# Patient Record
Sex: Male | Born: 1955
Health system: Southern US, Community
[De-identification: ages and names within clinical notes are randomized; demographics above are authoritative.]

## PROBLEM LIST (undated history)

## (undated) ENCOUNTER — Ambulatory Visit: Discharge status: Absent without leave | Payer: Managed Care, Other (non HMO)

## (undated) DIAGNOSIS — Z8489 Family history of other specified conditions: Secondary | ICD-10-CM

## (undated) DIAGNOSIS — Z951 Presence of aortocoronary bypass graft: Secondary | ICD-10-CM

## (undated) DIAGNOSIS — I739 Peripheral vascular disease, unspecified: Secondary | ICD-10-CM

## (undated) DIAGNOSIS — E119 Type 2 diabetes mellitus without complications: Secondary | ICD-10-CM

## (undated) DIAGNOSIS — C189 Malignant neoplasm of colon, unspecified: Secondary | ICD-10-CM

## (undated) DIAGNOSIS — I480 Paroxysmal atrial fibrillation: Secondary | ICD-10-CM

## (undated) DIAGNOSIS — K219 Gastro-esophageal reflux disease without esophagitis: Secondary | ICD-10-CM

## (undated) DIAGNOSIS — I10 Essential (primary) hypertension: Secondary | ICD-10-CM

## (undated) DIAGNOSIS — I350 Nonrheumatic aortic (valve) stenosis: Secondary | ICD-10-CM

## (undated) DIAGNOSIS — I25110 Atherosclerotic heart disease of native coronary artery with unstable angina pectoris: Secondary | ICD-10-CM

## (undated) DIAGNOSIS — I255 Ischemic cardiomyopathy: Secondary | ICD-10-CM

## (undated) DIAGNOSIS — T8859XA Other complications of anesthesia, initial encounter: Secondary | ICD-10-CM

## (undated) DIAGNOSIS — I340 Nonrheumatic mitral (valve) insufficiency: Secondary | ICD-10-CM

## (undated) DIAGNOSIS — I219 Acute myocardial infarction, unspecified: Secondary | ICD-10-CM

## (undated) DIAGNOSIS — E785 Hyperlipidemia, unspecified: Secondary | ICD-10-CM

## (undated) DIAGNOSIS — F1520 Other stimulant dependence, uncomplicated: Secondary | ICD-10-CM

## (undated) DIAGNOSIS — T4145XA Adverse effect of unspecified anesthetic, initial encounter: Secondary | ICD-10-CM

## (undated) HISTORY — DX: Malignant neoplasm of colon, unspecified: C18.9

## (undated) HISTORY — PX: CORONARY ARTERY BYPASS GRAFT: SHX141

## (undated) HISTORY — DX: Peripheral vascular disease, unspecified: I73.9

## (undated) HISTORY — DX: Hyperlipidemia, unspecified: E78.5

## (undated) HISTORY — DX: Ischemic cardiomyopathy: I25.5

## (undated) HISTORY — PX: OTHER SURGICAL HISTORY: SHX169

## (undated) HISTORY — DX: Paroxysmal atrial fibrillation: I48.0

---
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
## 2002-10-05 ENCOUNTER — Encounter: Payer: Self-pay | Admitting: Family Medicine

## 2002-10-05 ENCOUNTER — Ambulatory Visit (HOSPITAL_COMMUNITY): Admission: RE | Admit: 2002-10-05 | Discharge: 2002-10-05 | Payer: Self-pay | Admitting: Family Medicine

## 2002-11-25 ENCOUNTER — Ambulatory Visit (HOSPITAL_COMMUNITY): Admission: RE | Admit: 2002-11-25 | Discharge: 2002-11-25 | Payer: Self-pay | Admitting: Internal Medicine

## 2003-08-12 ENCOUNTER — Ambulatory Visit (HOSPITAL_COMMUNITY): Admission: RE | Admit: 2003-08-12 | Discharge: 2003-08-12 | Payer: Self-pay | Admitting: Family Medicine

## 2003-08-12 ENCOUNTER — Encounter: Payer: Self-pay | Admitting: Family Medicine

## 2007-02-25 ENCOUNTER — Ambulatory Visit (HOSPITAL_COMMUNITY): Admission: RE | Admit: 2007-02-25 | Discharge: 2007-02-25 | Payer: Self-pay | Admitting: *Deleted

## 2007-03-03 ENCOUNTER — Ambulatory Visit: Payer: Self-pay | Admitting: Surgery

## 2007-03-04 ENCOUNTER — Ambulatory Visit: Payer: Self-pay | Admitting: Surgery

## 2007-03-13 ENCOUNTER — Inpatient Hospital Stay (HOSPITAL_COMMUNITY): Admission: RE | Admit: 2007-03-13 | Discharge: 2007-03-22 | Payer: Self-pay | Admitting: Surgery

## 2007-03-13 ENCOUNTER — Ambulatory Visit: Payer: Self-pay | Admitting: Surgery

## 2007-04-08 ENCOUNTER — Encounter: Admission: RE | Admit: 2007-04-08 | Discharge: 2007-04-08 | Payer: Self-pay | Admitting: Surgery

## 2007-04-08 ENCOUNTER — Ambulatory Visit: Payer: Self-pay | Admitting: Surgery

## 2007-08-28 ENCOUNTER — Ambulatory Visit: Payer: Self-pay | Admitting: Surgery

## 2010-12-10 ENCOUNTER — Encounter: Payer: Self-pay | Admitting: Surgery

## 2011-04-03 NOTE — Assessment & Plan Note (Signed)
OFFICE VISIT   JUNEAU, Ian [name]  DOB:  February 18, 1956                                        August 28, 2007  CHART #:  [id number]   The patient had undergone coronary artery bypass grafting by Dr. Cyndia Bent  on 03/13/2007.  He had harvesting of the left radial artery at that  time.  Since surgery he has done very well and notes that he is now  walking up to 3 miles a day, and notes a definite improvement in his  overall physical status.  He noted yesterday a sharp stick in the mid  portion of  his left radial artery harvest site.  He came in to the  office today to have it checked.  On exam, a small metal clip has  protruded through the mid portion of his radial artery harvest site,  otherwise the site is well healed, there is no evidence of infection.   PHYSICAL EXAMINATION:  His blood pressure is 164/94, pulse is 112,  respiratory rate is 18, O2 sats 95%.  On exam his sternum is stable and  well healed.  As noted, he has a foreign body in the left forearm, a  small metal clip.  The arm was prepped with Betadine and draped  sterilely.  A 1% lidocaine was infiltrated locally and the clip was  removed without difficulty.  He was instructed in local wound care.   His blood pressure in the office today was slightly elevated.  I have  told him to make sure he is following up with his regular medical doctor  for close blood pressure monitoring.   Lanelle Bal, MD  Electronically Signed   EG/MEDQ  D:  08/28/2007  T:  08/28/2007  Job:  [id number]   cc:   Halford Chessman, M.D.  Richard A. Rollene Fare, M.D.

## 2011-04-06 NOTE — Consult Note (Signed)
NAME:  Ian [name], Ian [name]                        ACCOUNT NO.:  [id number]   MEDICAL RECORD NO.:  [id number]                   PATIENT TYPE:   LOCATION:                                       FACILITY:  [facility]   PHYSICIAN:  R. Garfield Cornea, M.D.              DATE OF BIRTH:  05-16-1956   DATE OF CONSULTATION:  10/20/2002  DATE OF DISCHARGE:                                   CONSULTATION   REASON FOR CONSULTATION:  Dysphagia, abnormal barium pill esophagram.   HISTORY OF PRESENT ILLNESS:  The patient is a 55 year old Caucasian  gentleman who presents today at the request of Dr. Orson Ape for further  evaluation of approximately one year's duration of dysphagia which has been  gradually worsening.  He has difficulty swallowing solids.  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  Estimated luminal diameter was 8-10 mm.  The patient also had typical heartburn symptoms and acid regurgitation for  which he takes over-the-counter antacids.  He has had these types of  regularly for at least two years.  Denies any nausea, vomiting, odynophagia,  abdominal pain, constipation, diarrhea, melena, or rectal bleeding.   CURRENT MEDICATIONS:  1. Glucotrol one b.i.d.  2. Lipitor 20 mg q.d.   ALLERGIES:  No known drug allergies.   PAST MEDICAL HISTORY:  1. Non-insulin-dependent diabetes mellitus.  2. Hypercholesterolemia.   PAST SURGICAL HISTORY:  None.   FAMILY HISTORY:  Brother died of lymphoma age 54.  No family history of  chronic GI illnesses or colorectal cancer.   SOCIAL HISTORY:  He has been married for 25 years and has three children.  He is employed with __________.  He quit smoking over 10 years ago.  Denies  any alcohol use.   REVIEW OF SYSTEMS:  Please see HPI for GI.  GENERAL:  Denies any weight  loss.  CARDIOPULMONARY:   Denies any chest pain, shortness of breath,  palpitations.   PHYSICAL EXAMINATION:  VITAL SIGNS:  Weight 209, height 5 feet 5 inches,  temperature 97.8, blood pressure 142/90, pulse 88.  GENERAL:  Pleasant, well-nourished, well-developed, moderately obese  Caucasian male in no acute distress.  SKIN:  Warm and dry.  No jaundice.  HEENT:  Pupils are equal, round, and reactive to light.  Conjunctivae are  pink.  Sclerae nonicteric.  Oropharyngeal mucosa moist and pink.  No  lesions, erythema, or exudate.  NECK:  No lymphadenopathy, thyromegaly, carotid bruits.  CHEST:  Lungs are clear to auscultation.  CARDIAC:  Regular rate and rhythm.  A 2/6 systolic ejection murmur heard  best in the upper precordium.  ABDOMEN:  Positive bowel sounds, obese, but symmetrical, soft, nontender.  No organomegaly.  There is a small, soft, round, mobile mass measuring about  the size of a dime in the right upper quadrant region consistent with a  lipoma.  He also has a small umbilical herniation which is easily reducible  and nontender.  EXTREMITIES:  No edema.   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  He has chronic gastroesophageal reflux disease symptoms and takes  over-the-counter antacids.  Suspect that he probably has a peptic stricture.  He needs an upper endoscopy with dilatation.  He also has a soft heart  murmur on examination today with no prior history.  Will provide SBE  prophylaxis.  I have asked him to follow up with Dr. Orson Ape regarding any  need for possible echocardiogram.   PLAN:  1. EGD with dilatation in near future.  2. Will provide SBE prophylaxis.  3. Begin Aciphex 20 mg p.o. q.d.  Four weeks of samples given.  4. The patient will follow up with Dr. Orson Ape with regards to heart murmur     and any need for possible echocardiogram.   I would like to thank Dr.  Orson Ape for allowing [place] to take part in the care  of this patient.     Neil Crouch, Richelle Ito, M.D.    LL/MEDQ  D:  10/20/2002  T:  10/20/2002  Job:  [id number]   cc:   Leonides Grills, M.D.  [address] [id number]  Fax: [phone]

## 2011-04-06 NOTE — Op Note (Signed)
NAMEMURVIN, Ian [name]              ACCOUNT NO.:  [id number]   MEDICAL RECORD NO.:  [id number]          PATIENT TYPE:  INP   LOCATION:  2314                         FACILITY:  [facility]   PHYSICIAN:  Gilford Raid, M.D.     DATE OF BIRTH:  1956-10-26   DATE OF PROCEDURE:  03/13/2007  DATE OF DISCHARGE:                               OPERATIVE REPORT   PREOPERATIVE DIAGNOSIS:  Severe three vessel coronary disease.   POSTOPERATIVE DIAGNOSIS:  Severe three vessel coronary disease.   OPERATIVE PROCEDURE:  Median sternotomy, extracorporeal circulation,  coronary artery bypass graft surgery x4 using a left internal mammary  artery graft to the left anterior descending coronary artery, with a  left radial artery graft to the posterior descending branch of the right  coronary, and saphenous vein graft to the second and third marginal  branches of the left circumflex coronary artery.  Endoscopic vein  harvesting from the left leg.   ATTENDING SURGEON:  Gilford Raid, M.D.   ASSISTANT:  John Giovanni, P.A.-C.   ANESTHESIA:  General endotracheal.   CLINICAL HISTORY:  This patient is a 55 year old gentleman with a  history of diabetes, hypertension, and hyperlipidemia as well as a  strong family history of coronary artery disease who has been very  active over the past year going to the gym 4-5 days a week and walking  on a treadmill.  He had no problems until about 6-8 weeks ago when he  reported exercising and developing aching in his arms bilaterally.  He  continued to have intermittent episodes over the next few weeks.  Given  his history, he underwent cardiac catheterization on February 26, 2007,  which showed significant three vessel disease.  The LAD had 90% proximal  stenosis as well as a 60% mid vessel stenosis.  There was a small  intermediate that had no significant disease.  The left circumflex is co-  dominant with a 60% ostial stenosis.  There was a second marginal branch  that had a  long 80% proximal stenosis.  There was a 40% stenosis in the  AV groove portion of the left circumflex before the third marginal or  posterolateral branch.  The right coronary had a nd 60% ostial stenosis  and a 70-80% mid vessel stenosis.  Left ventricular ejection fraction  was greater than 60% with no mitral regurgitation.  After review of the  angiogram and examination of the patient, it was felt that coronary  artery bypass graft surgery is the best treatment.  We obtained  preoperative arterial Dopplers of the upper extremities which showed  normal flow in the superficial palmar arch with radial and ulnar  compression.  I felt the best operation would be to use his left  internal mammary graft and a left radial artery graft as well as some  saphenous vein.  I did not use bilateral mammaries because of his  diabetes.   I discussed the operative procedure with the patient and his wife  including alternatives, benefits, and risks including but not limited to  bleeding, blood transfusion, infection, stroke, myocardial infarction,  graft  failure, paresthesias in the left arm from radial artery harvest,  organ failure, and death.  He understood and agreed to proceed.   OPERATIVE PROCEDURE:  The patient was taken to the operating room and  placed on the table in a supine position.  After induction of general  endotracheal anesthesia, a Foley catheter was placed in the bladder  using sterile technique.  The patient was positioned supine with the  left arm out on an arm board.  Then, the chest, abdomen and both lower  extremities as well as the left arm were prepped and draped in the  sterile field in the usual manner.  Then, the left radial artery was  harvested through a longitudinal incision over the artery.  Prior to  dividing the sub-branches, an atraumatic vascular clamp was placed  across the radial artery at the wrist and there was good Doppler flow  beyond the clamp indicating  good flow through the ulnar artery and the  superficial palmar arch.  I felt comfortable that the radial artery  could be harvested.  The side branches were then divided using the  harmonic scalpel.  The artery was ligated proximally and distally with a  2-0 silk suture ligature and divided.  It was flushed with papaverine  saline solution.  The side branches were all clipped.  This was a medium  caliber good quality vessel.  Then, there was complete hemostasis in the  wound.  The fascia was closed with continuous 2-0 Vicryl suture.  The  subcutaneous tissue was closed with continuous 2-0 Vicryl and the skin  with 3-0 Vicryl subcuticular skin closure.  The sponge, needle, and  instrument counts were correct at this point.  A dry sterile dressing  was applied over the incision.  The left arm was then repositioned at  the patient's left side.   Then, the chest was opened through a median sternotomy incision.  The  pericardium was opened in the midline.  Examination of the heart showed  good ventricular contractility.  The ascending aorta had no palpable  plaques in it.  Then, the left internal mammary artery was harvested  from the chest wall as a pedicle graft.  This was a medium caliber  vessel with excellent blood flow through it.  At the same time, a  segment of greater saphenous vein was harvested from the left leg using  endoscopic vein harvest technique.  This vein was of small to medium  caliber and good quality.  We initially examined the saphenous vein  adjacent to the right knee but this appeared to be very tiny and not  suitable.  I could not find any other vein present there.   Then, the patient was heparinized and when an adequate activated  clotting time was achieved, the distal ascending aorta was cannulated  using a 20-French aortic cannula for arterial inflow.  Venous outflow  was achieved using a two stage venous cannula through the right atrial appendage.  An antegrade  cardioplegia and vent cannula was inserted in  the aortic root.  The patient was placed on cardiopulmonary bypass and  distal coronaries identified.  The LAD was a large graftable vessel with  some proximal disease but no mid or distal disease.  The first marginal  branch or intermediate branch was relatively small.  The second marginal  branch was a large graftable vessel.  A third marginal branch was also a  graftable vessel.  The right coronary gave off a large posterior  descending branch and a very small posterolateral branch.   Then, the aorta was crossclamped and 500 mL of cold blood antegrade  cardioplegia was administered in the aortic root with quick arrest the  heart.  Systemic hypothermia to 28 degrees centigrade and topical  hypothermia with iced saline was used.  A temperature probe was placed  in the septum and insulating pad in the pericardium.  Additional doses  of cardioplegia were given at 20 minute intervals to maintain myocardial  temperature at around 10 degrees centigrade.   The first distal anastomosis was performed to the second obtuse marginal  branch.  The internal diameter was about 1.6 mm.  The conduit used was a  segment of greater saphenous vein and anastomosis performed in an end-to-  side manner using continuous 7-0 Prolene suture.  Flow was noted through  the graft and was excellent.   The second distal anastomosis was performed to the third marginal  branch.  The internal diameter of this vessel was also about 1.6 mm.  The conduit used was a second segment of greater saphenous vein and the  anastomosis was performed in an end-to-side manner continuous 7-0  Prolene suture.  Flow was noted through the graft and was excellent.   The third distal anastomosis was performed to the posterior descending  coronary.  The internal diameter of this vessel was about 1.75 mm.  The  conduit used was the left radial artery graft.  This was anastomosed in  an  end-to-side manner using continuous 8-0 Prolene suture.  Flow was  noted through the graft and was excellent.   The fourth distal anastomosis was performed to the distal portion of the  left anterior descending coronary.  The internal diameter of this vessel  was about 2 mm.  The conduit used was the left internal mammary graft.  This was brought through an opening in the left pericardium anterior to  the phrenic nerve.  It was anastomosed to the LAD in an end-to-side  manner using continuous 8-0 Prolene suture.  The pedicle was sutured to  the epicardium with 6-0 Prolene sutures.  The patient was rewarmed to 37  degrees centigrade.  With the crossclamp in place, the three proximal  anastomoses were performed directly to the aortic root in an end-to-side  manner using continuous 6-0 Prolene suture for the vein grafts and 7-0  Prolene for the radial artery graft.   Then, the clamp was removed from mammary pedicle.  There was rapid warming of the ventricular septum and return of spontaneous ventricular  fibrillation.  The crossclamp was removed with a time of 94 minutes.  The patient spontaneously converted to sinus rhythm.  The proximal and  distal anastomoses appeared hemostatic and the lie of the grafts  satisfactory.  Graft markers were placed around the proximal  anastomoses.  Two temporary right ventricular and right atrial pacing  wires were placed and brought through the skin.   When the patient had rewarmed to 37 degrees centigrade, he was weaned  from cardiopulmonary bypass on no inotropic agents.  Total bypass time  was 112 minutes.  Cardiac function appeared excellent with a cardiac  output of 5 liters per minute.  Protamine was given and the venous and  aortic cannulae were removed without difficulty.  Hemostasis was  achieved.  Three chest tubes were placed, two in the posterior  pericardium, one in the left pleural space, and one in the anterior  mediastinum.  The  pericardium was loosely  reapproximated over the heart.  The sternum was closed with #6 stainless wires.  The fascia was closed  with continuous #1 Vicryl suture.  The subcutaneous tissue was closed  with continuous 2-0 Vicryl and the skin with 3-0 Vicryl subcuticular  closure.  Lower extremity vein harvest sites were closed in layers in a  similar manner.  Sponge, needle and instrument counts were correct  according to the scrub nurse.  A dry sterile dressing was applied over  the incisions and around the chest tubes which were hooked to Pleur-Evac  suction.  The patient remained hemodynamically stable and was  transferred to the SICU in guarded but stable condition.      Gilford Raid, M.D.  Electronically Signed     BB/MEDQ  D:  03/13/2007  T:  03/13/2007  Job:  [id number]   cc:   Delfino Lovett A. Rollene Fare, M.D.

## 2011-04-06 NOTE — Discharge Summary (Signed)
Ian [name], Ian [name]              ACCOUNT NO.:  [id number]   MEDICAL RECORD NO.:  [id number]          PATIENT TYPE:  INP   LOCATION:  2016                         FACILITY:  [facility]   PHYSICIAN:  Gilford Raid, M.D.     DATE OF BIRTH:  1956/03/26   DATE OF ADMISSION:  03/13/2007  DATE OF DISCHARGE:  03/17/2007                               DISCHARGE SUMMARY   PRIMARY ADMITTING DIAGNOSIS:  Severe three-vessel coronary artery  disease.   ADDITIONAL/DISCHARGE DIAGNOSES:  1. Severe three-vessel coronary artery disease.  2. Type 2 diabetes mellitus.  3. Hypertension.  4. Hyperlipidemia.  5. Postoperative tachycardia.   PROCEDURES PERFORMED:  1. Coronary artery bypass grafting x4 (left internal mammary artery to      the LAD, left radial artery to the posterior descending, saphenous      vein graft sequentially to the second and third circumflex marginal      branches).  2. Endoscopic vein harvest, left leg.   HISTORY:  The patient is a 55 year old white male who 6-8 weeks ago  developed exercise intolerance and aching in his arms bilaterally.  This  continued intermittently, and he was seen by Dr. Gerarda Fraction who subsequently  referred him to Dr. Rollene Fare for further evaluation.  Because of his  symptoms as well as his family history of coronary artery disease and  his own past medical history of diabetes, hypertension, and  hyperlipidemia, he underwent cardiac catheterization on February 26, 2007  which showed severe three-vessel coronary artery disease with a left  ventricular ejection fraction of greater than 60%.  He was referred to  Dr. Gilford Raid for consideration of surgical revascularization.  Dr.  Cyndia Bent saw him as an outpatient and reviewed his coronary angiograms and  felt that his best course of treatment would be to proceed with surgical  revascularization at this time.  He explained the risks, benefits, and  alternatives of the procedure to the patient, and he agreed to  proceed  with surgery.   HOSPITAL COURSE:  He was admitted to [facility] on March 13, 2007.  He he was taken to the operating room and underwent CABG x4, as  described in detail above, performed by Dr. Cyndia Bent.  He tolerated the  procedure well and was transferred to the SICU in stable condition.  He  was able to be extubated shortly after surgery and was hemodynamically  stable and doing well on postop day #1.  He was able to be transferred  to the floor late in the day on postop day #1.  He was restarted on an  ACE inhibitor and beta-blocker for his history of hypertension and was  started on Imdur for his radial artery graft.  Once his p.o. intake had  improved, he was restarted on his home diabetes medications and in the  interim was maintained on Lantus.  He has had some problems with  tachycardia postoperatively, and his beta-blocker dose has been titrated  upward accordingly.  He is otherwise doing very well.  He is tolerating  a regular diet and is having normal bowel and bladder  function.  His  blood pressure has been well-controlled, and he has remained afebrile.  He has been diuresed back down to within 1 kg of his preoperative  weight.  He is ambulating the halls without difficulty.  His incisions  are all healing well.  It is felt that if he continues to remain stable  over the next 24 hours, he will hopefully be ready for discharge home on  March 17, 2007.   LABORATORY DATA:  His most recent labs showed a hemoglobin of 9.6,  hematocrit 27.3, white count 14.4, platelets 163.  Sodium 138, potassium  3.7 which has been replaced, BUN 13, creatinine 0.84.   DISCHARGE MEDICATIONS:  1. Aspirin 325 mg daily.  2. Lopressor 50 mg q.8h.  3. Altace 10 mg daily  4. Vytorin 10/80 mg daily.  5. Imdur 30 mg daily.  6. Protonix 40 mg daily.  7. ACTOplus met 15/500 b.i.d.  8. Tylox one to two q.4h. p.r.n. for pain.   DISCHARGE INSTRUCTIONS:  He is asked to refrain from  driving, heavy  lifting, or strenuous activity.  He may continue ambulating daily and  using his incentive spirometer.  He may shower daily and clean his  incisions with soap and water.  He will continue a low-fat, low-sodium  diabetic restricted diet.   DISCHARGE FOLLOWUP:  He will need to make an appointment to see Dr.  Rollene Fare in 2 weeks.  He will then follow up with Dr. Cyndia Bent in 3 weeks  and will have a chest x-ray prior to this appointment at [facility].  In the interim if he experiences problems or has  questions, he is asked to contact our office immediately.      Suzzanne Cloud, P.A.      Gilford Raid, M.D.  Electronically Signed    GC/MEDQ  D:  03/16/2007  T:  03/16/2007  Job:  [id number]   cc:   Sherrilee Gilles. Gerarda Fraction, MD  Richard A. Rollene Fare, M.D.

## 2011-04-06 NOTE — Discharge Summary (Signed)
Ian [name], Ian [name]              ACCOUNT NO.:  [id number]   MEDICAL RECORD NO.:  [id number]          PATIENT TYPE:  INP   LOCATION:  2016                         FACILITY:  [facility]   PHYSICIAN:  Gilford Raid, M.D.     DATE OF BIRTH:  1956-09-15   DATE OF ADMISSION:  03/13/2007  DATE OF DISCHARGE:  03/22/2007                               DISCHARGE SUMMARY   ADDENDUM   The patient was previously felt to be possibly ready for discharge on or  about March 17, 2007.  He, however, continued to have some elevation in  his heart rate and blood pressure.  This necessitated changing of his  medical regimen.  His beta-blocker was increased and additionally he was  started on Cardizem.  He remained at a baseline tachycardia even at  rest.  TSH was checked and this was within normal range.  The patient  was felt possibly to require consideration for evaluation for possible  pheochromocytoma due to the persistent tachycardia and elevation in his  blood pressure.  He has had a 24-hour catecholamine and metanephrine  tests and results are currently pending at this time.  Additionally, he  has had a CT scan and this reveals no adrenal masses.  His heart rate  has improved somewhat.  His blood pressure is also more stable.  His  overall status is felt tentatively to be stable for discharge in the  morning of Mar 22, 2007.   DISCHARGE MEDICATIONS:  1. Aspirin 325 mg daily.  2. Lopressor 100 mg b.i.d.  3. Altace 10 mg daily.  4. Vytorin 10/80 one daily.  5. Protonix 40 mg daily.  6. Actos twice daily.  7. Cardizem CD 240 mg daily.   SPECIAL INSTRUCTIONS:  The patient received written instructions  regarding medications, activity, diet, wound care and follow-up.   FOLLOW UP:  Followup will be with Dr. Cyndia Bent in 3 weeks and an  appointment will be called to the patient.  Additionally, he is  instructed to obtain a chest x-ray and see Dr. Rollene Fare in 2 weeks.   CONDITION ON DISCHARGE:  Stable,  improving.   DISCHARGE DIAGNOSES:  1. Severe coronary artery disease, three-vessel, now status post      revascularization.  2. Mild postoperative anemia most recent hematocrit 32% on March 19, 2007.  3. Adult-onset diabetes.  4. Hypertension.  5. Hyperlipidemia.  6. Persistent tachycardia of uncertain etiology.  7. Exogenous obesity.   For further details of this hospitalization, please see the previously  dictated summary.      John Giovanni, P.A.-C.      Gilford Raid, M.D.  Electronically Signed    WEG/MEDQ  D:  03/21/2007  T:  03/21/2007  Job:  [id number]   cc:   Delfino Lovett A. Rollene Fare, M.D.

## 2011-04-06 NOTE — Op Note (Signed)
NAME:  [name], Ian L                        ACCOUNT NO.:  [id number]   MEDICAL RECORD NO.:  [id number]                   PATIENT TYPE:  AMB   LOCATION:  DAY                                  FACILITY:  [facility]   PHYSICIAN:  R. Garfield Cornea, M.D.              DATE OF BIRTH:  23-May-1956   DATE OF PROCEDURE:  11/25/2002  DATE OF DISCHARGE:                                 OPERATIVE REPORT   PROCEDURE PERFORMED:  Esophagogastroduodenoscopy.   ENDOSCOPIST:  Bridgette Habermann, M.D.   INDICATIONS FOR PROCEDURE:  The patient is a 55 year old gentleman with a  one-year history of esophageal dysphagia.  Barium-filled esophagogram  demonstrated a stricture in the distal thoracic esophagus and he has  frequent reflux symptoms.  He was started on Aciphex 20 mg orally daily  recently. He was also found to have a systolic ejection murmur.  He is  seeing Dr. Everette Rank for consideration of echocardiogram.  EGD with possible  esophageal dilation as a prep was discussed with the patient at length.  Potential risks, benefits and alternatives have been reviewed and questions  answered.  Please see documentation in medical record [facility]AT.  Because he does  have a systolic murmur, he did receive SBE prophylactic antibiotics.   DESCRIPTION OF PROCEDURE:  Oxygen saturations, blood pressure, pulse and  respirations were monitored entirety of the procedure and conscious sedation  with Versed 3 mg IV and Demerol 75 mg IV, Cetacaine spray for topical  oropharyngeal anesthesia.  Ampicillin 2 gm IV, gentamicin 120 mg IV prior to  the procedure.   INSTRUMENT USED:  Olympus video gastroscope.   FINDINGS:  Examination of tubular esophagus revealed Schatzki's' ring.  There was a single distal esophageal erosion.  There was no evidence of  Barrett's esophagus or other mucosal abnormality.  EG junction was easily  traversed _______. Gastric cavity was emptied and insufflated well with air.  Thorough examination of  gastric mucosa including a retroflexed view of the  proximal stomach, esophagogastric junction demonstrated two 5 mm prepyloric  ulcers with surrounding superficial erosions.  There did not appear to be an  infiltrating process.  The pylorus was patent and easily traversed.   Duodenum:  Examination of the bulb and second portion revealed similar  findings as in the antrum with erosions only in the bulb.  There was no  ulcer.  D2 appeared normal.   THERAPY/DIAGNOSTIC MANEUVERS PERFORMED:  1. A 46 French ______ dilator was passed for insertion without resistance,     without blood in the dilator.  Looking back revealed the ring had been     ruptured with a slight amount of blood at the EG junction.  There were no     apparent complications related to passage of the dilator. Subsequently     one of the ulcers in the antrum was biopsied for histologic study.   The patient tolerated the procedure well and  was reacted in endoscopy.    IMPRESSION:  1. Prominent Schatzki's' ring, status post dilation as described above.     Single distal esophageal erosion consistent with erosive reflux     esophagitis.  2. Two small prepyloric ulcers with surrounding erosions of uncertain     significance biopsied.  They appeared to be benign lesions. The remainder     of the gastric mucosa appeared normal.  3. Bulbar erosions otherwise bulb and second portion appeared normal.   RECOMMENDATIONS:  1. Continue Aciphex 20 mg orally every day.  2. Follow-up on path.  3. Helicobacter pylori serologies.  4. Further recommendations to follow.                                                Bridgette Habermann, M.D.    RMR/MEDQ  D:  11/25/2002  T:  11/25/2002  Job:  [id number]   cc:   Leonides Grills, M.D.  [address] [id number]  Fax: [phone]

## 2011-09-03 ENCOUNTER — Telehealth: Payer: Self-pay

## 2011-09-03 NOTE — Telephone Encounter (Signed)
Called, many rings and no answer.

## 2011-09-04 NOTE — Telephone Encounter (Signed)
Called, line busy.  

## 2011-09-05 NOTE — Telephone Encounter (Signed)
Letter mailed to pt to call.  

## 2011-12-14 ENCOUNTER — Telehealth: Payer: Self-pay

## 2011-12-14 NOTE — Telephone Encounter (Signed)
Called, many rings and no answer.

## 2011-12-17 NOTE — Telephone Encounter (Signed)
Called, many rings and no answer. Mailing a letter to call.  

## 2013-01-09 ENCOUNTER — Encounter (HOSPITAL_COMMUNITY): Payer: Self-pay | Admitting: *Deleted

## 2013-01-09 ENCOUNTER — Observation Stay (HOSPITAL_COMMUNITY)
Admission: EM | Admit: 2013-01-09 | Discharge: 2013-01-10 | Disposition: A | Discharge status: Home / Self Care | Payer: Managed Care, Other (non HMO) | Attending: Internal Medicine | Admitting: Internal Medicine

## 2013-01-09 DIAGNOSIS — G4733 Obstructive sleep apnea (adult) (pediatric): Secondary | ICD-10-CM | POA: Diagnosis present

## 2013-01-09 DIAGNOSIS — R110 Nausea: Secondary | ICD-10-CM

## 2013-01-09 DIAGNOSIS — Z951 Presence of aortocoronary bypass graft: Secondary | ICD-10-CM

## 2013-01-09 DIAGNOSIS — I2510 Atherosclerotic heart disease of native coronary artery without angina pectoris: Secondary | ICD-10-CM

## 2013-01-09 DIAGNOSIS — D72829 Elevated white blood cell count, unspecified: Secondary | ICD-10-CM | POA: Insufficient documentation

## 2013-01-09 DIAGNOSIS — R002 Palpitations: Principal | ICD-10-CM

## 2013-01-09 DIAGNOSIS — E119 Type 2 diabetes mellitus without complications: Secondary | ICD-10-CM

## 2013-01-09 DIAGNOSIS — M79609 Pain in unspecified limb: Secondary | ICD-10-CM | POA: Insufficient documentation

## 2013-01-09 DIAGNOSIS — F1520 Other stimulant dependence, uncomplicated: Secondary | ICD-10-CM

## 2013-01-09 DIAGNOSIS — R0789 Other chest pain: Secondary | ICD-10-CM

## 2013-01-09 DIAGNOSIS — E785 Hyperlipidemia, unspecified: Secondary | ICD-10-CM

## 2013-01-09 DIAGNOSIS — R61 Generalized hyperhidrosis: Secondary | ICD-10-CM

## 2013-01-09 DIAGNOSIS — E669 Obesity, unspecified: Secondary | ICD-10-CM

## 2013-01-09 DIAGNOSIS — I10 Essential (primary) hypertension: Secondary | ICD-10-CM

## 2013-01-09 HISTORY — DX: Other stimulant dependence, uncomplicated: F15.20

## 2013-01-09 HISTORY — DX: Presence of aortocoronary bypass graft: Z95.1

## 2013-01-09 HISTORY — DX: Type 2 diabetes mellitus without complications: E11.9

## 2013-01-09 HISTORY — DX: Essential (primary) hypertension: I10

## 2013-01-09 NOTE — ED Notes (Signed)
Pt c/o feeling diaphoretic and feels as if his heart is fluttering x 1 hr.

## 2013-01-10 ENCOUNTER — Encounter (HOSPITAL_COMMUNITY): Payer: Self-pay | Admitting: Internal Medicine

## 2013-01-10 ENCOUNTER — Emergency Department (HOSPITAL_COMMUNITY): Payer: Managed Care, Other (non HMO)

## 2013-01-10 DIAGNOSIS — R002 Palpitations: Secondary | ICD-10-CM

## 2013-01-10 DIAGNOSIS — E1169 Type 2 diabetes mellitus with other specified complication: Secondary | ICD-10-CM | POA: Diagnosis present

## 2013-01-10 DIAGNOSIS — I25110 Atherosclerotic heart disease of native coronary artery with unstable angina pectoris: Secondary | ICD-10-CM

## 2013-01-10 DIAGNOSIS — I10 Essential (primary) hypertension: Secondary | ICD-10-CM

## 2013-01-10 DIAGNOSIS — R0789 Other chest pain: Secondary | ICD-10-CM

## 2013-01-10 DIAGNOSIS — F1520 Other stimulant dependence, uncomplicated: Secondary | ICD-10-CM

## 2013-01-10 DIAGNOSIS — E669 Obesity, unspecified: Secondary | ICD-10-CM

## 2013-01-10 DIAGNOSIS — E1165 Type 2 diabetes mellitus with hyperglycemia: Secondary | ICD-10-CM | POA: Diagnosis present

## 2013-01-10 DIAGNOSIS — E119 Type 2 diabetes mellitus without complications: Secondary | ICD-10-CM

## 2013-01-10 DIAGNOSIS — G4733 Obstructive sleep apnea (adult) (pediatric): Secondary | ICD-10-CM | POA: Diagnosis present

## 2013-01-10 HISTORY — DX: Atherosclerotic heart disease of native coronary artery with unstable angina pectoris: I25.110

## 2013-01-10 HISTORY — DX: Other stimulant dependence, uncomplicated: F15.20

## 2013-01-10 LAB — TROPONIN I
Troponin I: <0.3 ng/mL (ref <0.30)
Troponin I: <0.3 ng/mL (ref <0.30)

## 2013-01-10 LAB — CBC WITH DIFFERENTIAL/PLATELET
Basophils Absolute: 0 10*3/uL (ref 0.0–0.1)
Eosinophils Relative: 1 % (ref 0–5)
Lymphocytes Relative: 26 % (ref 12–46)
Lymphs Abs: 3.1 10*3/uL (ref 0.7–4.0)
Neutro Abs: 7.7 10*3/uL (ref 1.7–7.7)
Neutrophils Relative %: 64 % (ref 43–77)
Platelets: 282 10*3/uL (ref 150–400)
RBC: 4.62 MIL/uL (ref 4.22–5.81)
RDW: 13.1 % (ref 11.5–15.5)
WBC: 12.2 10*3/uL — ABNORMAL HIGH (ref 4.0–10.5)

## 2013-01-10 LAB — BASIC METABOLIC PANEL
CO2: 28 mEq/L (ref 19–32)
Calcium: 10 mg/dL (ref 8.4–10.5)
Chloride: 100 mEq/L (ref 96–112)
Potassium: 3.7 mEq/L (ref 3.5–5.1)
Sodium: 141 mEq/L (ref 135–145)

## 2013-01-10 LAB — GLUCOSE, CAPILLARY: Glucose-Capillary: 138 mg/dL — ABNORMAL HIGH (ref 70–99)

## 2013-01-10 LAB — HEPATIC FUNCTION PANEL
ALT: 22 U/L (ref 0–53)
AST: 17 U/L (ref 0–37)
Alkaline Phosphatase: 88 U/L (ref 39–117)
Bilirubin, Direct: 0.1 mg/dL (ref 0.0–0.3)

## 2013-01-10 LAB — HEMOGLOBIN A1C: Mean Plasma Glucose: 186 mg/dL — ABNORMAL HIGH (ref <117)

## 2013-01-10 MED ORDER — INSULIN ASPART 100 UNIT/ML ~~LOC~~ SOLN: 0.0000 [IU] | Freq: Every day | SUBCUTANEOUS | Status: DC

## 2013-01-10 MED ORDER — ONDANSETRON HCL 4 MG/2ML IJ SOLN: 4.0000 mg | INTRAMUSCULAR | Status: DC | PRN

## 2013-01-10 MED ORDER — NIACIN ER (ANTIHYPERLIPIDEMIC) 500 MG PO TBCR
500.0000 mg | EXTENDED_RELEASE_TABLET | Freq: Every day | ORAL | Status: DC
  Filled 2013-01-10 (×2): qty 1

## 2013-01-10 MED ORDER — NITROGLYCERIN 0.4 MG SL SUBL: 0.4000 mg | SUBLINGUAL_TABLET | SUBLINGUAL | Status: DC | PRN

## 2013-01-10 MED ORDER — ASPIRIN EC 81 MG PO TBEC
81.0000 mg | DELAYED_RELEASE_TABLET | Freq: Every day | ORAL | Status: DC
  Administered 2013-01-10: 81 mg via ORAL
  Filled 2013-01-10: qty 1

## 2013-01-10 MED ORDER — RAMIPRIL 10 MG PO CAPS
10.0000 mg | ORAL_CAPSULE | Freq: Every day | ORAL | Status: DC
  Administered 2013-01-10: 10 mg via ORAL
  Filled 2013-01-10: qty 1

## 2013-01-10 MED ORDER — METOPROLOL TARTRATE 1 MG/ML IV SOLN: 5.0000 mg | Freq: Four times a day (QID) | INTRAVENOUS | Status: DC | PRN

## 2013-01-10 MED ORDER — EZETIMIBE 10 MG PO TABS: 10.0000 mg | ORAL_TABLET | Freq: Every day | ORAL | Status: DC

## 2013-01-10 MED ORDER — METOPROLOL TARTRATE 50 MG PO TABS
50.0000 mg | ORAL_TABLET | Freq: Two times a day (BID) | ORAL | Status: DC
  Administered 2013-01-10: 50 mg via ORAL
  Filled 2013-01-10: qty 1

## 2013-01-10 MED ORDER — POLYETHYLENE GLYCOL 3350 17 G PO PACK: 17.0000 g | PACK | Freq: Every day | ORAL | Status: DC | PRN

## 2013-01-10 MED ORDER — SORBITOL 70 % SOLN: 30.0000 mL | Freq: Every day | Status: DC | PRN

## 2013-01-10 MED ORDER — SIMVASTATIN 20 MG PO TABS: 80.0000 mg | ORAL_TABLET | Freq: Every day | ORAL | Status: DC

## 2013-01-10 MED ORDER — PANTOPRAZOLE SODIUM 40 MG PO TBEC
40.0000 mg | DELAYED_RELEASE_TABLET | Freq: Every day | ORAL | Status: DC
  Administered 2013-01-10: 40 mg via ORAL
  Filled 2013-01-10: qty 1

## 2013-01-10 MED ORDER — INSULIN ASPART 100 UNIT/ML ~~LOC~~ SOLN
0.0000 [IU] | Freq: Three times a day (TID) | SUBCUTANEOUS | Status: DC
  Administered 2013-01-10 (×3): 1 [IU] via SUBCUTANEOUS

## 2013-01-10 MED ORDER — EZETIMIBE-SIMVASTATIN 10-80 MG PO TABS: 1.0000 | ORAL_TABLET | Freq: Every day | ORAL | Status: DC

## 2013-01-10 MED ORDER — ENOXAPARIN SODIUM 40 MG/0.4ML ~~LOC~~ SOLN
40.0000 mg | SUBCUTANEOUS | Status: DC
  Administered 2013-01-10: 40 mg via SUBCUTANEOUS
  Filled 2013-01-10: qty 0.4

## 2013-01-10 MED ORDER — SODIUM CHLORIDE 0.9 % IJ SOLN
3.0000 mL | Freq: Two times a day (BID) | INTRAMUSCULAR | Status: DC
  Administered 2013-01-10: 3 mL via INTRAVENOUS

## 2013-01-10 MED ORDER — ASPIRIN EC 81 MG PO TBEC: 81.0000 mg | DELAYED_RELEASE_TABLET | Freq: Every day | ORAL | Status: DC

## 2013-01-10 MED ORDER — ACETAMINOPHEN 325 MG PO TABS: 650.0000 mg | ORAL_TABLET | ORAL | Status: DC | PRN

## 2013-01-10 MED ORDER — INSULIN GLARGINE 100 UNIT/ML ~~LOC~~ SOLN: 50.0000 [IU] | Freq: Every day | SUBCUTANEOUS | Status: DC

## 2013-01-10 NOTE — Discharge Summary (Signed)
Physician Discharge Summary  Ian [name] [id number] DOB: 01-03-1956 DOA: 01/09/2013  PCP: Leonides Grills, MD  Admit date: 01/09/2013 Discharge date: 01/10/2013  Time spent: Greater than 30 minutes  Recommendations for Outpatient Follow-up:  1. The patient was instructed on decreasing his caffeine intake. 2. The patient was instructed to avoid skipping doses of diltiazem and metoprolol XL. He was advised to hold ramipril and hydrochlorothiazide if he felt that his blood pressure was too low or if he felt dizzy when he stood up. 3. The patient would benefit from an outpatient sleep study and Holter monitoring This will be deferred to his primary care providers. 4. TSH result pending at the time of discharge.  Discharge Diagnoses:  1. Recurrent palpitations. Differential diagnoses included excessive caffeine consumption and rebound tachyarrhythmia from missing a dose of metoprolol XL. Other etiologies entertained included hypoglycemia though not confirmed and obstructive sleep apnea though not confirmed. 2. Chest tightness with associated left arm numbness. Myocardial infarction ruled out. 3. Coronary artery disease, status post CABG in 2008. 4. Hypertension. Stable. 5. Possible obstructive sleep apnea, but not definitive given no diagnostic sleep study. 6. Type 2 diabetes mellitus. 7. Hyperlipidemia. 8. Mild leukocytosis with no evidence of infection. 9. Excessive caffeine intake, equivalent to 2 pots of coffee daily.   Discharge Condition: Improved.  Diet recommendation: Heart healthy and carbohydrate modified.  Filed Weights   01/09/13 2338  Weight: 92.987 kg (205 lb)    History of present illness:  The patient is a 57 year old man with a history significant for coronary artery disease, status post CABG in 2008, hypertension, hyperlipidemia, and type 2 diabetes mellitus, who presented to the emergency department on 01/09/2013 with a chief complaint of chest palpitations,  transient chest tightness, left arm numbness, shortness of breath, and sweatiness. In the emergency department, he was afebrile and hemodynamically stable. He was oxygenating 97% on room air. His heart rate was in the 80s. His blood pressure was 167/103. His EKG revealed normal sinus rhythm with a heart rate of 91 beats per minute, but no ST or T wave abnormalities and no PVCs or PACs. His chest x-ray revealed no acute cardiopulmonary disease. His lab data were significant for a normal troponin I., glucose of 189, and WBC of 12.2. He was admitted for further evaluation and management.  Hospital Course:  During the initial assessment, his home medications were not confirmed. It was believed that he had been taking metoprolol once a day at home. Therefore, the dose of metoprolol was increased to 50 mg twice a day to ameliorate his palpitations.. However, once the pharmacy tech confirmed his home medications with the pharmacy and with his wife, it was noted that he takes metoprolol XL 75 mg twice a day and diltiazem 240 mg daily at home. In addition, he takes hydrochlorothiazide and ramipril for treatment of hypertension. He has a history of persistent tachycardia dating back to 2008 following his CABG and was therefore started on treatment with diltiazem and metoprolol at that time. The patient was subtotally restarted on metoprolol. For further evaluation of his symptoms, a number of studies were ordered. His serum potassium was within normal limits at 3.7. His blood magnesium level was within normal limits at 2.1. His troponin I was negative x3. The result of his TSH was pending at the time of discharge. His followup EKG was normal, revealing normal sinus rhythm without any ST or T wave abnormalities and with a heart rate of 87 beats per minute.  During the  hospitalization, per telemetry, there were no tachyarrhythmias. The patient's chest pain and palpitations resolved.  The etiology of his perceived  palpitations was not clearly elucidated. However the differential diagnoses included transient hypoxia from obstructive sleep apnea (the patient has a history of snoring), hypoglycemia (his capillary blood glucose was nearly 200 on admission ) or rebound tachyarrhythmia because he had missed taking one dose of metoprolol XL. The most plausible etiology was the patient's recent increase in caffeine consumption. Per the patient and his wife, he drinks more than 2 pots of coffee in excess of 80 ounces or more daily.  The patient was instructed to decrease the intake of his coffee consumption by 1 cup every other day until he was down to approximately one half of what he had been drinking. Further weaning of caffeine can be undertaken later. He was also instructed to avoid skipping or missing doses of metoprolol and diltiazem particularly. His blood pressures were normal during the hospitalization, although his blood pressure was elevated in the ED. He reports feeling dizzy at home sometimes when he stands up which could be secondary to relative hypotension from taking 4 antihypertensive medications. He was instructed to hold or not take ramipril and hydrochlorothiazide if he felt that his blood pressure was too low. Both he and his wife voiced understanding. He was also advised to check his blood sugars at night if he felt palpitations again to assess for hypoglycemia.   Procedures:  None  Consultations:  None  Discharge Exam: Filed Vitals:   01/10/13 0300 01/10/13 0400 01/10/13 0532 01/10/13 1405  BP: 134/75 150/84 148/80 114/74  Pulse: 88 82 80 89  Temp:   97.5 F (36.4 C) 97.9 F (36.6 C)  TempSrc:   Oral   Resp: 20 19 20 20   Height:      Weight:      SpO2: 95% 93% 99% 96%    General: Pleasant 57 year old Caucasian man sitting up in bed, in no acute distress. Cardiovascular: S1, S2, with a soft systolic murmur. Respiratory: Clear to auscultation bilaterally.  Discharge  Instructions      Discharge Orders   Future Orders Complete By Expires     Diet - low sodium heart healthy  As directed     Diet - low sodium heart healthy  As directed     Diet Carb Modified  As directed     Diet Carb Modified  As directed     Discharge instructions  As directed     Comments:      Avoid missing doses of diltiazem and metoprolol XL. If you feel dizzy when you stand or if your systolic blood pressure (top number) is less than 120, do not take ramipril or hydrochlorothiazide but continue your other blood pressure medications until you see your primary care doctor or your cardiologist. If you do not feel dizzy when you stand and if your systolic blood pressure is greater than 120, continue all 4 of your blood pressure medications. Decrease the intake of your coffee by decreasing 8 ounces every other day until you're drinking approximately half of what you are currently drinking. Followup with your cardiologist in one to 2 weeks.    Discharge instructions  As directed     Comments:      Check your blood sugar when palpitations occur again to make sure that it is not too low.    Increase activity slowly  As directed     Increase activity slowly  As directed  Medication List    TAKE these medications       aspirin EC 81 MG tablet  Take 1 tablet (81 mg total) by mouth daily.     CoQ-10 100 MG Caps  Take 1 capsule by mouth daily.     diltiazem 240 MG 24 hr capsule  Commonly known as:  CARDIZEM CD  Take 240 mg by mouth daily.     ezetimibe-simvastatin 10-80 MG per tablet  Commonly known as:  VYTORIN  Take 1 tablet by mouth at bedtime.     fish oil-omega-3 fatty acids 1000 MG capsule  Take 1 g by mouth daily.     hydrochlorothiazide 12.5 MG capsule  Commonly known as:  MICROZIDE  Take 12.5 mg by mouth daily.     insulin lispro 100 UNIT/ML injection  Commonly known as:  HUMALOG  Inject 0-15 Units into the skin 3 (three) times daily before meals. Sliding  Scale     LANTUS SOLOSTAR 100 UNIT/ML injection  Generic drug:  insulin glargine  Inject 50 Units into the skin at bedtime.     metoprolol succinate 50 MG 24 hr tablet  Commonly known as:  TOPROL-XL  Take 75 mg by mouth 2 (two) times daily.     niacin 500 MG CR tablet  Commonly known as:  NIASPAN  Take 500 mg by mouth at bedtime.     pantoprazole 40 MG tablet  Commonly known as:  PROTONIX  Take 40 mg by mouth daily.     ramipril 10 MG capsule  Commonly known as:  ALTACE  Take 10 mg by mouth daily.       Follow-up Information   Follow up with Rebecca Eaton, MD. Schedule an appointment as soon as possible for a visit in 1 week.   Contact information:   [address] [place] [address] [phone]       Follow up with Leonides Grills, MD. Schedule an appointment as soon as possible for a visit in 2 weeks.   Contact information:   [id number] [place] [address] [phone]        The results of significant diagnostics from this hospitalization (including imaging, microbiology, ancillary and laboratory) are listed below for reference.    Significant Diagnostic Studies: Dg Chest 2 View  01/10/2013  *RADIOLOGY REPORT*  Clinical Data: Palpitations.  Tachycardia.  CHEST - 2 VIEW  Comparison: 04/08/2007  Findings: Stable appearance of postoperative changes in the mediastinum. The heart size and pulmonary vascularity are normal. The lungs appear clear and expanded without focal air space disease or consolidation. No blunting of the costophrenic angles.  No pneumothorax.  Mediastinal contours appear intact.  No significant change since previous study.  IMPRESSION: No evidence of active pulmonary disease.   Original Report Authenticated By: Lucienne Capers, M.D.     Microbiology: No results found for this or any previous visit (from the past 240 hour(s)).   Labs: Basic Metabolic Panel:  Recent Labs Lab  01/10/13 0045 01/10/13 0544  NA 141  --   K 3.7  --   CL 100  --   CO2 28  --   GLUCOSE 189*  --   BUN 17  --   CREATININE 0.95  --   CALCIUM 10.0  --   MG  --  2.1   Liver Function Tests:  Recent Labs Lab 01/10/13 0544  AST 17  ALT 22  ALKPHOS 88  BILITOT 0.4  PROT 7.9  ALBUMIN 4.6   No results found for this basename: LIPASE, AMYLASE,  in the last 168 hours No results found for this basename: AMMONIA,  in the last 168 hours CBC:  Recent Labs Lab 01/10/13 0045  WBC 12.2*  NEUTROABS 7.7  HGB 13.8  HCT 39.6  MCV 85.7  PLT 282   Cardiac Enzymes:  Recent Labs Lab 01/10/13 0045 01/10/13 0811 01/10/13 1358  TROPONINI <0.30 <0.30 <0.30   BNP: BNP (last 3 results) No results found for this basename: PROBNP,  in the last 8760 hours CBG:  Recent Labs Lab 01/10/13 0749  GLUCAP 138*       Signed:  Marsella Suman  [facility] 01/10/2013, 4:28 PM

## 2013-01-10 NOTE — H&P (Signed)
[facility] History and Physical  KEL AMADON  [id number]  DOB: 1956-09-18   DOA: 01/10/2013   PCP:   Leonides Grills, MD  Endocrinologist: Dr. Dorris Fetch Cardiologist: Dr. Heide Guile. [place]  Chief Complaint:  Chest tightness and palpitations this night  HPI: Ian [name] is an 57 y.o. male.  Obese Caucasian gentleman with a history ofCAd s/p CABG 2008,  hypertension hyperlipidemia, was started on insulin 4 months ago for diabetes type 2, comes in with his third episode of frightening nocturnal palpitations, lasting about half an hour, followed by chest tightness, left arm numbness. Associated diaphoresis and shortness of breath, and nausea. Each time it has gotten progressively worse and tonight it was so scary that he came to the emergency room for assistance.  He does not exercise; has not seen his cardiologist for 2 years. He has a known cardiac murmur, does not know the cause.  He reports is fasting blood sugars tend to be in the 150s, he reports frequent nocturnal, snoring, and daytime somnolence. His height is 5 foot 4 inches, his weight is 205 pounds, he does not know his neck size.    Rewiew of Systems:   All systems negative except as marked bold or noted in the HPI;  Constitutional:    malaise, fever and chills. ;  Eyes:   eye pain, redness and discharge. ;  ENMT:   ear pain, hoarseness, nasal congestion, sinus pressure and sore throat. ;  Cardiovascular:    chest pain, palpitations, diaphoresis, dyspnea and peripheral edema.  Respiratory:   cough, hemoptysis, wheezing and stridor. ;  Gastrointestinal:  nausea, vomiting, diarrhea, constipation, abdominal pain, melena, blood in stool, hematemesis, jaundice and rectal bleeding. unusual weight loss..   Genitourinary:    frequency, dysuria, incontinence,flank pain and hematuria; Musculoskeletal:   back pain and neck pain.  swelling and trauma.;  Skin: .  pruritus, rash, abrasions, bruising and skin lesion.;  ulcerations Neuro:    headache, lightheadedness and neck stiffness.  weakness, altered level of consciousness, altered mental status, extremity weakness, burning feet, involuntary movement, seizure and syncope.  Psych:    anxiety, depression, insomnia, tearfulness, panic attacks, hallucinations, paranoia, suicidal or homicidal ideation    Past Medical History  Diagnosis Date  . History of heart bypass surgery   . Diabetes mellitus without complication   . Hypertension     Past Surgical History  Procedure Laterality Date  . Heart bypass      Medications:  HOME MEDS: Prior to Admission medications   Medication Sig Start Date End Date Taking? Authorizing Provider  ezetimibe-simvastatin (VYTORIN) 10-80 MG per tablet Take 1 tablet by mouth at bedtime.   Yes Historical Provider, MD  METOPROLOL SUCCINATE ER PO Take 50 mg by mouth.   Yes Historical Provider, MD  Niacin, Antihyperlipidemic, (NIASPAN PO) Take by mouth.   Yes Historical Provider, MD  pantoprazole (PROTONIX) 40 MG tablet Take 40 mg by mouth daily.   Yes Historical Provider, MD  ramipril (ALTACE) 10 MG capsule Take 10 mg by mouth daily.   Yes Historical Provider, MD     Allergies:  No Known Allergies  Social History:   reports that he has quit smoking. He does not have any smokeless tobacco history on file. He reports that he does not drink alcohol or use illicit drugs.  Family History: Family History  Problem Relation Age of Onset  . Hypertension Father   . Hypertension Sister   . Hypertension Brother      Physical  Exam: Filed Vitals:   01/10/13 0200 01/10/13 0300 01/10/13 0400 01/10/13 0532  BP: 150/70 134/75 150/84 148/80  Pulse: 89 88 82 80  Temp:    97.5 F (36.4 C)  TempSrc:    Oral  Resp: 17 20 19 20   Height:      Weight:      SpO2: 96% 95% 93% 99%   Blood pressure 148/80, pulse 80, temperature 97.5 F (36.4 C), temperature source Oral, resp. rate 20, height 5\' 4"  (1.626 m), weight 92.987 kg (205  lb), SpO2 99.00%.  GEN:  Pleasant obese Caucasian gentleman lying in the stretcher in no acute distress; cooperative with exam PSYCH:  alert and oriented x4; does not appear anxious or depressed; affect is appropriate. HEENT: Mucous membranes pink and anicteric; PERRLA; EOM intact; no cervical lymphadenopathy nor thyromegaly or carotid bruit; no JVD; thick neck Breasts:: Not examined CHEST WALL: No tenderness CHEST: Normal respiration, clear to auscultation bilaterally HEART: Regular rate and rhythm; 2/6 systolic murmur BACK: No kyphosis or scoliosis; no CVA tenderness ABDOMEN: Obese, soft non-tender; no masses, no organomegaly, normal abdominal bowel sounds;no intertriginous candida. Rectal Exam: Not done EXTREMITIES: No bone or joint deformity; no edema; no ulcerations. Genitalia: not examined PULSES: 2+ and symmetric SKIN: Normal hydration no rash or ulceration CNS: Cranial nerves 2-12 grossly intact no focal lateralizing neurologic deficit   Labs on Admission:  Basic Metabolic Panel:  Recent Labs Lab 01/10/13 0045  NA 141  K 3.7  CL 100  CO2 28  GLUCOSE 189*  BUN 17  CREATININE 0.95  CALCIUM 10.0   Liver Function Tests: No results found for this basename: AST, ALT, ALKPHOS, BILITOT, PROT, ALBUMIN,  in the last 168 hours No results found for this basename: LIPASE, AMYLASE,  in the last 168 hours No results found for this basename: AMMONIA,  in the last 168 hours CBC:  Recent Labs Lab 01/10/13 0045  WBC 12.2*  NEUTROABS 7.7  HGB 13.8  HCT 39.6  MCV 85.7  PLT 282   Cardiac Enzymes:  Recent Labs Lab 01/10/13 0045  TROPONINI <0.30   BNP: No components found with this basename: POCBNP,  D-dimer: No components found with this basename: D-DIMER,  CBG: No results found for this basename: GLUCAP,  in the last 168 hours  Radiological Exams on Admission: Dg Chest 2 View  01/10/2013  *RADIOLOGY REPORT*  Clinical Data: Palpitations.  Tachycardia.  CHEST - 2 VIEW   Comparison: 04/08/2007  Findings: Stable appearance of postoperative changes in the mediastinum. The heart size and pulmonary vascularity are normal. The lungs appear clear and expanded without focal air space disease or consolidation. No blunting of the costophrenic angles.  No pneumothorax.  Mediastinal contours appear intact.  No significant change since previous study.  IMPRESSION: No evidence of active pulmonary disease.   Original Report Authenticated By: Lucienne Capers, M.D.     EKG: Independently reviewed. Normal sinus rhythm; poor R wave progression anterior leads- possibly due to lead placement; no ST segment abnormalities   Assessment/Plan Present on Admission:  . Palpitations, recurrent  Probably due to recurrent arrhythmic event, from hypoxia, cardiac abnormality, or hypoglycemia  . OSA (obstructive sleep apnea), probable;   possible cause of nocturnal hypoxia, and recurrent palpitations  . Obesity . HTN (hypertension) . CAD in native artery, status post CABG  . Other and unspecified hyperlipidemia  . Diabetes   PLAN: We'll bring this gentleman on observation to rule out unstable angina/ acute coronary syndrome; and monitor while in  hospital. [facility] place him on continuous pulse ox, which may be useful if he stays overnight. We'll increase the dose of his beta blocker, to Lopressor 50 mg twice daily, to see if he can tolerate this while in hospital He may need outpatient cardiac monitor. He'll benefit from outpatient sleep study.  Advised on the importance of weight loss; for his overall health and his obstructive sleep apnea  Other plans as per orders.  Code Status: FC  Family Communication: Wife and son at bedside for interview examination and discussion of plans Disposition Plan: Probably discharge to home later today or tomorrow remain stable, with further out patient cardiac    Claron Rosencrans Nocturnist [facility] Pager [phone]   01/10/2013, 5:59  AM

## 2013-01-10 NOTE — Progress Notes (Signed)
Discharge instructions given. Saline lock removed. Pt verbalized understanding of instructions. Pt left floor ambulatory with nursing staff and family member.

## 2013-01-10 NOTE — ED Notes (Signed)
Pt states that earlier tonight he had palpitations and felt that his heart was beating very fast. States that he felt lightheaded and weak as well. Pt has a cardiac history. On arrival and currently pt is in NSR in 80s-90s. Denies chest pain at current and states he had no chest pain when he had the palpitations. No other complaints at this time. Attempted to establish IV but unsucessful. Matilde Haymaker RN in room to attempt.

## 2013-01-10 NOTE — ED Notes (Signed)
Dr Megan Salon assessing pt at this time in ED, will send patient up when he is through

## 2013-01-10 NOTE — ED Notes (Signed)
Pt informed of delay, original ED admission orders did not go through correctly, Dr bonk notified and has re done them. Bed request has now been put in.

## 2013-01-10 NOTE — ED Provider Notes (Signed)
History     CSN: [id number]  Arrival date & time 01/09/13  2330   First MD Initiated Contact with Patient 01/10/13 0000      Chief Complaint  Patient presents with  . Tachycardia    (Consider location/radiation/quality/duration/timing/severity/associated sxs/prior treatment) HPI Ian [name] is a 57 y.o. male with a history of hypertension, diabetes, hyperlipidemia and remote smoking 10 pack year history and CABG x5 (6 years ago) who presents with a 30-60 minute history of rapid heartbeat this started while in bed.  Patient was awoken from sleep with rapid heartbeat, he said he felt like his heart was beating out of his chest it is associated with diaphoresis, nausea, left arm pain which felt like a tingly/fuzzy feeling, he said this was a 5/10. Denies any shortness of breath or chest pain. He says this happened about one year ago and he saw his cardiologist however he is not sure if he mentioned this rapid heartbeat at that point. Patient has a pertinent family history for father, brother, sister all dying of myocardial infarction at 34, 8 and 56 years of age respectively. Patient has not had a catheterization or stenting of his bypass grafts. Patient says he is currently symptom-free at this time.   Past Medical History  Diagnosis Date  . History of heart bypass surgery   . Diabetes mellitus without complication   . Hypertension     Past Surgical History  Procedure Laterality Date  . Heart bypass      Family History  Problem Relation Age of Onset  . Hypertension Father   . Hypertension Sister   . Hypertension Brother     History  Substance Use Topics  . Smoking status: Former Research scientist (life sciences)  . Smokeless tobacco: Not on file  . Alcohol Use: No      Review of Systems At least 10pt or greater review of systems completed and are negative except where specified in the HPI.  Allergies  Review of patient's allergies indicates no known allergies.  Home Medications    Current Outpatient Rx  Name  Route  Sig  Dispense  Refill  . ezetimibe-simvastatin (VYTORIN) 10-80 MG per tablet   Oral   Take 1 tablet by mouth at bedtime.         Marland Kitchen METOPROLOL SUCCINATE ER PO   Oral   Take 50 mg by mouth.         . Niacin, Antihyperlipidemic, (NIASPAN PO)   Oral   Take by mouth.         . pantoprazole (PROTONIX) 40 MG tablet   Oral   Take 40 mg by mouth daily.         . ramipril (ALTACE) 10 MG capsule   Oral   Take 10 mg by mouth daily.           BP 167/103  Pulse 99  Temp(Src) 97.9 F (36.6 C) (Oral)  Resp 20  Ht 5\' 4"  (1.626 m)  Wt 205 lb (92.987 kg)  BMI 35.17 kg/m2  SpO2 97%  Physical Exam  Nursing notes reviewed.  Electronic medical record reviewed. VITAL SIGNS:   Filed Vitals:   01/10/13 0200 01/10/13 0300 01/10/13 0400 01/10/13 0532  BP: 150/70 134/75 150/84 148/80  Pulse: 89 88 82 80  Temp:    97.5 F (36.4 C)  TempSrc:    Oral  Resp: 17 20 19 20   Height:      Weight:      SpO2: 96% 95% 93%  99%   CONSTITUTIONAL: Awake, oriented, appears non-toxic HENT: Atraumatic, normocephalic, oral mucosa pink and moist, airway patent. Nares patent without drainage. External ears normal. EYES: Conjunctiva clear, EOMI, PERRLA NECK: Trachea midline, non-tender, supple CARDIOVASCULAR: Normal heart rate, Normal rhythm, well healed median sternotomy scar PULMONARY/CHEST: Clear to auscultation, no rhonchi, wheezes, or rales. Symmetrical breath sounds. Non-tender. ABDOMINAL: Non-distended, soft, non-tender - no rebound or guarding.  BS normal. NEUROLOGIC: Non-focal, moving all four extremities, no gross sensory or motor deficits. EXTREMITIES: No clubbing, cyanosis, or edema SKIN: Warm, Dry, No erythema, No rash  ED Course  Procedures (including critical care time)  Date: 01/10/2013  Rate: 91  Rhythm: normal sinus rhythm  QRS Axis: normal  Intervals: normal  ST/T Wave abnormalities: normal  Conduction Disutrbances: none   Narrative Interpretation: unremarkable no prior EKG to compare     Labs Reviewed  CBC WITH DIFFERENTIAL - Abnormal; Notable for the following:    WBC 12.2 (*)    Monocytes Absolute 1.2 (*)    All other components within normal limits  BASIC METABOLIC PANEL - Abnormal; Notable for the following:    Glucose, Bld 189 (*)    All other components within normal limits  GLUCOSE, CAPILLARY - Abnormal; Notable for the following:    Glucose-Capillary 138 (*)    All other components within normal limits  TROPONIN I  HEPATIC FUNCTION PANEL  MAGNESIUM  TSH  HEMOGLOBIN A1C  TROPONIN I  TROPONIN I  TROPONIN I   Dg Chest 2 View  01/10/2013  *RADIOLOGY REPORT*  Clinical Data: Palpitations.  Tachycardia.  CHEST - 2 VIEW  Comparison: 04/08/2007  Findings: Stable appearance of postoperative changes in the mediastinum. The heart size and pulmonary vascularity are normal. The lungs appear clear and expanded without focal air space disease or consolidation. No blunting of the costophrenic angles.  No pneumothorax.  Mediastinal contours appear intact.  No significant change since previous study.  IMPRESSION: No evidence of active pulmonary disease.   Original Report Authenticated By: Lucienne Capers, M.D.      1. Rapid palpitations   2. Diaphoresis   3. Nausea   4. CAD (coronary artery disease)   5. S/P coronary artery bypass graft x 3   6. Hypertension   7. Hyperlipemia   8. Diabetes mellitus, type 2       MDM  Ian [name] is a 57 y.o. male very pertinent family history of coronary artery disease and death by MI who started had bypass x5 at age 53 presenting with rapid heartbeat x30 minutes to an hour which is associated with diaphoresis, nausea and some left arm numbness.  Patient is symptom-free at this time, however I do not think this patient is best served by going home, patient will require observation on telemetry and we'll cycle his enzymes. Patient's initial troponin is  unremarkable, EKG is a normal sinus rhythm. At this point it seems he's had no cardiac damage. Patient has had aspirin 324.  Patient's currently having no other symptoms, have discussed his case with Dr. Megan Salon and will be admitted by triad hospitalist to telemetry.  She is stable for admission         Rhunette Croft, MD 01/10/13 [phone]

## 2013-01-12 LAB — GLUCOSE, CAPILLARY

## 2013-01-14 NOTE — Progress Notes (Signed)
UR Chart Review Completed  

## 2013-09-15 ENCOUNTER — Ambulatory Visit: Payer: Medicare HMO | Admitting: Gastroenterology

## 2015-01-26 NOTE — H&P (Signed)
  NTS SOAP Note  Vital Signs:  Vitals as of: 0000000: Systolic [id number]: Diastolic 87: Heart Rate 93: Temp 96.72F: Height 7ft 4in: Weight 206Lbs 0 Ounces: BMI 35.36  BMI : 35.36 kg/m2  Subjective: This 59 year old male presents for of an umbilical hernia.  Has been present for some time,  but is increasing in size and causing him discomfort.  Review of Symptoms:  Constitutional:unremarkable   Head:unremarkable Eyes:unremarkable   Nose/Mouth/Throat:unremarkable Cardiovascular:  unremarkable Respiratory:unremarkable Gastrointestinal:  unremarkable   Genitourinary:unremarkable   Musculoskeletal:unremarkable Skin:unremarkable Hematolgic/Lymphatic:unremarkable   Allergic/Immunologic:unremarkable   Past Medical History:  Reviewed  Past Medical History  Surgical History: CABG Medical Problems: CAD,  HTn NIDDM,  reflux  Allergies: nkda Medications: metoprolol,  tadjenta,  HCTZ,  niacin,  ramipril,  diltiazem,  metformink,  vytorin,  invokana,  pantoprozole,  asa   Social History:Reviewed  Social History  Preferred Language: English Race:  White Ethnicity: Not Hispanic / Latino Age: 36 year Marital Status:  M Alcohol: no   Smoking Status: Never smoker reviewed on 01/25/2015 Functional Status reviewed on 01/25/2015 ------------------------------------------------ Bathing: Normal Cooking: Normal Dressing: Normal Driving: Normal Eating: Normal Managing Meds: Normal Oral Care: Normal Shopping: Normal Toileting: Normal Transferring: Normal Walking: Normal Cognitive Status reviewed on 01/25/2015 ------------------------------------------------ Attention: Normal Decision Making: Normal Language: Normal Memory: Normal Motor: Normal Perception: Normal Problem Solving: Normal Visual and Spatial: Normal   Family History:Reviewed  Family Health History Family History is Unknown    Objective Information: General:Well appearing, well  nourished in no distress. Heart:RRR, no murmur Lungs:  CTA bilaterally, no wheezes, rhonchi, rales.  Breathing unlabored. Abdomen:Soft, NT/ND, no HSM, no masses.  Reducible umbilical hernia with thin skin present over it.  Assessment:Umbilical hernia  Diagnoses: [id number]  [id number] Umbilical hernia (Umbilical hernia without obstruction or gangrene)  Procedures: [id number] - OFFICE OUTPATIENT NEW 30 MINUTES    Plan:  Scheduled for umbilical herniorrhaphy with mesh on 02/07/15.   Patient Education:Alternative treatments to surgery were discussed with patient (and family).  Risks and benefits  of procedure including bleeding, infection,  mesh use,  and recurrence of the hernia were fully explained to the patient (and family) who gave informed consent. Patient/family questions were addressed.  Follow-up:Pending Surgery

## 2015-02-01 NOTE — Patient Instructions (Addendum)
Ian [name]  02/01/2015   Your procedure is scheduled on:  02/07/2015  Report to [facility] at 6:15 AM.  Call this number if you have problems the morning of surgery: [phone]   Remember:   Do not eat food or drink liquids after midnight.   Take these medicines the morning of surgery with A SIP OF WATER: Cardizem, Metoprolol, Altace, Protonix   Do not take diabetic medication morning of procedure and only 1/2 dose of evening insulin the pm, prior to surgery.   Do not wear jewelry, make-up or nail polish.  Do not wear lotions, powders, or perfumes. You may wear deodorant.  Do not shave 48 hours prior to surgery. Men may shave face and neck.  Do not bring valuables to the hospital.  [facility] is not responsible for any belongings or valuables.               Contacts, dentures or bridgework may not be worn into surgery.  Leave suitcase in the car. After surgery it may be brought to your room.  For patients admitted to the hospital, discharge time is determined by your treatment team.               Patients discharged the day of surgery will not be allowed to drive home.  Name and phone number of your driver:  Special Instructions: Shower using CHG 2 nights before surgery and the night before surgery.  If you shower the day of surgery use CHG.  Use special wash - you have one bottle of CHG for all showers.  You should use approximately 1/3 of the bottle for each shower. N/A   Please read over the following fact sheets that you were given: Surgical Site Infection Prevention and Anesthesia Post-op Instructions   ATIENT INSTRUCTIONS POST-ANESTHESIA  IMMEDIATELY FOLLOWING SURGERY:  Do not drive or operate machinery for the first twenty four hours after surgery.  Do not make any important decisions for twenty four hours after surgery or while taking narcotic pain medications or sedatives.  If you develop intractable nausea and vomiting or a severe headache please notify your doctor  immediately.  FOLLOW-UP:  Please make an appointment with your surgeon as instructed. You do not need to follow up with anesthesia unless specifically instructed to do so.  WOUND CARE INSTRUCTIONS (if applicable):  Keep a dry clean dressing on the anesthesia/puncture wound site if there is drainage.  Once the wound has quit draining you may leave it open to air.  Generally you should leave the bandage intact for twenty four hours unless there is drainage.  If the epidural site drains for more than 36-48 hours please call the anesthesia department.  QUESTIONS?:  Please feel free to call your physician or the hospital operator if you have any questions, and they will be happy to assist you.      Umbilical Herniorrhaphy Herniorrhaphy is surgery to repair a hernia. A hernia is the protrusion of a part of an organ through an abdominal opening. An umbilical hernia means that your hernia is in the area around your navel. If the hernia is not repaired, the gap could get bigger. Your intestines or other tissues, such as fat, could get trapped in the gap. This can lead to other health problems, such as blocked intestines. If the hernia is fixed before problems set in, you may be allowed to go home the same day as the surgery (outpatient). LET [facility] CARE PROVIDER KNOW ABOUT:  Allergies to food or medicine.  Medicines taken, including vitamins, herbs, eye drops, over-the-counter medicines, and creams.  Use of steroids (by mouth or creams).  Previous problems with anesthetics or numbing medicines.  History of bleeding problems or blood clots.  Previous surgery.  Other health problems, including diabetes and kidney problems.  Possibility of pregnancy, if this applies. RISKS AND COMPLICATIONS  Pain.  Excessive bleeding.  Hematoma. This is a pocket of blood that collects under the surgery site.  Infection at the surgery site.  Numbness at the surgery site.  Swelling and  bruising.  Blood clots.  Intestinal damage (rare).  Scarring.  Skin damage.  Development of another hernia. This may require another surgery. BEFORE THE PROCEDURE  Ask your health care provider about changing or stopping your regular medicines. You may need to stop taking aspirin, nonsteroidal anti-inflammatory drugs (NSAIDs), vitamin E, and blood thinners as early as 2 weeks before the procedure.  Do not eat or drink for 8 hours before the procedure, or as directed by your health care provider.  You might be asked to shower or wash with an antibacterial soap before the procedure.  Wear comfortable clothes that will be easy to put on after the procedure. PROCEDURE You will be given an intravenous (IV) tube. A needle will be inserted in your arm. Medicine will flow directly into your body through this needle. You might be given medicine to help you relax (sedative). You will be given medicine that numbs the area (local anesthetic) or medicine that makes you sleep (general anesthetic). If you have open surgery:  The surgeon will make a cut (incision) in your abdomen.  The gap in the muscle wall will be repaired. The surgeon may sew the edges together over the gap or use a mesh material to strengthen the area. When mesh is used, the body grows new, strong tissue into and around it. This new tissue closes the gap.  A drain might be put in to remove excess fluid from the body after surgery.  The surgeon will close the incision with stitches, glue, or staples. If you have laparoscopic surgery:  The surgeon will make several small incisions in your abdomen.  A thin, lighted tube (laparoscope) will be inserted into the abdomen through an incision. A camera is attached to the laparoscope that allows the surgeon to see inside the abdomen.  Tools will be inserted through the other incisions to repair the hernia. Usually, mesh is used to cover the gap.  The surgeon will close the incisions  with stitches. AFTER THE PROCEDURE  You will be taken to a recovery area. A nurse will watch and check your progress.  When you are awake, feeling well, and taking fluids well, you may be allowed to go home. In some cases, you may need to stay overnight in the hospital.  Arrange for someone to drive you home. Document Released: 02/01/2009 Document Revised: 03/22/2014 Document Reviewed: 02/06/2012 [facility] Patient Information 2015 [place], [place]. This information is not intended to replace advice given to you by your health care provider. Make sure you discuss any questions you have with your health care provider.

## 2015-02-02 ENCOUNTER — Encounter (HOSPITAL_COMMUNITY): Payer: Self-pay

## 2015-02-02 ENCOUNTER — Other Ambulatory Visit: Payer: Self-pay

## 2015-02-02 ENCOUNTER — Encounter (HOSPITAL_COMMUNITY)
Admission: RE | Admit: 2015-02-02 | Discharge: 2015-02-02 | Disposition: A | Discharge status: Home / Self Care | Payer: Managed Care, Other (non HMO) | Source: Ambulatory Visit | Attending: General Surgery | Admitting: General Surgery

## 2015-02-02 DIAGNOSIS — Z01812 Encounter for preprocedural laboratory examination: Secondary | ICD-10-CM | POA: Diagnosis present

## 2015-02-02 DIAGNOSIS — K429 Umbilical hernia without obstruction or gangrene: Secondary | ICD-10-CM | POA: Insufficient documentation

## 2015-02-02 DIAGNOSIS — Z01810 Encounter for preprocedural cardiovascular examination: Secondary | ICD-10-CM | POA: Insufficient documentation

## 2015-02-02 HISTORY — DX: Gastro-esophageal reflux disease without esophagitis: K21.9

## 2015-02-02 HISTORY — DX: Adverse effect of unspecified anesthetic, initial encounter: T41.45XA

## 2015-02-02 HISTORY — DX: Family history of other specified conditions: Z84.89

## 2015-02-02 HISTORY — DX: Other complications of anesthesia, initial encounter: T88.59XA

## 2015-02-02 LAB — CBC WITH DIFFERENTIAL/PLATELET
BASOS ABS: 0 10*3/uL (ref 0.0–0.1)
BASOS PCT: 0 % (ref 0–1)
Eosinophils Absolute: 0.1 10*3/uL (ref 0.0–0.7)
Eosinophils Relative: 1 % (ref 0–5)
HCT: 43.8 % (ref 39.0–52.0)
Hemoglobin: 15 g/dL (ref 13.0–17.0)
Lymphocytes Relative: 16 % (ref 12–46)
Lymphs Abs: 2.3 10*3/uL (ref 0.7–4.0)
MCH: 30.3 pg (ref 26.0–34.0)
MCHC: 34.2 g/dL (ref 30.0–36.0)
MCV: 88.5 fL (ref 78.0–100.0)
MONOS PCT: 5 % (ref 3–12)
Monocytes Absolute: 0.8 10*3/uL (ref 0.1–1.0)
NEUTROS PCT: 78 % — AB (ref 43–77)
Neutro Abs: 11.3 10*3/uL — ABNORMAL HIGH (ref 1.7–7.7)
PLATELETS: 284 10*3/uL (ref 150–400)
RBC: 4.95 MIL/uL (ref 4.22–5.81)
RDW: 13 % (ref 11.5–15.5)
WBC: 14.6 10*3/uL — ABNORMAL HIGH (ref 4.0–10.5)

## 2015-02-02 LAB — BASIC METABOLIC PANEL
Anion gap: 10 (ref 5–15)
BUN: 25 mg/dL — ABNORMAL HIGH (ref 6–23)
CALCIUM: 10 mg/dL (ref 8.4–10.5)
CO2: 25 mmol/L (ref 19–32)
Chloride: 105 mmol/L (ref 96–112)
Creatinine, Ser: 0.98 mg/dL (ref 0.50–1.35)
GFR calc Af Amer: >90 mL/min (ref >90)
GFR calc non Af Amer: 90 mL/min — ABNORMAL LOW (ref >90)
Glucose, Bld: 219 mg/dL — ABNORMAL HIGH (ref 70–99)
Potassium: 4.5 mmol/L (ref 3.5–5.1)
SODIUM: 140 mmol/L (ref 135–145)

## 2015-02-02 NOTE — Pre-Procedure Instructions (Signed)
Patient given information to sign up for my chart at home. 

## 2015-02-07 ENCOUNTER — Ambulatory Visit (HOSPITAL_COMMUNITY): Payer: Managed Care, Other (non HMO) | Admitting: Anesthesiology

## 2015-02-07 ENCOUNTER — Encounter (HOSPITAL_COMMUNITY): Payer: Self-pay

## 2015-02-07 ENCOUNTER — Inpatient Hospital Stay (HOSPITAL_COMMUNITY)
Admission: RE | Admit: 2015-02-07 | Discharge: 2015-02-07 | DRG: 355 | Disposition: A | Discharge status: Home / Self Care | Payer: Managed Care, Other (non HMO) | Source: Ambulatory Visit | Attending: General Surgery | Admitting: General Surgery

## 2015-02-07 ENCOUNTER — Encounter (HOSPITAL_COMMUNITY)
Admission: RE | Disposition: A | Discharge status: Home / Self Care | Payer: Self-pay | Source: Ambulatory Visit | Attending: General Surgery

## 2015-02-07 ENCOUNTER — Encounter (HOSPITAL_COMMUNITY): Payer: Self-pay | Admitting: *Deleted

## 2015-02-07 DIAGNOSIS — E8809 Other disorders of plasma-protein metabolism, not elsewhere classified: Secondary | ICD-10-CM | POA: Diagnosis not present

## 2015-02-07 DIAGNOSIS — G8389 Other specified paralytic syndromes: Secondary | ICD-10-CM | POA: Diagnosis not present

## 2015-02-07 DIAGNOSIS — T481X5A Adverse effect of skeletal muscle relaxants [neuromuscular blocking agents], initial encounter: Secondary | ICD-10-CM | POA: Diagnosis not present

## 2015-02-07 DIAGNOSIS — K219 Gastro-esophageal reflux disease without esophagitis: Secondary | ICD-10-CM | POA: Diagnosis present

## 2015-02-07 DIAGNOSIS — K429 Umbilical hernia without obstruction or gangrene: Secondary | ICD-10-CM | POA: Diagnosis present

## 2015-02-07 DIAGNOSIS — E119 Type 2 diabetes mellitus without complications: Secondary | ICD-10-CM | POA: Diagnosis present

## 2015-02-07 DIAGNOSIS — Z951 Presence of aortocoronary bypass graft: Secondary | ICD-10-CM

## 2015-02-07 DIAGNOSIS — I10 Essential (primary) hypertension: Secondary | ICD-10-CM | POA: Diagnosis present

## 2015-02-07 DIAGNOSIS — Z9911 Dependence on respirator [ventilator] status: Secondary | ICD-10-CM

## 2015-02-07 HISTORY — PX: INSERTION OF MESH: SHX5868

## 2015-02-07 HISTORY — PX: UMBILICAL HERNIA REPAIR: SHX196

## 2015-02-07 LAB — BLOOD GAS, ARTERIAL
Acid-base deficit: 0.9 mmol/L (ref 0.0–2.0)
Bicarbonate: 23.1 mEq/L (ref 20.0–24.0)
DRAWN BY: 234301
FIO2: 40 %
LHR: 15 {breaths}/min
MECHVT: 480 mL
O2 Saturation: 93.2 %
PCO2 ART: 37.1 mmHg (ref 35.0–45.0)
PEEP: 5 cmH2O
PH ART: 7.411 (ref 7.350–7.450)
Patient temperature: 37
TCO2: 20.4 mmol/L (ref 0–100)
pO2, Arterial: 70.3 mmHg — ABNORMAL LOW (ref 80.0–100.0)

## 2015-02-07 LAB — MRSA PCR SCREENING: MRSA by PCR: NEGATIVE

## 2015-02-07 LAB — GLUCOSE, CAPILLARY: Glucose-Capillary: 239 mg/dL — ABNORMAL HIGH (ref 70–99)

## 2015-02-07 SURGERY — REPAIR, HERNIA, UMBILICAL, ADULT
Anesthesia: General

## 2015-02-07 MED ORDER — FENTANYL CITRATE 0.05 MG/ML IJ SOLN
50.0000 ug | Freq: Once | INTRAMUSCULAR | Status: AC
  Administered 2015-02-07: 50 ug via INTRAVENOUS
  Filled 2015-02-07: qty 2

## 2015-02-07 MED ORDER — MIDAZOLAM HCL 2 MG/2ML IJ SOLN
INTRAMUSCULAR | Status: AC
  Filled 2015-02-07: qty 2

## 2015-02-07 MED ORDER — KETOROLAC TROMETHAMINE 30 MG/ML IJ SOLN
INTRAMUSCULAR | Status: AC
  Filled 2015-02-07: qty 1

## 2015-02-07 MED ORDER — LACTATED RINGERS IV SOLN
INTRAVENOUS | Status: DC
  Administered 2015-02-07: 1000 mL via INTRAVENOUS

## 2015-02-07 MED ORDER — ROCURONIUM BROMIDE 50 MG/5ML IV SOLN
INTRAVENOUS | Status: AC
  Filled 2015-02-07: qty 1

## 2015-02-07 MED ORDER — POVIDONE-IODINE 10 % EX OINT
TOPICAL_OINTMENT | CUTANEOUS | Status: AC
  Filled 2015-02-07: qty 1

## 2015-02-07 MED ORDER — FENTANYL CITRATE 0.05 MG/ML IJ SOLN
INTRAMUSCULAR | Status: DC | PRN
  Administered 2015-02-07 (×3): 50 ug via INTRAVENOUS
  Administered 2015-02-07: 100 ug via INTRAVENOUS

## 2015-02-07 MED ORDER — LABETALOL HCL 5 MG/ML IV SOLN
INTRAVENOUS | Status: DC | PRN
  Administered 2015-02-07: 5 mg via INTRAVENOUS
  Administered 2015-02-07: 10 mg via INTRAVENOUS
  Administered 2015-02-07: 5 mg via INTRAVENOUS

## 2015-02-07 MED ORDER — NALOXONE HCL 0.4 MG/ML IJ SOLN
INTRAMUSCULAR | Status: AC
  Filled 2015-02-07: qty 1

## 2015-02-07 MED ORDER — CHLORHEXIDINE GLUCONATE 4 % EX LIQD: 1.0000 "application " | Freq: Once | CUTANEOUS | Status: DC

## 2015-02-07 MED ORDER — ONDANSETRON HCL 4 MG/2ML IJ SOLN: 4.0000 mg | Freq: Once | INTRAMUSCULAR | Status: DC | PRN

## 2015-02-07 MED ORDER — SUCCINYLCHOLINE CHLORIDE 20 MG/ML IJ SOLN
INTRAMUSCULAR | Status: AC
  Filled 2015-02-07: qty 1

## 2015-02-07 MED ORDER — KETOROLAC TROMETHAMINE 30 MG/ML IJ SOLN: 30.0000 mg | Freq: Once | INTRAMUSCULAR | Status: DC

## 2015-02-07 MED ORDER — ACETAMINOPHEN 650 MG RE SUPP: 650.0000 mg | Freq: Four times a day (QID) | RECTAL | Status: DC | PRN

## 2015-02-07 MED ORDER — MIDAZOLAM HCL 2 MG/2ML IJ SOLN
1.0000 mg | INTRAMUSCULAR | Status: DC | PRN
  Administered 2015-02-07: 2 mg via INTRAVENOUS
  Filled 2015-02-07: qty 2

## 2015-02-07 MED ORDER — SUCCINYLCHOLINE CHLORIDE 20 MG/ML IJ SOLN
INTRAMUSCULAR | Status: DC | PRN
  Administered 2015-02-07: 150 mg via INTRAVENOUS

## 2015-02-07 MED ORDER — ACETAMINOPHEN 325 MG PO TABS: 650.0000 mg | ORAL_TABLET | Freq: Four times a day (QID) | ORAL | Status: DC | PRN

## 2015-02-07 MED ORDER — HYDROCODONE-ACETAMINOPHEN 5-325 MG PO TABS: 1.0000 | ORAL_TABLET | ORAL | Status: AC | PRN

## 2015-02-07 MED ORDER — NALOXONE HCL 0.4 MG/ML IJ SOLN
INTRAMUSCULAR | Status: DC | PRN
  Administered 2015-02-07: .1 ug via INTRAVENOUS

## 2015-02-07 MED ORDER — POVIDONE-IODINE 10 % OINT PACKET
TOPICAL_OINTMENT | CUTANEOUS | Status: DC | PRN
  Administered 2015-02-07: 1 via TOPICAL

## 2015-02-07 MED ORDER — FENTANYL CITRATE 0.05 MG/ML IJ SOLN: 25.0000 ug | INTRAMUSCULAR | Status: DC | PRN

## 2015-02-07 MED ORDER — ONDANSETRON HCL 4 MG/2ML IJ SOLN: 4.0000 mg | Freq: Four times a day (QID) | INTRAMUSCULAR | Status: DC | PRN

## 2015-02-07 MED ORDER — FENTANYL CITRATE 0.05 MG/ML IJ SOLN
INTRAMUSCULAR | Status: AC
  Filled 2015-02-07: qty 5

## 2015-02-07 MED ORDER — LACTATED RINGERS IV SOLN
INTRAVENOUS | Status: DC
  Administered 2015-02-07: 10:00:00 via INTRAVENOUS

## 2015-02-07 MED ORDER — SODIUM CHLORIDE 0.9 % IJ SOLN
INTRAMUSCULAR | Status: AC
  Filled 2015-02-07: qty 10

## 2015-02-07 MED ORDER — MIDAZOLAM HCL 5 MG/5ML IJ SOLN
INTRAMUSCULAR | Status: DC | PRN
  Administered 2015-02-07 (×3): 2 mg via INTRAVENOUS

## 2015-02-07 MED ORDER — BUPIVACAINE HCL (PF) 0.5 % IJ SOLN
INTRAMUSCULAR | Status: DC | PRN
  Administered 2015-02-07: 6 mL

## 2015-02-07 MED ORDER — ENOXAPARIN SODIUM 40 MG/0.4ML ~~LOC~~ SOLN: 40.0000 mg | SUBCUTANEOUS | Status: DC

## 2015-02-07 MED ORDER — ONDANSETRON HCL 4 MG/2ML IJ SOLN
4.0000 mg | Freq: Once | INTRAMUSCULAR | Status: AC
  Administered 2015-02-07: 4 mg via INTRAVENOUS
  Filled 2015-02-07: qty 2

## 2015-02-07 MED ORDER — PROPOFOL 10 MG/ML IV BOLUS
INTRAVENOUS | Status: DC | PRN
  Administered 2015-02-07: 160 mg via INTRAVENOUS

## 2015-02-07 MED ORDER — PROPOFOL 10 MG/ML IV EMUL
5.0000 ug/kg/min | INTRAVENOUS | Status: DC
  Administered 2015-02-07: 10 ug/kg/min via INTRAVENOUS

## 2015-02-07 MED ORDER — PROPOFOL 10 MG/ML IV EMUL
INTRAVENOUS | Status: AC
  Filled 2015-02-07: qty 100

## 2015-02-07 MED ORDER — BUPIVACAINE HCL (PF) 0.5 % IJ SOLN
INTRAMUSCULAR | Status: AC
  Filled 2015-02-07: qty 30

## 2015-02-07 MED ORDER — LIDOCAINE HCL 1 % IJ SOLN
INTRAMUSCULAR | Status: DC | PRN
Start: 2015-02-07 — End: 2015-02-07
  Administered 2015-02-07: 30 mg via INTRADERMAL

## 2015-02-07 MED ORDER — CEFAZOLIN SODIUM-DEXTROSE 2-3 GM-% IV SOLR
2.0000 g | INTRAVENOUS | Status: AC
  Administered 2015-02-07: 2 g via INTRAVENOUS
  Filled 2015-02-07: qty 50

## 2015-02-07 MED ORDER — LIDOCAINE HCL (PF) 1 % IJ SOLN
INTRAMUSCULAR | Status: AC
  Filled 2015-02-07: qty 5

## 2015-02-07 MED ORDER — LABETALOL HCL 5 MG/ML IV SOLN
INTRAVENOUS | Status: AC
  Filled 2015-02-07: qty 4

## 2015-02-07 MED ORDER — 0.9 % SODIUM CHLORIDE (POUR BTL) OPTIME
TOPICAL | Status: DC | PRN
  Administered 2015-02-07: 1000 mL

## 2015-02-07 SURGICAL SUPPLY — 35 items
BAG HAMPER (MISCELLANEOUS) ×4 IMPLANT
CHLORAPREP W/TINT 26ML (MISCELLANEOUS) ×4 IMPLANT
CLOTH BEACON ORANGE TIMEOUT ST (SAFETY) ×4 IMPLANT
COVER LIGHT HANDLE STERIS (MISCELLANEOUS) ×8 IMPLANT
DECANTER SPIKE VIAL GLASS SM (MISCELLANEOUS) ×4 IMPLANT
ELECT REM PT RETURN 9FT ADLT (ELECTROSURGICAL) ×4
ELECTRODE REM PT RTRN 9FT ADLT (ELECTROSURGICAL) ×2 IMPLANT
GLOVE BIOGEL PI IND STRL 7.0 (GLOVE) IMPLANT
GLOVE BIOGEL PI INDICATOR 7.0 (GLOVE) ×4
GLOVE EXAM NITRILE LRG STRL (GLOVE) ×2 IMPLANT
GLOVE SS BIOGEL STRL SZ 6.5 (GLOVE) IMPLANT
GLOVE SUPERSENSE BIOGEL SZ 6.5 (GLOVE) ×2
GLOVE SURG SS PI 7.5 STRL IVOR (GLOVE) ×8 IMPLANT
GOWN STRL REUS W/ TWL XL LVL3 (GOWN DISPOSABLE) ×2 IMPLANT
GOWN STRL REUS W/TWL LRG LVL3 (GOWN DISPOSABLE) ×8 IMPLANT
GOWN STRL REUS W/TWL XL LVL3 (GOWN DISPOSABLE) ×4
INST SET MINOR GENERAL (KITS) ×4 IMPLANT
KIT ROOM TURNOVER APOR (KITS) ×4 IMPLANT
MANIFOLD NEPTUNE II (INSTRUMENTS) ×4 IMPLANT
MESH VENTRALEX ST 2.5 CRC MED (Mesh General) ×4 IMPLANT
NDL HYPO 25X1 1.5 SAFETY (NEEDLE) ×2 IMPLANT
NEEDLE HYPO 25X1 1.5 SAFETY (NEEDLE) ×4 IMPLANT
NS IRRIG 1000ML POUR BTL (IV SOLUTION) ×4 IMPLANT
PACK MINOR (CUSTOM PROCEDURE TRAY) ×4 IMPLANT
PAD ARMBOARD 7.5X6 YLW CONV (MISCELLANEOUS) ×4 IMPLANT
SET BASIN LINEN APH (SET/KITS/TRAYS/PACK) ×4 IMPLANT
SPONGE GAUZE 2X2 8PLY STER LF (GAUZE/BANDAGES/DRESSINGS) ×2
SPONGE GAUZE 2X2 8PLY STRL LF (GAUZE/BANDAGES/DRESSINGS) ×6 IMPLANT
STAPLER VISISTAT (STAPLE) ×4 IMPLANT
SUT ETHIBOND NAB MO 7 #0 18IN (SUTURE) ×4 IMPLANT
SUT VIC AB 2-0 CT2 27 (SUTURE) ×4 IMPLANT
SUT VIC AB 3-0 SH 27 (SUTURE) ×4
SUT VIC AB 3-0 SH 27X BRD (SUTURE) ×2 IMPLANT
SYR CONTROL 10ML LL (SYRINGE) ×4 IMPLANT
TAPE CLOTH SURG 4X10 WHT LF (GAUZE/BANDAGES/DRESSINGS) ×2 IMPLANT

## 2015-02-07 NOTE — Addendum Note (Signed)
Addendum  created 02/07/15 1251 by Charmaine Downs, CRNA   Modules edited: Anesthesia Events, Anesthesia LDA, Lines/Drains/Airways Properties Editor, Narrator   Lines/Drains/Airways Properties Editor:  Properties of line/drain/airway/wound [REMOVED] Airway have been modified.; Properties of line/drain/airway/wound Airway have been modified.; Properties of line/drain/airway/wound Airway have been modified.; Properties of line/drain/airway/wound Airway have been modified.; Properties of line/drain/airway/wound [REMOVED] Airway have been modified.   Narrator:  Narrator: Event Log Edited

## 2015-02-07 NOTE — Anesthesia Preprocedure Evaluation (Addendum)
Anesthesia Evaluation  Patient identified by MRN, date of birth, ID band Patient awake    Reviewed: Allergy & Precautions, NPO status , Patient's Chart, lab work & pertinent test results  History of Anesthesia Complications (+) PSEUDOCHOLINESTERASE DEFICIENCY and history of anesthetic complications  Airway Mallampati: I  TM Distance: >3 FB Neck ROM: Full    Dental  (+) Teeth Intact   Pulmonary sleep apnea ,  breath sounds clear to auscultation        Cardiovascular hypertension, Pt. on medications - angina+ CAD and + CABG Rhythm:Regular Rate:Normal     Neuro/Psych    GI/Hepatic GERD-  Medicated and Controlled,  Endo/Other  diabetes, Poorly Controlled, Type 2, Insulin Dependent  Renal/GU      Musculoskeletal   Abdominal   Peds  Hematology   Anesthesia Other Findings   Reproductive/Obstetrics                            Anesthesia Physical Anesthesia Plan  ASA: III  Anesthesia Plan: General   Post-op Pain Management:    Induction: Intravenous, Rapid sequence and Cricoid pressure planned  Airway Management Planned: Oral ETT  Additional Equipment:   Intra-op Plan:   Post-operative Plan: Extubation in OR  Informed Consent: I have reviewed the patients History and Physical, chart, labs and discussed the procedure including the risks, benefits and alternatives for the proposed anesthesia with the patient or authorized representative who has indicated his/her understanding and acceptance.   Dental advisory given  Plan Discussed with:   Anesthesia Plan Comments: ( Note: After induction for this case, pt was found to have pseudocholinesterase deficiency requiring 5 hrs of vent support post oip. This pre-op plan is being flagged with pseudocholinesterase deficiency in the anesthesia complications hx for future reference.)       Anesthesia Quick Evaluation

## 2015-02-07 NOTE — Addendum Note (Signed)
Addendum  created 02/07/15 1217 by Lerry Liner, MD   Modules edited: Clinical Notes   Clinical Notes:  File: [id number]

## 2015-02-07 NOTE — Interval H&P Note (Signed)
History and Physical Interval Note:  02/07/2015 7:20 AM  Ian [name]  has presented today for surgery, with the diagnosis of umbilical hernia  The various methods of treatment have been discussed with the patient and family. After consideration of risks, benefits and other options for treatment, the patient has consented to  Procedure(s): HERNIA REPAIR UMBILICAL ADULT WITH MESH (N/A) as a surgical intervention .  The patient's history has been reviewed, patient examined, no change in status, stable for surgery.  I have reviewed the patient's chart and labs.  Questions were answered to the patient's satisfaction.     Aviva Signs A

## 2015-02-07 NOTE — Transfer of Care (Signed)
Immediate Anesthesia Transfer of Care Note  Patient: Ian [name]  Procedure(s) Performed: Procedure(s): UMBILICAL HERNIORRHAPHY WITH MESH (N/A)  Patient Location: ICU  Anesthesia Type:General  Level of Consciousness: unresponsive  Airway & Oxygen Therapy: Patient remains intubated per anesthesia plan  Post-op Assessment: Report given to RN and Post -op Vital signs reviewed and stable  Post vital signs: Reviewed and stable  Last Vitals:  Filed Vitals:   02/07/15 0730  BP: 148/85  Temp:   Resp: 19    Complications: adverse drug reaction

## 2015-02-07 NOTE — Op Note (Signed)
Patient:  Ian [name]  DOB:  10/11/1956  MRN:  [id number]   Preop Diagnosis:  Umbilical hernia  Postop Diagnosis:  Same  Procedure:  Umbilical herniorrhaphy with mesh  Surgeon:  Aviva Signs, M.D.  Anes:  Gen. endotracheal  Indications:  Patient is a 59 year old white male who presents with an umbilical hernia. The risks and benefits of the procedure including bleeding, infection, mesh use, and the possibility of recurrence of the hernia were fully explained to the patient, who gave informed consent.  Procedure note:  The patient is placed the supine position. After induction of general endotracheal anesthesia, the abdomen was prepped and draped using the usual sterile technique with ChloraPrep. Surgical site confirmation was performed.  An infraumbilical incision was made down to the fascia. The umbilicus was freed away from the underlying hernia sac. Omentum was noted within the hernia sac. The omentum was freed away from its adhesions and reduced. A 2 cm defect was noted in the fascia. A 6.4 cm Bard ventralex ST hernia patch was then inserted and secured to the fascia using 0 Ethibond interrupted sutures. The overlying fascia was reapproximated using 0 Ethibond interrupted sutures. The base the umbilicus was secured back to the fascia using a 2-0 Vicryl interrupted suture. The subcutaneous layer was reapproximated using 3-0 Vicryl interrupted sutures. 0.5% Sensorcaine was instilled the surrounding wound. The skin was closed using staples. Betadine ointment and dry sterile dressings were applied.  All tape and needle counts were correct at the end of the procedure. The patient was extubated in the operating room and transferred to PACU in stable condition.  Complications:  None  EBL:  Minimal  Specimen:  None

## 2015-02-07 NOTE — Anesthesia Procedure Notes (Signed)
Procedure Name: Intubation Date/Time: 02/07/2015 7:42 AM Performed by: Charmaine Downs Pre-anesthesia Checklist: Patient being monitored, Suction available, Emergency Drugs available and Patient identified Patient Re-evaluated:Patient Re-evaluated prior to inductionOxygen Delivery Method: Circle system utilized Preoxygenation: Pre-oxygenation with 100% oxygen Intubation Type: IV induction, Rapid sequence and Cricoid Pressure applied Ventilation: Mask ventilation without difficulty and Oral airway inserted - appropriate to patient size Laryngoscope Size: Mac and 3 Grade View: Grade I Tube type: Oral Tube size: 8.0 mm Number of attempts: 1 Airway Equipment and Method: Stylet and Oral airway Placement Confirmation: positive ETCO2,  ETT inserted through vocal cords under direct vision and breath sounds checked- equal and bilateral Secured at: 23 cm Tube secured with: Tape Dental Injury: Teeth and Oropharynx as per pre-operative assessment

## 2015-02-07 NOTE — Anesthesia Postprocedure Evaluation (Signed)
  Anesthesia Post-op Note  Patient: Ian [name]  Procedure(s) Performed: Procedure(s): UMBILICAL HERNIORRHAPHY WITH MESH (N/A)  In speaking with pt's wife, she relates that his sister may have had a similar episode after surgery. She said he was aware of this, but failed to mention it to [place]. Pt. Stable on vent support with propofol sedation protocol. Will plan to extubate when neuromuscular function returns.

## 2015-02-07 NOTE — Anesthesia Postprocedure Evaluation (Signed)
  Anesthesia Post-op Note  Patient: Ian [name]  Procedure(s) Performed: Procedure(s): UMBILICAL HERNIORRHAPHY WITH MESH (N/A)  Patient Location: ICU  Anesthesia Type:General  Level of Consciousness: sedated and Patient remains intubated per anesthesia plan  Airway and Oxygen Therapy: Patient placed on Ventilator (see vital sign flow sheet for setting)  Post-op Pain: none  Post-op Assessment: Post-op Vital signs reviewed, Patient's Cardiovascular Status Stable and Patent Airway  Post-op Vital Signs: Reviewed and stable  Last Vitals:  Filed Vitals:   02/07/15 0730  BP: 148/85  Temp:   Resp: 19    Complications: adverse drug reaction

## 2015-02-07 NOTE — Discharge Instructions (Signed)
Open Hernia Repair, Care After Refer to this sheet in the next few weeks. These instructions provide you with information on caring for yourself after your procedure. Your health care provider may also give you more specific instructions. Your treatment has been planned according to current medical practices, but problems sometimes occur. Call your health care provider if you have any problems or questions after your procedure. WHAT TO EXPECT AFTER THE PROCEDURE After your procedure, it is typical to have the following:  Pain in your abdomen, especially along your incision. You will be given pain medicines to control the pain.  Constipation. You may be given a stool softener to help prevent this. HOME CARE INSTRUCTIONS   Only take over-the-counter or prescription medicines as directed by your health care provider.  Keep the wound dry and clean. You may wash the wound gently with soap and water 48 hours after surgery. Gently blot or dab the wound dry. Do not take baths, use swimming pools, or use hot tubs for 10 days or until your health care provider approves.  Change bandages (dressings) as directed by your health care provider.  Continue your normal diet as directed by your health care provider. Eat plenty of fruits and vegetables to help prevent constipation.  Drink enough fluids to keep your urine clear or pale yellow. This also helps prevent constipation.  Do not drive until your health care provider says it is okay.  Do not lift anything heavier than 10 pounds (4.5 kg) or play contact sports for 4 weeks or until your health care provider approves.  Follow up with your health care provider as directed. Ask your health care provider when to make an appointment to have your stitches (sutures) or staples removed. SEEK MEDICAL CARE IF:   You have increased bleeding coming from the incision site.  You have blood in your stool.  You have increasing pain in the wound.  You see redness  or swelling in the wound.  You have fluid (pus) coming from the wound.  You have a fever.  You notice a bad smell coming from the wound or dressing. SEEK IMMEDIATE MEDICAL CARE IF:   You develop a rash.  You have chest pain or shortness of breath.  You feel lightheaded or feel faint. Document Released: 05/25/2005 Document Revised: 08/26/2013 Document Reviewed: 06/17/2013 [facility] Patient Information 2015 [place], [place]. This information is not intended to replace advice given to you by your health care provider. Make sure you discuss any questions you have with your health care provider.   Succinylcholine injection What is this medicine? Succinylcholine (SUK seh nil KOH leen) is a skeletal muscle relaxant. It is used to relax muscles during surgery or while on a breathing machine. This medicine may be used for other purposes; ask your health care provider or pharmacist if you have questions. COMMON BRAND NAME(S): Anectine, Quelicin What should I tell my health care provider before I take this medicine? They need to know if you have any of these conditions: -glaucoma -high levels of calcium, magnesium, potassium in the blood -history of nerve or muscle disease -large areas of burned or damaged skin -low levels of calcium in the blood -low levels of potassium in the blood -lung or breathing disease, like asthma -pseudocholinesterase deficiency -severely injured -an unusual or allergic reaction to succinylcholine, other medicines, foods, dyes, or preservatives -pregnant or trying to get pregnant -breast-feeding How should I use this medicine? This medicine is for injection into a vein or muscle or  for infusion into a vein. It is given by a health care professional in a hospital or clinic setting. Talk to your pediatrician regarding the use of this medicine in children. While this drug may be prescribed for selected conditions, precautions do apply. Overdosage: If you think you  have taken too much of this medicine contact a poison control center or emergency room at once. NOTE: This medicine is only for you. Do not share this medicine with others. What if I miss a dose? This does not apply. What may interact with this medicine? This medicine may interact with the following medications: -birth control pills -certain medicines for blood pressure, heart disease, irregular heart beat -certain medicines for infection like amikacin, capreomycin, clindamycin, gentamicin, lincomycin, polymyxin B, tobramycin, vancomycin -certain medicines for stomach problems like metoclopramide -general anesthetics -magnesium -narcotic medicines for pain -phenelzine -some medicines for cancer This list may not describe all possible interactions. Give your health care provider a list of all the medicines, herbs, non-prescription drugs, or dietary supplements you use. Also tell them if you smoke, drink alcohol, or use illegal drugs. Some items may interact with your medicine. What should I watch for while using this medicine? Your condition will be monitored carefully while you are receiving this medicine. What side effects may I notice from receiving this medicine? Side effects that you should report to your doctor or health care professional as soon as possible: -allergic reactions like skin rash, itching or hives, swelling of the face, lips, or tongue -signs and symptoms of a dangerous change in heartbeat or heart rhythm like chest pain; dizziness; fast or irregular heartbeat; palpitations; feeling faint or lightheaded, falls; breathing problems -signs and symptoms of increased potassium like muscle weakness; chest pain; or fast, irregular heartbeat -signs and symptoms of muscle injury like dark urine; trouble passing urine or change in the amount of urine; unusually weak or tired; muscle pain or side or back pain Side effects that usually do not require medical attention (report these to  your doctor or health care professional if they continue or are bothersome): -excessive saliva -muscle cramps -muscle pain This list may not describe all possible side effects. Call your doctor for medical advice about side effects. You may report side effects to FDA at 1-800-FDA-1088. Where should I keep my medicine? This drug is given in a hospital or clinic and will not be stored at home. NOTE: This sheet is a summary. It may not cover all possible information. If you have questions about this medicine, talk to your doctor, pharmacist, or health care provider.  2015, [employer]vier/Gold Standard. (2014-01-05 09:33:53)

## 2015-02-07 NOTE — Progress Notes (Signed)
Patient is still paralyzed after receiving succinylcholine at the time of induction. It is suspected that he has a pseudocholinesterase deficiency. He'll be admitted to the intensive care unit to allow the succinylcholine to wear off. Wife has been made aware. The patient's vital signs are stable.

## 2015-02-07 NOTE — Progress Notes (Signed)
Orders received for discharge; VSS; provided discharge teaching including medication review to pt and wife; understanding verbalized; IV removed; pt taken out via [place] by NT with all belongings.

## 2015-02-07 NOTE — Anesthesia Postprocedure Evaluation (Signed)
  Anesthesia Post-op Note  Patient: Ian [name]  Procedure(s) Performed: Procedure(s): UMBILICAL HERNIORRHAPHY WITH MESH (N/A)  Patient Location: ICU  Anesthesia Type:General  Level of Consciousness: awake, alert , oriented and patient cooperative  Airway and Oxygen Therapy: Patient Spontanous Breathing and Patient connected to face mask oxygen  Post-op Pain: 3 /10, mild  Post-op Assessment: Post-op Vital signs reviewed, Patient's Cardiovascular Status Stable, Respiratory Function Stable, Patent Airway and No signs of Nausea or vomiting  Post-op Vital Signs: Reviewed and stable  Last Vitals:  Filed Vitals:   02/07/15 1130  BP: 168/89  Pulse: 92  Temp:   Resp: 16    Complications: No apparent anesthesia complications

## 2015-02-08 ENCOUNTER — Encounter (HOSPITAL_COMMUNITY): Payer: Self-pay | Admitting: General Surgery

## 2015-02-08 NOTE — Care Management Utilization Note (Signed)
UR completed 

## 2015-02-14 NOTE — Discharge Summary (Signed)
Physician Discharge Summary  Patient ID: Ian [name] MRN: [id number] DOB/AGE: Jul 18, 1956 59 y.o.  Admit date: 02/07/2015 Discharge date: 02/07/2015 Admission Diagnoses: Umbilical hernia, postoperative delay in discharge due to prolonged chemical paralysis from succinylcholine  Discharge Diagnoses: Same, pseudocholinesterase deficiency Active Problems:   Dysfunctional ventilatory weaning response   Discharged Condition: good  Hospital Course: Patient is a 59 year old white male who underwent an umbilical herniorrhaphy with mesh on 02/07/2015. At the end of the procedure, the patient could not be extubated due to ongoing paralysis secondary to succinylcholine. He was transferred intubated to the ICU until his chemical paralysis had resolved. In discussion with the family after the fact, he had a sister who had a similar episode after undergoing general anesthesia. Is suspected he has a pseudocholinesterase deficiency. After approximately 3-4 hours, his paralysis resolved and he was able to be extubated. He is being discharged home in good condition. He and the family have been given information about this in order to warm them about any future use of succinylcholine during general anesthesia.   Treatments: surgery: Umbilical herniorrhaphy with mesh on 02/07/2015  Discharge Exam: Blood pressure 132/70, pulse 84, temperature 98.1 F (36.7 C), temperature source Axillary, resp. rate 18, height 5\' 4"  (1.626 m), weight 94.6 kg (208 lb 8.9 oz), SpO2 96 %. General appearance: alert, cooperative and no distress Resp: clear to auscultation bilaterally Cardio: regular rate and rhythm, S1, S2 normal, no murmur, click, rub or gallop GI: Soft, dressing dry and intact.  Disposition: 01-Home or Self Care     Medication List    TAKE these medications        aspirin EC 81 MG tablet  Take 1 tablet (81 mg total) by mouth daily.     CoQ-10 100 MG Caps  Take 1 capsule by mouth daily.     diltiazem 240 MG 24 hr capsule  Commonly known as:  CARDIZEM CD  Take 240 mg by mouth daily.     ezetimibe-simvastatin 10-80 MG per tablet  Commonly known as:  VYTORIN  Take 1 tablet by mouth at bedtime.     fish oil-omega-3 fatty acids 1000 MG capsule  Take 1 g by mouth daily.     hydrochlorothiazide 12.5 MG capsule  Commonly known as:  MICROZIDE  Take 12.5 mg by mouth daily.     HYDROcodone-acetaminophen 5-325 MG per tablet  Commonly known as:  NORCO/VICODIN  Take 1-2 tablets by mouth every 4 (four) hours as needed for moderate pain.     insulin lispro 100 UNIT/ML injection  Commonly known as:  HUMALOG  Inject 0-15 Units into the skin 3 (three) times daily before meals. Sliding Scale     INVOKANA 300 MG Tabs tablet  Generic drug:  canagliflozin  Take 300 mg by mouth daily.     LANTUS SOLOSTAR 100 UNIT/ML injection  Generic drug:  insulin glargine  Inject 50 Units into the skin at bedtime.     metFORMIN 1000 MG tablet  Commonly known as:  GLUCOPHAGE  Take 1,000 mg by mouth 2 (two) times daily with a meal.     metoprolol succinate 50 MG 24 hr tablet  Commonly known as:  TOPROL-XL  Take 75 mg by mouth 2 (two) times daily.     niacin 500 MG CR tablet  Commonly known as:  NIASPAN  Take 500 mg by mouth at bedtime.     pantoprazole 40 MG tablet  Commonly known as:  PROTONIX  Take 40 mg by mouth daily.  ramipril 10 MG capsule  Commonly known as:  ALTACE  Take 10 mg by mouth daily.     TRADJENTA 5 MG Tabs tablet  Generic drug:  linagliptin  Take 1 tablet by mouth daily.           Follow-up Information    Follow up with Jamesetta So, MD. Schedule an appointment as soon as possible for a visit on 02/17/2015.   Specialty:  General Surgery   Contact information:   [address] [id number] [phone]       Signed: Aviva Signs A 02/14/2015, 8:12 AM

## 2015-12-01 ENCOUNTER — Encounter (INDEPENDENT_AMBULATORY_CARE_PROVIDER_SITE_OTHER): Payer: Self-pay | Admitting: *Deleted

## 2016-07-24 ENCOUNTER — Ambulatory Visit
Admission: EM | Admit: 2016-07-24 | Discharge: 2016-07-24 | Disposition: A | Discharge status: Home / Self Care | Payer: Worker's Compensation | Attending: Family Medicine | Admitting: Family Medicine

## 2016-07-24 ENCOUNTER — Encounter: Payer: Self-pay | Admitting: *Deleted

## 2016-07-24 DIAGNOSIS — S39011A Strain of muscle, fascia and tendon of abdomen, initial encounter: Secondary | ICD-10-CM

## 2016-07-24 DIAGNOSIS — S29009A Unspecified injury of muscle and tendon of unspecified wall of thorax, initial encounter: Secondary | ICD-10-CM | POA: Diagnosis not present

## 2016-07-24 DIAGNOSIS — S29019A Strain of muscle and tendon of unspecified wall of thorax, initial encounter: Secondary | ICD-10-CM

## 2016-07-24 MED ORDER — CYCLOBENZAPRINE HCL 10 MG PO TABS: 10.0000 mg | ORAL_TABLET | Freq: Every day | ORAL | 0 refills | Status: DC

## 2016-07-24 NOTE — Discharge Instructions (Signed)
Take over the counter tylenol or advil as needed Apply ice

## 2016-07-24 NOTE — ED Triage Notes (Signed)
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 Patient is also experiencing back pain from shoulders up into his neck. Patient has a history of hernia surgery.

## 2016-07-24 NOTE — ED Provider Notes (Signed)
[facility]    CSN: [id number] Arrival date & time: 07/24/16  1039  First Provider Contact:  First MD Initiated Contact with Patient 07/24/16 1211        History   Chief Complaint Chief Complaint  Patient presents with  . Back Pain  . Hernia    HPI Ian [name] is a 60 y.o. male.   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 States felt sudden onset of pain. Concerned about abdominal pain being at the site of a prior abdominal hernia repair (done in March 2017). States since the incident this morning the abdominal pain has subsided and now almost completely resolved. Denies any fevers, chills, vomiting, numbness/tingling.    The history is provided by the patient.    Past Medical History:  Diagnosis Date  . 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 Complication of anesthesia    atypical pseudo-cholinesterase deficiency  . Diabetes mellitus without complication ([place])   . Family history of adverse reaction to anesthesia    sister also has atypical pseudo-cholinesterase deficiency  . GERD (gastroesophageal reflux disease)   . History of heart bypass surgery   . Hypertension     Patient Active Problem List   Diagnosis Date Noted  . Dysfunctional ventilatory weaning response ([place]) 02/07/2015  . Palpitations, recurrent 01/10/2013  . Obesity 01/10/2013  . HTN (hypertension) 01/10/2013  . CAD in native artery 01/10/2013  . Other and unspecified hyperlipidemia 01/10/2013  . OSA (obstructive sleep apnea), probable 01/10/2013  . Diabetes ([place]) 01/10/2013  . 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 Chest tightness 01/10/2013    Past Surgical History:  Procedure Laterality Date  . CORONARY ARTERY BYPASS GRAFT     4 vessels  . heart bypass    . INSERTION OF MESH  02/07/2015   Procedure: INSERTION OF MESH;  Surgeon: Aviva Signs Md, MD;   Location: [facility];  Service: General;;  . UMBILICAL HERNIA REPAIR N/A 02/07/2015   Procedure: UMBILICAL HERNIORRHAPHY WITH MESH;  Surgeon: Aviva Signs Md, MD;  Location: [facility];  Service: General;  Laterality: N/A;       Home Medications    Prior to Admission medications   Medication Sig Start Date End Date Taking? Authorizing Provider  aspirin EC 81 MG tablet Take 1 tablet (81 mg total) by mouth daily. 01/10/13  Yes Rexene Alberts, MD  Coenzyme Q10 (COQ-10) 100 MG CAPS Take 1 capsule by mouth daily.   Yes Historical Provider, MD  diltiazem (CARDIZEM CD) 240 MG 24 hr capsule Take 240 mg by mouth daily.   Yes Historical Provider, MD  ezetimibe-simvastatin (VYTORIN) 10-80 MG per tablet Take 1 tablet by mouth at bedtime.   Yes Historical Provider, MD  fish oil-omega-3 fatty acids 1000 MG capsule Take 1 g by mouth daily.   Yes Historical Provider, MD  hydrochlorothiazide (MICROZIDE) 12.5 MG capsule Take 12.5 mg by mouth daily.   Yes Historical Provider, MD  insulin glargine (LANTUS SOLOSTAR) 100 UNIT/ML injection Inject 50 Units into the skin at bedtime.   Yes Historical Provider, MD  insulin lispro (HUMALOG) 100 UNIT/ML injection Inject 0-15 Units into the skin 3 (three) times daily before meals. Sliding Scale   Yes Historical Provider, MD  INVOKANA 300 MG TABS tablet Take 300 mg by mouth daily. 02/01/15  Yes Historical Provider, MD  metFORMIN (GLUCOPHAGE) 1000 MG tablet  Take 1,000 mg by mouth 2 (two) times daily with a meal. 02/01/15  Yes Historical Provider, MD  metoprolol succinate (TOPROL-XL) 50 MG 24 hr tablet Take 75 mg by mouth 2 (two) times daily.   Yes Historical Provider, MD  niacin (NIASPAN) 500 MG CR tablet Take 500 mg by mouth at bedtime.   Yes Historical Provider, MD  pantoprazole (PROTONIX) 40 MG tablet Take 40 mg by mouth daily.   Yes Historical Provider, MD  ramipril (ALTACE) 10 MG capsule Take 10 mg by mouth daily.   Yes Historical Provider, MD  TRADJENTA 5 MG TABS tablet Take 1  tablet by mouth daily.  02/01/15  Yes Historical Provider, MD  cyclobenzaprine (FLEXERIL) 10 MG tablet Take 1 tablet (10 mg total) by mouth at bedtime. 07/24/16   Norval Gable, MD    Family History Family History  Problem Relation Age of Onset  . Hypertension Father   . Diabetes Father   . Hypertension Sister   . Diabetes Sister   . Hypertension Brother   . Diabetes Brother     Social History Social History  Substance Use Topics  . Smoking status: Never Smoker  . Smokeless tobacco: Never Used  . Alcohol use No     Allergies   Anectine [succinylcholine]   Review of Systems Review of Systems   Physical Exam Triage Vital Signs ED Triage Vitals  Enc Vitals Group     BP 07/24/16 1124 (!) 163/73     Pulse Rate 07/24/16 1124 77     Resp 07/24/16 1124 18     Temp 07/24/16 1124 98.1 F (36.7 C)     Temp Source 07/24/16 1124 Oral     SpO2 07/24/16 1124 99 %     Weight 07/24/16 1125 212 lb (96.2 kg)     Height 07/24/16 1125 5\' 4"  (1.626 m)     Head Circumference --      Peak Flow --      Pain Score 07/24/16 1130 4     Pain Loc --      Pain Edu? --      Excl. in [place]? --    No data found.   Updated Vital Signs BP (!) 163/73 (BP Location: Left Arm)   Pulse 77   Temp 98.1 F (36.7 C) (Oral)   Resp 18   Ht 5\' 4"  (1.626 m)   Wt 212 lb (96.2 kg)   SpO2 99%   BMI 36.39 kg/m   Visual Acuity Right Eye Distance:   Left Eye Distance:   Bilateral Distance:    Right Eye Near:   Left Eye Near:    Bilateral Near:     Physical Exam  Constitutional: He appears well-developed and well-nourished. No distress.  Neck: Normal range of motion. Neck supple. No tracheal deviation present.  Pulmonary/Chest: Effort normal. No stridor. No respiratory distress.  Abdominal: Soft. Bowel sounds are normal. He exhibits no distension and no mass. There is tenderness (mild, right periumbilical; no hernia). There is no rebound and no guarding. No hernia.  Musculoskeletal:        Cervical back: He exhibits normal range of motion, no tenderness, no bony tenderness, no swelling, no edema, no deformity, no laceration, no pain, no spasm and normal pulse.       Thoracic back: He exhibits tenderness (over the paraspinous muscles) and spasm. He exhibits no bony tenderness, no swelling, no edema, no deformity, no laceration and no pain.  Lumbar back: He exhibits normal range of motion, no tenderness, no bony tenderness, no swelling, no edema, no deformity, no laceration, no pain, no spasm and normal pulse.  Neurological: He is alert. He has normal reflexes. He displays normal reflexes. He exhibits normal muscle tone. Coordination normal.  Skin: No rash noted. He is not diaphoretic.  Nursing note and vitals reviewed.    UC Treatments / Results  Labs (all labs ordered are listed, but only abnormal results are displayed) Labs Reviewed - No data to display  EKG  EKG Interpretation None       Radiology No results found.  Procedures Procedures (including critical care time)  Medications Ordered in UC Medications - No data to display   Initial Impression / Assessment and Plan / UC Course  I have reviewed the triage vital signs and the nursing notes.  Pertinent labs & imaging results that were available during my care of the patient were reviewed by me and considered in my medical decision making (see chart for details).  Clinical Course      Final Clinical Impressions(s) / UC Diagnoses   Final diagnoses:  Thoracic myofascial strain, initial encounter  Abdominal muscle strain, initial encounter    New Prescriptions Discharge Medication List as of 07/24/2016 12:27 PM    START taking these medications   Details  cyclobenzaprine (FLEXERIL) 10 MG tablet Take 1 tablet (10 mg total) by mouth at bedtime., Starting Tue 07/24/2016, Normal         Norval Gable, MD 07/24/16 [phone]

## 2018-06-16 ENCOUNTER — Ambulatory Visit (INDEPENDENT_AMBULATORY_CARE_PROVIDER_SITE_OTHER): Payer: Self-pay

## 2018-06-16 DIAGNOSIS — Z1211 Encounter for screening for malignant neoplasm of colon: Secondary | ICD-10-CM

## 2018-06-16 MED ORDER — PEG 3350-KCL-NA BICARB-NACL 420 G PO SOLR: 4000.0000 mL | ORAL | 0 refills | Status: DC

## 2018-06-16 NOTE — Progress Notes (Signed)
Gastroenterology Pre-Procedure Review  Request Date:06/16/18 Requesting Physician: Rowan Blase PA-C [place] ( no previous tcs)  PATIENT REVIEW QUESTIONS: The patient responded to the following health history questions as indicated:    1. Diabetes Melitis: yes (lantus 50u qhs, humalog- SS, metformin 1000mg  bid, invokana 300qd, tradjenta 5mg  qd) 2. Joint replacements in the past 12 months: no 3. Major health problems in the past 3 months: no 4. Has an artificial valve or MVP: no 5. Has a defibrillator: no 6. Has been advised in past to take antibiotics in advance of a procedure like teeth cleaning: no 7. Family history of colon cancer: no  8. Alcohol Use: no 9. History of sleep apnea: no  10. History of coronary artery or other vascular stents placed within the last 12 months: no 11. History of any prior anesthesia complications: yes (pt cannot have anectine- had a hard time waking up from it.)    MEDICATIONS & ALLERGIES:    Patient reports the following regarding taking any blood thinners:   Plavix? no Aspirin? yes (81mg ) Coumadin? no Brilinta? no Xarelto? no Eliquis? no Pradaxa? no Savaysa? no Effient? no  Patient confirms/reports the following medications:  Current Outpatient Medications  Medication Sig Dispense Refill  . aspirin EC 81 MG tablet Take 1 tablet (81 mg total) by mouth daily.    Marland Kitchen diltiazem (CARDIZEM CD) 240 MG 24 hr capsule Take 240 mg by mouth daily.    Marland Kitchen ezetimibe-simvastatin (VYTORIN) 10-80 MG per tablet Take 1 tablet by mouth at bedtime.    . hydrochlorothiazide (MICROZIDE) 12.5 MG capsule Take 12.5 mg by mouth daily.    . insulin glargine (LANTUS SOLOSTAR) 100 UNIT/ML injection Inject 50 Units into the skin at bedtime.    . insulin lispro (HUMALOG) 100 UNIT/ML injection Inject 0-15 Units into the skin 3 (three) times daily before meals. Sliding Scale    . INVOKANA 300 MG TABS tablet Take 300 mg by mouth daily.    . metFORMIN (GLUCOPHAGE) 1000 MG tablet  Take 1,000 mg by mouth 2 (two) times daily with a meal.    . metoprolol succinate (TOPROL-XL) 50 MG 24 hr tablet Take 75 mg by mouth 2 (two) times daily.    . niacin (NIASPAN) 500 MG CR tablet Take 500 mg by mouth at bedtime.    . pantoprazole (PROTONIX) 40 MG tablet Take 40 mg by mouth daily.    . ramipril (ALTACE) 10 MG capsule Take 10 mg by mouth daily.    . TRADJENTA 5 MG TABS tablet Take 1 tablet by mouth daily.      No current facility-administered medications for this visit.     Patient confirms/reports the following allergies:  Allergies  Allergen Reactions  . Anectine [Succinylcholine] Other (See Comments)    Atypical pseudocholinesterase deficiency.     No orders of the defined types were placed in this encounter.   AUTHORIZATION INFORMATION Primary Insurance: Loralee Pacas #: [id number] Pre-Cert / Josem Kaufmann required: no   SCHEDULE INFORMATION: Procedure has been scheduled as follows:  Date: 08/29/18, Time: 9:30 Location: [facility]  This Gastroenterology Pre-Precedure Review Form is being routed to the following provider(s): Walden Field NP

## 2018-06-16 NOTE — Patient Instructions (Signed)
Ian [name]   [birth year] MRN: [id number]    Procedure Date: 08/29/18  Time to register: 8:30am Place to register: [facility] Procedure Time: 9:30am Scheduled provider: R. Garfield Cornea, MD  PREPARATION FOR COLONOSCOPY WITH TRI-LYTE SPLIT PREP  Please notify us immediately if you are diabetic, take iron supplements, or if you are on Coumadin or any other blood thinners.   Please hold the following medications: I will mail you a letter with this information   You will need to purchase 1 fleet enema and 1 box of Bisacodyl '5mg'$  tablets.   2 DAYS BEFORE PROCEDURE:  DATE: 08/27/18   DAY: Wednesday Begin clear liquid diet AFTER your lunch meal. NO SOLID FOODS after this point.  1 DAY BEFORE PROCEDURE:  DATE: 08/28/18   DAY: Thursday Continue clear liquids the entire day - NO SOLID FOOD.   Diabetic medications adjustments for today: see letter  At 2:00 pm:  Take 2 Bisacodyl tablets.   At 4:00pm:  Start drinking your solution. Make sure you mix well per instructions on the bottle. Try to drink 1 (one) 8 ounce glass every 10-15 minutes until you have consumed HALF the jug. You should complete by 6:00pm.You must keep the left over solution refrigerated until completed next day.  Continue clear liquids. You must drink plenty of clear liquids to prevent dehyration and kidney failure.     DAY OF PROCEDURE:   DATE: 08/29/18  DAY: Friday If you take medications for your heart, blood pressure or breathing, you may take these medications.  Diabetic medications adjustments for today: see letter  Five hours before your procedure time @ 4:30am:  Finish remaining amout of bowel prep, drinking 1 (one) 8 ounce glass every 10-15 minutes until complete. You have two hours to consume remaining prep.   Three hours before your procedure time '@6'$ :30am:  Nothing by mouth.   At least one hour before going to the hospital:  Give yourself one Fleet enema. You may take your morning medications  with sip of water unless we have instructed otherwise.      Please see below for Dietary Information.  CLEAR LIQUIDS INCLUDE:  Water Jello (NOT red in color)   Ice Popsicles (NOT red in color)   Tea (sugar ok, no milk/cream) Powdered fruit flavored drinks  Coffee (sugar ok, no milk/cream) Gatorade/ Lemonade/ Kool-Aid  (NOT red in color)   Juice: apple, white grape, white cranberry Soft drinks  Clear bullion, consomme, broth (fat free beef/chicken/vegetable)  Carbonated beverages (any kind)  Strained chicken noodle soup Hard Candy   Remember: Clear liquids are liquids that will allow you to see your fingers on the other side of a clear glass. Be sure liquids are NOT red in color, and not cloudy, but CLEAR.  DO NOT EAT OR DRINK ANY OF THE FOLLOWING:  Dairy products of any kind   Cranberry juice Tomato juice / V8 juice   Grapefruit juice Orange juice     Red grape juice  Do not eat any solid foods, including such foods as: cereal, oatmeal, yogurt, fruits, vegetables, creamed soups, eggs, bread, crackers, pureed foods in a blender, etc.   HELPFUL HINTS FOR DRINKING PREP SOLUTION:   Make sure prep is extremely cold. Mix and refrigerate the the morning of the prep. You may also put in the freezer.   You may try mixing some Crystal Light or Country Time Lemonade if you prefer. Mix in small amounts; add more if necessary.  Try drinking  through a straw  Rinse mouth with water or a mouthwash between glasses, to remove after-taste.  Try sipping on a cold beverage /ice/ popsicles between glasses of prep.  Place a piece of sugar-free hard candy in mouth between glasses.  If you become nauseated, try consuming smaller amounts, or stretch out the time between glasses. Stop for 30-60 minutes, then slowly start back drinking.        OTHER INSTRUCTIONS  You will need a responsible adult at least 61 years of age to accompany you and drive you home. This person must remain in the  waiting room during your procedure. The hospital will cancel your procedure if you do not have a responsible adult with you.   1. Wear loose fitting clothing that is easily removed. 2. Leave jewelry and other valuables at home.  3. Remove all body piercing jewelry and leave at home. 4. Total time from sign-in until discharge is approximately 2-3 hours. 5. You should go home directly after your procedure and rest. You can resume normal activities the day after your procedure. 6. The day of your procedure you should not:  Drive  Make legal decisions  Operate machinery  Drink alcohol  Return to work   You may call the office (Dept: [phone]) before 5:00pm, or page the doctor on call [phone]) after 5:00pm, for further instructions, if necessary.   Insurance Information YOU WILL NEED TO CHECK WITH YOUR INSURANCE COMPANY FOR THE BENEFITS OF COVERAGE YOU HAVE FOR THIS PROCEDURE.  UNFORTUNATELY, NOT ALL INSURANCE COMPANIES HAVE BENEFITS TO COVER ALL OR PART OF THESE TYPES OF PROCEDURES.  IT IS YOUR RESPONSIBILITY TO CHECK YOUR BENEFITS, HOWEVER, WE WILL BE GLAD TO ASSIST YOU WITH ANY CODES YOUR INSURANCE COMPANY MAY NEED.    PLEASE NOTE THAT MOST INSURANCE COMPANIES WILL NOT COVER A SCREENING COLONOSCOPY FOR PEOPLE UNDER THE AGE OF 50  IF YOU HAVE BCBS INSURANCE, YOU MAY HAVE BENEFITS FOR A SCREENING COLONOSCOPY BUT IF POLYPS ARE FOUND THE DIAGNOSIS WILL CHANGE AND THEN YOU MAY HAVE A DEDUCTIBLE THAT WILL NEED TO BE MET. SO PLEASE MAKE SURE YOU CHECK YOUR BENEFITS FOR A SCREENING COLONOSCOPY AS WELL AS A DIAGNOSTIC COLONOSCOPY.

## 2018-06-20 NOTE — Progress Notes (Signed)
Ok to schedule.  DM meds: half night before and none morning of.  On prep day: Check CBG ac and hs as well as if the patient feels like their blood sugar is off. Can use soda, juice (that's in [place]) as needed for any low blood sugar.  Check CBG on arrival to endo unit.

## 2018-06-23 NOTE — Progress Notes (Signed)
Letter mailed to the pt with DM medication adjustments.

## 2018-08-28 ENCOUNTER — Telehealth: Payer: Self-pay | Admitting: Internal Medicine

## 2018-08-28 NOTE — Telephone Encounter (Signed)
Pt said he is scheduled colonoscopy for tomorrow and [facility] doesn't have his prep prescription. It was sent back in July so it's probably put away. Please call in prep prescription again.

## 2018-08-28 NOTE — Telephone Encounter (Signed)
I called [facility], they had his rx on file and the pharmacist said he would get it ready for him.

## 2018-08-29 ENCOUNTER — Encounter (HOSPITAL_COMMUNITY): Payer: Self-pay | Admitting: *Deleted

## 2018-08-29 ENCOUNTER — Encounter (HOSPITAL_COMMUNITY)
Admission: RE | Disposition: A | Discharge status: Home / Self Care | Payer: Self-pay | Source: Ambulatory Visit | Attending: Internal Medicine

## 2018-08-29 ENCOUNTER — Ambulatory Visit (HOSPITAL_COMMUNITY)
Admission: RE | Admit: 2018-08-29 | Discharge: 2018-08-29 | Disposition: A | Discharge status: Home / Self Care | Payer: 59 | Source: Ambulatory Visit | Attending: Internal Medicine | Admitting: Internal Medicine

## 2018-08-29 ENCOUNTER — Other Ambulatory Visit: Payer: Self-pay

## 2018-08-29 DIAGNOSIS — E119 Type 2 diabetes mellitus without complications: Secondary | ICD-10-CM | POA: Insufficient documentation

## 2018-08-29 DIAGNOSIS — I10 Essential (primary) hypertension: Secondary | ICD-10-CM | POA: Insufficient documentation

## 2018-08-29 DIAGNOSIS — Z79899 Other long term (current) drug therapy: Secondary | ICD-10-CM | POA: Insufficient documentation

## 2018-08-29 DIAGNOSIS — D125 Benign neoplasm of sigmoid colon: Secondary | ICD-10-CM | POA: Diagnosis not present

## 2018-08-29 DIAGNOSIS — D122 Benign neoplasm of ascending colon: Secondary | ICD-10-CM

## 2018-08-29 DIAGNOSIS — C187 Malignant neoplasm of sigmoid colon: Secondary | ICD-10-CM | POA: Diagnosis not present

## 2018-08-29 DIAGNOSIS — Z794 Long term (current) use of insulin: Secondary | ICD-10-CM | POA: Diagnosis not present

## 2018-08-29 DIAGNOSIS — D124 Benign neoplasm of descending colon: Secondary | ICD-10-CM | POA: Diagnosis not present

## 2018-08-29 DIAGNOSIS — K219 Gastro-esophageal reflux disease without esophagitis: Secondary | ICD-10-CM | POA: Insufficient documentation

## 2018-08-29 DIAGNOSIS — Z7982 Long term (current) use of aspirin: Secondary | ICD-10-CM | POA: Insufficient documentation

## 2018-08-29 DIAGNOSIS — D123 Benign neoplasm of transverse colon: Secondary | ICD-10-CM | POA: Diagnosis not present

## 2018-08-29 DIAGNOSIS — Z951 Presence of aortocoronary bypass graft: Secondary | ICD-10-CM | POA: Diagnosis not present

## 2018-08-29 DIAGNOSIS — Z1211 Encounter for screening for malignant neoplasm of colon: Secondary | ICD-10-CM | POA: Diagnosis present

## 2018-08-29 DIAGNOSIS — Z888 Allergy status to other drugs, medicaments and biological substances status: Secondary | ICD-10-CM | POA: Insufficient documentation

## 2018-08-29 HISTORY — PX: POLYPECTOMY: SHX5525

## 2018-08-29 HISTORY — PX: COLONOSCOPY: SHX5424

## 2018-08-29 LAB — GLUCOSE, CAPILLARY: Glucose-Capillary: 175 mg/dL — ABNORMAL HIGH (ref 70–99)

## 2018-08-29 SURGERY — COLONOSCOPY
Anesthesia: Moderate Sedation

## 2018-08-29 MED ORDER — MIDAZOLAM HCL 5 MG/5ML IJ SOLN
INTRAMUSCULAR | Status: AC
  Filled 2018-08-29: qty 10

## 2018-08-29 MED ORDER — MIDAZOLAM HCL 5 MG/5ML IJ SOLN
INTRAMUSCULAR | Status: DC | PRN
  Administered 2018-08-29 (×3): 1 mg via INTRAVENOUS
  Administered 2018-08-29: 2 mg via INTRAVENOUS
  Administered 2018-08-29: 1 mg via INTRAVENOUS

## 2018-08-29 MED ORDER — ONDANSETRON HCL 4 MG/2ML IJ SOLN
INTRAMUSCULAR | Status: DC | PRN
  Administered 2018-08-29: 4 mg via INTRAVENOUS

## 2018-08-29 MED ORDER — ONDANSETRON HCL 4 MG/2ML IJ SOLN
INTRAMUSCULAR | Status: AC
  Filled 2018-08-29: qty 2

## 2018-08-29 MED ORDER — STERILE WATER FOR IRRIGATION IR SOLN
Status: DC | PRN
  Administered 2018-08-29: 09:00:00

## 2018-08-29 MED ORDER — MEPERIDINE HCL 100 MG/ML IJ SOLN
INTRAMUSCULAR | Status: DC | PRN
  Administered 2018-08-29: 10 mg
  Administered 2018-08-29 (×2): 15 mg
  Administered 2018-08-29: 25 mg

## 2018-08-29 MED ORDER — SODIUM CHLORIDE 0.9 % IV SOLN
INTRAVENOUS | Status: DC
  Administered 2018-08-29: 09:00:00 via INTRAVENOUS

## 2018-08-29 MED ORDER — MEPERIDINE HCL 50 MG/ML IJ SOLN
INTRAMUSCULAR | Status: AC
  Filled 2018-08-29: qty 1

## 2018-08-29 NOTE — Op Note (Signed)
[facility] Patient Name: Ian [name] Procedure Date: 08/29/2018 8:32 AM MRN: [id number] Date of Birth: 1955-12-14 Attending MD: Norvel Richards , MD CSN: [id number] Age: 62 Admit Type: Outpatient Procedure:                Colonoscopy Indications:              Screening for colorectal malignant neoplasm Providers:                Norvel Richards, MD, Janeece Riggers, RN, Aram Candela Referring MD:              Medicines:                Midazolam 6 mg IV, Meperidine 65 mg IV, Ondansetron                            4 mg IV Complications:            No immediate complications. Estimated Blood Loss:     Estimated blood loss was minimal. Procedure:                Pre-Anesthesia Assessment:                           - Prior to the procedure, a History and Physical                            was performed, and patient medications and                            allergies were reviewed. The patient's tolerance of                            previous anesthesia was also reviewed. The risks                            and benefits of the procedure and the sedation                            options and risks were discussed with the patient.                            All questions were answered, and informed consent                            was obtained. Prior Anticoagulants: The patient has                            taken no previous anticoagulant or antiplatelet                            agents. ASA Grade Assessment: II - A patient with  mild systemic disease. After reviewing the risks                            and benefits, the patient was deemed in                            satisfactory condition to undergo the procedure.                           After obtaining informed consent, the colonoscope                            was passed under direct vision. Throughout the                            procedure, the patient's  blood pressure, pulse, and                            oxygen saturations were monitored continuously. The                            CF-HQ190L ([id number]) scope was introduced through                            the anus and advanced to the the cecum, identified                            by appendiceal orifice and ileocecal valve. The                            colonoscopy was performed without difficulty. The                            patient tolerated the procedure well. The quality                            of the bowel preparation was adequate. The                            ileocecal valve, appendiceal orifice, and rectum                            were photographed. The entire colon was well                            visualized. Scope In: 9:05:19 AM Scope Out: 9:34:45 AM Scope Withdrawal Time: 0 hours 23 minutes 25 seconds  Total Procedure Duration: 0 hours 29 minutes 26 seconds  Findings:      The perianal and digital rectal examinations were normal.      Two pedunculated polyps were found in the sigmoid colon. The polyps were       12 to 15 mm in size. These polyps were removed with a hot snare. The       stalk on the largest polyp was clipped  x2 to minimize post polypectomy       complications. Resection and retrieval were complete. Estimated blood       loss: none.      Two sessile polyps were found in the ascending colon. The polyps were 4       to 6 mm in size. These polyps were removed with a cold snare. Resection       and retrieval were complete. Estimated blood loss was minimal.      A 6 mm polyp was found in the descending colon. The polyp was sessile.       The polyp was removed with a cold snare. Resection and retrieval were       complete. Estimated blood loss was minimal.      The exam was otherwise without abnormality on direct and retroflexion       views. Impression:               - Two 12 to 15 mm polyps in the sigmoid colon,                            removed  with a hot snare. Resected and retrieved.                            Clips were placed as described.                           - Two 4 to 6 mm polyps in the ascending colon,                            removed with a cold snare. Resected and retrieved.                           - One 6 mm polyp in the descending colon, removed                            with a cold snare. Resected and retrieved.                           - The examination was otherwise normal on direct                            and retroflexion views. Moderate Sedation:      Moderate (conscious) sedation was administered by the endoscopy nurse       and supervised by the endoscopist. The following parameters were       monitored: oxygen saturation, heart rate, blood pressure, respiratory       rate, EKG, adequacy of pulmonary ventilation, and response to care.       Total physician intraservice time was 34 minutes. Recommendation:           - Patient has a contact number available for                            emergencies. The signs and symptoms of potential  delayed complications were discussed with the                            patient. Return to normal activities tomorrow.                            Written discharge instructions were provided to the                            patient.                           - Resume previous diet.                           - Continue present medications. Avoid aspirin and                            NSAIDs x10 days. No future MRI and 2 clips gone.                           - Repeat colonoscopy date to be determined after                            pending pathology results are reviewed for                            surveillance based on pathology results.                           - Return to GI office (date not yet determined). Procedure Code(s):        --- Professional ---                           [phone], Colonoscopy, flexible; with removal of                             tumor(s), polyp(s), or other lesion(s) by snare                            technique                           99153, Moderate sedation; each additional 15                            minutes intraservice time                           G0500, Moderate sedation services provided by the                            same physician or other qualified health care                            professional performing  a gastrointestinal                            endoscopic service that sedation supports,                            requiring the presence of an independent trained                            observer to assist in the monitoring of the                            patient's level of consciousness and physiological                            status; initial 15 minutes of intra-service time;                            patient age 77 years or older (additional time may                            be reported with [phone], as appropriate) Diagnosis Code(s):        --- Professional ---                           Z12.11, Encounter for screening for malignant                            neoplasm of colon                           D12.5, Benign neoplasm of sigmoid colon                           D12.2, Benign neoplasm of ascending colon                           D12.4, Benign neoplasm of descending colon CPT copyright 2018 American Medical Association. All rights reserved. The codes documented in this report are preliminary and upon coder review may  be revised to meet current compliance requirements. Cristopher Estimable. Rourk, MD Norvel Richards, MD 08/29/2018 9:42:59 AM This report has been signed electronically. Number of Addenda: 0

## 2018-08-29 NOTE — Discharge Instructions (Signed)
Colonoscopy Discharge Instructions  Read the instructions outlined below and refer to this sheet in the next few weeks. These discharge instructions provide you with general information on caring for yourself after you leave the hospital. Your doctor may also give you specific instructions. While your treatment has been planned according to the most current medical practices available, unavoidable complications occasionally occur. If you have any problems or questions after discharge, call Dr. Gala Romney at [phone]. ACTIVITY  You may resume your regular activity, but move at a slower pace for the next 24 hours.   Take frequent rest periods for the next 24 hours.   Walking will help get rid of the air and reduce the bloated feeling in your belly (abdomen).   No driving for 24 hours (because of the medicine (anesthesia) used during the test).    Do not sign any important legal documents or operate any machinery for 24 hours (because of the anesthesia used during the test).  NUTRITION  Drink plenty of fluids.   You may resume your normal diet as instructed by your doctor.   Begin with a light meal and progress to your normal diet. Heavy or fried foods are harder to digest and may make you feel sick to your stomach (nauseated).   Avoid alcoholic beverages for 24 hours or as instructed.  MEDICATIONS  You may resume your normal medications unless your doctor tells you otherwise.  WHAT YOU CAN EXPECT TODAY  Some feelings of bloating in the abdomen.   Passage of more gas than usual.   Spotting of blood in your stool or on the toilet paper.  IF YOU HAD POLYPS REMOVED DURING THE COLONOSCOPY:  No aspirin products for 7 days or as instructed.   No alcohol for 7 days or as instructed.   Eat a soft diet for the next 24 hours.  FINDING OUT THE RESULTS OF YOUR TEST Not all test results are available during your visit. If your test results are not back during the visit, make an appointment  with your caregiver to find out the results. Do not assume everything is normal if you have not heard from your caregiver or the medical facility. It is important for you to follow up on all of your test results.  SEEK IMMEDIATE MEDICAL ATTENTION IF:  You have more than a spotting of blood in your stool.   Your belly is swollen (abdominal distention).   You are nauseated or vomiting.   You have a temperature over 101.   You have abdominal pain or discomfort that is severe or gets worse throughout the day.    Colon polyp information provided  No future MRI until clips go  Stop aspirin and all nonsteroidal agents x10 days  Further recommendations to follow pending review of pathology report   Colon Polyps Polyps are tissue growths inside the body. Polyps can grow in many places, including the large intestine (colon). A polyp may be a round bump or a mushroom-shaped growth. You could have one polyp or several. Most colon polyps are noncancerous (benign). However, some colon polyps can become cancerous over time. What are the causes? The exact cause of colon polyps is not known. What increases the risk? This condition is more likely to develop in people who:  Have a family history of colon cancer or colon polyps.  Are older than 34 or older than 45 if they are African American.  Have inflammatory bowel disease, such as ulcerative colitis or Crohn disease.  Are overweight.  Smoke cigarettes.  Do not get enough exercise.  Drink too much alcohol.  Eat a diet that is: ? High in fat and red meat. ? Low in fiber.  Had childhood cancer that was treated with abdominal radiation.  What are the signs or symptoms? Most polyps do not cause symptoms. If you have symptoms, they may include:  Blood coming from your rectum when having a bowel movement.  Blood in your stool.The stool may look dark red or black.  A change in bowel habits, such as constipation or diarrhea.  How  is this diagnosed? This condition is diagnosed with a colonoscopy. This is a procedure that uses a lighted, flexible scope to look at the inside of your colon. How is this treated? Treatment for this condition involves removing any polyps that are found. Those polyps will then be tested for cancer. If cancer is found, your health care provider will talk to you about options for colon cancer treatment. Follow these instructions at home: Diet  Eat plenty of fiber, such as fruits, vegetables, and whole grains.  Eat foods that are high in calcium and vitamin D, such as milk, cheese, yogurt, eggs, liver, fish, and broccoli.  Limit foods high in fat, red meats, and processed meats, such as hot dogs, sausage, bacon, and lunch meats.  Maintain a healthy weight, or lose weight if recommended by your health care provider. General instructions  Do not smoke cigarettes.  Do not drink alcohol excessively.  Keep all follow-up visits as told by your health care provider. This is important. This includes keeping regularly scheduled colonoscopies. Talk to your health care provider about when you need a colonoscopy.  Exercise every day or as told by your health care provider. Contact a health care provider if:  You have new or worsening bleeding during a bowel movement.  You have new or increased blood in your stool.  You have a change in bowel habits.  You unexpectedly lose weight. This information is not intended to replace advice given to you by your health care provider. Make sure you discuss any questions you have with your health care provider. Document Released: 08/01/2004 Document Revised: 04/12/2016 Document Reviewed: 09/26/2015 Elsevier Interactive Patient Education  [employer].

## 2018-08-29 NOTE — H&P (Signed)
@LOGO @   Primary Care Physician:  Elsie Lincoln, MD Primary Gastroenterologist:  Dr. Gala Romney  Pre-Procedure History & Physical: HPI:  Ian [name] is a 62 y.o. male is here for a screening colonoscopy.  No bowel symptoms.  No prior colonoscopy.  No family history of colon cancer.  Past Medical History:  Diagnosis Date  . 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 Complication of anesthesia    atypical pseudo-cholinesterase deficiency  . Diabetes mellitus without complication ([place])   . Family history of adverse reaction to anesthesia    sister also has atypical pseudo-cholinesterase deficiency  . GERD (gastroesophageal reflux disease)   . History of heart bypass surgery   . Hypertension     Past Surgical History:  Procedure Laterality Date  . CORONARY ARTERY BYPASS GRAFT     4 vessels  . heart bypass    . INSERTION OF MESH  02/07/2015   Procedure: INSERTION OF MESH;  Surgeon: Aviva Signs Md, MD;  Location: [facility];  Service: General;;  . UMBILICAL HERNIA REPAIR N/A 02/07/2015   Procedure: UMBILICAL HERNIORRHAPHY WITH MESH;  Surgeon: Aviva Signs Md, MD;  Location: [facility];  Service: General;  Laterality: N/A;    Prior to Admission medications   Medication Sig Start Date End Date Taking? Authorizing Provider  aspirin EC 81 MG tablet Take 1 tablet (81 mg total) by mouth daily. 01/10/13  Yes Rexene Alberts, MD  Coenzyme Q10 (COQ10) 100 MG CAPS Take 100 mg by mouth daily.   Yes [provider]  diltiazem (CARDIZEM CD) 240 MG 24 hr capsule Take 240 mg by mouth daily.   Yes [provider]  diphenhydramine-acetaminophen (TYLENOL PM) 25-500 MG TABS tablet Take 2 tablets by mouth at bedtime.   Yes [provider]  ezetimibe-simvastatin (VYTORIN) 10-80 MG per tablet Take 1 tablet by mouth at bedtime.   Yes [provider]  hydrochlorothiazide (MICROZIDE) 12.5 MG capsule Take 12.5 mg by mouth daily.   Yes [provider]  insulin glargine  (LANTUS SOLOSTAR) 100 UNIT/ML injection Inject 20 Units into the skin at bedtime.    Yes [provider]  insulin lispro protamine-lispro (HUMALOG 75/25 MIX) (75-25) 100 UNIT/ML SUSP injection Inject 16 Units into the skin 3 (three) times daily.   Yes [provider]  INVOKANA 300 MG TABS tablet Take 300 mg by mouth daily. 02/01/15  Yes [provider]  metFORMIN (GLUCOPHAGE) 1000 MG tablet Take 1,000 mg by mouth 2 (two) times daily.  02/01/15  Yes [provider]  metoprolol succinate (TOPROL-XL) 50 MG 24 hr tablet Take 75 mg by mouth 2 (two) times daily.   Yes [provider]  niacin (NIASPAN) 500 MG CR tablet Take 500 mg by mouth at bedtime.   Yes [provider]  Omega-3 Fatty Acids (FISH OIL PO) Take 1 capsule by mouth daily.   Yes [provider]  pantoprazole (PROTONIX) 40 MG tablet Take 40 mg by mouth daily.   Yes [provider]  polyethylene glycol-electrolytes (TRILYTE) 420 g solution Take 4,000 mLs by mouth as directed. 06/16/18  Yes Carlis Stable, NP  ramipril (ALTACE) 10 MG capsule Take 10 mg by mouth daily.   Yes [provider]  TRADJENTA 5 MG TABS tablet Take 5 mg by mouth daily.  02/01/15  Yes [provider]    Allergies as of 06/16/2018 - Review Complete 06/16/2018  Allergen Reaction Noted  . Anectine [succinylcholine] Other (See Comments) 02/07/2015    Family  History  Problem Relation Age of Onset  . Hypertension Father   . Diabetes Father   . Hypertension Sister   . Diabetes Sister   . Hypertension Brother   . Diabetes Brother     Social History   Socioeconomic History  . Marital status: Married    Spouse name: Not on file  . Number of children: Not on file  . Years of education: Not on file  . Highest education level: Not on file  Occupational History  . 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 Financial resource strain: Not on file   . Food insecurity:    Worry: Not on file    Inability: Not on file  . Transportation needs:    Medical: Not on file    Non-medical: Not on file  Tobacco Use  . Smoking status: Never Smoker  . Smokeless tobacco: Never Used  Substance and Sexual Activity  . Alcohol use: No  . Drug use: No  . Sexual activity: Yes    Birth control/protection: None  Lifestyle  . Physical activity:    Days per week: Not on file    Minutes per session: Not on file  . Stress: Not on file  Relationships  . Social connections:    Talks on phone: Not on file    Gets together: Not on file    Attends religious service: Not on file    Active member of club or organization: Not on file    Attends meetings of clubs or organizations: Not on file    Relationship status: Not on file  . Intimate partner violence:    Fear of current or ex partner: Not on file    Emotionally abused: Not on file    Physically abused: Not on file    Forced sexual activity: Not on file  Other Topics Concern  . Not on file  Social History Narrative  . Not on file    Review of Systems: See HPI, otherwise negative ROS  Physical Exam: BP (!) 199/85   Pulse (!) 109   Temp 98.3 F (36.8 C) (Oral)   Resp 20   Ht 5\' 2"  (1.575 m)   Wt 95.3 kg   SpO2 100%   BMI 38.41 kg/m  General:   Alert,  Well-developed, well-nourished, pleasant and cooperative in NAD Neck:  Supple; no masses or thyromegaly. Lungs:  Clear throughout to auscultation.   No wheezes, crackles, or rhonchi. No acute distress. Heart:  Regular rate and rhythm; no murmurs, clicks, rubs,  or gallops. Abdomen:  Soft, nontender and nondistended. No masses, hepatosplenomegaly or hernias noted. Normal bowel sounds, without guarding, and without rebound.    Impression/Plan: Ian [name] is now here to undergo a screening colonoscopy.  First ever average risk screening colonoscopy.  Risks, benefits, limitations, imponderables and alternatives regarding colonoscopy  have been reviewed with the patient. Questions have been answered. All parties agreeable.     Notice:  This dictation was prepared with Dragon dictation along with smaller phrase technology. Any transcriptional errors that result from this process are unintentional and may not be corrected upon review.

## 2018-09-02 ENCOUNTER — Telehealth: Payer: Self-pay

## 2018-09-02 DIAGNOSIS — C189 Malignant neoplasm of colon, unspecified: Secondary | ICD-10-CM

## 2018-09-02 NOTE — Telephone Encounter (Signed)
Dr.Rourk would like for this pt to see Dr.Jenkins or Dr.Bridges asap for colon cancer diagnosis.  Dr.Rourk has informed the pt and the patient said we could call him in the morning.

## 2018-09-02 NOTE — Addendum Note (Signed)
Addended by: Hassan Rowan on: 09/02/2018 04:37 PM   Modules accepted: Orders

## 2018-09-02 NOTE — Telephone Encounter (Signed)
Urgent referral sent to Dr. Runell Gess. Constance Haw via [facility]. Their office will contact pt for scheduling.

## 2018-09-03 NOTE — Progress Notes (Signed)
CC'D TO PCP °

## 2018-09-04 ENCOUNTER — Encounter (HOSPITAL_COMMUNITY): Payer: Self-pay | Admitting: Internal Medicine

## 2018-09-09 ENCOUNTER — Ambulatory Visit: Payer: 59 | Admitting: General Surgery

## 2018-09-09 ENCOUNTER — Encounter: Payer: Self-pay | Admitting: General Surgery

## 2018-09-09 VITALS — BP 169/74 | HR 84 | Temp 98.6°F | Resp 20 | Wt 207.8 lb

## 2018-09-09 DIAGNOSIS — C187 Malignant neoplasm of sigmoid colon: Secondary | ICD-10-CM | POA: Diagnosis not present

## 2018-09-09 MED ORDER — METRONIDAZOLE 500 MG PO TABS: 500.0000 mg | ORAL_TABLET | Freq: Three times a day (TID) | ORAL | 0 refills | Status: DC

## 2018-09-09 MED ORDER — PEG 3350-KCL-NABCB-NACL-NASULF 236 G PO SOLR: 4000.0000 mL | Freq: Once | ORAL | 0 refills | Status: AC

## 2018-09-09 MED ORDER — NEOMYCIN SULFATE 500 MG PO TABS: 1000.0000 mg | ORAL_TABLET | Freq: Three times a day (TID) | ORAL | 0 refills | Status: DC

## 2018-09-09 NOTE — Patient Instructions (Signed)
Open Colectomy An open colectomy is surgery to remove part or all of the large intestine (colon). This procedure may be used to treat several conditions, including:  Inflammation and infection of the colon (diverticulitis).  Tumors or masses in the colon.  Inflammatory bowel disease, such as Crohn disease or ulcerative colitis.  Bleeding from the colon.  Blockage or obstruction of the colon.  Tell a health care provider about:  Any allergies you have.  All medicines you are taking, including vitamins, herbs, eye drops, creams, and over-the-counter medicines.  Any problems you or family members have had with anesthetic medicines.  Any blood disorders you have.  Any surgeries you have had.  Any medical conditions you have.  Whether you are pregnant or may be pregnant.  Whether you smoke or use tobacco products. These can affect your body's reaction to anesthesia. What are the risks? Generally, this is a safe procedure. However, problems may occur, including:  Infection.  Bleeding.  Allergic reactions to medicines.  Damage to other structures or organs.  Pneumonia.  The incision opening up.  Tissues from inside the abdomen bulging through the incision (hernia).  Reopening of the colon where it was stitched or stapled together.  A blood clot forming in a vein and traveling to the lungs.  Future blockage of the small intestine from scar tissue.  What happens before the procedure? Staying hydrated Follow instructions from your health care provider about hydration, which may include:  Up to 2 hours before the procedure - you may continue to drink clear liquids, such as water, clear fruit juice, black coffee, and plain tea.  Eating and drinking restrictions Follow instructions from your health care provider about eating and drinking, which may include:  8 hours before the procedure - stop eating heavy meals or foods such as meat, fried foods, or fatty foods.  6  hours before the procedure - stop eating light meals or foods, such as toast or cereal.  6 hours before the procedure - stop drinking milk or drinks that contain milk.  2 hours before the procedure - stop drinking clear liquids.  Bowel prep In some cases, you may be prescribed an oral bowel prep to clean out your colon. If so:  Take it as told by your health care provider. Starting the day before your procedure, you may need to drink a large amount of medicated liquid. The liquid will cause you to have multiple loose stools until your stool is almost clear or light green.  Follow instructions from your health care provider about eating and drinking restrictions during bowel prep.  Medicines  Ask your health care provider about: ? Changing or stopping your regular medicines or vitamins. This is especially important if you are taking diabetes medicines, blood thinners, or vitamin E. ? Taking medicines such as aspirin and ibuprofen. These medicines can thin your blood. Do not take these medicines before your procedure if your health care provider instructs you not to.  If you were prescribed an antibiotic medicine, take it as told by your health care provider. General instructions  Bring loose-fitting, comfortable clothing and slip-on shoes that you can put on without bending over.  Make sure to see your health care provider for any tests that you need before the procedure, such as: ? Blood tests. ? A test to check the heart's rhythm (electrocardiogram, ECG). ? A CT scan of your abdomen. ? Urine tests. ? Colonoscopy.  Plan to have someone take you home from the   hospital or clinic.  Arrange for someone to help you with your activities during your recovery. What happens during the procedure?  To reduce your risk of infection: ? Your health care team will wash or sanitize their hands. ? Your skin will be washed with soap. ? Hair may be removed from the surgical area.  An IV tube  will be inserted into one of your veins. The tube will be used to give you medicines and fluids.  You will be given a medicine to make you fall asleep (general anesthetic). You may also be given a medicine to help you relax (sedative).  Small monitors will be connected to your body. They will be used to check your heart, blood pressure, and oxygen level.  A breathing tube may be placed into your lungs during the procedure.  A thin, flexible tube (catheter) will be placed into your bladder to drain urine.  A tube may be inserted through your nose and into your stomach (nasogastric tube, or NG tube). The tube is used to remove stomach fluids after surgery until the intestines start working again.  An incision will be made in your abdomen.  Clamps or staples will be put on your colon.  The part of the colon between the clamps or staples will be removed.  The ends of the colon that remain will be stitched or stapled together.  The incision in your abdomen will be closed with stitches (sutures) or staples.  The incision will be covered with a bandage (dressing).  A small opening (stoma) may be created in your lower abdomen. A removable, external pouch (ostomy pouch) will be attached to the stoma. This pouch will collect stool outside of your body. Stool passes through the stoma and into the pouch instead of through your anus. The procedure may vary among health care providers and hospitals. What happens after the procedure?  Your blood pressure, heart rate, breathing rate, and blood oxygen level will be monitored until the medicines you were given have worn off.  You may continue to receive fluids and medicines through an IV tube.  You will start on a clear liquid diet and gradually go back to a normal diet.  Do not drive until your health care provider approves.  You may have some pain in your abdomen. You will be given pain medicine to control the pain.  You will be encouraged to  do the following: ? Do breathing exercises to prevent pneumonia. ? Get up and start walking within a day after surgery. You should try to get up 5-6 times a day. This information is not intended to replace advice given to you by your health care provider. Make sure you discuss any questions you have with your health care provider. Document Released: 09/02/2009 Document Revised: 08/06/2016 Document Reviewed: 08/06/2016 Elsevier Interactive Patient Education  2018 [employer]  

## 2018-09-09 NOTE — Progress Notes (Signed)
Ian [name]; [id number]; 12-Jun-1956   HPI Patient is a 62 year old white male who was referred to my care by Dr. Gala Romney for evaluation treatment of colon cancer.  Patient recently underwent a colonoscopy and a polyp in the sigmoid colon region showed invasive adenocarcinoma in the stalk.  It was at the margin of the polypectomy.  Other polyps in the colon were negative for malignancy.  Patient denies any abdominal pain.  This was found on routine screening for colonoscopy.  Patient denies any family history of colon cancer.  He denies any blood per rectum. Past Medical History:  Diagnosis Date  . 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 Complication of anesthesia    atypical pseudo-cholinesterase deficiency  . Diabetes mellitus without complication ([place])   . Family history of adverse reaction to anesthesia    sister also has atypical pseudo-cholinesterase deficiency  . GERD (gastroesophageal reflux disease)   . History of heart bypass surgery   . Hypertension     Past Surgical History:  Procedure Laterality Date  . COLONOSCOPY N/A 08/29/2018   Procedure: COLONOSCOPY;  Surgeon: Daneil Dolin, MD;  [facility]: [facility];  Service: Endoscopy;  Laterality: N/A;  9:30  . CORONARY ARTERY BYPASS GRAFT     4 vessels  . heart bypass    . INSERTION OF MESH  02/07/2015   Procedure: INSERTION OF MESH;  Surgeon: Aviva Signs Md, MD;  Location: [facility];  Service: General;;  . POLYPECTOMY  08/29/2018   Procedure: POLYPECTOMY;  Surgeon: Daneil Dolin, MD;  [facility];  Service: Endoscopy;;  . UMBILICAL HERNIA REPAIR N/A 02/07/2015   Procedure: UMBILICAL HERNIORRHAPHY WITH MESH;  Surgeon: Aviva Signs Md, MD;  Location: [facility];  Service: General;  Laterality: N/A;    Family History  Problem Relation Age of Onset  . Hypertension Father   . Diabetes Father   . Hypertension Sister   . Diabetes Sister   . Hypertension Brother   . Diabetes Brother     Current Outpatient  Medications on File Prior to Visit  Medication Sig Dispense Refill  . Coenzyme Q10 (COQ10) 100 MG CAPS Take 100 mg by mouth daily.    Marland Kitchen diltiazem (CARDIZEM CD) 240 MG 24 hr capsule Take 240 mg by mouth daily.    . diphenhydramine-acetaminophen (TYLENOL PM) 25-500 MG TABS tablet Take 2 tablets by mouth at bedtime.    Marland Kitchen ezetimibe-simvastatin (VYTORIN) 10-80 MG per tablet Take 1 tablet by mouth at bedtime.    . hydrochlorothiazide (MICROZIDE) 12.5 MG capsule Take 12.5 mg by mouth daily.    . insulin glargine (LANTUS SOLOSTAR) 100 UNIT/ML injection Inject 20 Units into the skin at bedtime.     . insulin lispro protamine-lispro (HUMALOG 75/25 MIX) (75-25) 100 UNIT/ML SUSP injection Inject 16 Units into the skin 3 (three) times daily.    . INVOKANA 300 MG TABS tablet Take 300 mg by mouth daily.    . metFORMIN (GLUCOPHAGE) 1000 MG tablet Take 1,000 mg by mouth 2 (two) times daily.     . metoprolol succinate (TOPROL-XL) 50 MG 24 hr tablet Take 75 mg by mouth 2 (two) times daily.    . niacin (NIASPAN) 500 MG CR tablet Take 500 mg by mouth at bedtime.    . Omega-3 Fatty Acids (FISH OIL PO) Take 1 capsule by mouth daily.    . pantoprazole (PROTONIX) 40 MG tablet Take 40 mg by mouth daily.    . ramipril (ALTACE) 10 MG capsule Take  10 mg by mouth daily.    . TRADJENTA 5 MG TABS tablet Take 5 mg by mouth daily.      No current facility-administered medications on file prior to visit.     Allergies  Allergen Reactions  . Anectine [Succinylcholine] Other (See Comments)    Atypical pseudocholinesterase deficiency.     Social History   Substance and Sexual Activity  Alcohol Use No    Social History   Tobacco Use  Smoking Status Never Smoker  Smokeless Tobacco Never Used    Review of Systems  Constitutional: Negative.   HENT: Negative.   Eyes: Negative.   Respiratory: Negative.   Cardiovascular: Negative.   Gastrointestinal: Negative.   Genitourinary: Negative.   Musculoskeletal:  Negative.   Skin: Negative.   Neurological: Negative.   Endo/Heme/Allergies: Negative.   Psychiatric/Behavioral: Negative.     Objective   Vitals:   09/09/18 1406  BP: (!) 169/74  Pulse: 84  Resp: 20  Temp: 98.6 F (37 C)    Physical Exam  Constitutional: He is oriented to person, place, and time. He appears well-developed and well-nourished. No distress.  HENT:  Head: Normocephalic and atraumatic.  Cardiovascular: Normal rate, regular rhythm and normal heart sounds. Exam reveals no gallop and no friction rub.  No murmur heard. Pulmonary/Chest: Effort normal and breath sounds normal. No stridor. No respiratory distress. He has no wheezes. He has no rales.  Abdominal: Soft. Bowel sounds are normal. He exhibits no distension and no mass. There is no tenderness. There is no guarding. No hernia.  Neurological: He is alert and oriented to person, place, and time.  Skin: Skin is warm and dry.  Vitals reviewed.  Dr. Roseanne Kaufman notes and pathology reports reviewed.  Assessment  Sigmoid colon carcinoma Plan   Patient is scheduled for a partial colectomy on 09/17/2018.  The risks and benefits of the procedure including bleeding, infection, anastomotic leak, and the possibility of a blood transfusion were fully explained to the patient, who gave informed consent.  GoLYTELY, neomycin, and Flagyl have all been written for bowel preparation.

## 2018-09-09 NOTE — H&P (Signed)
Ian [name]; [id number]; 1956/07/25   HPI Patient is a 62 year old white male who was referred to my care by Dr. Gala Romney for evaluation treatment of colon cancer.  Patient recently underwent a colonoscopy and a polyp in the sigmoid colon region showed invasive adenocarcinoma in the stalk.  It was at the margin of the polypectomy.  Other polyps in the colon were negative for malignancy.  Patient denies any abdominal pain.  This was found on routine screening for colonoscopy.  Patient denies any family history of colon cancer.  He denies any blood per rectum. Past Medical History:  Diagnosis Date  . 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 Complication of anesthesia    atypical pseudo-cholinesterase deficiency  . Diabetes mellitus without complication ([place])   . Family history of adverse reaction to anesthesia    sister also has atypical pseudo-cholinesterase deficiency  . GERD (gastroesophageal reflux disease)   . History of heart bypass surgery   . Hypertension     Past Surgical History:  Procedure Laterality Date  . COLONOSCOPY N/A 08/29/2018   Procedure: COLONOSCOPY;  Surgeon: Daneil Dolin, MD;  [facility];  Service: Endoscopy;  Laterality: N/A;  9:30  . CORONARY ARTERY BYPASS GRAFT     4 vessels  . heart bypass    . INSERTION OF MESH  02/07/2015   Procedure: INSERTION OF MESH;  Surgeon: Aviva Signs Md, MD;  Location: [facility];  Service: General;;  . POLYPECTOMY  08/29/2018   Procedure: POLYPECTOMY;  Surgeon: Daneil Dolin, MD;  [facility];  Service: Endoscopy;;  . UMBILICAL HERNIA REPAIR N/A 02/07/2015   Procedure: UMBILICAL HERNIORRHAPHY WITH MESH;  Surgeon: Aviva Signs Md, MD;  Location: [facility];  Service: General;  Laterality: N/A;    Family History  Problem Relation Age of Onset  . Hypertension Father   . Diabetes Father   . Hypertension Sister   . Diabetes Sister   . Hypertension Brother   . Diabetes Brother     Current Outpatient  Medications on File Prior to Visit  Medication Sig Dispense Refill  . Coenzyme Q10 (COQ10) 100 MG CAPS Take 100 mg by mouth daily.    Marland Kitchen diltiazem (CARDIZEM CD) 240 MG 24 hr capsule Take 240 mg by mouth daily.    . diphenhydramine-acetaminophen (TYLENOL PM) 25-500 MG TABS tablet Take 2 tablets by mouth at bedtime.    Marland Kitchen ezetimibe-simvastatin (VYTORIN) 10-80 MG per tablet Take 1 tablet by mouth at bedtime.    . hydrochlorothiazide (MICROZIDE) 12.5 MG capsule Take 12.5 mg by mouth daily.    . insulin glargine (LANTUS SOLOSTAR) 100 UNIT/ML injection Inject 20 Units into the skin at bedtime.     . insulin lispro protamine-lispro (HUMALOG 75/25 MIX) (75-25) 100 UNIT/ML SUSP injection Inject 16 Units into the skin 3 (three) times daily.    . INVOKANA 300 MG TABS tablet Take 300 mg by mouth daily.    . metFORMIN (GLUCOPHAGE) 1000 MG tablet Take 1,000 mg by mouth 2 (two) times daily.     . metoprolol succinate (TOPROL-XL) 50 MG 24 hr tablet Take 75 mg by mouth 2 (two) times daily.    . niacin (NIASPAN) 500 MG CR tablet Take 500 mg by mouth at bedtime.    . Omega-3 Fatty Acids (FISH OIL PO) Take 1 capsule by mouth daily.    . pantoprazole (PROTONIX) 40 MG tablet Take 40 mg by mouth daily.    . ramipril (ALTACE) 10 MG capsule Take  10 mg by mouth daily.    . TRADJENTA 5 MG TABS tablet Take 5 mg by mouth daily.      No current facility-administered medications on file prior to visit.     Allergies  Allergen Reactions  . Anectine [Succinylcholine] Other (See Comments)    Atypical pseudocholinesterase deficiency.     Social History   Substance and Sexual Activity  Alcohol Use No    Social History   Tobacco Use  Smoking Status Never Smoker  Smokeless Tobacco Never Used    Review of Systems  Constitutional: Negative.   HENT: Negative.   Eyes: Negative.   Respiratory: Negative.   Cardiovascular: Negative.   Gastrointestinal: Negative.   Genitourinary: Negative.   Musculoskeletal:  Negative.   Skin: Negative.   Neurological: Negative.   Endo/Heme/Allergies: Negative.   Psychiatric/Behavioral: Negative.     Objective   Vitals:   09/09/18 1406  BP: (!) 169/74  Pulse: 84  Resp: 20  Temp: 98.6 F (37 C)    Physical Exam  Constitutional: He is oriented to person, place, and time. He appears well-developed and well-nourished. No distress.  HENT:  Head: Normocephalic and atraumatic.  Cardiovascular: Normal rate, regular rhythm and normal heart sounds. Exam reveals no gallop and no friction rub.  No murmur heard. Pulmonary/Chest: Effort normal and breath sounds normal. No stridor. No respiratory distress. He has no wheezes. He has no rales.  Abdominal: Soft. Bowel sounds are normal. He exhibits no distension and no mass. There is no tenderness. There is no guarding. No hernia.  Neurological: He is alert and oriented to person, place, and time.  Skin: Skin is warm and dry.  Vitals reviewed.  Dr. Roseanne Kaufman notes and pathology reports reviewed.  Assessment  Sigmoid colon carcinoma Plan   Patient is scheduled for a partial colectomy on 09/17/2018.  The risks and benefits of the procedure including bleeding, infection, anastomotic leak, and the possibility of a blood transfusion were fully explained to the patient, who gave informed conse

## 2018-09-10 NOTE — Patient Instructions (Signed)
Ian [name]  09/10/2018     @PREFPERIOPPHARMACY @   Your procedure is scheduled on  09/17/2018 .  Report to [facility] at  900   A.M.  Call this number if you have problems the morning of surgery:  [phone]   Remember:  Follow the diet and prep instructions given to you by Dr Arnoldo Morale office.                      Take these medicines the morning of surgery with A SIP OF WATER  Cardiazem, metoprolol, protonix, ramipril. Take 1/2 of your night time Lantus dosage ( 10 units).    Do not wear jewelry, make-up or nail polish.  Do not wear lotions, powders, or perfumes, or deodorant.  Do not shave 48 hours prior to surgery.  Men may shave face and neck.  Do not bring valuables to the hospital.  [facility] is not responsible for any belongings or valuables.  Contacts, dentures or bridgework may not be worn into surgery.  Leave your suitcase in the car.  After surgery it may be brought to your room.  For patients admitted to the hospital, discharge time will be determined by your treatment team.  Patients discharged the day of surgery will not be allowed to drive home.   Name and phone number of your driver:   family Special instructions:  None  Please read over the following fact sheets that you were given. Pain Booklet, Coughing and Deep Breathing, Blood Transfusion Information, Lab Information, MRSA Information, Surgical Site Infection Prevention, Anesthesia Post-op Instructions and Care and Recovery After Surgery       Open Colectomy An open colectomy is surgery to remove part or all of the large intestine (colon). This procedure may be used to treat several conditions, including:  Inflammation and infection of the colon (diverticulitis).  Tumors or masses in the colon.  Inflammatory bowel disease, such as Crohn disease or ulcerative colitis.  Bleeding from the colon.  Blockage or obstruction of the colon.  Tell a health care provider  about:  Any allergies you have.  All medicines you are taking, including vitamins, herbs, eye drops, creams, and over-the-counter medicines.  Any problems you or family members have had with anesthetic medicines.  Any blood disorders you have.  Any surgeries you have had.  Any medical conditions you have.  Whether you are pregnant or may be pregnant.  Whether you smoke or use tobacco products. These can affect your body's reaction to anesthesia. What are the risks? Generally, this is a safe procedure. However, problems may occur, including:  Infection.  Bleeding.  Allergic reactions to medicines.  Damage to other structures or organs.  Pneumonia.  The incision opening up.  Tissues from inside the abdomen bulging through the incision (hernia).  Reopening of the colon where it was stitched or stapled together.  A blood clot forming in a vein and traveling to the lungs.  Future blockage of the small intestine from scar tissue.  What happens before the procedure? Staying hydrated Follow instructions from your health care provider about hydration, which may include:  Up to 2 hours before the procedure - you may continue to drink clear liquids, such as water, clear fruit juice, black coffee, and plain tea.  Eating and drinking restrictions Follow instructions from your health care provider about eating and drinking, which may include:  8 hours before the procedure -  stop eating heavy meals or foods such as meat, fried foods, or fatty foods.  6 hours before the procedure - stop eating light meals or foods, such as toast or cereal.  6 hours before the procedure - stop drinking milk or drinks that contain milk.  2 hours before the procedure - stop drinking clear liquids.  Bowel prep In some cases, you may be prescribed an oral bowel prep to clean out your colon. If so:  Take it as told by your health care provider. Starting the day before your procedure, you may  need to drink a large amount of medicated liquid. The liquid will cause you to have multiple loose stools until your stool is almost clear or light green.  Follow instructions from your health care provider about eating and drinking restrictions during bowel prep.  Medicines  Ask your health care provider about: ? Changing or stopping your regular medicines or vitamins. This is especially important if you are taking diabetes medicines, blood thinners, or vitamin E. ? Taking medicines such as aspirin and ibuprofen. These medicines can thin your blood. Do not take these medicines before your procedure if your health care provider instructs you not to.  If you were prescribed an antibiotic medicine, take it as told by your health care provider. General instructions  Bring loose-fitting, comfortable clothing and slip-on shoes that you can put on without bending over.  Make sure to see your health care provider for any tests that you need before the procedure, such as: ? Blood tests. ? A test to check the heart's rhythm (electrocardiogram, ECG). ? A CT scan of your abdomen. ? Urine tests. ? Colonoscopy.  Plan to have someone take you home from the hospital or clinic.  Arrange for someone to help you with your activities during your recovery. What happens during the procedure?  To reduce your risk of infection: ? Your health care team will wash or sanitize their hands. ? Your skin will be washed with soap. ? Hair may be removed from the surgical area.  An IV tube will be inserted into one of your veins. The tube will be used to give you medicines and fluids.  You will be given a medicine to make you fall asleep (general anesthetic). You may also be given a medicine to help you relax (sedative).  Small monitors will be connected to your body. They will be used to check your heart, blood pressure, and oxygen level.  A breathing tube may be placed into your lungs during the  procedure.  A thin, flexible tube (catheter) will be placed into your bladder to drain urine.  A tube may be inserted through your nose and into your stomach (nasogastric tube, or NG tube). The tube is used to remove stomach fluids after surgery until the intestines start working again.  An incision will be made in your abdomen.  Clamps or staples will be put on your colon.  The part of the colon between the clamps or staples will be removed.  The ends of the colon that remain will be stitched or stapled together.  The incision in your abdomen will be closed with stitches (sutures) or staples.  The incision will be covered with a bandage (dressing).  A small opening (stoma) may be created in your lower abdomen. A removable, external pouch (ostomy pouch) will be attached to the stoma. This pouch will collect stool outside of your body. Stool passes through the stoma and into the pouch instead  of through your anus. The procedure may vary among health care providers and hospitals. What happens after the procedure?  Your blood pressure, heart rate, breathing rate, and blood oxygen level will be monitored until the medicines you were given have worn off.  You may continue to receive fluids and medicines through an IV tube.  You will start on a clear liquid diet and gradually go back to a normal diet.  Do not drive until your health care provider approves.  You may have some pain in your abdomen. You will be given pain medicine to control the pain.  You will be encouraged to do the following: ? Do breathing exercises to prevent pneumonia. ? Get up and start walking within a day after surgery. You should try to get up 5-6 times a day. This information is not intended to replace advice given to you by your health care provider. Make sure you discuss any questions you have with your health care provider. Document Released: 09/02/2009 Document Revised: 08/06/2016 Document Reviewed:  08/06/2016 Elsevier Interactive Patient Education  2018 [employer].  Open Colectomy, Care After This sheet gives you information about how to care for yourself after your procedure. Your health care provider may also give you more specific instructions. If you have problems or questions, contact your health care provider. What can I expect after the procedure? After the procedure, it is common to have:  Pain in your abdomen, especially along your incision.  Tiredness. Your energy level will return to normal over the next several weeks.  Constipation.  Nausea.  Difficulty urinating.  Follow these instructions at home: Activity  You may be able to return to most of your normal activities within 1-2 weeks, such as working, walking up stairs, and sexual activity.  Avoid activities that require a lot of energy for 4-6 weeks after surgery, such as running, climbing, and lifting heavy objects. Ask your health care provider what activities are safe for you.  Take rest breaks during the day as needed.  Do not drive for 1-2 weeks or until your health care provider says that it is safe.  Do not drive or use heavy machinery while taking prescription pain medicines.  Do not lift anything that is heavier than 10 lb (4.3 kg) until your health care provider says that it is safe. Incision care  Follow instructions from your health care provider about how to take care of your incision. Make sure you: ? Wash your hands with soap and water before you change your bandage (dressing). If soap and water are not available, use hand sanitizer. ? Change your dressing as told by your health care provider. ? Leave stitches (sutures) or staples in place. These skin closures may need to stay in place for 2 weeks or longer.  Avoid wearing tight clothing around your incision.  Protect your incision area from the sun.  Check your incision area every day for signs of infection. Check for: ? More redness,  swelling, or pain. ? More fluid or blood. ? Warmth. ? Pus or a bad smell. General instructions  Do not take baths, swim, or use a hot tub until your health care provider approves. Ask your health care provider when you may shower.  Take over-the-counter and prescription medicines, including stool softeners, only as told by your health care provider.  Eat a low-fat and low-fiber diet for the first 4 weeks after surgery.  Keep all follow-up visits as told by your health care provider. This is  important. Contact a health care provider if:  You have more redness, swelling, or pain around your incision.  You have more fluid or blood coming from your incision.  Your incision feels warm to the touch.  You have pus or a bad smell coming from your incision.  You have a fever or chills.  You do not have a bowel movement 2-3 days after surgery.  You cannot eat or drink for 24 hours or more.  You have persistent nausea and vomiting.  You have abdominal pain that gets worse and does not get better with medicine. Get help right away if:  You have chest pain.  You have shortness of breath.  You have pain or swelling in your legs.  Your incision breaks open after your sutures or staples have been removed.  You have bleeding from the rectum. This information is not intended to replace advice given to you by your health care provider. Make sure you discuss any questions you have with your health care provider. Document Released: 05/29/2011 Document Revised: 08/06/2016 Document Reviewed: 08/06/2016 Elsevier Interactive Patient Education  2018 [place] Anesthesia, Adult General anesthesia is the use of medicines to make a person "go to sleep" (be unconscious) for a medical procedure. General anesthesia is often recommended when a procedure:  Is long.  Requires you to be still or in an unusual position.  Is major and can cause you to lose blood.  Is impossible to do  without general anesthesia.  The medicines used for general anesthesia are called general anesthetics. In addition to making you sleep, the medicines:  Prevent pain.  Control your blood pressure.  Relax your muscles.  Tell a health care provider about:  Any allergies you have.  All medicines you are taking, including vitamins, herbs, eye drops, creams, and over-the-counter medicines.  Any problems you or family members have had with anesthetic medicines.  Types of anesthetics you have had in the past.  Any bleeding disorders you have.  Any surgeries you have had.  Any medical conditions you have.  Any history of heart or lung conditions, such as heart failure, sleep apnea, or chronic obstructive pulmonary disease (COPD).  Whether you are pregnant or may be pregnant.  Whether you use tobacco, alcohol, marijuana, or street drugs.  Any history of Armed forces logistics/support/administrative officer.  Any history of depression or anxiety. What are the risks? Generally, this is a safe procedure. However, problems may occur, including:  Allergic reaction to anesthetics.  Lung and heart problems.  Inhaling food or liquids from your stomach into your lungs (aspiration).  Injury to nerves.  Waking up during your procedure and being unable to move (rare).  Extreme agitation or a state of mental confusion (delirium) when you wake up from the anesthetic.  Air in the bloodstream, which can lead to stroke.  These problems are more likely to develop if you are having a major surgery or if you have an advanced medical condition. You can prevent some of these complications by answering all of your health care provider's questions thoroughly and by following all pre-procedure instructions. General anesthesia can cause side effects, including:  Nausea or vomiting  A sore throat from the breathing tube.  Feeling cold or shivery.  Feeling tired, washed out, or achy.  Sleepiness or drowsiness.  Confusion or  agitation.  What happens before the procedure? Staying hydrated Follow instructions from your health care provider about hydration, which may include:  Up to 2 hours before the  procedure - you may continue to drink clear liquids, such as water, clear fruit juice, black coffee, and plain tea.  Eating and drinking restrictions Follow instructions from your health care provider about eating and drinking, which may include:  8 hours before the procedure - stop eating heavy meals or foods such as meat, fried foods, or fatty foods.  6 hours before the procedure - stop eating light meals or foods, such as toast or cereal.  6 hours before the procedure - stop drinking milk or drinks that contain milk.  2 hours before the procedure - stop drinking clear liquids.  Medicines  Ask your health care provider about: ? Changing or stopping your regular medicines. This is especially important if you are taking diabetes medicines or blood thinners. ? Taking medicines such as aspirin and ibuprofen. These medicines can thin your blood. Do not take these medicines before your procedure if your health care provider instructs you not to. ? Taking new dietary supplements or medicines. Do not take these during the week before your procedure unless your health care provider approves them.  If you are told to take a medicine or to continue taking a medicine on the day of the procedure, take the medicine with sips of water. General instructions   Ask if you will be going home the same day, the following day, or after a longer hospital stay. ? Plan to have someone take you home. ? Plan to have someone stay with you for the first 24 hours after you leave the hospital or clinic.  For 3-6 weeks before the procedure, try not to use any tobacco products, such as cigarettes, chewing tobacco, and e-cigarettes.  You may brush your teeth on the morning of the procedure, but make sure to spit out the toothpaste. What  happens during the procedure?  You will be given anesthetics through a mask and through an IV tube in one of your veins.  You may receive medicine to help you relax (sedative).  As soon as you are asleep, a breathing tube may be used to help you breathe.  An anesthesia specialist will stay with you throughout the procedure. He or she will help keep you comfortable and safe by continuing to give you medicines and adjusting the amount of medicine that you get. He or she will also watch your blood pressure, pulse, and oxygen levels to make sure that the anesthetics do not cause any problems.  If a breathing tube was used to help you breathe, it will be removed before you wake up. The procedure may vary among health care providers and hospitals. What happens after the procedure?  You will wake up, often slowly, after the procedure is complete, usually in a recovery area.  Your blood pressure, heart rate, breathing rate, and blood oxygen level will be monitored until the medicines you were given have worn off.  You may be given medicine to help you calm down if you feel anxious or agitated.  If you will be going home the same day, your health care provider may check to make sure you can stand, drink, and urinate.  Your health care providers will treat your pain and side effects before you go home.  Do not drive for 24 hours if you received a sedative.  You may: ? Feel nauseous and vomit. ? Have a sore throat. ? Have mental slowness. ? Feel cold or shivery. ? Feel sleepy. ? Feel tired. ? Feel sore or achy, even in  parts of your body where you did not have surgery. This information is not intended to replace advice given to you by your health care provider. Make sure you discuss any questions you have with your health care provider. Document Released: 02/12/2008 Document Revised: 04/17/2016 Document Reviewed: 10/20/2015 Elsevier Interactive Patient Education  2018 [place]  Anesthesia, Adult, Care After These instructions provide you with information about caring for yourself after your procedure. Your health care provider may also give you more specific instructions. Your treatment has been planned according to current medical practices, but problems sometimes occur. Call your health care provider if you have any problems or questions after your procedure. What can I expect after the procedure? After the procedure, it is common to have:  Vomiting.  A sore throat.  Mental slowness.  It is common to feel:  Nauseous.  Cold or shivery.  Sleepy.  Tired.  Sore or achy, even in parts of your body where you did not have surgery.  Follow these instructions at home: For at least 24 hours after the procedure:  Do not: ? Participate in activities where you could fall or become injured. ? Drive. ? Use heavy machinery. ? Drink alcohol. ? Take sleeping pills or medicines that cause drowsiness. ? Make important decisions or sign legal documents. ? Take care of children on your own.  Rest. Eating and drinking  If you vomit, drink water, juice, or soup when you can drink without vomiting.  Drink enough fluid to keep your urine clear or pale yellow.  Make sure you have little or no nausea before eating solid foods.  Follow the diet recommended by your health care provider. General instructions  Have a responsible adult stay with you until you are awake and alert.  Return to your normal activities as told by your health care provider. Ask your health care provider what activities are safe for you.  Take over-the-counter and prescription medicines only as told by your health care provider.  If you smoke, do not smoke without supervision.  Keep all follow-up visits as told by your health care provider. This is important. Contact a health care provider if:  You continue to have nausea or vomiting at home, and medicines are not helpful.  You cannot  drink fluids or start eating again.  You cannot urinate after 8-12 hours.  You develop a skin rash.  You have fever.  You have increasing redness at the site of your procedure. Get help right away if:  You have difficulty breathing.  You have chest pain.  You have unexpected bleeding.  You feel that you are having a life-threatening or urgent problem. This information is not intended to replace advice given to you by your health care provider. Make sure you discuss any questions you have with your health care provider. Document Released: 02/11/2001 Document Revised: 04/09/2016 Document Reviewed: 10/20/2015 Elsevier Interactive Patient Education  [employer].

## 2018-09-12 ENCOUNTER — Encounter (HOSPITAL_COMMUNITY)
Admission: RE | Admit: 2018-09-12 | Discharge: 2018-09-12 | Disposition: A | Discharge status: Home / Self Care | Payer: 59 | Source: Ambulatory Visit | Attending: General Surgery | Admitting: General Surgery

## 2018-09-12 ENCOUNTER — Encounter (HOSPITAL_COMMUNITY): Payer: Self-pay

## 2018-09-12 ENCOUNTER — Other Ambulatory Visit: Payer: Self-pay

## 2018-09-12 ENCOUNTER — Ambulatory Visit (HOSPITAL_COMMUNITY): Payer: Self-pay

## 2018-09-12 ENCOUNTER — Ambulatory Visit (HOSPITAL_COMMUNITY)
Admission: RE | Admit: 2018-09-12 | Discharge: 2018-09-12 | Disposition: A | Discharge status: Home / Self Care | Payer: 59 | Source: Ambulatory Visit | Attending: General Surgery | Admitting: General Surgery

## 2018-09-12 DIAGNOSIS — Z01818 Encounter for other preprocedural examination: Secondary | ICD-10-CM | POA: Insufficient documentation

## 2018-09-12 DIAGNOSIS — I700 Atherosclerosis of aorta: Secondary | ICD-10-CM | POA: Insufficient documentation

## 2018-09-12 DIAGNOSIS — C189 Malignant neoplasm of colon, unspecified: Secondary | ICD-10-CM | POA: Diagnosis not present

## 2018-09-12 LAB — CBC WITH DIFFERENTIAL/PLATELET
ABS IMMATURE GRANULOCYTES: 0.05 10*3/uL (ref 0.00–0.07)
BASOS ABS: 0.1 10*3/uL (ref 0.0–0.1)
BASOS PCT: 1 %
Eosinophils Absolute: 0.1 10*3/uL (ref 0.0–0.5)
Eosinophils Relative: 1 %
HEMATOCRIT: 43.6 % (ref 39.0–52.0)
Hemoglobin: 14 g/dL (ref 13.0–17.0)
IMMATURE GRANULOCYTES: 0 %
Lymphocytes Relative: 30 %
Lymphs Abs: 3.5 10*3/uL (ref 0.7–4.0)
MCH: 28.4 pg (ref 26.0–34.0)
MCHC: 32.1 g/dL (ref 30.0–36.0)
MCV: 88.4 fL (ref 80.0–100.0)
MONOS PCT: 10 %
Monocytes Absolute: 1.1 10*3/uL — ABNORMAL HIGH (ref 0.1–1.0)
NEUTROS ABS: 6.6 10*3/uL (ref 1.7–7.7)
NEUTROS PCT: 58 %
NRBC: 0 % (ref 0.0–0.2)
Platelets: 303 10*3/uL (ref 150–400)
RBC: 4.93 MIL/uL (ref 4.22–5.81)
RDW: 13.1 % (ref 11.5–15.5)
WBC: 11.4 10*3/uL — ABNORMAL HIGH (ref 4.0–10.5)

## 2018-09-12 LAB — COMPREHENSIVE METABOLIC PANEL
ALT: 22 U/L (ref 0–44)
AST: 21 U/L (ref 15–41)
Albumin: 4.6 g/dL (ref 3.5–5.0)
Alkaline Phosphatase: 73 U/L (ref 38–126)
Anion gap: 12 (ref 5–15)
BILIRUBIN TOTAL: 0.7 mg/dL (ref 0.3–1.2)
BUN: 16 mg/dL (ref 8–23)
CO2: 25 mmol/L (ref 22–32)
CREATININE: 1.08 mg/dL (ref 0.61–1.24)
Calcium: 9.5 mg/dL (ref 8.9–10.3)
Chloride: 102 mmol/L (ref 98–111)
GFR calc Af Amer: >60 mL/min (ref >60)
GFR calc non Af Amer: >60 mL/min (ref >60)
GLUCOSE: 187 mg/dL — AB (ref 70–99)
Potassium: 3.6 mmol/L (ref 3.5–5.1)
Sodium: 139 mmol/L (ref 135–145)
Total Protein: 8.3 g/dL — ABNORMAL HIGH (ref 6.5–8.1)

## 2018-09-12 LAB — TYPE AND SCREEN
ABO/RH(D): O NEG
Antibody Screen: NEGATIVE

## 2018-09-12 LAB — HEMOGLOBIN A1C
HEMOGLOBIN A1C: 9.5 % — AB (ref 4.8–5.6)
MEAN PLASMA GLUCOSE: 225.95 mg/dL

## 2018-09-12 LAB — GLUCOSE, CAPILLARY: GLUCOSE-CAPILLARY: 170 mg/dL — AB (ref 70–99)

## 2018-09-12 NOTE — Progress Notes (Signed)
   09/12/18 1402  OBSTRUCTIVE SLEEP APNEA  Have you ever been diagnosed with sleep apnea through a sleep study? No  Do you snore loudly (loud enough to be heard through closed doors)?  0  Do you often feel tired, fatigued, or sleepy during the daytime (such as falling asleep during driving or talking to someone)? 0  Has anyone observed you stop breathing during your sleep? 0  Do you have, or are you being treated for high blood pressure? 1  BMI more than 35 kg/m2? 1  Age > 50 (1-yes) 1  Neck circumference greater than:Male 16 inches or larger, Male 17inches or larger? 0  Male Gender (Yes=1) 1  Obstructive Sleep Apnea Score 4  Score 5 or greater  Results sent to PCP

## 2018-09-13 LAB — CEA: CEA: 46.5 ng/mL — ABNORMAL HIGH (ref 0.0–4.7)

## 2018-09-17 ENCOUNTER — Other Ambulatory Visit: Payer: Self-pay

## 2018-09-17 ENCOUNTER — Inpatient Hospital Stay (HOSPITAL_COMMUNITY): Payer: 59 | Admitting: Registered Nurse

## 2018-09-17 ENCOUNTER — Encounter (HOSPITAL_COMMUNITY): Payer: Self-pay | Admitting: *Deleted

## 2018-09-17 ENCOUNTER — Encounter (HOSPITAL_COMMUNITY)
Admission: RE | Disposition: A | Discharge status: Home / Self Care | Payer: Self-pay | Source: Home / Self Care | Attending: General Surgery

## 2018-09-17 ENCOUNTER — Inpatient Hospital Stay (HOSPITAL_COMMUNITY)
Admission: RE | Admit: 2018-09-17 | Discharge: 2018-09-20 | DRG: 330 | Disposition: A | Discharge status: Home / Self Care | Payer: 59 | Attending: General Surgery | Admitting: General Surgery

## 2018-09-17 DIAGNOSIS — C787 Secondary malignant neoplasm of liver and intrahepatic bile duct: Secondary | ICD-10-CM | POA: Diagnosis present

## 2018-09-17 DIAGNOSIS — Z833 Family history of diabetes mellitus: Secondary | ICD-10-CM | POA: Diagnosis not present

## 2018-09-17 DIAGNOSIS — E109 Type 1 diabetes mellitus without complications: Secondary | ICD-10-CM | POA: Diagnosis present

## 2018-09-17 DIAGNOSIS — I10 Essential (primary) hypertension: Secondary | ICD-10-CM | POA: Diagnosis present

## 2018-09-17 DIAGNOSIS — Z794 Long term (current) use of insulin: Secondary | ICD-10-CM | POA: Diagnosis not present

## 2018-09-17 DIAGNOSIS — C187 Malignant neoplasm of sigmoid colon: Principal | ICD-10-CM

## 2018-09-17 DIAGNOSIS — R160 Hepatomegaly, not elsewhere classified: Secondary | ICD-10-CM | POA: Diagnosis not present

## 2018-09-17 DIAGNOSIS — K219 Gastro-esophageal reflux disease without esophagitis: Secondary | ICD-10-CM | POA: Diagnosis present

## 2018-09-17 DIAGNOSIS — Z951 Presence of aortocoronary bypass graft: Secondary | ICD-10-CM | POA: Diagnosis not present

## 2018-09-17 DIAGNOSIS — C772 Secondary and unspecified malignant neoplasm of intra-abdominal lymph nodes: Secondary | ICD-10-CM | POA: Diagnosis present

## 2018-09-17 DIAGNOSIS — IMO0001 Reserved for inherently not codable concepts without codable children: Secondary | ICD-10-CM

## 2018-09-17 DIAGNOSIS — I2510 Atherosclerotic heart disease of native coronary artery without angina pectoris: Secondary | ICD-10-CM | POA: Diagnosis present

## 2018-09-17 DIAGNOSIS — Z9049 Acquired absence of other specified parts of digestive tract: Secondary | ICD-10-CM

## 2018-09-17 DIAGNOSIS — E119 Type 2 diabetes mellitus without complications: Secondary | ICD-10-CM

## 2018-09-17 HISTORY — PX: PARTIAL COLECTOMY: SHX5273

## 2018-09-17 LAB — GLUCOSE, CAPILLARY
GLUCOSE-CAPILLARY: 240 mg/dL — AB (ref 70–99)
GLUCOSE-CAPILLARY: 286 mg/dL — AB (ref 70–99)
Glucose-Capillary: 270 mg/dL — ABNORMAL HIGH (ref 70–99)
Glucose-Capillary: 233 mg/dL — ABNORMAL HIGH (ref 70–99)
Glucose-Capillary: 256 mg/dL — ABNORMAL HIGH (ref 70–99)

## 2018-09-17 SURGERY — COLECTOMY, PARTIAL
Anesthesia: General | Site: Abdomen

## 2018-09-17 MED ORDER — SIMVASTATIN 20 MG PO TABS: 80.0000 mg | ORAL_TABLET | Freq: Every day | ORAL | Status: DC

## 2018-09-17 MED ORDER — PANTOPRAZOLE SODIUM 40 MG PO TBEC
40.0000 mg | DELAYED_RELEASE_TABLET | Freq: Every day | ORAL | Status: DC
  Administered 2018-09-18 – 2018-09-20 (×3): 40 mg via ORAL
  Filled 2018-09-17 (×3): qty 1

## 2018-09-17 MED ORDER — LABETALOL HCL 5 MG/ML IV SOLN
INTRAVENOUS | Status: AC
  Filled 2018-09-17: qty 4

## 2018-09-17 MED ORDER — ONDANSETRON HCL 4 MG/2ML IJ SOLN
INTRAMUSCULAR | Status: AC
  Filled 2018-09-17: qty 2

## 2018-09-17 MED ORDER — LACTATED RINGERS IV SOLN
INTRAVENOUS | Status: DC
  Administered 2018-09-17: 11:00:00 via INTRAVENOUS
  Administered 2018-09-17: 1000 mL via INTRAVENOUS
  Administered 2018-09-17: 11:00:00 via INTRAVENOUS

## 2018-09-17 MED ORDER — LIDOCAINE 2% (20 MG/ML) 5 ML SYRINGE
INTRAMUSCULAR | Status: DC | PRN
  Administered 2018-09-17: 50 mg via INTRAVENOUS

## 2018-09-17 MED ORDER — SODIUM CHLORIDE 0.9 % IV SOLN
INTRAVENOUS | Status: AC
  Filled 2018-09-17: qty 2

## 2018-09-17 MED ORDER — POVIDONE-IODINE 10 % EX OINT
TOPICAL_OINTMENT | CUTANEOUS | Status: AC
  Filled 2018-09-17: qty 1

## 2018-09-17 MED ORDER — KETOROLAC TROMETHAMINE 30 MG/ML IJ SOLN
30.0000 mg | Freq: Four times a day (QID) | INTRAMUSCULAR | Status: AC
  Administered 2018-09-17: 30 mg via INTRAVENOUS
  Filled 2018-09-17: qty 1

## 2018-09-17 MED ORDER — ALVIMOPAN 12 MG PO CAPS
ORAL_CAPSULE | ORAL | Status: AC
  Filled 2018-09-17: qty 1

## 2018-09-17 MED ORDER — PROPOFOL 10 MG/ML IV BOLUS
INTRAVENOUS | Status: AC
  Filled 2018-09-17: qty 20

## 2018-09-17 MED ORDER — MIDAZOLAM HCL 2 MG/2ML IJ SOLN
INTRAMUSCULAR | Status: AC
  Filled 2018-09-17: qty 2

## 2018-09-17 MED ORDER — ACETAMINOPHEN 650 MG RE SUPP: 650.0000 mg | Freq: Four times a day (QID) | RECTAL | Status: DC | PRN

## 2018-09-17 MED ORDER — ONDANSETRON 4 MG PO TBDP: 4.0000 mg | ORAL_TABLET | Freq: Four times a day (QID) | ORAL | Status: DC | PRN

## 2018-09-17 MED ORDER — HYDROMORPHONE HCL 1 MG/ML IJ SOLN: 1.0000 mg | INTRAMUSCULAR | Status: DC | PRN

## 2018-09-17 MED ORDER — PROPOFOL 10 MG/ML IV BOLUS
INTRAVENOUS | Status: DC | PRN
  Administered 2018-09-17: 200 mg via INTRAVENOUS

## 2018-09-17 MED ORDER — HEMOSTATIC AGENTS (NO CHARGE) OPTIME
TOPICAL | Status: DC | PRN
  Administered 2018-09-17: 1 via TOPICAL

## 2018-09-17 MED ORDER — DIPHENHYDRAMINE HCL 12.5 MG/5ML PO ELIX: 12.5000 mg | ORAL_SOLUTION | Freq: Four times a day (QID) | ORAL | Status: DC | PRN

## 2018-09-17 MED ORDER — DIPHENHYDRAMINE HCL 50 MG/ML IJ SOLN: 12.5000 mg | Freq: Four times a day (QID) | INTRAMUSCULAR | Status: DC | PRN

## 2018-09-17 MED ORDER — MIDAZOLAM HCL 5 MG/5ML IJ SOLN
INTRAMUSCULAR | Status: DC | PRN
  Administered 2018-09-17: 2 mg via INTRAVENOUS

## 2018-09-17 MED ORDER — INSULIN ASPART 100 UNIT/ML ~~LOC~~ SOLN
8.0000 [IU] | Freq: Once | SUBCUTANEOUS | Status: AC
  Administered 2018-09-17: 8 [IU] via SUBCUTANEOUS

## 2018-09-17 MED ORDER — HYDROCODONE-ACETAMINOPHEN 7.5-325 MG PO TABS: 1.0000 | ORAL_TABLET | Freq: Once | ORAL | Status: DC | PRN

## 2018-09-17 MED ORDER — POVIDONE-IODINE 10 % EX OINT
TOPICAL_OINTMENT | CUTANEOUS | Status: AC
  Filled 2018-09-17: qty 2

## 2018-09-17 MED ORDER — BUPIVACAINE LIPOSOME 1.3 % IJ SUSP
INTRAMUSCULAR | Status: AC
  Filled 2018-09-17: qty 20

## 2018-09-17 MED ORDER — SODIUM CHLORIDE 0.9 % IV SOLN
2.0000 g | INTRAVENOUS | Status: AC
  Administered 2018-09-17: 2 g via INTRAVENOUS
  Filled 2018-09-17: qty 2

## 2018-09-17 MED ORDER — LORAZEPAM 2 MG/ML IJ SOLN: 1.0000 mg | INTRAMUSCULAR | Status: DC | PRN

## 2018-09-17 MED ORDER — EZETIMIBE-SIMVASTATIN 10-80 MG PO TABS: 1.0000 | ORAL_TABLET | Freq: Every day | ORAL | Status: DC

## 2018-09-17 MED ORDER — MIDAZOLAM HCL 2 MG/2ML IJ SOLN: 0.5000 mg | Freq: Once | INTRAMUSCULAR | Status: DC | PRN

## 2018-09-17 MED ORDER — DEXAMETHASONE SODIUM PHOSPHATE 10 MG/ML IJ SOLN
INTRAMUSCULAR | Status: DC | PRN
  Administered 2018-09-17: 4 mg via INTRAVENOUS

## 2018-09-17 MED ORDER — SUGAMMADEX SODIUM 200 MG/2ML IV SOLN
INTRAVENOUS | Status: DC | PRN
  Administered 2018-09-17: 200 mg via INTRAVENOUS

## 2018-09-17 MED ORDER — ONDANSETRON HCL 4 MG/2ML IJ SOLN
INTRAMUSCULAR | Status: DC | PRN
  Administered 2018-09-17: 4 mg via INTRAVENOUS

## 2018-09-17 MED ORDER — OXYCODONE-ACETAMINOPHEN 5-325 MG PO TABS
1.0000 | ORAL_TABLET | ORAL | Status: DC | PRN
  Administered 2018-09-17 – 2018-09-18 (×3): 1 via ORAL
  Filled 2018-09-17 (×3): qty 1

## 2018-09-17 MED ORDER — SUGAMMADEX SODIUM 200 MG/2ML IV SOLN
INTRAVENOUS | Status: AC
  Filled 2018-09-17: qty 2

## 2018-09-17 MED ORDER — HEMOSTATIC AGENTS (NO CHARGE) OPTIME
TOPICAL | Status: DC | PRN
  Administered 2018-09-17: 2 via TOPICAL

## 2018-09-17 MED ORDER — DEXAMETHASONE SODIUM PHOSPHATE 4 MG/ML IJ SOLN
INTRAMUSCULAR | Status: AC
  Filled 2018-09-17: qty 1

## 2018-09-17 MED ORDER — FENTANYL CITRATE (PF) 250 MCG/5ML IJ SOLN
INTRAMUSCULAR | Status: AC
  Filled 2018-09-17: qty 5

## 2018-09-17 MED ORDER — ACETAMINOPHEN 325 MG PO TABS: 650.0000 mg | ORAL_TABLET | Freq: Four times a day (QID) | ORAL | Status: DC | PRN

## 2018-09-17 MED ORDER — RAMIPRIL 5 MG PO CAPS
10.0000 mg | ORAL_CAPSULE | Freq: Every day | ORAL | Status: DC
  Administered 2018-09-18 – 2018-09-20 (×3): 10 mg via ORAL
  Filled 2018-09-17 (×3): qty 2

## 2018-09-17 MED ORDER — POVIDONE-IODINE 10 % OINT PACKET
TOPICAL_OINTMENT | CUTANEOUS | Status: DC | PRN
  Administered 2018-09-17: 1 via TOPICAL

## 2018-09-17 MED ORDER — ALVIMOPAN 12 MG PO CAPS
12.0000 mg | ORAL_CAPSULE | Freq: Two times a day (BID) | ORAL | Status: DC
  Administered 2018-09-18 (×2): 12 mg via ORAL
  Filled 2018-09-17 (×2): qty 1

## 2018-09-17 MED ORDER — HYDROMORPHONE HCL 1 MG/ML IJ SOLN: 0.2500 mg | INTRAMUSCULAR | Status: DC | PRN

## 2018-09-17 MED ORDER — BUPIVACAINE LIPOSOME 1.3 % IJ SUSP
INTRAMUSCULAR | Status: DC | PRN
  Administered 2018-09-17: 20 mL

## 2018-09-17 MED ORDER — DILTIAZEM HCL ER COATED BEADS 240 MG PO CP24
240.0000 mg | ORAL_CAPSULE | Freq: Every day | ORAL | Status: DC
  Administered 2018-09-18 – 2018-09-20 (×3): 240 mg via ORAL
  Filled 2018-09-17 (×3): qty 1

## 2018-09-17 MED ORDER — CHLORHEXIDINE GLUCONATE CLOTH 2 % EX PADS: 6.0000 | MEDICATED_PAD | Freq: Once | CUTANEOUS | Status: DC

## 2018-09-17 MED ORDER — INSULIN ASPART 100 UNIT/ML ~~LOC~~ SOLN
5.0000 [IU] | Freq: Once | SUBCUTANEOUS | Status: AC
  Administered 2018-09-17: 5 [IU] via SUBCUTANEOUS
  Filled 2018-09-17: qty 0.05

## 2018-09-17 MED ORDER — ONDANSETRON HCL 4 MG/2ML IJ SOLN
4.0000 mg | Freq: Four times a day (QID) | INTRAMUSCULAR | Status: DC | PRN
  Administered 2018-09-18: 4 mg via INTRAVENOUS
  Filled 2018-09-17: qty 2

## 2018-09-17 MED ORDER — INSULIN ASPART 100 UNIT/ML ~~LOC~~ SOLN
0.0000 [IU] | Freq: Three times a day (TID) | SUBCUTANEOUS | Status: DC
  Administered 2018-09-17: 8 [IU] via SUBCUTANEOUS
  Administered 2018-09-18: 5 [IU] via SUBCUTANEOUS

## 2018-09-17 MED ORDER — HYDROCHLOROTHIAZIDE 12.5 MG PO CAPS
12.5000 mg | ORAL_CAPSULE | Freq: Every day | ORAL | Status: DC
  Administered 2018-09-17 – 2018-09-20 (×4): 12.5 mg via ORAL
  Filled 2018-09-17 (×4): qty 1

## 2018-09-17 MED ORDER — PROMETHAZINE HCL 25 MG/ML IJ SOLN: 6.2500 mg | INTRAMUSCULAR | Status: DC | PRN

## 2018-09-17 MED ORDER — ROCURONIUM BROMIDE 50 MG/5ML IV SOLN
INTRAVENOUS | Status: AC
  Filled 2018-09-17: qty 1

## 2018-09-17 MED ORDER — KETOROLAC TROMETHAMINE 30 MG/ML IJ SOLN
30.0000 mg | Freq: Four times a day (QID) | INTRAMUSCULAR | Status: DC | PRN
  Administered 2018-09-17 – 2018-09-18 (×3): 30 mg via INTRAVENOUS
  Filled 2018-09-17 (×3): qty 1

## 2018-09-17 MED ORDER — ROCURONIUM BROMIDE 10 MG/ML (PF) SYRINGE
PREFILLED_SYRINGE | INTRAVENOUS | Status: DC | PRN
  Administered 2018-09-17: 10 mg via INTRAVENOUS
  Administered 2018-09-17: 50 mg via INTRAVENOUS

## 2018-09-17 MED ORDER — 0.9 % SODIUM CHLORIDE (POUR BTL) OPTIME
TOPICAL | Status: DC | PRN
  Administered 2018-09-17: 1000 mL

## 2018-09-17 MED ORDER — ALVIMOPAN 12 MG PO CAPS
12.0000 mg | ORAL_CAPSULE | ORAL | Status: AC
  Administered 2018-09-17: 12 mg via ORAL

## 2018-09-17 MED ORDER — METOPROLOL SUCCINATE ER 50 MG PO TB24
75.0000 mg | ORAL_TABLET | Freq: Two times a day (BID) | ORAL | Status: DC
  Administered 2018-09-17 – 2018-09-20 (×6): 75 mg via ORAL
  Filled 2018-09-17 (×6): qty 1

## 2018-09-17 MED ORDER — ENOXAPARIN SODIUM 40 MG/0.4ML ~~LOC~~ SOLN
40.0000 mg | Freq: Once | SUBCUTANEOUS | Status: AC
  Administered 2018-09-17: 40 mg via SUBCUTANEOUS

## 2018-09-17 MED ORDER — FENTANYL CITRATE (PF) 250 MCG/5ML IJ SOLN
INTRAMUSCULAR | Status: DC | PRN
  Administered 2018-09-17 (×6): 50 ug via INTRAVENOUS
  Administered 2018-09-17: 150 ug via INTRAVENOUS
  Administered 2018-09-17: 50 ug via INTRAVENOUS

## 2018-09-17 MED ORDER — NIACIN ER (ANTIHYPERLIPIDEMIC) 500 MG PO TBCR
500.0000 mg | EXTENDED_RELEASE_TABLET | Freq: Every day | ORAL | Status: DC
  Administered 2018-09-18 – 2018-09-19 (×2): 500 mg via ORAL
  Filled 2018-09-17 (×5): qty 1

## 2018-09-17 MED ORDER — ATORVASTATIN CALCIUM 40 MG PO TABS
40.0000 mg | ORAL_TABLET | Freq: Every day | ORAL | Status: DC
  Administered 2018-09-17 – 2018-09-19 (×3): 40 mg via ORAL
  Filled 2018-09-17 (×3): qty 1

## 2018-09-17 MED ORDER — LABETALOL HCL 5 MG/ML IV SOLN
INTRAVENOUS | Status: DC | PRN
  Administered 2018-09-17 (×4): 10 mg via INTRAVENOUS

## 2018-09-17 MED ORDER — EZETIMIBE 10 MG PO TABS
10.0000 mg | ORAL_TABLET | Freq: Every day | ORAL | Status: DC
  Administered 2018-09-17 – 2018-09-19 (×3): 10 mg via ORAL
  Filled 2018-09-17 (×3): qty 1

## 2018-09-17 MED ORDER — SIMETHICONE 80 MG PO CHEW
40.0000 mg | CHEWABLE_TABLET | Freq: Four times a day (QID) | ORAL | Status: DC | PRN
  Administered 2018-09-18 (×3): 40 mg via ORAL
  Filled 2018-09-17 (×3): qty 1

## 2018-09-17 MED ORDER — SODIUM CHLORIDE 0.9 % IV SOLN
INTRAVENOUS | Status: DC
  Administered 2018-09-17: 17:00:00 via INTRAVENOUS

## 2018-09-17 MED ORDER — ENOXAPARIN SODIUM 40 MG/0.4ML ~~LOC~~ SOLN
SUBCUTANEOUS | Status: AC
  Filled 2018-09-17: qty 0.4

## 2018-09-17 SURGICAL SUPPLY — 65 items
BARRIER SKIN 2 3/4 (OSTOMY) IMPLANT
BARRIER SKIN 2 3/4 INCH (OSTOMY)
BARRIER SKIN OD2.25 2 3/4 FLNG (OSTOMY) IMPLANT
BRR SKN FLT 2.75X2.25 2 PC (OSTOMY)
CELLS DAT CNTRL 66122 CELL SVR (MISCELLANEOUS) IMPLANT
CLAMP POUCH DRAINAGE QUIET (OSTOMY) IMPLANT
COVER LIGHT HANDLE STERIS (MISCELLANEOUS) ×6 IMPLANT
DRSG OPSITE POSTOP 4X10 (GAUZE/BANDAGES/DRESSINGS) ×2 IMPLANT
DRSG OPSITE POSTOP 4X8 (GAUZE/BANDAGES/DRESSINGS) IMPLANT
ELECT REM PT RETURN 9FT ADLT (ELECTROSURGICAL) ×3
ELECTRODE REM PT RTRN 9FT ADLT (ELECTROSURGICAL) ×1 IMPLANT
GLOVE BIO SURGEON STRL SZ 6.5 (GLOVE) ×4 IMPLANT
GLOVE BIO SURGEONS STRL SZ 6.5 (GLOVE) ×2
GLOVE BIOGEL PI IND STRL 6.5 (GLOVE) ×2 IMPLANT
GLOVE BIOGEL PI IND STRL 7.0 (GLOVE) ×5 IMPLANT
GLOVE BIOGEL PI INDICATOR 6.5 (GLOVE) ×4
GLOVE BIOGEL PI INDICATOR 7.0 (GLOVE) ×10
GLOVE SURG SS PI 7.5 STRL IVOR (GLOVE) ×6 IMPLANT
GOWN STRL REUS W/ TWL XL LVL3 (GOWN DISPOSABLE) ×2 IMPLANT
GOWN STRL REUS W/TWL LRG LVL3 (GOWN DISPOSABLE) ×12 IMPLANT
GOWN STRL REUS W/TWL XL LVL3 (GOWN DISPOSABLE) ×6
HEMOSTAT ARISTA ABSORB 3G PWDR (MISCELLANEOUS) ×2 IMPLANT
HEMOSTAT SNOW SURGICEL 2X4 (HEMOSTASIS) ×4 IMPLANT
INST SET MAJOR GENERAL (KITS) ×3 IMPLANT
KIT TURNOVER KIT A (KITS) ×3 IMPLANT
LIGASURE IMPACT 36 18CM CVD LR (INSTRUMENTS) ×3 IMPLANT
MANIFOLD NEPTUNE II (INSTRUMENTS) ×3 IMPLANT
NDL HYPO 18GX1.5 BLUNT FILL (NEEDLE) ×1 IMPLANT
NEEDLE HYPO 18GX1.5 BLUNT FILL (NEEDLE) ×3 IMPLANT
NS IRRIG 1000ML POUR BTL (IV SOLUTION) ×6 IMPLANT
PACK COLON (CUSTOM PROCEDURE TRAY) ×3 IMPLANT
PAD ARMBOARD 7.5X6 YLW CONV (MISCELLANEOUS) ×3 IMPLANT
PENCIL HANDSWITCHING (ELECTRODE) ×3 IMPLANT
POUCH OSTOMY 2 3/4  H 3804 (WOUND CARE)
POUCH OSTOMY 2 3/4 H 3804 (WOUND CARE)
POUCH OSTOMY 2 PC DRNBL 2.25 (WOUND CARE) IMPLANT
POUCH OSTOMY 2 PC DRNBL 2.75 (WOUND CARE) IMPLANT
POUCH OSTOMY DRNBL 2 1/4 (WOUND CARE)
RELOAD LINEAR CUT PROX 55 BLUE (ENDOMECHANICALS) IMPLANT
RELOAD PROXIMATE 75MM BLUE (ENDOMECHANICALS) ×6 IMPLANT
RELOAD STAPLE 55 3.8 BLU REG (ENDOMECHANICALS) IMPLANT
RELOAD STAPLE 75 3.8 BLU REG (ENDOMECHANICALS) IMPLANT
RETRACTOR WND ALEXIS 18 MED (MISCELLANEOUS) IMPLANT
RETRACTOR WND ALEXIS 25 LRG (MISCELLANEOUS) IMPLANT
RETRACTOR WOUND ALXS 34CM XLRG (MISCELLANEOUS) IMPLANT
RTRCTR WOUND ALEXIS 18CM MED (MISCELLANEOUS)
RTRCTR WOUND ALEXIS 25CM LRG (MISCELLANEOUS) ×3
RTRCTR WOUND ALEXIS 34CM XLRG (MISCELLANEOUS) ×3
SHEET LAVH (DRAPES) ×2 IMPLANT
SPONGE LAP 18X18 X RAY DECT (DISPOSABLE) ×8 IMPLANT
SPONGE SURGIFOAM ABS GEL 100 (HEMOSTASIS) ×2 IMPLANT
STAPLER GUN LINEAR PROX 60 (STAPLE) ×3 IMPLANT
STAPLER PROXIMATE 55 BLUE (STAPLE) IMPLANT
STAPLER PROXIMATE 75MM BLUE (STAPLE) ×2 IMPLANT
STAPLER VISISTAT (STAPLE) ×5 IMPLANT
SUT CHROMIC 0 SH (SUTURE) IMPLANT
SUT CHROMIC 2 0 SH (SUTURE) IMPLANT
SUT CHROMIC 3 0 SH 27 (SUTURE) IMPLANT
SUT NOVA NAB GS-26 0 60 (SUTURE) ×4 IMPLANT
SUT PDS AB 0 CTX 60 (SUTURE) IMPLANT
SUT SILK 2 0 (SUTURE)
SUT SILK 2-0 18XBRD TIE 12 (SUTURE) IMPLANT
SUT SILK 3 0 SH CR/8 (SUTURE) ×5 IMPLANT
TRAY FOLEY MTR SLVR 16FR STAT (SET/KITS/TRAYS/PACK) ×3 IMPLANT
YANKAUER SUCT BULB TIP 10FT TU (MISCELLANEOUS) ×3 IMPLANT

## 2018-09-17 NOTE — Transfer of Care (Signed)
Immediate Anesthesia Transfer of Care Note  Patient: Ian [name]  Procedure(s) Performed: PARTIAL COLECTOMY (N/A )  Patient Location: PACU  Anesthesia Type:General  Level of Consciousness: sedated  Airway & Oxygen Therapy: Patient Spontanous Breathing and Patient connected to face mask oxygen  Post-op Assessment: Report given to RN and Post -op Vital signs reviewed and stable  Post vital signs: Reviewed and stable  Last Vitals:  Vitals Value Taken Time  BP 158/78 09/17/2018  1:06 PM  Temp    Pulse 86 09/17/2018  1:07 PM  Resp 8 09/17/2018  1:07 PM  SpO2 99 % 09/17/2018  1:07 PM  Vitals shown include unvalidated device data.  Last Pain:  Vitals:   09/17/18 0930  PainSc: 0-No pain      Patients Stated Pain Goal: 4 (24/29/98 0699)  Complications: No apparent anesthesia complications

## 2018-09-17 NOTE — Anesthesia Procedure Notes (Signed)
Procedure Name: Intubation Date/Time: 09/17/2018 10:57 AM Performed by: Talbot Grumbling, CRNA Pre-anesthesia Checklist: Patient identified, Emergency Drugs available, Suction available and Patient being monitored Patient Re-evaluated:Patient Re-evaluated prior to induction Oxygen Delivery Method: Circle system utilized Preoxygenation: Pre-oxygenation with 100% oxygen Induction Type: IV induction Ventilation: Mask ventilation without difficulty Laryngoscope Size: Mac and 3 Grade View: Grade I Tube type: Oral Tube size: 8.0 mm Number of attempts: 1 Airway Equipment and Method: Stylet Placement Confirmation: ETT inserted through vocal cords under direct vision,  breath sounds checked- equal and bilateral and positive ETCO2 Secured at: 22 cm Tube secured with: Tape Dental Injury: Teeth and Oropharynx as per pre-operative assessment

## 2018-09-17 NOTE — Anesthesia Postprocedure Evaluation (Signed)
Anesthesia Post Note  Patient: Ian [name]  Procedure(s) Performed: PARTIAL COLECTOMY WITH PARTIAL WEDGE RESECTION LIVER METASTASIS (N/A Abdomen)  Patient location during evaluation: PACU Anesthesia Type: General Level of consciousness: awake and alert and oriented Pain management: pain level controlled Vital Signs Assessment: post-procedure vital signs reviewed and stable Respiratory status: spontaneous breathing Cardiovascular status: stable Postop Assessment: no apparent nausea or vomiting Anesthetic complications: no     Last Vitals:  Vitals:   09/17/18 1400 09/17/18 1415  BP: (!) 116/54 117/66  Pulse: 87 88  Resp: 17 16  Temp:    SpO2: 97% 98%    Last Pain:  Vitals:   09/17/18 1400  PainSc: 3                  Brinson Tozzi A

## 2018-09-17 NOTE — Op Note (Signed)
Patient:  Ian [name]  DOB:  09-04-1956  MRN:  [id number]   Preop Diagnosis: Colon carcinoma  Postop Diagnosis: Same, liver mass  Procedure: Partial colectomy, wedge resection of liver mass  Surgeon: Aviva Signs, MD  Assistant: Blake Divine, MD  Anes: General tracheal  Indications: Patient is a 62 year old white male who was found on colonoscopy to have adenocarcinoma invading the submucosa at the stalk of a colon polyp that was removed in the sigmoid colon region.  Patient now comes for a partial colectomy.  The risks and benefits of the procedure including bleeding, infection, anastomotic leak, cardiopulmonary difficulties, and the possibility of a blood transfusion were fully explained to the patient, who gave informed consent.  Procedure note: The patient was placed in low lithotomy position after induction of general endotracheal anesthesia.  The abdomen and perineum were prepped and draped using usual sterile technique with ChloraPrep and Betadine.  Surgical site confirmation was performed.  A midline incision was made from just above the umbilicus to the suprapubic region.  The peritoneal cavity was entered into without difficulty.  Some omentum was stuck to the underside of a previously placed umbilical hernia patch.  This was divided using the LigaSure.  On inspection of the liver, a 2 to 3 cm hard nodule was noted on the surface of the right lobe of the liver.  This grossly appeared to be metastatic disease.  A wedge resection was then performed using Bovie electrocautery.  The specimen was sent to pathology for examination.  The right upper quadrant over the wound was then packed with Surgicel and a packing.  We then proceeded to the partial colectomy.  The sigmoid colon was mobilized along its peritoneal reflection.  Care was taken to avoid the left ureter.  In the midportion of the colon, a scarred area was noted.  A GIA-75 stapler was placed down towards the colorectal  juncture and in the proximal sigmoid colon.  The mesentery was then divided using the LigaSure.  A suture was placed proximally on the specimen for orientation purposes.  Specimen was opened in the operating room and the area of the polyps of concern were within the specimen removed.  The specimen was then sent to pathology for examination.  A side-to-side colocolostomy was performed using a GIA-75 stapler.  The colotomy was closed using a TA 60 stapler.  Staple line was bolstered using 3-0 silk sutures.  Surrounding adipose tissue was placed over the staple line.  An adequate anastomosis was palpated.  We next then look back at the liver wedge resection site.  No significant bleeding was noted.  Arista, Surgicel, and Gelfoam were placed over this area.  The pelvis was then copiously irrigated with normal saline.  All tapes were then removed.  All operating personnel then changed her gown and gloves.  A new set-up was used for closure.  The fascia was reapproximated using a looped 0 Novafil running suture.  Subcutaneous layer was irrigated with normal saline.  The wound was injected with Exparel.  The skin was closed using staples.  Betadine ointment and dry sterile dressing were applied.  All tape and needle counts were correct at the end of the procedure.  Patient was extubated in the operating room and transferred to PACU in stable condition.  Complications: None  EBL: 250 cc  Specimen: Sigmoid colon, liver mass

## 2018-09-17 NOTE — Interval H&P Note (Signed)
History and Physical Interval Note:  09/17/2018 10:00 AM  Ian [name]  has presented today for surgery, with the diagnosis of sigmoid colon cancer  The various methods of treatment have been discussed with the patient and family. After consideration of risks, benefits and other options for treatment, the patient has consented to  Procedure(s): PARTIAL COLECTOMY (N/A) as a surgical intervention .  The patient's history has been reviewed, patient examined, no change in status, stable for surgery.  I have reviewed the patient's chart and labs.  Questions were answered to the patient's satisfaction.     Aviva Signs

## 2018-09-17 NOTE — Anesthesia Preprocedure Evaluation (Signed)
Anesthesia Evaluation  General Assessment Comment:Pt and sister have known Pseudocholinesterase deficiency  History of Anesthesia Complications (+) Family history of anesthesia reaction  Airway Mallampati: II  TM Distance: >3 FB Neck ROM: Full    Dental no notable dental hx. (+) Teeth Intact   Pulmonary sleep apnea and Continuous Positive Airway Pressure Ventilation ,    Pulmonary exam normal breath sounds clear to auscultation       Cardiovascular Exercise Tolerance: Good hypertension, + CAD and + CABG  Normal cardiovascular exam Rhythm:Regular Rate:Normal  States has good ET Denies MI, or recent CP Denies using NTG CABG ~10 years ago    Neuro/Psych    GI/Hepatic GERD  Medicated and Controlled,  Endo/Other  diabetes, Type 1, Insulin DependentGlc 240 this AM  Given 5 u of reg insulin  Renal/GU      Musculoskeletal   Abdominal   Peds  Hematology   Anesthesia Other Findings   Reproductive/Obstetrics                             Anesthesia Physical Anesthesia Plan  ASA: II  Anesthesia Plan: General   Post-op Pain Management:    Induction: Intravenous  PONV Risk Score and Plan:   Airway Management Planned: Oral ETT  Additional Equipment:   Intra-op Plan:   Post-operative Plan: Extubation in OR  Informed Consent: I have reviewed the patients History and Physical, chart, labs and discussed the procedure including the risks, benefits and alternatives for the proposed anesthesia with the patient or authorized representative who has indicated his/her understanding and acceptance.   Dental advisory given  Plan Discussed with: CRNA  Anesthesia Plan Comments:         Anesthesia Quick Evaluation

## 2018-09-18 ENCOUNTER — Encounter (HOSPITAL_COMMUNITY): Payer: Self-pay | Admitting: General Surgery

## 2018-09-18 LAB — CBC
HCT: 34 % — ABNORMAL LOW (ref 39.0–52.0)
HEMOGLOBIN: 11.1 g/dL — AB (ref 13.0–17.0)
MCH: 29.1 pg (ref 26.0–34.0)
MCHC: 32.6 g/dL (ref 30.0–36.0)
MCV: 89.2 fL (ref 80.0–100.0)
NRBC: 0 % (ref 0.0–0.2)
Platelets: 264 10*3/uL (ref 150–400)
RBC: 3.81 MIL/uL — AB (ref 4.22–5.81)
RDW: 13.6 % (ref 11.5–15.5)
WBC: 17 10*3/uL — ABNORMAL HIGH (ref 4.0–10.5)

## 2018-09-18 LAB — BASIC METABOLIC PANEL
Anion gap: 10 (ref 5–15)
BUN: 18 mg/dL (ref 8–23)
CHLORIDE: 101 mmol/L (ref 98–111)
CO2: 23 mmol/L (ref 22–32)
CREATININE: 1.07 mg/dL (ref 0.61–1.24)
Calcium: 8.3 mg/dL — ABNORMAL LOW (ref 8.9–10.3)
GFR calc Af Amer: >60 mL/min (ref >60)
GFR calc non Af Amer: >60 mL/min (ref >60)
GLUCOSE: 254 mg/dL — AB (ref 70–99)
POTASSIUM: 3.7 mmol/L (ref 3.5–5.1)
Sodium: 134 mmol/L — ABNORMAL LOW (ref 135–145)

## 2018-09-18 LAB — MAGNESIUM: Magnesium: 1.7 mg/dL (ref 1.7–2.4)

## 2018-09-18 LAB — GLUCOSE, CAPILLARY
GLUCOSE-CAPILLARY: 257 mg/dL — AB (ref 70–99)
GLUCOSE-CAPILLARY: 271 mg/dL — AB (ref 70–99)
GLUCOSE-CAPILLARY: 267 mg/dL — AB (ref 70–99)
Glucose-Capillary: 230 mg/dL — ABNORMAL HIGH (ref 70–99)

## 2018-09-18 LAB — HEMOGLOBIN A1C
Hgb A1c MFr Bld: 9.3 % — ABNORMAL HIGH (ref 4.8–5.6)
Mean Plasma Glucose: 220.21 mg/dL

## 2018-09-18 LAB — PHOSPHORUS: Phosphorus: 3.8 mg/dL (ref 2.5–4.6)

## 2018-09-18 MED ORDER — INSULIN GLARGINE 100 UNIT/ML ~~LOC~~ SOLN
15.0000 [IU] | Freq: Every day | SUBCUTANEOUS | Status: DC
  Administered 2018-09-18: 15 [IU] via SUBCUTANEOUS
  Filled 2018-09-18 (×2): qty 0.15

## 2018-09-18 MED ORDER — KCL IN DEXTROSE-NACL 20-5-0.45 MEQ/L-%-% IV SOLN
INTRAVENOUS | Status: DC
  Administered 2018-09-18 (×2): via INTRAVENOUS

## 2018-09-18 MED ORDER — INSULIN ASPART 100 UNIT/ML ~~LOC~~ SOLN
0.0000 [IU] | Freq: Every day | SUBCUTANEOUS | Status: DC
  Administered 2018-09-18: 3 [IU] via SUBCUTANEOUS

## 2018-09-18 MED ORDER — INSULIN ASPART 100 UNIT/ML ~~LOC~~ SOLN
0.0000 [IU] | Freq: Three times a day (TID) | SUBCUTANEOUS | Status: DC
  Administered 2018-09-18 (×2): 8 [IU] via SUBCUTANEOUS

## 2018-09-18 NOTE — Progress Notes (Addendum)
Inpatient Diabetes Program Recommendations  AACE/ADA: New Consensus Statement on Inpatient Glycemic Control (2019)  Target Ranges:  Prepandial:   less than 140 mg/dL      Peak postprandial:   less than 180 mg/dL (1-2 hours)      Critically ill patients:  140 - 180 mg/dL  Results for Ian [name], Ian [name] (MRN [id number]) as of 09/18/2018 09:18  Ref. Range 09/17/2018 09:37 09/17/2018 13:10 09/17/2018 14:31 09/17/2018 16:34 09/17/2018 21:05 09/18/2018 07:42  Glucose-Capillary Latest Ref Range: 70 - 99 mg/dL 240 (H) 270 (H) 233 (H) 256 (H) 286 (H) 230 (H)   Results for Ian [name], Ian [name] (MRN [id number]) as of 09/18/2018 09:18  Ref. Range 09/12/2018 14:19  Hemoglobin A1C Latest Ref Range: 4.8 - 5.6 % 9.5 (H)   Review of Glycemic Control  Diabetes history: DM2 Outpatient Diabetes medications: Lantus 20 units QHS, Metformin 1000 mg BID, Invokana 300 mg daily, 75/25 16 units TID with meals, Tradjenta 5 mg daily Current orders for Inpatient glycemic control: Novolog 0-15 units TID with meals  Inpatient Diabetes Program Recommendations: Insulin - Basal: Please consider ordering Lantus 15 units Q24H starting now. Correction (SSI): Please consider ordering Novolog 0-5 units QHS for bedtime correction. HgbA1C: A1C 9.5% on 09/12/18 indicating an average glucose of 226 mg/dl over the past 2-3 months.  NOTE: Spoke with patient over the phone about diabetes and home regimen for diabetes control. Patient reports that he is followed by PCP for diabetes management and currently he takes Lantus 20 units QHS, Metformin 1000 mg BID, Invokana 300 mg daily, 75/25 16 units TID with meals, Tradjenta 5 mg daily as an outpatient for diabetes control. Patient reports that he is taking DM medications as prescribed.   Patient states that he checks his glucose 5 times per day and that it is usually in the 120-190's mg/dl. Patient reports that his glucose is highest in the mornings (usually 160-190's mg/dl).  Inquired about  prior A1C and patient reports that his last A1C value was higher than usual (it was 9% but prior to that it was 8%). Discussed A1C results (9.5% on 09/12/18) and explained that his current A1C indicates an average glucose of 226 mg/dl over the past 2-3 months. Discussed glucose and A1C goals. Discussed importance of checking CBGs and maintaining good CBG control to prevent long-term and short-term complications. Stressed to the patient the importance of improving glycemic control to prevent further complications from uncontrolled diabetes especially following surgery. Discussed impact of nutrition, exercise, stress, sickness, and medications on diabetes control. Informed patient he received one time dose of Decadron on 09/17/18 which is contributing to hyperglycemia. Explained that patient may need adjustments with DM medications as an outpatient depending on how glucose trends at home. Encouraged patient to stay in close contact with his PCP regarding glycemic control and to ask for recommendations for adjustments if he is having any hypoglycemia or consistent glucose greater than 180 mg/dl. Informed patient of Lantus being added today which will help with noted hyperglycemia. Patient verbalized understanding of information discussed and he states that he has no further questions at this time related to diabetes.   Thanks, Barnie Alderman, RN, MSN, CDE Diabetes Coordinator Inpatient Diabetes Program [phone] (Team Pager from 8am to 5pm)

## 2018-09-18 NOTE — Progress Notes (Signed)
1 Day Post-Op  Subjective: Patient denies any incisional pain.  States he feels he is passing gas.  No nausea or vomiting noted.  Objective: Vital signs in last 24 hours: Temp:  [97.6 F (36.4 C)-99.1 F (37.3 C)] 98.3 F (36.8 C) (10/31 0501) Pulse Rate:  [86-98] 92 (10/31 0501) Resp:  [13-23] 18 (10/31 0501) BP: (115-158)/(54-85) 154/85 (10/31 0501) SpO2:  [96 %-100 %] 97 % (10/31 0501) FiO2 (%):  [2 %] 2 % (10/30 1907) Last BM Date: 09/16/18  Intake/Output from previous day: 10/30 0701 - 10/31 0700 In: 4072.4 [P.O.:1020; I.V.:3052.4] Out: 2100 [Urine:1900; Blood:200] Intake/Output this shift: No intake/output data recorded.  General appearance: alert, cooperative and no distress Resp: clear to auscultation bilaterally Cardio: regular rate and rhythm, S1, S2 normal, no murmur, click, rub or gallop GI: Soft, occasional bowel sounds appreciated.  Incision healing well.  Lab Results:  Recent Labs    09/18/18 0508  WBC 17.0*  HGB 11.1*  HCT 34.0*  PLT 264   BMET Recent Labs    09/18/18 0508  NA 134*  K 3.7  CL 101  CO2 23  GLUCOSE 254*  BUN 18  CREATININE 1.07  CALCIUM 8.3*   PT/INR No results for input(s): LABPROT, INR in the last 72 hours.  Studies/Results: No results found.  Anti-infectives: Anti-infectives (From admission, onward)   Start     Dose/Rate Route Frequency Ordered Stop   09/17/18 1000  cefoTEtan (CEFOTAN) 2 g in sodium chloride 0.9 % 100 mL IVPB     2 g 200 mL/hr over 30 Minutes Intravenous On call to O.R. 09/17/18 0938 09/17/18 1118      Assessment/Plan: s/p Procedure(s): PARTIAL COLECTOMY WITH PARTIAL WEDGE RESECTION LIVER METASTASIS Impression: Stable on postoperative day 1.  Will adjust IV fluids.  Hemoglobin within normal limits.  Leukocytosis probably secondary to leukemoid reaction.  Will remove Foley.  Will advance to full liquid diet.  LOS: 1 day    Aviva Signs 09/18/2018

## 2018-09-19 LAB — CBC
HCT: 34.4 % — ABNORMAL LOW (ref 39.0–52.0)
HEMOGLOBIN: 10.8 g/dL — AB (ref 13.0–17.0)
MCH: 28.6 pg (ref 26.0–34.0)
MCHC: 31.4 g/dL (ref 30.0–36.0)
MCV: 91 fL (ref 80.0–100.0)
NRBC: 0 % (ref 0.0–0.2)
PLATELETS: 237 10*3/uL (ref 150–400)
RBC: 3.78 MIL/uL — AB (ref 4.22–5.81)
RDW: 13.7 % (ref 11.5–15.5)
WBC: 11.3 10*3/uL — AB (ref 4.0–10.5)

## 2018-09-19 LAB — GLUCOSE, CAPILLARY
GLUCOSE-CAPILLARY: 182 mg/dL — AB (ref 70–99)
GLUCOSE-CAPILLARY: 173 mg/dL — AB (ref 70–99)
Glucose-Capillary: 240 mg/dL — ABNORMAL HIGH (ref 70–99)
Glucose-Capillary: 247 mg/dL — ABNORMAL HIGH (ref 70–99)

## 2018-09-19 LAB — BASIC METABOLIC PANEL
ANION GAP: 7 (ref 5–15)
BUN: 11 mg/dL (ref 8–23)
CHLORIDE: 108 mmol/L (ref 98–111)
CO2: 24 mmol/L (ref 22–32)
Calcium: 8.5 mg/dL — ABNORMAL LOW (ref 8.9–10.3)
Creatinine, Ser: 0.93 mg/dL (ref 0.61–1.24)
Glucose, Bld: 234 mg/dL — ABNORMAL HIGH (ref 70–99)
POTASSIUM: 3.7 mmol/L (ref 3.5–5.1)
SODIUM: 139 mmol/L (ref 135–145)

## 2018-09-19 LAB — PHOSPHORUS: PHOSPHORUS: 1.6 mg/dL — AB (ref 2.5–4.6)

## 2018-09-19 LAB — MAGNESIUM: MAGNESIUM: 2.2 mg/dL (ref 1.7–2.4)

## 2018-09-19 MED ORDER — INSULIN ASPART 100 UNIT/ML ~~LOC~~ SOLN
0.0000 [IU] | Freq: Three times a day (TID) | SUBCUTANEOUS | Status: DC
  Administered 2018-09-19: 3 [IU] via SUBCUTANEOUS
  Administered 2018-09-19 – 2018-09-20 (×3): 5 [IU] via SUBCUTANEOUS

## 2018-09-19 MED ORDER — INSULIN GLARGINE 100 UNIT/ML ~~LOC~~ SOLN
20.0000 [IU] | Freq: Every day | SUBCUTANEOUS | Status: DC
  Administered 2018-09-19 – 2018-09-20 (×2): 20 [IU] via SUBCUTANEOUS
  Filled 2018-09-19 (×4): qty 0.2

## 2018-09-19 MED ORDER — INSULIN ASPART 100 UNIT/ML ~~LOC~~ SOLN
5.0000 [IU] | Freq: Three times a day (TID) | SUBCUTANEOUS | Status: DC
  Administered 2018-09-19 – 2018-09-20 (×4): 5 [IU] via SUBCUTANEOUS

## 2018-09-19 MED ORDER — LINAGLIPTIN 5 MG PO TABS
5.0000 mg | ORAL_TABLET | Freq: Every day | ORAL | Status: DC
  Administered 2018-09-19 – 2018-09-20 (×2): 5 mg via ORAL
  Filled 2018-09-19 (×2): qty 1

## 2018-09-19 MED ORDER — METFORMIN HCL 500 MG PO TABS
1000.0000 mg | ORAL_TABLET | Freq: Two times a day (BID) | ORAL | Status: DC
  Administered 2018-09-19 – 2018-09-20 (×2): 1000 mg via ORAL
  Filled 2018-09-19 (×2): qty 2

## 2018-09-19 MED ORDER — CANAGLIFLOZIN 300 MG PO TABS
300.0000 mg | ORAL_TABLET | Freq: Every day | ORAL | Status: DC
  Administered 2018-09-20: 300 mg via ORAL
  Filled 2018-09-19 (×4): qty 1

## 2018-09-19 MED ORDER — INSULIN ASPART 100 UNIT/ML ~~LOC~~ SOLN: 0.0000 [IU] | Freq: Every day | SUBCUTANEOUS | Status: DC

## 2018-09-19 NOTE — Progress Notes (Signed)
2 Days Post-Op  Subjective: Patient had a bowel movement this morning.  He is also passing gas.  He is tolerating full liquid diet well.  Objective: Vital signs in last 24 hours: Temp:  [97.6 F (36.4 C)-98.9 F (37.2 C)] 98.6 F (37 C) (11/01 0548) Pulse Rate:  [87-96] 93 (11/01 0548) Resp:  [18-20] 18 (11/01 0548) BP: (140-166)/(59-73) 166/71 (11/01 0548) SpO2:  [92 %-98 %] 96 % (11/01 0548) Last BM Date: 09/16/18  Intake/Output from previous day: 10/31 0701 - 11/01 0700 In: 3297.4 [P.O.:1150; I.V.:2147.4] Out: 2850 [Urine:2850] Intake/Output this shift: No intake/output data recorded.  General appearance: alert, cooperative and no distress Resp: clear to auscultation bilaterally Cardio: regular rate and rhythm, S1, S2 normal, no murmur, click, rub or gallop GI: Soft, bowel sounds active.  Incision healing well.  Lab Results:  Recent Labs    09/18/18 0508 09/19/18 0418  WBC 17.0* 11.3*  HGB 11.1* 10.8*  HCT 34.0* 34.4*  PLT 264 237   BMET Recent Labs    09/18/18 0508 09/19/18 0418  NA 134* 139  K 3.7 3.7  CL 101 108  CO2 23 24  GLUCOSE 254* 234*  BUN 18 11  CREATININE 1.07 0.93  CALCIUM 8.3* 8.5*   PT/INR No results for input(s): LABPROT, INR in the last 72 hours.  Studies/Results: No results found.  Anti-infectives: Anti-infectives (From admission, onward)   Start     Dose/Rate Route Frequency Ordered Stop   09/17/18 1000  cefoTEtan (CEFOTAN) 2 g in sodium chloride 0.9 % 100 mL IVPB     2 g 200 mL/hr over 30 Minutes Intravenous On call to O.R. 09/17/18 0938 09/17/18 1118      Assessment/Plan: s/p Procedure(s): PARTIAL COLECTOMY WITH PARTIAL WEDGE RESECTION LIVER METASTASIS Impression: Stable on postoperative day 2.  Patient still running high blood sugars.  As his diet is being advanced, will restart his oral hypoglycemics.  Appreciate diabetic nurse care.  Final pathology pending.  Anticipate discharge in next 24 to 48 hours.  LOS: 2 days     Aviva Signs 09/19/2018

## 2018-09-19 NOTE — Progress Notes (Signed)
Inpatient Diabetes Program Recommendations  AACE/ADA: New Consensus Statement on Inpatient Glycemic Control (2019)  Target Ranges:  Prepandial:   less than 140 mg/dL      Peak postprandial:   less than 180 mg/dL (1-2 hours)      Critically ill patients:  140 - 180 mg/dL  Results for Ian [name], Ian [name] (MRN [id number]) as of 09/19/2018 07:15  Ref. Range 09/18/2018 07:42 09/18/2018 11:04 09/18/2018 16:24 09/18/2018 21:31  Glucose-Capillary Latest Ref Range: 70 - 99 mg/dL 230 (H) 257 (H) 271 (H) 267 (H)   Results for Ian [name], Ian [name] (MRN [id number]) as of 09/19/2018 07:15  Ref. Range 09/12/2018 14:19 09/18/2018 05:08  Hemoglobin A1C Latest Ref Range: 4.8 - 5.6 % 9.5 (H) 9.3 (H)   Review of Glycemic Control  Diabetes history: DM2 Outpatient Diabetes medications: Lantus 20 units QHS, Metformin 1000 mg BID, Invokana 300 mg daily, 75/25 16 units TID with meals, Tradjenta 5 mg daily Current orders for Inpatient glycemic control: Lantus 15 units QHS, Novolog 0-15 units TID with meals, Novolog 0-5 units QHS  Inpatient Diabetes Program Recommendations:  Insulin - Basal: Please consider increasing Lantus 20 units QHS. Insulin-Meal Coverage: Please consider ordering Novolog 5 units TID with meals for meal coverage if patient eats at least 50% of meals.  Thanks, Barnie Alderman, RN, MSN, CDE Diabetes Coordinator Inpatient Diabetes Program [phone] (Team Pager from 8am to 5pm)

## 2018-09-19 NOTE — Progress Notes (Signed)
Patient had small BM with a small amount of blood in it

## 2018-09-20 LAB — GLUCOSE, CAPILLARY: Glucose-Capillary: 203 mg/dL — ABNORMAL HIGH (ref 70–99)

## 2018-09-20 MED ORDER — OXYCODONE-ACETAMINOPHEN 7.5-325 MG PO TABS: 1.0000 | ORAL_TABLET | Freq: Four times a day (QID) | ORAL | 0 refills | Status: DC | PRN

## 2018-09-20 NOTE — Discharge Instructions (Signed)
Open Colectomy, Care After °This sheet gives you information about how to care for yourself after your procedure. Your health care provider may also give you more specific instructions. If you have problems or questions, contact your health care provider. °What can I expect after the procedure? °After the procedure, it is common to have: °· Pain in your abdomen, especially along your incision. °· Tiredness. Your energy level will return to normal over the next several weeks. °· Constipation. °· Nausea. °· Difficulty urinating. ° °Follow these instructions at home: °Activity °· You may be able to return to most of your normal activities within 1-2 weeks, such as working, walking up stairs, and sexual activity. °· Avoid activities that require a lot of energy for 4-6 weeks after surgery, such as running, climbing, and lifting heavy objects. Ask your health care provider what activities are safe for you. °· Take rest breaks during the day as needed. °· Do not drive for 1-2 weeks or until your health care provider says that it is safe. °· Do not drive or use heavy machinery while taking prescription pain medicines. °· Do not lift anything that is heavier than 10 lb (4.3 kg) until your health care provider says that it is safe. °Incision care °· Follow instructions from your health care provider about how to take care of your incision. Make sure you: °? Wash your hands with soap and water before you change your bandage (dressing). If soap and water are not available, use hand sanitizer. °? Change your dressing as told by your health care provider. °? Leave stitches (sutures) or staples in place. These skin closures may need to stay in place for 2 weeks or longer. °· Avoid wearing tight clothing around your incision. °· Protect your incision area from the sun. °· Check your incision area every day for signs of infection. Check for: °? More redness, swelling, or pain. °? More fluid or blood. °? Warmth. °? Pus or a bad  smell. °General instructions °· Do not take baths, swim, or use a hot tub until your health care provider approves. Ask your health care provider when you may shower. °· Take over-the-counter and prescription medicines, including stool softeners, only as told by your health care provider. °· Eat a low-fat and low-fiber diet for the first 4 weeks after surgery. °· Keep all follow-up visits as told by your health care provider. This is important. °Contact a health care provider if: °· You have more redness, swelling, or pain around your incision. °· You have more fluid or blood coming from your incision. °· Your incision feels warm to the touch. °· You have pus or a bad smell coming from your incision. °· You have a fever or chills. °· You do not have a bowel movement 2-3 days after surgery. °· You cannot eat or drink for 24 hours or more. °· You have persistent nausea and vomiting. °· You have abdominal pain that gets worse and does not get better with medicine. °Get help right away if: °· You have chest pain. °· You have shortness of breath. °· You have pain or swelling in your legs. °· Your incision breaks open after your sutures or staples have been removed. °· You have bleeding from the rectum. °This information is not intended to replace advice given to you by your health care provider. Make sure you discuss any questions you have with your health care provider. °Document Released: 05/29/2011 Document Revised: 08/06/2016 Document Reviewed: 08/06/2016 °Elsevier Interactive Patient   Education © 2018 [employer] ° °

## 2018-09-20 NOTE — Progress Notes (Signed)
Patient discharge to home take to car via wheelchair.  Discharge instruction given to wife.

## 2018-09-20 NOTE — Discharge Summary (Signed)
Physician Discharge Summary  Patient ID: Ian [name] MRN: [id number] DOB/AGE: 61-Oct-1957 61 y.o.  Admit date: 09/17/2018 Discharge date: 09/20/2018  Admission Diagnoses: Sigmoid colon carcinoma  Discharge Diagnoses: Same, liver mass Active Problems:   Malignant neoplasm of sigmoid colon (HCC)   Liver mass, right lobe   S/P partial colectomy Insulin-dependent diabetes mellitus, hypertension  Discharged Condition: good  Hospital Course: Patient is a 62 year old white male who was found on colonoscopy to have a sigmoid colon carcinoma.  He underwent a partial colectomy with wedge resection of liver mass on 09/17/2018.  Tolerated the procedure well.  His postoperative course was remarkable only for variable blood sugars which were managed with insulin.  Once his bowel function returned, his diet was advanced and his oral hypoglycemics were resumed.  Final pathology is pending.  Patient is being discharged home on 09/20/2018 in good and improving condition.  Treatments: surgery: Partial colectomy, wedge resection of liver mass on 09/17/2018  Discharge Exam: Blood pressure (!) 172/85, pulse 85, temperature 98.3 F (36.8 C), temperature source Oral, resp. rate 18, SpO2 96 %. General appearance: alert, cooperative and no distress Resp: clear to auscultation bilaterally Cardio: regular rate and rhythm, S1, S2 normal, no murmur, click, rub or gallop GI: Soft, incision healing well.  Bowel sounds active.  Disposition: Discharge disposition: 01-Home or Self Care       Discharge Instructions    Diet Carb Modified   Complete by:  As directed    Increase activity slowly   Complete by:  As directed      Allergies as of 09/20/2018      Reactions   Anectine [succinylcholine] Other (See Comments)   Atypical pseudocholinesterase deficiency.       Medication List    TAKE these medications   CoQ10 100 MG Caps Take 100 mg by mouth daily.   diltiazem 240 MG 24 hr capsule Commonly  known as:  CARDIZEM CD Take 240 mg by mouth daily.   diphenhydramine-acetaminophen 25-500 MG Tabs tablet Commonly known as:  TYLENOL PM Take 2 tablets by mouth at bedtime.   ezetimibe-simvastatin 10-80 MG tablet Commonly known as:  VYTORIN Take 1 tablet by mouth at bedtime.   FISH OIL PO Take 1 capsule by mouth daily.   hydrochlorothiazide 12.5 MG capsule Commonly known as:  MICROZIDE Take 12.5 mg by mouth daily.   insulin lispro protamine-lispro (75-25) 100 UNIT/ML Susp injection Commonly known as:  HUMALOG 75/25 MIX Inject 16 Units into the skin 3 (three) times daily.   INVOKANA 300 MG Tabs tablet Generic drug:  canagliflozin Take 300 mg by mouth daily.   LANTUS SOLOSTAR 100 UNIT/ML injection Generic drug:  insulin glargine Inject 20 Units into the skin at bedtime.   metFORMIN 1000 MG tablet Commonly known as:  GLUCOPHAGE Take 1,000 mg by mouth 2 (two) times daily.   metoprolol succinate 50 MG 24 hr tablet Commonly known as:  TOPROL-XL Take 75 mg by mouth 2 (two) times daily.   niacin 500 MG CR tablet Commonly known as:  NIASPAN Take 500 mg by mouth at bedtime.   oxyCODONE-acetaminophen 7.5-325 MG tablet Commonly known as:  PERCOCET Take 1-2 tablets by mouth every 6 (six) hours as needed.   pantoprazole 40 MG tablet Commonly known as:  PROTONIX Take 40 mg by mouth daily.   ramipril 10 MG capsule Commonly known as:  ALTACE Take 10 mg by mouth daily.   TRADJENTA 5 MG Tabs tablet Generic drug:  linagliptin Take 5 mg  by mouth daily.      Follow-up Information    Aviva Signs, MD. Schedule an appointment as soon as possible for a visit on 09/25/2018.   Specialty:  General Surgery Contact information: [id number] [address] [phone]           Signed: Aviva Signs 09/20/2018, 9:30 AM

## 2018-09-25 ENCOUNTER — Encounter: Payer: Self-pay | Admitting: General Surgery

## 2018-09-25 ENCOUNTER — Ambulatory Visit (INDEPENDENT_AMBULATORY_CARE_PROVIDER_SITE_OTHER): Payer: Self-pay | Admitting: General Surgery

## 2018-09-25 VITALS — BP 164/79 | HR 94 | Temp 97.7°F | Resp 18 | Wt 199.4 lb

## 2018-09-25 DIAGNOSIS — Z09 Encounter for follow-up examination after completed treatment for conditions other than malignant neoplasm: Secondary | ICD-10-CM

## 2018-09-25 NOTE — Progress Notes (Signed)
Subjective:     Ian [name]  Patient is here for surgery follow-up.  Patient denies any nausea, vomiting, significant incisional pain, or constipation.  His blood sugars are well controlled now. Objective:    BP (!) 164/79 (BP Location: Left Arm, Patient Position: Sitting, Cuff Size: Normal)   Pulse 94   Temp 97.7 F (36.5 C) (Temporal)   Resp 18   Wt 199 lb 6.4 oz (90.4 kg)   BMI 36.47 kg/m   General:  alert, cooperative and no distress  Abdomen soft.  Incision healing well.  Staples removed, Steri-Strips applied.  5 mm superficial wound separation at the most inferior aspect of the incision.  No evidence of infection noted. Final pathology reviewed with patient.  He does have metastatic disease to the liver, which was resected as a wedge.  1 out of 12 lymph nodes positive.  3 metastatic deposits in the mesentery.     Assessment:    Doing well postoperatively. Metastatic colon cancer to liver    Plan:   Patient instructed to keep his wound clean and dry.  Referral has been made to oncology for further evaluation and treatment.  Follow-up here as needed.  I did discuss possible Port-A-Cath placement.

## 2018-09-25 NOTE — Patient Instructions (Signed)
Implanted Port Insertion  Implanted port insertion is a procedure to put in a port and catheter. The port is a device with an injectable disk that can be accessed by your health care provider. The port is connected to a vein in the chest or neck by a small flexible tube (catheter). There are different types of ports. The implanted port may be used as a long-term IV access for:  · Medicines, such as chemotherapy.  · Fluids.  · Liquid nutrition, such as total parenteral nutrition (TPN).  · Blood samples.    Having a port means that your health care provider will not need to use the veins in your arms for these procedures.  Tell a health care provider about:  · Any allergies you have.  · All medicines you are taking, especially blood thinners, as well as any vitamins, herbs, eye drops, creams, over-the-counter medicines, and steroids.  · Any problems you or family members have had with anesthetic medicines.  · Any blood disorders you have.  · Any surgeries you have had.  · Any medical conditions you have, including diabetes or kidney problems.  · Whether you are pregnant or may be pregnant.  What are the risks?  Generally, this is a safe procedure. However, problems may occur, including:  · Allergic reactions to medicines or dyes.  · Damage to other structures or organs.  · Infection.  · Damage to the blood vessel, bruising, or bleeding at the puncture site.  · Blood clot.  · Breakdown of the skin over the port.  · A collection of air in the chest that can cause one of the lungs to collapse (pneumothorax). This is rare.    What happens before the procedure?  Staying hydrated  Follow instructions from your health care provider about hydration, which may include:  · Up to 2 hours before the procedure - you may continue to drink clear liquids, such as water, clear fruit juice, black coffee, and plain tea.    Eating and drinking restrictions  · Follow instructions from your health care provider about eating and drinking,  which may include:  ? 8 hours before the procedure - stop eating heavy meals or foods such as meat, fried foods, or fatty foods.  ? 6 hours before the procedure - stop eating light meals or foods, such as toast or cereal.  ? 6 hours before the procedure - stop drinking milk or drinks that contain milk.  ? 2 hours before the procedure - stop drinking clear liquids.  Medicines  · Ask your health care provider about:  ? Changing or stopping your regular medicines. This is especially important if you are taking diabetes medicines or blood thinners.  ? Taking medicines such as aspirin and ibuprofen. These medicines can thin your blood. Do not take these medicines before your procedure if your health care provider instructs you not to.  · You may be given antibiotic medicine to help prevent infection.  General instructions  · Plan to have someone take you home from the hospital or clinic.  · If you will be going home right after the procedure, plan to have someone with you for 24 hours.  · You may have blood tests.  · You may be asked to shower with a germ-killing soap.  What happens during the procedure?  · To lower your risk of infection:  ? Your health care team will wash or sanitize their hands.  ? Your skin will be washed with   soap.  ? Hair may be removed from the surgical area.  · An IV tube will be inserted into one of your veins.  · You will be given one or more of the following:  ? A medicine to help you relax (sedative).  ? A medicine to numb the area (local anesthetic).  · Two small cuts (incisions) will be made to insert the port.  ? One incision will be made in your neck to get access to the vein where the catheter will lie.  ? The other incision will be made in the upper chest. This is where the port will lie.  · The procedure may be done using continuous X-ray (fluoroscopy) or other imaging tools for guidance.  · The port and catheter will be placed. There may be a small, raised area where the port  is.  · The port will be flushed with a salt solution (saline), and blood will be drawn to make sure that it is working correctly.  · The incisions will be closed.  · Bandages (dressings) may be placed over the incisions.  The procedure may vary among health care providers and hospitals.  What happens after the procedure?  · Your blood pressure, heart rate, breathing rate, and blood oxygen level will be monitored until the medicines you were given have worn off.  · Do not drive for 24 hours if you were given a sedative.  · You will be given a manufacturer's information card for the type of port that you have. Keep this with you.  · Your port will need to be flushed and checked as told by your health care provider, usually every few weeks.  · A chest X-ray will be done to:  ? Check the placement of the port.  ? Make sure there is no injury to your lung.  Summary  · Implanted port insertion is a procedure to put in a port and catheter.  · The implanted port is used as a long-term IV access.  · The port will need to be flushed and checked as told by your health care provider, usually every few weeks.  · Keep your manufacturer's information card with you at all times.  This information is not intended to replace advice given to you by your health care provider. Make sure you discuss any questions you have with your health care provider.  Document Released: 08/26/2013 Document Revised: 09/26/2016 Document Reviewed: 09/26/2016  Elsevier Interactive Patient Education © 2017 [employer]

## 2018-10-01 ENCOUNTER — Encounter (HOSPITAL_COMMUNITY): Payer: Self-pay | Admitting: *Deleted

## 2018-10-01 ENCOUNTER — Encounter (HOSPITAL_COMMUNITY): Payer: Self-pay | Admitting: Hematology

## 2018-10-01 ENCOUNTER — Other Ambulatory Visit: Payer: Self-pay

## 2018-10-01 ENCOUNTER — Inpatient Hospital Stay (HOSPITAL_COMMUNITY): Payer: 59 | Attending: Hematology | Admitting: Hematology

## 2018-10-01 VITALS — BP 139/63 | HR 96 | Temp 97.9°F | Resp 16 | Ht 63.0 in | Wt 200.3 lb

## 2018-10-01 DIAGNOSIS — C787 Secondary malignant neoplasm of liver and intrahepatic bile duct: Secondary | ICD-10-CM | POA: Insufficient documentation

## 2018-10-01 DIAGNOSIS — C187 Malignant neoplasm of sigmoid colon: Secondary | ICD-10-CM | POA: Diagnosis not present

## 2018-10-01 NOTE — Assessment & Plan Note (Signed)
1.  Metastatic sigmoid colon cancer to the liver: - Screening colonoscopy on 08/29/2018 showed two-parent related polyps in the sigmoid colon region measuring 12 to 15 mm in size, removed with a hot snare.  Pathology was consistent with invasive moderately differentiated adenocarcinoma arising in a tubular adenoma with tumor invading the submucosa with no lymphovascular invasion.  Cauterized resection margin is positive for tumor. -Preoperative CEA on 09/12/2018 was 46.5. - Sigmoid colon segmental resection on 09/17/2018 showing no residual carcinoma in the sigmoid colon, margins uninvolved, 3 tumor deposits present and 1/12 lymph nodes positive.  Liver biopsy was consistent with metastatic carcinoma. - He never had any history of bleeding per rectum or melena.  No family history of colorectal cancer.  Brother died of lymphoma. - We will order a PET CT scan to evaluate the extent of disease.  We will also order MRI of the liver to see if they are potentially resectable lesions. - We will send his tumor for foundation 1 testing. - I have talked to him about the need for a port placement for chemotherapy administration.  We will send him back to Dr. Arnoldo Morale.

## 2018-10-01 NOTE — Progress Notes (Signed)
[facility] NOTE  Patient Care Team: Sharilyn Sites, MD as PCP - General (Family Medicine) Danie Binder, MD as Consulting Physician (Gastroenterology)  CHIEF COMPLAINTS/PURPOSE OF CONSULTATION:  Newly diagnosed colon cancer.  HISTORY OF PRESENTING ILLNESS:  Ian [name] 62 y.o. male is seen in consultation today for further work-up and management of sigmoid colon cancer.  He went for screening colonoscopy on 08/29/2018 which showed 2 pegylated polyps in the sigmoid colon measuring 12 to 15 mm in size.  2 sessile polyps were also found in the ascending colon.  He underwent polypectomy.  Pathology of the sigmoid colon polyp showed invasive moderately differentiated adenocarcinoma arising in the tubular adenoma, tumor invading submucosa.  Patient denied any history of bleeding per rectum or melena.  He denies any new onset pains.  He did not lose any weight.  He was never smoker nondrinker.  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  Family history was significant for brother who died of lymphoma. He underwent segmental resection of the sigmoid colon tumor which did not show any residual carcinoma.  3 tumor deposits were present and 1/12 lymph nodes was positive for metastatic carcinoma.  Liver mass was also found during the surgery which was biopsied and was consistent with adenocarcinoma.  Patient has recovered well from surgery.  He is seen with his wife today.  MEDICAL HISTORY:  Past Medical History:  Diagnosis Date  . 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 Colon cancer ([place])   . Complication of anesthesia    atypical pseudo-cholinesterase deficiency  . Diabetes mellitus without complication ([place])   . Family history of adverse reaction to anesthesia    sister also has atypical pseudo-cholinesterase deficiency  . GERD (gastroesophageal reflux disease)   . History of heart bypass surgery   . Hypertension     SURGICAL HISTORY: Past  Surgical History:  Procedure Laterality Date  . COLONOSCOPY N/A 08/29/2018   Procedure: COLONOSCOPY;  Surgeon: Daneil Dolin, MD;  Location: [facility];  Service: Endoscopy;  Laterality: N/A;  9:30  . CORONARY ARTERY BYPASS GRAFT     4 vessels  . heart bypass    . INSERTION OF MESH  02/07/2015   Procedure: INSERTION OF MESH;  Surgeon: Aviva Signs Md, MD;  Location: [facility]S;  Service: General;;  . PARTIAL COLECTOMY N/A 09/17/2018   Procedure: PARTIAL COLECTOMY WITH PARTIAL WEDGE RESECTION LIVER METASTASIS;  Surgeon: Aviva Signs, MD;  Location: [facility];  Service: General;  Laterality: N/A;  . POLYPECTOMY  08/29/2018   Procedure: POLYPECTOMY;  Surgeon: Daneil Dolin, MD;  Location: [facility];  Service: Endoscopy;;  . UMBILICAL HERNIA REPAIR N/A 02/07/2015   Procedure: UMBILICAL HERNIORRHAPHY WITH MESH;  Surgeon: Aviva Signs Md, MD;  Location: [facility];  Service: General;  Laterality: N/A;    SOCIAL HISTORY: Social History   Socioeconomic History  . Marital status: Married    Spouse name: Not on file  . Number of children: 3  . Years of education: Not on file  . Highest education level: Not on file  Occupational History  . 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 Financial resource strain: Not hard at all  . Food insecurity:    Worry: Never true    Inability: Never true  . Transportation needs:    Medical: No    Non-medical: No  Tobacco Use  . Smoking status: Never Smoker  .  Smokeless tobacco: Never Used  Substance and Sexual Activity  . Alcohol use: No  . Drug use: No  . Sexual activity: Yes    Birth control/protection: None  Lifestyle  . Physical activity:    Days per week: 0 days    Minutes per session: 0 min  . Stress: Not at all  Relationships  . Social connections:    Talks on phone: More than three times a week    Gets together: More than three times a week    Attends religious service: Never    Active member of  club or organization: No    Attends meetings of clubs or organizations: Never    Relationship status: Married  . Intimate partner violence:    Fear of current or ex partner: No    Emotionally abused: No    Physically abused: No    Forced sexual activity: No  Other Topics Concern  . Not on file  Social History Narrative  . Not on file    FAMILY HISTORY: Family History  Problem Relation Age of Onset  . Hypertension Father   . Diabetes Father   . Heart disease Father   . Diabetes Sister   . Hypertension Sister   . Hypertension Brother   . Diabetes Brother   . Diabetes Sister   . Diabetes Brother   . Heart disease Brother   . Lymphoma Brother   . Hypertension Son     ALLERGIES:  is allergic to anectine [succinylcholine].  MEDICATIONS:  Current Outpatient Medications  Medication Sig Dispense Refill  . Coenzyme Q10 (COQ10) 100 MG CAPS Take 100 mg by mouth daily.    Marland Kitchen diltiazem (CARDIZEM CD) 240 MG 24 hr capsule Take 240 mg by mouth daily.    . diphenhydramine-acetaminophen (TYLENOL PM) 25-500 MG TABS tablet Take 2 tablets by mouth at bedtime.    Marland Kitchen ezetimibe-simvastatin (VYTORIN) 10-80 MG per tablet Take 1 tablet by mouth at bedtime.    . hydrochlorothiazide (MICROZIDE) 12.5 MG capsule Take 12.5 mg by mouth daily.    . insulin glargine (LANTUS SOLOSTAR) 100 UNIT/ML injection Inject 20 Units into the skin at bedtime.     . insulin lispro protamine-lispro (HUMALOG 75/25 MIX) (75-25) 100 UNIT/ML SUSP injection Inject 16 Units into the skin 3 (three) times daily.    . INVOKANA 300 MG TABS tablet Take 300 mg by mouth daily.    . metFORMIN (GLUCOPHAGE) 1000 MG tablet Take 1,000 mg by mouth 2 (two) times daily.     . metoprolol succinate (TOPROL-XL) 50 MG 24 hr tablet Take 75 mg by mouth 2 (two) times daily.    . niacin (NIASPAN) 500 MG CR tablet Take 500 mg by mouth at bedtime.    . Omega-3 Fatty Acids (FISH OIL PO) Take 1 capsule by mouth daily.    . pantoprazole (PROTONIX) 40 MG  tablet Take 40 mg by mouth daily.    . ramipril (ALTACE) 10 MG capsule Take 10 mg by mouth daily.    . TRADJENTA 5 MG TABS tablet Take 5 mg by mouth daily.      No current facility-administered medications for this visit.     REVIEW OF SYSTEMS:   Constitutional: Denies fevers, chills or abnormal night sweats Eyes: Denies blurriness of vision, double vision or watery eyes Ears, nose, mouth, throat, and face: Denies mucositis or sore throat Respiratory: Denies cough, dyspnea or wheezes Cardiovascular: Denies palpitation, chest discomfort or lower extremity swelling Gastrointestinal:  Denies nausea, heartburn  or change in bowel habits Skin: Denies abnormal skin rashes Lymphatics: Denies new lymphadenopathy or easy bruising Neurological:Denies numbness, tingling or new weaknesses Behavioral/Psych: Mood is stable, no new changes  All other systems were reviewed with the patient and are negative.  PHYSICAL EXAMINATION: ECOG PERFORMANCE STATUS: 1 - Symptomatic but completely ambulatory  Vitals:   10/01/18 1327 10/01/18 1338  BP: 139/63 139/63  Pulse: 96 96  Resp: 16 16  Temp: 97.9 F (36.6 C) 97.9 F (36.6 C)  SpO2: 99% 99%   Filed Weights   10/01/18 1327 10/01/18 1338  Weight: 200 lb 4.8 oz (90.9 kg) 200 lb 4.8 oz (90.9 kg)    GENERAL:alert, no distress and comfortable SKIN: skin color, texture, turgor are normal, no rashes or significant lesions EYES: normal, conjunctiva are pink and non-injected, sclera clear OROPHARYNX:no exudate, no erythema and lips, buccal mucosa, and tongue normal  NECK: supple, thyroid normal size, non-tender, without nodularity LYMPH:  no palpable lymphadenopathy in the cervical, axillary or inguinal LUNGS: clear to auscultation and percussion with normal breathing effort HEART: regular rate & rhythm and no murmurs and no lower extremity edema ABDOMEN: Midline abdominal scar is well-healed. Musculoskeletal:no cyanosis of digits and no clubbing   PSYCH: alert & oriented x 3 with fluent speech NEURO: no focal motor/sensory deficits  LABORATORY DATA:  I have reviewed the data as listed Lab Results  Component Value Date   WBC 11.3 (H) 09/19/2018   HGB 10.8 (L) 09/19/2018   HCT 34.4 (L) 09/19/2018   MCV 91.0 09/19/2018   PLT 237 09/19/2018     Chemistry      Component Value Date/Time   NA 139 09/19/2018 0418   K 3.7 09/19/2018 0418   CL 108 09/19/2018 0418   CO2 24 09/19/2018 0418   BUN 11 09/19/2018 0418   CREATININE 0.93 09/19/2018 0418      Component Value Date/Time   CALCIUM 8.5 (L) 09/19/2018 0418   ALKPHOS 73 09/12/2018 1419   AST 21 09/12/2018 1419   ALT 22 09/12/2018 1419   BILITOT 0.7 09/12/2018 1419       RADIOGRAPHIC STUDIES: I have personally reviewed the radiological images as listed and agreed with the findings in the report. Chest 2 View  Result Date: 09/12/2018 CLINICAL DATA:  Preoperative evaluation, colon cancer. EXAM: CHEST - 2 VIEW COMPARISON:  Chest radiograph Apr 09, 2013 FINDINGS: Cardiomediastinal silhouette is normal. Calcified aortic arch. Status post median sternotomy for CABG. No pleural effusions or focal consolidations. Trachea projects midline and there is no pneumothorax. Soft tissue planes and included osseous structures are non-suspicious. IMPRESSION: 1. No acute cardiopulmonary process. 2.  Aortic Atherosclerosis (ICD10-I70.0). Electronically Signed   By: Elon Alas M.D.   On: 09/12/2018 23:43    ASSESSMENT & PLAN:  Malignant neoplasm of sigmoid colon ([place]) 1.  Metastatic sigmoid colon cancer to the liver: - Screening colonoscopy on 08/29/2018 showed two-parent related polyps in the sigmoid colon region measuring 12 to 15 mm in size, removed with a hot snare.  Pathology was consistent with invasive moderately differentiated adenocarcinoma arising in a tubular adenoma with tumor invading the submucosa with no lymphovascular invasion.  Cauterized resection margin is positive  for tumor. -Preoperative CEA on 09/12/2018 was 46.5. - Sigmoid colon segmental resection on 09/17/2018 showing no residual carcinoma in the sigmoid colon, margins uninvolved, 3 tumor deposits present and 1/12 lymph nodes positive.  Liver biopsy was consistent with metastatic carcinoma. - He never had any history of bleeding per rectum  or melena.  No family history of colorectal cancer.  Brother died of lymphoma. - We will order a PET CT scan to evaluate the extent of disease.  We will also order MRI of the liver to see if they are potentially resectable lesions. - We will send his tumor for foundation 1 testing. - I have talked to him about the need for a port placement for chemotherapy administration.  We will send him back to Dr. Arnoldo Morale.  Orders Placed This Encounter  Procedures  . NM PET Image Initial (PI) Skull Base To Thigh    Standing Status:   Future    Standing Expiration Date:   10/01/2019    Order Specific Question:   ** REASON FOR EXAM (FREE TEXT)    Answer:   colon cancer staging    Order Specific Question:   If indicated for the ordered procedure, I authorize the administration of a radiopharmaceutical per Radiology protocol    Answer:   Yes    Order Specific Question:   Preferred imaging location?    Answer:   [facility]    Order Specific Question:   Radiology Contrast Protocol - do NOT remove file path    Answer:   [email or web address]  . MR Abdomen W Wo Contrast    Standing Status:   Future    Standing Expiration Date:   10/01/2019    Order Specific Question:   ** REASON FOR EXAM (FREE TEXT)    Answer:   colon cancer    Order Specific Question:   If indicated for the ordered procedure, I authorize the administration of contrast media per Radiology protocol    Answer:   Yes    Order Specific Question:   What is the patient's sedation requirement?    Answer:   No Sedation    Order Specific Question:   Does the patient have a pacemaker or  implanted devices?    Answer:   No    Order Specific Question:   Radiology Contrast Protocol - do NOT remove file path    Answer:   [email or web address]    Order Specific Question:   Preferred imaging location?    Answer:   [facility] (table limit-350lbs)    All questions were answered. The patient knows to call the clinic with any problems, questions or concerns.      Derek Jack, MD 10/01/2018 4:58 PM

## 2018-10-01 NOTE — Progress Notes (Signed)
I spoke with Suanne Marker in pathology and ordered foundation one CDX. Dx code C18.7 and Stage IV.

## 2018-10-02 NOTE — H&P (Signed)
Ian [name]; [id number]; Apr 11, 1956   HPI Patient is a 62 year old white male who was referred to my care by Dr. Gala Romney for evaluation treatment of colon cancer.  Patient recently underwent a colonoscopy and a polyp in the sigmoid colon region showed invasive adenocarcinoma in the stalk.  It was at the margin of the polypectomy.  Other polyps in the colon were negative for malignancy.  Patient denies any abdominal pain.  This was found on routine screening for colonoscopy.  Patient denies any family history of colon cancer.  He denies any blood per rectum.  Patient recently underwent a partial colectomy with wedge resection of the liver lesion was found to have metastatic colon cancer.  He is about to undergo chemotherapy and needs central venous access. Past Medical History:  Diagnosis Date  . 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 Complication of anesthesia    atypical pseudo-cholinesterase deficiency  . Diabetes mellitus without complication ([place])   . Family history of adverse reaction to anesthesia    sister also has atypical pseudo-cholinesterase deficiency  . GERD (gastroesophageal reflux disease)   . History of heart bypass surgery   . Hypertension     Past Surgical History:  Procedure Laterality Date  . COLONOSCOPY N/A 08/29/2018   Procedure: COLONOSCOPY;  Surgeon: Daneil Dolin, MD;  Location: [facility];  Service: Endoscopy;  Laterality: N/A;  9:30  . CORONARY ARTERY BYPASS GRAFT     4 vessels  . heart bypass    . INSERTION OF MESH  02/07/2015   Procedure: INSERTION OF MESH;  Surgeon: Aviva Signs Md, MD;  Location: [facility];  Service: General;;  . POLYPECTOMY  08/29/2018   Procedure: POLYPECTOMY;  Surgeon: Daneil Dolin, MD;  Location: [facility];  Service: Endoscopy;;  . UMBILICAL HERNIA REPAIR N/A 02/07/2015   Procedure: UMBILICAL HERNIORRHAPHY WITH MESH;  Surgeon: Aviva Signs Md, MD;  Location: [facility];  Service: General;  Laterality: N/A;    Family History   Problem Relation Age of Onset  . Hypertension Father   . Diabetes Father   . Hypertension Sister   . Diabetes Sister   . Hypertension Brother   . Diabetes Brother     Current Outpatient Medications on File Prior to Visit  Medication Sig Dispense Refill  . Coenzyme Q10 (COQ10) 100 MG CAPS Take 100 mg by mouth daily.    Marland Kitchen diltiazem (CARDIZEM CD) 240 MG 24 hr capsule Take 240 mg by mouth daily.    . diphenhydramine-acetaminophen (TYLENOL PM) 25-500 MG TABS tablet Take 2 tablets by mouth at bedtime.    Marland Kitchen ezetimibe-simvastatin (VYTORIN) 10-80 MG per tablet Take 1 tablet by mouth at bedtime.    . hydrochlorothiazide (MICROZIDE) 12.5 MG capsule Take 12.5 mg by mouth daily.    . insulin glargine (LANTUS SOLOSTAR) 100 UNIT/ML injection Inject 20 Units into the skin at bedtime.     . insulin lispro protamine-lispro (HUMALOG 75/25 MIX) (75-25) 100 UNIT/ML SUSP injection Inject 16 Units into the skin 3 (three) times daily.    . INVOKANA 300 MG TABS tablet Take 300 mg by mouth daily.    . metFORMIN (GLUCOPHAGE) 1000 MG tablet Take 1,000 mg by mouth 2 (two) times daily.     . metoprolol succinate (TOPROL-XL) 50 MG 24 hr tablet Take 75 mg by mouth 2 (two) times daily.    . niacin (NIASPAN) 500 MG CR tablet Take 500 mg by mouth at bedtime.    . Omega-3 Fatty Acids (FISH  OIL PO) Take 1 capsule by mouth daily.    . pantoprazole (PROTONIX) 40 MG tablet Take 40 mg by mouth daily.    . ramipril (ALTACE) 10 MG capsule Take 10 mg by mouth daily.    . TRADJENTA 5 MG TABS tablet Take 5 mg by mouth daily.      No current facility-administered medications on file prior to visit.     Allergies  Allergen Reactions  . Anectine [Succinylcholine] Other (See Comments)    Atypical pseudocholinesterase deficiency.     Social History   Substance and Sexual Activity  Alcohol Use No    Social History   Tobacco Use  Smoking Status Never Smoker  Smokeless Tobacco Never Used    Review of Systems   Constitutional: Negative.   HENT: Negative.   Eyes: Negative.   Respiratory: Negative.   Cardiovascular: Negative.   Gastrointestinal: Negative.   Genitourinary: Negative.   Musculoskeletal: Negative.   Skin: Negative.   Neurological: Negative.   Endo/Heme/Allergies: Negative.   Psychiatric/Behavioral: Negative.     Objective   Vitals:   09/09/18 1406  BP: (!) 169/74  Pulse: 84  Resp: 20  Temp: 98.6 F (37 C)    Physical Exam  Constitutional: He is oriented to person, place, and time. He appears well-developed and well-nourished. No distress.  HENT:  Head: Normocephalic and atraumatic.  Cardiovascular: Normal rate, regular rhythm and normal heart sounds. Exam reveals no gallop and no friction rub.  No murmur heard. Pulmonary/Chest: Effort normal and breath sounds normal. No stridor. No respiratory distress. He has no wheezes. He has no rales.  Abdominal: Soft. Bowel sounds are normal. He exhibits no distension and no mass. There is no tenderness. There is no guarding. No hernia.  Neurological: He is alert and oriented to person, place, and time.  Skin: Skin is warm and dry.  Vitals reviewed.  Dr. Roseanne Kaufman notes and pathology reports reviewed.  Assessment  Sigmoid colon carcinoma Plan   Patient is scheduled for Port-A-Cath insertion on 10/10/2018.  The risks and benefits of the procedure including bleeding, infection, and pneumothorax were fully explained to the patient, who gave informed consent.

## 2018-10-06 ENCOUNTER — Encounter (HOSPITAL_COMMUNITY)
Admission: RE | Admit: 2018-10-06 | Discharge: 2018-10-06 | Disposition: A | Discharge status: Home / Self Care | Payer: 59 | Source: Ambulatory Visit | Attending: General Surgery | Admitting: General Surgery

## 2018-10-07 ENCOUNTER — Ambulatory Visit (HOSPITAL_COMMUNITY): Payer: 59

## 2018-10-07 ENCOUNTER — Other Ambulatory Visit (HOSPITAL_COMMUNITY): Payer: Self-pay | Admitting: Nurse Practitioner

## 2018-10-07 ENCOUNTER — Encounter (HOSPITAL_COMMUNITY): Payer: Self-pay

## 2018-10-07 DIAGNOSIS — C187 Malignant neoplasm of sigmoid colon: Secondary | ICD-10-CM

## 2018-10-09 ENCOUNTER — Ambulatory Visit (HOSPITAL_COMMUNITY): Payer: 59

## 2018-10-10 ENCOUNTER — Ambulatory Visit (HOSPITAL_COMMUNITY): Payer: 59

## 2018-10-10 ENCOUNTER — Encounter (HOSPITAL_COMMUNITY): Payer: Self-pay | Admitting: *Deleted

## 2018-10-10 ENCOUNTER — Ambulatory Visit (HOSPITAL_COMMUNITY): Payer: 59 | Admitting: Certified Registered Nurse Anesthetist

## 2018-10-10 ENCOUNTER — Encounter (HOSPITAL_COMMUNITY)
Admission: RE | Disposition: A | Discharge status: Home / Self Care | Payer: Self-pay | Source: Ambulatory Visit | Attending: General Surgery

## 2018-10-10 ENCOUNTER — Ambulatory Visit (HOSPITAL_COMMUNITY)
Admission: RE | Admit: 2018-10-10 | Discharge: 2018-10-10 | Disposition: A | Discharge status: Home / Self Care | Payer: 59 | Source: Ambulatory Visit | Attending: General Surgery | Admitting: General Surgery

## 2018-10-10 DIAGNOSIS — E119 Type 2 diabetes mellitus without complications: Secondary | ICD-10-CM | POA: Insufficient documentation

## 2018-10-10 DIAGNOSIS — K219 Gastro-esophageal reflux disease without esophagitis: Secondary | ICD-10-CM | POA: Insufficient documentation

## 2018-10-10 DIAGNOSIS — I2510 Atherosclerotic heart disease of native coronary artery without angina pectoris: Secondary | ICD-10-CM | POA: Insufficient documentation

## 2018-10-10 DIAGNOSIS — I10 Essential (primary) hypertension: Secondary | ICD-10-CM | POA: Diagnosis not present

## 2018-10-10 DIAGNOSIS — C187 Malignant neoplasm of sigmoid colon: Secondary | ICD-10-CM | POA: Insufficient documentation

## 2018-10-10 DIAGNOSIS — Z794 Long term (current) use of insulin: Secondary | ICD-10-CM | POA: Diagnosis not present

## 2018-10-10 DIAGNOSIS — Z95828 Presence of other vascular implants and grafts: Secondary | ICD-10-CM

## 2018-10-10 DIAGNOSIS — Z9049 Acquired absence of other specified parts of digestive tract: Secondary | ICD-10-CM | POA: Diagnosis not present

## 2018-10-10 DIAGNOSIS — Z951 Presence of aortocoronary bypass graft: Secondary | ICD-10-CM | POA: Insufficient documentation

## 2018-10-10 DIAGNOSIS — Z79899 Other long term (current) drug therapy: Secondary | ICD-10-CM | POA: Diagnosis not present

## 2018-10-10 DIAGNOSIS — C787 Secondary malignant neoplasm of liver and intrahepatic bile duct: Secondary | ICD-10-CM | POA: Diagnosis not present

## 2018-10-10 HISTORY — PX: PORTACATH PLACEMENT: SHX2246

## 2018-10-10 LAB — GLUCOSE, CAPILLARY: GLUCOSE-CAPILLARY: 157 mg/dL — AB (ref 70–99)

## 2018-10-10 SURGERY — INSERTION, TUNNELED CENTRAL VENOUS DEVICE, WITH PORT
Anesthesia: Monitor Anesthesia Care | Site: Chest | Laterality: Left

## 2018-10-10 MED ORDER — LACTATED RINGERS IV SOLN
INTRAVENOUS | Status: DC
  Administered 2018-10-10: 1000 mL via INTRAVENOUS

## 2018-10-10 MED ORDER — PROMETHAZINE HCL 25 MG/ML IJ SOLN: 6.2500 mg | INTRAMUSCULAR | Status: DC | PRN

## 2018-10-10 MED ORDER — PROPOFOL 500 MG/50ML IV EMUL
INTRAVENOUS | Status: DC | PRN
  Administered 2018-10-10: 100 ug/kg/min via INTRAVENOUS

## 2018-10-10 MED ORDER — HYDROMORPHONE HCL 1 MG/ML IJ SOLN: 0.2500 mg | INTRAMUSCULAR | Status: DC | PRN

## 2018-10-10 MED ORDER — FENTANYL CITRATE (PF) 100 MCG/2ML IJ SOLN
INTRAMUSCULAR | Status: DC | PRN
  Administered 2018-10-10: 50 ug via INTRAVENOUS

## 2018-10-10 MED ORDER — LIDOCAINE HCL (PF) 1 % IJ SOLN
INTRAMUSCULAR | Status: DC | PRN
  Administered 2018-10-10: 9 mL

## 2018-10-10 MED ORDER — MIDAZOLAM HCL 2 MG/2ML IJ SOLN: 0.5000 mg | Freq: Once | INTRAMUSCULAR | Status: DC | PRN

## 2018-10-10 MED ORDER — HEPARIN SOD (PORK) LOCK FLUSH 100 UNIT/ML IV SOLN
INTRAVENOUS | Status: DC | PRN
  Administered 2018-10-10: 500 [IU] via INTRAVENOUS

## 2018-10-10 MED ORDER — SODIUM CHLORIDE (PF) 0.9 % IJ SOLN
INTRAMUSCULAR | Status: DC | PRN
  Administered 2018-10-10: 500 mL via INTRAVENOUS

## 2018-10-10 MED ORDER — LIDOCAINE HCL (PF) 1 % IJ SOLN
INTRAMUSCULAR | Status: AC
  Filled 2018-10-10: qty 30

## 2018-10-10 MED ORDER — FENTANYL CITRATE (PF) 100 MCG/2ML IJ SOLN
INTRAMUSCULAR | Status: AC
  Filled 2018-10-10: qty 2

## 2018-10-10 MED ORDER — HEPARIN SOD (PORK) LOCK FLUSH 100 UNIT/ML IV SOLN
INTRAVENOUS | Status: AC
  Filled 2018-10-10: qty 5

## 2018-10-10 MED ORDER — PROPOFOL 10 MG/ML IV BOLUS
INTRAVENOUS | Status: DC | PRN
  Administered 2018-10-10: 30 mg via INTRAVENOUS

## 2018-10-10 MED ORDER — CEFAZOLIN SODIUM-DEXTROSE 2-4 GM/100ML-% IV SOLN
2.0000 g | INTRAVENOUS | Status: AC
  Administered 2018-10-10: 2 g via INTRAVENOUS

## 2018-10-10 MED ORDER — CHLORHEXIDINE GLUCONATE CLOTH 2 % EX PADS: 6.0000 | MEDICATED_PAD | Freq: Once | CUTANEOUS | Status: DC

## 2018-10-10 MED ORDER — MIDAZOLAM HCL 2 MG/2ML IJ SOLN
INTRAMUSCULAR | Status: AC
  Filled 2018-10-10: qty 2

## 2018-10-10 MED ORDER — MIDAZOLAM HCL 5 MG/5ML IJ SOLN
INTRAMUSCULAR | Status: DC | PRN
  Administered 2018-10-10: 2 mg via INTRAVENOUS

## 2018-10-10 MED ORDER — CEFAZOLIN SODIUM-DEXTROSE 2-4 GM/100ML-% IV SOLN
INTRAVENOUS | Status: AC
  Filled 2018-10-10: qty 100

## 2018-10-10 MED ORDER — HYDROCODONE-ACETAMINOPHEN 7.5-325 MG PO TABS: 1.0000 | ORAL_TABLET | Freq: Once | ORAL | Status: DC | PRN

## 2018-10-10 MED ORDER — PROPOFOL 10 MG/ML IV BOLUS
INTRAVENOUS | Status: AC
  Filled 2018-10-10: qty 20

## 2018-10-10 MED ORDER — KETOROLAC TROMETHAMINE 30 MG/ML IJ SOLN
30.0000 mg | Freq: Once | INTRAMUSCULAR | Status: AC
  Administered 2018-10-10: 30 mg via INTRAVENOUS
  Filled 2018-10-10: qty 1

## 2018-10-10 SURGICAL SUPPLY — 31 items
ADH SKN CLS APL DERMABOND .7 (GAUZE/BANDAGES/DRESSINGS) ×1
BAG DECANTER FOR FLEXI CONT (MISCELLANEOUS) ×2 IMPLANT
CHLORAPREP W/TINT 10.5 ML (MISCELLANEOUS) ×2 IMPLANT
CLOTH BEACON ORANGE TIMEOUT ST (SAFETY) ×2 IMPLANT
COVER LIGHT HANDLE STERIS (MISCELLANEOUS) ×4 IMPLANT
DECANTER SPIKE VIAL GLASS SM (MISCELLANEOUS) ×2 IMPLANT
DERMABOND ADVANCED (GAUZE/BANDAGES/DRESSINGS) ×1
DERMABOND ADVANCED .7 DNX12 (GAUZE/BANDAGES/DRESSINGS) ×1 IMPLANT
DRAPE C-ARM FOLDED MOBILE STRL (DRAPES) ×2 IMPLANT
ELECT REM PT RETURN 9FT ADLT (ELECTROSURGICAL) ×2
ELECTRODE REM PT RTRN 9FT ADLT (ELECTROSURGICAL) ×1 IMPLANT
GLOVE BIOGEL PI IND STRL 7.0 (GLOVE) ×2 IMPLANT
GLOVE BIOGEL PI INDICATOR 7.0 (GLOVE) ×2
GLOVE ECLIPSE 6.5 STRL STRAW (GLOVE) ×1 IMPLANT
GLOVE SURG SS PI 7.5 STRL IVOR (GLOVE) ×2 IMPLANT
GOWN STRL REUS W/TWL LRG LVL3 (GOWN DISPOSABLE) ×4 IMPLANT
IV NS 500ML (IV SOLUTION) ×2
IV NS 500ML BAXH (IV SOLUTION) ×1 IMPLANT
KIT PORT POWER 8FR ISP MRI (Port) ×2 IMPLANT
KIT TURNOVER KIT A (KITS) ×2 IMPLANT
NDL HYPO 25X1 1.5 SAFETY (NEEDLE) ×1 IMPLANT
NEEDLE HYPO 25X1 1.5 SAFETY (NEEDLE) ×2 IMPLANT
PACK MINOR (CUSTOM PROCEDURE TRAY) ×2 IMPLANT
PAD ARMBOARD 7.5X6 YLW CONV (MISCELLANEOUS) ×2 IMPLANT
SET BASIN LINEN APH (SET/KITS/TRAYS/PACK) ×2 IMPLANT
SUT MNCRL AB 4-0 PS2 18 (SUTURE) ×2 IMPLANT
SUT VIC AB 3-0 SH 27 (SUTURE) ×2
SUT VIC AB 3-0 SH 27X BRD (SUTURE) ×1 IMPLANT
SYR 20CC LL (SYRINGE) ×2 IMPLANT
SYR 5ML LL (SYRINGE) ×2 IMPLANT
SYR CONTROL 10ML LL (SYRINGE) ×2 IMPLANT

## 2018-10-10 NOTE — Op Note (Signed)
Patient:  Ian [name]  DOB:  1956/11/06  MRN:  [id number]   Preop Diagnosis: Colon carcinoma, need for central venous access  Postop Diagnosis: Same  Procedure: Port-A-Cath insertion  Surgeon: Aviva Signs, MD  Anes: MAC  Indications: Patient is a 62 year old white male who is about to undergo chemotherapy for colon cancer.  The risks and benefits of the procedure including bleeding, infection, and pneumothorax were fully explained to the patient, who gave informed consent.  Procedure note: The patient was placed in the Trendelenburg position after the left upper chest was prepped and draped using the usual sterile technique with DuraPrep.  Surgical site confirmation was performed.  1% Xylocaine was used for local anesthesia.  An incision was made below the left clavicle.  A subcutaneous pocket was formed.  A needle was advanced into the left subclavian vein using the Seldinger technique without difficulty.  A guidewire was then advanced into the right atrium under fluoroscopic guidance.  An introducer and peel-away sheath were placed over the guidewire.  Catheter was then inserted through the peel-away sheath and the peel-away sheath was removed.  The catheter was then attached to the port and the port placed in subcutaneous pocket.  Adequate positioning was confirmed by fluoroscopy.  Backflow blood was noted on aspiration of the port.  Port was flushed with heparin flush.  Subcutaneous layer was reapproximated using a 3-0 Vicryl interrupted suture.  The skin was closed using a 4-0 Monocryl subcuticular suture.  Dermabond was applied.  All tape and needle counts were correct at the end of the procedure.  The patient was awakened and transferred to PACU in stable condition.  A chest x-ray will be performed at that time.   Complications: None  EBL: Minimal  Specimen: None

## 2018-10-10 NOTE — Discharge Instructions (Signed)
Implanted Port Home Guide °An implanted port is a type of central line that is placed under the skin. Central lines are used to provide IV access when treatment or nutrition needs to be given through a person’s veins. Implanted ports are used for long-term IV access. An implanted port may be placed because: °· You need IV medicine that would be irritating to the small veins in your hands or arms. °· You need long-term IV medicines, such as antibiotics. °· You need IV nutrition for a long period. °· You need frequent blood draws for lab tests. °· You need dialysis. ° °Implanted ports are usually placed in the chest area, but they can also be placed in the upper arm, the abdomen, or the leg. An implanted port has two main parts: °· Reservoir. The reservoir is round and will appear as a small, raised area under your skin. The reservoir is the part where a needle is inserted to give medicines or draw blood. °· Catheter. The catheter is a thin, flexible tube that extends from the reservoir. The catheter is placed into a large vein. Medicine that is inserted into the reservoir goes into the catheter and then into the vein. ° °How will I care for my incision site? °Do not get the incision site wet. Bathe or shower as directed by your health care provider. °How is my port accessed? °Special steps must be taken to access the port: °· Before the port is accessed, a numbing cream can be placed on the skin. This helps numb the skin over the port site. °· Your health care provider uses a sterile technique to access the port. °? Your health care provider must put on a mask and sterile gloves. °? The skin over your port is cleaned carefully with an antiseptic and allowed to dry. °? The port is gently pinched between sterile gloves, and a needle is inserted into the port. °· Only "non-coring" port needles should be used to access the port. Once the port is accessed, a blood return should be checked. This helps ensure that the port  is in the vein and is not clogged. °· If your port needs to remain accessed for a constant infusion, a clear (transparent) bandage will be placed over the needle site. The bandage and needle will need to be changed every week, or as directed by your health care provider. °· Keep the bandage covering the needle clean and dry. Do not get it wet. Follow your health care provider’s instructions on how to take a shower or bath while the port is accessed. °· If your port does not need to stay accessed, no bandage is needed over the port. ° °What is flushing? °Flushing helps keep the port from getting clogged. Follow your health care provider’s instructions on how and when to flush the port. Ports are usually flushed with saline solution or a medicine called heparin. The need for flushing will depend on how the port is used. °· If the port is used for intermittent medicines or blood draws, the port will need to be flushed: °? After medicines have been given. °? After blood has been drawn. °? As part of routine maintenance. °· If a constant infusion is running, the port may not need to be flushed. ° °How long will my port stay implanted? °The port can stay in for as long as your health care provider thinks it is needed. When it is time for the port to come out, surgery will be   done to remove it. The procedure is similar to the one performed when the port was put in. °When should I seek immediate medical care? °When you have an implanted port, you should seek immediate medical care if: °· You notice a bad smell coming from the incision site. °· You have swelling, redness, or drainage at the incision site. °· You have more swelling or pain at the port site or the surrounding area. °· You have a fever that is not controlled with medicine. ° °This information is not intended to replace advice given to you by your health care provider. Make sure you discuss any questions you have with your health care provider. °Document  Released: 11/05/2005 Document Revised: 04/12/2016 Document Reviewed: 07/13/2013 °Elsevier Interactive Patient Education © 2017 [employer] °Implanted Port Insertion, Care After °This sheet gives you information about how to care for yourself after your procedure. Your health care provider may also give you more specific instructions. If you have problems or questions, contact your health care provider. °What can I expect after the procedure? °After your procedure, it is common to have: °· Discomfort at the port insertion site. °· Bruising on the skin over the port. This should improve over 3-4 days. ° °Follow these instructions at home: °Port care °· After your port is placed, you will get a manufacturer's information card. The card has information about your port. Keep this card with you at all times. °· Take care of the port as told by your health care provider. Ask your health care provider if you or a family member can get training for taking care of the port at home. A home health care nurse may also take care of the port. °· Make sure to remember what type of port you have. °Incision care °· Follow instructions from your health care provider about how to take care of your port insertion site. Make sure you: °? Wash your hands with soap and water before you change your bandage (dressing). If soap and water are not available, use hand sanitizer. °? Change your dressing as told by your health care provider. °? Leave stitches (sutures), skin glue, or adhesive strips in place. These skin closures may need to stay in place for 2 weeks or longer. If adhesive strip edges start to loosen and curl up, you may trim the loose edges. Do not remove adhesive strips completely unless your health care provider tells you to do that. °· Check your port insertion site every day for signs of infection. Check for: °? More redness, swelling, or pain. °? More fluid or blood. °? Warmth. °? Pus or a bad smell. °General  instructions °· Do not take baths, swim, or use a hot tub until your health care provider approves. °· Do not lift anything that is heavier than 10 lb (4.5 kg) for a week, or as told by your health care provider. °· Ask your health care provider when it is okay to: °? Return to work or school. °? Resume usual physical activities or sports. °· Do not drive for 24 hours if you were given a medicine to help you relax (sedative). °· Take over-the-counter and prescription medicines only as told by your health care provider. °· Wear a medical alert bracelet in case of an emergency. This will tell any health care providers that you have a port. °· Keep all follow-up visits as told by your health care provider. This is important. °Contact a health care provider if: °· You cannot   flush your port with saline as directed, or you cannot draw blood from the port. °· You have a fever or chills. °· You have more redness, swelling, or pain around your port insertion site. °· You have more fluid or blood coming from your port insertion site. °· Your port insertion site feels warm to the touch. °· You have pus or a bad smell coming from the port insertion site. °Get help right away if: °· You have chest pain or shortness of breath. °· You have bleeding from your port that you cannot control. °Summary °· Take care of the port as told by your health care provider. °· Change your dressing as told by your health care provider. °· Keep all follow-up visits as told by your health care provider. °This information is not intended to replace advice given to you by your health care provider. Make sure you discuss any questions you have with your health care provider. °Document Released: 08/26/2013 Document Revised: 09/26/2016 Document Reviewed: 09/26/2016 °Elsevier Interactive Patient Education © 2017 [employer] Inc[employer] ° °

## 2018-10-10 NOTE — Transfer of Care (Signed)
Immediate Anesthesia Transfer of Care Note  Patient: Ian [name]  Procedure(s) Performed: INSERTION PORT-A-CATH (Left )  Patient Location: PACU  Anesthesia Type:MAC  Level of Consciousness: awake, alert  and oriented  Airway & Oxygen Therapy: Patient Spontanous Breathing  Post-op Assessment: Report given to RN and Post -op Vital signs reviewed and stable  Post vital signs: Reviewed and stable  Last Vitals:  Vitals Value Taken Time  BP    Temp    Pulse 87 10/10/2018 12:54 PM  Resp 17 10/10/2018 12:54 PM  SpO2 96 % 10/10/2018 12:54 PM  Vitals shown include unvalidated device data.  Last Pain:  Vitals:   10/10/18 1057  TempSrc: Oral  PainSc: 0-No pain      Patients Stated Pain Goal: 6 (02/77/41 2878)  Complications: No apparent anesthesia complications

## 2018-10-10 NOTE — Anesthesia Postprocedure Evaluation (Signed)
Anesthesia Post Note  Patient: GENEVA PALLAS  Procedure(s) Performed: INSERTION PORT-A-CATH (catheter in left subclavian) (Left Chest)  Patient location during evaluation: PACU Anesthesia Type: MAC Level of consciousness: awake, awake and alert and oriented Pain management: satisfactory to patient Vital Signs Assessment: post-procedure vital signs reviewed and stable Respiratory status: spontaneous breathing and respiratory function stable Cardiovascular status: stable Postop Assessment: no headache, no apparent nausea or vomiting, able to ambulate, no backache and patient able to bend at knees Anesthetic complications: no     Last Vitals:  Vitals:   10/10/18 1300 10/10/18 1315  BP: (!) 148/69 (!) 158/75  Pulse: 87 83  Resp: (!) 24 14  Temp:    SpO2: 100% 99%    Last Pain:  Vitals:   10/10/18 1300  TempSrc:   PainSc: 0-No pain                 Emagene Merfeld C [place]

## 2018-10-10 NOTE — Anesthesia Preprocedure Evaluation (Signed)
Anesthesia Evaluation  General Assessment Comment:Pt/ Sister with known Pseudocholinesterase Deficiency  Pt had one prolonged vent support after SUX No Sux today  S/p recent GETA here without incident  MAC only planned for today  Reviewed: reviewed documented beta blocker date and time   History of Anesthesia Complications (+) Family history of anesthesia reaction and history of anesthetic complications  Airway Mallampati: II  TM Distance: >3 FB Neck ROM: Full    Dental no notable dental hx. (+) Teeth Intact   Pulmonary neg PE Pt denies OSA or pulm issues or meds    Pulmonary exam normal breath sounds clear to auscultation       Cardiovascular Exercise Tolerance: Good hypertension, Pt. on medications + CAD  Normal cardiovascular examI Rhythm:Regular Rate:Normal  S/p AVR remote -denies any current cardiac Sx  Denies CP    Neuro/Psych    GI/Hepatic GERD  Medicated and Controlled,S/p recent resection of colon Ca with + liver mets  Here for Port placement    Endo/Other  diabetes, Type 2, Oral Hypoglycemic Agents  Renal/GU      Musculoskeletal   Abdominal   Peds  Hematology   Anesthesia Other Findings   Reproductive/Obstetrics                             Anesthesia Physical Anesthesia Plan  ASA: III  Anesthesia Plan: MAC   Post-op Pain Management:    Induction: Intravenous  PONV Risk Score and Plan:   Airway Management Planned: Nasal Cannula and Simple Face Mask  Additional Equipment:   Intra-op Plan:   Post-operative Plan:   Informed Consent: I have reviewed the patients History and Physical, chart, labs and discussed the procedure including the risks, benefits and alternatives for the proposed anesthesia with the patient or authorized representative who has indicated his/her understanding and acceptance.   Dental advisory given  Plan Discussed with: CRNA  Anesthesia  Plan Comments: (Plan MAC , No SUX)        Anesthesia Quick Evaluation

## 2018-10-10 NOTE — Interval H&P Note (Signed)
History and Physical Interval Note:  10/10/2018 12:12 PM  Ian [name]  has presented today for surgery, with the diagnosis of colon cancer  The various methods of treatment have been discussed with the patient and family. After consideration of risks, benefits and other options for treatment, the patient has consented to  Procedure(s): INSERTION PORT-A-CATH (N/A) as a surgical intervention .  The patient's history has been reviewed, patient examined, no change in status, stable for surgery.  I have reviewed the patient's chart and labs.  Questions were answered to the patient's satisfaction.     Aviva Signs

## 2018-10-13 ENCOUNTER — Encounter (HOSPITAL_COMMUNITY): Payer: Self-pay | Admitting: General Surgery

## 2018-10-13 ENCOUNTER — Ambulatory Visit (HOSPITAL_COMMUNITY): Payer: Self-pay | Admitting: Hematology

## 2018-10-15 ENCOUNTER — Ambulatory Visit (HOSPITAL_COMMUNITY): Payer: Self-pay | Admitting: Hematology

## 2018-10-20 ENCOUNTER — Ambulatory Visit (HOSPITAL_COMMUNITY)
Admission: RE | Admit: 2018-10-20 | Discharge: 2018-10-20 | Disposition: A | Discharge status: Home / Self Care | Payer: 59 | Source: Ambulatory Visit | Attending: Nurse Practitioner | Admitting: Nurse Practitioner

## 2018-10-20 ENCOUNTER — Encounter (HOSPITAL_COMMUNITY): Payer: Self-pay | Admitting: Hematology

## 2018-10-20 DIAGNOSIS — C187 Malignant neoplasm of sigmoid colon: Secondary | ICD-10-CM | POA: Diagnosis present

## 2018-10-20 MED ORDER — IOPAMIDOL (ISOVUE-300) INJECTION 61%
100.0000 mL | Freq: Once | INTRAVENOUS | Status: AC | PRN
  Administered 2018-10-20: 100 mL via INTRAVENOUS

## 2018-10-21 ENCOUNTER — Ambulatory Visit (HOSPITAL_COMMUNITY)
Admission: RE | Admit: 2018-10-21 | Discharge: 2018-10-21 | Disposition: A | Discharge status: Home / Self Care | Payer: 59 | Source: Ambulatory Visit | Attending: Hematology | Admitting: Hematology

## 2018-10-21 DIAGNOSIS — I700 Atherosclerosis of aorta: Secondary | ICD-10-CM | POA: Insufficient documentation

## 2018-10-21 DIAGNOSIS — C187 Malignant neoplasm of sigmoid colon: Secondary | ICD-10-CM

## 2018-10-21 MED ORDER — GADOBUTROL 1 MMOL/ML IV SOLN
10.0000 mL | Freq: Once | INTRAVENOUS | Status: AC | PRN
  Administered 2018-10-21: 10 mL via INTRAVENOUS

## 2018-10-23 ENCOUNTER — Inpatient Hospital Stay (HOSPITAL_COMMUNITY): Payer: 59 | Attending: Hematology | Admitting: Hematology

## 2018-10-23 ENCOUNTER — Other Ambulatory Visit: Payer: Self-pay

## 2018-10-23 ENCOUNTER — Encounter (HOSPITAL_COMMUNITY): Payer: Self-pay | Admitting: Hematology

## 2018-10-23 VITALS — BP 174/82 | HR 87 | Temp 98.2°F | Resp 18 | Wt 202.0 lb

## 2018-10-23 DIAGNOSIS — R911 Solitary pulmonary nodule: Secondary | ICD-10-CM | POA: Diagnosis not present

## 2018-10-23 DIAGNOSIS — C787 Secondary malignant neoplasm of liver and intrahepatic bile duct: Secondary | ICD-10-CM | POA: Diagnosis not present

## 2018-10-23 DIAGNOSIS — C187 Malignant neoplasm of sigmoid colon: Secondary | ICD-10-CM | POA: Diagnosis present

## 2018-10-23 DIAGNOSIS — Z5111 Encounter for antineoplastic chemotherapy: Secondary | ICD-10-CM | POA: Diagnosis not present

## 2018-10-23 DIAGNOSIS — I10 Essential (primary) hypertension: Secondary | ICD-10-CM | POA: Insufficient documentation

## 2018-10-23 DIAGNOSIS — E1142 Type 2 diabetes mellitus with diabetic polyneuropathy: Secondary | ICD-10-CM | POA: Insufficient documentation

## 2018-10-23 DIAGNOSIS — Z452 Encounter for adjustment and management of vascular access device: Secondary | ICD-10-CM | POA: Diagnosis not present

## 2018-10-23 NOTE — Progress Notes (Signed)
Ian [name], Ian [name]   CLINIC:  Medical Oncology/Hematology  PCP:  Sharilyn Sites, [place] [address] [phone]   REASON FOR VISIT: Follow-up for Metastatic sigmoid colon cancer to the liver  CURRENT THERAPY: work-up   INTERVAL HISTORY:  Ian [name] 62 y.o. male returns for routine follow-up for metastatic colon cancer to the liver. He is here today with his wife. He reports he has mild numbness and tingling in his toes due to his diabetes. He states this just started this year. It doesn't bother him very often and it comes goes. He has had a cold for a couple of weeks now and still has a cough. He is going to try over the counter cough medications for this. If it doesn't help he will let us know and call us. He denies any nausea, vomiting, or diarrhea. Denies any fevers. Denies any bleeding or easy bruising. Denies any pain. He reports his appetite at 100% and has no problem maintaining his weight. His energy level is 75%.   REVIEW OF SYSTEMS:  Review of Systems  Neurological: Positive for numbness (mild).  All other systems reviewed and are negative.    PAST MEDICAL/SURGICAL HISTORY:  Past Medical History:  Diagnosis Date  . 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 Colon cancer ([place])   . Complication of anesthesia    atypical pseudo-cholinesterase deficiency  . Diabetes mellitus without complication ([place])   . Family history of adverse reaction to anesthesia    sister also has atypical pseudo-cholinesterase deficiency  . GERD (gastroesophageal reflux disease)   . History of heart bypass surgery   . Hypertension    Past Surgical History:  Procedure Laterality Date  . COLONOSCOPY N/A 08/29/2018   Procedure: COLONOSCOPY;  Surgeon: Daneil Dolin, MD;  Location: [facility];  Service: Endoscopy;  Laterality: N/A;  9:30  . CORONARY ARTERY BYPASS GRAFT     4 vessels  . heart bypass    . INSERTION  OF MESH  02/07/2015   Procedure: INSERTION OF MESH;  Surgeon: Aviva Signs Md, MD;  Location: [facility];  Service: General;;  . PARTIAL COLECTOMY N/A 09/17/2018   Procedure: PARTIAL COLECTOMY WITH PARTIAL WEDGE RESECTION LIVER METASTASIS;  Surgeon: Aviva Signs, MD;  Location: [facility];  Service: General;  Laterality: N/A;  . POLYPECTOMY  08/29/2018   Procedure: POLYPECTOMY;  Surgeon: Daneil Dolin, MD;  Location: [facility];  Service: Endoscopy;;  . PORTACATH PLACEMENT Left 10/10/2018   Procedure: INSERTION PORT-A-CATH (catheter in left subclavian);  Surgeon: Aviva Signs, MD;  Location: [facility];  Service: General;  Laterality: Left;  . UMBILICAL HERNIA REPAIR N/A 02/07/2015   Procedure: UMBILICAL HERNIORRHAPHY WITH MESH;  Surgeon: Aviva Signs Md, MD;  Location: [facility];  Service: General;  Laterality: N/A;     SOCIAL HISTORY:  Social History   Socioeconomic History  . Marital status: Married    Spouse name: Not on file  . Number of children: 3  . Years of education: Not on file  . Highest education level: Not on file  Occupational History  . 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 Financial resource strain: Not hard at all  . Food insecurity:    Worry: Never true    Inability: Never true  . Transportation needs:    Medical: No    Non-medical: No  Tobacco Use  . Smoking status: Never  Smoker  . Smokeless tobacco: Never Used  Substance and Sexual Activity  . Alcohol use: No  . Drug use: No  . Sexual activity: Yes    Birth control/protection: None  Lifestyle  . Physical activity:    Days per week: 0 days    Minutes per session: 0 min  . Stress: Not at all  Relationships  . Social connections:    Talks on phone: More than three times a week    Gets together: More than three times a week    Attends religious service: Never    Active member of club or organization: No    Attends meetings of clubs or organizations: Never     Relationship status: Married  . Intimate partner violence:    Fear of current or ex partner: No    Emotionally abused: No    Physically abused: No    Forced sexual activity: No  Other Topics Concern  . Not on file  Social History Narrative  . Not on file    FAMILY HISTORY:  Family History  Problem Relation Age of Onset  . Hypertension Father   . Diabetes Father   . Heart disease Father   . Diabetes Sister   . Hypertension Sister   . Hypertension Brother   . Diabetes Brother   . Diabetes Sister   . Diabetes Brother   . Heart disease Brother   . Lymphoma Brother   . Hypertension Son     CURRENT MEDICATIONS:  Outpatient Encounter Medications as of 10/23/2018  Medication Sig Note  . Coenzyme Q10 (COQ10) 100 MG CAPS Take 100 mg by mouth daily.   Marland Kitchen diltiazem (CARDIZEM CD) 240 MG 24 hr capsule Take 240 mg by mouth daily.   . diphenhydramine-acetaminophen (TYLENOL PM) 25-500 MG TABS tablet Take 2 tablets by mouth at bedtime.   Marland Kitchen ezetimibe-simvastatin (VYTORIN) 10-80 MG per tablet Take 1 tablet by mouth at bedtime.   . hydrochlorothiazide (MICROZIDE) 12.5 MG capsule Take 12.5 mg by mouth daily.   . insulin glargine (LANTUS SOLOSTAR) 100 UNIT/ML injection Inject 20 Units into the skin at bedtime.  10/10/2018: Took 10 units  . insulin lispro protamine-lispro (HUMALOG 75/25 MIX) (75-25) 100 UNIT/ML SUSP injection Inject 16 Units into the skin 3 (three) times daily. 10/10/2018: 1/2 dose  . INVOKANA 300 MG TABS tablet Take 300 mg by mouth daily.   . metFORMIN (GLUCOPHAGE) 1000 MG tablet Take 1,000 mg by mouth 2 (two) times daily.    . metoprolol succinate (TOPROL-XL) 50 MG 24 hr tablet Take 75 mg by mouth 2 (two) times daily.   . niacin (NIASPAN) 500 MG CR tablet Take 500 mg by mouth at bedtime.   . Omega-3 Fatty Acids (FISH OIL PO) Take 1 capsule by mouth daily.   . pantoprazole (PROTONIX) 40 MG tablet Take 40 mg by mouth daily.   . ramipril (ALTACE) 10 MG capsule Take 10 mg by mouth  daily.   . TRADJENTA 5 MG TABS tablet Take 5 mg by mouth daily.     No facility-administered encounter medications on file as of 10/23/2018.     ALLERGIES:  Allergies  Allergen Reactions  . Anectine [Succinylcholine] Other (See Comments)    Atypical pseudocholinesterase deficiency.      PHYSICAL EXAM:  ECOG Performance status: 1  Vitals:   10/23/18 0850  BP: (!) 174/82  Pulse: 87  Resp: 18  Temp: 98.2 F (36.8 C)  SpO2: 99%   Filed Weights   10/23/18  0850  Weight: 202 lb (91.6 kg)    Physical Exam  Constitutional: He is oriented to person, place, and time. He appears well-developed and well-nourished.  Cardiovascular: Normal rate, regular rhythm and normal heart sounds.  Pulmonary/Chest: Effort normal and breath sounds normal.  Musculoskeletal: Normal range of motion.  Neurological: He is alert and oriented to person, place, and time.  Skin: Skin is warm and dry.  Psychiatric: He has a normal mood and affect. His behavior is normal. Judgment and thought content normal.     LABORATORY DATA:  I have reviewed the labs as listed.  CBC    Component Value Date/Time   WBC 11.3 (H) 09/19/2018 0418   RBC 3.78 (L) 09/19/2018 0418   HGB 10.8 (L) 09/19/2018 0418   HCT 34.4 (L) 09/19/2018 0418   PLT 237 09/19/2018 0418   MCV 91.0 09/19/2018 0418   MCH 28.6 09/19/2018 0418   MCHC 31.4 09/19/2018 0418   RDW 13.7 09/19/2018 0418   LYMPHSABS 3.5 09/12/2018 1419   MONOABS 1.1 (H) 09/12/2018 1419   EOSABS 0.1 09/12/2018 1419   BASOSABS 0.1 09/12/2018 1419   CMP Latest Ref Rng & Units 09/19/2018 09/18/2018 09/12/2018  Glucose 70 - 99 mg/dL 234(H) 254(H) 187(H)  BUN 8 - 23 mg/dL '11 18 16  ' Creatinine 0.61 - 1.24 mg/dL 0.93 1.07 1.08  Sodium 135 - 145 mmol/L 139 134(L) 139  Potassium 3.5 - 5.1 mmol/L 3.7 3.7 3.6  Chloride 98 - 111 mmol/L 108 101 102  CO2 22 - 32 mmol/L '24 23 25  ' Calcium 8.9 - 10.3 mg/dL 8.5(L) 8.3(L) 9.5  Total Protein 6.5 - 8.1 g/dL - - 8.3(H)  Total  Bilirubin 0.3 - 1.2 mg/dL - - 0.7  Alkaline Phos 38 - 126 U/L - - 73  AST 15 - 41 U/L - - 21  ALT 0 - 44 U/L - - 22       DIAGNOSTIC IMAGING:  I have independently reviewed the scans and discussed with the patient. .   I have reviewed Francene Finders, NP's note and agree with the documentation.  I personally performed a face-to-face visit, made revisions and my assessment and plan is as follows.    ASSESSMENT & PLAN:   Malignant neoplasm of sigmoid colon ([place]) 1.  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  Pathology was consistent with invasive moderately differentiated adenocarcinoma arising in a tubular adenoma with tumor invading the submucosa with no lymphovascular invasion.  Cauterized resection margin is positive for tumor. -Preoperative CEA on 09/12/2018 was 46.5. - Sigmoid colon segmental resection on 09/17/2018 showing no residual carcinoma in the sigmoid colon, margins uninvolved, 3 tumor deposits present and 1/12 lymph nodes positive.  Liver biopsy was consistent with metastatic carcinoma. - He never had any history of bleeding per rectum or melena.  No family history of colorectal cancer.  Brother died of lymphoma. - MRI of the liver on 10/21/2018 shows complex but nonenhancing lesion at the prior wedge biopsy site measuring 3.1 x 2.3 cm, with a complex cystic cavity with fluid fluid level. - CT CAP shows small nodule in the right upper lobe of the lung measuring 5 mm.  Small amount of soft tissue at the anastomotic site likely postsurgical in etiology. -In view of the CT findings and  MRI of the liver, I have recommended doing a PET CT scan to rule out any metastatic disease.  If he has only one metastatic disease in the liver, we could potentially downsize it with  some neoadjuvant chemotherapy and possibly offer him a resection.  About one third of these patients can be theoretically cured. 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  This is grade 1. -I have recommended non-oxaliplatin-containing regimen with FOLFIRI and bevacizumab.  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 -We talked about side effects of chemotherapy regimen in detail.  Our chemotherapy nurse gave him educational material. -Plan is to start him on chemotherapy and rescan him after 2 to 3 months.  We will follow-up CEA levels.   Total time spent is 40 minutes with more than 50% of the time spent face-to-face discussing scan results, foundation 1 test results, chemotherapy regimen, side effects and coordination of care.  Orders placed this encounter:  Orders Placed This Encounter  Procedures  . NM PET Image Initial (PI) Skull Base To Thigh  . Magnesium  . CBC with Differential/Platelet  . Comprehensive metabolic panel  . Lactate dehydrogenase  . CEA  . CBC with Differential/Platelet  . Comprehensive metabolic panel      Derek Jack, MD [place] [phone]

## 2018-10-23 NOTE — Patient Instructions (Signed)
[facility] [place] saw Dr. Delton Coombes today. _______________________________________________________________  Thank you for choosing [facility] to provide your oncology and hematology care.  To afford each patient quality time with our providers, please arrive at least 15 minutes before your scheduled appointment.  You need to re-schedule your appointment if you arrive 10 or more minutes late.  We strive to give you quality time with our providers, and arriving late affects you and other patients whose appointments are after yours.  Also, if you no show three or more times for appointments you may be dismissed from the clinic.  Again, thank you for choosing [facility] [place] hope is that these requests will allow you access to exceptional care and in a timely manner. _______________________________________________________________  If you have questions after your visit, please contact our office at [phone] between the hours of 8:30 a.m. and 5:00 p.m. Voicemails left after 4:30 p.m. will not be returned until the following business day. _______________________________________________________________  For prescription refill requests, have your pharmacy contact our office. _______________________________________________________________  Recommendations made by the consultant and any test results will be sent to your referring physician. _______________________________________________________________

## 2018-10-23 NOTE — Assessment & Plan Note (Signed)
1.  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  Pathology was consistent with invasive moderately differentiated adenocarcinoma arising in a tubular adenoma with tumor invading the submucosa with no lymphovascular invasion.  Cauterized resection margin is positive for tumor. -Preoperative CEA on 09/12/2018 was 46.5. - Sigmoid colon segmental resection on 09/17/2018 showing no residual carcinoma in the sigmoid colon, margins uninvolved, 3 tumor deposits present and 1/12 lymph nodes positive.  Liver biopsy was consistent with metastatic carcinoma. - He never had any history of bleeding per rectum or melena.  No family history of colorectal cancer.  Brother died of lymphoma. - MRI of the liver on 10/21/2018 shows complex but nonenhancing lesion at the prior wedge biopsy site measuring 3.1 x 2.3 cm, with a complex cystic cavity with fluid fluid level. - CT CAP shows small nodule in the right upper lobe of the lung measuring 5 mm.  Small amount of soft tissue at the anastomotic site likely postsurgical in etiology. -In view of the CT findings and MRI of the liver, I have recommended doing a PET CT scan to rule out any metastatic disease.  If he has only one metastatic disease in the liver, we could potentially downsize it with some neoadjuvant chemotherapy and possibly offer him a resection.  About one third of these patients can be theoretically cured. - He has pre-existing neuropathy in the feet from long-standing diabetes.  This is grade 1. -I have recommended non-oxaliplatin-containing regimen with FOLFIRI and bevacizumab.  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 -We talked about side effects of chemotherapy regimen in detail.  Our chemotherapy nurse gave him  educational material. -Plan is to start him on chemotherapy and rescan him after 2 to 3 months.  We will follow-up CEA levels.

## 2018-10-23 NOTE — Progress Notes (Signed)
START ON PATHWAY REGIMEN - Colorectal     A cycle is every 14 days:     Irinotecan      Leucovorin      5-Fluorouracil      5-Fluorouracil      Bevacizumab-xxxx   **Always confirm dose/schedule in your pharmacy ordering system**  Patient Characteristics: Distant Metastases, First Line, Potentially Resectable, KRAS Mutation Positive/Unknown, BRAF Wild-Type/Unknown, Bevacizumab Eligible Therapeutic Status: Distant Metastases BRAF Mutation Status: Wild-Type (no mutation) KRAS/NRAS Mutation Status: Mutation Positive Line of Therapy: First Line Bevacizumab Eligibility: Eligible Intent of Therapy: Curative Intent, Discussed with Patient

## 2018-10-27 MED ORDER — LIDOCAINE-PRILOCAINE 2.5-2.5 % EX CREA: TOPICAL_CREAM | CUTANEOUS | 2 refills | Status: DC

## 2018-10-27 MED ORDER — LOPERAMIDE HCL 2 MG PO TABS: ORAL_TABLET | ORAL | 1 refills | Status: DC

## 2018-10-27 MED ORDER — LOPERAMIDE HCL 2 MG PO TABS: 2.0000 mg | ORAL_TABLET | Freq: Four times a day (QID) | ORAL | 1 refills | Status: DC | PRN

## 2018-10-27 MED ORDER — PROCHLORPERAZINE MALEATE 10 MG PO TABS: 10.0000 mg | ORAL_TABLET | Freq: Four times a day (QID) | ORAL | 1 refills | Status: DC | PRN

## 2018-10-27 NOTE — Progress Notes (Signed)
Chemotherapy teaching pulled together. 

## 2018-10-27 NOTE — Patient Instructions (Signed)
[facility] Chemotherapy Teaching   You have been diagnosed with stage IV colon cancer. You are going to be treated with curative intent. We will be treating you every 14 days with chemotherapy through your port a cath.  You will be receiving Irinotecan (Camptosar) Leucovorin calcium, fluorouracil (Adrucil, 5-FU) and Bevacizumab (Avastin).  You will get the Adrucil through a home infusion pump.  You will have to return in 2 days to have pump discontinued.  You will see the doctor regularly throughout treatment.  We monitor your lab work prior to every treatment. The doctor monitors your response to treatment by the way you are feeling, your blood work, and scans periodically.  There will be wait times while you are here for treatment.  It will take about 30 minutes to 1 hour for your lab work to result.  Then there will be wait times while pharmacy mixes your medications.   You will have the following premedications prior to treatment:  Aloxi - nausea medication given to prevent chemotherapy induced nausea/vomiting Dexamethasone - high powered steroid given to help with allergic reaction to the medications as well as help with nausea Atropine- medication to prevent or decrease diarrhea induced by chemotherapy   5-Fluorouracil (Adrucil)  About This Drug Fluorouracil is used to treat cancer. It is given in the vein (IV).  Possible Side Effects . Bone marrow suppression. This is a decrease in the number of white blood cells, red blood cells, and platelets. This may raise your risk of infection, make you tired and weak (fatigue), and raise your risk of bleeding . Changes in the tissue of the heart and/or heart attack. Some changes may happen that can cause your heart to have less ability to pump blood. . Blurred vision or other changes in eyesight . Nausea and throwing up (vomiting) . Diarrhea (loose bowel movements) . Ulcers - sores that may cause pain or bleeding in your  digestive tract, which includes your mouth, esophagus, stomach, small/large intestines and rectum . Soreness of the mouth and throat. You may have red areas, white patches, or sores that hurt. . Allergic reactions, including anaphylaxis are rare but may happen in some patients. Signs of allergic reaction to this drug may be swelling of the face, feeling like your tongue or throat are swelling, trouble breathing, rash, itching, fever, chills, feeling dizzy, and/or feeling that your heart is beating in a fast or not normal way. If this happens, do not take another dose of this drug. You should get urgent medical treatment. . Sensitivity to light (photosensitivity). Photosensitivity means that you may become more sensitive to the sun and/or light. You may get a skin rash/reaction if you are in the sun or are exposed to sun lamps and tanning beds. Your eyes may water more, mostly in bright light. . Changes in your nail color, nail loss and/or brittle nail . Darkening of the skin, or changes to the color of your skin and/or veins used for infusion . Rash, dry skin, or itching Note: Not all possible side effects are included above.  Warnings and Precautions . Hand-and-foot syndrome. The palms of your hands or soles of your feet may tingle, become numb, painful, swollen, or red. . Changes in your central nervous system can happen. The central nervous system is made up of your brain and spinal cord. You could feel extreme tiredness, agitation, confusion, hallucinations (see or hear things that are not there), trouble understanding or speaking, loss of control of your  bowels or bladder, eyesight changes, numbness or lack of strength to your arms, legs, face, or body, or coma. If you start to have any of these symptoms let your doctor know right away. . Side effects of this drug may be unexpectedly severe in some patients Note: Some of the side effects above are very rare. If you have concerns and/or  questions, please discuss them with your medical team.  Important Information . This drug may be present in the saliva, tears, sweat, urine, stool, vomit, semen, and vaginal secretions. Talk to your doctor and/or your nurse about the necessary precautions to take during this time.  Treating Side Effects . Manage tiredness by pacing your activities for the day. . Be sure to include periods of rest between energy-draining activities. . To help decrease the risk of infections, wash your hands regularly. . Avoid close contact with people who have a cold, the flu, or other infections. . Take your temperature as your doctor or nurse tells you, and whenever you feel like you may have a fever. . Use a soft toothbrush. Check with your nurse before using dental floss. . Be very careful when using knives or tools. . Use an electric shaver instead of a razor. . If you have a nose bleed, sit with your head tipped slightly forward. Apply pressure by lightly pinching the bridge of your nose between your thumb and forefinger. Call your doctor if you feel dizzy or faint or if the bleeding doesn't stop after 10 to 15 minutes. . Drink plenty of fluids (a minimum of eight glasses per day is recommended). . If you throw up or have loose bowel movements, you should drink more fluids so that you do not become dehydrated (lack of water in the body from losing too much fluid). . To help with nausea and vomiting, eat small, frequent meals instead of three large meals a day. Choose foods and drinks that are at room temperature. Ask your nurse or doctor about other helpful tips and medicine that is available to help, stop, or lessen these symptoms. . If you have diarrhea, eat low-fiber foods that are high in protein and calories and avoid foods that can irritate your digestive tracts or lead to cramping. . Ask your nurse or doctor about medicine that can lessen or stop your diarrhea. . Mouth care is very  important. Your mouth care should consist of routine, gentle cleaning of your teeth or dentures and rinsing your mouth with a mixture of 1/2 teaspoon of salt in 8 ounces of water or 1/2 teaspoon of baking soda in 8 ounces of water. This should be done at least after each meal and at bedtime. . If you have mouth sores, avoid mouthwash that has alcohol. Also avoid alcohol and smoking because they can bother your mouth and throat. Marland Kitchen Keeping your nails moisturized may help with brittleness. . To help with itching, moisturize your skin several times day. . Use sunscreen with SPF 30 or higher when you are outdoors even for a short time. Cover up when you are out in the sun. Wear wide-brimmed hats, long-sleeved shirts, and pants. Keep your neck, chest, and back covered. Wear dark sun glasses when in the sun or bright lights. . If you get a rash do not put anything on it unless your doctor or nurse says you may. Keep the area around the rash clean and dry. Ask your doctor for medicine if your rash bothers you. Marland Kitchen Keeping your pain under control  is important to your well-being. Please tell your doctor or nurse if you are experiencing pain.  Food and Drug Interactions . There are no known interactions of fluorouracil with food. . Check with your doctor or pharmacist about all other prescription medicines and over-the-counter medicines and dietary supplements (vitamins, minerals, herbs and others) you are taking before starting this medicine as there are known drug interactions with 5-fluoroucacil. Also, check with your doctor or pharmacist before starting any new prescription or over-the-counter medicines, or dietary supplements to make sure that there are no interactions.  When to Call the Doctor Call your doctor or nurse if you have any of these symptoms and/or any new or unusual symptoms: . Fever of 100.4 F (38 C) or higher . Chills . Easy bleeding or bruising . Nose bleed that doesn't stop  bleeding after 10-15 minutes . Trouble breathing . Feeling dizzy or lightheaded . Feeling that your heart is beating in a fast or not normal way (palpitations) . Chest pain or symptoms of a heart attack. Most heart attacks involve pain in the center of the chest that lasts more than a few minutes. The pain may go away and come back or it can be constant. It can feel like pressure, squeezing, fullness, or pain. Sometimes pain is felt in one or both arms, the back, neck, jaw, or stomach. If any of these symptoms last 2 minutes, call 911. Marland Kitchen Confusion and/or agitation . Hallucinations . Trouble understanding or speaking . Loss of control of bowels or bladder . Blurry vision or changes in your eyesight . Headache that does not go away . Numbness or lack of strength to your arms, legs, face, or body . Nausea that stops you from eating or drinking and/or is not relieved by prescribed medicines . Throwing up more than 3 times a day . Diarrhea, 4 times in one day or diarrhea with lack of strength or a feeling of being dizzy . Pain in your mouth or throat that makes it hard to eat or drink . Pain along the digestive tract - especially if worse after eating . Blood in your vomit (bright red or coffee-ground) and/or stools (bright red, or black/tarry) . Coughing up blood . Tiredness that interferes with your daily activities . Painful, red, or swollen areas on your hands or feet or around your nails . A new rash or a rash that is not relieved by prescribed medicines . Develop sensitivity to sunlight/light . Numbness and/or tingling of your hands and/or feet . Signs of allergic reaction: swelling of the face, feeling like your tongue or throat are swelling, trouble breathing, rash, itching, fever, chills, feeling dizzy, and/or feeling that your heart is beating in a fast or not normal way. If this happens, call 911 for emergency care. . If you think you are pregnant or may have impregnated your  partner  Reproduction Warnings . Pregnancy warning: This drug may have harmful effects on the unborn baby. Women of child bearing potential should use effective methods of birth control during your cancer treatment and 3 months after treatment. Men with male partners of childbearing potential should use effective methods of birth control during your cancer treatment and for 3 months after your cancer treatment. Let your doctor know right away if you think you may be pregnant or may have impregnated your partner. . Breastfeeding warning: It is not known if this drug passes into breast milk. For this reason, Women should not breastfeed during treatment because this drug  could enter the breast milk and cause harm to a breastfeeding baby. . Fertility warning: In men and women both, this drug may affect your ability to have children in the future. Talk with your doctor or nurse if you plan to have children. Ask for information on sperm or egg banking.  Bevacizumab (Avastin)  About This Drug Bevacizumab is used to treat cancer. It is given in the vein (IV).  Possible Side Effects . Teary eyes . Bleeding in your rectum . Runny/stuffy nose . Nosebleed . Changes in the way food and drinks taste . Electrolyte changes . Headache . Back pain . Changes to your kidneys, which could cause protein in your urine. . Dry skin . A red skin rash which can be peeling or scaling . High blood pressure Note: Each of the side effects above was reported in 10% or greater of patients treated with bevacizumab. Not all possible side effects are included above.  Warnings and Precautions . Perforation or fistula- an abnormal hole in your stomach, intestine, esophagus, or other organ, which can be life-threatening . Slow wound healing, which can be life-threatening . Abnormal bleeding which can be life-threatening - symptoms may be coughing up blood, throwing up blood (may look like coffee grounds), red  or black tarry bowel movements, abnormally heavy menstrual flow, nosebleeds or any other unusual bleeding. . Blood clots and events such as stroke and heart attack. A blood clot in your leg may cause your leg to swell, appear red and warm, and/or cause pain. A blood clot in your lungs may cause trouble breathing, pain when breathing, and/or chest pain. . Severe high blood pressure . Changes in your central nervous system can happen. The central nervous system is made up of your brain and spinal cord. You could feel extreme tiredness, agitation, confusion, hallucinations (see or hear things that are not there), trouble understanding or speaking, loss of control of your bowels or bladder, eyesight changes, numbness or lack of strength to your arms, legs, face, or body, and coma. If you start to have any of these symptoms let your doctor know right away. . Changes to your kidneys, which could cause protein in your urine and kidney failure, which can be life-threatening . While you are getting this drug in your vein (IV), you may have a reaction to the drug. Sometimes you may be given medication to stop or lessen these side effects. Your nurse will check you closely for these signs: fever or shaking chills, flushing, facial swelling, feeling dizzy, headache, trouble breathing, rash, itching, chest tightness, or chest pain. These reactions may happen after your infusion. If this happens, call 911 for emergency care. . In women, changes in your ovaries may happen that may cause menstrual bleeding to become irregular or stop, and may impair fertility . Congestive heart failure - your heart has less ability to pump blood properly. Note: Some of the side effects above are very rare. If you have concerns and/or questions, please discuss them with your medical team.  Important Information . Bevacizumab may cause slow wound healing. It should not be given for at least 28 days before surgery, and for at  least 28 days after surgery and until wound is fully healed. If you must have emergency surgery or have an accident that results in a wound, tell the doctor that you are on bevacizumab. . This drug may be present in the saliva, tears, sweat, urine, stool, vomit, semen, and vaginal secretions. Talk to your doctor  and/or your nurse about the necessary precautions to take during this time.  Treating Side Effects . Keeping your pain under control is important to your well-being. Please tell your doctor or nurse if you are experiencing pain. . Taking good care of your mouth may help food taste better and improve your appetite. . If you have a nose bleed, sit with your head tipped slightly forward. Apply pressure by lightly pinching the bridge of your nose between your thumb and forefinger. Call your doctor if you feel dizzy or faint or if the bleeding doesn't stop after 10 to 15 minutes. . To help with dry skin, moisturize your skin several times day. . Avoid sun exposure and apply sunscreen routinely when outdoors. . If you get a rash do not put anything on it unless your doctor or nurse says you may. Keep the area around the rash clean and dry. Ask your doctor for medicine if your rash bothers you. . Infusion reactions may happen after your infusion. If this happens, call 911 for emergency care.  Food and Drug Interactions . There are no known interactions of bevacizumab with food. . This drug may interact with other medicines. Tell your doctor and pharmacist about all the medicines and dietary supplements (vitamins, minerals, herbs and others) that you are taking at this time. Also, check with your doctor or pharmacist before starting any new prescription or over-thecounter medicines, or dietary supplements to make sure that there are no interactions.  When to Call the Doctor Call your doctor or nurse if you have any of these symptoms and/or any new or unusual symptoms: . Fever of 100.4 F  (38 C) or higher . Chills . Confusion and/or agitation . Hallucinations . Trouble understanding or speaking . Headache that does not go away . Nose bleed that doesn't stop bleeding after 10 -15 minutes . Feeling dizzy or lightheaded . Blurry vision or changes in your eyesight . Difficulty swallowing . Easy bleeding or bruising . Blood in your urine, vomit (bright red or coffee-ground) and/or stools ( bright red, or black/tarry) . Coughing up blood . Wheezing and/or trouble breathing . Chest pain or symptoms of a heart attack. Most heart attacks involve pain in the center of the chest that lasts more than a few minutes. The pain may go away and come back. It can feel like pressure, squeezing, fullness, or pain. Sometimes pain is felt in one or both arms, the back, neck, jaw, or stomach. If any of these symptoms last 2 minutes, call 911. Marland Kitchen Symptoms of a stroke such as sudden numbness or weakness of your face, arm, or leg, mostly on one side of your body; sudden confusion, trouble speaking or understanding; sudden trouble seeing in one or both eyes; sudden trouble walking, feeling dizzy, loss of balance or coordination; or sudden, bad headache with no known cause. If you have any of these symptoms for 2 minutes, call 911. . Numbness or lack of strength to your arms, legs, face, or body . Nausea that stops you from eating or drinking or relieved by prescribed medicine . Throwing up more than 3 times a day . Pain in your abdomen that does not go away . Foamy or bubbly-looking urine . Signs of infusion reaction: fever or shaking chills, flushing, facial swelling, feeling dizzy, headache, trouble breathing, rash, itching, chest tightness, or chest pain. . Pain that does not go away or is not relieved by prescribed medicine . Your leg or arm is swollen, red, warm  and/or painful . Swelling of arms, hands, legs and/or feet . Weight gain of 5 pounds in one week (fluid retention) . If you  think you may be pregnant  Reproduction Warnings . Pregnancy warning: This drug can have harmful effects on the unborn baby. Women of child bearing potential should use effective methods of birth control during your cancer treatment and for 6 months after treatment. In women, changes in your ovaries may happen that may cause menstrual bleeding to become irregular or stop, do not assume you cannot get pregnant. Let your doctor know right away if you think you may be pregnant . Breastfeeding warning: Women should not breastfeed during treatment and for 6 months after treatment because this drug could enter the breast milk and cause harm to a breastfeeding baby. . Fertility warning: In women, this drug may affect your ability to have children in the future. Talk with your doctor or nurse if you plan to have children. Ask for information on egg banking.  Irinotecan (Camptosar)  About This Drug Irinotecan is used to treat cancer. It is given in the vein (IV).  Possible Side Effects . Bone marrow depression. This is a decrease in the number of white blood cells, red blood cells, and platelets. This may raise your risk of infection, make you tired and weak (fatigue), and raise your risk of bleeding. . Weakness . Fever . Infection . Hair loss. Hair loss is often temporary, although with certain medicine, hair loss can sometimes be permanent. Hair loss may happen suddenly or gradually. If you lose hair, you may lose it from your head, face, armpits, pubic area, chest, and/or legs. You may also notice your hair getting thin. . Soreness of the mouth and throat. You may have red areas, white patches, or sores that hurt . Nausea and throwing up (vomiting) . Pain in your abdomen . Loose bowel movements (diarrhea) . Constipation (unable to move bowels) . Decreased appetite (decreased hunger) . Weight loss . Changes in your liver function . Pain Note: Each of the side effects above was reported  in 30% or greater of patients treated with irinotecan. Not all possible side effects are included above.  Warnings and Precautions . Severe diarrhea and colitis which is swelling (inflammation) in the colon - symptoms are loose bowel movements (diarrhea) stomach cramping, and sometimes blood in the bowel movements. Very rarely, a hole in your stomach, small and/or large intestine can happen. . Severe bone marrow depression which can be life-threatening . Allergic reactions, including anaphylaxis are rare but may happen in some patients. Signs of allergic reaction to this drug may be swelling of the face, feeling like your tongue or throat are swelling, trouble breathing, rash, itching, fever, chills, feeling dizzy, and/or feeling that your heart is beating in a fast or not normal way. If this happens, do not take another dose of this drug. You should get urgent medical treatment. . Changes in your kidney function which can cause kidney failure and be life-threatening . Inflammation (swelling) of the lungs, which can be life-threatening. You may have a dry cough or trouble breathing. Marland Kitchen Blurred vision or other changes in eyesight as well as feeling dizzy can happen within 24 hours of receiving this medicine. Note: Some of the side effects above are very rare. If you have concerns and/or questions, please discuss them with your medical team.  Important Information . This drug may be present in the saliva, tears, sweat, urine, stool, vomit, semen, and vaginal secretions.  Talk to your doctor and/or your nurse about the necessary precautions to take during this time. . It is important that you notify your doctor and/or nurse at the first sign of loose bowel movements (diarrhea) so they can provide you with anti-diarrhea medication and give you further instructions. Notify your doctor and/or nurse if you are taking anti-diarrhea medication and your symptoms have not improved, or are  worsening.  Treating Side Effects . Manage tiredness by pacing your activities for the day. . Be sure to include periods of rest between energy-draining activities. . To decrease infection, wash your hands regularly . Avoid close contact with people who have a cold, the flu, or other infections. . Take your temperature as your doctor or nurse tells you, and whenever you feel like you may have a fever . To help decrease bleeding, use a soft toothbrush. Check with your nurse before using dental floss. . Be very careful when using knives or tools . Use an electric shaver instead of a razor . To help with hair loss, wash with a mild shampoo and avoid washing your hair everyday . Avoid rubbing your scalp, pat your hair or scalp dry . Avoid coloring your hair . Limit your use of hair spray, electric curlers, blow dryers, and curling irons. . If you are interested in getting a wig, talk to your nurse. You can also call the [facility] at 800-ACS-2345 to find out information about the "Look Good, Feel Better" program close to where you live. It is a free program where women getting chemotherapy can learn about wigs, turbans and scarves as well as makeup techniques and skin and nail care. . Mouth care is very important. Your mouth care should consist of routine, gentle cleaning of your teeth or dentures and rinsing your mouth with a mixture of 1/2 teaspoon of salt in 8 ounces of water or  teaspoon of baking soda in 8 ounces of water. This should be done at least after each meal and at bedtime. . If you have mouth sores, avoid mouthwash that has alcohol. Also avoid alcohol and smoking because they can bother your mouth and throat. . Ask your doctor or nurse about medicines that are available to help stop or lessen constipation. . If you are not able to move your bowels, check with your doctor or nurse before you use enemas, laxatives, or suppositories . Drink plenty of fluids (a  minimum of eight glasses per day is recommended). . If you throw up or have loose bowel movements, you should drink more fluids so that you do not become dehydrated (lack water in the body from losing too much fluid). . If you get diarrhea, eat low-fiber foods that are high in protein and calories and avoid foods that can irritate your digestive tracts or lead to cramping. . Ask your nurse or doctor about medicine that can lessen or stop your diarrhea. . To help with nausea and vomiting, eat small, frequent meals instead of three large meals a day. Choose foods and drinks that are at room temperature. Ask your nurse or doctor about other helpful tips and medicine that is available to help or stop lessen these symptoms. . To help with decreased appetite, eat small, frequent meals . Eat high caloric food such as pudding, ice cream, yogurt and milkshakes. Marland Kitchen Keeping your pain under control is important to your well-being. Please tell your doctor or nurse if you are experiencing pain.  Food and Drug Interactions . This  drug may interact with grapefruit and grapefruit juice. Talk to your doctor as this could make side effects worse. . This drug may interact with other medicines. Tell your doctor and pharmacist about all the prescription and over-the-counter medicines and dietary supplements (vitamins, minerals, herbs and others) that you are taking at this time. The safety and use of dietary supplements and alternative diets are often not known. Using these might affect your cancer or interfere with your treatment. Until more is known, you should not use dietary supplements or alternative diets without your cancer doctor's help. Marland Kitchen Avoid the use of St. John's Wort while taking irinotecan as this may lower the levels of the drug in your body, which can make it less effective.  When to Call the Doctor Call your doctor or nurse if you have any of these symptoms and/or any new or unusual symptoms: .  Fever of 100.4 F (38 C) or higher . Chills . Pain in your chest . Dry cough . Trouble breathing . Feeling dizzy or lightheaded . Easy bleeding or bruising . Pain in your mouth or throat that makes it hard to eat or drink . No bowel movement in 3 days or when you feel uncomfortable . Loose bowel movements (diarrhea) 4 times a day or loose bowel movements with lack of strength or a feeling of being dizzy . Blood in your stool . Severe Abdominal pain that does not go away . Nausea that stops you from eating or drinking and/or is not relieved by prescribed medicines . Throwing up more than 3 times a day . Lasting loss of appetite or rapid weight loss of five pounds in a week . Fatigue that interferes with your daily activities . Decreased urine . Signs of allergic reaction: swelling of the face, feeling like your tongue or throat are swelling, trouble breathing, rash, itching, fever, chills, feeling dizzy, and/or feeling that your heart is beating in a fast or not normal way . If you think you may be pregnant  Reproduction Warnings . Pregnancy warning: This drug can have harmful effects on the unborn baby. Women of child bearing potential should use effective methods of birth control during your cancer treatment. Let your doctor know right away if you think you may be pregnant. . Breastfeeding warning: It is not known if this drug passes into breast milk. For this reason, women should talk to their doctor about the risks and benefits of breast feeding during treatment with this drug because this drug may enter the breast milk and cause harm to a breast feeding baby. . Fertility warning: Human fertility studies have not been done with this drug. Talk with your doctor or nurse if you plan to have children. Ask for information on sperm or egg banking.  Leucovorin Calcium  About This Drug Leucovorin is a vitamin. It is used in combination with other cancer fighting drugs such as  5-fluorouracil and methotrexate. Leucovorin is given in the vein (IV) or by mouth (orally).  Possible Side Effects . Rash and itching Note: Leucovorin by itself has very few side effects. Other side effects you may have can be caused by the other drugs you are taking, such as 5-fluorouracil or methotrexate.  Warnings and Precautions . Allergic reactions, including anaphylaxis are rare but may happen in some patients. Signs of allergic reaction to this drug may be swelling of the face, feeling like your tongue or throat are swelling, trouble breathing, rash, itching, fever, chills, feeling dizzy, and/or feeling that  your heart is beating in a fast or not normal way. If this happens, do not take another dose of this drug. You should get urgent medical treatment.  How to Take Your Medication . Swallow the medicine with or without food. . Missed dose: If you vomit or miss a dose, contact your physician immediately for further instructions. . Storage: Store this medicine in the original container at room temperature. Protect from light. Discuss with your nurse or your doctor how to dispose of unused medicine.  Treating Side Effects . If you get a rash do not put anything on it unless your doctor or nurse says you may. Keep the area around the rash clean and dry. Ask your doctor for medicine if your rash bothers you.  Food and Drug Interactions . There are no known interactions of leucovorin with food. . This drug may interact with other medicines. Tell your doctor and pharmacist about all the prescription and over-the-counter medicines and dietary supplements (vitamins, minerals, herbs and others) that you are taking at this time. The safety and use of dietary supplements and alternative diets are often not known. Using these might affect your cancer or interfere with your treatment. Until more is known, you should not use dietary supplements or alternative diets without your cancer  doctor's help.  When to Call the Doctor Call your doctor or nurse if you have any of these symptoms and/or any new or unusual symptoms: . A new rash or a rash that is not relieved by prescribed medicines . Signs of allergic reaction: swelling of the face, feeling like your tongue or throat are swelling, trouble breathing, rash, itching, fever, chills, feeling dizzy, and/or feeling that your heart is beating in a fast or not normal way . If you think you may be pregnant  Reproduction Warnings . Pregnancy warning: It is not known if this drug may harm an unborn child. For this reason, be sure to talk with your doctor if you are pregnant or planning to become pregnant while receiving this drug. Let your doctor know right away if you think you may be pregnant . Breastfeeding warning: It is not known if this drug passes into breast milk. For this reason, women should talk to their doctor about the risks and benefits of breastfeeding during treatment with this drug because this drug may enter the breast milk and cause harm to a breastfeeding baby. . Fertility warning: Human fertility studies have not been done with this drug. Talk with your doctor or nurse if you plan to have children. Ask for information on sperm or egg banking.  SELF CARE ACTIVITIES WHILE ON CHEMOTHERAPY:  Hydration Increase your fluid intake 48 hours prior to treatment and drink at least 8 to 12 cups (64 ounces) of water/decaffeinated beverages per day after treatment. You can still have your cup of coffee or soda but these beverages do not count as part of your 8 to 12 cups that you need to drink daily. No alcohol intake.  Medications Continue taking your normal prescription medication as prescribed.  If you start any new herbal or new supplements please let us know first to make sure it is safe.  Mouth Care Have teeth cleaned professionally before starting treatment. Keep dentures and partial plates clean. Use soft  toothbrush and do not use mouthwashes that contain alcohol. Biotene is a good mouthwash that is available at most pharmacies or may be ordered by calling [phone]. Use warm salt water gargles (1 teaspoon salt  per 1 quart warm water) before and after meals and at bedtime. Or you may rinse with 2 tablespoons of three-percent hydrogen peroxide mixed in eight ounces of water. If you are still having problems with your mouth or sores in your mouth please call the clinic. If you need dental work, please let the doctor know before you go for your appointment so that we can coordinate the best possible time for you in regards to your chemo regimen. You need to also let your dentist know that you are actively taking chemo. We may need to do labs prior to your dental appointment.  Skin Care Always use sunscreen that has not expired and with SPF (Sun Protection Factor) of 50 or higher. Wear hats to protect your head from the sun. Remember to use sunscreen on your hands, ears, face, & feet.  Use good moisturizing lotions such as udder cream, eucerin, or even Vaseline. Some chemotherapies can cause dry skin, color changes in your skin and nails.    . Avoid long, hot showers or baths. . Use gentle, fragrance-free soaps and laundry detergent. . Use moisturizers, preferably creams or ointments rather than lotions because the thicker consistency is better at preventing skin dehydration. Apply the cream or ointment within 15 minutes of showering. Reapply moisturizer at night, and moisturize your hands every time after you wash them.  Hair Loss (if your doctor says your hair will fall out)  . If your doctor says that your hair is likely to fall out, decide before you begin chemo whether you want to wear a wig. You may want to shop before treatment to match your hair color. . Hats, turbans, and scarves can also camouflage hair loss, although some people prefer to leave their heads uncovered. If you go bare-headed  outdoors, be sure to use sunscreen on your scalp. . Cut your hair short. It eases the inconvenience of shedding lots of hair, but it also can reduce the emotional impact of watching your hair fall out. . Don't perm or color your hair during chemotherapy. Those chemical treatments are already damaging to hair and can enhance hair loss. Once your chemo treatments are done and your hair has grown back, it's OK to resume dyeing or perming hair. With chemotherapy, hair loss is almost always temporary. But when it grows back, it may be a different color or texture. In older adults who still had hair color before chemotherapy, the new growth may be completely gray.  Often, new hair is very fine and soft.  Infection Prevention Please wash your hands for at least 30 seconds using warm soapy water. Handwashing is the #1 way to prevent the spread of germs. Stay away from sick people or people who are getting over a cold. If you develop respiratory systems such as green/yellow mucus production or productive cough or persistent cough let us know and we will see if you need an antibiotic. It is a good idea to keep a pair of gloves on when going into grocery stores/[employer] to decrease your risk of coming into contact with germs on the carts, etc. Carry alcohol hand gel with you at all times and use it frequently if out in public. If your temperature reaches 100.5 or higher please call the clinic and let us know.  If it is after hours or on the weekend please go to the ER if your temperature is over 100.5.  Please have your own personal thermometer at home to use.    Sex  and bodily fluids If you are going to have sex, a condom must be used to protect the person that isn't taking chemotherapy. Chemo can decrease your libido (sex drive). For a few days after chemotherapy, chemotherapy can be excreted through your bodily fluids.  When using the toilet please close the lid and flush the toilet twice.  Do this for a few day  after you have had chemotherapy.   Effects of chemotherapy on your sex life Some changes are simple and won't last long. They won't affect your sex life permanently. Sometimes you may feel: . too tired . not strong enough to be very active . sick or sore  . not in the mood . anxious or low Your anxiety might not seem related to sex. For example, you may be worried about the cancer and how your treatment is going. Or you may be worried about money, or about how you family are coping with your illness. These things can cause stress, which can affect your interest in sex. It's important to talk to your partner about how you feel. Remember - the changes to your sex life don't usually last long. There's usually no medical reason to stop having sex during chemo. The drugs won't have any long term physical effects on your performance or enjoyment of sex. Cancer can't be passed on to your partner during sex  Contraception It's important to use reliable contraception during treatment. Avoid getting pregnant while you or your partner are having chemotherapy. This is because the drugs may harm the baby. Sometimes chemotherapy drugs can leave a man or woman infertile.  This means you would not be able to have children in the future. You might want to talk to someone about permanent infertility. It can be very difficult to learn that you may no longer be able to have children. Some people find counselling helpful. There might be ways to preserve your fertility, although this is easier for men than for women. You may want to speak to a fertility expert. You can talk about sperm banking or harvesting your eggs. You can also ask about other fertility options, such as donor eggs. If you have or have had breast cancer, your doctor might advise you not to take the contraceptive pill. This is because the hormones in it might affect the cancer.  It is not known for sure whether or not chemotherapy drugs can be passed  on through semen or secretions from the vagina. Because of this some doctors advise people to use a barrier method if you have sex during treatment. This applies to vaginal, anal or oral sex. Generally, doctors advise a barrier method only for the time you are actually having the treatment and for about a week after your treatment. Advice like this can be worrying, but this does not mean that you have to avoid being intimate with your partner. You can still have close contact with your partner and continue to enjoy sex.  Animals If you have cats or birds we just ask that you not change the litter or change the cage.  Please have someone else do this for you while you are on chemotherapy.   Food Safety During and After Cancer Treatment Food safety is important for people both during and after cancer treatment. Cancer and cancer treatments, such as chemotherapy, radiation therapy, and stem cell/bone marrow transplantation, often weaken the immune system. This makes it harder for your body to protect itself from foodborne illness, also called food  poisoning. Foodborne illness is caused by eating food that contains harmful bacteria, parasites, or viruses.  Foods to avoid Some foods have a higher risk of becoming tainted with bacteria. These include: Marland Kitchen Unwashed fresh fruit and vegetables, especially leafy vegetables that can hide dirt and other contaminants . Raw sprouts, such as alfalfa sprouts . Raw or undercooked beef, especially ground beef, or other raw or undercooked meat and poultry . Fatty, fried, or spicy foods immediately before or after treatment.  These can sit heavy on your stomach and make you feel nauseous. . Raw or undercooked shellfish, such as oysters. . Sushi and sashimi, which often contain raw fish.  . Unpasteurized beverages, such as unpasteurized fruit juices, raw milk, raw yogurt, or cider . Undercooked eggs, such as soft boiled, over easy, and poached; raw, unpasteurized eggs;  or foods made with raw egg, such as homemade raw cookie dough and homemade mayonnaise Simple steps for food safety Shop smart. . Do not buy food stored or displayed in an unclean area. . Do not buy bruised or damaged fruits or vegetables. . Do not buy cans that have cracks, dents, or bulges. . Pick up foods that can spoil at the end of your shopping trip and store them in a cooler on the way home. Prepare and clean up foods carefully. . Rinse all fresh fruits and vegetables under running water, and dry them with a clean towel or paper towel. . Clean the top of cans before opening them. . After preparing food, wash your hands for 20 seconds with hot water and soap. Pay special attention to areas between fingers and under nails. . Clean your utensils and dishes with hot water and soap. Marland Kitchen Disinfect your kitchen and cutting boards using 1 teaspoon of liquid, unscented bleach mixed into 1 quart of water.   Dispose of old food. . Eat canned and packaged food before its expiration date (the "use by" or "best before" date). . Consume refrigerated leftovers within 3 to 4 days. After that time, throw out the food. Even if the food does not smell or look spoiled, it still may be unsafe. Some bacteria, such as Listeria, can grow even on foods stored in the refrigerator if they are kept for too long. Take precautions when eating out. . At restaurants, avoid buffets and salad bars where food sits out for a long time and comes in contact with many people. Food can become contaminated when someone with a virus, often a norovirus, or another "bug" handles it. . Put any leftover food in a "to-go" container yourself, rather than having the server do it. And, refrigerate leftovers as soon as you get home. . Choose restaurants that are clean and that are willing to prepare your food as you order it cooked.   MEDICATIONS:  Compazine/Prochlorperazine 10mg  tablet. Take 1 tablet every 6 hours as needed for nausea/vomiting. (This can make you sleepy)   EMLA cream. Apply a quarter size amount to port site 1 hour prior to chemo. Do not rub in. Cover with plastic wrap.   Over-the-Counter Meds:  Colace - 100 mg capsules - take 2 capsules daily.  If this doesn't help then you can increase to 2 capsules twice daily.  Call us if this does not help your bowels move.   Imodium 2mg  capsule. Take 2 capsules after the 1st loose stool and then 1 capsule every 2 hours until you go a total of 12 hours without having a loose stool. Call the [facility] if loose stools continue. If diarrhea occurs at bedtime, take 2 capsules at bedtime. Then take 2 capsules every 4 hours until morning. Call [facility].    Diarrhea Sheet   If you are having loose stools/diarrhea, please purchase Imodium and begin taking as outlined:  At the first sign of poorly formed or loose stools you should begin taking Imodium (loperamide) 2 mg capsules.  Take two caplets (4mg ) followed by one caplet (2mg ) every 2 hours until you have had no diarrhea for 12 hours.  During the night take two caplets (4mg ) at bedtime and continue every 4 hours during the night until the morning.  Stop taking Imodium only after there is no sign of diarrhea for 12 hours.    Always call the [facility] if you are having loose stools/diarrhea that you can't get under control.  Loose stools/diarrhea leads to dehydration (loss of water) in your body.  We have other options of trying to get the loose stools/diarrhea to stop but you must let us know!   Constipation Sheet  Colace - 100 mg capsules - take 2 capsules daily.  If this doesn't help then you can increase to 2 capsules twice daily.  Please call if the above does not work for you.   Do not go more than 2 days without a bowel movement.  It is very  important that you do not become constipated.  It will make you feel sick to your stomach (nausea) and can cause abdominal pain and vomiting.   Nausea Sheet   Compazine/Prochlorperazine 10mg  tablet. Take 1 tablet every 6 hours as needed for nausea/vomiting. (This can make you sleepy)  If you are having persistent nausea (nausea that does not stop) please call the [facility] and let us know the amount of nausea that you are experiencing.  If you begin to vomit, you need to call the [facility] and if it is the weekend and you have vomited more than one time and can't get it to stop-go to the Emergency Room.  Persistent nausea/vomiting can lead to dehydration (loss of fluid in your body) and will make you feel terrible.   Ice chips, sips of clear liquids, foods that are @ room temperature, crackers, and toast tend to be better tolerated.   SYMPTOMS TO REPORT AS SOON AS POSSIBLE AFTER TREATMENT:   FEVER GREATER THAN 100.5 F  CHILLS WITH OR WITHOUT FEVER  NAUSEA AND VOMITING THAT IS NOT CONTROLLED WITH YOUR NAUSEA MEDICATION  UNUSUAL SHORTNESS OF BREATH  UNUSUAL BRUISING OR BLEEDING  TENDERNESS IN MOUTH AND THROAT WITH OR WITHOUT PRESENCE OF ULCERS  URINARY PROBLEMS  BOWEL PROBLEMS  UNUSUAL RASH      Wear comfortable clothing and clothing appropriate for easy access to any Portacath or PICC line. Let us  know if there is anything that we can do to make your therapy better!    What to do if you need assistance after hours or on the weekends: CALL [phone].  HOLD on the line, do not hang up.  You will hear multiple messages but at the end you will be connected with a nurse triage line.  They will contact the doctor if necessary.  Most of the time they will be able to assist you.  Do not call the hospital operator.      I have been informed and understand all of the instructions given to me and have received a copy. I have been instructed to call the clinic [phone] or my family physician as soon as possible for continued medical care, if indicated. I do not have any more questions at this time but understand that I may call the [facility] or the Patient Navigator at [phone] during office hours should I have questions or need assistance in obtaining follow-up care.

## 2018-10-28 ENCOUNTER — Other Ambulatory Visit (HOSPITAL_COMMUNITY): Payer: Self-pay | Admitting: Nurse Practitioner

## 2018-10-30 ENCOUNTER — Ambulatory Visit (HOSPITAL_COMMUNITY)
Admission: RE | Admit: 2018-10-30 | Discharge: 2018-10-30 | Disposition: A | Discharge status: Home / Self Care | Payer: 59 | Source: Ambulatory Visit | Attending: Nurse Practitioner | Admitting: Nurse Practitioner

## 2018-10-30 ENCOUNTER — Ambulatory Visit (HOSPITAL_COMMUNITY): Payer: 59

## 2018-10-30 DIAGNOSIS — C187 Malignant neoplasm of sigmoid colon: Secondary | ICD-10-CM

## 2018-10-30 DIAGNOSIS — C189 Malignant neoplasm of colon, unspecified: Secondary | ICD-10-CM | POA: Diagnosis not present

## 2018-10-30 LAB — GLUCOSE, CAPILLARY: Glucose-Capillary: 93 mg/dL (ref 70–99)

## 2018-10-30 MED ORDER — FLUDEOXYGLUCOSE F - 18 (FDG) INJECTION
11.2000 | Freq: Once | INTRAVENOUS | Status: AC
  Administered 2018-10-30: 11.2 via INTRAVENOUS

## 2018-11-03 ENCOUNTER — Inpatient Hospital Stay (HOSPITAL_COMMUNITY): Payer: 59 | Admitting: Hematology

## 2018-11-03 ENCOUNTER — Encounter (HOSPITAL_COMMUNITY): Payer: Self-pay | Admitting: Hematology

## 2018-11-03 ENCOUNTER — Inpatient Hospital Stay (HOSPITAL_COMMUNITY): Payer: 59

## 2018-11-03 ENCOUNTER — Other Ambulatory Visit: Payer: Self-pay

## 2018-11-03 VITALS — BP 165/68 | HR 85 | Temp 97.4°F | Resp 18

## 2018-11-03 VITALS — BP 157/73 | HR 91 | Temp 98.3°F | Resp 16 | Wt 203.4 lb

## 2018-11-03 DIAGNOSIS — C187 Malignant neoplasm of sigmoid colon: Secondary | ICD-10-CM

## 2018-11-03 DIAGNOSIS — R160 Hepatomegaly, not elsewhere classified: Secondary | ICD-10-CM

## 2018-11-03 DIAGNOSIS — E1142 Type 2 diabetes mellitus with diabetic polyneuropathy: Secondary | ICD-10-CM | POA: Diagnosis not present

## 2018-11-03 DIAGNOSIS — Z5111 Encounter for antineoplastic chemotherapy: Secondary | ICD-10-CM | POA: Diagnosis not present

## 2018-11-03 DIAGNOSIS — C787 Secondary malignant neoplasm of liver and intrahepatic bile duct: Secondary | ICD-10-CM | POA: Diagnosis not present

## 2018-11-03 DIAGNOSIS — R911 Solitary pulmonary nodule: Secondary | ICD-10-CM | POA: Diagnosis not present

## 2018-11-03 DIAGNOSIS — I10 Essential (primary) hypertension: Secondary | ICD-10-CM

## 2018-11-03 LAB — CBC WITH DIFFERENTIAL/PLATELET
Abs Immature Granulocytes: 0.04 10*3/uL (ref 0.00–0.07)
Basophils Absolute: 0.1 10*3/uL (ref 0.0–0.1)
Basophils Relative: 1 %
Eosinophils Absolute: 0.2 10*3/uL (ref 0.0–0.5)
Eosinophils Relative: 2 %
HCT: 40.9 % (ref 39.0–52.0)
Hemoglobin: 13.2 g/dL (ref 13.0–17.0)
Immature Granulocytes: 0 %
Lymphocytes Relative: 29 %
Lymphs Abs: 2.9 10*3/uL (ref 0.7–4.0)
MCH: 28.8 pg (ref 26.0–34.0)
MCHC: 32.3 g/dL (ref 30.0–36.0)
MCV: 89.3 fL (ref 80.0–100.0)
MONO ABS: 1 10*3/uL (ref 0.1–1.0)
Monocytes Relative: 11 %
NEUTROS ABS: 5.6 10*3/uL (ref 1.7–7.7)
Neutrophils Relative %: 57 %
Platelets: 357 10*3/uL (ref 150–400)
RBC: 4.58 MIL/uL (ref 4.22–5.81)
RDW: 13 % (ref 11.5–15.5)
WBC: 9.8 10*3/uL (ref 4.0–10.5)
nRBC: 0 % (ref 0.0–0.2)

## 2018-11-03 LAB — COMPREHENSIVE METABOLIC PANEL
ALT: 17 U/L (ref 0–44)
ANION GAP: 11 (ref 5–15)
AST: 20 U/L (ref 15–41)
Albumin: 4.4 g/dL (ref 3.5–5.0)
Alkaline Phosphatase: 73 U/L (ref 38–126)
BUN: 27 mg/dL — ABNORMAL HIGH (ref 8–23)
CO2: 23 mmol/L (ref 22–32)
Calcium: 9.5 mg/dL (ref 8.9–10.3)
Chloride: 104 mmol/L (ref 98–111)
Creatinine, Ser: 1.37 mg/dL — ABNORMAL HIGH (ref 0.61–1.24)
GFR calc non Af Amer: 55 mL/min — ABNORMAL LOW (ref >60)
Glucose, Bld: 216 mg/dL — ABNORMAL HIGH (ref 70–99)
Potassium: 3.7 mmol/L (ref 3.5–5.1)
Sodium: 138 mmol/L (ref 135–145)
Total Bilirubin: 0.7 mg/dL (ref 0.3–1.2)
Total Protein: 7.8 g/dL (ref 6.5–8.1)

## 2018-11-03 LAB — LACTATE DEHYDROGENASE: LDH: 125 U/L (ref 98–192)

## 2018-11-03 LAB — MAGNESIUM: Magnesium: 2.3 mg/dL (ref 1.7–2.4)

## 2018-11-03 MED ORDER — ATROPINE SULFATE 1 MG/ML IJ SOLN
0.5000 mg | Freq: Once | INTRAMUSCULAR | Status: AC | PRN
  Administered 2018-11-03: 0.5 mg via INTRAVENOUS
  Filled 2018-11-03: qty 1

## 2018-11-03 MED ORDER — SODIUM CHLORIDE 0.9 % IV SOLN
10.0000 mg | Freq: Once | INTRAVENOUS | Status: AC
  Administered 2018-11-03: 10 mg via INTRAVENOUS
  Filled 2018-11-03: qty 1

## 2018-11-03 MED ORDER — SODIUM CHLORIDE 0.9 % IV SOLN
Freq: Once | INTRAVENOUS | Status: AC
  Administered 2018-11-03: 10:00:00 via INTRAVENOUS

## 2018-11-03 MED ORDER — SODIUM CHLORIDE 0.9 % IV SOLN
2400.0000 mg/m2 | INTRAVENOUS | Status: DC
  Administered 2018-11-03: 4850 mg via INTRAVENOUS
  Filled 2018-11-03: qty 97

## 2018-11-03 MED ORDER — SODIUM CHLORIDE 0.9 % IV SOLN
800.0000 mg | Freq: Once | INTRAVENOUS | Status: AC
  Administered 2018-11-03: 800 mg via INTRAVENOUS
  Filled 2018-11-03: qty 5

## 2018-11-03 MED ORDER — AMLODIPINE BESYLATE 5 MG PO TABS: 5.0000 mg | ORAL_TABLET | Freq: Every day | ORAL | 0 refills | Status: DC

## 2018-11-03 MED ORDER — FLUOROURACIL CHEMO INJECTION 2.5 GM/50ML
400.0000 mg/m2 | Freq: Once | INTRAVENOUS | Status: AC
  Administered 2018-11-03: 800 mg via INTRAVENOUS
  Filled 2018-11-03: qty 16

## 2018-11-03 MED ORDER — SODIUM CHLORIDE 0.9 % IV SOLN
INTRAVENOUS | Status: DC
  Administered 2018-11-03: 10:00:00 via INTRAVENOUS

## 2018-11-03 MED ORDER — SODIUM CHLORIDE 0.9 % IV SOLN
180.0000 mg/m2 | Freq: Once | INTRAVENOUS | Status: AC
  Administered 2018-11-03: 360 mg via INTRAVENOUS
  Filled 2018-11-03: qty 5

## 2018-11-03 MED ORDER — SODIUM CHLORIDE 0.9 % IV SOLN
5.0000 mg/kg | Freq: Once | INTRAVENOUS | Status: AC
  Administered 2018-11-03: 450 mg via INTRAVENOUS
  Filled 2018-11-03: qty 4

## 2018-11-03 MED ORDER — PALONOSETRON HCL INJECTION 0.25 MG/5ML
0.2500 mg | Freq: Once | INTRAVENOUS | Status: AC
  Administered 2018-11-03: 0.25 mg via INTRAVENOUS
  Filled 2018-11-03: qty 5

## 2018-11-03 NOTE — Assessment & Plan Note (Addendum)
1.  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  Pathology was consistent with invasive moderately differentiated adenocarcinoma arising in a tubular adenoma with tumor invading the submucosa with no lymphovascular invasion.  Cauterized resection margin is positive for tumor. -Preoperative CEA on 09/12/2018 was 46.5. - Sigmoid colon segmental resection on 09/17/2018 showing no residual carcinoma in the sigmoid colon, margins uninvolved, 3 tumor deposits present and 1/12 lymph nodes positive.  Liver biopsy was consistent with metastatic carcinoma. - He never had any history of bleeding per rectum or melena.  No family history of colorectal cancer.  Brother died of lymphoma. - MRI of the liver on 10/21/2018 shows complex but nonenhancing lesion at the prior wedge biopsy site measuring 3.1 x 2.3 cm, with a complex cystic cavity with fluid fluid level. - CT CAP shows small nodule in the right upper lobe of the lung measuring 5 mm.  Small amount of soft tissue at the anastomotic site likely postsurgical in etiology. - We discussed the results of the PET CT scan dated 10/30/2018 which did not show any distant metastatic disease.  While the wedge resection site along the liver margin is photopenic, there is a 10 7 capsular fluid collection tracking along the liver below the level of the bedside which has  faintly accentuated metabolic activity.  This is an SUV of 4.7 compared to background liver activity of 3.5.  No significant activity in the chest. - I have recommended FOLFIRI based chemotherapy because of his pre-existing neuropathy.  We will add bevacizumab to it.  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 - We discussed the side effects of chemotherapy regimen in  detail.  2.  Peripheral neuropathy: -he has grade 1 peripheral neuropathy in the feet from his longstanding diabetes.  Hence we are not giving oxaliplatin based regimen at this time.  3.  Hypertension: - He is currently on Altace 10 mg daily and HCTZ 12.5 mg daily. -His creatinine is elevated at 1.37.  I have told him to discontinue hydrochlorothiazide.  We will start him on Norvasc 5 mg daily.

## 2018-11-03 NOTE — Progress Notes (Signed)
[address], [place] [address]   CLINIC:  Medical Oncology/Hematology  PCP:  Sharilyn Sites, [address] [place] [address] [phone]   REASON FOR VISIT: Follow-up for Metastatic sigmoid colon cancer to the liver  CURRENT THERAPY: Folfiri and bevacizumab (AVASTIN)  BRIEF ONCOLOGIC HISTORY:    Malignant neoplasm of sigmoid colon ([place])   09/17/2018 Initial Diagnosis    Malignant neoplasm of sigmoid colon ([place])    11/03/2018 -  Chemotherapy    The patient had palonosetron (ALOXI) injection 0.25 mg, 0.25 mg, Intravenous,  Once, 0 of 4 cycles bevacizumab (AVASTIN) 450 mg in sodium chloride 0.9 % 100 mL chemo infusion, 5 mg/kg = 450 mg, Intravenous,  Once, 0 of 4 cycles irinotecan (CAMPTOSAR) 360 mg in dextrose 5 % 500 mL chemo infusion, 180 mg/m2 = 360 mg, Intravenous,  Once, 0 of 4 cycles leucovorin 808 mg in dextrose 5 % 250 mL infusion, 400 mg/m2 = 808 mg, Intravenous,  Once, 0 of 4 cycles fluorouracil (ADRUCIL) chemo injection 800 mg, 400 mg/m2 = 800 mg, Intravenous,  Once, 0 of 4 cycles fluorouracil (ADRUCIL) 4,850 mg in sodium chloride 0.9 % 53 mL chemo infusion, 2,400 mg/m2 = 4,850 mg, Intravenous, 1 Day/Dose, 0 of 4 cycles  for chemotherapy treatment.       INTERVAL HISTORY:  Mr. Heal 62 y.o. male returns for routine follow-up metastatic sigmoid colon cancer to the liver. He is here today with his family. He is feeling well and ready for his first treatment today. He denies any bleeding or dark stools. Denies any GI issues. Denies any recent fevers or infections. He reports his appetite and energy level at 100%. He remains active and works all full time job. He has no problem maintaining his weight at this time.     REVIEW OF SYSTEMS:  Review of Systems  All other systems reviewed and are negative.    PAST MEDICAL/SURGICAL HISTORY:  Past Medical History:  Diagnosis Date  . 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 Colon cancer ([place])   . Complication of anesthesia    atypical pseudo-cholinesterase deficiency  . Diabetes mellitus without complication ([place])   . Family history of adverse reaction to anesthesia    sister also has atypical pseudo-cholinesterase deficiency  . GERD (gastroesophageal reflux disease)   . History of heart bypass surgery   . Hypertension    Past Surgical History:  Procedure Laterality Date  . COLONOSCOPY N/A 08/29/2018   Procedure: COLONOSCOPY;  Surgeon: Daneil Dolin, MD;  Location: [facility];  Service: Endoscopy;  Laterality: N/A;  9:30  . CORONARY ARTERY BYPASS GRAFT     4 vessels  . heart bypass    . INSERTION OF MESH  02/07/2015   Procedure: INSERTION OF MESH;  Surgeon: Aviva Signs Md, MD;  Location: [facility] ORS;  Service: General;;  . PARTIAL COLECTOMY N/A 09/17/2018   Procedure: PARTIAL COLECTOMY WITH PARTIAL WEDGE RESECTION LIVER METASTASIS;  Surgeon: Aviva Signs, MD;  Location: [facility] ORS;  Service: General;  Laterality: N/A;  . POLYPECTOMY  08/29/2018   Procedure: POLYPECTOMY;  Surgeon: Daneil Dolin, MD;  Location: [facility];  Service: Endoscopy;;  . PORTACATH PLACEMENT Left 10/10/2018   Procedure: INSERTION PORT-A-CATH (catheter in left subclavian);  Surgeon: Aviva Signs, MD;  Location: [facility];  Service: General;  Laterality: Left;  . UMBILICAL HERNIA REPAIR N/A 02/07/2015   Procedure: UMBILICAL HERNIORRHAPHY WITH MESH;  Surgeon: Aviva Signs Md, MD;  Location: [facility];  Service: General;  Laterality: N/A;     SOCIAL HISTORY:  Social History   Socioeconomic History  . Marital status: Married    Spouse name: Not on file  . Number of children: 3  . Years of education: Not on file  . Highest education level: Not on file  Occupational History  . 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 Financial resource strain: Not hard at all  . Food insecurity:    Worry: Never true    Inability: Never true  .  Transportation needs:    Medical: No    Non-medical: No  Tobacco Use  . Smoking status: Never Smoker  . Smokeless tobacco: Never Used  Substance and Sexual Activity  . Alcohol use: No  . Drug use: No  . Sexual activity: Yes    Birth control/protection: None  Lifestyle  . Physical activity:    Days per week: 0 days    Minutes per session: 0 min  . Stress: Not at all  Relationships  . Social connections:    Talks on phone: More than three times a week    Gets together: More than three times a week    Attends religious service: Never    Active member of club or organization: No    Attends meetings of clubs or organizations: Never    Relationship status: Married  . Intimate partner violence:    Fear of current or ex partner: No    Emotionally abused: No    Physically abused: No    Forced sexual activity: No  Other Topics Concern  . Not on file  Social History Narrative  . Not on file    FAMILY HISTORY:  Family History  Problem Relation Age of Onset  . Hypertension Father   . Diabetes Father   . Heart disease Father   . Diabetes Sister   . Hypertension Sister   . Hypertension Brother   . Diabetes Brother   . Diabetes Sister   . Diabetes Brother   . Heart disease Brother   . Lymphoma Brother   . Hypertension Son     CURRENT MEDICATIONS:  Outpatient Encounter Medications as of 11/03/2018  Medication Sig Note  . Bevacizumab (AVASTIN IV) Inject 450 mg into the vein every 14 (fourteen) days.   . Coenzyme Q10 (COQ10) 100 MG CAPS Take 100 mg by mouth daily.   Marland Kitchen diltiazem (CARDIZEM CD) 240 MG 24 hr capsule Take 240 mg by mouth daily.   . diphenhydramine-acetaminophen (TYLENOL PM) 25-500 MG TABS tablet Take 2 tablets by mouth at bedtime.   Marland Kitchen ezetimibe-simvastatin (VYTORIN) 10-80 MG per tablet Take 1 tablet by mouth at bedtime.   . hydrochlorothiazide (MICROZIDE) 12.5 MG capsule Take 12.5 mg by mouth daily.   . insulin glargine (LANTUS SOLOSTAR) 100 UNIT/ML injection  Inject 20 Units into the skin at bedtime.  10/10/2018: Took 10 units  . insulin lispro protamine-lispro (HUMALOG 75/25 MIX) (75-25) 100 UNIT/ML SUSP injection Inject 16 Units into the skin 3 (three) times daily. 10/10/2018: 1/2 dose  . INVOKANA 300 MG TABS tablet Take 300 mg by mouth daily.   . IRINOTECAN HCL IV Inject 360 mg into the vein every 14 (fourteen) days.   Marland Kitchen LEUCOVORIN CALCIUM IV Inject 808 mg into the vein every 14 (fourteen) days.   Marland Kitchen lidocaine-prilocaine (EMLA) cream Apply small amount over port site one hour prior to appointment. Cover with plastic wrap.   Marland Kitchen  loperamide (IMODIUM A-D) 2 MG tablet Take 2 capsules after first loose stool and 1 capsule after each loose stool thereafter.   . metFORMIN (GLUCOPHAGE) 1000 MG tablet Take 1,000 mg by mouth 2 (two) times daily.    . metoprolol succinate (TOPROL-XL) 50 MG 24 hr tablet Take 75 mg by mouth 2 (two) times daily.   . niacin (NIASPAN) 500 MG CR tablet Take 500 mg by mouth at bedtime.   . Omega-3 Fatty Acids (FISH OIL PO) Take 1 capsule by mouth daily.   . pantoprazole (PROTONIX) 40 MG tablet Take 40 mg by mouth daily.   . prochlorperazine (COMPAZINE) 10 MG tablet Take 1 tablet (10 mg total) by mouth every 6 (six) hours as needed (NAUSEA).   . ramipril (ALTACE) 10 MG capsule Take 10 mg by mouth daily.   . sodium chloride 0.9 % SOLN with fluorouracil 5 GM/100ML SOLN 200 mg/m2/day Inject 5,650 mg into the vein. Over 46 hours   . TRADJENTA 5 MG TABS tablet Take 5 mg by mouth daily.    Marland Kitchen amLODipine (NORVASC) 5 MG tablet Take 1 tablet (5 mg total) by mouth daily.    No facility-administered encounter medications on file as of 11/03/2018.     ALLERGIES:  Allergies  Allergen Reactions  . Anectine [Succinylcholine] Other (See Comments)    Atypical pseudocholinesterase deficiency.      PHYSICAL EXAM:  ECOG Performance status: 1  Vitals:   11/03/18 0849  BP: (!) 157/73  Pulse: 91  Resp: 16  Temp: 98.3 F (36.8 C)  SpO2: 99%     Filed Weights   11/03/18 0849  Weight: 203 lb 6.4 oz (92.3 kg)    Physical Exam Constitutional:      Appearance: Normal appearance. He is normal weight.  Cardiovascular:     Rate and Rhythm: Normal rate and regular rhythm.     Heart sounds: Normal heart sounds.  Pulmonary:     Effort: Pulmonary effort is normal.     Breath sounds: Normal breath sounds.  Musculoskeletal: Normal range of motion.  Skin:    General: Skin is warm and dry.  Neurological:     Mental Status: He is alert and oriented to person, place, and time. Mental status is at baseline.  Psychiatric:        Mood and Affect: Mood normal.        Thought Content: Thought content normal.        Judgment: Judgment normal.      LABORATORY DATA:  I have reviewed the labs as listed.  CBC    Component Value Date/Time   WBC 9.8 11/03/2018 0832   RBC 4.58 11/03/2018 0832   HGB 13.2 11/03/2018 0832   HCT 40.9 11/03/2018 0832   PLT 357 11/03/2018 0832   MCV 89.3 11/03/2018 0832   MCH 28.8 11/03/2018 0832   MCHC 32.3 11/03/2018 0832   RDW 13.0 11/03/2018 0832   LYMPHSABS 2.9 11/03/2018 0832   MONOABS 1.0 11/03/2018 0832   EOSABS 0.2 11/03/2018 0832   BASOSABS 0.1 11/03/2018 0832   CMP Latest Ref Rng & Units 11/03/2018 09/19/2018 09/18/2018  Glucose 70 - 99 mg/dL 216(H) 234(H) 254(H)  BUN 8 - 23 mg/dL 27(H) 11 18  Creatinine 0.61 - 1.24 mg/dL 1.37(H) 0.93 1.07  Sodium 135 - 145 mmol/L 138 139 134(L)  Potassium 3.5 - 5.1 mmol/L 3.7 3.7 3.7  Chloride 98 - 111 mmol/L 104 108 101  CO2 22 - 32 mmol/L 23 24 23  Calcium 8.9 - 10.3 mg/dL 9.5 8.5(L) 8.3(L)  Total Protein 6.5 - 8.1 g/dL 7.8 - -  Total Bilirubin 0.3 - 1.2 mg/dL 0.7 - -  Alkaline Phos 38 - 126 U/L 73 - -  AST 15 - 41 U/L 20 - -  ALT 0 - 44 U/L 17 - -       DIAGNOSTIC IMAGING:  I have independently reviewed the scans and discussed with the patient.   I have reviewed Francene Finders, NP's note and agree with the documentation.  I personally  performed a face-to-face visit, made revisions and my assessment and plan is as follows.    ASSESSMENT & PLAN:   Malignant neoplasm of sigmoid colon ([place]) 1.  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  Pathology was consistent with invasive moderately differentiated adenocarcinoma arising in a tubular adenoma with tumor invading the submucosa with no lymphovascular invasion.  Cauterized resection margin is positive for tumor. -Preoperative CEA on 09/12/2018 was 46.5. - Sigmoid colon segmental resection on 09/17/2018 showing no residual carcinoma in the sigmoid colon, margins uninvolved, 3 tumor deposits present and 1/12 lymph nodes positive.  Liver biopsy was consistent with metastatic carcinoma. - He never had any history of bleeding per rectum or melena.  No family history of colorectal cancer.  Brother died of lymphoma. - MRI of the liver on 10/21/2018 shows complex but nonenhancing lesion at the prior wedge biopsy site measuring 3.1 x 2.3 cm, with a complex cystic cavity with fluid fluid level. - CT CAP shows small nodule in the right upper lobe of the lung measuring 5 mm.  Small amount of soft tissue at the anastomotic site likely postsurgical in etiology. - We discussed the results of the PET CT scan dated 10/30/2018 which did not show any distant metastatic disease.  While the wedge resection site along the liver margin is photopenic, there is a 10 7 capsular fluid collection tracking along the liver below the level of the bedside which has  faintly accentuated metabolic activity.  This is an SUV of 4.7 compared to background liver activity of 3.5.  No significant activity in the chest. - I have recommended FOLFIRI based chemotherapy because of his pre-existing neuropathy.  We will  add bevacizumab to it.  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 - We discussed the side effects of chemotherapy regimen in detail.  2.  Peripheral neuropathy: -he has grade 1 peripheral neuropathy in the feet from his longstanding diabetes.  Hence we are not giving oxaliplatin based regimen at this time.  3.  Hypertension: - He is currently on Altace 10 mg daily and HCTZ 12.5 mg daily. -His creatinine is elevated at 1.37.  I have told him to discontinue hydrochlorothiazide.  We will start him on Norvasc 5 mg daily.      Orders placed this encounter:  Orders Placed This Encounter  Procedures  . CEA  . CBC with Differential/Platelet  . Comprehensive metabolic panel  . Lactate dehydrogenase      Derek Jack, MD [place] [phone]

## 2018-11-03 NOTE — Progress Notes (Signed)
Okay for tx today per Dr. Delton Coombes.  Will give 500 ml bolus per MD orders for elevated creatinine.  Pt instructed per MD to d/c HCTZ. Okay to give Avastin today per MD w/o urine dipstick since this is his first time receiving Avastin.   Tolerated infusions w/o adverse reaction.  Alert, in no distress.  VSS.  Discharged ambulatory in c/o spouse with ambulatory pump infusing.

## 2018-11-03 NOTE — Patient Instructions (Signed)
[facility] at [facility] Discharge Instructions     Thank you for choosing [facility] at [facility] to provide your oncology and hematology care.  To afford each patient quality time with our provider, please arrive at least 15 minutes before your scheduled appointment time.   If you have a lab appointment with the Cancer Center please come in thru the  Main Entrance and check in at the main information desk  You need to re-schedule your appointment should you arrive 10 or more minutes late.  We strive to give you quality time with our providers, and arriving late affects you and other patients whose appointments are after yours.  Also, if you no show three or more times for appointments you may be dismissed from the clinic at the providers discretion.     Again, thank you for choosing [facility].  Our hope is that these requests will decrease the amount of time that you wait before being seen by our physicians.       _____________________________________________________________  Should you have questions after your visit to  Cancer Center, please contact our office at [phone] between the hours of 8:00 a.m. and 4:30 p.m.  Voicemails left after 4:00 p.m. will not be returned until the following business day.  For prescription refill requests, have your pharmacy contact our office and allow 72 hours.    Cancer Center Support Programs:   > Cancer Support Group  2nd Tuesday of the month 1pm-2pm, Journey Room    

## 2018-11-04 LAB — CEA: CEA: 2.3 ng/mL (ref 0.0–4.7)

## 2018-11-04 MED ORDER — CYANOCOBALAMIN 1000 MCG/ML IJ SOLN
INTRAMUSCULAR | Status: AC
  Filled 2018-11-04: qty 1

## 2018-11-05 ENCOUNTER — Encounter (HOSPITAL_COMMUNITY): Payer: Self-pay

## 2018-11-05 ENCOUNTER — Inpatient Hospital Stay (HOSPITAL_COMMUNITY): Payer: 59

## 2018-11-05 VITALS — BP 143/61 | HR 81 | Temp 98.0°F | Resp 18

## 2018-11-05 DIAGNOSIS — C187 Malignant neoplasm of sigmoid colon: Secondary | ICD-10-CM

## 2018-11-05 DIAGNOSIS — R160 Hepatomegaly, not elsewhere classified: Secondary | ICD-10-CM

## 2018-11-05 DIAGNOSIS — Z5111 Encounter for antineoplastic chemotherapy: Secondary | ICD-10-CM | POA: Diagnosis not present

## 2018-11-05 MED ORDER — HEPARIN SOD (PORK) LOCK FLUSH 100 UNIT/ML IV SOLN
500.0000 [IU] | Freq: Once | INTRAVENOUS | Status: AC | PRN
  Administered 2018-11-05: 500 [IU]

## 2018-11-05 MED ORDER — SODIUM CHLORIDE 0.9% FLUSH
10.0000 mL | INTRAVENOUS | Status: DC | PRN
  Administered 2018-11-05: 10 mL
  Filled 2018-11-05: qty 10

## 2018-11-05 NOTE — Patient Instructions (Signed)
[facility] at [facility]  Discharge Instructions:   _______________________________________________________________  Thank you for choosing [facility] at [facility] to provide your oncology and hematology care.  To afford each patient quality time with our providers, please arrive at least 15 minutes before your scheduled appointment.  You need to re-schedule your appointment if you arrive 10 or more minutes late.  We strive to give you quality time with our providers, and arriving late affects you and other patients whose appointments are after yours.  Also, if you no show three or more times for appointments you may be dismissed from the clinic.  Again, thank you for choosing  Cancer Center at [facility]. Our hope is that these requests will allow you access to exceptional care and in a timely manner. _______________________________________________________________  If you have questions after your visit, please contact our office at [phone] between the hours of 8:30 a.m. and 5:00 p.m. Voicemails left after 4:30 p.m. will not be returned until the following business day. _______________________________________________________________  For prescription refill requests, have your pharmacy contact our office. _______________________________________________________________  Recommendations made by the consultant and any test results will be sent to your referring physician. _______________________________________________________________ 

## 2018-11-05 NOTE — Progress Notes (Signed)
Patients chemotherapy pump disconnected.  Flushed easily with no complaints of pain.  Port site clean and dry with no bruising or swelling noted at site.  Band aid applied.  VSs with discharge and left ambulatory with no s/s of distress noted.

## 2018-11-07 ENCOUNTER — Telehealth (HOSPITAL_COMMUNITY): Payer: Self-pay

## 2018-11-07 NOTE — Telephone Encounter (Signed)
24 hour follow up- spoke with wife, she stated that he was having a better day today, he had some nausea yesterday but better today. Encouraged her to call for any questions or concerns.

## 2018-11-17 ENCOUNTER — Inpatient Hospital Stay (HOSPITAL_COMMUNITY): Payer: 59

## 2018-11-17 DIAGNOSIS — R160 Hepatomegaly, not elsewhere classified: Secondary | ICD-10-CM

## 2018-11-17 DIAGNOSIS — Z5111 Encounter for antineoplastic chemotherapy: Secondary | ICD-10-CM | POA: Diagnosis not present

## 2018-11-17 DIAGNOSIS — C187 Malignant neoplasm of sigmoid colon: Secondary | ICD-10-CM

## 2018-11-17 LAB — URINALYSIS, DIPSTICK ONLY
Bilirubin Urine: NEGATIVE
Glucose, UA: >=500 mg/dL — AB
KETONES UR: NEGATIVE mg/dL
Leukocytes, UA: NEGATIVE
Nitrite: NEGATIVE
PH: 5 (ref 5.0–8.0)
Protein, ur: 30 mg/dL — AB
Specific Gravity, Urine: 1.024 (ref 1.005–1.030)

## 2018-11-17 LAB — CBC WITH DIFFERENTIAL/PLATELET
Abs Immature Granulocytes: 0.01 10*3/uL (ref 0.00–0.07)
Basophils Absolute: 0.1 10*3/uL (ref 0.0–0.1)
Basophils Relative: 1 %
Eosinophils Absolute: 0.1 10*3/uL (ref 0.0–0.5)
Eosinophils Relative: 2 %
HCT: 38.3 % — ABNORMAL LOW (ref 39.0–52.0)
Hemoglobin: 12.3 g/dL — ABNORMAL LOW (ref 13.0–17.0)
Immature Granulocytes: 0 %
Lymphocytes Relative: 30 %
Lymphs Abs: 2.2 10*3/uL (ref 0.7–4.0)
MCH: 27.9 pg (ref 26.0–34.0)
MCHC: 32.1 g/dL (ref 30.0–36.0)
MCV: 86.8 fL (ref 80.0–100.0)
Monocytes Absolute: 0.7 10*3/uL (ref 0.1–1.0)
Monocytes Relative: 9 %
Neutro Abs: 4.2 10*3/uL (ref 1.7–7.7)
Neutrophils Relative %: 58 %
Platelets: 234 10*3/uL (ref 150–400)
RBC: 4.41 MIL/uL (ref 4.22–5.81)
RDW: 13.5 % (ref 11.5–15.5)
WBC: 7.3 10*3/uL (ref 4.0–10.5)
nRBC: 0 % (ref 0.0–0.2)

## 2018-11-17 LAB — COMPREHENSIVE METABOLIC PANEL
ALT: 19 U/L (ref 0–44)
AST: 19 U/L (ref 15–41)
Albumin: 3.9 g/dL (ref 3.5–5.0)
Alkaline Phosphatase: 68 U/L (ref 38–126)
Anion gap: 10 (ref 5–15)
BILIRUBIN TOTAL: 0.4 mg/dL (ref 0.3–1.2)
BUN: 22 mg/dL (ref 8–23)
CALCIUM: 9.3 mg/dL (ref 8.9–10.3)
CO2: 22 mmol/L (ref 22–32)
Chloride: 108 mmol/L (ref 98–111)
Creatinine, Ser: 1.02 mg/dL (ref 0.61–1.24)
GFR calc Af Amer: >60 mL/min (ref >60)
GFR calc non Af Amer: >60 mL/min (ref >60)
Glucose, Bld: 178 mg/dL — ABNORMAL HIGH (ref 70–99)
Potassium: 3.7 mmol/L (ref 3.5–5.1)
Sodium: 140 mmol/L (ref 135–145)
Total Protein: 7.1 g/dL (ref 6.5–8.1)

## 2018-11-17 LAB — LACTATE DEHYDROGENASE: LDH: 131 U/L (ref 98–192)

## 2018-11-18 ENCOUNTER — Inpatient Hospital Stay (HOSPITAL_COMMUNITY): Payer: 59

## 2018-11-18 VITALS — BP 162/83 | HR 81 | Temp 97.6°F | Resp 18 | Wt 208.8 lb

## 2018-11-18 DIAGNOSIS — C187 Malignant neoplasm of sigmoid colon: Secondary | ICD-10-CM

## 2018-11-18 DIAGNOSIS — R160 Hepatomegaly, not elsewhere classified: Secondary | ICD-10-CM

## 2018-11-18 DIAGNOSIS — Z5111 Encounter for antineoplastic chemotherapy: Secondary | ICD-10-CM | POA: Diagnosis not present

## 2018-11-18 LAB — CEA: CEA: 2.5 ng/mL (ref 0.0–4.7)

## 2018-11-18 MED ORDER — FLUOROURACIL CHEMO INJECTION 2.5 GM/50ML
400.0000 mg/m2 | Freq: Once | INTRAVENOUS | Status: AC
  Administered 2018-11-18: 800 mg via INTRAVENOUS
  Filled 2018-11-18: qty 16

## 2018-11-18 MED ORDER — PALONOSETRON HCL INJECTION 0.25 MG/5ML
0.2500 mg | Freq: Once | INTRAVENOUS | Status: AC
  Administered 2018-11-18: 0.25 mg via INTRAVENOUS

## 2018-11-18 MED ORDER — IRINOTECAN HCL CHEMO INJECTION 100 MG/5ML
180.0000 mg/m2 | Freq: Once | INTRAVENOUS | Status: AC
  Administered 2018-11-18: 360 mg via INTRAVENOUS
  Filled 2018-11-18: qty 15

## 2018-11-18 MED ORDER — ATROPINE SULFATE 1 MG/ML IJ SOLN
0.5000 mg | Freq: Once | INTRAMUSCULAR | Status: AC | PRN
  Administered 2018-11-18: 0.5 mg via INTRAVENOUS

## 2018-11-18 MED ORDER — SODIUM CHLORIDE 0.9 % IV SOLN
Freq: Once | INTRAVENOUS | Status: AC
  Administered 2018-11-18: 09:00:00 via INTRAVENOUS

## 2018-11-18 MED ORDER — SODIUM CHLORIDE 0.9 % IV SOLN
10.0000 mg | Freq: Once | INTRAVENOUS | Status: AC
  Administered 2018-11-18: 10 mg via INTRAVENOUS
  Filled 2018-11-18: qty 1

## 2018-11-18 MED ORDER — LEUCOVORIN CALCIUM INJECTION 350 MG
800.0000 mg | Freq: Once | INTRAVENOUS | Status: AC
  Administered 2018-11-18: 800 mg via INTRAVENOUS
  Filled 2018-11-18: qty 5

## 2018-11-18 MED ORDER — SODIUM CHLORIDE 0.9% FLUSH
10.0000 mL | INTRAVENOUS | Status: DC | PRN
  Administered 2018-11-18: 10 mL
  Filled 2018-11-18: qty 10

## 2018-11-18 MED ORDER — ATROPINE SULFATE 1 MG/ML IJ SOLN
INTRAMUSCULAR | Status: AC
  Filled 2018-11-18: qty 1

## 2018-11-18 MED ORDER — SODIUM CHLORIDE 0.9 % IV SOLN
5.0000 mg/kg | Freq: Once | INTRAVENOUS | Status: AC
  Administered 2018-11-18: 450 mg via INTRAVENOUS
  Filled 2018-11-18: qty 16

## 2018-11-18 MED ORDER — PALONOSETRON HCL INJECTION 0.25 MG/5ML
INTRAVENOUS | Status: AC
  Filled 2018-11-18: qty 5

## 2018-11-18 MED ORDER — SODIUM CHLORIDE 0.9 % IV SOLN
2400.0000 mg/m2 | INTRAVENOUS | Status: DC
  Administered 2018-11-18: 4850 mg via INTRAVENOUS
  Filled 2018-11-18: qty 97

## 2018-11-18 NOTE — Patient Instructions (Signed)
[facility] Discharge Instructions for Patients Receiving Chemotherapy   Beginning January 23rd 2017 lab work for the Cancer Center will be done in the  Main lab at [facility] on 1st floor. If you have a lab appointment with the Cancer Center please come in thru the  Main Entrance and check in at the main information desk   Today you received the following chemotherapy agents Avastin, Irinotecan, Leucovorin, and 5FU  To help prevent nausea and vomiting after your treatment, we encourage you to take your nausea medication   If you develop nausea and vomiting, or diarrhea that is not controlled by your medication, call the clinic.  The clinic phone number is [phone]. Office hours are Monday-Friday 8:30am-5:00pm.  BELOW ARE SYMPTOMS THAT SHOULD BE REPORTED IMMEDIATELY:  *FEVER GREATER THAN 101.0 F  *CHILLS WITH OR WITHOUT FEVER  NAUSEA AND VOMITING THAT IS NOT CONTROLLED WITH YOUR NAUSEA MEDICATION  *UNUSUAL SHORTNESS OF BREATH  *UNUSUAL BRUISING OR BLEEDING  TENDERNESS IN MOUTH AND THROAT WITH OR WITHOUT PRESENCE OF ULCERS  *URINARY PROBLEMS  *BOWEL PROBLEMS  UNUSUAL RASH Items with * indicate a potential emergency and should be followed up as soon as possible. If you have an emergency after office hours please contact your primary care physician or go to the nearest emergency department.  Please call the clinic during office hours if you have any questions or concerns.   You may also contact the Patient Navigator at [phone] should you have any questions or need assistance in obtaining follow up care.      Resources For Cancer Patients and their Caregivers ? American Cancer Society: Can assist with transportation, wigs, general needs, runs Look Good Feel Better.        1-888-227-6333 ? Cancer Care: Provides financial assistance, online support groups, medication/co-pay assistance.  1-800-813-HOPE (4673) ? [facility] Assists [place] cancer patients and their families through emotional , educational and financial support.  336-427-4357 ? [place] DSS Where to apply for food stamps, Medicaid and utility assistance. 336-342-1394 ? [facility]: Transportation to medical appointments. 336-347-2287 ? Social Security Administration: May apply for disability if have a Stage IV cancer. 336-342-7796 1-800-772-1213 ? [place] Aging, Disability and Transit Services: Assists with nutrition, care and transit needs. 336-349-2343          

## 2018-11-18 NOTE — Progress Notes (Signed)
0830 lab work and UA, including urine protein of 30, reviewed with Dr. Delton Coombes and patient approved for Avastin and FOLFIRI treatment today.   Ian [name] tolerated chemo treatment today without incident or complaint. VSS upon completion of treatment. 5FU infusing through home infusion pump without difficulties. Discharged self ambulatory in satisfactory condition in presence of wife.

## 2018-11-20 ENCOUNTER — Inpatient Hospital Stay (HOSPITAL_COMMUNITY): Payer: 59 | Attending: Hematology

## 2018-11-20 ENCOUNTER — Encounter (HOSPITAL_COMMUNITY): Payer: Self-pay

## 2018-11-20 ENCOUNTER — Other Ambulatory Visit: Payer: Self-pay

## 2018-11-20 VITALS — BP 171/77 | HR 93 | Temp 97.8°F | Resp 18

## 2018-11-20 DIAGNOSIS — Z452 Encounter for adjustment and management of vascular access device: Secondary | ICD-10-CM | POA: Diagnosis not present

## 2018-11-20 DIAGNOSIS — C787 Secondary malignant neoplasm of liver and intrahepatic bile duct: Secondary | ICD-10-CM | POA: Insufficient documentation

## 2018-11-20 DIAGNOSIS — Z5111 Encounter for antineoplastic chemotherapy: Secondary | ICD-10-CM | POA: Insufficient documentation

## 2018-11-20 DIAGNOSIS — Z5112 Encounter for antineoplastic immunotherapy: Secondary | ICD-10-CM | POA: Insufficient documentation

## 2018-11-20 DIAGNOSIS — C187 Malignant neoplasm of sigmoid colon: Secondary | ICD-10-CM | POA: Insufficient documentation

## 2018-11-20 DIAGNOSIS — I10 Essential (primary) hypertension: Secondary | ICD-10-CM | POA: Diagnosis not present

## 2018-11-20 DIAGNOSIS — E1142 Type 2 diabetes mellitus with diabetic polyneuropathy: Secondary | ICD-10-CM | POA: Insufficient documentation

## 2018-11-20 DIAGNOSIS — R160 Hepatomegaly, not elsewhere classified: Secondary | ICD-10-CM

## 2018-11-20 MED ORDER — HEPARIN SOD (PORK) LOCK FLUSH 100 UNIT/ML IV SOLN
500.0000 [IU] | Freq: Once | INTRAVENOUS | Status: AC | PRN
  Administered 2018-11-20: 500 [IU]

## 2018-11-20 MED ORDER — SODIUM CHLORIDE 0.9% FLUSH
10.0000 mL | INTRAVENOUS | Status: DC | PRN
  Administered 2018-11-20: 10 mL
  Filled 2018-11-20: qty 10

## 2018-11-20 MED ORDER — OCTREOTIDE ACETATE 30 MG IM KIT
PACK | INTRAMUSCULAR | Status: AC
  Filled 2018-11-20: qty 1

## 2018-11-20 NOTE — Progress Notes (Signed)
Rowe Robert presents to have home infusion pump d/c'd and for port-a-cath deaccess with flush.  Proper placement of portacath confirmed by CXR.  Portacath located left chest wall accessed with  H 20 needle.  Good blood return present. Portacath flushed with NS and 500U/66ml Heparin, and needle removed intact.  Procedure tolerated well and without incident.  Discharged ambulatory.

## 2018-12-01 ENCOUNTER — Inpatient Hospital Stay (HOSPITAL_COMMUNITY): Payer: 59

## 2018-12-01 ENCOUNTER — Other Ambulatory Visit: Payer: Self-pay

## 2018-12-01 ENCOUNTER — Encounter (HOSPITAL_COMMUNITY): Payer: Self-pay | Admitting: Hematology

## 2018-12-01 ENCOUNTER — Inpatient Hospital Stay (HOSPITAL_BASED_OUTPATIENT_CLINIC_OR_DEPARTMENT_OTHER): Payer: 59 | Admitting: Hematology

## 2018-12-01 VITALS — BP 138/63 | HR 68 | Temp 97.9°F | Resp 18

## 2018-12-01 VITALS — BP 176/73 | HR 85 | Temp 98.2°F | Resp 16 | Wt 208.3 lb

## 2018-12-01 DIAGNOSIS — R160 Hepatomegaly, not elsewhere classified: Secondary | ICD-10-CM

## 2018-12-01 DIAGNOSIS — I10 Essential (primary) hypertension: Secondary | ICD-10-CM

## 2018-12-01 DIAGNOSIS — E1142 Type 2 diabetes mellitus with diabetic polyneuropathy: Secondary | ICD-10-CM

## 2018-12-01 DIAGNOSIS — C187 Malignant neoplasm of sigmoid colon: Secondary | ICD-10-CM

## 2018-12-01 DIAGNOSIS — Z5112 Encounter for antineoplastic immunotherapy: Secondary | ICD-10-CM | POA: Diagnosis not present

## 2018-12-01 DIAGNOSIS — C787 Secondary malignant neoplasm of liver and intrahepatic bile duct: Secondary | ICD-10-CM

## 2018-12-01 LAB — CBC WITH DIFFERENTIAL/PLATELET
ABS IMMATURE GRANULOCYTES: 0.02 10*3/uL (ref 0.00–0.07)
Basophils Absolute: 0 10*3/uL (ref 0.0–0.1)
Basophils Relative: 1 %
Eosinophils Absolute: 0.2 10*3/uL (ref 0.0–0.5)
Eosinophils Relative: 2 %
HCT: 37.3 % — ABNORMAL LOW (ref 39.0–52.0)
HEMOGLOBIN: 11.9 g/dL — AB (ref 13.0–17.0)
Immature Granulocytes: 0 %
Lymphocytes Relative: 34 %
Lymphs Abs: 2.7 10*3/uL (ref 0.7–4.0)
MCH: 28.1 pg (ref 26.0–34.0)
MCHC: 31.9 g/dL (ref 30.0–36.0)
MCV: 88.2 fL (ref 80.0–100.0)
MONOS PCT: 8 %
Monocytes Absolute: 0.6 10*3/uL (ref 0.1–1.0)
Neutro Abs: 4.4 10*3/uL (ref 1.7–7.7)
Neutrophils Relative %: 55 %
Platelets: 283 10*3/uL (ref 150–400)
RBC: 4.23 MIL/uL (ref 4.22–5.81)
RDW: 14.2 % (ref 11.5–15.5)
WBC: 7.8 10*3/uL (ref 4.0–10.5)
nRBC: 0 % (ref 0.0–0.2)

## 2018-12-01 LAB — COMPREHENSIVE METABOLIC PANEL
ALBUMIN: 3.9 g/dL (ref 3.5–5.0)
ALK PHOS: 74 U/L (ref 38–126)
ALT: 21 U/L (ref 0–44)
AST: 21 U/L (ref 15–41)
Anion gap: 10 (ref 5–15)
BUN: 19 mg/dL (ref 8–23)
CO2: 23 mmol/L (ref 22–32)
Calcium: 9.5 mg/dL (ref 8.9–10.3)
Chloride: 106 mmol/L (ref 98–111)
Creatinine, Ser: 1.02 mg/dL (ref 0.61–1.24)
GFR calc Af Amer: >60 mL/min (ref >60)
GFR calc non Af Amer: >60 mL/min (ref >60)
GLUCOSE: 211 mg/dL — AB (ref 70–99)
Potassium: 3.9 mmol/L (ref 3.5–5.1)
Sodium: 139 mmol/L (ref 135–145)
Total Bilirubin: 0.4 mg/dL (ref 0.3–1.2)
Total Protein: 7.1 g/dL (ref 6.5–8.1)

## 2018-12-01 LAB — URINALYSIS, DIPSTICK ONLY
Bilirubin Urine: NEGATIVE
Hgb urine dipstick: NEGATIVE
Ketones, ur: NEGATIVE mg/dL
Leukocytes, UA: NEGATIVE
Nitrite: NEGATIVE
Protein, ur: 30 mg/dL — AB
Specific Gravity, Urine: 1.021 (ref 1.005–1.030)
pH: 6 (ref 5.0–8.0)

## 2018-12-01 MED ORDER — SODIUM CHLORIDE 0.9 % IV SOLN
10.0000 mg | Freq: Once | INTRAVENOUS | Status: AC
  Administered 2018-12-01: 10 mg via INTRAVENOUS
  Filled 2018-12-01: qty 1

## 2018-12-01 MED ORDER — SODIUM CHLORIDE 0.9% FLUSH
10.0000 mL | INTRAVENOUS | Status: DC | PRN
  Administered 2018-12-01: 10 mL
  Filled 2018-12-01: qty 10

## 2018-12-01 MED ORDER — ATROPINE SULFATE 1 MG/ML IJ SOLN
1.0000 mg | Freq: Once | INTRAMUSCULAR | Status: AC
  Administered 2018-12-01: 1 mg via INTRAVENOUS
  Filled 2018-12-01: qty 1

## 2018-12-01 MED ORDER — IRINOTECAN HCL CHEMO INJECTION 100 MG/5ML
180.0000 mg/m2 | Freq: Once | INTRAVENOUS | Status: AC
  Administered 2018-12-01: 360 mg via INTRAVENOUS
  Filled 2018-12-01: qty 15

## 2018-12-01 MED ORDER — PALONOSETRON HCL INJECTION 0.25 MG/5ML
0.2500 mg | Freq: Once | INTRAVENOUS | Status: AC
  Administered 2018-12-01: 0.25 mg via INTRAVENOUS
  Filled 2018-12-01: qty 5

## 2018-12-01 MED ORDER — SODIUM CHLORIDE 0.9 % IV SOLN
2400.0000 mg/m2 | INTRAVENOUS | Status: DC
  Administered 2018-12-01: 4850 mg via INTRAVENOUS
  Filled 2018-12-01: qty 97

## 2018-12-01 MED ORDER — DARBEPOETIN ALFA 150 MCG/0.3ML IJ SOSY
PREFILLED_SYRINGE | INTRAMUSCULAR | Status: AC
  Filled 2018-12-01: qty 0.3

## 2018-12-01 MED ORDER — EPOETIN ALFA 40000 UNIT/ML IJ SOLN
INTRAMUSCULAR | Status: AC
  Filled 2018-12-01: qty 1

## 2018-12-01 MED ORDER — CYANOCOBALAMIN 1000 MCG/ML IJ SOLN
INTRAMUSCULAR | Status: AC
  Filled 2018-12-01: qty 1

## 2018-12-01 MED ORDER — SODIUM CHLORIDE 0.9 % IV SOLN
150.0000 mg | Freq: Once | INTRAVENOUS | Status: AC
  Administered 2018-12-01: 150 mg via INTRAVENOUS
  Filled 2018-12-01: qty 5

## 2018-12-01 MED ORDER — AMLODIPINE BESYLATE 10 MG PO TABS: 10.0000 mg | ORAL_TABLET | Freq: Every day | ORAL | 1 refills | Status: DC

## 2018-12-01 MED ORDER — SODIUM CHLORIDE 0.9 % IV SOLN
Freq: Once | INTRAVENOUS | Status: AC
  Administered 2018-12-01: 10:00:00 via INTRAVENOUS

## 2018-12-01 MED ORDER — LORAZEPAM 2 MG/ML IJ SOLN
1.0000 mg | Freq: Once | INTRAMUSCULAR | Status: AC
  Administered 2018-12-01: 1 mg via INTRAVENOUS
  Filled 2018-12-01: qty 1

## 2018-12-01 MED ORDER — FLUOROURACIL CHEMO INJECTION 2.5 GM/50ML
400.0000 mg/m2 | Freq: Once | INTRAVENOUS | Status: AC
  Administered 2018-12-01: 800 mg via INTRAVENOUS
  Filled 2018-12-01: qty 16

## 2018-12-01 MED ORDER — LEUCOVORIN CALCIUM INJECTION 350 MG
800.0000 mg | Freq: Once | INTRAVENOUS | Status: AC
  Administered 2018-12-01: 800 mg via INTRAVENOUS
  Filled 2018-12-01: qty 35

## 2018-12-01 MED ORDER — SODIUM CHLORIDE 0.9 % IV SOLN
5.0000 mg/kg | Freq: Once | INTRAVENOUS | Status: AC
  Administered 2018-12-01: 450 mg via INTRAVENOUS
  Filled 2018-12-01: qty 16

## 2018-12-01 MED ORDER — AMLODIPINE BESYLATE 5 MG PO TABS
5.0000 mg | ORAL_TABLET | Freq: Every day | ORAL | Status: DC
  Administered 2018-12-01: 5 mg via ORAL
  Filled 2018-12-01 (×2): qty 1

## 2018-12-01 NOTE — Assessment & Plan Note (Signed)
1.  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  Pathology was consistent with invasive moderately differentiated adenocarcinoma arising in a tubular adenoma with tumor invading the submucosa with no lymphovascular invasion.  Cauterized resection margin is positive for tumor. -Preoperative CEA on 09/12/2018 was 46.5. - Sigmoid colon segmental resection on 09/17/2018 showing no residual carcinoma in the sigmoid colon, margins uninvolved, 3 tumor deposits present and 1/12 lymph nodes positive.  Liver biopsy was consistent with metastatic carcinoma. - He never had any history of bleeding per rectum or melena.  No family history of colorectal cancer.  Brother died of lymphoma. - MRI of the liver on 10/21/2018 shows complex but nonenhancing lesion at the prior wedge biopsy site measuring 3.1 x 2.3 cm, with a complex cystic cavity with fluid fluid level. - CT CAP shows small nodule in the right upper lobe of the lung measuring 5 mm.  Small amount of soft tissue at the anastomotic site likely postsurgical in etiology. - PET CT scan on 10/30/2018 did not show any distant metastatic disease other than in the liver.   - He received FOLFIRI and bevacizumab cycle 1 on 11/03/2018. -Cycle 2 was on 11/18/2018. - He had nausea, vomiting and diarrhea soon after his second cycle.  Only lasted 1 day. - We have added atropine to premedication today.  I will also add Emend to the regimen along with Ativan. - He may proceed with cycle 3 today.  We have reviewed his blood work. - I plan to repeat his scans to evaluate response in 2 to 3 months.  2.  Peripheral neuropathy: -he has grade 1 peripheral neuropathy in the feet from his longstanding diabetes.  Hence we are not giving oxaliplatin based regimen at this  time.  3.  Hypertension: - He is currently on Altace 10 mg daily and HCTZ 12.5 mg daily. -We have started him on Norvasc 5 mg daily.  His systolic blood pressure today is 176.  We will treated today.  I have increased Norvasc to 10 mg daily.

## 2018-12-01 NOTE — Progress Notes (Signed)
Patient seen by Dr. Delton Coombes with lab review.  Ok to treat today and to give the patient Norvasc 5mg  by mouth today and Ativan 1mg  IV today for history of nausea and vomiting verbal order Dr. Delton Coombes.   Patient tolerated treatment with no complaints of nausea or pain.  No other complaints voiced.  Port site clean and dry with no bruising or swelling noted at site.  Dressing intact.  Chemotherapy pump connected with no alarms noted.  VSS with discharge and left ambulatory with family with no s/s of distress noted.

## 2018-12-01 NOTE — Patient Instructions (Signed)
[facility] Discharge Instructions for Patients Receiving Chemotherapy  Today you received the following chemotherapy agents irinotecan and leucovorin.    If you develop nausea and vomiting that is not controlled by your nausea medication, call the clinic.   BELOW ARE SYMPTOMS THAT SHOULD BE REPORTED IMMEDIATELY:  *FEVER GREATER THAN 100.5 F  *CHILLS WITH OR WITHOUT FEVER  NAUSEA AND VOMITING THAT IS NOT CONTROLLED WITH YOUR NAUSEA MEDICATION  *UNUSUAL SHORTNESS OF BREATH  *UNUSUAL BRUISING OR BLEEDING  TENDERNESS IN MOUTH AND THROAT WITH OR WITHOUT PRESENCE OF ULCERS  *URINARY PROBLEMS  *BOWEL PROBLEMS  UNUSUAL RASH Items with * indicate a potential emergency and should be followed up as soon as possible.  Feel free to call the clinic should you have any questions or concerns. The clinic phone number is [phone].  Please show the [facility] at check-in to the Emergency Department and triage nurse.

## 2018-12-01 NOTE — Progress Notes (Signed)
[place], [place] [address]   CLINIC:  Medical Oncology/Hematology  PCP:  Sharilyn Sites, MD [address] [place] [address] [phone]   REASON FOR VISIT: Follow-up for Metastatic sigmoid colon cancer to the liver  CURRENT THERAPY: Folfiri and bevacizumab (AVASTIN)  BRIEF ONCOLOGIC HISTORY:    Malignant neoplasm of sigmoid colon ([place])   09/17/2018 Initial Diagnosis    Malignant neoplasm of sigmoid colon ([place])    11/03/2018 -  Chemotherapy    The patient had palonosetron (ALOXI) injection 0.25 mg, 0.25 mg, Intravenous,  Once, 3 of 6 cycles Administration: 0.25 mg (11/03/2018), 0.25 mg (11/18/2018), 0.25 mg (12/01/2018) bevacizumab (AVASTIN) 450 mg in sodium chloride 0.9 % 100 mL chemo infusion, 5 mg/kg = 450 mg, Intravenous,  Once, 3 of 6 cycles Administration: 450 mg (11/03/2018), 450 mg (11/18/2018) irinotecan (CAMPTOSAR) 360 mg in sodium chloride 0.9 % 500 mL chemo infusion, 180 mg/m2 = 360 mg, Intravenous,  Once, 3 of 6 cycles Administration: 360 mg (11/03/2018), 360 mg (11/18/2018) leucovorin 800 mg in sodium chloride 0.9 % 250 mL infusion, 808 mg, Intravenous,  Once, 3 of 6 cycles Administration: 800 mg (11/03/2018), 800 mg (11/18/2018) fosaprepitant (EMEND) 150 mg in sodium chloride 0.9 % 145 mL IVPB, 150 mg, Intravenous,  Once, 1 of 1 cycle fluorouracil (ADRUCIL) chemo injection 800 mg, 400 mg/m2 = 800 mg, Intravenous,  Once, 3 of 6 cycles Administration: 800 mg (11/03/2018), 800 mg (11/18/2018) fosaprepitant (EMEND) 150 mg, dexamethasone (DECADRON) 10 mg in sodium chloride 0.9 % 145 mL IVPB, , Intravenous,  Once, 0 of 3 cycles fluorouracil (ADRUCIL) 4,850 mg in sodium chloride 0.9 % 53 mL chemo infusion, 2,400 mg/m2 = 4,850 mg, Intravenous, 1 Day/Dose, 3 of 6 cycles Administration: 4,850 mg (11/03/2018), 4,850 mg (11/18/2018)  for chemotherapy treatment.      INTERVAL HISTORY:  Mr. Lagace 63 y.o. male returns for  routine follow-up for metastatic sigmoid colon cancer to the liver. His first treatment he tolerated well with mild nausea. This second treatment he did not tolerate it as well. He had problems with nausea, vomiting, and diarrhea after his last treatment. He can control the diarrhea with the OTC medications but the nausea and vomiting persisted for two days after. He was very weak and his whole body aches. Denies any new pains. Had not noticed any recent bleeding such as epistaxis, hematuria or hematochezia. Denies recent chest pain on exertion, shortness of breath on minimal exertion, pre-syncopal episodes, or palpitations. Denies any numbness or tingling in hands or feet. Denies any recent fevers, infections, or recent hospitalizations. He reports his appetite at 100% and energy level at 75%.    REVIEW OF SYSTEMS:  Review of Systems  Constitutional: Positive for fatigue.  Gastrointestinal: Positive for diarrhea, nausea and vomiting.  All other systems reviewed and are negative.    PAST MEDICAL/SURGICAL HISTORY:  Past Medical History:  Diagnosis Date  . 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 Colon cancer ([place])   . Complication of anesthesia    atypical pseudo-cholinesterase deficiency  . Diabetes mellitus without complication ([place])   . Family history of adverse reaction to anesthesia    sister also has atypical pseudo-cholinesterase deficiency  . GERD (gastroesophageal reflux disease)   . History of heart bypass surgery   . Hypertension    Past Surgical History:  Procedure Laterality Date  . COLONOSCOPY N/A 08/29/2018   Procedure: COLONOSCOPY;  Surgeon: Daneil Dolin, MD;  Location: [facility];  Service: Endoscopy;  Laterality: N/A;  9:30  . CORONARY ARTERY BYPASS GRAFT     4 vessels  . heart bypass    . INSERTION OF MESH  02/07/2015   Procedure: INSERTION OF MESH;  Surgeon: Aviva Signs Md, MD;  Location: [facility] ORS;  Service: General;;  . PARTIAL COLECTOMY N/A 09/17/2018    Procedure: PARTIAL COLECTOMY WITH PARTIAL WEDGE RESECTION LIVER METASTASIS;  Surgeon: Aviva Signs, MD;  Location: [facility] OR[facility];  Service: General;  Laterality: N/A;  . POLYPECTOMY  08/29/2018   Procedure: POLYPECTOMY;  Surgeon: Daneil Dolin, MD;  Location: [facility];  Service: Endoscopy;;  . PORTACATH PLACEMENT Left 10/10/2018   Procedure: INSERTION PORT-A-CATH (catheter in left subclavian);  Surgeon: Aviva Signs, MD;  Location: [facility];  Service: General;  Laterality: Left;  . UMBILICAL HERNIA REPAIR N/A 02/07/2015   Procedure: UMBILICAL HERNIORRHAPHY WITH MESH;  Surgeon: Aviva Signs Md, MD;  Location: [facility];  Service: General;  Laterality: N/A;     SOCIAL HISTORY:  Social History   Socioeconomic History  . Marital status: Married    Spouse name: Not on file  . Number of children: 3  . Years of education: Not on file  . Highest education level: Not on file  Occupational History  . 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 Financial resource strain: Not hard at all  . Food insecurity:    Worry: Never true    Inability: Never true  . Transportation needs:    Medical: No    Non-medical: No  Tobacco Use  . Smoking status: Never Smoker  . Smokeless tobacco: Never Used  Substance and Sexual Activity  . Alcohol use: No  . Drug use: No  . Sexual activity: Yes    Birth control/protection: None  Lifestyle  . Physical activity:    Days per week: 0 days    Minutes per session: 0 min  . Stress: Not at all  Relationships  . Social connections:    Talks on phone: More than three times a week    Gets together: More than three times a week    Attends religious service: Never    Active member of club or organization: No    Attends meetings of clubs or organizations: Never    Relationship status: Married  . Intimate partner violence:    Fear of current or ex partner: No    Emotionally abused: No    Physically abused: No    Forced sexual  activity: No  Other Topics Concern  . Not on file  Social History Narrative  . Not on file    FAMILY HISTORY:  Family History  Problem Relation Age of Onset  . Hypertension Father   . Diabetes Father   . Heart disease Father   . Diabetes Sister   . Hypertension Sister   . Hypertension Brother   . Diabetes Brother   . Diabetes Sister   . Diabetes Brother   . Heart disease Brother   . Lymphoma Brother   . Hypertension Son     CURRENT MEDICATIONS:  Outpatient Encounter Medications as of 12/01/2018  Medication Sig Note  . amLODipine (NORVASC) 10 MG tablet Take 1 tablet (10 mg total) by mouth daily.   Marland Kitchen aspirin EC 81 MG tablet Take 81 mg by mouth daily.   . Bevacizumab (AVASTIN IV) Inject 450 mg into the vein every 14 (fourteen) days.   . Coenzyme Q10 (COQ10)  100 MG CAPS Take 100 mg by mouth daily.   Marland Kitchen diltiazem (CARDIZEM CD) 240 MG 24 hr capsule Take 240 mg by mouth daily.   . diphenhydramine-acetaminophen (TYLENOL PM) 25-500 MG TABS tablet Take 2 tablets by mouth at bedtime.   Marland Kitchen ezetimibe-simvastatin (VYTORIN) 10-80 MG per tablet Take 1 tablet by mouth at bedtime.   . hydrochlorothiazide (MICROZIDE) 12.5 MG capsule Take 12.5 mg by mouth daily.   . insulin glargine (LANTUS SOLOSTAR) 100 UNIT/ML injection Inject 20 Units into the skin at bedtime.  10/10/2018: Took 10 units  . insulin lispro protamine-lispro (HUMALOG 75/25 MIX) (75-25) 100 UNIT/ML SUSP injection Inject 16 Units into the skin 3 (three) times daily. 10/10/2018: 1/2 dose  . IRINOTECAN HCL IV Inject 360 mg into the vein every 14 (fourteen) days.   Marland Kitchen LEUCOVORIN CALCIUM IV Inject 808 mg into the vein every 14 (fourteen) days.   Marland Kitchen lidocaine-prilocaine (EMLA) cream Apply small amount over port site one hour prior to appointment. Cover with plastic wrap.   . loperamide (IMODIUM A-D) 2 MG tablet Take 2 capsules after first loose stool and 1 capsule after each loose stool thereafter.   . metFORMIN (GLUCOPHAGE) 1000 MG tablet  Take 1,000 mg by mouth 2 (two) times daily.    . metoprolol succinate (TOPROL-XL) 50 MG 24 hr tablet Take 75 mg by mouth 2 (two) times daily.   . niacin (NIASPAN) 500 MG CR tablet Take 500 mg by mouth at bedtime.   . Omega-3 Fatty Acids (FISH OIL PO) Take 1 capsule by mouth daily.   . pantoprazole (PROTONIX) 40 MG tablet Take 40 mg by mouth daily.   . prochlorperazine (COMPAZINE) 10 MG tablet Take 1 tablet (10 mg total) by mouth every 6 (six) hours as needed (NAUSEA).   . ramipril (ALTACE) 10 MG capsule Take 10 mg by mouth daily.   . sodium chloride 0.9 % SOLN with fluorouracil 5 GM/100ML SOLN 200 mg/m2/day Inject 5,650 mg into the vein. Over 46 hours   . [DISCONTINUED] amLODipine (NORVASC) 5 MG tablet Take 1 tablet (5 mg total) by mouth daily.   . [DISCONTINUED] INVOKANA 300 MG TABS tablet Take 300 mg by mouth daily.   . [DISCONTINUED] TRADJENTA 5 MG TABS tablet Take 5 mg by mouth daily.     No facility-administered encounter medications on file as of 12/01/2018.     ALLERGIES:  Allergies  Allergen Reactions  . Anectine [Succinylcholine] Other (See Comments)    Atypical pseudocholinesterase deficiency.      PHYSICAL EXAM:  ECOG Performance status: 1  Vitals:   12/01/18 0900  BP: (!) 176/73  Pulse: 85  Resp: 16  Temp: 98.2 F (36.8 C)  SpO2: 97%   Filed Weights   12/01/18 0900  Weight: 208 lb 5 oz (94.5 kg)    Physical Exam Constitutional:      Appearance: Normal appearance. He is normal weight.  Cardiovascular:     Rate and Rhythm: Normal rate and regular rhythm.     Heart sounds: Normal heart sounds.  Pulmonary:     Effort: Pulmonary effort is normal.     Breath sounds: Normal breath sounds.  Musculoskeletal: Normal range of motion.  Skin:    General: Skin is warm and dry.  Neurological:     Mental Status: He is alert and oriented to person, place, and time. Mental status is at baseline.  Psychiatric:        Mood and Affect: Mood normal.  Behavior:  Behavior normal.        Thought Content: Thought content normal.        Judgment: Judgment normal.      LABORATORY DATA:  I have reviewed the labs as listed.  CBC    Component Value Date/Time   WBC 7.8 12/01/2018 0853   RBC 4.23 12/01/2018 0853   HGB 11.9 (L) 12/01/2018 0853   HCT 37.3 (L) 12/01/2018 0853   PLT 283 12/01/2018 0853   MCV 88.2 12/01/2018 0853   MCH 28.1 12/01/2018 0853   MCHC 31.9 12/01/2018 0853   RDW 14.2 12/01/2018 0853   LYMPHSABS 2.7 12/01/2018 0853   MONOABS 0.6 12/01/2018 0853   EOSABS 0.2 12/01/2018 0853   BASOSABS 0.0 12/01/2018 0853   CMP Latest Ref Rng & Units 12/01/2018 11/17/2018 11/03/2018  Glucose 70 - 99 mg/dL 211(H) 178(H) 216(H)  BUN 8 - 23 mg/dL 19 22 27(H)  Creatinine 0.61 - 1.24 mg/dL 1.02 1.02 1.37(H)  Sodium 135 - 145 mmol/L 139 140 138  Potassium 3.5 - 5.1 mmol/L 3.9 3.7 3.7  Chloride 98 - 111 mmol/L 106 108 104  CO2 22 - 32 mmol/L _0 Calcium 8.9 - 10.3 mg/dL 9.5 9.3 9.5  Total Protein 6.5 - 8.1 g/dL 7.1 7.1 7.8  Total Bilirubin 0.3 - 1.2 mg/dL 0.4 0.4 0.7  Alkaline Phos 38 - 126 U/L 74 68 73  AST 15 - 41 U/L _1 ALT 0 - 44 U/L _2 DIAGNOSTIC IMAGING:  I have independently reviewed the scans and discussed with the patient.   I have reviewed Francene Finders, NP's note and agree with the documentation.  I personally performed a face-to-face visit, made revisions and my assessment and plan is as follows.    ASSESSMENT & PLAN:   Malignant neoplasm of sigmoid colon ([place]) 1.  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  Pathology was consistent with invasive moderately differentiated adenocarcinoma arising in a tubular adenoma with tumor invading the submucosa with no lymphovascular invasion.   Cauterized resection margin is positive for tumor. -Preoperative CEA on 09/12/2018 was 46.5. - Sigmoid colon segmental resection on 09/17/2018 showing no residual carcinoma in the sigmoid colon, margins uninvolved, 3 tumor deposits present and 1/12 lymph nodes positive.  Liver biopsy was consistent with metastatic carcinoma. - He never had any history of bleeding per rectum or melena.  No family history of colorectal cancer.  Brother died of lymphoma. - MRI of the liver on 10/21/2018 shows complex but nonenhancing lesion at the prior wedge biopsy site measuring 3.1 x 2.3 cm, with a complex cystic cavity with fluid fluid level. - CT CAP shows small nodule in the right upper lobe of the lung measuring 5 mm.  Small amount of soft tissue at the anastomotic site likely postsurgical in etiology. - PET CT scan on 10/30/2018 did not show any distant metastatic disease other than in the liver.   - He received FOLFIRI and bevacizumab cycle 1 on 11/03/2018. -Cycle 2 was on 11/18/2018. - He had nausea, vomiting and diarrhea soon after his second cycle.  Only lasted 1 day. - We have added atropine to premedication today.  I will also add Emend to the regimen along with Ativan. -  He may proceed with cycle 3 today.  We have reviewed his blood work. - I plan to repeat his scans to evaluate response in 2 to 3 months.  2.  Peripheral neuropathy: -he has grade 1 peripheral neuropathy in the feet from his longstanding diabetes.  Hence we are not giving oxaliplatin based regimen at this time.  3.  Hypertension: - He is currently on Altace 10 mg daily and HCTZ 12.5 mg daily. -We have started him on Norvasc 5 mg daily.  His systolic blood pressure today is 176.  We will treated today.  I have increased Norvasc to 10 mg daily.      Orders placed this encounter:  Orders Placed This Encounter  Procedures  . CEA  . CBC with Differential/Platelet  . Comprehensive metabolic panel  . Urinalysis, Routine w reflex  microscopic      Derek Jack, MD [place] [phone]

## 2018-12-01 NOTE — Patient Instructions (Signed)
[facility] at [facility] Discharge Instructions   Follow up in 2 weeks with labs and treatment  Thank you for choosing [facility] at [facility] to provide your oncology and hematology care.  To afford each patient quality time with our provider, please arrive at least 15 minutes before your scheduled appointment time.   If you have a lab appointment with the Cancer Center please come in thru the  Main Entrance and check in at the main information desk  You need to re-schedule your appointment should you arrive 10 or more minutes late.  We strive to give you quality time with our providers, and arriving late affects you and other patients whose appointments are after yours.  Also, if you no show three or more times for appointments you may be dismissed from the clinic at the providers discretion.     Again, thank you for choosing [facility].  Our hope is that these requests will decrease the amount of time that you wait before being seen by our physicians.       _____________________________________________________________  Should you have questions after your visit to [facility], please contact our office at [phone] between the hours of 8:00 a.m. and 4:30 p.m.  Voicemails left after 4:00 p.m. will not be returned until the following business day.  For prescription refill requests, have your pharmacy contact our office and allow 72 hours.    Cancer Center Support Programs:   > Cancer Support Group  2nd Tuesday of the month 1pm-2pm, Journey Room    

## 2018-12-02 MED ORDER — HEPARIN SOD (PORK) LOCK FLUSH 100 UNIT/ML IV SOLN
INTRAVENOUS | Status: AC
  Filled 2018-12-02: qty 5

## 2018-12-02 MED ORDER — LIDOCAINE HCL (PF) 1 % IJ SOLN
INTRAMUSCULAR | Status: AC
  Filled 2018-12-02: qty 5

## 2018-12-03 ENCOUNTER — Inpatient Hospital Stay (HOSPITAL_COMMUNITY): Payer: 59

## 2018-12-03 VITALS — BP 138/61 | HR 78 | Temp 97.6°F | Resp 18

## 2018-12-03 DIAGNOSIS — C187 Malignant neoplasm of sigmoid colon: Secondary | ICD-10-CM

## 2018-12-03 DIAGNOSIS — Z5112 Encounter for antineoplastic immunotherapy: Secondary | ICD-10-CM | POA: Diagnosis not present

## 2018-12-03 DIAGNOSIS — R160 Hepatomegaly, not elsewhere classified: Secondary | ICD-10-CM

## 2018-12-03 MED ORDER — SODIUM CHLORIDE 0.9% FLUSH
10.0000 mL | INTRAVENOUS | Status: DC | PRN
  Administered 2018-12-03: 10 mL
  Filled 2018-12-03: qty 10

## 2018-12-03 MED ORDER — HEPARIN SOD (PORK) LOCK FLUSH 100 UNIT/ML IV SOLN
500.0000 [IU] | Freq: Once | INTRAVENOUS | Status: AC | PRN
  Administered 2018-12-03: 500 [IU]

## 2018-12-03 NOTE — Progress Notes (Signed)
Ian [name] returns today for port de access and flush after 46 hr continous infusion of 8fu. Tolerated infusion without problems. Portacath located left chest wall was  deaccessed and flushed with 87ml NS and 500U/77ml Heparin and needle removed intact.  Procedure without incident. Patient tolerated procedure well.   discharged home from clinic ambulatory. Follow up as scheduled.

## 2018-12-15 ENCOUNTER — Inpatient Hospital Stay (HOSPITAL_COMMUNITY): Payer: 59

## 2018-12-15 ENCOUNTER — Encounter (HOSPITAL_COMMUNITY): Payer: Self-pay | Admitting: Hematology

## 2018-12-15 ENCOUNTER — Inpatient Hospital Stay (HOSPITAL_BASED_OUTPATIENT_CLINIC_OR_DEPARTMENT_OTHER): Payer: 59 | Admitting: Hematology

## 2018-12-15 VITALS — BP 122/54 | HR 67 | Temp 97.7°F | Resp 18

## 2018-12-15 DIAGNOSIS — C187 Malignant neoplasm of sigmoid colon: Secondary | ICD-10-CM

## 2018-12-15 DIAGNOSIS — C787 Secondary malignant neoplasm of liver and intrahepatic bile duct: Secondary | ICD-10-CM | POA: Diagnosis not present

## 2018-12-15 DIAGNOSIS — I10 Essential (primary) hypertension: Secondary | ICD-10-CM

## 2018-12-15 DIAGNOSIS — Z5112 Encounter for antineoplastic immunotherapy: Secondary | ICD-10-CM | POA: Diagnosis not present

## 2018-12-15 DIAGNOSIS — R160 Hepatomegaly, not elsewhere classified: Secondary | ICD-10-CM

## 2018-12-15 DIAGNOSIS — E1142 Type 2 diabetes mellitus with diabetic polyneuropathy: Secondary | ICD-10-CM

## 2018-12-15 LAB — CBC WITH DIFFERENTIAL/PLATELET
Abs Immature Granulocytes: 0.05 10*3/uL (ref 0.00–0.07)
Basophils Absolute: 0.1 10*3/uL (ref 0.0–0.1)
Basophils Relative: 1 %
Eosinophils Absolute: 0.1 10*3/uL (ref 0.0–0.5)
Eosinophils Relative: 1 %
HCT: 41.2 % (ref 39.0–52.0)
Hemoglobin: 13.3 g/dL (ref 13.0–17.0)
Immature Granulocytes: 1 %
Lymphocytes Relative: 29 %
Lymphs Abs: 2.4 10*3/uL (ref 0.7–4.0)
MCH: 28.3 pg (ref 26.0–34.0)
MCHC: 32.3 g/dL (ref 30.0–36.0)
MCV: 87.7 fL (ref 80.0–100.0)
Monocytes Absolute: 0.9 10*3/uL (ref 0.1–1.0)
Monocytes Relative: 11 %
NRBC: 0 % (ref 0.0–0.2)
Neutro Abs: 4.9 10*3/uL (ref 1.7–7.7)
Neutrophils Relative %: 57 %
Platelets: 213 10*3/uL (ref 150–400)
RBC: 4.7 MIL/uL (ref 4.22–5.81)
RDW: 15.6 % — AB (ref 11.5–15.5)
WBC: 8.4 10*3/uL (ref 4.0–10.5)

## 2018-12-15 LAB — COMPREHENSIVE METABOLIC PANEL
ALT: 29 U/L (ref 0–44)
AST: 25 U/L (ref 15–41)
Albumin: 4 g/dL (ref 3.5–5.0)
Alkaline Phosphatase: 83 U/L (ref 38–126)
Anion gap: 11 (ref 5–15)
BUN: 15 mg/dL (ref 8–23)
CO2: 23 mmol/L (ref 22–32)
Calcium: 9.5 mg/dL (ref 8.9–10.3)
Chloride: 106 mmol/L (ref 98–111)
Creatinine, Ser: 0.94 mg/dL (ref 0.61–1.24)
GFR calc Af Amer: >60 mL/min (ref >60)
Glucose, Bld: 185 mg/dL — ABNORMAL HIGH (ref 70–99)
Potassium: 4.2 mmol/L (ref 3.5–5.1)
Sodium: 140 mmol/L (ref 135–145)
Total Bilirubin: 0.5 mg/dL (ref 0.3–1.2)
Total Protein: 7.3 g/dL (ref 6.5–8.1)

## 2018-12-15 MED ORDER — HEPARIN SOD (PORK) LOCK FLUSH 100 UNIT/ML IV SOLN
INTRAVENOUS | Status: AC
  Filled 2018-12-15: qty 5

## 2018-12-15 MED ORDER — CYANOCOBALAMIN 1000 MCG/ML IJ SOLN
INTRAMUSCULAR | Status: AC
  Filled 2018-12-15: qty 1

## 2018-12-15 MED ORDER — PALONOSETRON HCL INJECTION 0.25 MG/5ML
0.2500 mg | Freq: Once | INTRAVENOUS | Status: AC
  Administered 2018-12-15: 0.25 mg via INTRAVENOUS
  Filled 2018-12-15: qty 5

## 2018-12-15 MED ORDER — SODIUM CHLORIDE 0.9 % IV SOLN
Freq: Once | INTRAVENOUS | Status: AC
  Administered 2018-12-15: 09:00:00 via INTRAVENOUS

## 2018-12-15 MED ORDER — SODIUM CHLORIDE 0.9 % IV SOLN
5.0000 mg/kg | Freq: Once | INTRAVENOUS | Status: AC
  Administered 2018-12-15: 450 mg via INTRAVENOUS
  Filled 2018-12-15: qty 16

## 2018-12-15 MED ORDER — FLUOROURACIL CHEMO INJECTION 2.5 GM/50ML
400.0000 mg/m2 | Freq: Once | INTRAVENOUS | Status: AC
  Administered 2018-12-15: 800 mg via INTRAVENOUS
  Filled 2018-12-15: qty 16

## 2018-12-15 MED ORDER — HEPARIN SOD (PORK) LOCK FLUSH 100 UNIT/ML IV SOLN
500.0000 [IU] | Freq: Once | INTRAVENOUS | Status: DC | PRN
  Filled 2018-12-15: qty 5

## 2018-12-15 MED ORDER — OCTREOTIDE ACETATE 30 MG IM KIT
PACK | INTRAMUSCULAR | Status: AC
  Filled 2018-12-15: qty 1

## 2018-12-15 MED ORDER — LORAZEPAM 2 MG/ML IJ SOLN
1.0000 mg | Freq: Once | INTRAMUSCULAR | Status: AC
  Administered 2018-12-15: 1 mg via INTRAVENOUS
  Filled 2018-12-15: qty 1

## 2018-12-15 MED ORDER — SODIUM CHLORIDE 0.9% FLUSH
10.0000 mL | INTRAVENOUS | Status: DC | PRN
  Administered 2018-12-15: 10 mL
  Filled 2018-12-15: qty 10

## 2018-12-15 MED ORDER — SODIUM CHLORIDE 0.9 % IV SOLN
2400.0000 mg/m2 | INTRAVENOUS | Status: DC
  Administered 2018-12-15: 4850 mg via INTRAVENOUS
  Filled 2018-12-15: qty 97

## 2018-12-15 MED ORDER — EPOETIN ALFA 40000 UNIT/ML IJ SOLN
INTRAMUSCULAR | Status: AC
  Filled 2018-12-15: qty 1

## 2018-12-15 MED ORDER — ATROPINE SULFATE 1 MG/ML IJ SOLN
1.0000 mg | Freq: Once | INTRAMUSCULAR | Status: AC
  Administered 2018-12-15: 1 mg via INTRAVENOUS
  Filled 2018-12-15: qty 1

## 2018-12-15 MED ORDER — SODIUM CHLORIDE 0.9 % IV SOLN
Freq: Once | INTRAVENOUS | Status: AC
  Administered 2018-12-15: 09:00:00 via INTRAVENOUS
  Filled 2018-12-15: qty 5

## 2018-12-15 MED ORDER — LEUCOVORIN CALCIUM INJECTION 350 MG
800.0000 mg | Freq: Once | INTRAVENOUS | Status: AC
  Administered 2018-12-15: 800 mg via INTRAVENOUS
  Filled 2018-12-15: qty 10

## 2018-12-15 MED ORDER — IRINOTECAN HCL CHEMO INJECTION 100 MG/5ML
180.0000 mg/m2 | Freq: Once | INTRAVENOUS | Status: AC
  Administered 2018-12-15: 360 mg via INTRAVENOUS
  Filled 2018-12-15: qty 15

## 2018-12-15 NOTE — Progress Notes (Signed)
Patient seen by the oncologist with lab review.  Ok to treat today verbal order Francene Finders, NP.    Patient tolerated treatment with no complaints voiced.  Port site clean and dry with dressing intact.  No bruising or swelling noted at site.  Chemotherapy pump connected with no alarms noted.  VSs with discharge and left ambulatory with wife with no s/s of distress noted.

## 2018-12-15 NOTE — Patient Instructions (Signed)
[facility] Discharge Instructions for Patients Receiving Chemotherapy  Today you received the following chemotherapy agents  If you develop nausea and vomiting that is not controlled by your nausea medication, call the clinic.   BELOW ARE SYMPTOMS THAT SHOULD BE REPORTED IMMEDIATELY:  *FEVER GREATER THAN 100.5 F  *CHILLS WITH OR WITHOUT FEVER  NAUSEA AND VOMITING THAT IS NOT CONTROLLED WITH YOUR NAUSEA MEDICATION  *UNUSUAL SHORTNESS OF BREATH  *UNUSUAL BRUISING OR BLEEDING  TENDERNESS IN MOUTH AND THROAT WITH OR WITHOUT PRESENCE OF ULCERS  *URINARY PROBLEMS  *BOWEL PROBLEMS  UNUSUAL RASH Items with * indicate a potential emergency and should be followed up as soon as possible.  Feel free to call the clinic should you have any questions or concerns. The clinic phone number is [phone].  Please show the CHEMO ALERT CARD at check-in to the Emergency Department and triage nurse.   

## 2018-12-15 NOTE — Progress Notes (Signed)
Patient in rrom 413 is ready for treatment. Labs reviewed.

## 2018-12-15 NOTE — Progress Notes (Signed)
[place], [place] [address]   CLINIC:  Medical Oncology/Hematology  PCP:  Sharilyn Sites, MD [address] [place] [address] [phone]   REASON FOR VISIT: Follow-up for Metastatic sigmoid colon cancer to the liver  CURRENT THERAPY:Folfiri and bevacizumab (AVASTIN)  BRIEF ONCOLOGIC HISTORY:    Malignant neoplasm of sigmoid colon ([place])   09/17/2018 Initial Diagnosis    Malignant neoplasm of sigmoid colon ([place])    11/03/2018 -  Chemotherapy    The patient had palonosetron (ALOXI) injection 0.25 mg, 0.25 mg, Intravenous,  Once, 4 of 6 cycles Administration: 0.25 mg (11/03/2018), 0.25 mg (11/18/2018), 0.25 mg (12/01/2018) bevacizumab (AVASTIN) 450 mg in sodium chloride 0.9 % 100 mL chemo infusion, 5 mg/kg = 450 mg, Intravenous,  Once, 4 of 6 cycles Administration: 450 mg (11/03/2018), 450 mg (11/18/2018), 450 mg (12/01/2018) irinotecan (CAMPTOSAR) 360 mg in sodium chloride 0.9 % 500 mL chemo infusion, 180 mg/m2 = 360 mg, Intravenous,  Once, 4 of 6 cycles Administration: 360 mg (11/03/2018), 360 mg (11/18/2018), 360 mg (12/01/2018) leucovorin 800 mg in sodium chloride 0.9 % 250 mL infusion, 808 mg, Intravenous,  Once, 4 of 6 cycles Administration: 800 mg (11/03/2018), 800 mg (11/18/2018), 800 mg (12/01/2018) fosaprepitant (EMEND) 150 mg in sodium chloride 0.9 % 145 mL IVPB, 150 mg, Intravenous,  Once, 1 of 1 cycle Administration: 150 mg (12/01/2018) fluorouracil (ADRUCIL) chemo injection 800 mg, 400 mg/m2 = 800 mg, Intravenous,  Once, 4 of 6 cycles Administration: 800 mg (11/03/2018), 800 mg (11/18/2018), 800 mg (12/01/2018) fosaprepitant (EMEND) 150 mg, dexamethasone (DECADRON) 10 mg in sodium chloride 0.9 % 145 mL IVPB, , Intravenous,  Once, 1 of 3 cycles fluorouracil (ADRUCIL) 4,850 mg in sodium chloride 0.9 % 53 mL chemo infusion, 2,400 mg/m2 = 4,850 mg, Intravenous, 1 Day/Dose, 4 of 6 cycles Administration: 4,850 mg (11/03/2018),  4,850 mg (11/18/2018), 4,850 mg (12/01/2018)  for chemotherapy treatment.       INTERVAL HISTORY:  Mr. Dai 63 y.o. male returns for routine follow-up for metastatic sigmoid colon cancer to the liver. He is here today with his wife. He is doing well with his treatments. He reports he had one small episode of blood in his stool. He reports this is not new it has happened to him prior to treatment. He reports he felt better with this last treatment. Denies any nausea, vomiting, or diarrhea. Denies any new pains. Had not noticed any recent bleeding such as epistaxis or hematuria. Denies recent chest pain on exertion, shortness of breath on minimal exertion, pre-syncopal episodes, or palpitations. Denies any numbness or tingling in hands or feet. Denies any recent fevers, infections, or recent hospitalizations. Patient reports appetite at 100% and energy level at 75%. He has no problems maintaining his weight.    REVIEW OF SYSTEMS:  Review of Systems  Psychiatric/Behavioral: The patient is nervous/anxious.   All other systems reviewed and are negative.    PAST MEDICAL/SURGICAL HISTORY:  Past Medical History:  Diagnosis Date  . 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 Colon cancer ([place])   . Complication of anesthesia    atypical pseudo-cholinesterase deficiency  . Diabetes mellitus without complication ([place])   . Family history of adverse reaction to anesthesia    sister also has atypical pseudo-cholinesterase deficiency  . GERD (gastroesophageal reflux disease)   . History of heart bypass surgery   . Hypertension    Past Surgical History:  Procedure Laterality Date  . COLONOSCOPY N/A 08/29/2018  Procedure: COLONOSCOPY;  Surgeon: Daneil Dolin, MD;  Location: [facility];  Service: Endoscopy;  Laterality: N/A;  9:30  . CORONARY ARTERY BYPASS GRAFT     4 vessels  . heart bypass    . INSERTION OF MESH  02/07/2015   Procedure: INSERTION OF MESH;  Surgeon: Aviva Signs Md, MD;   Location: [facility] ORS;  Service: General;;  . PARTIAL COLECTOMY N/A 09/17/2018   Procedure: PARTIAL COLECTOMY WITH PARTIAL WEDGE RESECTION LIVER METASTASIS;  Surgeon: Aviva Signs, MD;  Location: [facility] ORS;  Service: General;  Laterality: N/A;  . POLYPECTOMY  08/29/2018   Procedure: POLYPECTOMY;  Surgeon: Daneil Dolin, MD;  Location: [facility];  Service: Endoscopy;;  . PORTACATH PLACEMENT Left 10/10/2018   Procedure: INSERTION PORT-A-CATH (catheter in left subclavian);  Surgeon: Aviva Signs, MD;  Location: [facility];  Service: General;  Laterality: Left;  . UMBILICAL HERNIA REPAIR N/A 02/07/2015   Procedure: UMBILICAL HERNIORRHAPHY WITH MESH;  Surgeon: Aviva Signs Md, MD;  Location: [facility];  Service: General;  Laterality: N/A;     SOCIAL HISTORY:  Social History   Socioeconomic History  . Marital status: Married    Spouse name: Not on file  . Number of children: 3  . Years of education: Not on file  . Highest education level: Not on file  Occupational History  . 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 Financial resource strain: Not hard at all  . Food insecurity:    Worry: Never true    Inability: Never true  . Transportation needs:    Medical: No    Non-medical: No  Tobacco Use  . Smoking status: Never Smoker  . Smokeless tobacco: Never Used  Substance and Sexual Activity  . Alcohol use: No  . Drug use: No  . Sexual activity: Yes    Birth control/protection: None  Lifestyle  . Physical activity:    Days per week: 0 days    Minutes per session: 0 min  . Stress: Not at all  Relationships  . Social connections:    Talks on phone: More than three times a week    Gets together: More than three times a week    Attends religious service: Never    Active member of club or organization: No    Attends meetings of clubs or organizations: Never    Relationship status: Married  . Intimate partner violence:    Fear of current or ex  partner: No    Emotionally abused: No    Physically abused: No    Forced sexual activity: No  Other Topics Concern  . Not on file  Social History Narrative  . Not on file    FAMILY HISTORY:  Family History  Problem Relation Age of Onset  . Hypertension Father   . Diabetes Father   . Heart disease Father   . Diabetes Sister   . Hypertension Sister   . Hypertension Brother   . Diabetes Brother   . Diabetes Sister   . Diabetes Brother   . Heart disease Brother   . Lymphoma Brother   . Hypertension Son     CURRENT MEDICATIONS:  Outpatient Encounter Medications as of 12/15/2018  Medication Sig Note  . amLODipine (NORVASC) 10 MG tablet Take 1 tablet (10 mg total) by mouth daily.   Marland Kitchen aspirin EC 81 MG tablet Take 81 mg by mouth daily.   . Bevacizumab (AVASTIN IV) Inject 450  mg into the vein every 14 (fourteen) days.   . Coenzyme Q10 (COQ10) 100 MG CAPS Take 100 mg by mouth daily.   Marland Kitchen diltiazem (CARDIZEM CD) 240 MG 24 hr capsule Take 240 mg by mouth daily.   . diphenhydramine-acetaminophen (TYLENOL PM) 25-500 MG TABS tablet Take 2 tablets by mouth at bedtime.   Marland Kitchen ezetimibe-simvastatin (VYTORIN) 10-80 MG per tablet Take 1 tablet by mouth at bedtime.   . hydrochlorothiazide (MICROZIDE) 12.5 MG capsule Take 12.5 mg by mouth daily.   . insulin glargine (LANTUS SOLOSTAR) 100 UNIT/ML injection Inject 20 Units into the skin at bedtime.  10/10/2018: Took 10 units  . insulin lispro protamine-lispro (HUMALOG 75/25 MIX) (75-25) 100 UNIT/ML SUSP injection Inject 16 Units into the skin 3 (three) times daily. 10/10/2018: 1/2 dose  . IRINOTECAN HCL IV Inject 360 mg into the vein every 14 (fourteen) days.   Marland Kitchen LEUCOVORIN CALCIUM IV Inject 808 mg into the vein every 14 (fourteen) days.   Marland Kitchen lidocaine-prilocaine (EMLA) cream Apply small amount over port site one hour prior to appointment. Cover with plastic wrap.   . loperamide (IMODIUM A-D) 2 MG tablet Take 2 capsules after first loose stool and 1  capsule after each loose stool thereafter.   . metFORMIN (GLUCOPHAGE) 1000 MG tablet Take 1,000 mg by mouth 2 (two) times daily.    . metoprolol succinate (TOPROL-XL) 50 MG 24 hr tablet Take 75 mg by mouth 2 (two) times daily.   . niacin (NIASPAN) 500 MG CR tablet Take 500 mg by mouth at bedtime.   . Omega-3 Fatty Acids (FISH OIL PO) Take 1 capsule by mouth daily.   . pantoprazole (PROTONIX) 40 MG tablet Take 40 mg by mouth daily.   . prochlorperazine (COMPAZINE) 10 MG tablet Take 1 tablet (10 mg total) by mouth every 6 (six) hours as needed (NAUSEA).   . ramipril (ALTACE) 10 MG capsule Take 10 mg by mouth daily.   . sodium chloride 0.9 % SOLN with fluorouracil 5 GM/100ML SOLN 200 mg/m2/day Inject 5,650 mg into the vein. Over 46 hours    No facility-administered encounter medications on file as of 12/15/2018.     ALLERGIES:  Allergies  Allergen Reactions  . Anectine [Succinylcholine] Other (See Comments)    Atypical pseudocholinesterase deficiency.      PHYSICAL EXAM:  ECOG Performance status: 1  Vitals:   12/15/18 0842  BP: (!) 167/79  Pulse: 82  Resp: 18  Temp: (!) 97.5 F (36.4 C)  SpO2: 100%   Filed Weights   12/15/18 0842  Weight: 207 lb 11.2 oz (94.2 kg)    Physical Exam Constitutional:      Appearance: Normal appearance. He is normal weight.  Cardiovascular:     Rate and Rhythm: Normal rate and regular rhythm.     Heart sounds: Normal heart sounds.  Pulmonary:     Effort: Pulmonary effort is normal.     Breath sounds: Normal breath sounds.  Abdominal:     General: Abdomen is flat.     Palpations: Abdomen is soft.  Musculoskeletal: Normal range of motion.  Skin:    General: Skin is warm and dry.  Neurological:     Mental Status: He is alert and oriented to person, place, and time. Mental status is at baseline.  Psychiatric:        Mood and Affect: Mood normal.        Behavior: Behavior normal.        Thought Content: Thought content  normal.         Judgment: Judgment normal.      LABORATORY DATA:  I have reviewed the labs as listed.  CBC    Component Value Date/Time   WBC 8.4 12/15/2018 0821   RBC 4.70 12/15/2018 0821   HGB 13.3 12/15/2018 0821   HCT 41.2 12/15/2018 0821   PLT 213 12/15/2018 0821   MCV 87.7 12/15/2018 0821   MCH 28.3 12/15/2018 0821   MCHC 32.3 12/15/2018 0821   RDW 15.6 (H) 12/15/2018 0821   LYMPHSABS 2.4 12/15/2018 0821   MONOABS 0.9 12/15/2018 0821   EOSABS 0.1 12/15/2018 0821   BASOSABS 0.1 12/15/2018 0821   CMP Latest Ref Rng & Units 12/15/2018 12/01/2018 11/17/2018  Glucose 70 - 99 mg/dL 185(H) 211(H) 178(H)  BUN 8 - 23 mg/dL '15 19 22  ' Creatinine 0.61 - 1.24 mg/dL 0.94 1.02 1.02  Sodium 135 - 145 mmol/L 140 139 140  Potassium 3.5 - 5.1 mmol/L 4.2 3.9 3.7  Chloride 98 - 111 mmol/L 106 106 108  CO2 22 - 32 mmol/L '23 23 22  ' Calcium 8.9 - 10.3 mg/dL 9.5 9.5 9.3  Total Protein 6.5 - 8.1 g/dL 7.3 7.1 7.1  Total Bilirubin 0.3 - 1.2 mg/dL 0.5 0.4 0.4  Alkaline Phos 38 - 126 U/L 83 74 68  AST 15 - 41 U/L '25 21 19  ' ALT 0 - 44 U/L '29 21 19       ' DIAGNOSTIC IMAGING:  I have independently reviewed the scans and discussed with the patient.   I have reviewed Francene Finders, NP's note and agree with the documentation.  I personally performed a face-to-face visit, made revisions and my assessment and plan is as follows.    ASSESSMENT & PLAN:   Malignant neoplasm of sigmoid colon ([place]) 1.  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  Pathology was consistent with invasive moderately differentiated adenocarcinoma arising in a tubular adenoma with tumor invading the submucosa with no lymphovascular invasion.  Cauterized resection margin is positive for tumor. -Preoperative CEA on 09/12/2018  was 46.5. - Sigmoid colon segmental resection on 09/17/2018 showing no residual carcinoma in the sigmoid colon, margins uninvolved, 3 tumor deposits present and 1/12 lymph nodes positive.  Liver biopsy was consistent with metastatic carcinoma. - He never had any history of bleeding per rectum or melena.  No family history of colorectal cancer.  Brother died of lymphoma. - MRI of the liver on 10/21/2018 shows complex but nonenhancing lesion at the prior wedge biopsy site measuring 3.1 x 2.3 cm, with a complex cystic cavity with fluid fluid level. - CT CAP shows small nodule in the right upper lobe of the lung measuring 5 mm.  Small amount of soft tissue at the anastomotic site likely postsurgical in etiology. - PET CT scan on 10/30/2018 did not show any distant metastatic disease other than in the liver.   - FOLFIRI and bevacizumab cycle 1 started on 11/03/2018, CEA level 2.3. - Developed nausea, vomiting and diarrhea after cycle 2.  We have added Emend and atropine to the premeds. -Cycle 3 on 12/01/2018, tolerated very well.  He had mild nausea but denied any vomiting. -he had one episode of blood on the tissue paper and reports that history of hemorrhoids. -We reviewed his CBC  which is adequate to proceed with cycle 4.  I plan to repeat MRI of the liver and CT of the chest after cycle 6. -he will come back in 2 weeks for his next cycle and 4 weeks to see me.  2.  Peripheral neuropathy: -He has grade 1 peripheral neuropathy in the feet from longstanding diabetes. -Has which was Irinotecan based regimen.  3.  Hypertension: - he will continue Altace 10 mg daily and HCTZ 12.5 mg daily.  He will also continue Norvasc 10 mg daily.        Orders placed this encounter:  No orders of the defined types were placed in this encounter.     Derek Jack, MD [place] [phone]

## 2018-12-15 NOTE — Patient Instructions (Signed)
[facility] at [facility] Discharge Instructions     Thank you for choosing [facility] at [facility] to provide your oncology and hematology care.  To afford each patient quality time with our provider, please arrive at least 15 minutes before your scheduled appointment time.   If you have a lab appointment with the Cancer Center please come in thru the  Main Entrance and check in at the main information desk  You need to re-schedule your appointment should you arrive 10 or more minutes late.  We strive to give you quality time with our providers, and arriving late affects you and other patients whose appointments are after yours.  Also, if you no show three or more times for appointments you may be dismissed from the clinic at the providers discretion.     Again, thank you for choosing [facility].  Our hope is that these requests will decrease the amount of time that you wait before being seen by our physicians.       _____________________________________________________________  Should you have questions after your visit to [facility], please contact our office at [phone] between the hours of 8:00 a.m. and 4:30 p.m.  Voicemails left after 4:00 p.m. will not be returned until the following business day.  For prescription refill requests, have your pharmacy contact our office and allow 72 hours.    Cancer Center Support Programs:   > Cancer Support Group  2nd Tuesday of the month 1pm-2pm, Journey Room    

## 2018-12-15 NOTE — Assessment & Plan Note (Signed)
1.  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  Pathology was consistent with invasive moderately differentiated adenocarcinoma arising in a tubular adenoma with tumor invading the submucosa with no lymphovascular invasion.  Cauterized resection margin is positive for tumor. -Preoperative CEA on 09/12/2018 was 46.5. - Sigmoid colon segmental resection on 09/17/2018 showing no residual carcinoma in the sigmoid colon, margins uninvolved, 3 tumor deposits present and 1/12 lymph nodes positive.  Liver biopsy was consistent with metastatic carcinoma. - He never had any history of bleeding per rectum or melena.  No family history of colorectal cancer.  Brother died of lymphoma. - MRI of the liver on 10/21/2018 shows complex but nonenhancing lesion at the prior wedge biopsy site measuring 3.1 x 2.3 cm, with a complex cystic cavity with fluid fluid level. - CT CAP shows small nodule in the right upper lobe of the lung measuring 5 mm.  Small amount of soft tissue at the anastomotic site likely postsurgical in etiology. - PET CT scan on 10/30/2018 did not show any distant metastatic disease other than in the liver.   - FOLFIRI and bevacizumab cycle 1 started on 11/03/2018, CEA level 2.3. - Developed nausea, vomiting and diarrhea after cycle 2.  We have added Emend and atropine to the premeds. -Cycle 3 on 12/01/2018, tolerated very well.  He had mild nausea but denied any vomiting. -he had one episode of blood on the tissue paper and reports that history of hemorrhoids. -We reviewed his CBC which is adequate to proceed with cycle 4.  I plan to repeat MRI of the liver and CT of the chest after cycle 6. -he will come back in 2 weeks for his next cycle and 4 weeks to see me.  2.  Peripheral  neuropathy: -He has grade 1 peripheral neuropathy in the feet from longstanding diabetes. -Has which was Irinotecan based regimen.  3.  Hypertension: - he will continue Altace 10 mg daily and HCTZ 12.5 mg daily.  He will also continue Norvasc 10 mg daily.

## 2018-12-16 LAB — CEA: CEA1: 2 ng/mL (ref 0.0–4.7)

## 2018-12-16 MED ORDER — HEPARIN SOD (PORK) LOCK FLUSH 100 UNIT/ML IV SOLN
INTRAVENOUS | Status: AC
  Filled 2018-12-16: qty 5

## 2018-12-16 MED ORDER — FULVESTRANT 250 MG/5ML IM SOLN
INTRAMUSCULAR | Status: AC
  Filled 2018-12-16: qty 5

## 2018-12-17 ENCOUNTER — Inpatient Hospital Stay (HOSPITAL_COMMUNITY): Payer: 59

## 2018-12-17 VITALS — BP 137/64 | HR 74 | Temp 97.2°F | Resp 16

## 2018-12-17 DIAGNOSIS — C187 Malignant neoplasm of sigmoid colon: Secondary | ICD-10-CM

## 2018-12-17 DIAGNOSIS — R160 Hepatomegaly, not elsewhere classified: Secondary | ICD-10-CM

## 2018-12-17 DIAGNOSIS — Z5112 Encounter for antineoplastic immunotherapy: Secondary | ICD-10-CM | POA: Diagnosis not present

## 2018-12-17 MED ORDER — HEPARIN SOD (PORK) LOCK FLUSH 100 UNIT/ML IV SOLN
500.0000 [IU] | Freq: Once | INTRAVENOUS | Status: AC | PRN
  Administered 2018-12-17: 500 [IU]

## 2018-12-17 MED ORDER — SODIUM CHLORIDE 0.9% FLUSH
10.0000 mL | INTRAVENOUS | Status: DC | PRN
  Administered 2018-12-17: 10 mL
  Filled 2018-12-17: qty 10

## 2018-12-17 MED ORDER — HEPARIN SOD (PORK) LOCK FLUSH 100 UNIT/ML IV SOLN
INTRAVENOUS | Status: AC
  Filled 2018-12-17: qty 5

## 2018-12-17 NOTE — Progress Notes (Signed)
Pt here today for Pump D/C. Port flushed and deaccessed. Pt tolerated well. Pt stable and discharged home ambulatory.

## 2018-12-19 ENCOUNTER — Other Ambulatory Visit (HOSPITAL_COMMUNITY): Payer: Self-pay | Admitting: Nurse Practitioner

## 2018-12-19 MED ORDER — HEPARIN SOD (PORK) LOCK FLUSH 100 UNIT/ML IV SOLN
INTRAVENOUS | Status: AC
  Filled 2018-12-19: qty 5

## 2018-12-30 ENCOUNTER — Other Ambulatory Visit: Payer: Self-pay

## 2018-12-30 ENCOUNTER — Inpatient Hospital Stay (HOSPITAL_COMMUNITY): Payer: 59 | Attending: Hematology

## 2018-12-30 ENCOUNTER — Inpatient Hospital Stay (HOSPITAL_COMMUNITY): Payer: 59

## 2018-12-30 ENCOUNTER — Encounter (HOSPITAL_COMMUNITY): Payer: Self-pay

## 2018-12-30 VITALS — BP 154/70 | HR 72 | Temp 97.4°F | Resp 18 | Wt 203.7 lb

## 2018-12-30 DIAGNOSIS — Z5112 Encounter for antineoplastic immunotherapy: Secondary | ICD-10-CM | POA: Insufficient documentation

## 2018-12-30 DIAGNOSIS — C787 Secondary malignant neoplasm of liver and intrahepatic bile duct: Secondary | ICD-10-CM | POA: Diagnosis not present

## 2018-12-30 DIAGNOSIS — C187 Malignant neoplasm of sigmoid colon: Secondary | ICD-10-CM

## 2018-12-30 DIAGNOSIS — R160 Hepatomegaly, not elsewhere classified: Secondary | ICD-10-CM

## 2018-12-30 DIAGNOSIS — Z5111 Encounter for antineoplastic chemotherapy: Secondary | ICD-10-CM | POA: Insufficient documentation

## 2018-12-30 DIAGNOSIS — E1142 Type 2 diabetes mellitus with diabetic polyneuropathy: Secondary | ICD-10-CM | POA: Diagnosis not present

## 2018-12-30 DIAGNOSIS — I10 Essential (primary) hypertension: Secondary | ICD-10-CM | POA: Insufficient documentation

## 2018-12-30 DIAGNOSIS — Z452 Encounter for adjustment and management of vascular access device: Secondary | ICD-10-CM | POA: Diagnosis not present

## 2018-12-30 LAB — CBC WITH DIFFERENTIAL/PLATELET
ABS IMMATURE GRANULOCYTES: 0.02 10*3/uL (ref 0.00–0.07)
Basophils Absolute: 0.1 10*3/uL (ref 0.0–0.1)
Basophils Relative: 1 %
Eosinophils Absolute: 0.2 10*3/uL (ref 0.0–0.5)
Eosinophils Relative: 2 %
HCT: 40.9 % (ref 39.0–52.0)
Hemoglobin: 13.1 g/dL (ref 13.0–17.0)
Immature Granulocytes: 0 %
Lymphocytes Relative: 33 %
Lymphs Abs: 2.6 10*3/uL (ref 0.7–4.0)
MCH: 28.3 pg (ref 26.0–34.0)
MCHC: 32 g/dL (ref 30.0–36.0)
MCV: 88.3 fL (ref 80.0–100.0)
Monocytes Absolute: 0.9 10*3/uL (ref 0.1–1.0)
Monocytes Relative: 11 %
Neutro Abs: 4.1 10*3/uL (ref 1.7–7.7)
Neutrophils Relative %: 53 %
PLATELETS: 221 10*3/uL (ref 150–400)
RBC: 4.63 MIL/uL (ref 4.22–5.81)
RDW: 16.6 % — ABNORMAL HIGH (ref 11.5–15.5)
WBC: 7.9 10*3/uL (ref 4.0–10.5)
nRBC: 0 % (ref 0.0–0.2)

## 2018-12-30 LAB — COMPREHENSIVE METABOLIC PANEL
ALBUMIN: 4.2 g/dL (ref 3.5–5.0)
ALT: 21 U/L (ref 0–44)
AST: 19 U/L (ref 15–41)
Alkaline Phosphatase: 78 U/L (ref 38–126)
Anion gap: 13 (ref 5–15)
BILIRUBIN TOTAL: 0.4 mg/dL (ref 0.3–1.2)
BUN: 25 mg/dL — ABNORMAL HIGH (ref 8–23)
CO2: 22 mmol/L (ref 22–32)
Calcium: 9.3 mg/dL (ref 8.9–10.3)
Chloride: 105 mmol/L (ref 98–111)
Creatinine, Ser: 1.06 mg/dL (ref 0.61–1.24)
GFR calc Af Amer: >60 mL/min (ref >60)
Glucose, Bld: 134 mg/dL — ABNORMAL HIGH (ref 70–99)
Potassium: 3.5 mmol/L (ref 3.5–5.1)
Sodium: 140 mmol/L (ref 135–145)
Total Protein: 7.6 g/dL (ref 6.5–8.1)

## 2018-12-30 LAB — URINALYSIS, DIPSTICK ONLY
Bilirubin Urine: NEGATIVE
Glucose, UA: >=500 mg/dL — AB
Hgb urine dipstick: NEGATIVE
Ketones, ur: NEGATIVE mg/dL
Leukocytes, UA: NEGATIVE
Nitrite: NEGATIVE
Protein, ur: 100 mg/dL — AB
SPECIFIC GRAVITY, URINE: 1.022 (ref 1.005–1.030)
pH: 5 (ref 5.0–8.0)

## 2018-12-30 MED ORDER — IRINOTECAN HCL CHEMO INJECTION 100 MG/5ML
180.0000 mg/m2 | Freq: Once | INTRAVENOUS | Status: AC
  Administered 2018-12-30: 360 mg via INTRAVENOUS
  Filled 2018-12-30: qty 5

## 2018-12-30 MED ORDER — SODIUM CHLORIDE 0.9 % IV SOLN
Freq: Once | INTRAVENOUS | Status: AC
  Administered 2018-12-30: 10:00:00 via INTRAVENOUS

## 2018-12-30 MED ORDER — LORAZEPAM 2 MG/ML IJ SOLN
1.0000 mg | Freq: Once | INTRAMUSCULAR | Status: AC
  Administered 2018-12-30: 1 mg via INTRAVENOUS
  Filled 2018-12-30: qty 1

## 2018-12-30 MED ORDER — ATROPINE SULFATE 1 MG/ML IJ SOLN
1.0000 mg | Freq: Once | INTRAMUSCULAR | Status: AC
  Administered 2018-12-30: 1 mg via INTRAVENOUS
  Filled 2018-12-30: qty 1

## 2018-12-30 MED ORDER — SODIUM CHLORIDE 0.9 % IV SOLN
2400.0000 mg/m2 | INTRAVENOUS | Status: DC
  Administered 2018-12-30: 4850 mg via INTRAVENOUS
  Filled 2018-12-30: qty 97

## 2018-12-30 MED ORDER — LEUCOVORIN CALCIUM INJECTION 350 MG
800.0000 mg | Freq: Once | INTRAVENOUS | Status: AC
  Administered 2018-12-30: 800 mg via INTRAVENOUS
  Filled 2018-12-30: qty 5

## 2018-12-30 MED ORDER — PALONOSETRON HCL INJECTION 0.25 MG/5ML
0.2500 mg | Freq: Once | INTRAVENOUS | Status: AC
  Administered 2018-12-30: 0.25 mg via INTRAVENOUS
  Filled 2018-12-30: qty 5

## 2018-12-30 MED ORDER — SODIUM CHLORIDE 0.9 % IV SOLN
Freq: Once | INTRAVENOUS | Status: AC
  Administered 2018-12-30: 11:00:00 via INTRAVENOUS
  Filled 2018-12-30: qty 5

## 2018-12-30 MED ORDER — FLUOROURACIL CHEMO INJECTION 2.5 GM/50ML
400.0000 mg/m2 | Freq: Once | INTRAVENOUS | Status: AC
  Administered 2018-12-30: 800 mg via INTRAVENOUS
  Filled 2018-12-30: qty 16

## 2018-12-30 NOTE — Progress Notes (Signed)
Pt presents today for Day 1 Cycle 5. Folfiri/ Avastin. Labs reviewed with Dr. Delton Coombes. Reported Urine Protein 100 today. Per Dr. Delton Coombes, " Hold Avastin only and proceed with chemo treatment."   Treatment given today per MD orders. Tolerated infusion without adverse affects. Vital signs stable. No complaints at this time. Discharged from clinic ambulatory. 61fu pump infusing per protocol.F/U with [facility] as scheduled.

## 2018-12-30 NOTE — Patient Instructions (Signed)
[facility] Discharge Instructions for Patients Receiving Chemotherapy  Today you received the following chemotherapy agents   To help prevent nausea and vomiting after your treatment, we encourage you to take your nausea medication   If you develop nausea and vomiting that is not controlled by your nausea medication, call the clinic.   BELOW ARE SYMPTOMS THAT SHOULD BE REPORTED IMMEDIATELY:  *FEVER GREATER THAN 100.5 F  *CHILLS WITH OR WITHOUT FEVER  NAUSEA AND VOMITING THAT IS NOT CONTROLLED WITH YOUR NAUSEA MEDICATION  *UNUSUAL SHORTNESS OF BREATH  *UNUSUAL BRUISING OR BLEEDING  TENDERNESS IN MOUTH AND THROAT WITH OR WITHOUT PRESENCE OF ULCERS  *URINARY PROBLEMS  *BOWEL PROBLEMS  UNUSUAL RASH Items with * indicate a potential emergency and should be followed up as soon as possible.  Feel free to call the clinic should you have any questions or concerns. The clinic phone number is [phone].  Please show the CHEMO ALERT CARD at check-in to the Emergency Department and triage nurse.   

## 2019-01-01 ENCOUNTER — Encounter (HOSPITAL_COMMUNITY): Payer: Self-pay

## 2019-01-01 ENCOUNTER — Inpatient Hospital Stay (HOSPITAL_COMMUNITY): Payer: 59

## 2019-01-01 VITALS — BP 150/72 | HR 70 | Temp 97.4°F | Resp 18

## 2019-01-01 DIAGNOSIS — Z5112 Encounter for antineoplastic immunotherapy: Secondary | ICD-10-CM | POA: Diagnosis not present

## 2019-01-01 DIAGNOSIS — R160 Hepatomegaly, not elsewhere classified: Secondary | ICD-10-CM

## 2019-01-01 DIAGNOSIS — C187 Malignant neoplasm of sigmoid colon: Secondary | ICD-10-CM

## 2019-01-01 MED ORDER — HEPARIN SOD (PORK) LOCK FLUSH 100 UNIT/ML IV SOLN
500.0000 [IU] | Freq: Once | INTRAVENOUS | Status: AC | PRN
  Administered 2019-01-01: 500 [IU]

## 2019-01-01 MED ORDER — SODIUM CHLORIDE 0.9% FLUSH
10.0000 mL | INTRAVENOUS | Status: DC | PRN
  Administered 2019-01-01: 10 mL
  Filled 2019-01-01: qty 10

## 2019-01-01 NOTE — Patient Instructions (Signed)
[facility] at [facility]  Discharge Instructions:   _______________________________________________________________  Thank you for choosing [facility] at [facility] to provide your oncology and hematology care.  To afford each patient quality time with our providers, please arrive at least 15 minutes before your scheduled appointment.  You need to re-schedule your appointment if you arrive 10 or more minutes late.  We strive to give you quality time with our providers, and arriving late affects you and other patients whose appointments are after yours.  Also, if you no show three or more times for appointments you may be dismissed from the clinic.  Again, thank you for choosing [facility] at [facility]. Our hope is that these requests will allow you access to exceptional care and in a timely manner. _______________________________________________________________  If you have questions after your visit, please contact our office at [phone] between the hours of 8:30 a.m. and 5:00 p.m. Voicemails left after 4:30 p.m. will not be returned until the following business day. _______________________________________________________________  For prescription refill requests, have your pharmacy contact our office. _______________________________________________________________  Recommendations made by the consultant and any test results will be sent to your referring physician. _______________________________________________________________ 

## 2019-01-01 NOTE — Progress Notes (Signed)
Patients chemotherapy pump disconnected with no complaints voiced.  Good blood return noted.  No bruising or swelling at site.  Band aid applied.  VSs with discharge and left ambulatory with no s/s of distress noted.

## 2019-01-13 ENCOUNTER — Inpatient Hospital Stay (HOSPITAL_COMMUNITY): Payer: 59

## 2019-01-13 ENCOUNTER — Encounter (HOSPITAL_COMMUNITY): Payer: Self-pay | Admitting: Hematology

## 2019-01-13 ENCOUNTER — Other Ambulatory Visit: Payer: Self-pay

## 2019-01-13 ENCOUNTER — Inpatient Hospital Stay (HOSPITAL_BASED_OUTPATIENT_CLINIC_OR_DEPARTMENT_OTHER): Payer: 59 | Admitting: Hematology

## 2019-01-13 ENCOUNTER — Encounter (HOSPITAL_COMMUNITY): Payer: Self-pay

## 2019-01-13 VITALS — BP 125/66 | HR 88 | Temp 97.8°F | Resp 18

## 2019-01-13 VITALS — BP 130/71 | HR 85 | Temp 97.7°F | Resp 18 | Wt 202.0 lb

## 2019-01-13 DIAGNOSIS — I10 Essential (primary) hypertension: Secondary | ICD-10-CM

## 2019-01-13 DIAGNOSIS — E1142 Type 2 diabetes mellitus with diabetic polyneuropathy: Secondary | ICD-10-CM

## 2019-01-13 DIAGNOSIS — C187 Malignant neoplasm of sigmoid colon: Secondary | ICD-10-CM

## 2019-01-13 DIAGNOSIS — R160 Hepatomegaly, not elsewhere classified: Secondary | ICD-10-CM

## 2019-01-13 DIAGNOSIS — C787 Secondary malignant neoplasm of liver and intrahepatic bile duct: Secondary | ICD-10-CM

## 2019-01-13 DIAGNOSIS — Z5112 Encounter for antineoplastic immunotherapy: Secondary | ICD-10-CM | POA: Diagnosis not present

## 2019-01-13 LAB — COMPREHENSIVE METABOLIC PANEL
ALT: 27 U/L (ref 0–44)
AST: 26 U/L (ref 15–41)
Albumin: 4 g/dL (ref 3.5–5.0)
Alkaline Phosphatase: 90 U/L (ref 38–126)
Anion gap: 14 (ref 5–15)
BUN: 21 mg/dL (ref 8–23)
CHLORIDE: 101 mmol/L (ref 98–111)
CO2: 21 mmol/L — ABNORMAL LOW (ref 22–32)
Calcium: 9 mg/dL (ref 8.9–10.3)
Creatinine, Ser: 1.26 mg/dL — ABNORMAL HIGH (ref 0.61–1.24)
GFR calc Af Amer: >60 mL/min (ref >60)
GFR calc non Af Amer: >60 mL/min (ref >60)
Glucose, Bld: 230 mg/dL — ABNORMAL HIGH (ref 70–99)
Potassium: 3.7 mmol/L (ref 3.5–5.1)
Sodium: 136 mmol/L (ref 135–145)
Total Bilirubin: 0.2 mg/dL — ABNORMAL LOW (ref 0.3–1.2)
Total Protein: 7 g/dL (ref 6.5–8.1)

## 2019-01-13 LAB — URINALYSIS, DIPSTICK ONLY
Bilirubin Urine: NEGATIVE
Hgb urine dipstick: NEGATIVE
Ketones, ur: NEGATIVE mg/dL
Leukocytes,Ua: NEGATIVE
Nitrite: NEGATIVE
Protein, ur: NEGATIVE mg/dL
Specific Gravity, Urine: 1.025 (ref 1.005–1.030)
pH: 5 (ref 5.0–8.0)

## 2019-01-13 LAB — CBC WITH DIFFERENTIAL/PLATELET
Abs Immature Granulocytes: 0.03 10*3/uL (ref 0.00–0.07)
BASOS ABS: 0 10*3/uL (ref 0.0–0.1)
Basophils Relative: 1 %
Eosinophils Absolute: 0.2 10*3/uL (ref 0.0–0.5)
Eosinophils Relative: 3 %
HCT: 38.8 % — ABNORMAL LOW (ref 39.0–52.0)
Hemoglobin: 12.4 g/dL — ABNORMAL LOW (ref 13.0–17.0)
Immature Granulocytes: 0 %
Lymphocytes Relative: 33 %
Lymphs Abs: 2.6 10*3/uL (ref 0.7–4.0)
MCH: 28.8 pg (ref 26.0–34.0)
MCHC: 32 g/dL (ref 30.0–36.0)
MCV: 90.2 fL (ref 80.0–100.0)
Monocytes Absolute: 0.7 10*3/uL (ref 0.1–1.0)
Monocytes Relative: 9 %
Neutro Abs: 4.4 10*3/uL (ref 1.7–7.7)
Neutrophils Relative %: 54 %
PLATELETS: 205 10*3/uL (ref 150–400)
RBC: 4.3 MIL/uL (ref 4.22–5.81)
RDW: 17.5 % — ABNORMAL HIGH (ref 11.5–15.5)
WBC: 8.1 10*3/uL (ref 4.0–10.5)
nRBC: 0 % (ref 0.0–0.2)

## 2019-01-13 MED ORDER — ATROPINE SULFATE 1 MG/ML IJ SOLN
1.0000 mg | Freq: Once | INTRAMUSCULAR | Status: AC
  Administered 2019-01-13: 1 mg via INTRAVENOUS
  Filled 2019-01-13: qty 1

## 2019-01-13 MED ORDER — SODIUM CHLORIDE 0.9 % IV SOLN
5.0000 mg/kg | Freq: Once | INTRAVENOUS | Status: AC
  Administered 2019-01-13: 450 mg via INTRAVENOUS
  Filled 2019-01-13: qty 16

## 2019-01-13 MED ORDER — LORAZEPAM 2 MG/ML IJ SOLN
1.0000 mg | Freq: Once | INTRAMUSCULAR | Status: AC
  Administered 2019-01-13: 1 mg via INTRAVENOUS
  Filled 2019-01-13: qty 1

## 2019-01-13 MED ORDER — SODIUM CHLORIDE 0.9 % IV SOLN
Freq: Once | INTRAVENOUS | Status: AC
  Administered 2019-01-13: 10:00:00 via INTRAVENOUS

## 2019-01-13 MED ORDER — FLUOROURACIL CHEMO INJECTION 2.5 GM/50ML
400.0000 mg/m2 | Freq: Once | INTRAVENOUS | Status: AC
  Administered 2019-01-13: 800 mg via INTRAVENOUS
  Filled 2019-01-13: qty 16

## 2019-01-13 MED ORDER — AMLODIPINE BESYLATE 10 MG PO TABS: 10.0000 mg | ORAL_TABLET | Freq: Every day | ORAL | 1 refills | Status: DC

## 2019-01-13 MED ORDER — IRINOTECAN HCL CHEMO INJECTION 100 MG/5ML
180.0000 mg/m2 | Freq: Once | INTRAVENOUS | Status: AC
  Administered 2019-01-13: 360 mg via INTRAVENOUS
  Filled 2019-01-13: qty 15

## 2019-01-13 MED ORDER — PALONOSETRON HCL INJECTION 0.25 MG/5ML
0.2500 mg | Freq: Once | INTRAVENOUS | Status: AC
  Administered 2019-01-13: 0.25 mg via INTRAVENOUS
  Filled 2019-01-13: qty 5

## 2019-01-13 MED ORDER — SODIUM CHLORIDE 0.9 % IV SOLN
2400.0000 mg/m2 | INTRAVENOUS | Status: DC
  Administered 2019-01-13: 4850 mg via INTRAVENOUS
  Filled 2019-01-13: qty 97

## 2019-01-13 MED ORDER — LEUCOVORIN CALCIUM INJECTION 350 MG
800.0000 mg | Freq: Once | INTRAVENOUS | Status: AC
  Administered 2019-01-13: 800 mg via INTRAVENOUS
  Filled 2019-01-13: qty 35

## 2019-01-13 MED ORDER — SODIUM CHLORIDE 0.9% FLUSH
10.0000 mL | INTRAVENOUS | Status: DC | PRN
  Administered 2019-01-13: 10 mL
  Filled 2019-01-13: qty 10

## 2019-01-13 MED ORDER — SODIUM CHLORIDE 0.9 % IV SOLN
Freq: Once | INTRAVENOUS | Status: AC
  Administered 2019-01-13: 11:00:00 via INTRAVENOUS
  Filled 2019-01-13: qty 5

## 2019-01-13 NOTE — Progress Notes (Signed)
Patient seen by the oncologist with lab review and ok to treat today verbal order Dr. Delton Coombes.   Patient tolerated chemotherapy with no complaints voiced.  Port site clean and dry with no bruising or swelling noted at site.  Good blood return noted before and after administration of chemotherapy.  Dressing intact.  Chemotherapy pump connected with no alarms noted.   Patient left ambulatory with VSS and no s/s of distress noted.

## 2019-01-13 NOTE — Progress Notes (Signed)
[place], [place] [address]   CLINIC:  Medical Oncology/Hematology  PCP:  Sharilyn Sites, MD [name] [place] [address] [phone]   REASON FOR VISIT: Follow-up for metastatic sigmoid colon cancer to the liver  CURRENT THERAPY:Folfiri and bevacizumab (AVASTIN)   BRIEF ONCOLOGIC HISTORY:    Malignant neoplasm of sigmoid colon ([place])   09/17/2018 Initial Diagnosis    Malignant neoplasm of sigmoid colon ([place])    11/03/2018 -  Chemotherapy    The patient had palonosetron (ALOXI) injection 0.25 mg, 0.25 mg, Intravenous,  Once, 6 of 6 cycles Administration: 0.25 mg (11/03/2018), 0.25 mg (11/18/2018), 0.25 mg (12/01/2018), 0.25 mg (12/15/2018), 0.25 mg (12/30/2018) bevacizumab (AVASTIN) 450 mg in sodium chloride 0.9 % 100 mL chemo infusion, 5 mg/kg = 450 mg, Intravenous,  Once, 6 of 6 cycles Administration: 450 mg (11/03/2018), 450 mg (11/18/2018), 450 mg (12/01/2018), 450 mg (12/15/2018) irinotecan (CAMPTOSAR) 360 mg in sodium chloride 0.9 % 500 mL chemo infusion, 180 mg/m2 = 360 mg, Intravenous,  Once, 6 of 6 cycles Administration: 360 mg (11/03/2018), 360 mg (11/18/2018), 360 mg (12/01/2018), 360 mg (12/15/2018), 360 mg (12/30/2018) leucovorin 800 mg in sodium chloride 0.9 % 250 mL infusion, 808 mg, Intravenous,  Once, 6 of 6 cycles Administration: 800 mg (11/03/2018), 800 mg (11/18/2018), 800 mg (12/01/2018), 800 mg (12/15/2018), 800 mg (12/30/2018) fosaprepitant (EMEND) 150 mg in sodium chloride 0.9 % 145 mL IVPB, 150 mg, Intravenous,  Once, 1 of 1 cycle Administration: 150 mg (12/01/2018) fluorouracil (ADRUCIL) chemo injection 800 mg, 400 mg/m2 = 800 mg, Intravenous,  Once, 6 of 6 cycles Administration: 800 mg (11/03/2018), 800 mg (11/18/2018), 800 mg (12/01/2018), 800 mg (12/15/2018), 800 mg (12/30/2018) fosaprepitant (EMEND) 150 mg, dexamethasone (DECADRON) 10 mg in sodium chloride 0.9 % 145 mL IVPB, , Intravenous,  Once, 3 of 3  cycles Administration:  (12/15/2018),  (12/30/2018) fluorouracil (ADRUCIL) 4,850 mg in sodium chloride 0.9 % 53 mL chemo infusion, 2,400 mg/m2 = 4,850 mg, Intravenous, 1 Day/Dose, 6 of 6 cycles Administration: 4,850 mg (11/03/2018), 4,850 mg (11/18/2018), 4,850 mg (12/01/2018), 4,850 mg (12/15/2018), 4,850 mg (12/30/2018)  for chemotherapy treatment.      INTERVAL HISTORY:  Ian [name] 63 y.o. male returns for routine follow-up for metastatic sigmoid colon cancer to the liver. He is here today with his wife. He is doing well since his last treatment. He reports nausea for a few days after treatment. He is also experiencing fatigue for the week after treatment. Denies any nausea, vomiting, or diarrhea. Denies any new pains. Had not noticed any recent bleeding such as epistaxis, hematuria or hematochezia. Denies recent chest pain on exertion, shortness of breath on minimal exertion, pre-syncopal episodes, or palpitations. Denies any numbness or tingling in hands or feet. Denies any recent fevers, infections, or recent hospitalizations. Patient reports appetite at 100% and energy level at 50%. He is eating well and maintaining his weight at this time.     REVIEW OF SYSTEMS:  Review of Systems  Constitutional: Positive for fatigue.  Gastrointestinal: Positive for nausea.  All other systems reviewed and are negative.    PAST MEDICAL/SURGICAL HISTORY:  Past Medical History:  Diagnosis Date  . 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 Colon cancer ([facility])   . Complication of anesthesia    atypical pseudo-cholinesterase deficiency  . Diabetes mellitus without complication ([place])   . Family history of adverse reaction to anesthesia    sister also has atypical pseudo-cholinesterase deficiency  . GERD (  gastroesophageal reflux disease)   . History of heart bypass surgery   . Hypertension    Past Surgical History:  Procedure Laterality Date  . COLONOSCOPY N/A 08/29/2018   Procedure: COLONOSCOPY;   Surgeon: Daneil Dolin, MD;  Location: [facility];  Service: Endoscopy;  Laterality: N/A;  9:30  . CORONARY ARTERY BYPASS GRAFT     4 vessels  . heart bypass    . INSERTION OF MESH  02/07/2015   Procedure: INSERTION OF MESH;  Surgeon: Aviva Signs Md, MD;  Location: [facility] ORS;  Service: General;;  . PARTIAL COLECTOMY N/A 09/17/2018   Procedure: PARTIAL COLECTOMY WITH PARTIAL WEDGE RESECTION LIVER METASTASIS;  Surgeon: Aviva Signs, MD;  Location: [facility] ORS;  Service: General;  Laterality: N/A;  . POLYPECTOMY  08/29/2018   Procedure: POLYPECTOMY;  Surgeon: Daneil Dolin, MD;  Location: [facility];  Service: Endoscopy;;  . PORTACATH PLACEMENT Left 10/10/2018   Procedure: INSERTION PORT-A-CATH (catheter in left subclavian);  Surgeon: Aviva Signs, MD;  Location: [facility] ORS;  Service: General;  Laterality: Left;  . UMBILICAL HERNIA REPAIR N/A 02/07/2015   Procedure: UMBILICAL HERNIORRHAPHY WITH MESH;  Surgeon: Aviva Signs Md, MD;  Location: [facility] OR[facility];  Service: General;  Laterality: N/A;     SOCIAL HISTORY:  Social History   Socioeconomic History  . Marital status: Married    Spouse name: Not on file  . Number of children: 3  . Years of education: Not on file  . Highest education level: Not on file  Occupational History  . 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 Financial resource strain: Not hard at all  . Food insecurity:    Worry: Never true    Inability: Never true  . Transportation needs:    Medical: No    Non-medical: No  Tobacco Use  . Smoking status: Never Smoker  . Smokeless tobacco: Never Used  Substance and Sexual Activity  . Alcohol use: No  . Drug use: No  . Sexual activity: Yes    Birth control/protection: None  Lifestyle  . Physical activity:    Days per week: 0 days    Minutes per session: 0 min  . Stress: Not at all  Relationships  . Social connections:    Talks on phone: More than three times a week    Gets  together: More than three times a week    Attends religious service: Never    Active member of club or organization: No    Attends meetings of clubs or organizations: Never    Relationship status: Married  . Intimate partner violence:    Fear of current or ex partner: No    Emotionally abused: No    Physically abused: No    Forced sexual activity: No  Other Topics Concern  . Not on file  Social History Narrative  . Not on file    FAMILY HISTORY:  Family History  Problem Relation Age of Onset  . Hypertension Father   . Diabetes Father   . Heart disease Father   . Diabetes Sister   . Hypertension Sister   . Hypertension Brother   . Diabetes Brother   . Diabetes Sister   . Diabetes Brother   . Heart disease Brother   . Lymphoma Brother   . Hypertension Son     CURRENT MEDICATIONS:  Outpatient Encounter Medications as of 01/13/2019  Medication Sig Note  . aspirin EC 81 MG  tablet Take 81 mg by mouth daily.   . B-D UF III MINI PEN NEEDLES 31G X 5 MM MISC    . Bevacizumab (AVASTIN IV) Inject 450 mg into the vein every 14 (fourteen) days.   . Coenzyme Q10 (COQ10) 100 MG CAPS Take 100 mg by mouth daily.   Marland Kitchen diltiazem (CARDIZEM CD) 240 MG 24 hr capsule Take 240 mg by mouth daily.   . diphenhydramine-acetaminophen (TYLENOL PM) 25-500 MG TABS tablet Take 2 tablets by mouth at bedtime.   Marland Kitchen ezetimibe-simvastatin (VYTORIN) 10-80 MG per tablet Take 1 tablet by mouth at bedtime.   Marland Kitchen FARXIGA 10 MG TABS tablet    . GAVILYTE-N WITH FLAVOR PACK 420 g solution    . HUMALOG MIX 75/25 KWIKPEN (75-25) 100 UNIT/ML Kwikpen    . hydrochlorothiazide (MICROZIDE) 12.5 MG capsule Take 12.5 mg by mouth daily.   . Insulin Glargine (BASAGLAR KWIKPEN) 100 UNIT/ML SOPN    . insulin glargine (LANTUS SOLOSTAR) 100 UNIT/ML injection Inject 20 Units into the skin at bedtime.  10/10/2018: Took 10 units  . insulin lispro protamine-lispro (HUMALOG 75/25 MIX) (75-25) 100 UNIT/ML SUSP injection Inject 16 Units  into the skin 3 (three) times daily. 10/10/2018: 1/2 dose  . IRINOTECAN HCL IV Inject 360 mg into the vein every 14 (fourteen) days.   Marland Kitchen JANUVIA 100 MG tablet    . LEUCOVORIN CALCIUM IV Inject 808 mg into the vein every 14 (fourteen) days.   Marland Kitchen lidocaine-prilocaine (EMLA) cream Apply small amount over port site one hour prior to appointment. Cover with plastic wrap.   . loperamide (IMODIUM A-D) 2 MG tablet Take 2 capsules after first loose stool and 1 capsule after each loose stool thereafter.   . metFORMIN (GLUCOPHAGE) 1000 MG tablet Take 1,000 mg by mouth 2 (two) times daily.    . metoprolol succinate (TOPROL-XL) 50 MG 24 hr tablet Take 75 mg by mouth 2 (two) times daily.   . niacin (NIASPAN) 500 MG CR tablet Take 500 mg by mouth at bedtime.   Marland Kitchen NOVOLOG MIX 70/30 FLEXPEN (70-30) 100 UNIT/ML FlexPen    . Omega-3 Fatty Acids (FISH OIL PO) Take 1 capsule by mouth daily.   . pantoprazole (PROTONIX) 40 MG tablet Take 40 mg by mouth daily.   . prochlorperazine (COMPAZINE) 10 MG tablet Take 1 tablet (10 mg total) by mouth every 6 (six) hours as needed (NAUSEA).   . ramipril (ALTACE) 10 MG capsule Take 10 mg by mouth daily.   . sodium chloride 0.9 % SOLN with fluorouracil 5 GM/100ML SOLN 200 mg/m2/day Inject 5,650 mg into the vein. Over 46 hours   . [DISCONTINUED] amLODipine (NORVASC) 10 MG tablet Take 1 tablet (10 mg total) by mouth daily.   . [DISCONTINUED] amLODipine (NORVASC) 5 MG tablet TAKE ONE TABLET BY MOUTH ONCE DAILY.    No facility-administered encounter medications on file as of 01/13/2019.     ALLERGIES:  Allergies  Allergen Reactions  . Anectine [Succinylcholine] Other (See Comments)    Atypical pseudocholinesterase deficiency.      PHYSICAL EXAM:  ECOG Performance status: 1  Vitals:   01/13/19 0920  BP: 130/71  Pulse: 85  Resp: 18  Temp: 97.7 F (36.5 C)  SpO2: 99%   Filed Weights   01/13/19 0920  Weight: 202 lb (91.6 kg)    Physical Exam Constitutional:       Appearance: Normal appearance. He is normal weight.  Cardiovascular:     Rate and Rhythm: Normal rate and  regular rhythm.     Heart sounds: Normal heart sounds.  Pulmonary:     Effort: Pulmonary effort is normal.     Breath sounds: Normal breath sounds.  Musculoskeletal: Normal range of motion.  Skin:    General: Skin is warm and dry.  Neurological:     Mental Status: He is alert and oriented to person, place, and time. Mental status is at baseline.  Psychiatric:        Mood and Affect: Mood normal.        Behavior: Behavior normal.        Thought Content: Thought content normal.        Judgment: Judgment normal.      LABORATORY DATA:  I have reviewed the labs as listed.  CBC    Component Value Date/Time   WBC 8.1 01/13/2019 0903   RBC 4.30 01/13/2019 0903   HGB 12.4 (L) 01/13/2019 0903   HCT 38.8 (L) 01/13/2019 0903   PLT 205 01/13/2019 0903   MCV 90.2 01/13/2019 0903   MCH 28.8 01/13/2019 0903   MCHC 32.0 01/13/2019 0903   RDW 17.5 (H) 01/13/2019 0903   LYMPHSABS 2.6 01/13/2019 0903   MONOABS 0.7 01/13/2019 0903   EOSABS 0.2 01/13/2019 0903   BASOSABS 0.0 01/13/2019 0903   CMP Latest Ref Rng & Units 01/13/2019 12/30/2018 12/15/2018  Glucose 70 - 99 mg/dL 230(H) 134(H) 185(H)  BUN 8 - 23 mg/dL 21 25(H) 15  Creatinine 0.61 - 1.24 mg/dL 1.26(H) 1.06 0.94  Sodium 135 - 145 mmol/L 136 140 140  Potassium 3.5 - 5.1 mmol/L 3.7 3.5 4.2  Chloride 98 - 111 mmol/L 101 105 106  CO2 22 - 32 mmol/L 21(L) 22 23  Calcium 8.9 - 10.3 mg/dL 9.0 9.3 9.5  Total Protein 6.5 - 8.1 g/dL 7.0 7.6 7.3  Total Bilirubin 0.3 - 1.2 mg/dL 0.2(L) 0.4 0.5  Alkaline Phos 38 - 126 U/L 90 78 83  AST 15 - 41 U/L '26 19 25  ' ALT 0 - 44 U/L '27 21 29       ' DIAGNOSTIC IMAGING:  I have independently reviewed the scans and discussed with the patient.   I have reviewed Francene Finders, NP's note and agree with the documentation.  I personally performed a face-to-face visit, made revisions and my  assessment and plan is as follows.    ASSESSMENT & PLAN:   Malignant neoplasm of sigmoid colon ([place]) 1.  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  Pathology was consistent with invasive moderately differentiated adenocarcinoma arising in a tubular adenoma with tumor invading the submucosa with no lymphovascular invasion.  Cauterized resection margin is positive for tumor. -Preoperative CEA on 09/12/2018 was 46.5. - Sigmoid colon segmental resection on 09/17/2018 showing no residual carcinoma in the sigmoid colon, margins uninvolved, 3 tumor deposits present and 1/12 lymph nodes positive.  Liver biopsy was consistent with metastatic carcinoma. - He never had any history of bleeding per rectum or melena.  No family history of colorectal cancer.  Brother died of lymphoma. - MRI of the liver on 10/21/2018 shows complex but nonenhancing lesion at the prior wedge biopsy site measuring 3.1 x 2.3 cm, with a complex cystic cavity with fluid fluid level. - CT CAP shows small nodule in the right upper lobe  of the lung measuring 5 mm.  Small amount of soft tissue at the anastomotic site likely postsurgical in etiology. - PET CT scan on 10/30/2018 did not show any distant metastatic disease other than in the liver.   - 5 cycles of FOLFIRI and bevacizumab from 11/03/2018 through 12/30/2018. -Last CEA on 12/15/2018 was 2.  CEA at start of cycle 1 was 2.3. - He denied any problems with bleeding.  He denies any major abdominal cramping or diarrhea. - He may proceed with cycle 5 of FOLFIRI and bevacizumab.  We checked his urine protein which was negative. -We will also order a CT scan of his chest and MRI of the abdomen to follow-up on response to chemotherapy prior to next visit in 2 weeks.  2.   Peripheral neuropathy: -He has grade 1 peripheral neuropathy in the feet from longstanding diabetes. -Has which was Irinotecan based regimen.  3.  Hypertension: - His blood pressure today is 130/70.  He will continue Altace 10 mg daily and HCTZ 12.5 mg daily.  He will also continue Norvasc 10 mg daily.         Orders placed this encounter:  Orders Placed This Encounter  Procedures  . MR LIVER W WO CONTRAST  . CT CHEST W CONTRAST      Ian [name], [place] [phone]

## 2019-01-13 NOTE — Assessment & Plan Note (Signed)
1.  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  Pathology was consistent with invasive moderately differentiated adenocarcinoma arising in a tubular adenoma with tumor invading the submucosa with no lymphovascular invasion.  Cauterized resection margin is positive for tumor. -Preoperative CEA on 09/12/2018 was 46.5. - Sigmoid colon segmental resection on 09/17/2018 showing no residual carcinoma in the sigmoid colon, margins uninvolved, 3 tumor deposits present and 1/12 lymph nodes positive.  Liver biopsy was consistent with metastatic carcinoma. - He never had any history of bleeding per rectum or melena.  No family history of colorectal cancer.  Brother died of lymphoma. - MRI of the liver on 10/21/2018 shows complex but nonenhancing lesion at the prior wedge biopsy site measuring 3.1 x 2.3 cm, with a complex cystic cavity with fluid fluid level. - CT CAP shows small nodule in the right upper lobe of the lung measuring 5 mm.  Small amount of soft tissue at the anastomotic site likely postsurgical in etiology. - PET CT scan on 10/30/2018 did not show any distant metastatic disease other than in the liver.   - 5 cycles of FOLFIRI and bevacizumab from 11/03/2018 through 12/30/2018. -Last CEA on 12/15/2018 was 2.  CEA at start of cycle 1 was 2.3. - He denied any problems with bleeding.  He denies any major abdominal cramping or diarrhea. - He may proceed with cycle 5 of FOLFIRI and bevacizumab.  We checked his urine protein which was negative. -We will also order a CT scan of his chest and MRI of the abdomen to follow-up on response to chemotherapy prior to next visit in 2 weeks.  2.  Peripheral neuropathy: -He has grade 1 peripheral neuropathy in the feet from longstanding diabetes. -Has  which was Irinotecan based regimen.  3.  Hypertension: - His blood pressure today is 130/70.  He will continue Altace 10 mg daily and HCTZ 12.5 mg daily.  He will also continue Norvasc 10 mg daily.

## 2019-01-13 NOTE — Patient Instructions (Signed)
[facility] Discharge Instructions for Patients Receiving Chemotherapy  Today you received the following chemotherapy agents  If you develop nausea and vomiting that is not controlled by your nausea medication, call the clinic.   BELOW ARE SYMPTOMS THAT SHOULD BE REPORTED IMMEDIATELY:  *FEVER GREATER THAN 100.5 F  *CHILLS WITH OR WITHOUT FEVER  NAUSEA AND VOMITING THAT IS NOT CONTROLLED WITH YOUR NAUSEA MEDICATION  *UNUSUAL SHORTNESS OF BREATH  *UNUSUAL BRUISING OR BLEEDING  TENDERNESS IN MOUTH AND THROAT WITH OR WITHOUT PRESENCE OF ULCERS  *URINARY PROBLEMS  *BOWEL PROBLEMS  UNUSUAL RASH Items with * indicate a potential emergency and should be followed up as soon as possible.  Feel free to call the clinic should you have any questions or concerns. The clinic phone number is [phone].  Please show the CHEMO ALERT CARD at check-in to the Emergency Department and triage nurse.   

## 2019-01-13 NOTE — Patient Instructions (Signed)
[place] at [facility] Discharge Instructions  Follow up in 2 weeks with labs and scans   Thank you for choosing [place] at [facility] to provide your oncology and hematology care.  To afford each patient quality time with our provider, please arrive at least 15 minutes before your scheduled appointment time.   If you have a lab appointment with the [facility] please come in thru the  Main Entrance and check in at the main information desk  You need to re-schedule your appointment should you arrive 10 or more minutes late.  We strive to give you quality time with our providers, and arriving late affects you and other patients whose appointments are after yours.  Also, if you no show three or more times for appointments you may be dismissed from the clinic at the providers discretion.     Again, thank you for choosing [facility].  Our hope is that these requests will decrease the amount of time that you wait before being seen by our physicians.       _____________________________________________________________  Should you have questions after your visit to [facility], please contact our office at [phone] between the hours of 8:00 a.m. and 4:30 p.m.  Voicemails left after 4:00 p.m. will not be returned until the following business day.  For prescription refill requests, have your pharmacy contact our office and allow 72 hours.    Cancer Center Support Programs:   > Cancer Support Group  2nd Tuesday of the month 1pm-2pm, Journey Room

## 2019-01-15 ENCOUNTER — Encounter (HOSPITAL_COMMUNITY): Payer: Self-pay

## 2019-01-15 ENCOUNTER — Inpatient Hospital Stay (HOSPITAL_COMMUNITY): Payer: 59

## 2019-01-15 NOTE — Patient Instructions (Signed)
[facility] Discharge Instructions for Patients Receiving Chemotherapy  Today you received the following chemotherapy agents   To help prevent nausea and vomiting after your treatment, we encourage you to take your nausea medication   If you develop nausea and vomiting that is not controlled by your nausea medication, call the clinic.   BELOW ARE SYMPTOMS THAT SHOULD BE REPORTED IMMEDIATELY:  *FEVER GREATER THAN 100.5 F  *CHILLS WITH OR WITHOUT FEVER  NAUSEA AND VOMITING THAT IS NOT CONTROLLED WITH YOUR NAUSEA MEDICATION  *UNUSUAL SHORTNESS OF BREATH  *UNUSUAL BRUISING OR BLEEDING  TENDERNESS IN MOUTH AND THROAT WITH OR WITHOUT PRESENCE OF ULCERS  *URINARY PROBLEMS  *BOWEL PROBLEMS  UNUSUAL RASH Items with * indicate a potential emergency and should be followed up as soon as possible.  Feel free to call the clinic should you have any questions or concerns. The clinic phone number is [phone].  Please show the CHEMO ALERT CARD at check-in to the Emergency Department and triage nurse.   

## 2019-01-15 NOTE — Progress Notes (Signed)
Rowe Robert presented for Portacath access and flush. Portacath located in the left chest wall accessed with  H 20 needle. Clean, Dry and Intact Good blood return present. Portacath flushed with 29ml NS and 500U/50ml Heparin per protocol and needle removed intact. Procedure without incident. Patient tolerated procedure well.

## 2019-01-20 ENCOUNTER — Ambulatory Visit (HOSPITAL_COMMUNITY)
Admission: RE | Admit: 2019-01-20 | Discharge: 2019-01-20 | Disposition: A | Discharge status: Home / Self Care | Payer: 59 | Source: Ambulatory Visit | Attending: Nurse Practitioner | Admitting: Nurse Practitioner

## 2019-01-20 DIAGNOSIS — C187 Malignant neoplasm of sigmoid colon: Secondary | ICD-10-CM | POA: Insufficient documentation

## 2019-01-20 MED ORDER — GADOBUTROL 1 MMOL/ML IV SOLN
10.0000 mL | Freq: Once | INTRAVENOUS | Status: AC | PRN
  Administered 2019-01-20: 10 mL via INTRAVENOUS

## 2019-01-23 ENCOUNTER — Ambulatory Visit (HOSPITAL_COMMUNITY)
Admission: RE | Admit: 2019-01-23 | Discharge: 2019-01-23 | Disposition: A | Discharge status: Home / Self Care | Payer: 59 | Source: Ambulatory Visit | Attending: Nurse Practitioner | Admitting: Nurse Practitioner

## 2019-01-23 ENCOUNTER — Other Ambulatory Visit: Payer: Self-pay

## 2019-01-23 ENCOUNTER — Inpatient Hospital Stay (HOSPITAL_COMMUNITY): Payer: 59 | Attending: Hematology

## 2019-01-23 DIAGNOSIS — E1142 Type 2 diabetes mellitus with diabetic polyneuropathy: Secondary | ICD-10-CM | POA: Diagnosis not present

## 2019-01-23 DIAGNOSIS — R160 Hepatomegaly, not elsewhere classified: Secondary | ICD-10-CM

## 2019-01-23 DIAGNOSIS — I10 Essential (primary) hypertension: Secondary | ICD-10-CM | POA: Diagnosis not present

## 2019-01-23 DIAGNOSIS — C187 Malignant neoplasm of sigmoid colon: Secondary | ICD-10-CM | POA: Insufficient documentation

## 2019-01-23 DIAGNOSIS — Z452 Encounter for adjustment and management of vascular access device: Secondary | ICD-10-CM | POA: Insufficient documentation

## 2019-01-23 DIAGNOSIS — Z5111 Encounter for antineoplastic chemotherapy: Secondary | ICD-10-CM | POA: Diagnosis present

## 2019-01-23 DIAGNOSIS — C787 Secondary malignant neoplasm of liver and intrahepatic bile duct: Secondary | ICD-10-CM | POA: Insufficient documentation

## 2019-01-23 DIAGNOSIS — Z5112 Encounter for antineoplastic immunotherapy: Secondary | ICD-10-CM | POA: Diagnosis present

## 2019-01-23 LAB — CBC WITH DIFFERENTIAL/PLATELET
Abs Immature Granulocytes: 0.07 10*3/uL (ref 0.00–0.07)
Basophils Absolute: 0.1 10*3/uL (ref 0.0–0.1)
Basophils Relative: 1 %
EOS PCT: 2 %
Eosinophils Absolute: 0.1 10*3/uL (ref 0.0–0.5)
HCT: 38.1 % — ABNORMAL LOW (ref 39.0–52.0)
Hemoglobin: 12.5 g/dL — ABNORMAL LOW (ref 13.0–17.0)
Immature Granulocytes: 1 %
Lymphocytes Relative: 41 %
Lymphs Abs: 3.1 10*3/uL (ref 0.7–4.0)
MCH: 29.5 pg (ref 26.0–34.0)
MCHC: 32.8 g/dL (ref 30.0–36.0)
MCV: 89.9 fL (ref 80.0–100.0)
Monocytes Absolute: 0.9 10*3/uL (ref 0.1–1.0)
Monocytes Relative: 11 %
Neutro Abs: 3.4 10*3/uL (ref 1.7–7.7)
Neutrophils Relative %: 44 %
PLATELETS: 261 10*3/uL (ref 150–400)
RBC: 4.24 MIL/uL (ref 4.22–5.81)
RDW: 18.6 % — ABNORMAL HIGH (ref 11.5–15.5)
WBC: 7.5 10*3/uL (ref 4.0–10.5)
nRBC: 0.8 % — ABNORMAL HIGH (ref 0.0–0.2)

## 2019-01-23 LAB — COMPREHENSIVE METABOLIC PANEL
ALT: 31 U/L (ref 0–44)
ANION GAP: 11 (ref 5–15)
AST: 29 U/L (ref 15–41)
Albumin: 3.9 g/dL (ref 3.5–5.0)
Alkaline Phosphatase: 75 U/L (ref 38–126)
BUN: 10 mg/dL (ref 8–23)
CO2: 24 mmol/L (ref 22–32)
Calcium: 9.4 mg/dL (ref 8.9–10.3)
Chloride: 107 mmol/L (ref 98–111)
Creatinine, Ser: 0.85 mg/dL (ref 0.61–1.24)
GFR calc Af Amer: >60 mL/min (ref >60)
GFR calc non Af Amer: >60 mL/min (ref >60)
Glucose, Bld: 113 mg/dL — ABNORMAL HIGH (ref 70–99)
Potassium: 3.5 mmol/L (ref 3.5–5.1)
Sodium: 142 mmol/L (ref 135–145)
Total Bilirubin: 0.5 mg/dL (ref 0.3–1.2)
Total Protein: 7.1 g/dL (ref 6.5–8.1)

## 2019-01-23 MED ORDER — IOHEXOL 300 MG/ML  SOLN
100.0000 mL | Freq: Once | INTRAMUSCULAR | Status: AC | PRN
  Administered 2019-01-23: 100 mL via INTRAVENOUS

## 2019-01-27 ENCOUNTER — Other Ambulatory Visit: Payer: Self-pay

## 2019-01-27 ENCOUNTER — Inpatient Hospital Stay (HOSPITAL_COMMUNITY): Payer: 59

## 2019-01-27 ENCOUNTER — Encounter (HOSPITAL_COMMUNITY): Payer: Self-pay | Admitting: Hematology

## 2019-01-27 ENCOUNTER — Inpatient Hospital Stay (HOSPITAL_BASED_OUTPATIENT_CLINIC_OR_DEPARTMENT_OTHER): Payer: 59 | Admitting: Hematology

## 2019-01-27 VITALS — BP 117/73 | HR 74 | Temp 97.4°F | Resp 18

## 2019-01-27 DIAGNOSIS — Z5112 Encounter for antineoplastic immunotherapy: Secondary | ICD-10-CM | POA: Diagnosis not present

## 2019-01-27 DIAGNOSIS — C187 Malignant neoplasm of sigmoid colon: Secondary | ICD-10-CM

## 2019-01-27 DIAGNOSIS — I10 Essential (primary) hypertension: Secondary | ICD-10-CM | POA: Diagnosis not present

## 2019-01-27 DIAGNOSIS — E1142 Type 2 diabetes mellitus with diabetic polyneuropathy: Secondary | ICD-10-CM

## 2019-01-27 DIAGNOSIS — C787 Secondary malignant neoplasm of liver and intrahepatic bile duct: Secondary | ICD-10-CM

## 2019-01-27 DIAGNOSIS — R160 Hepatomegaly, not elsewhere classified: Secondary | ICD-10-CM

## 2019-01-27 MED ORDER — SODIUM CHLORIDE 0.9 % IV SOLN
Freq: Once | INTRAVENOUS | Status: AC
  Administered 2019-01-27: 11:00:00 via INTRAVENOUS

## 2019-01-27 MED ORDER — SODIUM CHLORIDE 0.9% FLUSH
10.0000 mL | INTRAVENOUS | Status: DC | PRN
  Administered 2019-01-27: 10 mL
  Filled 2019-01-27: qty 10

## 2019-01-27 MED ORDER — FLUOROURACIL CHEMO INJECTION 2.5 GM/50ML
400.0000 mg/m2 | Freq: Once | INTRAVENOUS | Status: AC
  Administered 2019-01-27: 800 mg via INTRAVENOUS
  Filled 2019-01-27: qty 16

## 2019-01-27 MED ORDER — PALONOSETRON HCL INJECTION 0.25 MG/5ML
0.2500 mg | Freq: Once | INTRAVENOUS | Status: AC
  Administered 2019-01-27: 0.25 mg via INTRAVENOUS
  Filled 2019-01-27: qty 5

## 2019-01-27 MED ORDER — SODIUM CHLORIDE 0.9 % IV SOLN
5.0000 mg/kg | Freq: Once | INTRAVENOUS | Status: AC
  Administered 2019-01-27: 450 mg via INTRAVENOUS
  Filled 2019-01-27: qty 16

## 2019-01-27 MED ORDER — SODIUM CHLORIDE 0.9 % IV SOLN
Freq: Once | INTRAVENOUS | Status: AC
  Administered 2019-01-27: 11:00:00 via INTRAVENOUS
  Filled 2019-01-27: qty 5

## 2019-01-27 MED ORDER — LEUCOVORIN CALCIUM INJECTION 350 MG
800.0000 mg | Freq: Once | INTRAVENOUS | Status: AC
  Administered 2019-01-27: 800 mg via INTRAVENOUS
  Filled 2019-01-27: qty 35

## 2019-01-27 MED ORDER — SODIUM CHLORIDE 0.9 % IV SOLN
2400.0000 mg/m2 | INTRAVENOUS | Status: DC
  Administered 2019-01-27: 4850 mg via INTRAVENOUS
  Filled 2019-01-27: qty 97

## 2019-01-27 MED ORDER — IRINOTECAN HCL CHEMO INJECTION 100 MG/5ML
180.0000 mg/m2 | Freq: Once | INTRAVENOUS | Status: AC
  Administered 2019-01-27: 360 mg via INTRAVENOUS
  Filled 2019-01-27: qty 15

## 2019-01-27 MED ORDER — LORAZEPAM 2 MG/ML IJ SOLN
1.0000 mg | Freq: Once | INTRAMUSCULAR | Status: AC
  Administered 2019-01-27: 1 mg via INTRAVENOUS
  Filled 2019-01-27: qty 1

## 2019-01-27 MED ORDER — ATROPINE SULFATE 1 MG/ML IJ SOLN
1.0000 mg | Freq: Once | INTRAMUSCULAR | Status: AC
  Administered 2019-01-27: 1 mg via INTRAVENOUS
  Filled 2019-01-27: qty 1

## 2019-01-27 NOTE — Patient Instructions (Signed)
[facility] at [facility] Discharge Instructions     Thank you for choosing [facility] at [facility] to provide your oncology and hematology care.  To afford each patient quality time with our provider, please arrive at least 15 minutes before your scheduled appointment time.   If you have a lab appointment with the Cancer Center please come in thru the  Main Entrance and check in at the main information desk  You need to re-schedule your appointment should you arrive 10 or more minutes late.  We strive to give you quality time with our providers, and arriving late affects you and other patients whose appointments are after yours.  Also, if you no show three or more times for appointments you may be dismissed from the clinic at the providers discretion.     Again, thank you for choosing [facility].  Our hope is that these requests will decrease the amount of time that you wait before being seen by our physicians.       _____________________________________________________________  Should you have questions after your visit to  Cancer Center, please contact our office at [phone] between the hours of 8:00 a.m. and 4:30 p.m.  Voicemails left after 4:00 p.m. will not be returned until the following business day.  For prescription refill requests, have your pharmacy contact our office and allow 72 hours.    Cancer Center Support Programs:   > Cancer Support Group  2nd Tuesday of the month 1pm-2pm, Journey Room    

## 2019-01-27 NOTE — Progress Notes (Signed)
Labs reviewed with Dr. Delton Coombes today during office visit. No new issues reported by Patient. Will proceed with treatment today per MD.    Treatment given per orders. Patient tolerated it well without problems. Vitals stable and discharged home from clinic ambulatory. Follow up as scheduled.

## 2019-01-27 NOTE — Patient Instructions (Signed)
[facility] Discharge Instructions for Patients Receiving Chemotherapy  Today you received the following chemotherapy agents   To help prevent nausea and vomiting after your treatment, we encourage you to take your nausea medication   If you develop nausea and vomiting that is not controlled by your nausea medication, call the clinic.   BELOW ARE SYMPTOMS THAT SHOULD BE REPORTED IMMEDIATELY:  *FEVER GREATER THAN 100.5 F  *CHILLS WITH OR WITHOUT FEVER  NAUSEA AND VOMITING THAT IS NOT CONTROLLED WITH YOUR NAUSEA MEDICATION  *UNUSUAL SHORTNESS OF BREATH  *UNUSUAL BRUISING OR BLEEDING  TENDERNESS IN MOUTH AND THROAT WITH OR WITHOUT PRESENCE OF ULCERS  *URINARY PROBLEMS  *BOWEL PROBLEMS  UNUSUAL RASH Items with * indicate a potential emergency and should be followed up as soon as possible.  Feel free to call the clinic should you have any questions or concerns. The clinic phone number is [phone].  Please show the CHEMO ALERT CARD at check-in to the Emergency Department and triage nurse.   

## 2019-01-27 NOTE — Progress Notes (Signed)
[employer] [place], [place] [address]   CLINIC:  Medical Oncology/Hematology  PCP:  Sharilyn Sites, MD [place] [address] [phone]   REASON FOR VISIT: Follow-up for metastatic sigmoid colon cancer to the liver  CURRENT THERAPY:Folfiri and bevacizumab (AVASTIN)   BRIEF ONCOLOGIC HISTORY:    Malignant neoplasm of sigmoid colon ([place])   09/17/2018 Initial Diagnosis    Malignant neoplasm of sigmoid colon ([place])    11/03/2018 -  Chemotherapy    The patient had palonosetron (ALOXI) injection 0.25 mg, 0.25 mg, Intravenous,  Once, 7 of 12 cycles Administration: 0.25 mg (11/03/2018), 0.25 mg (11/18/2018), 0.25 mg (12/01/2018), 0.25 mg (12/15/2018), 0.25 mg (12/30/2018), 0.25 mg (01/13/2019), 0.25 mg (01/27/2019) bevacizumab (AVASTIN) 450 mg in sodium chloride 0.9 % 100 mL chemo infusion, 5 mg/kg = 450 mg, Intravenous,  Once, 7 of 12 cycles Administration: 450 mg (11/03/2018), 450 mg (11/18/2018), 450 mg (12/01/2018), 450 mg (12/15/2018), 450 mg (01/13/2019), 450 mg (01/27/2019) irinotecan (CAMPTOSAR) 360 mg in sodium chloride 0.9 % 500 mL chemo infusion, 180 mg/m2 = 360 mg, Intravenous,  Once, 7 of 12 cycles Administration: 360 mg (11/03/2018), 360 mg (11/18/2018), 360 mg (12/01/2018), 360 mg (12/15/2018), 360 mg (12/30/2018), 360 mg (01/13/2019), 360 mg (01/27/2019) leucovorin 800 mg in sodium chloride 0.9 % 250 mL infusion, 808 mg, Intravenous,  Once, 7 of 12 cycles Administration: 800 mg (11/03/2018), 800 mg (11/18/2018), 800 mg (12/01/2018), 800 mg (12/15/2018), 800 mg (12/30/2018), 800 mg (01/13/2019), 800 mg (01/27/2019) fosaprepitant (EMEND) 150 mg in sodium chloride 0.9 % 145 mL IVPB, 150 mg, Intravenous,  Once, 1 of 1 cycle Administration: 150 mg (12/01/2018) fluorouracil (ADRUCIL) chemo injection 800 mg, 400 mg/m2 = 800 mg, Intravenous,  Once, 7 of 12 cycles Administration: 800 mg (11/03/2018), 800 mg (11/18/2018), 800 mg (12/01/2018), 800 mg  (12/15/2018), 800 mg (12/30/2018), 800 mg (01/13/2019), 800 mg (01/27/2019) fosaprepitant (EMEND) 150 mg, dexamethasone (DECADRON) 10 mg in sodium chloride 0.9 % 145 mL IVPB, , Intravenous,  Once, 4 of 9 cycles Administration:  (12/15/2018),  (12/30/2018),  (01/13/2019),  (01/27/2019) fluorouracil (ADRUCIL) 4,850 mg in sodium chloride 0.9 % 53 mL chemo infusion, 2,400 mg/m2 = 4,850 mg, Intravenous, 1 Day/Dose, 7 of 12 cycles Administration: 4,850 mg (11/03/2018), 4,850 mg (11/18/2018), 4,850 mg (12/01/2018), 4,850 mg (12/15/2018), 4,850 mg (12/30/2018), 4,850 mg (01/13/2019), 4,850 mg (01/27/2019)  for chemotherapy treatment.      INTERVAL HISTORY:  Ian [name] 63 y.o. male returns for routine follow-up for metastatic sigmoid colon cancer to the liver. He is here today with his wife. He reports he is feeling better since his last visit. He has diarrhea and weakness for 3 days after every treatment. He is fatigued all the time everyday. Denies any nausea or vomiting. Denies any new pains. Had not noticed any recent bleeding such as epistaxis, hematuria or hematochezia. Denies recent chest pain on exertion, shortness of breath on minimal exertion, pre-syncopal episodes, or palpitations. Denies any numbness or tingling in hands or feet. Denies any recent fevers, infections, or recent hospitalizations. Patient reports appetite at 100% and energy level at 25%. He is eating well and maintaining his weight.    REVIEW OF SYSTEMS:  Review of Systems  Constitutional: Positive for fatigue.  Gastrointestinal: Positive for diarrhea.  Neurological: Positive for extremity weakness.  All other systems reviewed and are negative.    PAST MEDICAL/SURGICAL HISTORY:  Past Medical History:  Diagnosis Date  . 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 Colon cancer (  [place])   . Complication of anesthesia    atypical pseudo-cholinesterase deficiency  . Diabetes mellitus without complication ([place])   . Family history of adverse  reaction to anesthesia    sister also has atypical pseudo-cholinesterase deficiency  . GERD (gastroesophageal reflux disease)   . History of heart bypass surgery   . Hypertension    Past Surgical History:  Procedure Laterality Date  . COLONOSCOPY N/A 08/29/2018   Procedure: COLONOSCOPY;  Surgeon: Daneil Dolin, MD;  Location: [facility];  Service: Endoscopy;  Laterality: N/A;  9:30  . CORONARY ARTERY BYPASS GRAFT     4 vessels  . heart bypass    . INSERTION OF MESH  02/07/2015   Procedure: INSERTION OF MESH;  Surgeon: Aviva Signs Md, MD;  Location: [facility] ORS;  Service: General;;  . PARTIAL COLECTOMY N/A 09/17/2018   Procedure: PARTIAL COLECTOMY WITH PARTIAL WEDGE RESECTION LIVER METASTASIS;  Surgeon: Aviva Signs, MD;  Location: [facility] ORS;  Service: General;  Laterality: N/A;  . POLYPECTOMY  08/29/2018   Procedure: POLYPECTOMY;  Surgeon: Daneil Dolin, MD;  Location: [facility];  Service: Endoscopy;;  . PORTACATH PLACEMENT Left 10/10/2018   Procedure: INSERTION PORT-A-CATH (catheter in left subclavian);  Surgeon: Aviva Signs, MD;  Location: [facility] ORS;  Service: General;  Laterality: Left;  . UMBILICAL HERNIA REPAIR N/A 02/07/2015   Procedure: UMBILICAL HERNIORRHAPHY WITH MESH;  Surgeon: Aviva Signs Md, MD;  Location: [facility] ORS;  Service: General;  Laterality: N/A;     SOCIAL HISTORY:  Social History   Socioeconomic History  . Marital status: Married    Spouse name: Not on file  . Number of children: 3  . Years of education: Not on file  . Highest education level: Not on file  Occupational History  . 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 Financial resource strain: Not hard at all  . Food insecurity:    Worry: Never true    Inability: Never true  . Transportation needs:    Medical: No    Non-medical: No  Tobacco Use  . Smoking status: Never Smoker  . Smokeless tobacco: Never Used  Substance and Sexual Activity  . Alcohol use:  No  . Drug use: No  . Sexual activity: Yes    Birth control/protection: None  Lifestyle  . Physical activity:    Days per week: 0 days    Minutes per session: 0 min  . Stress: Not at all  Relationships  . Social connections:    Talks on phone: More than three times a week    Gets together: More than three times a week    Attends religious service: Never    Active member of club or organization: No    Attends meetings of clubs or organizations: Never    Relationship status: Married  . Intimate partner violence:    Fear of current or ex partner: No    Emotionally abused: No    Physically abused: No    Forced sexual activity: No  Other Topics Concern  . Not on file  Social History Narrative  . Not on file    FAMILY HISTORY:  Family History  Problem Relation Age of Onset  . Hypertension Father   . Diabetes Father   . Heart disease Father   . Diabetes Sister   . Hypertension Sister   . Hypertension Brother   . Diabetes Brother   . Diabetes Sister   .  Diabetes Brother   . Heart disease Brother   . Lymphoma Brother   . Hypertension Son     CURRENT MEDICATIONS:  Outpatient Encounter Medications as of 01/27/2019  Medication Sig Note  . amLODipine (NORVASC) 10 MG tablet Take 1 tablet (10 mg total) by mouth daily.   Marland Kitchen aspirin EC 81 MG tablet Take 81 mg by mouth daily.   . B-D UF III MINI PEN NEEDLES 31G X 5 MM MISC    . Bevacizumab (AVASTIN IV) Inject 450 mg into the vein every 14 (fourteen) days.   . Coenzyme Q10 (COQ10) 100 MG CAPS Take 100 mg by mouth daily.   Marland Kitchen diltiazem (CARDIZEM CD) 240 MG 24 hr capsule Take 240 mg by mouth daily.   . diphenhydramine-acetaminophen (TYLENOL PM) 25-500 MG TABS tablet Take 2 tablets by mouth at bedtime.   Marland Kitchen ezetimibe-simvastatin (VYTORIN) 10-80 MG per tablet Take 1 tablet by mouth at bedtime.   Marland Kitchen FARXIGA 10 MG TABS tablet    . GAVILYTE-N WITH FLAVOR PACK 420 g solution    . HUMALOG MIX 75/25 KWIKPEN (75-25) 100 UNIT/ML Kwikpen    .  hydrochlorothiazide (MICROZIDE) 12.5 MG capsule Take 12.5 mg by mouth daily.   . Insulin Glargine (BASAGLAR KWIKPEN) 100 UNIT/ML SOPN    . insulin glargine (LANTUS SOLOSTAR) 100 UNIT/ML injection Inject 20 Units into the skin at bedtime.  10/10/2018: Took 10 units  . insulin lispro protamine-lispro (HUMALOG 75/25 MIX) (75-25) 100 UNIT/ML SUSP injection Inject 16 Units into the skin 3 (three) times daily. 10/10/2018: 1/2 dose  . IRINOTECAN HCL IV Inject 360 mg into the vein every 14 (fourteen) days.   Marland Kitchen JANUVIA 100 MG tablet    . LEUCOVORIN CALCIUM IV Inject 808 mg into the vein every 14 (fourteen) days.   Marland Kitchen lidocaine-prilocaine (EMLA) cream Apply small amount over port site one hour prior to appointment. Cover with plastic wrap.   . loperamide (IMODIUM A-D) 2 MG tablet Take 2 capsules after first loose stool and 1 capsule after each loose stool thereafter.   . metFORMIN (GLUCOPHAGE) 1000 MG tablet Take 1,000 mg by mouth 2 (two) times daily.    . metoprolol succinate (TOPROL-XL) 50 MG 24 hr tablet Take 75 mg by mouth 2 (two) times daily.   . niacin (NIASPAN) 500 MG CR tablet Take 500 mg by mouth at bedtime.   Marland Kitchen NOVOLOG MIX 70/30 FLEXPEN (70-30) 100 UNIT/ML FlexPen    . Omega-3 Fatty Acids (FISH OIL PO) Take 1 capsule by mouth daily.   . pantoprazole (PROTONIX) 40 MG tablet Take 40 mg by mouth daily.   . prochlorperazine (COMPAZINE) 10 MG tablet Take 1 tablet (10 mg total) by mouth every 6 (six) hours as needed (NAUSEA).   . ramipril (ALTACE) 10 MG capsule Take 10 mg by mouth daily.   . sodium chloride 0.9 % SOLN with fluorouracil 5 GM/100ML SOLN 200 mg/m2/day Inject 5,650 mg into the vein. Over 46 hours    No facility-administered encounter medications on file as of 01/27/2019.     ALLERGIES:  Allergies  Allergen Reactions  . Anectine [Succinylcholine] Other (See Comments)    Atypical pseudocholinesterase deficiency.      PHYSICAL EXAM:  ECOG Performance status: 1  Vitals:   01/27/19  0947  BP: (!) 146/67  Pulse: 88  Resp: 16  SpO2: 99%   Filed Weights   01/27/19 0947  Weight: 200 lb 9.6 oz (91 kg)    Physical Exam Constitutional:  Appearance: Normal appearance. He is normal weight.  Cardiovascular:     Rate and Rhythm: Normal rate and regular rhythm.     Heart sounds: Normal heart sounds.  Pulmonary:     Effort: Pulmonary effort is normal.     Breath sounds: Normal breath sounds.  Musculoskeletal: Normal range of motion.  Skin:    General: Skin is warm and dry.  Neurological:     Mental Status: He is alert and oriented to person, place, and time. Mental status is at baseline.  Psychiatric:        Mood and Affect: Mood normal.        Behavior: Behavior normal.        Thought Content: Thought content normal.        Judgment: Judgment normal.      LABORATORY DATA:  I have reviewed the labs as listed.  CBC    Component Value Date/Time   WBC 7.5 01/23/2019 1116   RBC 4.24 01/23/2019 1116   HGB 12.5 (L) 01/23/2019 1116   HCT 38.1 (L) 01/23/2019 1116   PLT 261 01/23/2019 1116   MCV 89.9 01/23/2019 1116   MCH 29.5 01/23/2019 1116   MCHC 32.8 01/23/2019 1116   RDW 18.6 (H) 01/23/2019 1116   LYMPHSABS 3.1 01/23/2019 1116   MONOABS 0.9 01/23/2019 1116   EOSABS 0.1 01/23/2019 1116   BASOSABS 0.1 01/23/2019 1116   CMP Latest Ref Rng & Units 01/23/2019 01/13/2019 12/30/2018  Glucose 70 - 99 mg/dL 113(H) 230(H) 134(H)  BUN 8 - 23 mg/dL 10 21 25(H)  Creatinine 0.61 - 1.24 mg/dL 0.85 1.26(H) 1.06  Sodium 135 - 145 mmol/L 142 136 140  Potassium 3.5 - 5.1 mmol/L 3.5 3.7 3.5  Chloride 98 - 111 mmol/L 107 101 105  CO2 22 - 32 mmol/L 24 21(L) 22  Calcium 8.9 - 10.3 mg/dL 9.4 9.0 9.3  Total Protein 6.5 - 8.1 g/dL 7.1 7.0 7.6  Total Bilirubin 0.3 - 1.2 mg/dL 0.5 0.2(L) 0.4  Alkaline Phos 38 - 126 U/L 75 90 78  AST 15 - 41 U/L '29 26 19  ' ALT 0 - 44 U/L '31 27 21       ' DIAGNOSTIC IMAGING:  I have independently reviewed the scans and discussed with the  patient.   I have reviewed Francene Finders, NP's note and agree with the documentation.  I personally performed a face-to-face visit, made revisions and my assessment and plan is as follows.    ASSESSMENT & PLAN:   Malignant neoplasm of sigmoid colon ([place]) 1.  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  Pathology was consistent with invasive moderately differentiated adenocarcinoma arising in a tubular adenoma with tumor invading the submucosa with no lymphovascular invasion.  Cauterized resection margin is positive for tumor. -Preoperative CEA on 09/12/2018 was 46.5. - Sigmoid colon segmental resection on 09/17/2018 showing no residual carcinoma in the sigmoid colon, margins uninvolved, 3 tumor deposits present and 1/12 lymph nodes positive.  Liver biopsy was consistent with metastatic carcinoma. - He never had any history of bleeding per rectum or melena.  No family history of colorectal cancer.  Brother died of lymphoma. - MRI of the liver on 10/21/2018 shows complex but nonenhancing lesion at the prior wedge biopsy site measuring 3.1 x 2.3 cm, with  a complex cystic cavity with fluid fluid level. - CT CAP shows small nodule in the right upper lobe of the lung measuring 5 mm.  Small amount of soft tissue at the anastomotic site likely postsurgical in etiology. - PET CT scan on 10/30/2018 did not show any distant metastatic disease other than in the liver.   - 6 cycles of FOLFIRI and bevacizumab from 11/03/2018 through 01/13/2019. -Last CEA on 12/15/2018 was 2. - CT of the chest on 01/23/2019 showed 2-4 mm pulmonary nodule in the right lung, stable from prior study.  No other abnormalities were seen related to malignancy. -MRI of the liver on 01/20/2019 shows decrease in size of the  subcapsular liver metastasis, measuring 2 x 1.9 cm, previously 2.9 x 2.8 cm.  No new areas were seen. - I have recommended continuing 3 more months of chemotherapy followed by scans.  If it continues to shrink, we will consider surgical resection of the liver lesion at that time.  I do not believe lung lesion is of any significance. -She is tolerating chemotherapy except some fatigue.  Denies any bleeding issues.  He will proceed with cycle 7 today.  I will see him back in 4 weeks for follow-up.  2.  Peripheral neuropathy: -He has grade 1 peripheral neuropathy in the feet from longstanding diabetes. -Has which was Irinotecan based regimen.  3.  Hypertension: - Blood pressure is 146/67.  He will continue Altace and HCTZ.  He will also continue Norvasc.       Orders placed this encounter:  No orders of the defined types were placed in this encounter.     Derek Jack, MD [place] [phone]

## 2019-01-27 NOTE — Assessment & Plan Note (Signed)
1.  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  Pathology was consistent with invasive moderately differentiated adenocarcinoma arising in a tubular adenoma with tumor invading the submucosa with no lymphovascular invasion.  Cauterized resection margin is positive for tumor. -Preoperative CEA on 09/12/2018 was 46.5. - Sigmoid colon segmental resection on 09/17/2018 showing no residual carcinoma in the sigmoid colon, margins uninvolved, 3 tumor deposits present and 1/12 lymph nodes positive.  Liver biopsy was consistent with metastatic carcinoma. - He never had any history of bleeding per rectum or melena.  No family history of colorectal cancer.  Brother died of lymphoma. - MRI of the liver on 10/21/2018 shows complex but nonenhancing lesion at the prior wedge biopsy site measuring 3.1 x 2.3 cm, with a complex cystic cavity with fluid fluid level. - CT CAP shows small nodule in the right upper lobe of the lung measuring 5 mm.  Small amount of soft tissue at the anastomotic site likely postsurgical in etiology. - PET CT scan on 10/30/2018 did not show any distant metastatic disease other than in the liver.   - 6 cycles of FOLFIRI and bevacizumab from 11/03/2018 through 01/13/2019. -Last CEA on 12/15/2018 was 2. - CT of the chest on 01/23/2019 showed 2-4 mm pulmonary nodule in the right lung, stable from prior study.  No other abnormalities were seen related to malignancy. -MRI of the liver on 01/20/2019 shows decrease in size of the subcapsular liver metastasis, measuring 2 x 1.9 cm, previously 2.9 x 2.8 cm.  No new areas were seen. - I have recommended continuing 3 more months of chemotherapy followed by scans.  If it continues to shrink, we will consider surgical resection of the liver lesion at  that time.  I do not believe lung lesion is of any significance. -She is tolerating chemotherapy except some fatigue.  Denies any bleeding issues.  He will proceed with cycle 7 today.  I will see him back in 4 weeks for follow-up.  2.  Peripheral neuropathy: -He has grade 1 peripheral neuropathy in the feet from longstanding diabetes. -Has which was Irinotecan based regimen.  3.  Hypertension: - Blood pressure is 146/67.  He will continue Altace and HCTZ.  He will also continue Norvasc.

## 2019-01-29 ENCOUNTER — Other Ambulatory Visit: Payer: Self-pay

## 2019-01-29 ENCOUNTER — Inpatient Hospital Stay (HOSPITAL_COMMUNITY): Payer: 59

## 2019-01-29 VITALS — BP 144/68 | HR 78 | Temp 97.9°F | Resp 18

## 2019-01-29 DIAGNOSIS — C187 Malignant neoplasm of sigmoid colon: Secondary | ICD-10-CM

## 2019-01-29 DIAGNOSIS — Z5112 Encounter for antineoplastic immunotherapy: Secondary | ICD-10-CM | POA: Diagnosis not present

## 2019-01-29 DIAGNOSIS — R160 Hepatomegaly, not elsewhere classified: Secondary | ICD-10-CM

## 2019-01-29 MED ORDER — HEPARIN SOD (PORK) LOCK FLUSH 100 UNIT/ML IV SOLN
500.0000 [IU] | Freq: Once | INTRAVENOUS | Status: AC | PRN
  Administered 2019-01-29: 500 [IU]

## 2019-01-29 MED ORDER — SODIUM CHLORIDE 0.9% FLUSH
10.0000 mL | INTRAVENOUS | Status: DC | PRN
  Administered 2019-01-29: 10 mL
  Filled 2019-01-29: qty 10

## 2019-02-10 ENCOUNTER — Encounter (HOSPITAL_COMMUNITY): Payer: Self-pay

## 2019-02-10 ENCOUNTER — Other Ambulatory Visit: Payer: Self-pay

## 2019-02-10 ENCOUNTER — Inpatient Hospital Stay (HOSPITAL_COMMUNITY): Payer: 59

## 2019-02-10 VITALS — BP 150/71 | HR 77 | Temp 97.8°F | Resp 18

## 2019-02-10 DIAGNOSIS — Z5112 Encounter for antineoplastic immunotherapy: Secondary | ICD-10-CM | POA: Diagnosis not present

## 2019-02-10 DIAGNOSIS — R160 Hepatomegaly, not elsewhere classified: Secondary | ICD-10-CM

## 2019-02-10 DIAGNOSIS — C187 Malignant neoplasm of sigmoid colon: Secondary | ICD-10-CM

## 2019-02-10 LAB — CBC WITH DIFFERENTIAL/PLATELET
Abs Immature Granulocytes: 0.04 10*3/uL (ref 0.00–0.07)
Basophils Absolute: 0.1 10*3/uL (ref 0.0–0.1)
Basophils Relative: 1 %
EOS ABS: 0 10*3/uL (ref 0.0–0.5)
Eosinophils Relative: 0 %
HCT: 40.1 % (ref 39.0–52.0)
Hemoglobin: 13.1 g/dL (ref 13.0–17.0)
Immature Granulocytes: 0 %
Lymphocytes Relative: 22 %
Lymphs Abs: 2.2 10*3/uL (ref 0.7–4.0)
MCH: 29.8 pg (ref 26.0–34.0)
MCHC: 32.7 g/dL (ref 30.0–36.0)
MCV: 91.3 fL (ref 80.0–100.0)
Monocytes Absolute: 0.8 10*3/uL (ref 0.1–1.0)
Monocytes Relative: 8 %
Neutro Abs: 6.9 10*3/uL (ref 1.7–7.7)
Neutrophils Relative %: 69 %
Platelets: 234 10*3/uL (ref 150–400)
RBC: 4.39 MIL/uL (ref 4.22–5.81)
RDW: 18.6 % — AB (ref 11.5–15.5)
WBC: 10.1 10*3/uL (ref 4.0–10.5)
nRBC: 0 % (ref 0.0–0.2)

## 2019-02-10 LAB — URINALYSIS, DIPSTICK ONLY
BILIRUBIN URINE: NEGATIVE
Glucose, UA: >=500 mg/dL — AB
Hgb urine dipstick: NEGATIVE
Ketones, ur: NEGATIVE mg/dL
Leukocytes,Ua: NEGATIVE
Nitrite: NEGATIVE
Protein, ur: 30 mg/dL — AB
SPECIFIC GRAVITY, URINE: 1.024 (ref 1.005–1.030)
pH: 5 (ref 5.0–8.0)

## 2019-02-10 LAB — COMPREHENSIVE METABOLIC PANEL
ALT: 31 U/L (ref 0–44)
AST: 23 U/L (ref 15–41)
Albumin: 4.2 g/dL (ref 3.5–5.0)
Alkaline Phosphatase: 83 U/L (ref 38–126)
Anion gap: 13 (ref 5–15)
BUN: 17 mg/dL (ref 8–23)
CO2: 21 mmol/L — ABNORMAL LOW (ref 22–32)
Calcium: 9.6 mg/dL (ref 8.9–10.3)
Chloride: 105 mmol/L (ref 98–111)
Creatinine, Ser: 1.03 mg/dL (ref 0.61–1.24)
GFR calc Af Amer: >60 mL/min (ref >60)
Glucose, Bld: 224 mg/dL — ABNORMAL HIGH (ref 70–99)
Potassium: 3.7 mmol/L (ref 3.5–5.1)
Sodium: 139 mmol/L (ref 135–145)
Total Bilirubin: 0.5 mg/dL (ref 0.3–1.2)
Total Protein: 7.4 g/dL (ref 6.5–8.1)

## 2019-02-10 MED ORDER — FLUOROURACIL CHEMO INJECTION 2.5 GM/50ML
400.0000 mg/m2 | Freq: Once | INTRAVENOUS | Status: AC
  Administered 2019-02-10: 800 mg via INTRAVENOUS
  Filled 2019-02-10: qty 16

## 2019-02-10 MED ORDER — SODIUM CHLORIDE 0.9 % IV SOLN
Freq: Once | INTRAVENOUS | Status: AC
  Administered 2019-02-10: 10:00:00 via INTRAVENOUS

## 2019-02-10 MED ORDER — SODIUM CHLORIDE 0.9 % IV SOLN
Freq: Once | INTRAVENOUS | Status: AC
  Administered 2019-02-10: 11:00:00 via INTRAVENOUS
  Filled 2019-02-10: qty 5

## 2019-02-10 MED ORDER — PALONOSETRON HCL INJECTION 0.25 MG/5ML
0.2500 mg | Freq: Once | INTRAVENOUS | Status: AC
  Administered 2019-02-10: 0.25 mg via INTRAVENOUS
  Filled 2019-02-10: qty 5

## 2019-02-10 MED ORDER — SODIUM CHLORIDE 0.9% FLUSH
10.0000 mL | INTRAVENOUS | Status: DC | PRN
  Administered 2019-02-10: 10 mL
  Filled 2019-02-10: qty 10

## 2019-02-10 MED ORDER — ATROPINE SULFATE 1 MG/ML IJ SOLN
1.0000 mg | Freq: Once | INTRAMUSCULAR | Status: AC
  Administered 2019-02-10: 1 mg via INTRAVENOUS
  Filled 2019-02-10: qty 1

## 2019-02-10 MED ORDER — LORAZEPAM 2 MG/ML IJ SOLN
1.0000 mg | Freq: Once | INTRAMUSCULAR | Status: AC
  Administered 2019-02-10: 1 mg via INTRAVENOUS
  Filled 2019-02-10: qty 1

## 2019-02-10 MED ORDER — SODIUM CHLORIDE 0.9 % IV SOLN
Freq: Once | INTRAVENOUS | Status: AC
  Administered 2019-02-10: 12:00:00 via INTRAVENOUS

## 2019-02-10 MED ORDER — SODIUM CHLORIDE 0.9 % IV SOLN
2400.0000 mg/m2 | INTRAVENOUS | Status: DC
  Administered 2019-02-10: 4850 mg via INTRAVENOUS
  Filled 2019-02-10: qty 97

## 2019-02-10 MED ORDER — LEUCOVORIN CALCIUM INJECTION 350 MG
800.0000 mg | Freq: Once | INTRAVENOUS | Status: AC
  Administered 2019-02-10: 800 mg via INTRAVENOUS
  Filled 2019-02-10: qty 40

## 2019-02-10 MED ORDER — IRINOTECAN HCL CHEMO INJECTION 100 MG/5ML
180.0000 mg/m2 | Freq: Once | INTRAVENOUS | Status: AC
  Administered 2019-02-10: 360 mg via INTRAVENOUS
  Filled 2019-02-10: qty 15

## 2019-02-10 MED ORDER — SODIUM CHLORIDE 0.9 % IV SOLN
5.0000 mg/kg | Freq: Once | INTRAVENOUS | Status: AC
  Administered 2019-02-10: 450 mg via INTRAVENOUS
  Filled 2019-02-10: qty 16

## 2019-02-10 NOTE — Patient Instructions (Signed)
[facility] Discharge Instructions for Patients Receiving Chemotherapy   Beginning January 23rd 2017 lab work for the Cancer Center will be done in the  Main lab at [facility] on 1st floor. If you have a lab appointment with the Cancer Center please come in thru the  Main Entrance and check in at the main information desk   Today you received the following chemotherapy agents Avastin,Irinotecan,Leucovorin and 5FU. Follow-up as scheduled. Call clinic for any questions or concerns  To help prevent nausea and vomiting after your treatment, we encourage you to take your nausea medication   If you develop nausea and vomiting, or diarrhea that is not controlled by your medication, call the clinic.  The clinic phone number is [phone]. Office hours are Monday-Friday 8:30am-5:00pm.  BELOW ARE SYMPTOMS THAT SHOULD BE REPORTED IMMEDIATELY:  *FEVER GREATER THAN 101.0 F  *CHILLS WITH OR WITHOUT FEVER  NAUSEA AND VOMITING THAT IS NOT CONTROLLED WITH YOUR NAUSEA MEDICATION  *UNUSUAL SHORTNESS OF BREATH  *UNUSUAL BRUISING OR BLEEDING  TENDERNESS IN MOUTH AND THROAT WITH OR WITHOUT PRESENCE OF ULCERS  *URINARY PROBLEMS  *BOWEL PROBLEMS  UNUSUAL RASH Items with * indicate a potential emergency and should be followed up as soon as possible. If you have an emergency after office hours please contact your primary care physician or go to the nearest emergency department.  Please call the clinic during office hours if you have any questions or concerns.   You may also contact the Patient Navigator at [phone] should you have any questions or need assistance in obtaining follow up care.      Resources For Cancer Patients and their Caregivers ? American Cancer Society: Can assist with transportation, wigs, general needs, runs Look Good Feel Better.        1-888-227-6333 ? Cancer Care: Provides financial assistance, online support groups, medication/co-pay  assistance.  1-800-813-HOPE (4673) ? [facility] Assists [place] cancer patients and their families through emotional , educational and financial support.  336-427-4357 ? [place] DSS Where to apply for food stamps, Medicaid and utility assistance. 336-342-1394 ? [facility]: Transportation to medical appointments. 336-347-2287 ? Social Security Administration: May apply for disability if have a Stage IV cancer. 336-342-7796 1-800-772-1213 ? [place] Aging, Disability and Transit Services: Assists with nutrition, care and transit needs. 336-349-2343         

## 2019-02-10 NOTE — Progress Notes (Signed)
Rowe Robert tolerated chemo tx well without complaints or incident. Lab and urine results reviewed prior to administering chemotherapy.VSS upon discharge. Pt discharged with 5FU pump infusing without issues. Pt discharged self ambulatory in satisfactory condition

## 2019-02-12 ENCOUNTER — Encounter (HOSPITAL_COMMUNITY): Payer: Self-pay

## 2019-02-12 ENCOUNTER — Other Ambulatory Visit: Payer: Self-pay

## 2019-02-12 ENCOUNTER — Inpatient Hospital Stay (HOSPITAL_COMMUNITY): Payer: 59

## 2019-02-12 VITALS — BP 141/70 | HR 84 | Temp 97.8°F | Resp 18

## 2019-02-12 DIAGNOSIS — Z5112 Encounter for antineoplastic immunotherapy: Secondary | ICD-10-CM | POA: Diagnosis not present

## 2019-02-12 DIAGNOSIS — C187 Malignant neoplasm of sigmoid colon: Secondary | ICD-10-CM

## 2019-02-12 DIAGNOSIS — R160 Hepatomegaly, not elsewhere classified: Secondary | ICD-10-CM

## 2019-02-12 MED ORDER — SODIUM CHLORIDE 0.9% FLUSH
10.0000 mL | INTRAVENOUS | Status: DC | PRN
  Administered 2019-02-12: 10 mL
  Filled 2019-02-12: qty 10

## 2019-02-12 MED ORDER — HEPARIN SOD (PORK) LOCK FLUSH 100 UNIT/ML IV SOLN
500.0000 [IU] | Freq: Once | INTRAVENOUS | Status: AC | PRN
  Administered 2019-02-12: 500 [IU]

## 2019-02-12 NOTE — Progress Notes (Signed)
Ian [name] tolerated 5FU pump well without complaints or incident. 5FU pump discontinued with port flushed easily per protocol then de-accessed. VSS Pt discharged self ambulatory in satisfactory condition

## 2019-02-12 NOTE — Patient Instructions (Signed)
[place] at [facility] Discharge Instructions  5FU  Pump discontinued with portacath flushed. Follow-up as scheduled. Call clinic for any questions or concerns   Thank you for choosing [place] at [facility] to provide your oncology and hematology care.  To afford each patient quality time with our provider, please arrive at least 15 minutes before your scheduled appointment time.   If you have a lab appointment with the [facility] please come in thru the  Main Entrance and check in at the main information desk  You need to re-schedule your appointment should you arrive 10 or more minutes late.  We strive to give you quality time with our providers, and arriving late affects you and other patients whose appointments are after yours.  Also, if you no show three or more times for appointments you may be dismissed from the clinic at the providers discretion.     Again, thank you for choosing [facility].  Our hope is that these requests will decrease the amount of time that you wait before being seen by our physicians.       _____________________________________________________________  Should you have questions after your visit to [facility], please contact our office at [phone] between the hours of 8:00 a.m. and 4:30 p.m.  Voicemails left after 4:00 p.m. will not be returned until the following business day.  For prescription refill requests, have your pharmacy contact our office and allow 72 hours.    Cancer Center Support Programs:   > Cancer Support Group  2nd Tuesday of the month 1pm-2pm, Journey Room

## 2019-02-13 MED ORDER — PEGFILGRASTIM-CBQV 6 MG/0.6ML ~~LOC~~ SOSY
PREFILLED_SYRINGE | SUBCUTANEOUS | Status: AC
  Filled 2019-02-13: qty 0.6

## 2019-02-23 ENCOUNTER — Other Ambulatory Visit: Payer: Self-pay

## 2019-02-24 ENCOUNTER — Inpatient Hospital Stay (HOSPITAL_COMMUNITY): Payer: 59

## 2019-02-24 ENCOUNTER — Other Ambulatory Visit (HOSPITAL_COMMUNITY): Payer: Self-pay

## 2019-02-24 ENCOUNTER — Other Ambulatory Visit: Payer: Self-pay

## 2019-02-24 ENCOUNTER — Encounter (HOSPITAL_COMMUNITY): Payer: Self-pay | Admitting: Hematology

## 2019-02-24 ENCOUNTER — Inpatient Hospital Stay (HOSPITAL_COMMUNITY): Payer: 59 | Attending: Hematology | Admitting: Hematology

## 2019-02-24 VITALS — BP 148/62 | HR 82

## 2019-02-24 DIAGNOSIS — Z5112 Encounter for antineoplastic immunotherapy: Secondary | ICD-10-CM | POA: Diagnosis present

## 2019-02-24 DIAGNOSIS — Z452 Encounter for adjustment and management of vascular access device: Secondary | ICD-10-CM | POA: Insufficient documentation

## 2019-02-24 DIAGNOSIS — C187 Malignant neoplasm of sigmoid colon: Secondary | ICD-10-CM | POA: Diagnosis not present

## 2019-02-24 DIAGNOSIS — C787 Secondary malignant neoplasm of liver and intrahepatic bile duct: Secondary | ICD-10-CM

## 2019-02-24 DIAGNOSIS — R160 Hepatomegaly, not elsewhere classified: Secondary | ICD-10-CM

## 2019-02-24 DIAGNOSIS — E1140 Type 2 diabetes mellitus with diabetic neuropathy, unspecified: Secondary | ICD-10-CM | POA: Diagnosis not present

## 2019-02-24 DIAGNOSIS — I10 Essential (primary) hypertension: Secondary | ICD-10-CM | POA: Diagnosis not present

## 2019-02-24 DIAGNOSIS — Z5111 Encounter for antineoplastic chemotherapy: Secondary | ICD-10-CM | POA: Diagnosis present

## 2019-02-24 LAB — COMPREHENSIVE METABOLIC PANEL
ALT: 36 U/L (ref 0–44)
AST: 30 U/L (ref 15–41)
Albumin: 4.1 g/dL (ref 3.5–5.0)
Alkaline Phosphatase: 85 U/L (ref 38–126)
Anion gap: 13 (ref 5–15)
BUN: 18 mg/dL (ref 8–23)
CO2: 22 mmol/L (ref 22–32)
Calcium: 9.8 mg/dL (ref 8.9–10.3)
Chloride: 105 mmol/L (ref 98–111)
Creatinine, Ser: 1.07 mg/dL (ref 0.61–1.24)
GFR calc Af Amer: >60 mL/min (ref >60)
GFR calc non Af Amer: >60 mL/min (ref >60)
Glucose, Bld: 152 mg/dL — ABNORMAL HIGH (ref 70–99)
Potassium: 3.6 mmol/L (ref 3.5–5.1)
Sodium: 140 mmol/L (ref 135–145)
Total Bilirubin: 0.4 mg/dL (ref 0.3–1.2)
Total Protein: 7.5 g/dL (ref 6.5–8.1)

## 2019-02-24 LAB — CBC WITH DIFFERENTIAL/PLATELET
Abs Immature Granulocytes: 0.03 10*3/uL (ref 0.00–0.07)
Basophils Absolute: 0 10*3/uL (ref 0.0–0.1)
Basophils Relative: 0 %
Eosinophils Absolute: 0.1 10*3/uL (ref 0.0–0.5)
Eosinophils Relative: 1 %
HCT: 40.3 % (ref 39.0–52.0)
Hemoglobin: 13 g/dL (ref 13.0–17.0)
Immature Granulocytes: 0 %
Lymphocytes Relative: 36 %
Lymphs Abs: 2.9 10*3/uL (ref 0.7–4.0)
MCH: 30.1 pg (ref 26.0–34.0)
MCHC: 32.3 g/dL (ref 30.0–36.0)
MCV: 93.3 fL (ref 80.0–100.0)
Monocytes Absolute: 0.8 10*3/uL (ref 0.1–1.0)
Monocytes Relative: 10 %
Neutro Abs: 4.1 10*3/uL (ref 1.7–7.7)
Neutrophils Relative %: 53 %
Platelets: 191 10*3/uL (ref 150–400)
RBC: 4.32 MIL/uL (ref 4.22–5.81)
RDW: 18 % — ABNORMAL HIGH (ref 11.5–15.5)
WBC: 7.9 10*3/uL (ref 4.0–10.5)
nRBC: 0 % (ref 0.0–0.2)

## 2019-02-24 LAB — URINALYSIS, DIPSTICK ONLY
Bilirubin Urine: NEGATIVE
Glucose, UA: >=500 mg/dL — AB
Hgb urine dipstick: NEGATIVE
Ketones, ur: NEGATIVE mg/dL
Leukocytes,Ua: NEGATIVE
Nitrite: NEGATIVE
Protein, ur: 30 mg/dL — AB
Specific Gravity, Urine: 1.025 (ref 1.005–1.030)
pH: 5 (ref 5.0–8.0)

## 2019-02-24 MED ORDER — ATROPINE SULFATE 1 MG/ML IJ SOLN
1.0000 mg | Freq: Once | INTRAMUSCULAR | Status: AC
  Administered 2019-02-24: 1 mg via INTRAVENOUS
  Filled 2019-02-24: qty 1

## 2019-02-24 MED ORDER — FLUOROURACIL CHEMO INJECTION 2.5 GM/50ML
400.0000 mg/m2 | Freq: Once | INTRAVENOUS | Status: AC
  Administered 2019-02-24: 800 mg via INTRAVENOUS
  Filled 2019-02-24: qty 16

## 2019-02-24 MED ORDER — SODIUM CHLORIDE 0.9 % IV SOLN
5.0000 mg/kg | Freq: Once | INTRAVENOUS | Status: AC
  Administered 2019-02-24: 450 mg via INTRAVENOUS
  Filled 2019-02-24: qty 18

## 2019-02-24 MED ORDER — SODIUM CHLORIDE 0.9 % IV SOLN
800.0000 mg | Freq: Once | INTRAVENOUS | Status: AC
  Administered 2019-02-24: 800 mg via INTRAVENOUS
  Filled 2019-02-24: qty 250

## 2019-02-24 MED ORDER — SODIUM CHLORIDE 0.9% FLUSH
10.0000 mL | INTRAVENOUS | Status: DC | PRN
  Administered 2019-02-24: 10 mL
  Filled 2019-02-24: qty 10

## 2019-02-24 MED ORDER — SODIUM CHLORIDE 0.9 % IV SOLN
180.0000 mg/m2 | Freq: Once | INTRAVENOUS | Status: AC
  Administered 2019-02-24: 360 mg via INTRAVENOUS
  Filled 2019-02-24: qty 15

## 2019-02-24 MED ORDER — SODIUM CHLORIDE 0.9 % IV SOLN
Freq: Once | INTRAVENOUS | Status: AC
  Administered 2019-02-24: 09:00:00 via INTRAVENOUS

## 2019-02-24 MED ORDER — SODIUM CHLORIDE 0.9 % IV SOLN
2470.0000 mg/m2 | INTRAVENOUS | Status: DC
  Administered 2019-02-24: 14:00:00 5000 mg via INTRAVENOUS
  Filled 2019-02-24: qty 100

## 2019-02-24 MED ORDER — LORAZEPAM 2 MG/ML IJ SOLN
1.0000 mg | Freq: Once | INTRAMUSCULAR | Status: AC
  Administered 2019-02-24: 1 mg via INTRAVENOUS
  Filled 2019-02-24: qty 1

## 2019-02-24 MED ORDER — PALONOSETRON HCL INJECTION 0.25 MG/5ML
0.2500 mg | Freq: Once | INTRAVENOUS | Status: AC
  Administered 2019-02-24: 0.25 mg via INTRAVENOUS
  Filled 2019-02-24: qty 5

## 2019-02-24 MED ORDER — SODIUM CHLORIDE 0.9 % IV SOLN
Freq: Once | INTRAVENOUS | Status: AC
  Administered 2019-02-24: 10:00:00 via INTRAVENOUS
  Filled 2019-02-24: qty 5

## 2019-02-24 NOTE — Assessment & Plan Note (Addendum)
1.  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  Pathology was consistent with invasive moderately differentiated adenocarcinoma arising in a tubular adenoma with tumor invading the submucosa with no lymphovascular invasion.  Cauterized resection margin is positive for tumor. -Preoperative CEA on 09/12/2018 was 46.5. - Sigmoid colon segmental resection on 09/17/2018 showing no residual carcinoma in the sigmoid colon, margins uninvolved, 3 tumor deposits present and 1/12 lymph nodes positive.  Liver biopsy was consistent with metastatic carcinoma. - He never had any history of bleeding per rectum or melena.  No family history of colorectal cancer.  Brother died of lymphoma. - MRI of the liver on 10/21/2018 shows complex but nonenhancing lesion at the prior wedge biopsy site measuring 3.1 x 2.3 cm, with a complex cystic cavity with fluid fluid level. - CT CAP shows small nodule in the right upper lobe of the lung measuring 5 mm.  Small amount of soft tissue at the anastomotic site likely postsurgical in etiology. - PET CT scan on 10/30/2018 did not show any distant metastatic disease other than in the liver.   - 6 cycles of FOLFIRI and bevacizumab from 11/03/2018 through 01/13/2019. -Last CEA on 12/15/2018 was 2. - CT of the chest on 01/23/2019 showed 2-4 mm pulmonary nodule in the right lung, stable from prior study.  No other abnormalities were seen related to malignancy. -MRI of the liver on 01/20/2019 shows decrease in size of the subcapsular liver metastasis, measuring 2 x 1.9 cm, previously 2.9 x 2.8 cm.  No new areas were seen. - He has tolerated last cycle very well.  He has mild fatigue. -I reviewed blood work.  He may proceed with cycle 7 today.  We will plan to make a referral to Dr. Jyl Heinz at [facility] for surgical resection of the liver lesion upon completion of cycle 12. I will see him back in 2 weeks for follow-up.  2.  Peripheral neuropathy: -He has grade 1 neuropathy in the feet from longstanding diabetes. -Apparently this numbness has gotten slightly worse in the last 2 weeks after cycle 6.  We will keep a close eye on it.  3.  Hypertension: - Blood pressure today is 142/70.  He will continue Altace and HCTZ.  He will also continue Norvasc.

## 2019-02-24 NOTE — Patient Instructions (Signed)
[place] at [facility] Discharge Instructions  You were seen today by Dr. Delton Coombes. He went over your recent lab results. He would like you to start taking the nausea medication at home prior to coming for your treatments. He will see you back in 2 weeks for labs, treatment and follow up.   Thank you for choosing [place] at [facility] to provide your oncology and hematology care.  To afford each patient quality time with our provider, please arrive at least 15 minutes before your scheduled appointment time.   If you have a lab appointment with the [facility] please come in thru the  Main Entrance and check in at the main information desk  You need to re-schedule your appointment should you arrive 10 or more minutes late.  We strive to give you quality time with our providers, and arriving late affects you and other patients whose appointments are after yours.  Also, if you no show three or more times for appointments you may be dismissed from the clinic at the providers discretion.     Again, thank you for choosing [facility].  Our hope is that these requests will decrease the amount of time that you wait before being seen by our physicians.       _____________________________________________________________  Should you have questions after your visit to [facility], please contact our office at [phone] between the hours of 8:00 a.m. and 4:30 p.m.  Voicemails left after 4:00 p.m. will not be returned until the following business day.  For prescription refill requests, have your pharmacy contact our office and allow 72 hours.    Cancer Center Support Programs:   > Cancer Support Group  2nd Tuesday of the month 1pm-2pm, Journey Room

## 2019-02-24 NOTE — Patient Instructions (Signed)
[facility] at [facility]  Discharge Instructions:   _______________________________________________________________  Thank you for choosing [facility] at [facility] to provide your oncology and hematology care.  To afford each patient quality time with our providers, please arrive at least 15 minutes before your scheduled appointment.  You need to re-schedule your appointment if you arrive 10 or more minutes late.  We strive to give you quality time with our providers, and arriving late affects you and other patients whose appointments are after yours.  Also, if you no show three or more times for appointments you may be dismissed from the clinic.  Again, thank you for choosing [facility]. Our hope is that these requests will allow you access to exceptional care and in a timely manner. _______________________________________________________________  If you have questions after your visit, please contact our office at [phone] between the hours of 8:30 a.m. and 5:00 p.m. Voicemails left after 4:30 p.m. will not be returned until the following business day. _______________________________________________________________  For prescription refill requests, have your pharmacy contact our office. _______________________________________________________________  Recommendations made by the consultant and any test results will be sent to your referring physician. _______________________________________________________________ 

## 2019-02-24 NOTE — Progress Notes (Signed)
[place], [place] [address]   CLINIC:  Medical Oncology/Hematology  PCP:  Sharilyn Sites, MD [place] [phone]   REASON FOR VISIT:  Follow-up for metastatic sigmoid colon cancer to the liver  CURRENT THERAPY:Folfiri and bevacizumab (AVASTIN)   BRIEF ONCOLOGIC HISTORY:    Malignant neoplasm of sigmoid colon ([place])   09/17/2018 Initial Diagnosis    Malignant neoplasm of sigmoid colon ([place])    11/03/2018 -  Chemotherapy    The patient had palonosetron (ALOXI) injection 0.25 mg, 0.25 mg, Intravenous,  Once, 9 of 12 cycles Administration: 0.25 mg (11/03/2018), 0.25 mg (11/18/2018), 0.25 mg (12/01/2018), 0.25 mg (12/15/2018), 0.25 mg (12/30/2018), 0.25 mg (01/13/2019), 0.25 mg (01/27/2019), 0.25 mg (02/10/2019), 0.25 mg (02/24/2019) bevacizumab (AVASTIN) 450 mg in sodium chloride 0.9 % 100 mL chemo infusion, 5 mg/kg = 450 mg, Intravenous,  Once, 9 of 12 cycles Administration: 450 mg (11/03/2018), 450 mg (11/18/2018), 450 mg (12/01/2018), 450 mg (12/15/2018), 450 mg (01/13/2019), 450 mg (01/27/2019), 450 mg (02/10/2019), 450 mg (02/24/2019) irinotecan (CAMPTOSAR) 360 mg in sodium chloride 0.9 % 500 mL chemo infusion, 180 mg/m2 = 360 mg, Intravenous,  Once, 9 of 12 cycles Administration: 360 mg (11/03/2018), 360 mg (11/18/2018), 360 mg (12/01/2018), 360 mg (12/15/2018), 360 mg (12/30/2018), 360 mg (01/13/2019), 360 mg (01/27/2019), 360 mg (02/10/2019), 360 mg (02/24/2019) leucovorin 800 mg in sodium chloride 0.9 % 250 mL infusion, 808 mg, Intravenous,  Once, 9 of 12 cycles Administration: 800 mg (11/03/2018), 800 mg (11/18/2018), 800 mg (12/01/2018), 800 mg (12/15/2018), 800 mg (12/30/2018), 800 mg (01/13/2019), 800 mg (01/27/2019), 800 mg (02/10/2019), 800 mg (02/24/2019) fosaprepitant (EMEND) 150 mg in sodium chloride 0.9 % 145 mL IVPB, 150 mg, Intravenous,  Once, 1 of 1 cycle Administration: 150 mg (12/01/2018) fluorouracil (ADRUCIL) chemo  injection 800 mg, 400 mg/m2 = 800 mg, Intravenous,  Once, 9 of 12 cycles Administration: 800 mg (11/03/2018), 800 mg (11/18/2018), 800 mg (12/01/2018), 800 mg (12/15/2018), 800 mg (12/30/2018), 800 mg (01/13/2019), 800 mg (01/27/2019), 800 mg (02/10/2019), 800 mg (02/24/2019) fosaprepitant (EMEND) 150 mg, dexamethasone (DECADRON) 10 mg in sodium chloride 0.9 % 145 mL IVPB, , Intravenous,  Once, 6 of 9 cycles Administration:  (12/15/2018),  (12/30/2018),  (01/13/2019),  (01/27/2019),  (02/10/2019),  (02/24/2019) fluorouracil (ADRUCIL) 4,850 mg in sodium chloride 0.9 % 53 mL chemo infusion, 2,400 mg/m2 = 4,850 mg, Intravenous, 1 Day/Dose, 9 of 12 cycles Administration: 4,850 mg (11/03/2018), 4,850 mg (11/18/2018), 4,850 mg (12/01/2018), 4,850 mg (12/15/2018), 4,850 mg (12/30/2018), 4,850 mg (01/13/2019), 4,850 mg (01/27/2019), 4,850 mg (02/10/2019), 5,000 mg (02/24/2019)  for chemotherapy treatment.       CANCER STAGING: Cancer Staging No matching staging information was found for the patient.   INTERVAL HISTORY:  Ian [name] 63 y.o. male returns for routine follow-up and consideration for next cycle of chemotherapy.He is here today alone. He states that he needs something for nausea, he gets nauseated just thinking about coming for treatment. He states that his toes are completely numb. Denies any nausea, vomiting, or diarrhea. Denies any new pains. Had not noticed any recent bleeding such as epistaxis, hematuria or hematochezia. Denies recent chest pain on exertion, shortness of breath on minimal exertion, pre-syncopal episodes, or palpitations. Denies any numbness or tingling in hands. Denies any recent fevers, infections, or recent hospitalizations. Patient reports appetite at 100% and energy level at 50%.     REVIEW OF SYSTEMS:  Review of Systems  Constitutional: Positive for fatigue.  Gastrointestinal: Positive for nausea.  Neurological: Positive for numbness.     PAST MEDICAL/SURGICAL HISTORY:  Past  Medical History:  Diagnosis Date  . 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 Colon cancer ([place])   . Complication of anesthesia    atypical pseudo-cholinesterase deficiency  . Diabetes mellitus without complication ([place])   . Family history of adverse reaction to anesthesia    sister also has atypical pseudo-cholinesterase deficiency  . GERD (gastroesophageal reflux disease)   . History of heart bypass surgery   . Hypertension    Past Surgical History:  Procedure Laterality Date  . COLONOSCOPY N/A 08/29/2018   Procedure: COLONOSCOPY;  Surgeon: Daneil Dolin, MD;  Location: [facility] ENDO SU[facility];  Service: Endoscopy;  Laterality: N/A;  9:30  . CORONARY ARTERY BYPASS GRAFT     4 vessels  . heart bypass    . INSERTION OF MESH  02/07/2015   Procedure: INSERTION OF MESH;  Surgeon: Aviva Signs Md, MD;  Location: [facility] ORS;  Service: General;;  . PARTIAL COLECTOMY N/A 09/17/2018   Procedure: PARTIAL COLECTOMY WITH PARTIAL WEDGE RESECTION LIVER METASTASIS;  Surgeon: Aviva Signs, MD;  Location: [facility] ORS;  Service: General;  Laterality: N/A;  . POLYPECTOMY  08/29/2018   Procedure: POLYPECTOMY;  Surgeon: Daneil Dolin, MD;  Location: [facility] ENDO SUITE;  Service: Endoscopy;;  . PORTACATH PLACEMENT Left 10/10/2018   Procedure: INSERTION PORT-A-CATH (catheter in left subclavian);  Surgeon: Aviva Signs, MD;  Location: [facility] ORS;  Service: General;  Laterality: Left;  . UMBILICAL HERNIA REPAIR N/A 02/07/2015   Procedure: UMBILICAL HERNIORRHAPHY WITH MESH;  Surgeon: Aviva Signs Md, MD;  Location: [facility] ORS;  Service: General;  Laterality: N/A;     SOCIAL HISTORY:  Social History   Socioeconomic History  . Marital status: Married    Spouse name: Not on file  . Number of children: 3  . Years of education: Not on file  . Highest education level: Not on file  Occupational History  . 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 Financial resource strain: Not hard at all  .  Food insecurity:    Worry: Never true    Inability: Never true  . Transportation needs:    Medical: No    Non-medical: No  Tobacco Use  . Smoking status: Never Smoker  . Smokeless tobacco: Never Used  Substance and Sexual Activity  . Alcohol use: No  . Drug use: No  . Sexual activity: Yes    Birth control/protection: None  Lifestyle  . Physical activity:    Days per week: 0 days    Minutes per session: 0 min  . Stress: Not at all  Relationships  . Social connections:    Talks on phone: More than three times a week    Gets together: More than three times a week    Attends religious service: Never    Active member of club or organization: No    Attends meetings of clubs or organizations: Never    Relationship status: Married  . Intimate partner violence:    Fear of current or ex partner: No    Emotionally abused: No    Physically abused: No    Forced sexual activity: No  Other Topics Concern  . Not on file  Social History Narrative  . Not on file    FAMILY HISTORY:  Family History  Problem Relation Age of Onset  . Hypertension Father   . Diabetes Father   .  Heart disease Father   . Diabetes Sister   . Hypertension Sister   . Hypertension Brother   . Diabetes Brother   . Diabetes Sister   . Diabetes Brother   . Heart disease Brother   . Lymphoma Brother   . Hypertension Son     CURRENT MEDICATIONS:  Outpatient Encounter Medications as of 02/24/2019  Medication Sig Note  . amLODipine (NORVASC) 10 MG tablet Take 1 tablet (10 mg total) by mouth daily.   Marland Kitchen aspirin EC 81 MG tablet Take 81 mg by mouth daily.   . B-D UF III MINI PEN NEEDLES 31G X 5 MM MISC    . Bevacizumab (AVASTIN IV) Inject 450 mg into the vein every 14 (fourteen) days.   . Coenzyme Q10 (COQ10) 100 MG CAPS Take 100 mg by mouth daily.   Marland Kitchen diltiazem (CARDIZEM CD) 240 MG 24 hr capsule Take 240 mg by mouth daily.   . diphenhydramine-acetaminophen (TYLENOL PM) 25-500 MG TABS tablet Take 2 tablets by  mouth at bedtime.   Marland Kitchen ezetimibe-simvastatin (VYTORIN) 10-80 MG per tablet Take 1 tablet by mouth at bedtime.   Marland Kitchen FARXIGA 10 MG TABS tablet    . GAVILYTE-N WITH FLAVOR PACK 420 g solution    . HUMALOG MIX 75/25 KWIKPEN (75-25) 100 UNIT/ML Kwikpen    . hydrochlorothiazide (MICROZIDE) 12.5 MG capsule Take 12.5 mg by mouth daily.   . Insulin Glargine (BASAGLAR KWIKPEN) 100 UNIT/ML SOPN    . insulin glargine (LANTUS SOLOSTAR) 100 UNIT/ML injection Inject 20 Units into the skin at bedtime.  10/10/2018: Took 10 units  . insulin lispro protamine-lispro (HUMALOG 75/25 MIX) (75-25) 100 UNIT/ML SUSP injection Inject 16 Units into the skin 3 (three) times daily. 10/10/2018: 1/2 dose  . IRINOTECAN HCL IV Inject 360 mg into the vein every 14 (fourteen) days.   Marland Kitchen JANUVIA 100 MG tablet    . LEUCOVORIN CALCIUM IV Inject 808 mg into the vein every 14 (fourteen) days.   Marland Kitchen lidocaine-prilocaine (EMLA) cream Apply small amount over port site one hour prior to appointment. Cover with plastic wrap.   . loperamide (IMODIUM A-D) 2 MG tablet Take 2 capsules after first loose stool and 1 capsule after each loose stool thereafter.   . metFORMIN (GLUCOPHAGE) 1000 MG tablet Take 1,000 mg by mouth 2 (two) times daily.    . metoprolol succinate (TOPROL-XL) 50 MG 24 hr tablet Take 75 mg by mouth 2 (two) times daily.   . niacin (NIASPAN) 500 MG CR tablet Take 500 mg by mouth at bedtime.   Marland Kitchen NOVOLOG MIX 70/30 FLEXPEN (70-30) 100 UNIT/ML FlexPen    . Omega-3 Fatty Acids (FISH OIL PO) Take 1 capsule by mouth daily.   . pantoprazole (PROTONIX) 40 MG tablet Take 40 mg by mouth daily.   . prochlorperazine (COMPAZINE) 10 MG tablet Take 1 tablet (10 mg total) by mouth every 6 (six) hours as needed (NAUSEA).   . ramipril (ALTACE) 10 MG capsule Take 10 mg by mouth daily.   . sodium chloride 0.9 % SOLN with fluorouracil 5 GM/100ML SOLN 200 mg/m2/day Inject 5,650 mg into the vein. Over 46 hours    No facility-administered encounter  medications on file as of 02/24/2019.     ALLERGIES:  Allergies  Allergen Reactions  . Anectine [Succinylcholine] Other (See Comments)    Atypical pseudocholinesterase deficiency.      PHYSICAL EXAM:  ECOG Performance status: 1  Vitals:   02/24/19 0820  BP: (!) 142/70  Pulse: 86  Resp: 18  Temp: 98 F (36.7 C)  SpO2: 98%   Filed Weights   02/24/19 0820  Weight: 206 lb 3.2 oz (93.5 kg)    Physical Exam Vitals signs reviewed.  Constitutional:      Appearance: Normal appearance.  Cardiovascular:     Rate and Rhythm: Normal rate and regular rhythm.     Heart sounds: Normal heart sounds.  Pulmonary:     Effort: Pulmonary effort is normal.     Breath sounds: Normal breath sounds.  Abdominal:     General: There is no distension.     Palpations: Abdomen is soft. There is no mass.  Musculoskeletal:        General: No swelling.  Skin:    General: Skin is warm.  Neurological:     General: No focal deficit present.     Mental Status: He is alert and oriented to person, place, and time.  Psychiatric:        Mood and Affect: Mood normal.        Behavior: Behavior normal.      LABORATORY DATA:  I have reviewed the labs as listed.  CBC    Component Value Date/Time   WBC 7.9 02/24/2019 0805   RBC 4.32 02/24/2019 0805   HGB 13.0 02/24/2019 0805   HCT 40.3 02/24/2019 0805   PLT 191 02/24/2019 0805   MCV 93.3 02/24/2019 0805   MCH 30.1 02/24/2019 0805   MCHC 32.3 02/24/2019 0805   RDW 18.0 (H) 02/24/2019 0805   LYMPHSABS 2.9 02/24/2019 0805   MONOABS 0.8 02/24/2019 0805   EOSABS 0.1 02/24/2019 0805   BASOSABS 0.0 02/24/2019 0805   CMP Latest Ref Rng & Units 02/24/2019 02/10/2019 01/23/2019  Glucose 70 - 99 mg/dL 152(H) 224(H) 113(H)  BUN 8 - 23 mg/dL '18 17 10  ' Creatinine 0.61 - 1.24 mg/dL 1.07 1.03 0.85  Sodium 135 - 145 mmol/L 140 139 142  Potassium 3.5 - 5.1 mmol/L 3.6 3.7 3.5  Chloride 98 - 111 mmol/L 105 105 107  CO2 22 - 32 mmol/L 22 21(L) 24  Calcium 8.9 -  10.3 mg/dL 9.8 9.6 9.4  Total Protein 6.5 - 8.1 g/dL 7.5 7.4 7.1  Total Bilirubin 0.3 - 1.2 mg/dL 0.4 0.5 0.5  Alkaline Phos 38 - 126 U/L 85 83 75  AST 15 - 41 U/L '30 23 29  ' ALT 0 - 44 U/L 36 31 31       DIAGNOSTIC IMAGING:  I have independently reviewed the scans and discussed with the patient.   I have reviewed Venita Lick LPN's note and agree with the documentation.  I personally performed a face-to-face visit, made revisions and my assessment and plan is as follows.    ASSESSMENT & PLAN:   Malignant neoplasm of sigmoid colon ([place]) 1.  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  Pathology was consistent with invasive moderately differentiated adenocarcinoma arising in a tubular adenoma with tumor invading the submucosa with no lymphovascular invasion.  Cauterized resection margin is positive for tumor. -Preoperative CEA on 09/12/2018 was 46.5. - Sigmoid colon segmental resection on 09/17/2018 showing no residual carcinoma in the sigmoid colon, margins uninvolved, 3 tumor deposits present and 1/12 lymph nodes positive.  Liver biopsy was consistent with metastatic carcinoma. - He never  had any history of bleeding per rectum or melena.  No family history of colorectal cancer.  Brother died of lymphoma. - MRI of the liver on 10/21/2018 shows complex but nonenhancing lesion at the prior wedge biopsy site measuring 3.1 x 2.3 cm, with a complex cystic cavity with fluid fluid level. - CT CAP shows small nodule in the right upper lobe of the lung measuring 5 mm.  Small amount of soft tissue at the anastomotic site likely postsurgical in etiology. - PET CT scan on 10/30/2018 did not show any distant metastatic disease other than in the liver.   - 6 cycles of FOLFIRI and  bevacizumab from 11/03/2018 through 01/13/2019. -Last CEA on 12/15/2018 was 2. - CT of the chest on 01/23/2019 showed 2-4 mm pulmonary nodule in the right lung, stable from prior study.  No other abnormalities were seen related to malignancy. -MRI of the liver on 01/20/2019 shows decrease in size of the subcapsular liver metastasis, measuring 2 x 1.9 cm, previously 2.9 x 2.8 cm.  No new areas were seen. - He has tolerated last cycle very well.  He has mild fatigue. -I reviewed blood work.  He may proceed with cycle 7 today.  We will plan to make a referral to Dr. Jyl Heinz at [facility] for surgical resection of the liver lesion upon completion of cycle 12. I will see him back in 2 weeks for follow-up.  2.  Peripheral neuropathy: -He has grade 1 neuropathy in the feet from longstanding diabetes. -Apparently this numbness has gotten slightly worse in the last 2 weeks after cycle 6.  We will keep a close eye on it.  3.  Hypertension: - Blood pressure today is 142/70.  He will continue Altace and HCTZ.  He will also continue Norvasc.      Orders placed this encounter:  No orders of the defined types were placed in this encounter.     Ian Jack, MD [place] [phone]

## 2019-02-24 NOTE — Progress Notes (Signed)
Patient seen by DR. Katragadda with lab review and ok to treat today.   Patient tolerated chemotherapy with no complaints voiced.  Port site clean and dry with no bruising or swelling noted at site.  Good blood return noted before and after administration of chemotherapy.  Chemotherapy pump connected with no alarms noted.   Patient left ambulatory with VSS and no s/s of distress noted.

## 2019-02-25 ENCOUNTER — Other Ambulatory Visit: Payer: Self-pay

## 2019-02-25 MED ORDER — OCTREOTIDE ACETATE 30 MG IM KIT
PACK | INTRAMUSCULAR | Status: AC
  Filled 2019-02-25: qty 2

## 2019-02-26 ENCOUNTER — Encounter (HOSPITAL_COMMUNITY): Payer: Self-pay

## 2019-02-26 ENCOUNTER — Inpatient Hospital Stay (HOSPITAL_COMMUNITY): Payer: 59

## 2019-02-26 VITALS — BP 147/73 | HR 87 | Temp 97.7°F | Resp 18

## 2019-02-26 DIAGNOSIS — R160 Hepatomegaly, not elsewhere classified: Secondary | ICD-10-CM

## 2019-02-26 DIAGNOSIS — Z5112 Encounter for antineoplastic immunotherapy: Secondary | ICD-10-CM | POA: Diagnosis not present

## 2019-02-26 DIAGNOSIS — C187 Malignant neoplasm of sigmoid colon: Secondary | ICD-10-CM

## 2019-02-26 MED ORDER — HEPARIN SOD (PORK) LOCK FLUSH 100 UNIT/ML IV SOLN
500.0000 [IU] | Freq: Once | INTRAVENOUS | Status: AC | PRN
  Administered 2019-02-26: 14:00:00 500 [IU]

## 2019-02-26 MED ORDER — SODIUM CHLORIDE 0.9% FLUSH
10.0000 mL | INTRAVENOUS | Status: DC | PRN
  Administered 2019-02-26: 10 mL
  Filled 2019-02-26: qty 10

## 2019-02-26 NOTE — Progress Notes (Signed)
Ian [name] tolerated 5FU pump well without complaints or incident. 5FU pump discontinued with portacath flushed easily per protocol. VSS Pt discharged self ambulatory in satisfactory condition

## 2019-02-26 NOTE — Patient Instructions (Signed)
[place] at [facility] Discharge Instructions  5FU pump discontinued today with portacath flushed per protocol. Follow-up as scheduled. Call clinic for any questions or concerns   Thank you for choosing [place] at [facility] to provide your oncology and hematology care.  To afford each patient quality time with our provider, please arrive at least 15 minutes before your scheduled appointment time.   If you have a lab appointment with the [facility] please come in thru the  Main Entrance and check in at the main information desk  You need to re-schedule your appointment should you arrive 10 or more minutes late.  We strive to give you quality time with our providers, and arriving late affects you and other patients whose appointments are after yours.  Also, if you no show three or more times for appointments you may be dismissed from the clinic at the providers discretion.     Again, thank you for choosing [facility].  Our hope is that these requests will decrease the amount of time that you wait before being seen by our physicians.       _____________________________________________________________  Should you have questions after your visit to [facility], please contact our office at [phone] between the hours of 8:00 a.m. and 4:30 p.m.  Voicemails left after 4:00 p.m. will not be returned until the following business day.  For prescription refill requests, have your pharmacy contact our office and allow 72 hours.    Cancer Center Support Programs:   > Cancer Support Group  2nd Tuesday of the month 1pm-2pm, Journey Room

## 2019-03-09 ENCOUNTER — Other Ambulatory Visit: Payer: Self-pay

## 2019-03-10 ENCOUNTER — Encounter (HOSPITAL_COMMUNITY): Payer: Self-pay | Admitting: Hematology

## 2019-03-10 ENCOUNTER — Inpatient Hospital Stay (HOSPITAL_COMMUNITY): Payer: 59

## 2019-03-10 ENCOUNTER — Encounter (HOSPITAL_COMMUNITY): Payer: Self-pay | Admitting: Lab

## 2019-03-10 ENCOUNTER — Inpatient Hospital Stay (HOSPITAL_BASED_OUTPATIENT_CLINIC_OR_DEPARTMENT_OTHER): Payer: 59 | Admitting: Hematology

## 2019-03-10 VITALS — BP 146/65 | HR 76 | Temp 97.7°F | Resp 18

## 2019-03-10 DIAGNOSIS — E1140 Type 2 diabetes mellitus with diabetic neuropathy, unspecified: Secondary | ICD-10-CM | POA: Diagnosis not present

## 2019-03-10 DIAGNOSIS — R160 Hepatomegaly, not elsewhere classified: Secondary | ICD-10-CM

## 2019-03-10 DIAGNOSIS — C187 Malignant neoplasm of sigmoid colon: Secondary | ICD-10-CM

## 2019-03-10 DIAGNOSIS — C787 Secondary malignant neoplasm of liver and intrahepatic bile duct: Secondary | ICD-10-CM

## 2019-03-10 DIAGNOSIS — Z5112 Encounter for antineoplastic immunotherapy: Secondary | ICD-10-CM | POA: Diagnosis not present

## 2019-03-10 DIAGNOSIS — I10 Essential (primary) hypertension: Secondary | ICD-10-CM | POA: Diagnosis not present

## 2019-03-10 LAB — CBC WITH DIFFERENTIAL/PLATELET
Abs Immature Granulocytes: 0.05 10*3/uL (ref 0.00–0.07)
Basophils Absolute: 0.1 10*3/uL (ref 0.0–0.1)
Basophils Relative: 1 %
Eosinophils Absolute: 0.1 10*3/uL (ref 0.0–0.5)
Eosinophils Relative: 1 %
HCT: 37.8 % — ABNORMAL LOW (ref 39.0–52.0)
Hemoglobin: 12.4 g/dL — ABNORMAL LOW (ref 13.0–17.0)
Immature Granulocytes: 1 %
Lymphocytes Relative: 33 %
Lymphs Abs: 2.6 10*3/uL (ref 0.7–4.0)
MCH: 31.3 pg (ref 26.0–34.0)
MCHC: 32.8 g/dL (ref 30.0–36.0)
MCV: 95.5 fL (ref 80.0–100.0)
Monocytes Absolute: 0.9 10*3/uL (ref 0.1–1.0)
Monocytes Relative: 11 %
Neutro Abs: 4.3 10*3/uL (ref 1.7–7.7)
Neutrophils Relative %: 53 %
Platelets: 195 10*3/uL (ref 150–400)
RBC: 3.96 MIL/uL — ABNORMAL LOW (ref 4.22–5.81)
RDW: 17.4 % — ABNORMAL HIGH (ref 11.5–15.5)
WBC: 7.9 10*3/uL (ref 4.0–10.5)
nRBC: 0 % (ref 0.0–0.2)

## 2019-03-10 LAB — COMPREHENSIVE METABOLIC PANEL
ALT: 46 U/L — ABNORMAL HIGH (ref 0–44)
AST: 28 U/L (ref 15–41)
Albumin: 4.1 g/dL (ref 3.5–5.0)
Alkaline Phosphatase: 78 U/L (ref 38–126)
Anion gap: 11 (ref 5–15)
BUN: 22 mg/dL (ref 8–23)
CO2: 22 mmol/L (ref 22–32)
Calcium: 9.3 mg/dL (ref 8.9–10.3)
Chloride: 104 mmol/L (ref 98–111)
Creatinine, Ser: 1.04 mg/dL (ref 0.61–1.24)
GFR calc Af Amer: >60 mL/min (ref >60)
GFR calc non Af Amer: >60 mL/min (ref >60)
Glucose, Bld: 227 mg/dL — ABNORMAL HIGH (ref 70–99)
Potassium: 4.1 mmol/L (ref 3.5–5.1)
Sodium: 137 mmol/L (ref 135–145)
Total Bilirubin: 0.4 mg/dL (ref 0.3–1.2)
Total Protein: 7.2 g/dL (ref 6.5–8.1)

## 2019-03-10 LAB — URINALYSIS, DIPSTICK ONLY
Bilirubin Urine: NEGATIVE
Glucose, UA: >=500 mg/dL — AB
Hgb urine dipstick: NEGATIVE
Ketones, ur: NEGATIVE mg/dL
Leukocytes,Ua: NEGATIVE
Nitrite: NEGATIVE
Protein, ur: 30 mg/dL — AB
Specific Gravity, Urine: 1.025 (ref 1.005–1.030)
pH: 5 (ref 5.0–8.0)

## 2019-03-10 MED ORDER — SODIUM CHLORIDE 0.9 % IV SOLN
2470.0000 mg/m2 | INTRAVENOUS | Status: DC
  Administered 2019-03-10: 5000 mg via INTRAVENOUS
  Filled 2019-03-10: qty 100

## 2019-03-10 MED ORDER — ATROPINE SULFATE 1 MG/ML IJ SOLN
1.0000 mg | Freq: Once | INTRAMUSCULAR | Status: AC
  Administered 2019-03-10: 1 mg via INTRAVENOUS
  Filled 2019-03-10: qty 1

## 2019-03-10 MED ORDER — LORAZEPAM 2 MG/ML IJ SOLN
1.0000 mg | Freq: Once | INTRAMUSCULAR | Status: AC
  Administered 2019-03-10: 1 mg via INTRAVENOUS
  Filled 2019-03-10: qty 1

## 2019-03-10 MED ORDER — SODIUM CHLORIDE 0.9 % IV SOLN
800.0000 mg | Freq: Once | INTRAVENOUS | Status: AC
  Administered 2019-03-10: 800 mg via INTRAVENOUS
  Filled 2019-03-10: qty 40

## 2019-03-10 MED ORDER — FLUOROURACIL CHEMO INJECTION 2.5 GM/50ML
400.0000 mg/m2 | Freq: Once | INTRAVENOUS | Status: AC
  Administered 2019-03-10: 800 mg via INTRAVENOUS
  Filled 2019-03-10: qty 16

## 2019-03-10 MED ORDER — LORAZEPAM 2 MG/ML IJ SOLN
INTRAMUSCULAR | Status: AC
  Filled 2019-03-10: qty 1

## 2019-03-10 MED ORDER — SODIUM CHLORIDE 0.9 % IV SOLN
Freq: Once | INTRAVENOUS | Status: AC
  Administered 2019-03-10: 09:00:00 via INTRAVENOUS

## 2019-03-10 MED ORDER — SODIUM CHLORIDE 0.9 % IV SOLN
180.0000 mg/m2 | Freq: Once | INTRAVENOUS | Status: AC
  Administered 2019-03-10: 360 mg via INTRAVENOUS
  Filled 2019-03-10: qty 15

## 2019-03-10 MED ORDER — SODIUM CHLORIDE 0.9 % IV SOLN
Freq: Once | INTRAVENOUS | Status: AC
  Administered 2019-03-10: 10:00:00 via INTRAVENOUS
  Filled 2019-03-10: qty 5

## 2019-03-10 MED ORDER — PALONOSETRON HCL INJECTION 0.25 MG/5ML
0.2500 mg | Freq: Once | INTRAVENOUS | Status: AC
  Administered 2019-03-10: 0.25 mg via INTRAVENOUS
  Filled 2019-03-10: qty 5

## 2019-03-10 MED ORDER — SODIUM CHLORIDE 0.9 % IV SOLN
5.0000 mg/kg | Freq: Once | INTRAVENOUS | Status: AC
  Administered 2019-03-10: 450 mg via INTRAVENOUS
  Filled 2019-03-10: qty 16

## 2019-03-10 NOTE — Progress Notes (Signed)
Pt presents today for Folfiri / Avastin. VSS. Labs within parameters. Message received to proceed with treatment.   Treatment given today per MD orders. Tolerated infusion without adverse affects. Vital signs stable. No complaints at this time. Discharged from clinic ambulatory. 5 FU pump infusing per protocol. F/U with [facility] as scheduled.

## 2019-03-10 NOTE — Progress Notes (Unsigned)
Referral sent to [facility] Dr Crisoforo [place].  I spoke with office and they will contact pt for appt . Records faxed on 4/21

## 2019-03-10 NOTE — Progress Notes (Signed)
[place], [place] [id number]   CLINIC:  Medical Oncology/Hematology  PCP:  Sharilyn Sites, MD [address] [place] [address] [phone]   REASON FOR VISIT:  Follow-up for metastatic sigmoid colon cancer to the liver  CURRENT THERAPY:Folfiri and bevacizumab (AVASTIN)      BRIEF ONCOLOGIC HISTORY:    Malignant neoplasm of sigmoid colon ([place])   09/17/2018 Initial Diagnosis    Malignant neoplasm of sigmoid colon ([place])    11/03/2018 -  Chemotherapy    The patient had palonosetron (ALOXI) injection 0.25 mg, 0.25 mg, Intravenous,  Once, 10 of 12 cycles Administration: 0.25 mg (11/03/2018), 0.25 mg (11/18/2018), 0.25 mg (12/01/2018), 0.25 mg (12/15/2018), 0.25 mg (12/30/2018), 0.25 mg (01/13/2019), 0.25 mg (01/27/2019), 0.25 mg (02/10/2019), 0.25 mg (02/24/2019), 0.25 mg (03/10/2019) bevacizumab (AVASTIN) 450 mg in sodium chloride 0.9 % 100 mL chemo infusion, 5 mg/kg = 450 mg, Intravenous,  Once, 10 of 12 cycles Administration: 450 mg (11/03/2018), 450 mg (11/18/2018), 450 mg (12/01/2018), 450 mg (12/15/2018), 450 mg (01/13/2019), 450 mg (01/27/2019), 450 mg (02/10/2019), 450 mg (02/24/2019), 450 mg (03/10/2019) irinotecan (CAMPTOSAR) 360 mg in sodium chloride 0.9 % 500 mL chemo infusion, 180 mg/m2 = 360 mg, Intravenous,  Once, 10 of 12 cycles Administration: 360 mg (11/03/2018), 360 mg (11/18/2018), 360 mg (12/01/2018), 360 mg (12/15/2018), 360 mg (12/30/2018), 360 mg (01/13/2019), 360 mg (01/27/2019), 360 mg (02/10/2019), 360 mg (02/24/2019), 360 mg (03/10/2019) leucovorin 800 mg in sodium chloride 0.9 % 250 mL infusion, 808 mg, Intravenous,  Once, 10 of 12 cycles Administration: 800 mg (11/03/2018), 800 mg (11/18/2018), 800 mg (12/01/2018), 800 mg (12/15/2018), 800 mg (12/30/2018), 800 mg (01/13/2019), 800 mg (01/27/2019), 800 mg (02/10/2019), 800 mg (02/24/2019), 800 mg (03/10/2019) fosaprepitant (EMEND) 150 mg in sodium chloride 0.9 % 145 mL IVPB, 150 mg,  Intravenous,  Once, 1 of 1 cycle Administration: 150 mg (12/01/2018) fluorouracil (ADRUCIL) chemo injection 800 mg, 400 mg/m2 = 800 mg, Intravenous,  Once, 10 of 12 cycles Administration: 800 mg (11/03/2018), 800 mg (11/18/2018), 800 mg (12/01/2018), 800 mg (12/15/2018), 800 mg (12/30/2018), 800 mg (01/13/2019), 800 mg (01/27/2019), 800 mg (02/10/2019), 800 mg (02/24/2019), 800 mg (03/10/2019) fosaprepitant (EMEND) 150 mg, dexamethasone (DECADRON) 10 mg in sodium chloride 0.9 % 145 mL IVPB, , Intravenous,  Once, 7 of 9 cycles Administration:  (12/15/2018),  (12/30/2018),  (01/13/2019),  (01/27/2019),  (02/10/2019),  (02/24/2019),  (03/10/2019) fluorouracil (ADRUCIL) 4,850 mg in sodium chloride 0.9 % 53 mL chemo infusion, 2,400 mg/m2 = 4,850 mg, Intravenous, 1 Day/Dose, 10 of 12 cycles Administration: 4,850 mg (11/03/2018), 4,850 mg (11/18/2018), 4,850 mg (12/01/2018), 4,850 mg (12/15/2018), 4,850 mg (12/30/2018), 4,850 mg (01/13/2019), 4,850 mg (01/27/2019), 4,850 mg (02/10/2019), 5,000 mg (02/24/2019), 5,000 mg (03/10/2019)  for chemotherapy treatment.       CANCER STAGING: Cancer Staging No matching staging information was found for the patient.   INTERVAL HISTORY:  Ian [name] 63 y.o. male returns for routine follow-up and consideration for next cycle of chemotherapy. He is here today alone. He stats that he experienced diarrhea after his last treatment. Denies any vomiting. Denies any new pains. Had not noticed any recent bleeding such as epistaxis, hematuria or hematochezia. Denies recent chest pain on exertion, shortness of breath on minimal exertion, pre-syncopal episodes, or palpitations. Denies any numbness or tingling in hands or feet. Denies any recent fevers, infections, or recent hospitalizations. Patient reports appetite at 100% and energy level at 50%.      REVIEW OF SYSTEMS:  Review of Systems  Gastrointestinal: Positive for diarrhea.     PAST MEDICAL/SURGICAL HISTORY:  Past Medical History:   Diagnosis Date  . 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 Colon cancer ([place])   . Complication of anesthesia    atypical pseudo-cholinesterase deficiency  . Diabetes mellitus without complication ([place])   . Family history of adverse reaction to anesthesia    sister also has atypical pseudo-cholinesterase deficiency  . GERD (gastroesophageal reflux disease)   . History of heart bypass surgery   . Hypertension    Past Surgical History:  Procedure Laterality Date  . COLONOSCOPY N/A 08/29/2018   Procedure: COLONOSCOPY;  Surgeon: Daneil Dolin, MD;  Location: [facility];  Service: Endoscopy;  Laterality: N/A;  9:30  . CORONARY ARTERY BYPASS GRAFT     4 vessels  . heart bypass    . INSERTION OF MESH  02/07/2015   Procedure: INSERTION OF MESH;  Surgeon: Aviva Signs Md, MD;  Location: [facility] ORS;  Service: General;;  . PARTIAL COLECTOMY N/A 09/17/2018   Procedure: PARTIAL COLECTOMY WITH PARTIAL WEDGE RESECTION LIVER METASTASIS;  Surgeon: Aviva Signs, MD;  Location: [facility] ORS;  Service: General;  Laterality: N/A;  . POLYPECTOMY  08/29/2018   Procedure: POLYPECTOMY;  Surgeon: Daneil Dolin, MD;  Location: [facility];  Service: Endoscopy;;  . PORTACATH PLACEMENT Left 10/10/2018   Procedure: INSERTION PORT-A-CATH (catheter in left subclavian);  Surgeon: Aviva Signs, MD;  Location: [facility] ORS;  Service: General;  Laterality: Left;  . UMBILICAL HERNIA REPAIR N/A 02/07/2015   Procedure: UMBILICAL HERNIORRHAPHY WITH MESH;  Surgeon: Aviva Signs Md, MD;  Location: [facility] ORS;  Service: General;  Laterality: N/A;     SOCIAL HISTORY:  Social History   Socioeconomic History  . Marital status: Married    Spouse name: Not on file  . Number of children: 3  . Years of education: Not on file  . Highest education level: Not on file  Occupational History  . 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 Financial resource strain: Not hard at all  . Food insecurity:     Worry: Never true    Inability: Never true  . Transportation needs:    Medical: No    Non-medical: No  Tobacco Use  . Smoking status: Never Smoker  . Smokeless tobacco: Never Used  Substance and Sexual Activity  . Alcohol use: No  . Drug use: No  . Sexual activity: Yes    Birth control/protection: None  Lifestyle  . Physical activity:    Days per week: 0 days    Minutes per session: 0 min  . Stress: Not at all  Relationships  . Social connections:    Talks on phone: More than three times a week    Gets together: More than three times a week    Attends religious service: Never    Active member of club or organization: No    Attends meetings of clubs or organizations: Never    Relationship status: Married  . Intimate partner violence:    Fear of current or ex partner: No    Emotionally abused: No    Physically abused: No    Forced sexual activity: No  Other Topics Concern  . Not on file  Social History Narrative  . Not on file    FAMILY HISTORY:  Family History  Problem Relation Age of Onset  . Hypertension Father   . Diabetes Father   .  Heart disease Father   . Diabetes Sister   . Hypertension Sister   . Hypertension Brother   . Diabetes Brother   . Diabetes Sister   . Diabetes Brother   . Heart disease Brother   . Lymphoma Brother   . Hypertension Son     CURRENT MEDICATIONS:  Outpatient Encounter Medications as of 03/10/2019  Medication Sig Note  . amLODipine (NORVASC) 10 MG tablet Take 1 tablet (10 mg total) by mouth daily.   Marland Kitchen aspirin EC 81 MG tablet Take 81 mg by mouth daily.   . B-D UF III MINI PEN NEEDLES 31G X 5 MM MISC    . Bevacizumab (AVASTIN IV) Inject 450 mg into the vein every 14 (fourteen) days.   . Coenzyme Q10 (COQ10) 100 MG CAPS Take 100 mg by mouth daily.   Marland Kitchen diltiazem (CARDIZEM CD) 240 MG 24 hr capsule Take 240 mg by mouth daily.   . diphenhydramine-acetaminophen (TYLENOL PM) 25-500 MG TABS tablet Take 2 tablets by mouth at bedtime.    Marland Kitchen ezetimibe-simvastatin (VYTORIN) 10-80 MG per tablet Take 1 tablet by mouth at bedtime.   Marland Kitchen FARXIGA 10 MG TABS tablet    . GAVILYTE-N WITH FLAVOR PACK 420 g solution    . HUMALOG MIX 75/25 KWIKPEN (75-25) 100 UNIT/ML Kwikpen    . hydrochlorothiazide (MICROZIDE) 12.5 MG capsule Take 12.5 mg by mouth daily.   . Insulin Glargine (BASAGLAR KWIKPEN) 100 UNIT/ML SOPN    . insulin glargine (LANTUS SOLOSTAR) 100 UNIT/ML injection Inject 20 Units into the skin at bedtime.  10/10/2018: Took 10 units  . insulin lispro protamine-lispro (HUMALOG 75/25 MIX) (75-25) 100 UNIT/ML SUSP injection Inject 16 Units into the skin 3 (three) times daily. 10/10/2018: 1/2 dose  . IRINOTECAN HCL IV Inject 360 mg into the vein every 14 (fourteen) days.   Marland Kitchen JANUVIA 100 MG tablet    . LEUCOVORIN CALCIUM IV Inject 808 mg into the vein every 14 (fourteen) days.   Marland Kitchen lidocaine-prilocaine (EMLA) cream Apply small amount over port site one hour prior to appointment. Cover with plastic wrap.   . loperamide (IMODIUM A-D) 2 MG tablet Take 2 capsules after first loose stool and 1 capsule after each loose stool thereafter.   . metFORMIN (GLUCOPHAGE) 1000 MG tablet Take 1,000 mg by mouth 2 (two) times daily.    . metoprolol succinate (TOPROL-XL) 50 MG 24 hr tablet Take 75 mg by mouth 2 (two) times daily.   . niacin (NIASPAN) 500 MG CR tablet Take 500 mg by mouth at bedtime.   Marland Kitchen NOVOLOG MIX 70/30 FLEXPEN (70-30) 100 UNIT/ML FlexPen    . Omega-3 Fatty Acids (FISH OIL PO) Take 1 capsule by mouth daily.   . pantoprazole (PROTONIX) 40 MG tablet Take 40 mg by mouth daily.   . prochlorperazine (COMPAZINE) 10 MG tablet Take 1 tablet (10 mg total) by mouth every 6 (six) hours as needed (NAUSEA).   . ramipril (ALTACE) 10 MG capsule Take 10 mg by mouth daily.   . sodium chloride 0.9 % SOLN with fluorouracil 5 GM/100ML SOLN 200 mg/m2/day Inject 5,650 mg into the vein. Over 46 hours    No facility-administered encounter medications on file as of  03/10/2019.     ALLERGIES:  Allergies  Allergen Reactions  . Anectine [Succinylcholine] Other (See Comments)    Atypical pseudocholinesterase deficiency.      PHYSICAL EXAM:  ECOG Performance status: 1  Vitals:   03/10/19 0816  BP: (!) 157/70  Pulse: 89  Resp: 18  Temp: 98.4 F (36.9 C)  SpO2: 98%   Filed Weights   03/10/19 0816  Weight: 208 lb 8 oz (94.6 kg)    Physical Exam Vitals signs reviewed.  Constitutional:      Appearance: Normal appearance.  Cardiovascular:     Rate and Rhythm: Normal rate and regular rhythm.     Heart sounds: Normal heart sounds.  Pulmonary:     Effort: Pulmonary effort is normal.     Breath sounds: Normal breath sounds.  Abdominal:     General: There is no distension.     Palpations: Abdomen is soft. There is no mass.  Musculoskeletal:        General: No swelling.  Skin:    General: Skin is warm.  Neurological:     General: No focal deficit present.     Mental Status: He is alert and oriented to person, place, and time.  Psychiatric:        Mood and Affect: Mood normal.        Behavior: Behavior normal.      LABORATORY DATA:  I have reviewed the labs as listed.  CBC    Component Value Date/Time   WBC 7.9 03/10/2019 0756   RBC 3.96 (L) 03/10/2019 0756   HGB 12.4 (L) 03/10/2019 0756   HCT 37.8 (L) 03/10/2019 0756   PLT 195 03/10/2019 0756   MCV 95.5 03/10/2019 0756   MCH 31.3 03/10/2019 0756   MCHC 32.8 03/10/2019 0756   RDW 17.4 (H) 03/10/2019 0756   LYMPHSABS 2.6 03/10/2019 0756   MONOABS 0.9 03/10/2019 0756   EOSABS 0.1 03/10/2019 0756   BASOSABS 0.1 03/10/2019 0756   CMP Latest Ref Rng & Units 03/10/2019 02/24/2019 02/10/2019  Glucose 70 - 99 mg/dL 227(H) 152(H) 224(H)  BUN 8 - 23 mg/dL '22 18 17  ' Creatinine 0.61 - 1.24 mg/dL 1.04 1.07 1.03  Sodium 135 - 145 mmol/L 137 140 139  Potassium 3.5 - 5.1 mmol/L 4.1 3.6 3.7  Chloride 98 - 111 mmol/L 104 105 105  CO2 22 - 32 mmol/L 22 22 21(L)  Calcium 8.9 - 10.3 mg/dL  9.3 9.8 9.6  Total Protein 6.5 - 8.1 g/dL 7.2 7.5 7.4  Total Bilirubin 0.3 - 1.2 mg/dL 0.4 0.4 0.5  Alkaline Phos 38 - 126 U/L 78 85 83  AST 15 - 41 U/L '28 30 23  ' ALT 0 - 44 U/L 46(H) 36 31       DIAGNOSTIC IMAGING:  I have independently reviewed the scans and discussed with the patient.   I have reviewed Venita Lick LPN's note and agree with the documentation.  I personally performed a face-to-face visit, made revisions and my assessment and plan is as follows.    ASSESSMENT & PLAN:   Malignant neoplasm of sigmoid colon ([place]) 1.  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  Pathology was consistent with invasive moderately differentiated adenocarcinoma arising in a tubular adenoma with tumor invading the submucosa with no lymphovascular invasion.  Cauterized resection margin is positive for tumor. -Preoperative CEA on 09/12/2018 was 46.5. - Sigmoid colon segmental resection on 09/17/2018 showing no residual carcinoma in the sigmoid colon, margins uninvolved, 3 tumor deposits present and 1/12 lymph nodes positive.  Liver biopsy was consistent with metastatic carcinoma. -  He never had any history of bleeding per rectum or melena.  No family history of colorectal cancer.  Brother died of lymphoma. - MRI of the liver on 10/21/2018 shows complex but nonenhancing lesion at the prior wedge biopsy site measuring 3.1 x 2.3 cm, with a complex cystic cavity with fluid fluid level. - CT CAP shows small nodule in the right upper lobe of the lung measuring 5 mm.  Small amount of soft tissue at the anastomotic site likely postsurgical in etiology. - PET CT scan on 10/30/2018 did not show any distant metastatic disease other than in the liver.   - 6 cycles of FOLFIRI and bevacizumab from  11/03/2018 through 01/13/2019. -Last CEA on 12/15/2018 was 2. - CT of the chest on 01/23/2019 showed 2-4 mm pulmonary nodule in the right lung, stable from prior study.  No other abnormalities were seen related to malignancy. -MRI of the liver on 01/20/2019 shows decrease in size of the subcapsular liver metastasis, measuring 2 x 1.9 cm, previously 2.9 x 2.8 cm.  No new areas were seen. - Cycle 9 on 02/24/2019. -He tolerated last cycle very well.  He had diarrhea for couple of days.  Denies any bleeding issues.  He had nausea but denied any vomiting. - I have reviewed his blood work.  We will proceed with his cycle 10 today.  I will make a referral to Dr.Shen at [facility] for resection of liver lesion.  2.  Peripheral neuropathy: -He has grade 1 neuropathy in the feet from longstanding diabetes.  3.  Hypertension: -Blood pressure is 157/70.  He will continue Altace and HCTZ.  He will also continue Norvasc.  Total time spent is 25 minutes with more than 50% of the time spent face-to-face discussing treatment related side effects and coordination of care.     Orders placed this encounter:  No orders of the defined types were placed in this encounter.     Derek Jack, MD [place] [phone]

## 2019-03-10 NOTE — Patient Instructions (Addendum)
[facility] at [facility] Discharge Instructions  You were seen today by Dr. Katragadda. He went over your recent lab results. He will see you back in 2 weeks for labs and follow up.   Thank you for choosing [facility] at [facility] to provide your oncology and hematology care.  To afford each patient quality time with our provider, please arrive at least 15 minutes before your scheduled appointment time.   If you have a lab appointment with the Cancer Center please come in thru the  Main Entrance and check in at the main information desk  You need to re-schedule your appointment should you arrive 10 or more minutes late.  We strive to give you quality time with our providers, and arriving late affects you and other patients whose appointments are after yours.  Also, if you no show three or more times for appointments you may be dismissed from the clinic at the providers discretion.     Again, thank you for choosing [facility].  Our hope is that these requests will decrease the amount of time that you wait before being seen by our physicians.       _____________________________________________________________  Should you have questions after your visit to [facility], please contact our office at [phone] between the hours of 8:00 a.m. and 4:30 p.m.  Voicemails left after 4:00 p.m. will not be returned until the following business day.  For prescription refill requests, have your pharmacy contact our office and allow 72 hours.    Cancer Center Support Programs:   > Cancer Support Group  2nd Tuesday of the month 1pm-2pm, Journey Room    

## 2019-03-10 NOTE — Patient Instructions (Signed)
[facility] Discharge Instructions for Patients Receiving Chemotherapy  Today you received the following chemotherapy agents   To help prevent nausea and vomiting after your treatment, we encourage you to take your nausea medication   If you develop nausea and vomiting that is not controlled by your nausea medication, call the clinic.   BELOW ARE SYMPTOMS THAT SHOULD BE REPORTED IMMEDIATELY:  *FEVER GREATER THAN 100.5 F  *CHILLS WITH OR WITHOUT FEVER  NAUSEA AND VOMITING THAT IS NOT CONTROLLED WITH YOUR NAUSEA MEDICATION  *UNUSUAL SHORTNESS OF BREATH  *UNUSUAL BRUISING OR BLEEDING  TENDERNESS IN MOUTH AND THROAT WITH OR WITHOUT PRESENCE OF ULCERS  *URINARY PROBLEMS  *BOWEL PROBLEMS  UNUSUAL RASH Items with * indicate a potential emergency and should be followed up as soon as possible.  Feel free to call the clinic should you have any questions or concerns. The clinic phone number is [phone].  Please show the CHEMO ALERT CARD at check-in to the Emergency Department and triage nurse.   

## 2019-03-10 NOTE — Assessment & Plan Note (Signed)
1.  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  Pathology was consistent with invasive moderately differentiated adenocarcinoma arising in a tubular adenoma with tumor invading the submucosa with no lymphovascular invasion.  Cauterized resection margin is positive for tumor. -Preoperative CEA on 09/12/2018 was 46.5. - Sigmoid colon segmental resection on 09/17/2018 showing no residual carcinoma in the sigmoid colon, margins uninvolved, 3 tumor deposits present and 1/12 lymph nodes positive.  Liver biopsy was consistent with metastatic carcinoma. - He never had any history of bleeding per rectum or melena.  No family history of colorectal cancer.  Brother died of lymphoma. - MRI of the liver on 10/21/2018 shows complex but nonenhancing lesion at the prior wedge biopsy site measuring 3.1 x 2.3 cm, with a complex cystic cavity with fluid fluid level. - CT CAP shows small nodule in the right upper lobe of the lung measuring 5 mm.  Small amount of soft tissue at the anastomotic site likely postsurgical in etiology. - PET CT scan on 10/30/2018 did not show any distant metastatic disease other than in the liver.   - 6 cycles of FOLFIRI and bevacizumab from 11/03/2018 through 01/13/2019. -Last CEA on 12/15/2018 was 2. - CT of the chest on 01/23/2019 showed 2-4 mm pulmonary nodule in the right lung, stable from prior study.  No other abnormalities were seen related to malignancy. -MRI of the liver on 01/20/2019 shows decrease in size of the subcapsular liver metastasis, measuring 2 x 1.9 cm, previously 2.9 x 2.8 cm.  No new areas were seen. - Cycle 9 on 02/24/2019. -He tolerated last cycle very well.  He had diarrhea for couple of days.  Denies any bleeding issues.  He had nausea but denied any vomiting. -  I have reviewed his blood work.  We will proceed with his cycle 10 today.  I will make a referral to Dr[facility]Shen at [facility] for resection of liver lesion.  2.  Peripheral neuropathy: -He has grade 1 neuropathy in the feet from longstanding diabetes.  3.  Hypertension: -Blood pressure is 157/70.  He will continue Altace and HCTZ.  He will also continue Norvasc.

## 2019-03-11 ENCOUNTER — Other Ambulatory Visit: Payer: Self-pay

## 2019-03-11 MED ORDER — LANREOTIDE ACETATE 120 MG/0.5ML ~~LOC~~ SOLN
SUBCUTANEOUS | Status: AC
  Filled 2019-03-11: qty 120

## 2019-03-12 ENCOUNTER — Inpatient Hospital Stay (HOSPITAL_COMMUNITY): Payer: 59

## 2019-03-12 VITALS — BP 143/70 | HR 81 | Temp 97.7°F | Resp 18

## 2019-03-12 DIAGNOSIS — Z5112 Encounter for antineoplastic immunotherapy: Secondary | ICD-10-CM | POA: Diagnosis not present

## 2019-03-12 DIAGNOSIS — R160 Hepatomegaly, not elsewhere classified: Secondary | ICD-10-CM

## 2019-03-12 DIAGNOSIS — C187 Malignant neoplasm of sigmoid colon: Secondary | ICD-10-CM

## 2019-03-12 MED ORDER — SODIUM CHLORIDE 0.9% FLUSH
10.0000 mL | INTRAVENOUS | Status: DC | PRN
  Administered 2019-03-12: 14:00:00 10 mL
  Filled 2019-03-12: qty 10

## 2019-03-12 MED ORDER — HEPARIN SOD (PORK) LOCK FLUSH 100 UNIT/ML IV SOLN
500.0000 [IU] | Freq: Once | INTRAVENOUS | Status: AC | PRN
  Administered 2019-03-12: 14:00:00 500 [IU]

## 2019-03-12 NOTE — Progress Notes (Signed)
Row[place] Robert tolerated 5FU pump well without complaints or incident. 5FU pump discontinued with portacath flushed with 10 ml NS and 5 ml Heparin easily per protocol then de-accessed. VSS Pt discharged self ambulatory in satisfactory condition

## 2019-03-12 NOTE — Patient Instructions (Signed)
[place] at [facility] Discharge Instructions  5FU pump discontinued today with portacath flushed per protocol. Follow-up as scheduled. Call clinic for any questions or concerns   Thank you for choosing [place] at [facility] to provide your oncology and hematology care.  To afford each patient quality time with our provider, please arrive at least 15 minutes before your scheduled appointment time.   If you have a lab appointment with the [facility] please come in thru the  Main Entrance and check in at the main information desk  You need to re-schedule your appointment should you arrive 10 or more minutes late.  We strive to give you quality time with our providers, and arriving late affects you and other patients whose appointments are after yours.  Also, if you no show three or more times for appointments you may be dismissed from the clinic at the providers discretion.     Again, thank you for choosing [facility].  Our hope is that these requests will decrease the amount of time that you wait before being seen by our physicians.       _____________________________________________________________  Should you have questions after your visit to [facility], please contact our office at [phone] between the hours of 8:00 a.m. and 4:30 p.m.  Voicemails left after 4:00 p.m. will not be returned until the following business day.  For prescription refill requests, have your pharmacy contact our office and allow 72 hours.    Cancer Center Support Programs:   > Cancer Support Group  2nd Tuesday of the month 1pm-2pm, Journey Room

## 2019-03-13 MED ORDER — FULVESTRANT 250 MG/5ML IM SOLN
INTRAMUSCULAR | Status: AC
  Filled 2019-03-13: qty 5

## 2019-03-14 IMAGING — CT CT CHEST W/ CM
2 of 5 series · 12 of 36 positions shown, 15 images · IV contrast (Isovue)
Comparison: Chest radiograph 10/10/2018; CT abdomen pelvis
03/21/2007

CLINICAL DATA: Patient with recent diagnosis of colon cancer status
post partial colectomy and partial hepatic resection.

EXAM:
CT CHEST, ABDOMEN, AND PELVIS WITH CONTRAST
TECHNIQUE: Multidetector CT imaging of the chest, abdomen and pelvis was
performed following the standard protocol during bolus
administration of intravenous contrast.
CONTRAST:  100mL 7NDDW0-3FF IOPAMIDOL (7NDDW0-3FF) INJECTION 61%

[Series 2: cap with · axial · 0.77mm/px · z∈[-630,-115]mm · 9 of 127 slices shown, 12 images]
[im 12/127  mediastinal]
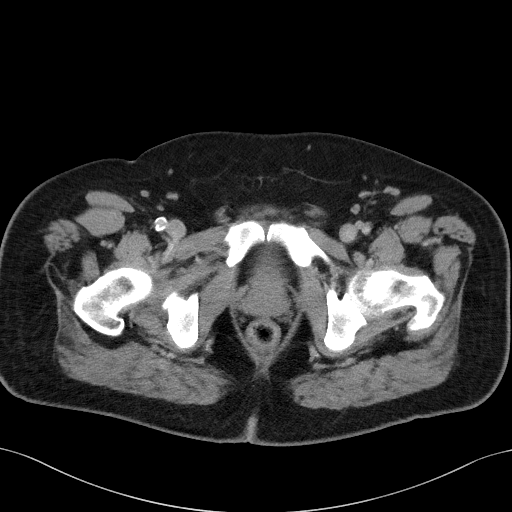
[im 12/127  lung]
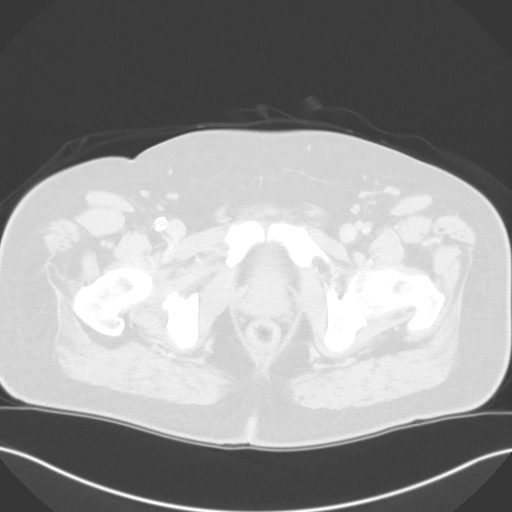
[im 23/127  lung]
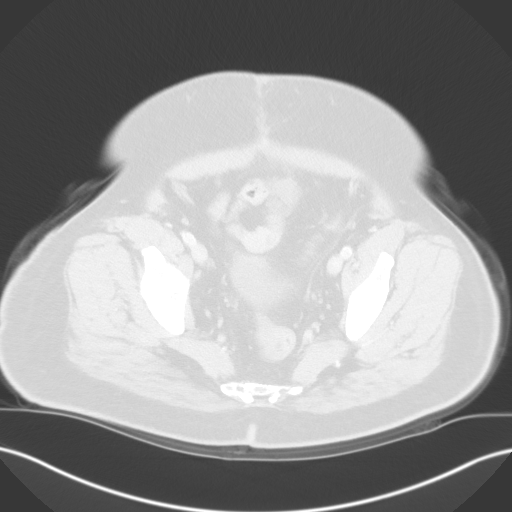
[im 35/127  lung]
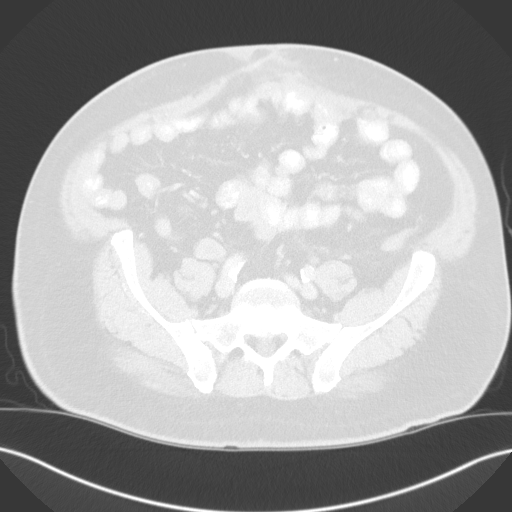
[im 46/127  lung]
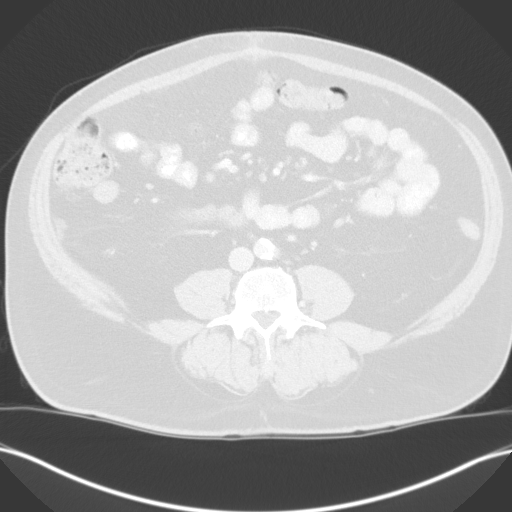
[im 69/127  mediastinal]
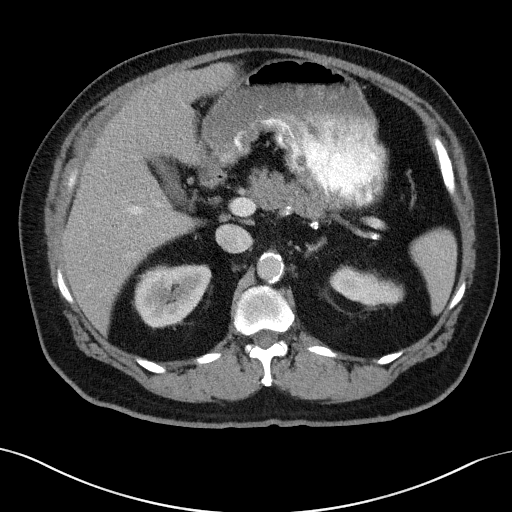
[im 69/127  lung]
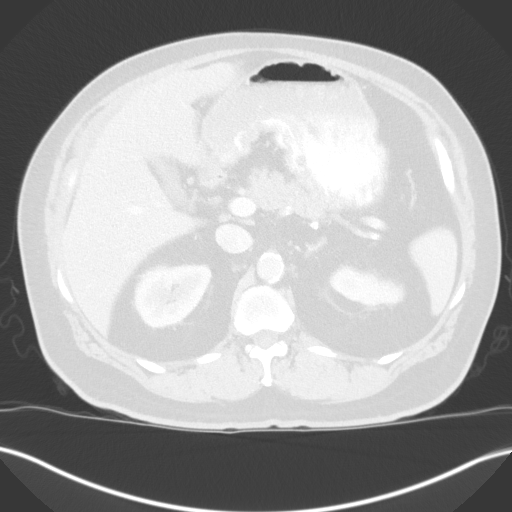
[im 81/127  lung]
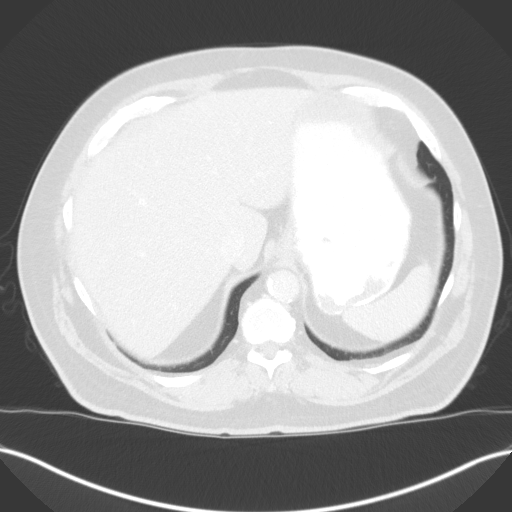
[im 92/127  lung]
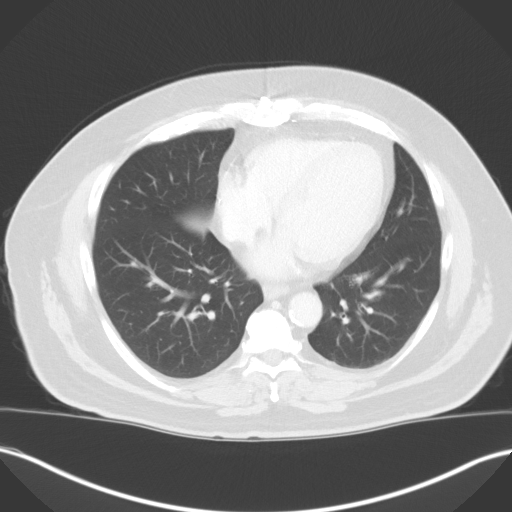
[im 104/127  lung]
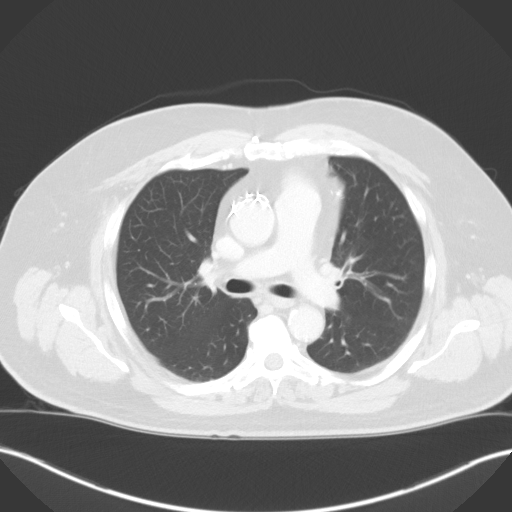
[im 115/127  mediastinal]
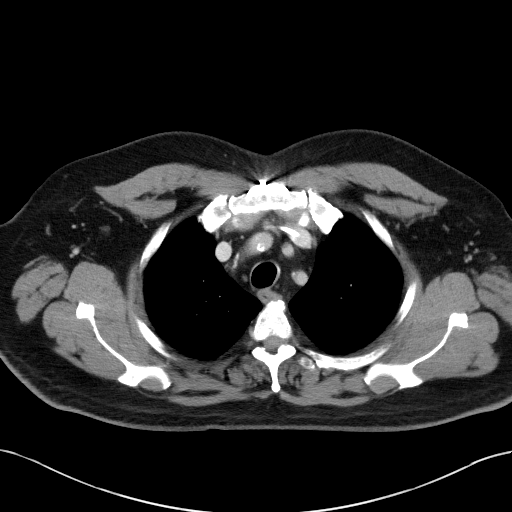
[im 115/127  lung]
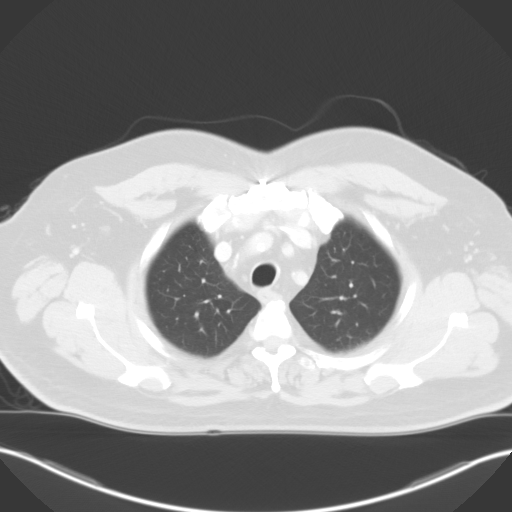

[Series 4: coronals · coronal · 0.72mm/px · 3 of 147 slices shown]
[im 30/147  lung]
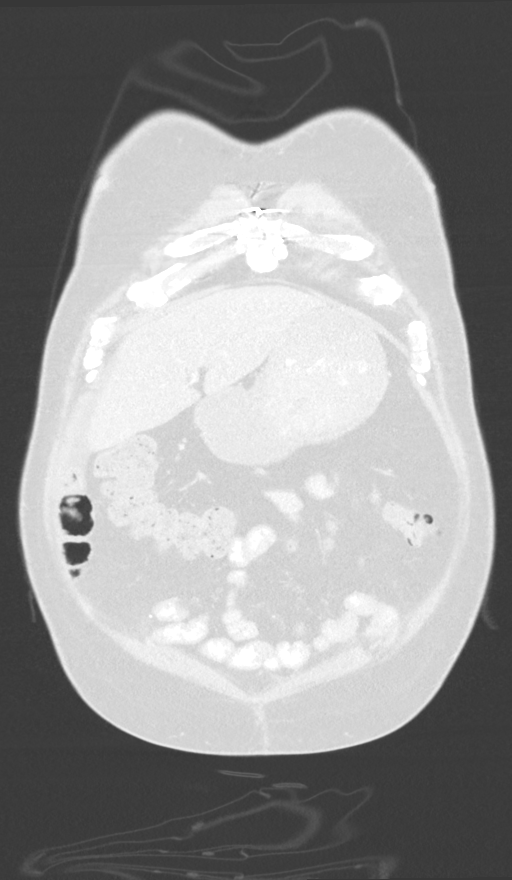
[im 59/147  lung]
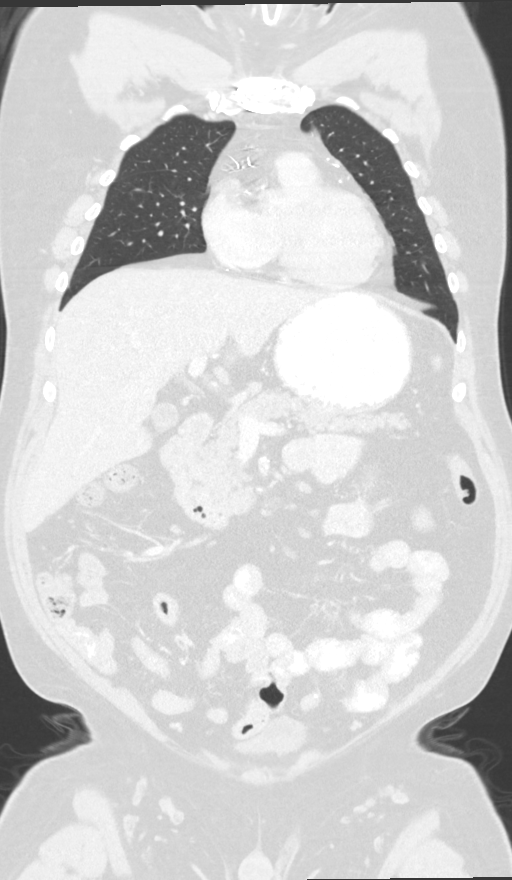
[im 88/147  lung]
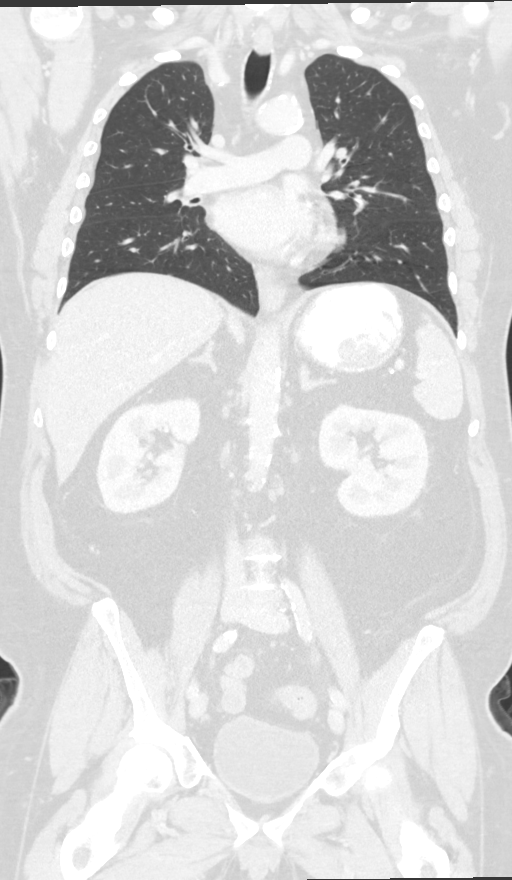

[12 of 36 positions shown; findings below may reference images not displayed]

FINDINGS: CT CHEST FINDINGS

Cardiovascular: Left anterior chest wall Port-A-Cath is present with
tip terminating in the superior vena cava. Normal heart size.
Thoracic aortic and coronary arterial vascular calcifications.

Mediastinum/Nodes: No enlarged axillary, mediastinal or hilar
lymphadenopathy. Normal appearance of the esophagus. 3.7 x 3.2 cm
heterogeneous nodule within the right thyroid (image 5; series 2).

Lungs/Pleura: Central airways are patent. Dependent atelectasis
within the bilateral lower lobes. 5 mm right upper lobe nodule along
the fissure (image 61; series 3). No additional discrete pulmonary
nodule identified. No large area pulmonary consolidation. No pleural
effusion or pneumothorax.

Musculoskeletal: Thoracic spine degenerative changes. Patient status
post median sternotomy. Patchy lucency within the T4 and T7
vertebral bodies, favored represent hemangiomas.

CT ABDOMEN PELVIS FINDINGS

Hepatobiliary: Liver is normal in size. In the anterior right
hepatic lobe there is a 3.0 x 2.6 cm subcapsular region of low
attenuation (image 55; series 2). No additional hepatic lesions are
identified. Small amount of perihepatic fluid.

Pancreas: Unremarkable

Spleen: Unremarkable

Adrenals/Urinary Tract: Normal adrenal glands. Kidneys enhance
symmetrically with contrast. No hydronephrosis. Urinary bladder is
unremarkable. Subcentimeter too small to characterize
low-attenuation lesion interpolar region right kidney.

Stomach/Bowel: Postsurgical changes compatible with sigmoid
colectomy. Small amount of soft tissue at the anastomotic site
(image 102; series 2), potentially postsurgical in etiology. Normal
appendix. Normal morphology of the stomach. No evidence for bowel
obstruction. No free fluid or free intraperitoneal air.

Vascular/Lymphatic: Normal caliber abdominal aorta. Peripheral
calcified atherosclerotic plaque. No retroperitoneal
lymphadenopathy.

Reproductive: Prostate unremarkable.

Other: Small amount of fat stranding within the anterior abdomen
(image 88; series 2). Postsurgical changes anterior abdominal wall.

Musculoskeletal: Lumbar spine degenerative changes. No aggressive or
acute appearing osseous lesions.
IMPRESSION: 1. Patient status post sigmoid colectomy. Small amount of soft
tissue at the anastomotic site likely postsurgical in etiology.
Recommend attention on follow-up.
2. There is a 3.0 cm subcapsular low-attenuation lesion within the
right hepatic lobe which likely corresponds with reported wedge
resection of liver mass. Recommend attention on follow-up.
3. Small nodule right upper lobe. This may represent a small lymph
node or potentially small nodule from an infectious/inflammatory
process. Recommend attention on follow-up.
4. Heterogeneous nodule within the right lobe of the thyroid.
Recommend further evaluation with thyroid ultrasound if not
previously performed.

## 2019-03-23 ENCOUNTER — Other Ambulatory Visit (HOSPITAL_COMMUNITY): Payer: Self-pay | Admitting: Nurse Practitioner

## 2019-03-23 DIAGNOSIS — I10 Essential (primary) hypertension: Secondary | ICD-10-CM

## 2019-03-24 ENCOUNTER — Ambulatory Visit (HOSPITAL_COMMUNITY): Payer: Self-pay

## 2019-03-24 ENCOUNTER — Telehealth (HOSPITAL_COMMUNITY): Payer: Self-pay

## 2019-03-24 ENCOUNTER — Ambulatory Visit (HOSPITAL_COMMUNITY): Payer: Self-pay | Admitting: Hematology

## 2019-03-24 ENCOUNTER — Other Ambulatory Visit (HOSPITAL_COMMUNITY): Payer: 59

## 2019-03-24 NOTE — Telephone Encounter (Signed)
Called patient today to see why he had not showed for his appointment today. He is having surgery with Dr. Ok Edwards on June the 4th, will not be back for treatment till after that. Will let MD know.

## 2019-03-26 ENCOUNTER — Encounter (HOSPITAL_COMMUNITY): Payer: Self-pay

## 2019-05-07 ENCOUNTER — Encounter (HOSPITAL_COMMUNITY): Payer: Self-pay | Admitting: Hematology

## 2019-05-07 ENCOUNTER — Other Ambulatory Visit: Payer: Self-pay

## 2019-05-07 ENCOUNTER — Inpatient Hospital Stay (HOSPITAL_COMMUNITY): Payer: 59 | Attending: Hematology | Admitting: Hematology

## 2019-05-07 VITALS — BP 153/82 | HR 91 | Temp 98.6°F | Resp 16 | Wt 209.5 lb

## 2019-05-07 DIAGNOSIS — C787 Secondary malignant neoplasm of liver and intrahepatic bile duct: Secondary | ICD-10-CM | POA: Insufficient documentation

## 2019-05-07 DIAGNOSIS — E1142 Type 2 diabetes mellitus with diabetic polyneuropathy: Secondary | ICD-10-CM | POA: Insufficient documentation

## 2019-05-07 DIAGNOSIS — C187 Malignant neoplasm of sigmoid colon: Secondary | ICD-10-CM

## 2019-05-07 DIAGNOSIS — I10 Essential (primary) hypertension: Secondary | ICD-10-CM | POA: Insufficient documentation

## 2019-05-07 NOTE — Assessment & Plan Note (Signed)
1.  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  Pathology was consistent with invasive moderately differentiated adenocarcinoma arising in a tubular adenoma with tumor invading the submucosa with no lymphovascular invasion.  Cauterized resection margin is positive for tumor. -Preoperative CEA on 09/12/2018 was 46.5. - Sigmoid colon segmental resection on 09/17/2018 showing no residual carcinoma in the sigmoid colon, margins uninvolved, 3 tumor deposits present and 1/12 lymph nodes positive.  Liver biopsy was consistent with metastatic carcinoma (margins not reported). - He never had any history of bleeding per rectum or melena.  No family history of colorectal cancer.  Brother died of lymphoma. - MRI of the liver on 10/21/2018 shows complex but nonenhancing lesion at the prior wedge biopsy site measuring 3.1 x 2.3 cm, with a complex cystic cavity with fluid fluid level. - PET CT scan on 10/30/2018 did not show any distant metastatic disease other than in the liver.   - 10 cycles of FOLFIRI and Avastin from 11/03/2018 through 03/10/2019.  -Last CEA on 12/15/2018 was 2.  - CT of the chest on 01/23/2019 showed 2-4 mm pulmonary nodule in the right lung, stable from prior study.  No other abnormalities were seen related to malignancy. -MRI of the liver on 01/20/2019 shows decrease in size of the subcapsular liver metastasis, measuring 2 x 1.9 cm, previously 2.9 x 2.8 cm.  No new areas were seen. -MRI of the liver on 04/11/2027 [facility] shows decreasing subcapsular segment 4 liver lesion consistent with resolving hematoma.  Dr. Crisoforo Oxford has decided to wait and watch this lesion instead of surgical resection. - He is functioning well and gaining his weight.  His energy levels are coming back to  normal. -I have recommended doing a CT CAP with contrast for surveillance and a CEA level.  I will see him back after the scans.  2.  Peripheral neuropathy: -He has grade 1 neuropathy in the feet from longstanding diabetes which is stable.  3.  Hypertension: -His blood pressure is 153/82.  He will continue Altace and HCTZ.  He will also continue Norvasc.

## 2019-05-07 NOTE — Progress Notes (Signed)
[place], [place] [address]   CLINIC:  Medical Oncology/Hematology  PCP:  Sharilyn Sites, [place] [id number] [phone]   REASON FOR VISIT:  Follow-up for metastatic sigmoid colon cancer to the liver  CURRENT THERAPY:Folfiri and bevacizumab (AVASTIN)      BRIEF ONCOLOGIC HISTORY:  Oncology History  Malignant neoplasm of sigmoid colon ([place])  09/17/2018 Initial Diagnosis   Malignant neoplasm of sigmoid colon ([place])   11/03/2018 -  Chemotherapy   The patient had palonosetron (ALOXI) injection 0.25 mg, 0.25 mg, Intravenous,  Once, 10 of 12 cycles Administration: 0.25 mg (11/03/2018), 0.25 mg (11/18/2018), 0.25 mg (12/01/2018), 0.25 mg (12/15/2018), 0.25 mg (12/30/2018), 0.25 mg (01/13/2019), 0.25 mg (01/27/2019), 0.25 mg (02/10/2019), 0.25 mg (02/24/2019), 0.25 mg (03/10/2019) bevacizumab (AVASTIN) 450 mg in sodium chloride 0.9 % 100 mL chemo infusion, 5 mg/kg = 450 mg, Intravenous,  Once, 10 of 12 cycles Administration: 450 mg (11/03/2018), 450 mg (11/18/2018), 450 mg (12/01/2018), 450 mg (12/15/2018), 450 mg (01/13/2019), 450 mg (01/27/2019), 450 mg (02/10/2019), 450 mg (02/24/2019), 450 mg (03/10/2019) irinotecan (CAMPTOSAR) 360 mg in sodium chloride 0.9 % 500 mL chemo infusion, 180 mg/m2 = 360 mg, Intravenous,  Once, 10 of 12 cycles Administration: 360 mg (11/03/2018), 360 mg (11/18/2018), 360 mg (12/01/2018), 360 mg (12/15/2018), 360 mg (12/30/2018), 360 mg (01/13/2019), 360 mg (01/27/2019), 360 mg (02/10/2019), 360 mg (02/24/2019), 360 mg (03/10/2019) leucovorin 800 mg in sodium chloride 0.9 % 250 mL infusion, 808 mg, Intravenous,  Once, 10 of 12 cycles Administration: 800 mg (11/03/2018), 800 mg (11/18/2018), 800 mg (12/01/2018), 800 mg (12/15/2018), 800 mg (12/30/2018), 800 mg (01/13/2019), 800 mg (01/27/2019), 800 mg (02/10/2019), 800 mg (02/24/2019), 800 mg (03/10/2019) fosaprepitant (EMEND) 150 mg in sodium chloride 0.9 % 145 mL IVPB, 150  mg, Intravenous,  Once, 1 of 1 cycle Administration: 150 mg (12/01/2018) fluorouracil (ADRUCIL) chemo injection 800 mg, 400 mg/m2 = 800 mg, Intravenous,  Once, 10 of 12 cycles Administration: 800 mg (11/03/2018), 800 mg (11/18/2018), 800 mg (12/01/2018), 800 mg (12/15/2018), 800 mg (12/30/2018), 800 mg (01/13/2019), 800 mg (01/27/2019), 800 mg (02/10/2019), 800 mg (02/24/2019), 800 mg (03/10/2019) fosaprepitant (EMEND) 150 mg, dexamethasone (DECADRON) 10 mg in sodium chloride 0.9 % 145 mL IVPB, , Intravenous,  Once, 7 of 9 cycles Administration:  (12/15/2018),  (12/30/2018),  (01/13/2019),  (01/27/2019),  (02/10/2019),  (02/24/2019),  (03/10/2019) fluorouracil (ADRUCIL) 4,850 mg in sodium chloride 0.9 % 53 mL chemo infusion, 2,400 mg/m2 = 4,850 mg, Intravenous, 1 Day/Dose, 10 of 12 cycles Administration: 4,850 mg (11/03/2018), 4,850 mg (11/18/2018), 4,850 mg (12/01/2018), 4,850 mg (12/15/2018), 4,850 mg (12/30/2018), 4,850 mg (01/13/2019), 4,850 mg (01/27/2019), 4,850 mg (02/10/2019), 5,000 mg (02/24/2019), 5,000 mg (03/10/2019)  for chemotherapy treatment.       CANCER STAGING: Cancer Staging No matching staging information was found for the patient.   INTERVAL HISTORY:  Ian [name] 63 y.o. male returns for follow-up of his metastatic colon cancer.  He was evaluated by Dr. Jyl Heinz at [facility].  He had MRI of the liver done on 04/10/2019 at [facility].  His energy levels are returning back to normal.  Denies any nausea, vomiting, diarrhea or constipation.  Denies any bleeding episodes.  Appetite is 100%.  Energy levels are 100%.  Denies any recent ER visits or hospitalizations.    REVIEW OF SYSTEMS:  Review of Systems  All other systems reviewed and are negative.    PAST MEDICAL/SURGICAL HISTORY:  Past Medical  History:  Diagnosis Date  . 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 Colon cancer ([place])   . Complication of anesthesia    atypical pseudo-cholinesterase deficiency  . Diabetes  mellitus without complication ([place])   . Family history of adverse reaction to anesthesia    sister also has atypical pseudo-cholinesterase deficiency  . GERD (gastroesophageal reflux disease)   . History of heart bypass surgery   . Hypertension    Past Surgical History:  Procedure Laterality Date  . COLONOSCOPY N/A 08/29/2018   Procedure: COLONOSCOPY;  Surgeon: Daneil Dolin, MD;  Location: [facility] END[facility];  Service: Endoscopy;  Laterality: N/A;  9:30  . CORONARY ARTERY BYPASS GRAFT     4 vessels  . heart bypass    . INSERTION OF MESH  02/07/2015   Procedure: INSERTION OF MESH;  Surgeon: Aviva Signs Md, MD;  Location: [facility] ORS;  Service: General;;  . PARTIAL COLECTOMY N/A 09/17/2018   Procedure: PARTIAL COLECTOMY WITH PARTIAL WEDGE RESECTION LIVER METASTASIS;  Surgeon: Aviva Signs, MD;  Location: [facility] ORS;  Service: General;  Laterality: N/A;  . POLYPECTOMY  08/29/2018   Procedure: POLYPECTOMY;  Surgeon: Daneil Dolin, MD;  Location: [facility] END[facility];  Service: Endoscopy;;  . PORTACATH PLACEMENT Left 10/10/2018   Procedure: INSERTION PORT-A-CATH (catheter in left subclavian);  Surgeon: Aviva Signs, MD;  Location: [facility] ORS;  Service: General;  Laterality: Left;  . UMBILICAL HERNIA REPAIR N/A 02/07/2015   Procedure: UMBILICAL HERNIORRHAPHY WITH MESH;  Surgeon: Aviva Signs Md, MD;  Location: [facility] ORS;  Service: General;  Laterality: N/A;     SOCIAL HISTORY:  Social History   Socioeconomic History  . Marital status: Married    Spouse name: Not on file  . Number of children: 3  . Years of education: Not on file  . Highest education level: Not on file  Occupational History  . 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 Financial resource strain: Not hard at all  . Food insecurity    Worry: Never true    Inability: Never true  . Transportation needs    Medical: No    Non-medical: No  Tobacco Use  . Smoking status: Never Smoker  . Smokeless  tobacco: Never Used  Substance and Sexual Activity  . Alcohol use: No  . Drug use: No  . Sexual activity: Yes    Birth control/protection: None  Lifestyle  . Physical activity    Days per week: 0 days    Minutes per session: 0 min  . Stress: Not at all  Relationships  . Social connections    Talks on phone: More than three times a week    Gets together: More than three times a week    Attends religious service: Never    Active member of club or organization: No    Attends meetings of clubs or organizations: Never    Relationship status: Married  . Intimate partner violence    Fear of current or ex partner: No    Emotionally abused: No    Physically abused: No    Forced sexual activity: No  Other Topics Concern  . Not on file  Social History Narrative  . Not on file    FAMILY HISTORY:  Family History  Problem Relation Age of Onset  . Hypertension Father   . Diabetes Father   . Heart disease Father   . Diabetes Sister   . Hypertension Sister   .  Hypertension Brother   . Diabetes Brother   . Diabetes Sister   . Diabetes Brother   . Heart disease Brother   . Lymphoma Brother   . Hypertension Son     CURRENT MEDICATIONS:  Outpatient Encounter Medications as of 05/07/2019  Medication Sig Note  . amLODipine (NORVASC) 10 MG tablet TAKE ONE TABLET BY MOUTH ONCE DAILY.   Marland Kitchen aspirin EC 81 MG tablet Take 81 mg by mouth daily.   . B-D UF III MINI PEN NEEDLES 31G X 5 MM MISC    . Bevacizumab (AVASTIN IV) Inject 450 mg into the vein every 14 (fourteen) days.   . Coenzyme Q10 (COQ10) 100 MG CAPS Take 100 mg by mouth daily.   Marland Kitchen diltiazem (CARDIZEM CD) 240 MG 24 hr capsule Take 240 mg by mouth daily.   . diphenhydramine-acetaminophen (TYLENOL PM) 25-500 MG TABS tablet Take 2 tablets by mouth at bedtime.   Marland Kitchen ezetimibe-simvastatin (VYTORIN) 10-80 MG per tablet Take 1 tablet by mouth at bedtime.   Marland Kitchen FARXIGA 10 MG TABS tablet    . GAVILYTE-N WITH FLAVOR PACK 420 g solution    .  HUMALOG MIX 75/25 KWIKPEN (75-25) 100 UNIT/ML Kwikpen    . hydrochlorothiazide (MICROZIDE) 12.5 MG capsule Take 12.5 mg by mouth daily.   . Insulin Glargine (BASAGLAR KWIKPEN) 100 UNIT/ML SOPN    . insulin glargine (LANTUS SOLOSTAR) 100 UNIT/ML injection Inject 20 Units into the skin at bedtime.  10/10/2018: Took 10 units  . insulin lispro protamine-lispro (HUMALOG 75/25 MIX) (75-25) 100 UNIT/ML SUSP injection Inject 16 Units into the skin 3 (three) times daily. 10/10/2018: 1/2 dose  . IRINOTECAN HCL IV Inject 360 mg into the vein every 14 (fourteen) days.   Marland Kitchen JANUVIA 100 MG tablet    . LEUCOVORIN CALCIUM IV Inject 808 mg into the vein every 14 (fourteen) days.   Marland Kitchen lidocaine-prilocaine (EMLA) cream Apply small amount over port site one hour prior to appointment. Cover with plastic wrap.   . loperamide (IMODIUM A-D) 2 MG tablet Take 2 capsules after first loose stool and 1 capsule after each loose stool thereafter.   . metFORMIN (GLUCOPHAGE) 1000 MG tablet Take 1,000 mg by mouth 2 (two) times daily.    . metoprolol succinate (TOPROL-XL) 50 MG 24 hr tablet Take 75 mg by mouth 2 (two) times daily.   . niacin (NIASPAN) 500 MG CR tablet Take 500 mg by mouth at bedtime.   Marland Kitchen NOVOLOG MIX 70/30 FLEXPEN (70-30) 100 UNIT/ML FlexPen    . Omega-3 Fatty Acids (FISH OIL PO) Take 1 capsule by mouth daily.   . pantoprazole (PROTONIX) 40 MG tablet Take 40 mg by mouth daily.   . prochlorperazine (COMPAZINE) 10 MG tablet Take 1 tablet (10 mg total) by mouth every 6 (six) hours as needed (NAUSEA).   . ramipril (ALTACE) 10 MG capsule Take 10 mg by mouth daily.   . sodium chloride 0.9 % SOLN with fluorouracil 5 GM/100ML SOLN 200 mg/m2/day Inject 5,650 mg into the vein. Over 46 hours    No facility-administered encounter medications on file as of 05/07/2019.     ALLERGIES:  Allergies  Allergen Reactions  . Anectine [Succinylcholine] Other (See Comments)    Atypical pseudocholinesterase deficiency.       PHYSICAL EXAM:  ECOG Performance status: 1  Vitals:   05/07/19 1529  BP: (!) 153/82  Pulse: 91  Resp: 16  Temp: 98.6 F (37 C)  SpO2: 99%   Filed Weights  05/07/19 1529  Weight: 209 lb 8 oz (95 kg)    Physical Exam Vitals signs reviewed.  Constitutional:      Appearance: Normal appearance.  Cardiovascular:     Rate and Rhythm: Normal rate and regular rhythm.     Heart sounds: Normal heart sounds.  Pulmonary:     Effort: Pulmonary effort is normal.     Breath sounds: Normal breath sounds.  Abdominal:     General: There is no distension.     Palpations: Abdomen is soft. There is no mass.  Musculoskeletal:        General: No swelling.  Skin:    General: Skin is warm.  Neurological:     General: No focal deficit present.     Mental Status: He is alert and oriented to person, place, and time.  Psychiatric:        Mood and Affect: Mood normal.        Behavior: Behavior normal.      LABORATORY DATA:  I have reviewed the labs as listed.  CBC    Component Value Date/Time   WBC 7.9 03/10/2019 0756   RBC 3.96 (L) 03/10/2019 0756   HGB 12.4 (L) 03/10/2019 0756   HCT 37.8 (L) 03/10/2019 0756   PLT 195 03/10/2019 0756   MCV 95.5 03/10/2019 0756   MCH 31.3 03/10/2019 0756   MCHC 32.8 03/10/2019 0756   RDW 17.4 (H) 03/10/2019 0756   LYMPHSABS 2.6 03/10/2019 0756   MONOABS 0.9 03/10/2019 0756   EOSABS 0.1 03/10/2019 0756   BASOSABS 0.1 03/10/2019 0756   CMP Latest Ref Rng & Units 03/10/2019 02/24/2019 02/10/2019  Glucose 70 - 99 mg/dL 227(H) 152(H) 224(H)  BUN 8 - 23 mg/dL '22 18 17  ' Creatinine 0.61 - 1.24 mg/dL 1.04 1.07 1.03  Sodium 135 - 145 mmol/L 137 140 139  Potassium 3.5 - 5.1 mmol/L 4.1 3.6 3.7  Chloride 98 - 111 mmol/L 104 105 105  CO2 22 - 32 mmol/L 22 22 21(L)  Calcium 8.9 - 10.3 mg/dL 9.3 9.8 9.6  Total Protein 6.5 - 8.1 g/dL 7.2 7.5 7.4  Total Bilirubin 0.3 - 1.2 mg/dL 0.4 0.4 0.5  Alkaline Phos 38 - 126 U/L 78 85 83  AST 15 - 41 U/L '28 30 23  ' ALT  0 - 44 U/L 46(H) 36 31       DIAGNOSTIC IMAGING:  I have independently reviewed the scans and discussed with the patient.   I have reviewed Ian Lick LPN's note and agree with the documentation.  I personally performed a face-to-face visit, made revisions and my assessment and plan is as follows.    ASSESSMENT & PLAN:   Malignant neoplasm of sigmoid colon ([name]) 1.  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  Pathology was consistent with invasive moderately differentiated adenocarcinoma arising in a tubular adenoma with tumor invading the submucosa with no lymphovascular invasion.  Cauterized resection margin is positive for tumor. -Preoperative CEA on 09/12/2018 was 46.5. - Sigmoid colon segmental resection on 09/17/2018 showing no residual carcinoma in the sigmoid colon, margins uninvolved, 3 tumor deposits present and 1/12 lymph nodes positive.  Liver biopsy was consistent with metastatic carcinoma (margins not reported). - He never had any history of bleeding per rectum or melena.  No  family history of colorectal cancer.  Brother died of lymphoma. - MRI of the liver on 10/21/2018 shows complex but nonenhancing lesion at the prior wedge biopsy site measuring 3.1 x 2.3 cm, with a complex cystic cavity with fluid fluid level. - PET CT scan on 10/30/2018 did not show any distant metastatic disease other than in the liver.   - 10 cycles of FOLFIRI and Avastin from 11/03/2018 through 03/10/2019.  -Last CEA on 12/15/2018 was 2.  - CT of the chest on 01/23/2019 showed 2-4 mm pulmonary nodule in the right lung, stable from prior study.  No other abnormalities were seen related to malignancy. -MRI of the liver on 01/20/2019 shows decrease in size of the subcapsular liver metastasis,  measuring 2 x 1.9 cm, previously 2.9 x 2.8 cm.  No new areas were seen. -MRI of the liver on 04/11/2027 [facility] shows decreasing subcapsular segment 4 liver lesion consistent with resolving hematoma.  Dr. Crisoforo Oxford has decided to wait and watch this lesion instead of surgical resection. - He is functioning well and gaining his weight.  His energy levels are coming back to normal. -I have recommended doing a CT CAP with contrast for surveillance and a CEA level.  I will see him back after the scans.  2.  Peripheral neuropathy: -He has grade 1 neuropathy in the feet from longstanding diabetes which is stable.  3.  Hypertension: -His blood pressure is 153/82.  He will continue Altace and HCTZ.  He will also continue Norvasc.   Total time spent is 25 minutes with more than 50% of the time spent face-to-face discussing treatment related side effects and coordination of care.     Orders placed this encounter:  Orders Placed This Encounter  Procedures  . CT Chest W Contrast  . CT Abdomen Pelvis W Contrast  . CBC with Differential/Platelet  . Comprehensive metabolic panel  . CEA      Derek Jack, MD [place] [phone]

## 2019-05-25 ENCOUNTER — Ambulatory Visit (HOSPITAL_COMMUNITY): Payer: 59

## 2019-05-25 ENCOUNTER — Other Ambulatory Visit (HOSPITAL_COMMUNITY): Payer: 59

## 2019-05-27 ENCOUNTER — Ambulatory Visit (HOSPITAL_COMMUNITY): Payer: 59 | Admitting: Hematology

## 2019-06-08 ENCOUNTER — Other Ambulatory Visit (HOSPITAL_COMMUNITY): Payer: 59

## 2019-06-08 ENCOUNTER — Ambulatory Visit (HOSPITAL_COMMUNITY): Admission: RE | Admit: 2019-06-08 | Payer: 59 | Source: Ambulatory Visit

## 2019-06-11 ENCOUNTER — Ambulatory Visit (HOSPITAL_COMMUNITY): Payer: 59 | Admitting: Hematology

## 2019-09-13 ENCOUNTER — Emergency Department (HOSPITAL_COMMUNITY): Payer: 59

## 2019-09-13 ENCOUNTER — Encounter (HOSPITAL_COMMUNITY): Payer: Self-pay | Admitting: *Deleted

## 2019-09-13 ENCOUNTER — Other Ambulatory Visit: Payer: Self-pay

## 2019-09-13 ENCOUNTER — Inpatient Hospital Stay (HOSPITAL_COMMUNITY)
Admission: EM | Admit: 2019-09-13 | Discharge: 2019-09-16 | DRG: 246 | Disposition: A | Discharge status: Home / Self Care | Payer: 59 | Attending: Cardiology | Admitting: Cardiology

## 2019-09-13 DIAGNOSIS — R000 Tachycardia, unspecified: Secondary | ICD-10-CM | POA: Diagnosis present

## 2019-09-13 DIAGNOSIS — Z20828 Contact with and (suspected) exposure to other viral communicable diseases: Secondary | ICD-10-CM | POA: Diagnosis not present

## 2019-09-13 DIAGNOSIS — Z79899 Other long term (current) drug therapy: Secondary | ICD-10-CM

## 2019-09-13 DIAGNOSIS — Z8249 Family history of ischemic heart disease and other diseases of the circulatory system: Secondary | ICD-10-CM

## 2019-09-13 DIAGNOSIS — E1165 Type 2 diabetes mellitus with hyperglycemia: Secondary | ICD-10-CM | POA: Diagnosis not present

## 2019-09-13 DIAGNOSIS — I10 Essential (primary) hypertension: Secondary | ICD-10-CM | POA: Diagnosis present

## 2019-09-13 DIAGNOSIS — C787 Secondary malignant neoplasm of liver and intrahepatic bile duct: Secondary | ICD-10-CM | POA: Diagnosis not present

## 2019-09-13 DIAGNOSIS — Z7982 Long term (current) use of aspirin: Secondary | ICD-10-CM

## 2019-09-13 DIAGNOSIS — I25110 Atherosclerotic heart disease of native coronary artery with unstable angina pectoris: Secondary | ICD-10-CM | POA: Diagnosis present

## 2019-09-13 DIAGNOSIS — I340 Nonrheumatic mitral (valve) insufficiency: Secondary | ICD-10-CM | POA: Diagnosis not present

## 2019-09-13 DIAGNOSIS — Z85038 Personal history of other malignant neoplasm of large intestine: Secondary | ICD-10-CM | POA: Diagnosis not present

## 2019-09-13 DIAGNOSIS — Z833 Family history of diabetes mellitus: Secondary | ICD-10-CM

## 2019-09-13 DIAGNOSIS — E1151 Type 2 diabetes mellitus with diabetic peripheral angiopathy without gangrene: Secondary | ICD-10-CM | POA: Diagnosis not present

## 2019-09-13 DIAGNOSIS — Z951 Presence of aortocoronary bypass graft: Secondary | ICD-10-CM

## 2019-09-13 DIAGNOSIS — Z9049 Acquired absence of other specified parts of digestive tract: Secondary | ICD-10-CM

## 2019-09-13 DIAGNOSIS — E1169 Type 2 diabetes mellitus with other specified complication: Secondary | ICD-10-CM | POA: Diagnosis present

## 2019-09-13 DIAGNOSIS — N179 Acute kidney failure, unspecified: Secondary | ICD-10-CM | POA: Diagnosis present

## 2019-09-13 DIAGNOSIS — I25810 Atherosclerosis of coronary artery bypass graft(s) without angina pectoris: Secondary | ICD-10-CM | POA: Diagnosis not present

## 2019-09-13 DIAGNOSIS — Z888 Allergy status to other drugs, medicaments and biological substances status: Secondary | ICD-10-CM

## 2019-09-13 DIAGNOSIS — I2584 Coronary atherosclerosis due to calcified coronary lesion: Secondary | ICD-10-CM | POA: Diagnosis present

## 2019-09-13 DIAGNOSIS — Z955 Presence of coronary angioplasty implant and graft: Secondary | ICD-10-CM | POA: Diagnosis not present

## 2019-09-13 DIAGNOSIS — I214 Non-ST elevation (NSTEMI) myocardial infarction: Principal | ICD-10-CM | POA: Diagnosis present

## 2019-09-13 DIAGNOSIS — F419 Anxiety disorder, unspecified: Secondary | ICD-10-CM | POA: Diagnosis present

## 2019-09-13 DIAGNOSIS — I213 ST elevation (STEMI) myocardial infarction of unspecified site: Secondary | ICD-10-CM | POA: Diagnosis not present

## 2019-09-13 DIAGNOSIS — I25710 Atherosclerosis of autologous vein coronary artery bypass graft(s) with unstable angina pectoris: Secondary | ICD-10-CM | POA: Diagnosis present

## 2019-09-13 DIAGNOSIS — E1142 Type 2 diabetes mellitus with diabetic polyneuropathy: Secondary | ICD-10-CM | POA: Diagnosis not present

## 2019-09-13 DIAGNOSIS — E781 Pure hyperglyceridemia: Secondary | ICD-10-CM | POA: Diagnosis present

## 2019-09-13 DIAGNOSIS — E785 Hyperlipidemia, unspecified: Secondary | ICD-10-CM | POA: Diagnosis present

## 2019-09-13 DIAGNOSIS — K219 Gastro-esophageal reflux disease without esophagitis: Secondary | ICD-10-CM | POA: Diagnosis not present

## 2019-09-13 DIAGNOSIS — IMO0002 Reserved for concepts with insufficient information to code with codable children: Secondary | ICD-10-CM | POA: Diagnosis present

## 2019-09-13 DIAGNOSIS — Z23 Encounter for immunization: Secondary | ICD-10-CM

## 2019-09-13 DIAGNOSIS — Z794 Long term (current) use of insulin: Secondary | ICD-10-CM

## 2019-09-13 DIAGNOSIS — I2510 Atherosclerotic heart disease of native coronary artery without angina pectoris: Secondary | ICD-10-CM | POA: Diagnosis not present

## 2019-09-13 LAB — CBC WITH DIFFERENTIAL/PLATELET
Abs Immature Granulocytes: 0.04 10*3/uL (ref 0.00–0.07)
Basophils Absolute: 0.1 10*3/uL (ref 0.0–0.1)
Basophils Relative: 1 %
Eosinophils Absolute: 0.2 10*3/uL (ref 0.0–0.5)
Eosinophils Relative: 2 %
HCT: 43.2 % (ref 39.0–52.0)
Hemoglobin: 13.8 g/dL (ref 13.0–17.0)
Immature Granulocytes: 1 %
Lymphocytes Relative: 41 %
Lymphs Abs: 3.4 10*3/uL (ref 0.7–4.0)
MCH: 28.9 pg (ref 26.0–34.0)
MCHC: 31.9 g/dL (ref 30.0–36.0)
MCV: 90.4 fL (ref 80.0–100.0)
Monocytes Absolute: 0.9 10*3/uL (ref 0.1–1.0)
Monocytes Relative: 11 %
Neutro Abs: 3.7 10*3/uL (ref 1.7–7.7)
Neutrophils Relative %: 44 %
Platelets: 261 10*3/uL (ref 150–400)
RBC: 4.78 MIL/uL (ref 4.22–5.81)
RDW: 15.7 % — ABNORMAL HIGH (ref 11.5–15.5)
WBC: 8.3 10*3/uL (ref 4.0–10.5)
nRBC: 0 % (ref 0.0–0.2)

## 2019-09-13 LAB — PROTIME-INR
INR: 1 (ref 0.8–1.2)
Prothrombin Time: 12.8 seconds (ref 11.4–15.2)

## 2019-09-13 LAB — APTT: aPTT: 29 seconds (ref 24–36)

## 2019-09-13 MED ORDER — SODIUM CHLORIDE 0.9 % IV SOLN
INTRAVENOUS | Status: DC
  Administered 2019-09-13: 23:00:00 via INTRAVENOUS

## 2019-09-13 MED ORDER — NITROGLYCERIN IN D5W 200-5 MCG/ML-% IV SOLN
0.0000 ug/min | INTRAVENOUS | Status: DC
  Administered 2019-09-13: 5 ug/min via INTRAVENOUS
  Administered 2019-09-14: 20 ug/min via INTRAVENOUS
  Filled 2019-09-13: qty 250

## 2019-09-13 MED ORDER — ASPIRIN 81 MG PO CHEW
324.0000 mg | CHEWABLE_TABLET | Freq: Once | ORAL | Status: AC
  Administered 2019-09-13: 23:00:00 324 mg via ORAL
  Filled 2019-09-13: qty 4

## 2019-09-13 MED ORDER — HEPARIN (PORCINE) 25000 UT/250ML-% IV SOLN
1100.0000 [IU]/h | INTRAVENOUS | Status: DC
  Administered 2019-09-13 (×2): 1100 [IU]/h via INTRAVENOUS

## 2019-09-13 MED ORDER — HEPARIN (PORCINE) 25000 UT/250ML-% IV SOLN
INTRAVENOUS | Status: AC
  Administered 2019-09-13: 1100 [IU]/h via INTRAVENOUS
  Filled 2019-09-13: qty 250

## 2019-09-13 MED ORDER — HEPARIN SODIUM (PORCINE) 5000 UNIT/ML IJ SOLN
4000.0000 [IU] | Freq: Once | INTRAMUSCULAR | Status: AC
  Administered 2019-09-13: 4000 [IU] via INTRAVENOUS

## 2019-09-13 MED ORDER — MORPHINE SULFATE (PF) 4 MG/ML IV SOLN
INTRAVENOUS | Status: AC
  Filled 2019-09-13: qty 1

## 2019-09-13 MED ORDER — MORPHINE SULFATE (PF) 4 MG/ML IV SOLN
4.0000 mg | Freq: Once | INTRAVENOUS | Status: AC
  Administered 2019-09-13: 4 mg via INTRAVENOUS

## 2019-09-13 NOTE — ED Triage Notes (Signed)
Pt c/o intermittent left side chest pain that radiates to left arm that started a week ago, pain is also associated with nausea, sob, pt has been taking nitro at home with relief but ran out of nitro over the weekend,

## 2019-09-13 NOTE — ED Provider Notes (Signed)
[facility] EMERGENCY DEPARTMENT Provider Note   CSN: [id number] Arrival date & time: 09/13/19  2303   Time seen 23:11 PM  History   Chief Complaint Chief Complaint  Patient presents with  . Code STEMI    HPI Ian [name] is a 63 y.o. male.     HPI patient reports about 3 weeks ago he started having left-sided chest pain.  He was seen by his cardiologist in [place] and was given sublingual nitroglycerin glycerin to use which he states has been helping.  He states last night he had an episode that lasted about 30 minutes.  He unfortunately ran out of his nitroglycerin tablets a few days ago.  Tonight while watching TV about 9:30 PM he again got the pain that he describes as sharp and points to his left upper chest area and states it seems to radiate from his back forward and goes down his left arm to his fingertips.  He states it makes him feel short of breath and he gets clammy.  He has had nausea without vomiting.  He states tonight the pain has been more intense.  Patient has a history of CABG about 10 years ago, he has not had any stents since his CABG.  Patient states he has a history of cancer he states of his liver however looking at his chart it was of the sigmoid colon with mets to the liver.  He states however he is not currently being treated for it.  PCP Sharilyn Sites, MD   Past Medical History:  Diagnosis Date  . 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 Colon cancer ([place])   . Complication of anesthesia    atypical pseudo-cholinesterase deficiency  . Diabetes mellitus without complication ([place])   . Family history of adverse reaction to anesthesia    sister also has atypical pseudo-cholinesterase deficiency  . GERD (gastroesophageal reflux disease)   . History of heart bypass surgery   . Hypertension     Patient Active Problem List   Diagnosis Date Noted  . S/P partial colectomy 09/17/2018  . Malignant neoplasm of sigmoid colon ([place])   . Liver mass,  right lobe   . 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 Palpitations, recurrent 01/10/2013  . Obesity 01/10/2013  . HTN (hypertension) 01/10/2013  . CAD in native artery 01/10/2013  . Other and unspecified hyperlipidemia 01/10/2013  . OSA (obstructive sleep apnea), probable 01/10/2013  . Diabetes ([place]) 01/10/2013  . 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 Chest tightness 01/10/2013    Past Surgical History:  Procedure Laterality Date  . COLONOSCOPY N/A 08/29/2018   Procedure: COLONOSCOPY;  Surgeon: Daneil Dolin, MD;  Location: [facility] ENDO SUITE;  Service: Endoscopy;  Laterality: N/A;  9:30  . CORONARY ARTERY BYPASS GRAFT     4 vessels  . heart bypass    . INSERTION OF MESH  02/07/2015   Procedure: INSERTION OF MESH;  Surgeon: Aviva Signs Md, MD;  Location: [facility] ORS;  Service: General;;  . PARTIAL COLECTOMY N/A 09/17/2018   Procedure: PARTIAL COLECTOMY WITH PARTIAL WEDGE RESECTION LIVER METASTASIS;  Surgeon: Aviva Signs, MD;  Location: [facility] ORS;  Service: General;  Laterality: N/A;  . POLYPECTOMY  08/29/2018   Procedure: POLYPECTOMY;  Surgeon: Daneil Dolin, MD;  Location: [facility] ENDO SUITE;  Service: Endoscopy;;  . PORTACATH PLACEMENT Left 10/10/2018   Procedure: INSERTION PORT-A-CATH (catheter in left subclavian);  Surgeon: Aviva Signs, MD;  Location: [facility] ORS;  Service: General;  Laterality: Left;  .  UMBILICAL HERNIA REPAIR N/A 02/07/2015   Procedure: UMBILICAL HERNIORRHAPHY WITH MESH;  Surgeon: Aviva Signs Md, MD;  Location: [facility] ORS;  Service: General;  Laterality: N/A;        Home Medications    Prior to Admission medications   Medication Sig Start Date End Date Taking? Authorizing Provider  amLODipine (NORVASC) 10 MG tablet TAKE ONE TABLET BY MOUTH ONCE DAILY. 03/23/19   Lockamy, Randi L, NP-C  aspirin EC 81 MG tablet Take 81 mg by mouth daily.    [provider]  B-D UF III MINI PEN NEEDLES 31G X 5 MM MISC  11/20/18   [provider]   Bevacizumab (AVASTIN IV) Inject 450 mg into the vein every 14 (fourteen) days.    [provider]  Coenzyme Q10 (COQ10) 100 MG CAPS Take 100 mg by mouth daily.    [provider]  diltiazem (CARDIZEM CD) 240 MG 24 hr capsule Take 240 mg by mouth daily.    [provider]  diphenhydramine-acetaminophen (TYLENOL PM) 25-500 MG TABS tablet Take 2 tablets by mouth at bedtime.    [provider]  ezetimibe-simvastatin (VYTORIN) 10-80 MG per tablet Take 1 tablet by mouth at bedtime.    [provider]  FARXIGA 10 MG TABS tablet  12/19/18   [provider]  GAVILYTE-N WITH FLAVOR PACK 420 g solution  09/09/18   [provider]  HUMALOG MIX 75/25 KWIKPEN (75-25) 100 UNIT/ML Kwikpen  10/20/18   [provider]  hydrochlorothiazide (MICROZIDE) 12.5 MG capsule Take 12.5 mg by mouth daily.    [provider]  Insulin Glargine [facility]) 100 UNIT/ML SOPN  12/10/18   [provider]  insulin glargine (LANTUS SOLOSTAR) 100 UNIT/ML injection Inject 20 Units into the skin at bedtime.     [provider]  insulin lispro protamine-lispro (HUMALOG 75/25 MIX) (75-25) 100 UNIT/ML SUSP injection Inject 16 Units into the skin 3 (three) times daily.    [provider]  IRINOTECAN HCL IV Inject 360 mg into the vein every 14 (fourteen) days.    [provider]  JANUVIA 100 MG tablet  12/19/18   [provider]  LEUCOVORIN CALCIUM IV Inject 808 mg into the vein every 14 (fourteen) days.    [provider]  lidocaine-prilocaine (EMLA) cream Apply small amount over port site one hour prior to appointment. Cover with plastic wrap. 10/27/18   Derek Jack, MD  loperamide (IMODIUM A-D) 2 MG tablet Take 2 capsules after first loose stool and 1 capsule after each loose stool thereafter. 10/27/18   Derek Jack, MD  metFORMIN (GLUCOPHAGE) 1000 MG tablet Take 1,000 mg by mouth 2 (two)  times daily.  02/01/15   [provider]  metoprolol succinate (TOPROL-XL) 50 MG 24 hr tablet Take 75 mg by mouth 2 (two) times daily.    [provider]  niacin (NIASPAN) 500 MG CR tablet Take 500 mg by mouth at bedtime.    [provider]  NOVOLOG MIX 70/30 FLEXPEN (70-30) 100 UNIT/ML FlexPen  12/04/18   [provider]  Omega-3 Fatty Acids (FISH OIL PO) Take 1 capsule by mouth daily.    [provider]  pantoprazole (PROTONIX) 40 MG tablet Take 40 mg by mouth daily.    [provider]  prochlorperazine (COMPAZINE) 10 MG tablet Take 1 tablet (10 mg total) by mouth every 6 (six) hours as needed (NAUSEA). 10/27/18   Derek Jack, MD  ramipril (ALTACE) 10 MG capsule  Take 10 mg by mouth daily.    [provider]  sodium chloride 0.9 % SOLN with fluorouracil 5 GM/100ML SOLN 200 mg/m2/day Inject 5,650 mg into the vein. Over 46 hours    [provider]    Family History Family History  Problem Relation Age of Onset  . Hypertension Father   . Diabetes Father   . Heart disease Father   . Diabetes Sister   . Hypertension Sister   . Hypertension Brother   . Diabetes Brother   . Diabetes Sister   . Diabetes Brother   . Heart disease Brother   . Lymphoma Brother   . Hypertension Son     Social History Social History   Tobacco Use  . Smoking status: Never Smoker  . Smokeless tobacco: Never Used  Substance Use Topics  . Alcohol use: No  . Drug use: No  lives at home Lives with spouse   Allergies   Anectine [succinylcholine]   Review of Systems Review of Systems  All other systems reviewed and are negative.    Physical Exam Updated Vital Signs BP (!) 185/104   Pulse (!) 115   Temp 97.7 F (36.5 C) (Oral)   Resp (!) 25   Ht 5\' 4"  (1.626 m)   Wt 89.8 kg   SpO2 97%   BMI 33.99 kg/m   Vital signs normal except for hypertension and tachycardia   Physical Exam Vitals signs and nursing note  reviewed.  Constitutional:      General: He is not in acute distress.    Appearance: Normal appearance. He is well-developed. He is not ill-appearing or toxic-appearing.  HENT:     Head: Normocephalic and atraumatic.     Right Ear: External ear normal.     Left Ear: External ear normal.     Nose: Nose normal. No mucosal edema or rhinorrhea.     Mouth/Throat:     Dentition: No dental abscesses.     Pharynx: No uvula swelling.  Eyes:     Extraocular Movements: Extraocular movements intact.     Conjunctiva/sclera: Conjunctivae normal.     Pupils: Pupils are equal, round, and reactive to light.  Neck:     Musculoskeletal: Full passive range of motion without pain, normal range of motion and neck supple.  Cardiovascular:     Rate and Rhythm: Normal rate and regular rhythm.     Heart sounds: Normal heart sounds. No murmur. No friction rub. No gallop.   Pulmonary:     Effort: Pulmonary effort is normal. No respiratory distress.     Breath sounds: Normal breath sounds. No wheezing, rhonchi or rales.  Chest:     Chest wall: No tenderness or crepitus.       Comments: Area of pain noted Abdominal:     General: Bowel sounds are normal. There is no distension.     Palpations: Abdomen is soft.     Tenderness: There is no abdominal tenderness. There is no guarding or rebound.  Musculoskeletal: Normal range of motion.        General: No tenderness.     Comments: Moves all extremities well.   Skin:    General: Skin is warm and dry.     Coloration: Skin is not pale.     Findings: No erythema or rash.  Neurological:     General: No focal deficit present.     Mental Status: He is alert and oriented to person, place, and time.  Cranial Nerves: No cranial nerve deficit.  Psychiatric:        Mood and Affect: Mood normal. Mood is not anxious.        Speech: Speech normal.        Behavior: Behavior normal.        Thought Content: Thought content normal.      ED Treatments / Results   Labs (all labs ordered are listed, but only abnormal results are displayed) Results for orders placed or performed during the hospital encounter of 09/13/19  SARS Coronavirus 2 by RT PCR (hospital order, performed in [place] hospital lab) Nasopharyngeal Nasopharyngeal Swab   Specimen: Nasopharyngeal Swab  Result Value Ref Range   SARS Coronavirus 2 NEGATIVE NEGATIVE  CBC with Differential/Platelet  Result Value Ref Range   WBC 8.3 4.0 - 10.5 K/uL   RBC 4.78 4.22 - 5.81 MIL/uL   Hemoglobin 13.8 13.0 - 17.0 g/dL   HCT 43.2 39.0 - 52.0 %   MCV 90.4 80.0 - 100.0 fL   MCH 28.9 26.0 - 34.0 pg   MCHC 31.9 30.0 - 36.0 g/dL   RDW 15.7 (H) 11.5 - 15.5 %   Platelets 261 150 - 400 K/uL   nRBC 0.0 0.0 - 0.2 %   Neutrophils Relative % 44 %   Neutro Abs 3.7 1.7 - 7.7 K/uL   Lymphocytes Relative 41 %   Lymphs Abs 3.4 0.7 - 4.0 K/uL   Monocytes Relative 11 %   Monocytes Absolute 0.9 0.1 - 1.0 K/uL   Eosinophils Relative 2 %   Eosinophils Absolute 0.2 0.0 - 0.5 K/uL   Basophils Relative 1 %   Basophils Absolute 0.1 0.0 - 0.1 K/uL   Immature Granulocytes 1 %   Abs Immature Granulocytes 0.04 0.00 - 0.07 K/uL  Protime-INR  Result Value Ref Range   Prothrombin Time 12.8 11.4 - 15.2 seconds   INR 1.0 0.8 - 1.2  APTT  Result Value Ref Range   aPTT 29 24 - 36 seconds  Comprehensive metabolic panel  Result Value Ref Range   Sodium 137 135 - 145 mmol/L   Potassium 3.7 3.5 - 5.1 mmol/L   Chloride 102 98 - 111 mmol/L   CO2 20 (L) 22 - 32 mmol/L   Glucose, Bld 393 (H) 70 - 99 mg/dL   BUN 21 8 - 23 mg/dL   Creatinine, Ser 1.34 (H) 0.61 - 1.24 mg/dL   Calcium 9.2 8.9 - 10.3 mg/dL   Total Protein 7.9 6.5 - 8.1 g/dL   Albumin 4.2 3.5 - 5.0 g/dL   AST 25 15 - 41 U/L   ALT 28 0 - 44 U/L   Alkaline Phosphatase 94 38 - 126 U/L   Total Bilirubin 0.5 0.3 - 1.2 mg/dL   GFR calc non Af Amer 56 (L) >60 mL/min   GFR calc Af Amer >60 >60 mL/min   Anion gap 15 5 - 15  Lipid panel  Result Value Ref  Range   Cholesterol 227 (H) 0 - 200 mg/dL   Triglycerides 517 (H) <150 mg/dL   HDL 34 (L) >40 mg/dL   Total CHOL/HDL Ratio 6.7 RATIO   VLDL UNABLE TO CALCULATE IF TRIGLYCERIDE OVER 400 mg/dL 0 - 40 mg/dL   LDL Cholesterol UNABLE TO CALCULATE IF TRIGLYCERIDE OVER 400 mg/dL 0 - 99 mg/dL  Troponin I (High Sensitivity)  Result Value Ref Range   Troponin I (High Sensitivity) 171 (HH) <18 ng/L   Laboratory interpretation all normal except  elevated initial troponin, renal insufficiency, hyperglycemia     EKG EKG Interpretation  Date/Time:  Sunday September 13 2019 23:11:39 EDT Ventricular Rate:  115 PR Interval:    QRS Duration: 101 QT Interval:  290 QTC Calculation: 401 R Axis:   87 Text Interpretation:  Sinus tachycardia Borderline right axis deviation Since last tracing 12 Sep 2018 Repol abnrm, severe global ischemia (LM/MVD) Baseline wander in lead(s) V3 V5 Confirmed by Rolland Porter [phone]) on 09/13/2019 11:16:31 PM   Radiology Dg Chest Port 1 View  Result Date: 09/13/2019 CLINICAL DATA:  Chest pain on the left EXAM: PORTABLE CHEST 1 VIEW COMPARISON:  10/10/2018 FINDINGS: Cardiac shadow is stable. Postsurgical changes are again seen. Left chest wall port is again noted. Lungs are well aerated bilaterally. Minimal basilar atelectasis is seen. No confluent infiltrate is not no bony abnormality is seen. IMPRESSION: Minimal basilar atelectasis. Electronically Signed   By: Inez Catalina M.D.   On: 09/13/2019 23:32    Procedures .Critical Care Performed by: Rolland Porter, MD Authorized by: Rolland Porter, MD   Critical care provider statement:    Critical care time (minutes):  31   Critical care was necessary to treat or prevent imminent or life-threatening deterioration of the following conditions:  Cardiac failure   Critical care was time spent personally by me on the following activities:  Discussions with consultants, examination of patient, obtaining history from patient or surrogate,  ordering and review of laboratory studies, ordering and review of radiographic studies, pulse oximetry, re-evaluation of patient's condition and review of old charts   (including critical care time)  Medications Ordered in ED Medications  0.9 %  sodium chloride infusion ( Intravenous New Bag/Given 09/13/19 2329)  heparin ADULT infusion 100 units/mL (25000 units/274mL sodium chloride 0.45%) (0 Units/hr Intravenous Stopped 09/14/19 0016)  nitroGLYCERIN 50 mg in dextrose 5 % 250 mL (0.2 mg/mL) infusion ( Intravenous MAR Hold 09/14/19 0016)  aspirin chewable tablet 324 mg (324 mg Oral Given 09/13/19 2323)  heparin injection 4,000 Units (4,000 Units Intravenous Bolus 09/13/19 2332)  morphine 4 MG/ML injection 4 mg (4 mg Intravenous Given 09/13/19 2340)     Initial Impression / Assessment and Plan / ED Course  I have reviewed the triage vital signs and the nursing notes.  Pertinent labs & imaging results that were available during my care of the patient were reviewed by me and considered in my medical decision making (see chart for details).    Review of care everywhere shows he is followed by Dr. Lamonte Sakai at [facility].  He had a cardiology scan on September 17 however I am unable to see those results.  They describe his EKG showing some T wave abnormality in the lateral leads compared to an EKG in Mar 21, 2019.  It appears he was started on Imdur ER 30 mg 24-hour tablet on September 17 and also was given 25 tablets of sublingual nitroglycerin at that visit.   Code STEMI was called and orders started.  Patient was given aspirin, he was started on nitroglycerin and heparin drip and bolus.  11:38 PM patient states his pain is down to a 4.  He is on a nitroglycerin drip and heparin drip.  He was given morphine 4 mg IV.  EMS is in the room and putting patient on their stretcher.  11:38 PM Dr. Burt Knack, cardiology called and patient was discussed.  He has accepted in transfer to [facility].    Final Clinical Impressions(s) / ED Diagnoses  Final diagnoses:  ST elevation myocardial infarction (STEMI), unspecified artery ([place])    Plan transfer to [facility] to go to cath lab  Rolland Porter, MD, Barbette Or, MD 09/14/19 0030

## 2019-09-14 ENCOUNTER — Encounter (HOSPITAL_COMMUNITY): Payer: Self-pay | Admitting: Cardiovascular Disease

## 2019-09-14 ENCOUNTER — Encounter (HOSPITAL_COMMUNITY)
Admission: EM | Disposition: A | Discharge status: Home / Self Care | Payer: Self-pay | Source: Home / Self Care | Attending: Cardiovascular Disease

## 2019-09-14 ENCOUNTER — Inpatient Hospital Stay (HOSPITAL_COMMUNITY): Payer: 59

## 2019-09-14 DIAGNOSIS — I340 Nonrheumatic mitral (valve) insufficiency: Secondary | ICD-10-CM | POA: Diagnosis present

## 2019-09-14 DIAGNOSIS — I25810 Atherosclerosis of coronary artery bypass graft(s) without angina pectoris: Secondary | ICD-10-CM

## 2019-09-14 DIAGNOSIS — Z955 Presence of coronary angioplasty implant and graft: Secondary | ICD-10-CM | POA: Diagnosis not present

## 2019-09-14 DIAGNOSIS — Z833 Family history of diabetes mellitus: Secondary | ICD-10-CM | POA: Diagnosis not present

## 2019-09-14 DIAGNOSIS — F419 Anxiety disorder, unspecified: Secondary | ICD-10-CM | POA: Diagnosis present

## 2019-09-14 DIAGNOSIS — I213 ST elevation (STEMI) myocardial infarction of unspecified site: Secondary | ICD-10-CM

## 2019-09-14 DIAGNOSIS — E1142 Type 2 diabetes mellitus with diabetic polyneuropathy: Secondary | ICD-10-CM | POA: Diagnosis present

## 2019-09-14 DIAGNOSIS — I214 Non-ST elevation (NSTEMI) myocardial infarction: Secondary | ICD-10-CM | POA: Diagnosis present

## 2019-09-14 DIAGNOSIS — Z8249 Family history of ischemic heart disease and other diseases of the circulatory system: Secondary | ICD-10-CM | POA: Diagnosis not present

## 2019-09-14 DIAGNOSIS — I10 Essential (primary) hypertension: Secondary | ICD-10-CM | POA: Diagnosis present

## 2019-09-14 DIAGNOSIS — R000 Tachycardia, unspecified: Secondary | ICD-10-CM | POA: Diagnosis present

## 2019-09-14 DIAGNOSIS — E1151 Type 2 diabetes mellitus with diabetic peripheral angiopathy without gangrene: Secondary | ICD-10-CM | POA: Diagnosis not present

## 2019-09-14 DIAGNOSIS — E1165 Type 2 diabetes mellitus with hyperglycemia: Secondary | ICD-10-CM | POA: Diagnosis present

## 2019-09-14 DIAGNOSIS — K219 Gastro-esophageal reflux disease without esophagitis: Secondary | ICD-10-CM | POA: Diagnosis present

## 2019-09-14 DIAGNOSIS — E1169 Type 2 diabetes mellitus with other specified complication: Secondary | ICD-10-CM | POA: Diagnosis present

## 2019-09-14 DIAGNOSIS — N179 Acute kidney failure, unspecified: Secondary | ICD-10-CM | POA: Diagnosis present

## 2019-09-14 DIAGNOSIS — Z7982 Long term (current) use of aspirin: Secondary | ICD-10-CM | POA: Diagnosis not present

## 2019-09-14 DIAGNOSIS — Z9049 Acquired absence of other specified parts of digestive tract: Secondary | ICD-10-CM | POA: Diagnosis not present

## 2019-09-14 DIAGNOSIS — I25110 Atherosclerotic heart disease of native coronary artery with unstable angina pectoris: Secondary | ICD-10-CM | POA: Diagnosis present

## 2019-09-14 DIAGNOSIS — Z794 Long term (current) use of insulin: Secondary | ICD-10-CM | POA: Diagnosis not present

## 2019-09-14 DIAGNOSIS — Z888 Allergy status to other drugs, medicaments and biological substances status: Secondary | ICD-10-CM | POA: Diagnosis not present

## 2019-09-14 DIAGNOSIS — Z85038 Personal history of other malignant neoplasm of large intestine: Secondary | ICD-10-CM | POA: Diagnosis not present

## 2019-09-14 DIAGNOSIS — E785 Hyperlipidemia, unspecified: Secondary | ICD-10-CM | POA: Diagnosis present

## 2019-09-14 DIAGNOSIS — Z79899 Other long term (current) drug therapy: Secondary | ICD-10-CM | POA: Diagnosis not present

## 2019-09-14 DIAGNOSIS — Z951 Presence of aortocoronary bypass graft: Secondary | ICD-10-CM | POA: Diagnosis not present

## 2019-09-14 DIAGNOSIS — I2510 Atherosclerotic heart disease of native coronary artery without angina pectoris: Secondary | ICD-10-CM | POA: Diagnosis not present

## 2019-09-14 DIAGNOSIS — Z23 Encounter for immunization: Secondary | ICD-10-CM | POA: Diagnosis not present

## 2019-09-14 DIAGNOSIS — C787 Secondary malignant neoplasm of liver and intrahepatic bile duct: Secondary | ICD-10-CM | POA: Diagnosis present

## 2019-09-14 DIAGNOSIS — Z20828 Contact with and (suspected) exposure to other viral communicable diseases: Secondary | ICD-10-CM | POA: Diagnosis present

## 2019-09-14 HISTORY — PX: CORONARY/GRAFT ACUTE MI REVASCULARIZATION: CATH118305

## 2019-09-14 HISTORY — PX: LEFT HEART CATH AND CORONARY ANGIOGRAPHY: CATH118249

## 2019-09-14 LAB — GLUCOSE, CAPILLARY
Glucose-Capillary: 356 mg/dL — ABNORMAL HIGH (ref 70–99)
Glucose-Capillary: 327 mg/dL — ABNORMAL HIGH (ref 70–99)
Glucose-Capillary: 311 mg/dL — ABNORMAL HIGH (ref 70–99)
Glucose-Capillary: 261 mg/dL — ABNORMAL HIGH (ref 70–99)
Glucose-Capillary: 237 mg/dL — ABNORMAL HIGH (ref 70–99)

## 2019-09-14 LAB — BASIC METABOLIC PANEL
Anion gap: 15 (ref 5–15)
BUN: 16 mg/dL (ref 8–23)
CO2: 25 mmol/L (ref 22–32)
Calcium: 9.2 mg/dL (ref 8.9–10.3)
Chloride: 98 mmol/L (ref 98–111)
Creatinine, Ser: 1.25 mg/dL — ABNORMAL HIGH (ref 0.61–1.24)
GFR calc Af Amer: >60 mL/min (ref >60)
GFR calc non Af Amer: >60 mL/min (ref >60)
Glucose, Bld: 351 mg/dL — ABNORMAL HIGH (ref 70–99)
Potassium: 3.8 mmol/L (ref 3.5–5.1)
Sodium: 138 mmol/L (ref 135–145)

## 2019-09-14 LAB — CREATININE, SERUM
Creatinine, Ser: 1.27 mg/dL — ABNORMAL HIGH (ref 0.61–1.24)
GFR calc Af Amer: >60 mL/min (ref >60)
GFR calc non Af Amer: >60 mL/min (ref >60)

## 2019-09-14 LAB — CBC
HCT: 40.6 % (ref 39.0–52.0)
Hemoglobin: 13.6 g/dL (ref 13.0–17.0)
MCH: 29.8 pg (ref 26.0–34.0)
MCHC: 33.5 g/dL (ref 30.0–36.0)
MCV: 89 fL (ref 80.0–100.0)
Platelets: 273 10*3/uL (ref 150–400)
RBC: 4.56 MIL/uL (ref 4.22–5.81)
RDW: 15.5 % (ref 11.5–15.5)
WBC: 10.9 10*3/uL — ABNORMAL HIGH (ref 4.0–10.5)
nRBC: 0 % (ref 0.0–0.2)

## 2019-09-14 LAB — COMPREHENSIVE METABOLIC PANEL
ALT: 28 U/L (ref 0–44)
AST: 25 U/L (ref 15–41)
Albumin: 4.2 g/dL (ref 3.5–5.0)
Alkaline Phosphatase: 94 U/L (ref 38–126)
Anion gap: 15 (ref 5–15)
BUN: 21 mg/dL (ref 8–23)
CO2: 20 mmol/L — ABNORMAL LOW (ref 22–32)
Calcium: 9.2 mg/dL (ref 8.9–10.3)
Chloride: 102 mmol/L (ref 98–111)
Creatinine, Ser: 1.34 mg/dL — ABNORMAL HIGH (ref 0.61–1.24)
GFR calc Af Amer: >60 mL/min (ref >60)
GFR calc non Af Amer: 56 mL/min — ABNORMAL LOW (ref >60)
Glucose, Bld: 393 mg/dL — ABNORMAL HIGH (ref 70–99)
Potassium: 3.7 mmol/L (ref 3.5–5.1)
Sodium: 137 mmol/L (ref 135–145)
Total Bilirubin: 0.5 mg/dL (ref 0.3–1.2)
Total Protein: 7.9 g/dL (ref 6.5–8.1)

## 2019-09-14 LAB — ECHOCARDIOGRAM COMPLETE
Height: 64 in
Weight: 3089.97 oz

## 2019-09-14 LAB — LDL CHOLESTEROL, DIRECT
Direct LDL: 124.9 mg/dL — ABNORMAL HIGH (ref 0–99)
Direct LDL: 141.4 mg/dL — ABNORMAL HIGH (ref 0–99)

## 2019-09-14 LAB — LIPID PANEL
Cholesterol: 227 mg/dL — ABNORMAL HIGH (ref 0–200)
Cholesterol: 206 mg/dL — ABNORMAL HIGH (ref 0–200)
HDL: 34 mg/dL — ABNORMAL LOW (ref >40)
HDL: 32 mg/dL — ABNORMAL LOW (ref >40)
LDL Cholesterol: UNDETERMINED mg/dL (ref 0–99)
LDL Cholesterol: UNDETERMINED mg/dL (ref 0–99)
Total CHOL/HDL Ratio: 6.7 RATIO
Total CHOL/HDL Ratio: 6.4 RATIO
Triglycerides: 517 mg/dL — ABNORMAL HIGH (ref <150)
Triglycerides: 410 mg/dL — ABNORMAL HIGH (ref <150)
VLDL: UNDETERMINED mg/dL (ref 0–40)
VLDL: UNDETERMINED mg/dL (ref 0–40)

## 2019-09-14 LAB — HEMOGLOBIN A1C
Hgb A1c MFr Bld: 12 % — ABNORMAL HIGH (ref 4.8–5.6)
Mean Plasma Glucose: 297.7 mg/dL

## 2019-09-14 LAB — MRSA PCR SCREENING: MRSA by PCR: NEGATIVE

## 2019-09-14 LAB — SARS CORONAVIRUS 2 BY RT PCR (HOSPITAL ORDER, PERFORMED IN ~~LOC~~ HOSPITAL LAB): SARS Coronavirus 2: NEGATIVE

## 2019-09-14 LAB — TROPONIN I (HIGH SENSITIVITY)
Troponin I (High Sensitivity): 171 ng/L (ref <18)
Troponin I (High Sensitivity): 11160 ng/L (ref <18)

## 2019-09-14 LAB — POCT ACTIVATED CLOTTING TIME: Activated Clotting Time: 450 seconds

## 2019-09-14 LAB — BRAIN NATRIURETIC PEPTIDE: B Natriuretic Peptide: 141.9 pg/mL — ABNORMAL HIGH (ref 0.0–100.0)

## 2019-09-14 LAB — HIV ANTIBODY (ROUTINE TESTING W REFLEX): HIV Screen 4th Generation wRfx: NONREACTIVE

## 2019-09-14 SURGERY — CORONARY/GRAFT ACUTE MI REVASCULARIZATION
Anesthesia: LOCAL

## 2019-09-14 MED ORDER — TICAGRELOR 90 MG PO TABS
ORAL_TABLET | ORAL | Status: AC
  Filled 2019-09-14: qty 2

## 2019-09-14 MED ORDER — TICAGRELOR 90 MG PO TABS
ORAL_TABLET | ORAL | Status: DC | PRN
  Administered 2019-09-14: 180 mg via ORAL

## 2019-09-14 MED ORDER — MIDAZOLAM HCL 2 MG/2ML IJ SOLN
INTRAMUSCULAR | Status: AC
  Filled 2019-09-14: qty 2

## 2019-09-14 MED ORDER — NITROGLYCERIN 1 MG/10 ML FOR IR/CATH LAB
INTRA_ARTERIAL | Status: DC | PRN
  Administered 2019-09-14 (×2): 150 ug via INTRACORONARY

## 2019-09-14 MED ORDER — CHLORHEXIDINE GLUCONATE CLOTH 2 % EX PADS
6.0000 | MEDICATED_PAD | Freq: Every day | CUTANEOUS | Status: DC
  Administered 2019-09-14 – 2019-09-15 (×2): 6 via TOPICAL

## 2019-09-14 MED ORDER — SODIUM CHLORIDE 0.9 % IV SOLN
INTRAVENOUS | Status: DC | PRN
  Administered 2019-09-14: 1.75 mg/kg/h via INTRAVENOUS

## 2019-09-14 MED ORDER — SODIUM CHLORIDE 0.9 % WEIGHT BASED INFUSION: 1.0000 mL/kg/h | INTRAVENOUS | Status: DC

## 2019-09-14 MED ORDER — LABETALOL HCL 5 MG/ML IV SOLN: 10.0000 mg | INTRAVENOUS | Status: AC | PRN

## 2019-09-14 MED ORDER — IOHEXOL 350 MG/ML SOLN
INTRAVENOUS | Status: DC | PRN
  Administered 2019-09-14: 145 mL via INTRA_ARTERIAL

## 2019-09-14 MED ORDER — ATORVASTATIN CALCIUM 40 MG PO TABS: 40.0000 mg | ORAL_TABLET | Freq: Every day | ORAL | Status: DC

## 2019-09-14 MED ORDER — METOPROLOL SUCCINATE ER 50 MG PO TB24
75.0000 mg | ORAL_TABLET | Freq: Two times a day (BID) | ORAL | Status: DC
  Administered 2019-09-14 (×3): 75 mg via ORAL
  Filled 2019-09-14 (×3): qty 1

## 2019-09-14 MED ORDER — ASPIRIN EC 81 MG PO TBEC
81.0000 mg | DELAYED_RELEASE_TABLET | Freq: Every day | ORAL | Status: DC
  Administered 2019-09-14 – 2019-09-16 (×3): 81 mg via ORAL
  Filled 2019-09-14 (×4): qty 1

## 2019-09-14 MED ORDER — BIVALIRUDIN TRIFLUOROACETATE 250 MG IV SOLR
INTRAVENOUS | Status: AC
  Filled 2019-09-14: qty 250

## 2019-09-14 MED ORDER — ONDANSETRON HCL 4 MG/2ML IJ SOLN: 4.0000 mg | Freq: Four times a day (QID) | INTRAMUSCULAR | Status: DC | PRN

## 2019-09-14 MED ORDER — METOPROLOL TARTRATE 5 MG/5ML IV SOLN
INTRAVENOUS | Status: AC
  Filled 2019-09-14: qty 5

## 2019-09-14 MED ORDER — LIDOCAINE HCL (PF) 1 % IJ SOLN
INTRAMUSCULAR | Status: AC
  Filled 2019-09-14: qty 30

## 2019-09-14 MED ORDER — SODIUM CHLORIDE 0.9 % WEIGHT BASED INFUSION
1.0000 mL/kg/h | INTRAVENOUS | Status: AC
  Administered 2019-09-14: 1 mL/kg/h via INTRAVENOUS

## 2019-09-14 MED ORDER — EZETIMIBE-SIMVASTATIN 10-80 MG PO TABS: 1.0000 | ORAL_TABLET | Freq: Every day | ORAL | Status: DC

## 2019-09-14 MED ORDER — INSULIN ASPART 100 UNIT/ML ~~LOC~~ SOLN
0.0000 [IU] | Freq: Three times a day (TID) | SUBCUTANEOUS | Status: DC
  Administered 2019-09-14: 8 [IU] via SUBCUTANEOUS
  Administered 2019-09-14 (×2): 11 [IU] via SUBCUTANEOUS
  Administered 2019-09-15: 5 [IU] via SUBCUTANEOUS
  Administered 2019-09-15 (×2): 3 [IU] via SUBCUTANEOUS
  Administered 2019-09-16: 11 [IU] via SUBCUTANEOUS
  Administered 2019-09-16: 3 [IU] via SUBCUTANEOUS

## 2019-09-14 MED ORDER — SODIUM CHLORIDE 0.9 % IV SOLN
250.0000 mL | INTRAVENOUS | Status: DC | PRN
  Administered 2019-09-15: 250 mL via INTRAVENOUS

## 2019-09-14 MED ORDER — INSULIN GLARGINE 100 UNIT/ML ~~LOC~~ SOLN
20.0000 [IU] | Freq: Every day | SUBCUTANEOUS | Status: DC
  Administered 2019-09-14 (×2): 20 [IU] via SUBCUTANEOUS
  Filled 2019-09-14 (×3): qty 0.2

## 2019-09-14 MED ORDER — LIDOCAINE HCL (PF) 1 % IJ SOLN
INTRAMUSCULAR | Status: DC | PRN
  Administered 2019-09-14: 10 mL

## 2019-09-14 MED ORDER — HYDRALAZINE HCL 20 MG/ML IJ SOLN: 10.0000 mg | INTRAMUSCULAR | Status: AC | PRN

## 2019-09-14 MED ORDER — VERAPAMIL HCL 2.5 MG/ML IV SOLN
INTRAVENOUS | Status: AC
  Filled 2019-09-14: qty 2

## 2019-09-14 MED ORDER — FENTANYL CITRATE (PF) 100 MCG/2ML IJ SOLN
INTRAMUSCULAR | Status: DC | PRN
  Administered 2019-09-14 (×2): 25 ug via INTRAVENOUS

## 2019-09-14 MED ORDER — FUROSEMIDE 40 MG PO TABS
40.0000 mg | ORAL_TABLET | Freq: Once | ORAL | Status: AC
  Administered 2019-09-14: 40 mg via ORAL
  Filled 2019-09-14: qty 1

## 2019-09-14 MED ORDER — NITROGLYCERIN 1 MG/10 ML FOR IR/CATH LAB
INTRA_ARTERIAL | Status: AC
  Filled 2019-09-14: qty 10

## 2019-09-14 MED ORDER — HEPARIN (PORCINE) IN NACL 1000-0.9 UT/500ML-% IV SOLN
INTRAVENOUS | Status: AC
  Filled 2019-09-14: qty 1000

## 2019-09-14 MED ORDER — OXYCODONE HCL 5 MG PO TABS: 5.0000 mg | ORAL_TABLET | ORAL | Status: DC | PRN

## 2019-09-14 MED ORDER — AMLODIPINE BESYLATE 10 MG PO TABS
10.0000 mg | ORAL_TABLET | Freq: Every day | ORAL | Status: DC
  Administered 2019-09-14 – 2019-09-16 (×3): 10 mg via ORAL
  Filled 2019-09-14 (×3): qty 1

## 2019-09-14 MED ORDER — TICAGRELOR 90 MG PO TABS
90.0000 mg | ORAL_TABLET | Freq: Two times a day (BID) | ORAL | Status: DC
  Administered 2019-09-14 – 2019-09-16 (×6): 90 mg via ORAL
  Filled 2019-09-14 (×6): qty 1

## 2019-09-14 MED ORDER — HEPARIN SODIUM (PORCINE) 5000 UNIT/ML IJ SOLN
5000.0000 [IU] | Freq: Three times a day (TID) | INTRAMUSCULAR | Status: DC
  Administered 2019-09-14 – 2019-09-16 (×3): 5000 [IU] via SUBCUTANEOUS
  Filled 2019-09-14 (×4): qty 1

## 2019-09-14 MED ORDER — ACETAMINOPHEN 325 MG PO TABS: 650.0000 mg | ORAL_TABLET | ORAL | Status: DC | PRN

## 2019-09-14 MED ORDER — FENTANYL CITRATE (PF) 100 MCG/2ML IJ SOLN
INTRAMUSCULAR | Status: AC
  Filled 2019-09-14: qty 2

## 2019-09-14 MED ORDER — SODIUM CHLORIDE 0.9% FLUSH: 3.0000 mL | INTRAVENOUS | Status: DC | PRN

## 2019-09-14 MED ORDER — PANTOPRAZOLE SODIUM 40 MG PO TBEC
40.0000 mg | DELAYED_RELEASE_TABLET | Freq: Every day | ORAL | Status: DC
  Administered 2019-09-14 – 2019-09-16 (×3): 40 mg via ORAL
  Filled 2019-09-14 (×3): qty 1

## 2019-09-14 MED ORDER — MORPHINE SULFATE (PF) 2 MG/ML IV SOLN: 2.0000 mg | INTRAVENOUS | Status: DC | PRN

## 2019-09-14 MED ORDER — METOPROLOL TARTRATE 5 MG/5ML IV SOLN
INTRAVENOUS | Status: DC | PRN
  Administered 2019-09-14: 5 mg via INTRAVENOUS

## 2019-09-14 MED ORDER — MIDAZOLAM HCL 2 MG/2ML IJ SOLN
INTRAMUSCULAR | Status: DC | PRN
  Administered 2019-09-14: 1 mg via INTRAVENOUS
  Administered 2019-09-14: 2 mg via INTRAVENOUS

## 2019-09-14 MED ORDER — ASPIRIN EC 81 MG PO TBEC: 81.0000 mg | DELAYED_RELEASE_TABLET | Freq: Every day | ORAL | Status: DC

## 2019-09-14 MED ORDER — SODIUM CHLORIDE 0.9% FLUSH: 3.0000 mL | Freq: Two times a day (BID) | INTRAVENOUS | Status: DC

## 2019-09-14 MED ORDER — EZETIMIBE 10 MG PO TABS
10.0000 mg | ORAL_TABLET | Freq: Every day | ORAL | Status: DC
  Administered 2019-09-14 – 2019-09-16 (×3): 10 mg via ORAL
  Filled 2019-09-14 (×3): qty 1

## 2019-09-14 MED ORDER — PROCHLORPERAZINE MALEATE 10 MG PO TABS
10.0000 mg | ORAL_TABLET | Freq: Four times a day (QID) | ORAL | Status: DC | PRN
  Filled 2019-09-14: qty 1

## 2019-09-14 MED ORDER — ATORVASTATIN CALCIUM 80 MG PO TABS
80.0000 mg | ORAL_TABLET | Freq: Every day | ORAL | Status: DC
  Administered 2019-09-14 – 2019-09-16 (×3): 80 mg via ORAL
  Filled 2019-09-14 (×3): qty 1

## 2019-09-14 MED ORDER — NITROGLYCERIN 0.4 MG SL SUBL: 0.4000 mg | SUBLINGUAL_TABLET | SUBLINGUAL | Status: DC | PRN

## 2019-09-14 MED ORDER — BIVALIRUDIN BOLUS VIA INFUSION - CUPID
INTRAVENOUS | Status: DC | PRN
  Administered 2019-09-14: 67.35 mg via INTRAVENOUS

## 2019-09-14 MED ORDER — SODIUM CHLORIDE 0.9 % IV SOLN
INTRAVENOUS | Status: AC | PRN
  Administered 2019-09-14: 10 mL/h via INTRAVENOUS

## 2019-09-14 MED ORDER — SODIUM CHLORIDE 0.9 % WEIGHT BASED INFUSION
3.0000 mL/kg/h | INTRAVENOUS | Status: AC
  Administered 2019-09-15: 3 mL/kg/h via INTRAVENOUS

## 2019-09-14 MED ORDER — SODIUM CHLORIDE 0.9 % IV SOLN: 250.0000 mL | INTRAVENOUS | Status: DC | PRN

## 2019-09-14 MED ORDER — RAMIPRIL 10 MG PO CAPS
10.0000 mg | ORAL_CAPSULE | Freq: Every day | ORAL | Status: DC
  Administered 2019-09-14 – 2019-09-16 (×3): 10 mg via ORAL
  Filled 2019-09-14 (×3): qty 1

## 2019-09-14 SURGICAL SUPPLY — 28 items
BALLN MINITREK OTW 1.20X12 (BALLOONS) ×2
BALLN SAPPHIRE 2.0X12 (BALLOONS) ×2
BALLN SAPPHIRE ~~LOC~~ 3.0X15 (BALLOONS) ×1 IMPLANT
BALLOON MINITREK OTW 1.20X12 (BALLOONS) IMPLANT
BALLOON SAPPHIRE 2.0X12 (BALLOONS) IMPLANT
CATH DXT MULTI JL4 JR4 ANG PIG (CATHETERS) ×1 IMPLANT
CATH INFINITI 5 FR RCB (CATHETERS) ×1 IMPLANT
CATHETER LAUNCHER 6FR MP1 (CATHETERS) ×1 IMPLANT
CATHETER LAUNCHER 6FR RCB (CATHETERS) ×1 IMPLANT
DEVICE CLOSURE PERCLS PRGLD 6F (VASCULAR PRODUCTS) IMPLANT
GLIDESHEATH SLEND SS 6F .021 (SHEATH) IMPLANT
GUIDEWIRE INQWIRE 1.5J.035X260 (WIRE) IMPLANT
INQWIRE 1.5J .035X260CM (WIRE)
KIT ENCORE 26 ADVANTAGE (KITS) ×1 IMPLANT
KIT HEART LEFT (KITS) ×2 IMPLANT
PACK CARDIAC CATHETERIZATION (CUSTOM PROCEDURE TRAY) ×2 IMPLANT
PERCLOSE PROGLIDE 6F (VASCULAR PRODUCTS) ×2
SHEATH PINNACLE 6F 10CM (SHEATH) ×1 IMPLANT
SHEATH PROBE COVER 6X72 (BAG) ×1 IMPLANT
STENT RESOLUTE ONYX 2.75X15 (Permanent Stent) ×1 IMPLANT
STENT RESOLUTE ONYX 2.75X22 (Permanent Stent) ×1 IMPLANT
TRANSDUCER W/STOPCOCK (MISCELLANEOUS) ×2 IMPLANT
TUBING CIL FLEX 10 FLL-RA (TUBING) ×2 IMPLANT
WIRE ASAHI FIELDER XT 300CM (WIRE) ×1 IMPLANT
WIRE COUGAR XT STRL 190CM (WIRE) ×1 IMPLANT
WIRE COUGAR XT STRL 300CM (WIRE) ×1 IMPLANT
WIRE EMERALD 3MM-J .035X150CM (WIRE) ×1 IMPLANT
WIRE HI TORQ WHISPER MS 190CM (WIRE) ×1 IMPLANT

## 2019-09-14 NOTE — Plan of Care (Signed)
  Problem: Education: Goal: Knowledge of General Education information will improve Description: Including pain rating scale, medication(s)/side effects and non-pharmacologic comfort measures Outcome: Progressing   Problem: Health Behavior/Discharge Planning: Goal: Ability to manage health-related needs will improve Outcome: Progressing   Problem: Clinical Measurements: Goal: Ability to maintain clinical measurements within normal limits will improve Outcome: Progressing Goal: Will remain free from infection Outcome: Progressing Goal: Diagnostic test results will improve Outcome: Progressing   Problem: Activity: Goal: Risk for activity intolerance will decrease Outcome: Progressing   Problem: Clinical Measurements: Goal: Cardiovascular complication will be avoided Outcome: Completed/Met   Problem: Nutrition: Goal: Adequate nutrition will be maintained Outcome: Completed/Met   Problem: Coping: Goal: Level of anxiety will decrease Outcome: Completed/Met   Problem: Elimination: Goal: Will not experience complications related to bowel motility Outcome: Completed/Met Goal: Will not experience complications related to urinary retention Outcome: Completed/Met

## 2019-09-14 NOTE — ED Notes (Signed)
On arrival to ED, pt immediately transported to cath lab by EMS

## 2019-09-14 NOTE — H&P (Signed)
Cardiology Admission History and Physical:   Patient ID: Ian [name]; MRN: [id number]; DOB: [birth year]   Admission date: 09/13/2019  Primary Care Provider: Sharilyn Sites, MD Primary Cardiologist: No primary care provider on file.    Chief Complaint:  Chest pain  History of Present Illness:   Ian [name] is a 63 y.o. male with a past medical history significant for 4-vessel CABG, metastatic sigmoid colon cancer to the liver (with partial colectomy and wedge resection of the liver), diabetes mellitus, gastroesophageal reflux disease, hypertension and peripheral neuropathy who presented to the hospital with complaints of left-sided chest pain that radiated to the left arm.  He has been having the pain intermittently for the past 7 days.  Today the chest discomfort was associated with diaphoresis, dyspnea and nausea.  For previous episodes he had taken nitroglycerin with some limited relief. He sees a cardiologist at [facility] who had prescribed this medication for him. The pain today was however more severe in intensity.  He had also run out of his nitroglycerin.  In the ED (at the outside hospital) the blood pressure was 179/103 mmHg with a heart rate of 115 bpm. He was treated with aspirin, unfractionated IV heparin bolus and infusion and IV nitroglycerin.  He was also given 4 mg of IV morphine. The ECG showed Q waves in V1 and V2 with ST elevations.  There was also an ST elevation in aVR.  There were diffuse ST depressions in the inferolateral leads. He was therefore transferred to [facility] for consideration for an emergent/urgent cardiac catheterization procedure.  His labs were as follows: Creatinine 1.34, potassium 3.7, glucose 393, hs-Trop 171, hemoglobin 13.8, hematocrit 43.2, and platelets 261.  The chest x-ray revealed minimal basilar atelectasis.   Past Medical History:  Diagnosis Date  . 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 Colon cancer ([place])   .  Complication of anesthesia    atypical pseudo-cholinesterase deficiency  . Diabetes mellitus without complication ([place])   . Family history of adverse reaction to anesthesia    sister also has atypical pseudo-cholinesterase deficiency  . GERD (gastroesophageal reflux disease)   . History of heart bypass surgery   . Hypertension     Past Surgical History:  Procedure Laterality Date  . COLONOSCOPY N/A 08/29/2018   Procedure: COLONOSCOPY;  Surgeon: Daneil Dolin, MD;  Location: [facility];  Service: Endoscopy;  Laterality: N/A;  9:30  . CORONARY ARTERY BYPASS GRAFT     4 vessels  . heart bypass    . INSERTION OF MESH  02/07/2015   Procedure: INSERTION OF MESH;  Surgeon: Aviva Signs Md, MD;  Location: [facility] ORS;  Service: General;;  . PARTIAL COLECTOMY N/A 09/17/2018   Procedure: PARTIAL COLECTOMY WITH PARTIAL WEDGE RESECTION LIVER METASTASIS;  Surgeon: Aviva Signs, MD;  Location: [facility] ORS;  Service: General;  Laterality: N/A;  . POLYPECTOMY  08/29/2018   Procedure: POLYPECTOMY;  Surgeon: Daneil Dolin, MD;  Location: [facility];  Service: Endoscopy;;  . PORTACATH PLACEMENT Left 10/10/2018   Procedure: INSERTION PORT-A-CATH (catheter in left subclavian);  Surgeon: Aviva Signs, MD;  Location: [facility] ORS;  Service: General;  Laterality: Left;  . UMBILICAL HERNIA REPAIR N/A 02/07/2015   Procedure: UMBILICAL HERNIORRHAPHY WITH MESH;  Surgeon: Aviva Signs Md, MD;  Location: [facility] ORS;  Service: General;  Laterality: N/A;     Medications Prior to Admission: Prior to Admission medications   Medication Sig Start Date End Date Taking? Authorizing Provider  amLODipine (NORVASC) 10  MG tablet TAKE ONE TABLET BY MOUTH ONCE DAILY. 03/23/19   Lockamy, Randi L, NP-C  aspirin EC 81 MG tablet Take 81 mg by mouth daily.    [provider]  B-D UF III MINI PEN NEEDLES 31G X 5 MM MISC  11/20/18   [provider]  Bevacizumab (AVASTIN IV) Inject 450 mg into the vein every 14 (fourteen) days.     [provider]  Coenzyme Q10 (COQ10) 100 MG CAPS Take 100 mg by mouth daily.    [provider]  diltiazem (CARDIZEM CD) 240 MG 24 hr capsule Take 240 mg by mouth daily.    [provider]  diphenhydramine-acetaminophen (TYLENOL PM) 25-500 MG TABS tablet Take 2 tablets by mouth at bedtime.    [provider]  ezetimibe-simvastatin (VYTORIN) 10-80 MG per tablet Take 1 tablet by mouth at bedtime.    [provider]  FARXIGA 10 MG TABS tablet  12/19/18   [provider]  GAVILYTE-N WITH FLAVOR PACK 420 g solution  09/09/18   [provider]  HUMALOG MIX 75/25 KWIKPEN (75-25) 100 UNIT/ML Kwikpen  10/20/18   [provider]  hydrochlorothiazide (MICROZIDE) 12.5 MG capsule Take 12.5 mg by mouth daily.    [provider]  Insulin Glargine [facility]) 100 UNIT/ML SOPN  12/10/18   [provider]  insulin glargine (LANTUS SOLOSTAR) 100 UNIT/ML injection Inject 20 Units into the skin at bedtime.     [provider]  insulin lispro protamine-lispro (HUMALOG 75/25 MIX) (75-25) 100 UNIT/ML SUSP injection Inject 16 Units into the skin 3 (three) times daily.    [provider]  IRINOTECAN HCL IV Inject 360 mg into the vein every 14 (fourteen) days.    [provider]  JANUVIA 100 MG tablet  12/19/18   [provider]  LEUCOVORIN CALCIUM IV Inject 808 mg into the vein every 14 (fourteen) days.    [provider]  lidocaine-prilocaine (EMLA) cream Apply small amount over port site one hour prior to appointment. Cover with plastic wrap. 10/27/18   Derek Jack, MD  loperamide (IMODIUM A-D) 2 MG tablet Take 2 capsules after first loose stool and 1 capsule after each loose stool thereafter. 10/27/18   Derek Jack, MD  metFORMIN (GLUCOPHAGE) 1000 MG tablet Take 1,000 mg by mouth 2 (two) times daily.  02/01/15   [provider]  metoprolol succinate  (TOPROL-XL) 50 MG 24 hr tablet Take 75 mg by mouth 2 (two) times daily.    [provider]  niacin (NIASPAN) 500 MG CR tablet Take 500 mg by mouth at bedtime.    [provider]  NOVOLOG MIX 70/30 FLEXPEN (70-30) 100 UNIT/ML FlexPen  12/04/18   [provider]  Omega-3 Fatty Acids (FISH OIL PO) Take 1 capsule by mouth daily.    [provider]  pantoprazole (PROTONIX) 40 MG tablet Take 40 mg by mouth daily.    [provider]  prochlorperazine (COMPAZINE) 10 MG tablet Take 1 tablet (10 mg total) by mouth every 6 (six) hours as needed (NAUSEA). 10/27/18   Derek Jack, MD  ramipril (ALTACE) 10 MG capsule Take 10 mg by mouth daily.    [provider]  sodium chloride 0.9 % SOLN with fluorouracil 5 GM/100ML SOLN 200 mg/m2/day Inject 5,650 mg into the vein. Over 46 hours    [provider]     Allergies:    Allergies  Allergen Reactions  . Anectine [Succinylcholine] Other (See Comments)  Atypical pseudocholinesterase deficiency.     Social History:   Social History   Socioeconomic History  . Marital status: Married    Spouse name: Not on file  . Number of children: 3  . Years of education: Not on file  . Highest education level: Not on file  Occupational History  . 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 Financial resource strain: Not hard at all  . Food insecurity    Worry: Never true    Inability: Never true  . Transportation needs    Medical: No    Non-medical: No  Tobacco Use  . Smoking status: Never Smoker  . Smokeless tobacco: Never Used  Substance and Sexual Activity  . Alcohol use: No  . Drug use: No  . Sexual activity: Yes    Birth control/protection: None  Lifestyle  . Physical activity    Days per week: 0 days    Minutes per session: 0 min  . Stress: Not at all  Relationships  . Social connections    Talks on phone: More than three times a week     Gets together: More than three times a week    Attends religious service: Never    Active member of club or organization: No    Attends meetings of clubs or organizations: Never    Relationship status: Married  . Intimate partner violence    Fear of current or ex partner: No    Emotionally abused: No    Physically abused: No    Forced sexual activity: No  Other Topics Concern  . Not on file  Social History Narrative  . Not on file     Family History:  The patient's family history includes Diabetes in his brother, brother, father, sister, and sister; Heart disease in his brother and father; Hypertension in his brother, father, sister, and son; Lymphoma in his brother.     Review of Systems: [y] = yes, [ ]  = no   . General: Weight gain [ ] ; Weight loss [ ] ; Anorexia [ ] ; Fatigue [ ] ; Fever [ ] ; Chills [ ] ; Weakness [ ]   . Cardiac: Chest pain/pressure [Y]; Resting SOB [Y]; Exertional SOB [ ] ; Orthopnea [ ] ; Pedal Edema [ ] ; Palpitations [ ] ; Syncope [ ] ; Presyncope [ ] ; Paroxysmal nocturnal dyspnea[ ]   . Pulmonary: Cough [ ] ; Wheezing[ ] ; Hemoptysis[ ] ; Sputum [ ] ; Snoring [ ]   . GI: Vomiting[ ] ; Dysphagia[ ] ; Melena[ ] ; Hematochezia [ ] ; Heartburn[ ] ; Abdominal pain [ ] ; Constipation [ ] ; Diarrhea [ ] ; BRBPR [ ]   . GU: Hematuria[ ] ; Dysuria [ ] ; Nocturia[ ]   . Vascular: Pain in legs with walking [ ] ; Pain in feet with lying flat [ ] ; Non-healing sores [ ] ; Stroke [ ] ; TIA [ ] ; Slurred speech [ ] ;  . Neuro: Headaches[ ] ; Vertigo[ ] ; Seizures[ ] ; Paresthesias[ ] ;Blurred vision [ ] ; Diplopia [ ] ; Vision changes [ ]   . Ortho/Skin: Arthritis [ ] ; Joint pain [ ] ; Muscle pain [ ] ; Joint swelling [ ] ; Back Pain [ ] ; Rash [ ]   . Psych: Depression[ ] ; Anxiety[ ]   . Heme: Bleeding problems [ ] ; Clotting disorders [ ] ; Anemia [ ]   . Endocrine: Diabetes [ ] ; Thyroid dysfunction[ ]      Physical Exam/Data:   Vitals:   09/13/19 2311 09/13/19 2313 09/13/19 2330  BP: (!) 179/103 (!) 179/103 (!)  185/104  Pulse: (!) 114 (!) 115 Marland Kitchen)  115  Resp: 19 19 (!) 25  Temp:  97.7 F (36.5 C)   TempSrc:  Oral   SpO2: 98% 99% 97%  Weight:  89.8 kg   Height:  5\' 4"  (1.626 m)    No intake or output data in the 24 hours ending 09/14/19 0022 Filed Weights   09/13/19 2313  Weight: 89.8 kg   Body mass index is 33.99 kg/m.  General:  Well nourished, well developed, complains of 3/10 chest pain and in mild distress HEENT: normal Lymph: no adenopathy Neck: no JVD Endocrine:  No thryomegaly Vascular: No carotid bruits; FA pulses 2+ bilaterally without bruits  Cardiac:  normal S1, S2; RRR; no murmur Lungs:  clear to auscultation bilaterally, no wheezing, rhonchi or rales  Abd: soft, nontender, no hepatomegaly  Ext: no edema Musculoskeletal:  No deformities, BUE and BLE strength normal and equal Skin: warm and dry  Neuro:  CNs 2-12 intact, no focal abnormalities noted Psych:  Normal affect    Laboratory Data:  Chemistry Recent Labs  Lab 09/13/19 2318  NA 137  K 3.7  CL 102  CO2 20*  GLUCOSE 393*  BUN 21  CREATININE 1.34*  CALCIUM 9.2  GFRNONAA 56*  GFRAA >60  ANIONGAP 15    Recent Labs  Lab 09/13/19 2318  PROT 7.9  ALBUMIN 4.2  AST 25  ALT 28  ALKPHOS 94  BILITOT 0.5   Hematology Recent Labs  Lab 09/13/19 2318  WBC 8.3  RBC 4.78  HGB 13.8  HCT 43.2  MCV 90.4  MCH 28.9  MCHC 31.9  RDW 15.7*  PLT 261   Cardiac EnzymesNo results for input(s): TROPONINI in the last 168 hours. No results for input(s): TROPIPOC in the last 168 hours.  BNPNo results for input(s): BNP, PROBNP in the last 168 hours.  DDimer No results for input(s): DDIMER in the last 168 hours.  Radiology/Studies:  Dg Chest Port 1 View  Result Date: 09/13/2019 CLINICAL DATA:  Chest pain on the left EXAM: PORTABLE CHEST 1 VIEW COMPARISON:  10/10/2018 FINDINGS: Cardiac shadow is stable. Postsurgical changes are again seen. Left chest wall port is again noted. Lungs are well aerated bilaterally.  Minimal basilar atelectasis is seen. No confluent infiltrate is not no bony abnormality is seen. IMPRESSION: Minimal basilar atelectasis. Electronically Signed   By: Inez Catalina M.D.   On: 09/13/2019 23:32    Assessment and Plan:   1. ST elevation myocardial infarction  -Trend cardiac biomarkers -Serial ECGs -IV nitroglycerin infusion for chest pain -IV unfractionated heparin infusion -ASA daily -High dose statins -Keep NPO -Supplemental oxygen, IV morphine PRN -Consider emergent cardiac catheterization to define the coronary anatomy and to assess the patency of the bypass grafts.   Severity of Illness: The appropriate patient status for this patient is INPATIENT. Inpatient status is judged to be reasonable and necessary in order to provide the required intensity of service to ensure the patient's safety. The patient's presenting symptoms, physical exam findings, and initial radiographic and laboratory data in the context of their chronic comorbidities is felt to place them at high risk for further clinical deterioration. Furthermore, it is not anticipated that the patient will be medically stable for discharge from the hospital within 2 midnights of admission. The following factors support the patient status of inpatient.   " The patient's presenting symptoms include chest pain. " The worrisome physical exam findings include tachycardia. " The initial radiographic and laboratory data are worrisome because of elevated troponin. " The  chronic co-morbidities include hypertension and DM.   * I certify that at the point of admission it is my clinical judgment that the patient will require inpatient hospital care spanning beyond 2 midnights from the point of admission due to high intensity of service, high risk for further deterioration and high frequency of surveillance required.*    For questions or updates, please contact [place] Please consult [email or web address] for contact info  under Cardiology/STEMI.    Signed, Meade Maw, MD  09/14/2019 12:22 AM

## 2019-09-14 NOTE — Progress Notes (Signed)
Progress Note  Patient Name: Ian [name] Date of Encounter: 09/14/2019  Primary Cardiologist: No primary care provider on file.   Subjective   He is doing well this morning and denies chest pain or shortness of breath.  Inpatient Medications    Scheduled Meds: . amLODipine  10 mg Oral Daily  . aspirin EC  81 mg Oral Daily  . atorvastatin  40 mg Oral Daily  . Chlorhexidine Gluconate Cloth  6 each Topical Daily  . ezetimibe  10 mg Oral Daily  . heparin  5,000 Units Subcutaneous Q8H  . insulin aspart  0-15 Units Subcutaneous TID WC  . insulin glargine  20 Units Subcutaneous QHS  . metoprolol succinate  75 mg Oral BID  . pantoprazole  40 mg Oral Daily  . ramipril  10 mg Oral Daily  . sodium chloride flush  3 mL Intravenous Q12H  . ticagrelor  90 mg Oral BID   Continuous Infusions: . sodium chloride 10 mL/hr at 09/13/19 2329  . sodium chloride    . sodium chloride 1 mL/kg/hr (09/14/19 0600)  . nitroGLYCERIN 20 mcg/min (09/14/19 0700)   PRN Meds: sodium chloride, acetaminophen, hydrALAZINE, labetalol, morphine injection, nitroGLYCERIN, ondansetron (ZOFRAN) IV, oxyCODONE, prochlorperazine, sodium chloride flush   Vital Signs    Vitals:   09/14/19 0409 09/14/19 0500 09/14/19 0600 09/14/19 0700  BP: 124/76 133/80 133/74   Pulse: 85 86 84 92  Resp:   16 (!) 24  Temp: 97.6 F (36.4 C)     TempSrc: Oral     SpO2:  93% 96% 95%  Weight:      Height:        Intake/Output Summary (Last 24 hours) at 09/14/2019 0716 Last data filed at 09/14/2019 0700 Gross per 24 hour  Intake 53.3 ml  Output 350 ml  Net -296.7 ml   Filed Weights   09/13/19 2313 09/14/19 0200  Weight: 89.8 kg 87.6 kg    Telemetry    No arrhythmia- Personally Reviewed  ECG    Mnimal ST elevation in the septal leads, ST depression in the lateral leads which is actually improved from his EKG yesterday - Personally Reviewed  Physical Exam   GEN: No acute distress.   Cardiac:  Sinus rhythm,  soft flow murmur throughout all auscultatory sites, positive gallop Respiratory:  Minimal bibasilar rales  MS: No edema; No deformity. Neuro:  Nonfocal  Psych: Normal affect   Labs    Chemistry Recent Labs  Lab 09/13/19 2318 09/14/19 0243  NA 137 138  K 3.7 3.8  CL 102 98  CO2 20* 25  GLUCOSE 393* 351*  BUN 21 16  CREATININE 1.34* 1.27*  1.25*  CALCIUM 9.2 9.2  PROT 7.9  --   ALBUMIN 4.2  --   AST 25  --   ALT 28  --   ALKPHOS 94  --   BILITOT 0.5  --   GFRNONAA 56* >60  >60  GFRAA >60 >60  >60  ANIONGAP 15 15     Hematology Recent Labs  Lab 09/13/19 2318 09/14/19 0243  WBC 8.3 10.9*  RBC 4.78 4.56  HGB 13.8 13.6  HCT 43.2 40.6  MCV 90.4 89.0  MCH 28.9 29.8  MCHC 31.9 33.5  RDW 15.7* 15.5  PLT 261 273    Cardiac EnzymesNo results for input(s): TROPONINI in the last 168 hours. No results for input(s): TROPIPOC in the last 168 hours.   BNP Recent Labs  Lab 09/14/19 0243  BNP 141.9*     DDimer No results for input(s): DDIMER in the last 168 hours.   Radiology    Dg Chest Port 1 View  Result Date: 09/13/2019 CLINICAL DATA:  Chest pain on the left EXAM: PORTABLE CHEST 1 VIEW COMPARISON:  10/10/2018 FINDINGS: Cardiac shadow is stable. Postsurgical changes are again seen. Left chest wall port is again noted. Lungs are well aerated bilaterally. Minimal basilar atelectasis is seen. No confluent infiltrate is not no bony abnormality is seen. IMPRESSION: Minimal basilar atelectasis. Electronically Signed   By: Inez Catalina M.D.   On: 09/13/2019 23:32    Cardiac Studies   09/14/2019-L[facility]   Prox RCA to Mid RCA lesion is 100% stenosed.  Ost LAD to Prox LAD lesion is 100% stenosed.  Mid LM to Dist LM lesion is 95% stenosed.  Prox Cx to Mid Cx lesion is 90% stenosed.  Mid Cx to Dist Cx lesion is 90% stenosed.  3rd Mrg lesion is 100% stenosed.  Left radial artery.  Prox Graft lesion is 95% stenosed.  Mid Graft to Dist Graft lesion is 99%  stenosed.  Origin to Prox Graft lesion is 100% stenosed.  Origin to Prox Graft lesion is 100% stenosed.  LIMA and is large.  A drug-eluting stent was successfully placed using a STENT RESOLUTE ONYX [id number].  Post intervention, there is a 0% residual stenosis.  A drug-eluting stent was successfully placed using a STENT RESOLUTE ONYX [id number].  Post intervention, there is a 0% residual stenosis.   1.  Severe three-vessel coronary artery disease with total occlusion of the RCA, total occlusion of the LAD, severe stenosis of the left main, and severe stenosis of the left circumflex 2.  Status post aortocoronary bypass surgery with chronic occlusion of the saphenous vein graft to OM1, saphenous vein graft to OM 2, continued patency of the LIMA to LAD, and continued patency of the left radial graft to PDA with severe sequential stenoses in the proximal and mid body of the graft. 3.  Successful PCI of the left radial artery to PDA graft using a 2.75 x 22 mm resolute Onyx DES in the mid body of the graft and a 2.75 x 15 mm resolute Onyx DES in the proximal body of the graft  Recommendations: Dual antiplatelet therapy with aspirin and ticagrelor at least 12 months without interruption, aggressive guideline directed medical therapy, staged PCI of the left main and left circumflex which is no longer protected because of saphenous vein graft occlusion.  The left main is heavily calcified and I think the patient will require atherectomy to facilitate stent expansion through this area.  If he has no procedural/post MI complications, would tentatively proceed on Tuesday.  Patient Profile     Ian [name] is a 63 y.o. male with a past medical history significant for 4-vessel CABG, metastatic sigmoid colon cancer to the liver (with partial colectomy and wedge resection of the liver), diabetes mellitus, gastroesophageal reflux disease, hypertension and peripheral neuropathy who presented with left-sided  chest pain found to have ST elevation myocardial infarction  Assessment & Plan   #ST elevation MI #CAD status post prior CABG x4 #Severe three-vessel disease He is now status post LHC which revealed severe three-vessel disease PCI of left radial artery to PDA.  He was noted to have severely calcified left main and would benefit from atherectomy.  He did have minimal rales bibasilarly and will give a one-time dose of p.o. Lasix 40 mg. -Continue aspirin, Brilinta, metoprolol, Lipitor,  Zetia -If he remains stable, can proceed with atherectomy of severely calcified left main -Follow-up echocardiogram  #Insulin-dependent diabetes mellitus A1c of 12% -Continue insulin -Home medications include Metformin, Farxiga, NovoLog 74/30, Basaglar, Humalog 75-25, Januvia  #Hyperlipidemia -Continue Zetia, Lipitor  #Hypertension He is currently on metoprolol succinate 75 mg BID (HR in the 90s).  We will keep a close eye on his hemodynamic status and if necessary, will titrate down his beta-blocker -Continue amlodipine, ramipril -As needed hydralazine  #AKI - improving  sCr 1.2<<1.3 (Baseline 1) -Continue to monitor  For questions or updates, please contact [facility] Please consult [email or web address] for contact info under Cardiology/STEMI.      Signed, Jean Rosenthal, MD  09/14/2019, 7:16 AM

## 2019-09-14 NOTE — Progress Notes (Signed)
[id number] MI education completed with pt and wife who voiced understanding. Stressed importance of brilinta with stents. Reviewed NTG use, MI restrictions, carb counting and heart healthy food choices, walking for ex, and CRP 2. Referred to [facility] CRP 2.  Discussed A1C of 12. Pt stated he was to chill today so did not walk but will ambulate after staged PCI. Graylon Good RN BSN 09/14/2019 2:29 PM

## 2019-09-14 NOTE — Progress Notes (Addendum)
Inpatient Diabetes Program Recommendations  AACE/ADA: New Consensus Statement on Inpatient Glycemic Control (2015)  Target Ranges:  Prepandial:   less than 140 mg/dL      Peak postprandial:   less than 180 mg/dL (1-2 hours)      Critically ill patients:  140 - 180 mg/dL   Lab Results  Component Value Date   GLUCAP 311 (H) 09/14/2019   HGBA1C 12.0 (H) 09/14/2019    Review of Glycemic Control Results for CORTAVIUS, MONTESINOS (MRN [id number]) as of 09/14/2019 11:32  Ref. Range 09/14/2019 03:19 09/14/2019 06:51 09/14/2019 11:20  Glucose-Capillary Latest Ref Range: 70 - 99 mg/dL 356 (H) 327 (H) 311 (H)   Diabetes history: DM 2 Outpatient Diabetes medications:  Lantus 20 units q HS, Januvia 100 mg daily, 75/25- 16 units tid with meals  Current orders for Inpatient glycemic control:  Lantus 20 units q HS, Novolog moderate tid with meals  Inpatient Diabetes Program Recommendations:    Note elevated A1C=12%.  Please consider adding Novolog meal coverage 4 units tid with meals (hold if patient eats less than 50%).  Will see patient when appropriate to discuss A1C and home insulin therapy.   Thanks  Adah Perl, RN, BC-ADM Inpatient Diabetes Coordinator Pager [phone] (8a-5p)

## 2019-09-14 NOTE — Care Management (Signed)
Brilinta benefits check sent and pending.  Rushil Kimbrell RN, BSN, NCM-BC, ACM-RN [phone] 

## 2019-09-14 NOTE — TOC Benefit Eligibility Note (Signed)
Transition of Care [facility]) Benefit Eligibility Note    Patient Details  Name: NIKOLA MARONE MRN: [id number] Date of Birth: October 17, 1956   Medication/Dose: Brilinta/Ticagrelor 75m twice daily  Covered?: Yes  Tier: 2 Drug  Prescription Coverage [facility]: [facility] and [facility]  Spoke with Person/Company/Phone Number:: Theresa/ [facility]/ [phone] Co-Pay: 20.00 for a 30 day supply retail  and 40.00 for a 90 day supply mail order  Prior Approval: No  Deductible: Met       IOrbie PyoPhone Number: 09/14/2019, 3:10 PM

## 2019-09-14 NOTE — Progress Notes (Signed)
  Echocardiogram 2D Echocardiogram has been performed.  Ian [name] 09/14/2019, 11:51 AM

## 2019-09-14 NOTE — H&P (View-Only) (Signed)
Progress Note  Patient Name: Ian [name] Date of Encounter: 09/14/2019  Primary Cardiologist: No primary care provider on file.   Subjective   He is doing well this morning and denies chest pain or shortness of breath.  Inpatient Medications    Scheduled Meds: . amLODipine  10 mg Oral Daily  . aspirin EC  81 mg Oral Daily  . atorvastatin  40 mg Oral Daily  . Chlorhexidine Gluconate Cloth  6 each Topical Daily  . ezetimibe  10 mg Oral Daily  . heparin  5,000 Units Subcutaneous Q8H  . insulin aspart  0-15 Units Subcutaneous TID WC  . insulin glargine  20 Units Subcutaneous QHS  . metoprolol succinate  75 mg Oral BID  . pantoprazole  40 mg Oral Daily  . ramipril  10 mg Oral Daily  . sodium chloride flush  3 mL Intravenous Q12H  . ticagrelor  90 mg Oral BID   Continuous Infusions: . sodium chloride 10 mL/hr at 09/13/19 2329  . sodium chloride    . sodium chloride 1 mL/kg/hr (09/14/19 0600)  . nitroGLYCERIN 20 mcg/min (09/14/19 0700)   PRN Meds: sodium chloride, acetaminophen, hydrALAZINE, labetalol, morphine injection, nitroGLYCERIN, ondansetron (ZOFRAN) IV, oxyCODONE, prochlorperazine, sodium chloride flush   Vital Signs    Vitals:   09/14/19 0409 09/14/19 0500 09/14/19 0600 09/14/19 0700  BP: 124/76 133/80 133/74   Pulse: 85 86 84 92  Resp:   16 (!) 24  Temp: 97.6 F (36.4 C)     TempSrc: Oral     SpO2:  93% 96% 95%  Weight:      Height:        Intake/Output Summary (Last 24 hours) at 09/14/2019 0716 Last data filed at 09/14/2019 0700 Gross per 24 hour  Intake 53.3 ml  Output 350 ml  Net -296.7 ml   Filed Weights   09/13/19 2313 09/14/19 0200  Weight: 89.8 kg 87.6 kg    Telemetry    No arrhythmia- Personally Reviewed  ECG    Mnimal ST elevation in the septal leads, ST depression in the lateral leads which is actually improved from his EKG yesterday - Personally Reviewed  Physical Exam   GEN: No acute distress.   Cardiac:  Sinus rhythm,  soft flow murmur throughout all auscultatory sites, positive gallop Respiratory:  Minimal bibasilar rales  MS: No edema; No deformity. Neuro:  Nonfocal  Psych: Normal affect   Labs    Chemistry Recent Labs  Lab 09/13/19 2318 09/14/19 0243  NA 137 138  K 3.7 3.8  CL 102 98  CO2 20* 25  GLUCOSE 393* 351*  BUN 21 16  CREATININE 1.34* 1.27*  1.25*  CALCIUM 9.2 9.2  PROT 7.9  --   ALBUMIN 4.2  --   AST 25  --   ALT 28  --   ALKPHOS 94  --   BILITOT 0.5  --   GFRNONAA 56* >60  >60  GFRAA >60 >60  >60  ANIONGAP 15 15     Hematology Recent Labs  Lab 09/13/19 2318 09/14/19 0243  WBC 8.3 10.9*  RBC 4.78 4.56  HGB 13.8 13.6  HCT 43.2 40.6  MCV 90.4 89.0  MCH 28.9 29.8  MCHC 31.9 33.5  RDW 15.7* 15.5  PLT 261 273    Cardiac EnzymesNo results for input(s): TROPONINI in the last 168 hours. No results for input(s): TROPIPOC in the last 168 hours.   BNP Recent Labs  Lab 09/14/19 0243  BNP 141.9*     DDimer No results for input(s): DDIMER in the last 168 hours.   Radiology    Dg Chest Port 1 View  Result Date: 09/13/2019 CLINICAL DATA:  Chest pain on the left EXAM: PORTABLE CHEST 1 VIEW COMPARISON:  10/10/2018 FINDINGS: Cardiac shadow is stable. Postsurgical changes are again seen. Left chest wall port is again noted. Lungs are well aerated bilaterally. Minimal basilar atelectasis is seen. No confluent infiltrate is not no bony abnormality is seen. IMPRESSION: Minimal basilar atelectasis. Electronically Signed   By: Inez Catalina M.D.   On: 09/13/2019 23:32    Cardiac Studies   09/14/2019-LHC   Prox RCA to Mid RCA lesion is 100% stenosed.  Ost LAD to Prox LAD lesion is 100% stenosed.  Mid LM to Dist LM lesion is 95% stenosed.  Prox Cx to Mid Cx lesion is 90% stenosed.  Mid Cx to Dist Cx lesion is 90% stenosed.  3rd Mrg lesion is 100% stenosed.  Left radial artery.  Prox Graft lesion is 95% stenosed.  Mid Graft to Dist Graft lesion is 99%  stenosed.  Origin to Prox Graft lesion is 100% stenosed.  Origin to Prox Graft lesion is 100% stenosed.  LIMA and is large.  A drug-eluting stent was successfully placed using a STENT RESOLUTE ONYX [id number].  Post intervention, there is a 0% residual stenosis.  A drug-eluting stent was successfully placed using a STENT RESOLUTE ONYX [id number].  Post intervention, there is a 0% residual stenosis.   1.  Severe three-vessel coronary artery disease with total occlusion of the RCA, total occlusion of the LAD, severe stenosis of the left main, and severe stenosis of the left circumflex 2.  Status post aortocoronary bypass surgery with chronic occlusion of the saphenous vein graft to OM1, saphenous vein graft to OM 2, continued patency of the LIMA to LAD, and continued patency of the left radial graft to PDA with severe sequential stenoses in the proximal and mid body of the graft. 3.  Successful PCI of the left radial artery to PDA graft using a 2.75 x 22 mm resolute Onyx DES in the mid body of the graft and a 2.75 x 15 mm resolute Onyx DES in the proximal body of the graft  Recommendations: Dual antiplatelet therapy with aspirin and ticagrelor at least 12 months without interruption, aggressive guideline directed medical therapy, staged PCI of the left main and left circumflex which is no longer protected because of saphenous vein graft occlusion.  The left main is heavily calcified and I think the patient will require atherectomy to facilitate stent expansion through this area.  If he has no procedural/post MI complications, would tentatively proceed on Tuesday.  Patient Profile     Ian [name] is a 63 y.o. male with a past medical history significant for 4-vessel CABG, metastatic sigmoid colon cancer to the liver (with partial colectomy and wedge resection of the liver), diabetes mellitus, gastroesophageal reflux disease, hypertension and peripheral neuropathy who presented with left-sided  chest pain found to have ST elevation myocardial infarction  Assessment & Plan   #ST elevation MI #CAD status post prior CABG x4 #Severe three-vessel disease He is now status post LHC which revealed severe three-vessel disease PCI of left radial artery to PDA.  He was noted to have severely calcified left main and would benefit from atherectomy.  He did have minimal rales bibasilarly and will give a one-time dose of p.o. Lasix 40 mg. -Continue aspirin, Brilinta, metoprolol, Lipitor,  Zetia -If he remains stable, can proceed with atherectomy of severely calcified left main -Follow-up echocardiogram  #Insulin-dependent diabetes mellitus A1c of 12% -Continue insulin -Home medications include Metformin, Farxiga, NovoLog 74/30, Basaglar, Humalog 75-25, Januvia  #Hyperlipidemia -Continue Zetia, Lipitor  #Hypertension He is currently on metoprolol succinate 75 mg BID (HR in the 90s).  We will keep a close eye on his hemodynamic status and if necessary, will titrate down his beta-blocker -Continue amlodipine, ramipril -As needed hydralazine  #AKI - improving  sCr 1.2<<1.3 (Baseline 1) -Continue to monitor  For questions or updates, please contact [facility] Please consult [email or web address] for contact info under Cardiology/STEMI.      Signed, Jean Rosenthal, MD  09/14/2019, 7:16 AM

## 2019-09-14 NOTE — Plan of Care (Signed)
  Problem: Education: Goal: Knowledge of General Education information will improve Description: Including pain rating scale, medication(s)/side effects and non-pharmacologic comfort measures Outcome: Progressing   Problem: Clinical Measurements: Goal: Ability to maintain clinical measurements within normal limits will improve Outcome: Progressing Goal: Will remain free from infection Outcome: Progressing Goal: Respiratory complications will improve Outcome: Progressing   Problem: Nutrition: Goal: Adequate nutrition will be maintained Outcome: Progressing   Problem: Coping: Goal: Level of anxiety will decrease Outcome: Progressing   Problem: Elimination: Goal: Will not experience complications related to urinary retention Outcome: Progressing   Problem: Pain Managment: Goal: General experience of comfort will improve Outcome: Progressing   Problem: Safety: Goal: Ability to remain free from injury will improve Outcome: Progressing   Problem: Skin Integrity: Goal: Risk for impaired skin integrity will decrease Outcome: Progressing   Problem: Education: Goal: Understanding of cardiac disease, CV risk reduction, and recovery process will improve Outcome: Progressing   Problem: Cardiac: Goal: Ability to achieve and maintain adequate cardiopulmonary perfusion will improve Outcome: Progressing Goal: Vascular access site(s) Level 0-1 will be maintained Outcome: Progressing   Problem: Clinical Measurements: Goal: Diagnostic test results will improve Outcome: Not Progressing

## 2019-09-15 ENCOUNTER — Encounter (HOSPITAL_COMMUNITY): Payer: Self-pay | Admitting: Cardiovascular Disease

## 2019-09-15 ENCOUNTER — Encounter (HOSPITAL_COMMUNITY)
Admission: EM | Disposition: A | Discharge status: Home / Self Care | Payer: Self-pay | Source: Home / Self Care | Attending: Cardiovascular Disease

## 2019-09-15 DIAGNOSIS — E1165 Type 2 diabetes mellitus with hyperglycemia: Secondary | ICD-10-CM

## 2019-09-15 DIAGNOSIS — E1169 Type 2 diabetes mellitus with other specified complication: Secondary | ICD-10-CM

## 2019-09-15 DIAGNOSIS — Z955 Presence of coronary angioplasty implant and graft: Secondary | ICD-10-CM

## 2019-09-15 DIAGNOSIS — E785 Hyperlipidemia, unspecified: Secondary | ICD-10-CM

## 2019-09-15 DIAGNOSIS — I214 Non-ST elevation (NSTEMI) myocardial infarction: Principal | ICD-10-CM

## 2019-09-15 DIAGNOSIS — E1151 Type 2 diabetes mellitus with diabetic peripheral angiopathy without gangrene: Secondary | ICD-10-CM

## 2019-09-15 DIAGNOSIS — I25110 Atherosclerotic heart disease of native coronary artery with unstable angina pectoris: Secondary | ICD-10-CM

## 2019-09-15 DIAGNOSIS — I10 Essential (primary) hypertension: Secondary | ICD-10-CM

## 2019-09-15 HISTORY — PX: CORONARY STENT INTERVENTION: CATH118234

## 2019-09-15 HISTORY — PX: CORONARY ATHERECTOMY: CATH118238

## 2019-09-15 LAB — CBC
HCT: 35.2 % — ABNORMAL LOW (ref 39.0–52.0)
Hemoglobin: 11.6 g/dL — ABNORMAL LOW (ref 13.0–17.0)
MCH: 29.3 pg (ref 26.0–34.0)
MCHC: 33 g/dL (ref 30.0–36.0)
MCV: 88.9 fL (ref 80.0–100.0)
Platelets: 231 10*3/uL (ref 150–400)
RBC: 3.96 MIL/uL — ABNORMAL LOW (ref 4.22–5.81)
RDW: 15.2 % (ref 11.5–15.5)
WBC: 8.8 10*3/uL (ref 4.0–10.5)
nRBC: 0 % (ref 0.0–0.2)

## 2019-09-15 LAB — GLUCOSE, CAPILLARY
Glucose-Capillary: 225 mg/dL — ABNORMAL HIGH (ref 70–99)
Glucose-Capillary: 190 mg/dL — ABNORMAL HIGH (ref 70–99)
Glucose-Capillary: 190 mg/dL — ABNORMAL HIGH (ref 70–99)
Glucose-Capillary: 178 mg/dL — ABNORMAL HIGH (ref 70–99)

## 2019-09-15 LAB — BASIC METABOLIC PANEL
Anion gap: 14 (ref 5–15)
BUN: 14 mg/dL (ref 8–23)
CO2: 21 mmol/L — ABNORMAL LOW (ref 22–32)
Calcium: 9.1 mg/dL (ref 8.9–10.3)
Chloride: 104 mmol/L (ref 98–111)
Creatinine, Ser: 1 mg/dL (ref 0.61–1.24)
GFR calc Af Amer: >60 mL/min (ref >60)
GFR calc non Af Amer: >60 mL/min (ref >60)
Glucose, Bld: 203 mg/dL — ABNORMAL HIGH (ref 70–99)
Potassium: 3.4 mmol/L — ABNORMAL LOW (ref 3.5–5.1)
Sodium: 139 mmol/L (ref 135–145)

## 2019-09-15 LAB — POCT ACTIVATED CLOTTING TIME
Activated Clotting Time: 362 seconds
Activated Clotting Time: 296 seconds

## 2019-09-15 SURGERY — CORONARY STENT INTERVENTION
Anesthesia: LOCAL

## 2019-09-15 MED ORDER — METOPROLOL SUCCINATE ER 50 MG PO TB24
100.0000 mg | ORAL_TABLET | Freq: Two times a day (BID) | ORAL | Status: DC
  Administered 2019-09-15 – 2019-09-16 (×3): 100 mg via ORAL
  Filled 2019-09-15 (×3): qty 2

## 2019-09-15 MED ORDER — LIDOCAINE HCL (PF) 1 % IJ SOLN
INTRAMUSCULAR | Status: AC
  Filled 2019-09-15: qty 30

## 2019-09-15 MED ORDER — INSULIN ASPART 100 UNIT/ML ~~LOC~~ SOLN
4.0000 [IU] | Freq: Three times a day (TID) | SUBCUTANEOUS | Status: DC
  Administered 2019-09-15 – 2019-09-16 (×3): 4 [IU] via SUBCUTANEOUS

## 2019-09-15 MED ORDER — IOHEXOL 350 MG/ML SOLN
INTRAVENOUS | Status: DC | PRN
  Administered 2019-09-15: 105 mL via INTRA_ARTERIAL

## 2019-09-15 MED ORDER — FUROSEMIDE 10 MG/ML IJ SOLN
20.0000 mg | Freq: Once | INTRAMUSCULAR | Status: AC
  Administered 2019-09-15: 20 mg via INTRAVENOUS
  Filled 2019-09-15: qty 2

## 2019-09-15 MED ORDER — METOPROLOL TARTRATE 5 MG/5ML IV SOLN
INTRAVENOUS | Status: AC
  Filled 2019-09-15: qty 5

## 2019-09-15 MED ORDER — POTASSIUM CHLORIDE CRYS ER 20 MEQ PO TBCR
40.0000 meq | EXTENDED_RELEASE_TABLET | Freq: Once | ORAL | Status: AC
  Administered 2019-09-15: 14:00:00 40 meq via ORAL
  Filled 2019-09-15: qty 2

## 2019-09-15 MED ORDER — NITROGLYCERIN 1 MG/10 ML FOR IR/CATH LAB
INTRA_ARTERIAL | Status: DC | PRN
  Administered 2019-09-15 (×4): 200 ug via INTRACORONARY

## 2019-09-15 MED ORDER — VIPERSLIDE LUBRICANT OPTIME
TOPICAL | Status: DC | PRN
  Administered 2019-09-15: 500 mL via SURGICAL_CAVITY

## 2019-09-15 MED ORDER — LABETALOL HCL 5 MG/ML IV SOLN
5.0000 mg | INTRAVENOUS | Status: DC | PRN
  Administered 2019-09-16: 5 mg via INTRAVENOUS
  Filled 2019-09-15: qty 4

## 2019-09-15 MED ORDER — HEPARIN (PORCINE) IN NACL 1000-0.9 UT/500ML-% IV SOLN
INTRAVENOUS | Status: AC
  Filled 2019-09-15: qty 1000

## 2019-09-15 MED ORDER — VERAPAMIL HCL 2.5 MG/ML IV SOLN
INTRAVENOUS | Status: AC
  Filled 2019-09-15: qty 2

## 2019-09-15 MED ORDER — VERAPAMIL HCL 2.5 MG/ML IV SOLN
INTRAVENOUS | Status: DC | PRN
  Administered 2019-09-15: 10 mL via INTRA_ARTERIAL

## 2019-09-15 MED ORDER — INSULIN GLARGINE 100 UNIT/ML ~~LOC~~ SOLN
30.0000 [IU] | Freq: Every day | SUBCUTANEOUS | Status: DC
  Administered 2019-09-15: 30 [IU] via SUBCUTANEOUS
  Filled 2019-09-15 (×2): qty 0.3

## 2019-09-15 MED ORDER — MIDAZOLAM HCL 2 MG/2ML IJ SOLN
INTRAMUSCULAR | Status: AC
  Filled 2019-09-15: qty 2

## 2019-09-15 MED ORDER — HEPARIN SODIUM (PORCINE) 1000 UNIT/ML IJ SOLN
INTRAMUSCULAR | Status: AC
  Filled 2019-09-15: qty 1

## 2019-09-15 MED ORDER — MIDAZOLAM HCL 2 MG/2ML IJ SOLN
INTRAMUSCULAR | Status: DC | PRN
  Administered 2019-09-15 (×2): 1 mg via INTRAVENOUS

## 2019-09-15 MED ORDER — FENTANYL CITRATE (PF) 100 MCG/2ML IJ SOLN
INTRAMUSCULAR | Status: AC
  Filled 2019-09-15: qty 2

## 2019-09-15 MED ORDER — HEPARIN SODIUM (PORCINE) 1000 UNIT/ML IJ SOLN
INTRAMUSCULAR | Status: DC | PRN
  Administered 2019-09-15: 9000 [IU] via INTRAVENOUS

## 2019-09-15 MED ORDER — INFLUENZA VAC SPLIT QUAD 0.5 ML IM SUSY
0.5000 mL | PREFILLED_SYRINGE | INTRAMUSCULAR | Status: AC | PRN
  Administered 2019-09-16: 0.5 mL via INTRAMUSCULAR
  Filled 2019-09-15 (×2): qty 0.5

## 2019-09-15 MED ORDER — METOPROLOL SUCCINATE ER 50 MG PO TB24: 100.0000 mg | ORAL_TABLET | Freq: Two times a day (BID) | ORAL | Status: DC

## 2019-09-15 MED ORDER — FENTANYL CITRATE (PF) 100 MCG/2ML IJ SOLN
INTRAMUSCULAR | Status: DC | PRN
  Administered 2019-09-15 (×3): 25 ug via INTRAVENOUS

## 2019-09-15 MED ORDER — NITROGLYCERIN 1 MG/10 ML FOR IR/CATH LAB
INTRA_ARTERIAL | Status: AC
  Filled 2019-09-15: qty 10

## 2019-09-15 MED ORDER — LIDOCAINE HCL (PF) 1 % IJ SOLN
INTRAMUSCULAR | Status: DC | PRN
  Administered 2019-09-15: 2 mL

## 2019-09-15 MED ORDER — LORAZEPAM 2 MG/ML IJ SOLN
0.5000 mg | Freq: Once | INTRAMUSCULAR | Status: AC
  Administered 2019-09-15: 0.5 mg via INTRAVENOUS
  Filled 2019-09-15: qty 1

## 2019-09-15 MED ORDER — HEPARIN (PORCINE) IN NACL 1000-0.9 UT/500ML-% IV SOLN
INTRAVENOUS | Status: DC | PRN
  Administered 2019-09-15 (×2): 500 mL

## 2019-09-15 MED FILL — Verapamil HCl IV Soln 2.5 MG/ML: INTRAVENOUS | Qty: 2 | Status: AC

## 2019-09-15 MED FILL — Heparin Sod (Porcine)-NaCl IV Soln 1000 Unit/500ML-0.9%: INTRAVENOUS | Qty: 1000 | Status: AC

## 2019-09-15 SURGICAL SUPPLY — 23 items
BALLN SAPPHIRE 2.5X15 (BALLOONS) ×2
BALLN SAPPHIRE ~~LOC~~ 3.5X18 (BALLOONS) ×1 IMPLANT
BALLN ~~LOC~~ EMERGE MR 3.0X20 (BALLOONS) ×2
BALLOON SAPPHIRE 2.5X15 (BALLOONS) IMPLANT
BALLOON ~~LOC~~ EMERGE MR 3.0X20 (BALLOONS) IMPLANT
CATH INFINITI 5FR ANG PIGTAIL (CATHETERS) ×1 IMPLANT
CATH LAUNCHER 6FR EBU3.5 (CATHETERS) ×1 IMPLANT
CROWN DIAMONDBACK CLASSIC 1.25 (BURR) ×1 IMPLANT
DEVICE RAD COMP TR BAND LRG (VASCULAR PRODUCTS) ×1 IMPLANT
ELECT DEFIB PAD ADLT CADENCE (PAD) ×1 IMPLANT
GLIDESHEATH SLEND SS 6F .021 (SHEATH) ×1 IMPLANT
GUIDEWIRE INQWIRE 1.5J.035X260 (WIRE) IMPLANT
INQWIRE 1.5J .035X260CM (WIRE) ×2
KIT ENCORE 26 ADVANTAGE (KITS) ×1 IMPLANT
KIT HEART LEFT (KITS) ×2 IMPLANT
LUBRICANT VIPERSLIDE CORONARY (MISCELLANEOUS) ×1 IMPLANT
PACK CARDIAC CATHETERIZATION (CUSTOM PROCEDURE TRAY) ×2 IMPLANT
STENT RESOLUTE ONYX 2.5X34 (Permanent Stent) ×1 IMPLANT
STENT RESOLUTE ONYX 3.0X30 (Permanent Stent) ×1 IMPLANT
TRANSDUCER W/STOPCOCK (MISCELLANEOUS) ×2 IMPLANT
TUBING CIL FLEX 10 FLL-RA (TUBING) ×2 IMPLANT
WIRE COUGAR XT STRL 190CM (WIRE) ×1 IMPLANT
WIRE VIPERWIRE COR FLEX .012 (WIRE) ×1 IMPLANT

## 2019-09-15 NOTE — Progress Notes (Addendum)
Progress Note  Patient Name: Ian [name] Date of Encounter: 09/15/2019  Primary Cardiologist: No primary care provider on file.   Subjective   He reports of orthopnea and PND yesterday which required for him to prop his bed up to sleep.  He attributed this to the Brilinta dose that he had received.  He denies chest pain or shortness of breath.  He appeared very anxious about his cardiac procedure today and had a lot of questions which were addressed appropriately.  Inpatient Medications    Scheduled Meds: . amLODipine  10 mg Oral Daily  . aspirin EC  81 mg Oral Daily  . atorvastatin  80 mg Oral Daily  . Chlorhexidine Gluconate Cloth  6 each Topical Daily  . ezetimibe  10 mg Oral Daily  . heparin  5,000 Units Subcutaneous Q8H  . insulin aspart  0-15 Units Subcutaneous TID WC  . insulin glargine  20 Units Subcutaneous QHS  . metoprolol succinate  75 mg Oral BID  . pantoprazole  40 mg Oral Daily  . ramipril  10 mg Oral Daily  . sodium chloride flush  3 mL Intravenous Q12H  . ticagrelor  90 mg Oral BID   Continuous Infusions: . sodium chloride 10 mL/hr at 09/13/19 2329  . sodium chloride    . sodium chloride    . sodium chloride 1 mL/kg/hr (09/15/19 0610)  . nitroGLYCERIN 20 mcg/min (09/15/19 0200)   PRN Meds: sodium chloride, sodium chloride, acetaminophen, morphine injection, nitroGLYCERIN, ondansetron (ZOFRAN) IV, oxyCODONE, prochlorperazine, sodium chloride flush   Vital Signs    Vitals:   09/15/19 0000 09/15/19 0100 09/15/19 0200 09/15/19 0401  BP: 128/78 (!) 151/68 (!) 151/84   Pulse: 96 100 93   Resp: 18 17 19    Temp:    98 F (36.7 C)  TempSrc:    Oral  SpO2: 93% 96% 94%   Weight:      Height:        Intake/Output Summary (Last 24 hours) at 09/15/2019 0636 Last data filed at 09/15/2019 0406 Gross per 24 hour  Intake 689.77 ml  Output 1200 ml  Net -510.23 ml   Filed Weights   09/13/19 2313 09/14/19 0200  Weight: 89.8 kg 87.6 kg    Telemetry     No arrhythmia- Personally Reviewed  ECG    None today- Personally Reviewed  Physical Exam   GEN: No acute distress.   Neck: No JVD Cardiac: RRR, no murmurs, rubs, or gallops.  Respiratory:  Mild bibasilar crackles MS: No edema; No deformity. Neuro:  Nonfocal  Psych: Normal affect   Labs    Chemistry Recent Labs  Lab 09/13/19 2318 09/14/19 0243  NA 137 138  K 3.7 3.8  CL 102 98  CO2 20* 25  GLUCOSE 393* 351*  BUN 21 16  CREATININE 1.34* 1.27*  1.25*  CALCIUM 9.2 9.2  PROT 7.9  --   ALBUMIN 4.2  --   AST 25  --   ALT 28  --   ALKPHOS 94  --   BILITOT 0.5  --   GFRNONAA 56* >60  >60  GFRAA >60 >60  >60  ANIONGAP 15 15     Hematology Recent Labs  Lab 09/13/19 2318 09/14/19 0243  WBC 8.3 10.9*  RBC 4.78 4.56  HGB 13.8 13.6  HCT 43.2 40.6  MCV 90.4 89.0  MCH 28.9 29.8  MCHC 31.9 33.5  RDW 15.7* 15.5  PLT 261 273    Cardiac EnzymesNo  results for input(s): TROPONINI in the last 168 hours. No results for input(s): TROPIPOC in the last 168 hours.   BNP Recent Labs  Lab 09/14/19 0243  BNP 141.9*     DDimer No results for input(s): DDIMER in the last 168 hours.   Radiology    Dg Chest Port 1 View  Result Date: 09/13/2019 CLINICAL DATA:  Chest pain on the left EXAM: PORTABLE CHEST 1 VIEW COMPARISON:  10/10/2018 FINDINGS: Cardiac shadow is stable. Postsurgical changes are again seen. Left chest wall port is again noted. Lungs are well aerated bilaterally. Minimal basilar atelectasis is seen. No confluent infiltrate is not no bony abnormality is seen. IMPRESSION: Minimal basilar atelectasis. Electronically Signed   By: Inez Catalina M.D.   On: 09/13/2019 23:32    Cardiac Studies   09/14/2019-LHC   Prox RCA to Mid RCA lesion is 100% stenosed.  Ost LAD to Prox LAD lesion is 100% stenosed.  Mid LM to Dist LM lesion is 95% stenosed.  Prox Cx to Mid Cx lesion is 90% stenosed.  Mid Cx to Dist Cx lesion is 90% stenosed.  3rd Mrg lesion is  100% stenosed.  Left radial artery.  Prox Graft lesion is 95% stenosed.  Mid Graft to Dist Graft lesion is 99% stenosed.  Origin to Prox Graft lesion is 100% stenosed.  Origin to Prox Graft lesion is 100% stenosed.  LIMA and is large.  A drug-eluting stent was successfully placed using a STENT RESOLUTE ONYX [id number].  Post intervention, there is a 0% residual stenosis.  A drug-eluting stent was successfully placed using a STENT RESOLUTE ONYX [id number].  Post intervention, there is a 0% residual stenosis.  1. Severe three-vessel coronary artery disease with total occlusion of the RCA, total occlusion of the LAD, severe stenosis of the left main, and severe stenosis of the left circumflex 2. Status post aortocoronary bypass surgery with chronic occlusion of the saphenous vein graft to OM1, saphenous vein graft to OM 2, continued patency of the LIMA to LAD, and continued patency of the left radial graft to PDA with severe sequential stenoses in the proximal and mid body of the graft. 3. Successful PCI of the left radial artery to PDA graft using a 2.75 x 22 mm resolute Onyx DES in the mid body of the graft and a 2.75 x 15 mm resolute Onyx DES in the proximal body of the graft  Recommendations: Dual antiplatelet therapy with aspirin and ticagrelor at least 12 months without interruption, aggressive guideline directed medical therapy, staged PCI of the left main and left circumflex which is no longer protected because of saphenous vein graft occlusion. The left main is heavily calcified and I think the patient will require atherectomy to facilitate stent expansion through this area. If he has no procedural/post MI complications, would tentatively proceed on TODAY.  Patient Profile     Ian [name] a 63 y.o.malewith a past medical history significant for 4-vessel CABG, metastatic sigmoid colon cancer to the liver (with partial colectomy and wedge resection of the liver), diabetes  mellitus, gastroesophageal reflux disease, hypertension and peripheral neuropathy who presented with left-sided chest pain found to have ST elevation myocardial infarction  Assessment & Plan    #ST elevation MI #CAD status post prior CABG x4 #Severe three-vessel disease Denies chest pain however feels very anxious about his procedure today. -Continue aspirin, Brilinta, metoprolol, Lipitor, Zetia -Plan atherectomy of left main today  #Dyspnea He reports of orthopnea and paroxysmal nocturnal dyspnea.  This  is multifactorial and could be due to Brilinta versus volume overload as he did have bilateral crackles on lung auscultation as well as anxiety.  He did receive oral Lasix 40 mg yesterday and had adequate urine output. -IV Ativan 0.5 mg -IV Lasix 20 mg -Continue to monitor  #Insulin-dependent diabetes mellitus A1c of 12%.  CBG in the 200s-300s -Continue insulin  #Hyperlipidemia -Continue Zetia, Lipitor -Consider adding Vascepa for his hypertriglyceridemia  #Hypertension BP overnight 120s-160s. -Increase Toprol to 100 mg BID -Continue amlodipine, ramipril -As needed hydralazine  #AKI -resolved  For questions or updates, please contact [facility] Please consult [email or web address] for contact info under Cardiology/STEMI.      Signed, Jean Rosenthal, MD  09/15/2019, 6:36 AM

## 2019-09-15 NOTE — Interval H&P Note (Signed)
Cath Lab Visit (complete for each Cath Lab visit)  Clinical Evaluation Leading to the Procedure:   ACS: Yes.    Non-ACS:    Anginal Classification: CCS IV  Anti-ischemic medical therapy: Maximal Therapy (2 or more classes of medications)  Non-Invasive Test Results: No non-invasive testing performed  Prior CABG: Previous CABG      History and Physical Interval Note:  09/15/2019 10:48 AM  Ian [name]  has presented today for surgery, with the diagnosis of cad.  The various methods of treatment have been discussed with the patient and family. After consideration of risks, benefits and other options for treatment, the patient has consented to  Procedure(s): CORONARY STENT INTERVENTION (N/A) as a surgical intervention.  The patient's history has been reviewed, patient examined, no change in status, stable for surgery.  I have reviewed the patient's chart and labs.  Questions were answered to the patient's satisfaction.     Sherren Mocha

## 2019-09-15 NOTE — Progress Notes (Signed)
Inpatient Diabetes Program Recommendations  AACE/ADA: New Consensus Statement on Inpatient Glycemic Control (2015)  Target Ranges:  Prepandial:   less than 140 mg/dL      Peak postprandial:   less than 180 mg/dL (1-2 hours)      Critically ill patients:  140 - 180 mg/dL   Lab Results  Component Value Date   GLUCAP 225 (H) 09/15/2019   HGBA1C 12.0 (H) 09/14/2019    Review of Glycemic Control Results for KREG, EARHART (MRN [id number]) as of 09/15/2019 10:29  Ref. Range 09/14/2019 11:20 09/14/2019 16:04 09/14/2019 21:55 09/15/2019 06:49  Glucose-Capillary Latest Ref Range: 70 - 99 mg/dL 311 (H) 261 (H) 237 (H) 225 (H)  Diabetes history: DM 2 Outpatient Diabetes medications:  Basaglar 40 units q HS, Januvia 100 mg daily, 75/25- 16 units tid with meals  Current orders for Inpatient glycemic control:  Lantus 20 units q HS, Novolog moderate tid with meals  Inpatient Diabetes Program Recommendations:    Please consider increasing Lantus to 30 units q HS.  Also once diet restarted, consider adding Novolog 4 units tid with meals.   Thanks  Adah Perl, RN, BC-ADM Inpatient Diabetes Coordinator Pager [phone] (8a-5p)

## 2019-09-15 NOTE — Plan of Care (Signed)
  Problem: Education: Goal: Knowledge of General Education information will improve Description: Including pain rating scale, medication(s)/side effects and non-pharmacologic comfort measures Outcome: Progressing   Problem: Health Behavior/Discharge Planning: Goal: Ability to manage health-related needs will improve Outcome: Progressing   Problem: Pain Managment: Goal: General experience of comfort will improve Outcome: Progressing   Problem: Cardiac: Goal: Vascular access site(s) Level 0-1 will be maintained Outcome: Progressing

## 2019-09-16 ENCOUNTER — Encounter (HOSPITAL_COMMUNITY): Payer: Self-pay | Admitting: Cardiology

## 2019-09-16 LAB — BASIC METABOLIC PANEL
Anion gap: 12 (ref 5–15)
BUN: 10 mg/dL (ref 8–23)
CO2: 22 mmol/L (ref 22–32)
Calcium: 9.1 mg/dL (ref 8.9–10.3)
Chloride: 106 mmol/L (ref 98–111)
Creatinine, Ser: 1.02 mg/dL (ref 0.61–1.24)
GFR calc Af Amer: >60 mL/min (ref >60)
GFR calc non Af Amer: >60 mL/min (ref >60)
Glucose, Bld: 166 mg/dL — ABNORMAL HIGH (ref 70–99)
Potassium: 3.5 mmol/L (ref 3.5–5.1)
Sodium: 140 mmol/L (ref 135–145)

## 2019-09-16 LAB — CBC
HCT: 37.1 % — ABNORMAL LOW (ref 39.0–52.0)
Hemoglobin: 12.1 g/dL — ABNORMAL LOW (ref 13.0–17.0)
MCH: 29.4 pg (ref 26.0–34.0)
MCHC: 32.6 g/dL (ref 30.0–36.0)
MCV: 90.3 fL (ref 80.0–100.0)
Platelets: 260 10*3/uL (ref 150–400)
RBC: 4.11 MIL/uL — ABNORMAL LOW (ref 4.22–5.81)
RDW: 15.6 % — ABNORMAL HIGH (ref 11.5–15.5)
WBC: 9.5 10*3/uL (ref 4.0–10.5)
nRBC: 0 % (ref 0.0–0.2)

## 2019-09-16 LAB — GLUCOSE, CAPILLARY
Glucose-Capillary: 162 mg/dL — ABNORMAL HIGH (ref 70–99)
Glucose-Capillary: 315 mg/dL — ABNORMAL HIGH (ref 70–99)

## 2019-09-16 MED ORDER — TICAGRELOR 90 MG PO TABS: 90.0000 mg | ORAL_TABLET | Freq: Two times a day (BID) | ORAL | 1 refills | Status: DC

## 2019-09-16 MED ORDER — EZETIMIBE 10 MG PO TABS: 10.0000 mg | ORAL_TABLET | Freq: Every day | ORAL | 1 refills | Status: DC

## 2019-09-16 MED ORDER — METOPROLOL SUCCINATE ER 100 MG PO TB24: 100.0000 mg | ORAL_TABLET | Freq: Two times a day (BID) | ORAL | 1 refills | Status: DC

## 2019-09-16 MED ORDER — ATORVASTATIN CALCIUM 80 MG PO TABS: 80.0000 mg | ORAL_TABLET | Freq: Every day | ORAL | 1 refills | Status: DC

## 2019-09-16 MED ORDER — HEPARIN SOD (PORK) LOCK FLUSH 100 UNIT/ML IV SOLN
500.0000 [IU] | INTRAVENOUS | Status: AC | PRN
  Administered 2019-09-16: 500 [IU]
  Filled 2019-09-16: qty 5

## 2019-09-16 MED FILL — METOPROLOL SUCCINATE ER 100: 100 | 30 days supply | Qty: 60 | Fill #0

## 2019-09-16 MED FILL — BRILINTA 90 MG TABLET: 90 | 30 days supply | Qty: 60 | Fill #0

## 2019-09-16 MED FILL — EZETIMIBE 10 MG TABS: 10 | 30 days supply | Qty: 30 | Fill #0

## 2019-09-16 MED FILL — ATORVASTATIN CALCIUM 80 MG: 80 | 90 days supply | Qty: 90 | Fill #0

## 2019-09-16 NOTE — Progress Notes (Addendum)
This RN reviewed d/c instructions, medication list, and answered all questions. Provided education on diet and importance of increased control of blood glucose levels. Pt denied pain or discomfort. VS WNL.

## 2019-09-16 NOTE — Progress Notes (Signed)
CARDIAC REHAB PHASE I   PRE:  Rate/Rhythm: 98 SR  BP:  Supine:   Sitting: 153/81  Standing:    SaO2: 96%RA  MODE:  Ambulation: 370 ft   POST:  Rate/Rhythm: 107 ST  BP:  Supine:   Sitting: 150/84  Standing:    SaO2: 97%RA [id number] Pt walked 370 ft on RA with steady gait and no CP. Tolerated well. To recliner after walk. No questions at this time re MI ed done.   Graylon Good, RN BSN  09/16/2019 9:27 AM

## 2019-09-16 NOTE — Progress Notes (Addendum)
Progress Note  Patient Name: JESAIAH FABIANO Date of Encounter: 09/16/2019  Primary Cardiologist: No primary care provider on file.   Subjective   He is doing well this morning following his left main arterectomy and PCI.  Denies any subsequent chest pain.  He has been ambulating in the room without any difficulties.  Also denies orthopnea or PND.  Inpatient Medications    Scheduled Meds: . amLODipine  10 mg Oral Daily  . aspirin EC  81 mg Oral Daily  . atorvastatin  80 mg Oral Daily  . Chlorhexidine Gluconate Cloth  6 each Topical Daily  . ezetimibe  10 mg Oral Daily  . heparin  5,000 Units Subcutaneous Q8H  . insulin aspart  0-15 Units Subcutaneous TID WC  . insulin aspart  4 Units Subcutaneous TID WC  . insulin glargine  30 Units Subcutaneous QHS  . metoprolol succinate  100 mg Oral BID  . pantoprazole  40 mg Oral Daily  . ramipril  10 mg Oral Daily  . ticagrelor  90 mg Oral BID   Continuous Infusions: . sodium chloride 10 mL/hr at 09/13/19 2329  . sodium chloride 10 mL/hr at 09/16/19 0600  . nitroGLYCERIN Stopped (09/15/19 1645)   PRN Meds: sodium chloride, acetaminophen, influenza vac split quadrivalent PF, labetalol, morphine injection, nitroGLYCERIN, ondansetron (ZOFRAN) IV, oxyCODONE, prochlorperazine   Vital Signs    Vitals:   09/16/19 0400 09/16/19 0409 09/16/19 0500 09/16/19 0620  BP: (!) 146/78  (!) 141/77 (!) 149/80  Pulse: 96  94 93  Resp: 19  14 18   Temp:  97.8 F (36.6 C)    TempSrc:  Oral    SpO2: 95%  97% 97%  Weight:      Height:        Intake/Output Summary (Last 24 hours) at 09/16/2019 0639 Last data filed at 09/16/2019 0600 Gross per 24 hour  Intake 1419.93 ml  Output 1300 ml  Net 119.93 ml   Filed Weights   09/13/19 2313 09/14/19 0200  Weight: 89.8 kg 87.6 kg    Telemetry    No arrhythmia- Personally Reviewed  ECG    None today- Personally Reviewed  Physical Exam   GEN: No acute distress.   Cardiac: RRR, no murmurs,  rubs, or gallops.  Respiratory: Clear to auscultation bilaterally. MS: No edema; No deformity. Neuro:  Nonfocal  Psych: Normal affect   Labs    Chemistry Recent Labs  Lab 09/13/19 2318 09/14/19 0243 09/15/19 0844  NA 137 138 139  K 3.7 3.8 3.4*  CL 102 98 104  CO2 20* 25 21*  GLUCOSE 393* 351* 203*  BUN 21 16 14   CREATININE 1.34* 1.27*  1.25* 1.00  CALCIUM 9.2 9.2 9.1  PROT 7.9  --   --   ALBUMIN 4.2  --   --   AST 25  --   --   ALT 28  --   --   ALKPHOS 94  --   --   BILITOT 0.5  --   --   GFRNONAA 56* >60  >60 >60  GFRAA >60 >60  >60 >60  ANIONGAP 15 15 14      Hematology Recent Labs  Lab 09/13/19 2318 09/14/19 0243 09/15/19 0611  WBC 8.3 10.9* 8.8  RBC 4.78 4.56 3.96*  HGB 13.8 13.6 11.6*  HCT 43.2 40.6 35.2*  MCV 90.4 89.0 88.9  MCH 28.9 29.8 29.3  MCHC 31.9 33.5 33.0  RDW 15.7* 15.5 15.2  PLT 261 273  231    Cardiac EnzymesNo results for input(s): TROPONINI in the last 168 hours. No results for input(s): TROPIPOC in the last 168 hours.   BNP Recent Labs  Lab 09/14/19 0243  BNP 141.9*     DDimer No results for input(s): DDIMER in the last 168 hours.   Radiology    No results found.  Cardiac Studies   09/14/2019-[facility]   Prox RCA to Mid RCA lesion is 100% stenosed.  Ost LAD to Prox LAD lesion is 100% stenosed.  Mid LM to Dist LM lesion is 95% stenosed.  Prox Cx to Mid Cx lesion is 90% stenosed.  Mid Cx to Dist Cx lesion is 90% stenosed.  3rd Mrg lesion is 100% stenosed.  Left radial artery.  Prox Graft lesion is 95% stenosed.  Mid Graft to Dist Graft lesion is 99% stenosed.  Origin to Prox Graft lesion is 100% stenosed.  Origin to Prox Graft lesion is 100% stenosed.  LIMA and is large.  A drug-eluting stent was successfully placed using a STENT RESOLUTE ONYX [id number].  Post intervention, there is a 0% residual stenosis.  A drug-eluting stent was successfully placed using a STENT RESOLUTE ONYX [id number].  Post  intervention, there is a 0% residual stenosis.  1. Severe three-vessel coronary artery disease with total occlusion of the RCA, total occlusion of the LAD, severe stenosis of the left main, and severe stenosis of the left circumflex 2. Status post aortocoronary bypass surgery with chronic occlusion of the saphenous vein graft to OM1, saphenous vein graft to OM 2, continued patency of the LIMA to LAD, and continued patency of the left radial graft to PDA with severe sequential stenoses in the proximal and mid body of the graft. 3. Successful PCI of the left radial artery to PDA graft using a 2.75 x 22 mm resolute Onyx DES in the mid body of the graft and a 2.75 x 15 mm resolute Onyx DES in the proximal body of the graft  Recommendations: Dual antiplatelet therapy with aspirin and ticagrelor at least 12 months without interruption, aggressive guideline directed medical therapy, staged PCI of the left main and left circumflex which is no longer protected because of saphenous vein graft occlusion. The left main is heavily calcified and I think the patient will require atherectomy to facilitate stent expansion through this area. If he has no procedural/post MI complications, would tentatively proceed on TODAY.   09/08/2019-coronary stent intervention, coronary arthrectomy  Successful orbital atherectomy, PTCA, and overlapping DES treatment of severe calcific stenosis in the left main and left circumflex using a 2.5x34 mm Resolute Onyx DES (left circumflex) and 3.0x30 mm Resolute Onyx DES (overlapped proximally in the LCx extending into left main)  Patient Profile     Arlyn Buerkle Andersis a 63 y.o.malewith a past medical history significant for 4-vessel CABG, metastatic sigmoid colon cancer to the liver (with partial colectomy and wedge resection of the liver), diabetes mellitus, gastroesophageal reflux disease, hypertension and peripheral neuropathywho presented with left-sided chest pain found to  have ST elevation myocardial infarction  Assessment & Plan    #Non-STEMI #CAD status post prior CABG x4 #Severe three-vessel disease s/p PCI, coronary atherectomy  Doing very well following his procedure and denies chest pain, shortness of breath, orthopnea or proximal nocturnal dyspnea. He is very well stable for discharge once he is done ambulating in the hallway.  -Continue aspirin, Brilinta, metoprolol, Lipitor, Zetia -Plan atherectomy of left main today  #Insulin-dependent diabetes mellitus A1c of 12%.   -  Increased Lantus to 30U qhs and added Novolog 4U TID -Continue SSI  #Hyperlipidemia -Continue Zetia, Lipitor -Consider adding Vascepa for his hypertriglyceridemia  #Hypertension -Continue Toprol to 100 mg BID -Continue amlodipine, ramipril -Will add HCTZ 12.5mg  daily  -prn Labetalol   For questions or updates, please contact [facility] Please consult [email or web address] for contact info under Cardiology/STEMI.      Signed, Jean Rosenthal, MD  09/16/2019, 6:39 AM

## 2019-09-16 NOTE — Progress Notes (Addendum)
R Radial TR band removed by this RN @ 0150. Gauze/ Tegaderm dressing applied- site is Level 1-r/t minimal bleeding.

## 2019-09-16 NOTE — Discharge Summary (Addendum)
Discharge Summary    Patient ID: Ian [name],  MRN: [id number], DOB/AGE: 62/18/1957 62 y.o.  Admit date: 09/13/2019 Discharge date: 09/16/2019  Primary Care Provider: Sharilyn Sites Primary Cardiologist: No primary care provider on file.  Discharge Diagnoses    Principal Problem:   NSTEMI (non-ST elevated myocardial infarction) [facility]) Active Problems:   Essential hypertension   Coronary artery disease involving native coronary artery of native heart with unstable angina pectoris ([place])   Hyperlipidemia associated with type 2 diabetes mellitus ([place])   Diabetes mellitus type II, uncontrolled ([place])   Presence of drug-eluting stent in RCA, LM-LCx   Allergies Allergies  Allergen Reactions   Anectine [Succinylcholine] Other (See Comments)    Atypical pseudocholinesterase deficiency.    Nsaids Other (See Comments)    Contraindicated (can only have asa 81mg )    Diagnostic Studies/Procedures    Cath: 09/14/19   Prox RCA to Mid RCA lesion is 100% stenosed.  Ost LAD to Prox LAD lesion is 100% stenosed.  Mid LM to Dist LM lesion is 95% stenosed.  Prox Cx to Mid Cx lesion is 90% stenosed.  Mid Cx to Dist Cx lesion is 90% stenosed.  3rd Mrg lesion is 100% stenosed.  Left radial artery.  Prox Graft lesion is 95% stenosed.  Mid Graft to Dist Graft lesion is 99% stenosed.  Origin to Prox Graft lesion is 100% stenosed.  Origin to Prox Graft lesion is 100% stenosed.  LIMA and is large.  A drug-eluting stent was successfully placed using a STENT RESOLUTE ONYX [id number].  Post intervention, there is a 0% residual stenosis.  A drug-eluting stent was successfully placed using a STENT RESOLUTE ONYX [id number].  Post intervention, there is a 0% residual stenosis.   1.  Severe three-vessel coronary artery disease with total occlusion of the RCA, total occlusion of the LAD, severe stenosis of the left main, and severe stenosis of the left circumflex 2.  Status post  aortocoronary bypass surgery with chronic occlusion of the saphenous vein graft to OM1, saphenous vein graft to OM 2, continued patency of the LIMA to LAD, and continued patency of the left radial graft to PDA with severe sequential stenoses in the proximal and mid body of the graft. 3.  Successful PCI of the left radial artery to PDA graft using a 2.75 x 22 mm resolute Onyx DES in the mid body of the graft and a 2.75 x 15 mm resolute Onyx DES in the proximal body of the graft  Recommendations: Dual antiplatelet therapy with aspirin and ticagrelor at least 12 months without interruption, aggressive guideline directed medical therapy, staged PCI of the left main and left circumflex which is no longer protected because of saphenous vein graft occlusion.  The left main is heavily calcified and I think the patient will require atherectomy to facilitate stent expansion through this area.  If he has no procedural/post MI complications, would tentatively proceed on Tuesday.  Diagnostic Dominance: Right  Intervention     Cath: 09/15/19  Successful orbital atherectomy, PTCA, and overlapping DES treatment of severe calcific stenosis in the left main and left circumflex using a 2.5x34 mm Resolute Onyx DES (left circumflex) and 3.0x30 mm Resolute Onyx DES (overlapped proximally in the LCx extending into left main)  Diagnostic Dominance: Right  Intervention    TTE: 09/14/19  IMPRESSIONS    1. Left ventricular ejection fraction, by visual estimation, is 60 to 65%. The left ventricle has normal function. Normal left ventricular size. There  is mildly increased left ventricular hypertrophy.  2. Left ventricular diastolic Doppler parameters are consistent with impaired relaxation pattern of LV diastolic filling.  3. Global right ventricle has normal systolic function.The right ventricular size is normal. No increase in right ventricular wall thickness.  4. Left atrial size was normal.  5. Right  atrial size was normal.  6. The mitral valve is normal in structure. Mild mitral valve regurgitation.  7. The tricuspid valve is normal in structure. Tricuspid valve regurgitation is trivial.  8. The aortic valve is tricuspid Aortic valve regurgitation is trivial by color flow Doppler. Mild to moderate aortic valve sclerosis/calcification without any evidence of aortic stenosis.  9. The pulmonic valve was grossly normal. Pulmonic valve regurgitation is not visualized by color flow Doppler. 10. The inferior vena cava is normal in size with greater than 50% respiratory variability, suggesting right atrial pressure of 3 mmHg. _____________   History of Present Illness     Ian [name] is a 63 y.o. male with a past medical history significant for 4-vessel CABG, metastatic sigmoid colon cancer to the liver (with partial colectomy and wedge resection of the liver), diabetes mellitus, gastroesophageal reflux disease, hypertension and peripheral neuropathy who presented to the hospital with complaints of left-sided chest pain that radiated to the left arm.  He had been having the pain intermittently for the past 7 days.  The day of admission the chest discomfort was associated with diaphoresis, dyspnea and nausea.  For previous episodes he had taken nitroglycerin with some limited relief. He sees a cardiologist at [facility] who had prescribed this medication for him. The pain the day of admission was however more severe in intensity.  He had also run out of his nitroglycerin.  In the ED (at the outside hospital) the blood pressure was 179/103 mmHg with a heart rate of 115 bpm. He was treated with aspirin, unfractionated IV heparin bolus and infusion and IV nitroglycerin.  He was also given 4 mg of IV morphine. The ECG showed Q waves in V1 and V2 with ST elevations.  There was also an ST elevation in aVR.  There were diffuse ST depressions in the inferolateral leads. He was therefore transferred to [facility] for consideration for an emergent/urgent cardiac catheterization procedure.  His labs were as follows: Creatinine 1.34, potassium 3.7, glucose 393, hs-Trop 171, hemoglobin 13.8, hematocrit 43.2, and platelets 261.  The chest x-ray revealed minimal basilar atelectasis.  Hospital Course     Consultants: none  Initial cardiac catheterization showed severe three-vessel disease underwent successful PCI of left radial artery to PDA graft with DES x2.  He was started on dual antiplatelet therapy with aspirin/Brilinta for at least 1 year.  Plan for staged intervention to left main and circumflex which was no longer protected because of SVG occlusion.  High-sensitivity troponin peaked at greater than 11,000.  Lipid panel showed triglycerides 517, unable to calculate LDL, total cholesterol 227.  Had previously been on Vytorin prior to admission this was switched to high-dose statin with atorvastatin 80 mg along with Zetia 10mg  daily.  Considered adding Vascepa as an outpatient. Went back for staged orbital atherectomy/PTCA and overlapping DES to severely calcified stenosis in the left main and left circumflex.  Total of 2 DES placed.  His blood pressures remain elevated and his Toprol was increased from 75 to 100 mg twice daily.  He was continued on amlodipine 10 mg daily along with Altace 10 mg daily.  Hemoglobin A1c noted at  12.  Home medication regimen included both short and long-acting insulin, Januvia 100 mg daily, metformin 500 mg twice daily and Farxiga 10 mg daily.  Blood pressures did remain elevated and his hydrochlorothiazide 12.5mg  daily was added back.  Of note did DC his home Diltiazem.  He worked well with cardiac rehab without recurrent chest pain.  He has been followed by a cardiologist through [facility] but wishes to transfer his care to [facility] heart care.  Ian [name] was seen by Dr. Ellyn Hack and determined stable for discharge home. Follow up in  the office has been arranged. Medications are listed below.   _____________  Discharge Vitals Blood pressure 133/71, pulse 81, temperature 98 F (36.7 C), temperature source Oral, resp. rate 15, height 5\' 4"  (1.626 m), weight 87.6 kg, SpO2 97 %.  Filed Weights   09/13/19 2313 09/14/19 0200  Weight: 89.8 kg 87.6 kg    Labs & Radiologic Studies    CBC Recent Labs    09/13/19 2318  09/15/19 0611 09/16/19 0700  WBC 8.3   < > 8.8 9.5  NEUTROABS 3.7  --   --   --   HGB 13.8   < > 11.6* 12.1*  HCT 43.2   < > 35.2* 37.1*  MCV 90.4   < > 88.9 90.3  PLT 261   < > 231 260   < > = values in this interval not displayed.   Basic Metabolic Panel Recent Labs    09/15/19 0844 09/16/19 0700  NA 139 140  K 3.4* 3.5  CL 104 106  CO2 21* 22  GLUCOSE 203* 166*  BUN 14 10  CREATININE 1.00 1.02  CALCIUM 9.1 9.1   Liver Function Tests Recent Labs    09/13/19 2318  AST 25  ALT 28  ALKPHOS 94  BILITOT 0.5  PROT 7.9  ALBUMIN 4.2   No results for input(s): LIPASE, AMYLASE in the last 72 hours. Cardiac Enzymes No results for input(s): CKTOTAL, CKMB, CKMBINDEX, TROPONINI in the last 72 hours. BNP Invalid input(s): POCBNP D-Dimer No results for input(s): DDIMER in the last 72 hours. Hemoglobin A1C Recent Labs    09/14/19 0243  HGBA1C 12.0*   Fasting Lipid Panel Recent Labs    09/14/19 0243  CHOL 206*  HDL 32*  LDLCALC UNABLE TO CALCULATE IF TRIGLYCERIDE OVER 400 mg/dL  TRIG 410*  CHOLHDL 6.4  LDLDIRECT 141.4*   Thyroid Function Tests No results for input(s): TSH, T4TOTAL, T3FREE, THYROIDAB in the last 72 hours.  Invalid input(s): FREET3 _____________  Dg Chest Port 1 View  Result Date: 09/13/2019 CLINICAL DATA:  Chest pain on the left EXAM: PORTABLE CHEST 1 VIEW COMPARISON:  10/10/2018 FINDINGS: Cardiac shadow is stable. Postsurgical changes are again seen. Left chest wall port is again noted. Lungs are well aerated bilaterally. Minimal basilar atelectasis is  seen. No confluent infiltrate is not no bony abnormality is seen. IMPRESSION: Minimal basilar atelectasis. Electronically Signed   By: Inez Catalina M.D.   On: 09/13/2019 23:32   Disposition   Pt is being discharged home today in good condition.  Follow-up Plans & Appointments    Follow-up Information    [place], Crista Luria, [place] Follow up on 09/24/2019.   Specialty: Cardiology Why: at 11am for your follow up appt.  Contact information: [address] [place] [address] [phone]        Sharilyn Sites, MD Follow up.   Specialty: Family Medicine Why:  Please keep close follow up regarding your diabetes management Contact information: [place] [address] [phone]          Discharge Instructions    Amb Referral to Cardiac Rehabilitation   Complete by: As directed    Diagnosis:  STEMI Coronary Stents     After initial evaluation and assessments completed: Virtual Based Care may be provided alone or in conjunction with Phase 2 Cardiac Rehab based on patient barriers.: Yes   Call MD for:  redness, tenderness, or signs of infection (pain, swelling, redness, odor or green/yellow discharge around incision site)   Complete by: As directed    Diet - low sodium heart healthy   Complete by: As directed    Discharge instructions   Complete by: As directed    Radial Site Care Refer to this sheet in the next few weeks. These instructions provide you with information on caring for yourself after your procedure. Your caregiver may also give you more specific instructions. Your treatment has been planned according to current medical practices, but problems sometimes occur. Call your caregiver if you have any problems or questions after your procedure. HOME CARE INSTRUCTIONS You may shower the day after the procedure.Remove the bandage (dressing) and gently wash the site with plain soap and water.Gently pat the site dry.  Do not apply powder or  lotion to the site.  Do not submerge the affected site in water for 3 to 5 days.  Inspect the site at least twice daily.  Do not flex or bend the affected arm for 24 hours.  No lifting over 5 pounds (2.3 kg) for 5 days after your procedure.  What to expect: Any bruising will usually fade within 1 to 2 weeks.  Blood that collects in the tissue (hematoma) may be painful to the touch. It should usually decrease in size and tenderness within 1 to 2 weeks.  SEEK IMMEDIATE MEDICAL CARE IF: You have unusual pain at the radial site.  You have redness, warmth, swelling, or pain at the radial site.  You have drainage (other than a small amount of blood on the dressing).  You have chills.  You have a fever or persistent symptoms for more than 72 hours.  You have a fever and your symptoms suddenly get worse.  Your arm becomes pale, cool, tingly, or numb.  You have heavy bleeding from the site. Hold pressure on the site.   PLEASE DO NOT MISS ANY DOSES OF YOUR BRILINTA!!!!! Also keep a log of you blood pressures and bring back to your follow up appt. Please call the office with any questions.   Patients taking blood thinners should generally stay away from medicines like ibuprofen, Advil, Motrin, naproxen, and Aleve due to risk of stomach bleeding. You may take Tylenol as directed or talk to your primary doctor about alternatives.   No driving for 2 weeks. No lifting over 10 lbs for 4 weeks. No sexual activity for 4 weeks. You may not return to work until cleared by your cardiologist. Keep procedure site clean & dry. If you notice increased pain, swelling, bleeding or pus, call/return!  You may shower, but no soaking baths/hot tubs/pools for 1 week.   Increase activity slowly   Complete by: As directed        Discharge Medications     Medication List    STOP taking these medications   diltiazem 240 MG 24 hr capsule Commonly known as: CARDIZEM CD   ezetimibe-simvastatin 10-80  MG  tablet Commonly known as: VYTORIN   lidocaine-prilocaine cream Commonly known as: EMLA     TAKE these medications   amLODipine 10 MG tablet Commonly known as: NORVASC TAKE ONE TABLET BY MOUTH ONCE DAILY. Notes to patient: NEXT DOSE TOMORROW AM   aspirin EC 81 MG tablet Take 81 mg by mouth daily. Notes to patient: NEXT DOSE TOMORROW AM   atorvastatin 80 MG tablet Commonly known as: LIPITOR Take 1 tablet (80 mg total) by mouth daily. Start taking on: September 17, 2019 Notes to patient: NEXT DOSE TOMORROW AM   Basaglar KwikPen 100 UNIT/ML Sopn Inject 40 Units into the skin at bedtime.   CoQ10 100 MG Caps Take 100 mg by mouth daily.   diphenhydramine-acetaminophen 25-500 MG Tabs tablet Commonly known as: TYLENOL PM Take 2 tablets by mouth at bedtime.   ezetimibe 10 MG tablet Commonly known as: ZETIA Take 1 tablet (10 mg total) by mouth daily. Start taking on: September 17, 2019 Notes to patient: NEXT DOSE TOMORROW AM   Farxiga 10 MG Tabs tablet Generic drug: dapagliflozin propanediol Take 10 mg by mouth daily. Notes to patient: RESUME HOME SCHEDULE   FISH OIL PO Take 1 capsule by mouth daily. Notes to patient: RESUME HOME SCHEDULE   gabapentin 300 MG capsule Commonly known as: NEURONTIN Take 300 mg by mouth at bedtime. Notes to patient: RESUME HOME SCHEDULE   hydrochlorothiazide 12.5 MG capsule Commonly known as: MICROZIDE Take 12.5 mg by mouth daily. Notes to patient: TOMORROW AM   insulin aspart protamine- aspart (70-30) 100 UNIT/ML injection Commonly known as: NOVOLOG MIX 70/30 Inject 20 Units into the skin 3 (three) times daily with meals. Notes to patient: RESUME HOME SCHEDULE   isosorbide mononitrate 30 MG 24 hr tablet Commonly known as: IMDUR Take 30 mg by mouth daily. Notes to patient: TOMORROW AM   Januvia 100 MG tablet Generic drug: sitaGLIPtin Take 100 mg by mouth daily. Notes to patient: RESUME HOME SCHEDULE   loperamide 2 MG  tablet Commonly known as: Imodium A-D Take 2 capsules after first loose stool and 1 capsule after each loose stool thereafter.   metFORMIN 500 MG tablet Commonly known as: GLUCOPHAGE Take 500 mg by mouth 2 (two) times daily. Notes to patient: RESUME HOME SCHEDULE   metoprolol succinate 100 MG 24 hr tablet Commonly known as: TOPROL-XL Take 1 tablet (100 mg total) by mouth 2 (two) times daily. Take with or immediately following a meal. What changed:   medication strength  how much to take  additional instructions Notes to patient: NEXT DOSE DUE THIS PM   nitroGLYCERIN 0.4 MG SL tablet Commonly known as: NITROSTAT Place 1 tablet under the tongue every 5 (five) minutes x 3 doses as needed for chest pain.   pantoprazole 40 MG tablet Commonly known as: PROTONIX Take 40 mg by mouth daily. Notes to patient: NEXT DOSE TOMORROW AM   ramipril 10 MG capsule Commonly known as: ALTACE Take 10 mg by mouth daily. Notes to patient: NEXT DOSE TOMORROW AM   ticagrelor 90 MG Tabs tablet Commonly known as: BRILINTA Take 1 tablet (90 mg total) by mouth 2 (two) times daily. Notes to patient: NEXT DOSE THIS EVENING        Yes                               AHA/ACC Clinical Performance & Quality Measures: 1. Aspirin prescribed? - Yes 2.  ADP Receptor Inhibitor (Plavix/Clopidogrel, Brilinta/Ticagrelor or Effient/Prasugrel) prescribed (includes medically managed patients)? - Yes 3. Beta Blocker prescribed? - Yes 4. High Intensity Statin (Lipitor 40-80mg  or Crestor 20-40mg ) prescribed? - Yes 5. EF assessed during THIS hospitalization? - Yes 6. For EF <40%, was ACEI/ARB prescribed? - Yes 7. For EF <40%, Aldosterone Antagonist (Spironolactone or Eplerenone) prescribed? - Not Applicable (EF >/= 15%) 8. Cardiac Rehab Phase II ordered (Included Medically managed Patients)? - Yes     Outstanding Labs/Studies   FLP/LFTs in 6 weeks.   Duration of Discharge Encounter   Greater than 30 minutes  including physician time.  Signed, Reino Bellis NP-C 09/16/2019, 2:14 PM  I have seen, examined and evaluated the patient this morning on team rounds along with the resident physician.  After reviewing all the available data and chart, we discussed the patients laboratory, study & physical findings as well as symptoms in detail. I agree with his findings, examination as well as impression recommendations as per our discussion.    I personally reviewed the heart cath films and was present upon completion of the PCI yesterday.  (Note comments that atherectomy plan today, that was yesterday yesterday)  Ian [name] is feeling much better today after his staged PCI.  Much less anxious.  Still remains tachycardic and hypertensive, but overall doing well. He has ambulated in the hallway without any difficulty.  Plan today is to add HCTZ back for blood pressure management.  We increase his Toprol up to 100 mg twice daily.  Remains on Zetia and Lipitor.  I agree with adding Vascepa for hypertriglyceridemia.  Patient is indicated that he would like to follow-up with Dr. Burt Knack if possible.  He indicated that he feels more comfortable having the cardiologist who is taking care of him in the setting of an MI takeover.  This would mean transferring from his [facility] cardiologist to a local cardiologist.   Stable for discharge today.   Glenetta Hew, M.D., M.S. Interventional Cardiologist   Pager # [phone] Phone # [phone] [address]. [place], [place] [address]

## 2019-09-16 NOTE — Care Management (Signed)
09-16-19 CM spoke with patient regarding Brilinta. Patient will receive 30 day free Rx via [place]. Patient is aware of co pay cost and he has been provided the Brilinta co pay assistance card to provide to his pharmacy for further discount. No further needs identified by CM at this time. Bethena Roys, RN, BSN Case Manager [phone]

## 2019-09-24 ENCOUNTER — Other Ambulatory Visit: Payer: Self-pay

## 2019-09-24 ENCOUNTER — Ambulatory Visit (INDEPENDENT_AMBULATORY_CARE_PROVIDER_SITE_OTHER): Payer: 59 | Admitting: Physician Assistant

## 2019-09-24 ENCOUNTER — Encounter: Payer: Self-pay | Admitting: Physician Assistant

## 2019-09-24 VITALS — BP 156/82 | HR 98 | Ht 64.0 in | Wt 196.2 lb

## 2019-09-24 DIAGNOSIS — E781 Pure hyperglyceridemia: Secondary | ICD-10-CM

## 2019-09-24 DIAGNOSIS — G4733 Obstructive sleep apnea (adult) (pediatric): Secondary | ICD-10-CM

## 2019-09-24 DIAGNOSIS — I25110 Atherosclerotic heart disease of native coronary artery with unstable angina pectoris: Secondary | ICD-10-CM

## 2019-09-24 DIAGNOSIS — Z794 Long term (current) use of insulin: Secondary | ICD-10-CM

## 2019-09-24 DIAGNOSIS — E1165 Type 2 diabetes mellitus with hyperglycemia: Secondary | ICD-10-CM

## 2019-09-24 DIAGNOSIS — E785 Hyperlipidemia, unspecified: Secondary | ICD-10-CM | POA: Diagnosis not present

## 2019-09-24 DIAGNOSIS — IMO0002 Reserved for concepts with insufficient information to code with codable children: Secondary | ICD-10-CM

## 2019-09-24 DIAGNOSIS — I10 Essential (primary) hypertension: Secondary | ICD-10-CM

## 2019-09-24 MED ORDER — HYDROCHLOROTHIAZIDE 25 MG PO TABS: 25.0000 mg | ORAL_TABLET | Freq: Every day | ORAL | 3 refills | Status: DC

## 2019-09-24 NOTE — Patient Instructions (Addendum)
Medication Instructions:  Your physician has recommended you make the following change in your medication:  1.  INCREASE the Hydrochlorothiazide to 25 mg daily  *If you need a refill on your cardiac medications before your next appointment, please call your pharmacy*  Lab Work: 10/01/2019:  Go to the [facility] across from the hospital for this lab 11/06/2019:  Go to the [facility] for FASTING labs, nothing to eat or drink after mindnigh  If you have labs (blood work) drawn today and your tests are completely normal, you will receive your results only by: Marland Kitchen MyChart Message (if you have MyChart) OR . A paper copy in the mail If you have any lab test that is abnormal or we need to change your treatment, we will call you to review the results.  Testing/Procedures: None ordered  Follow-Up: At [facility], you and your health needs are our priority.  As part of our continuing mission to provide you with exceptional heart care, we have created designated Provider Care Teams.  These Care Teams include your primary Cardiologist (physician) and Advanced Practice Providers (APPs -  Physician Assistants and Nurse Practitioners) who all work together to provide you with the care you need, when you need it.  Your next appointment:   3 months  The format for your next appointment:   In Person  Provider:   Sherren Mocha, MD  Other Instructions Get a new blood pressure cuff.  Monitor your blood pressure at least 1 time a day, at least 2-3 hours after you take your blood pressure medications.  Keep a log and either sign up for mychart and send [place] a message, or phone in your readings to [place].

## 2019-09-24 NOTE — Progress Notes (Signed)
Cardiology Office Note    Date:  09/24/2019   ID:  Ian [name], DOB Mar 13, 1956, MRN [id number]  PCP:  Sharilyn Sites, MD  Cardiologist:  Dr. Burt Knack   Chief Complaint: Hospital follow up   History of Present Illness:   SARP VERNIER is a 63 y.o. male with hx of CAD s/p CABG in 2008,  metastatic sigmoid colon cancer to the liver (with partial colectomy and wedge resection of the liver), diabetes mellitus, gastroesophageal reflux disease, hypertension and peripheral neuropathy who presents for hospital follow up.   Admitted 09/14/2019 with STEMI. The ECG showed Q waves in V1 and V2 with ST elevations.  There was also an ST elevation in aVR.  There were diffuse ST depressions in the inferolateral leads. Emergent cardiac catheterization showed severe three-vessel disease underwent successful PCI of PDA graft of RCA with DES x2.  He was started on dual antiplatelet therapy with aspirin/Brilinta for at least 1 year. He underwent successful staged orbital atherectomy, PTCA, and overlapping DES treatment of severe calcific stenosis in the left main and left circumflex using a 2.5x34 mm Resolute Onyx DES (left circumflex) and 3.0x30 mm Resolute Onyx DES (overlapped proximally in the LCx extending into left main). Echo with preserved LVEF.  Statin and blood pressure medication adjusted.  Here today for follow-up.  Compliant with his medication.  Walking 5 minutes without any discomfort.  He had substernal chest pressure and pain between shoulder prior to hospital presentation.  No recurrence of pain since discharge.  Denies chest pain, shortness of breath, orthopnea, PND, syncope, lower extremity edema or melena.   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 m-dLM 95% - p-mCx 90%, m-d Cx 90%. 2 Site DES PCI: LRad-RPDA - prox 95% (Resolute Onyx DES 2.75 x 15) , m-d 99% (Resolute Onyx 2.75 x 22); c) 09/16/19: Orbital Atherectomy LM-p-m LCx - Overlapping DES 2.5 x 34 ( LCx) -> 3 x 30 (LM-LCx)   Diabetes mellitus without complication (HCC)    Family history of adverse reaction to anesthesia    sister also has atypical pseudo-cholinesterase deficiency   GERD (gastroesophageal reflux disease)    History of heart bypass surgery    Hypertension     Past Surgical History:  Procedure Laterality Date   COLONOSCOPY N/A 08/29/2018   Procedure: COLONOSCOPY;  Surgeon: Daneil Dolin, MD;  [facility];  Service: Endoscopy;  Laterality: N/A;  9:30   CORONARY ARTERY BYPASS GRAFT     4 vessels   CORONARY ATHERECTOMY N/A 09/15/2019   Procedure: CORONARY ATHERECTOMY;  Surgeon: Sherren Mocha, MD;  Location: [facility];  Service: Cardiovascular;  LM-LCx Atherectomy - DES PCI (*Protected LM)    CORONARY STENT INTERVENTION N/A 09/15/2019   Procedure: CORONARY STENT INTERVENTION;  Surgeon: Sherren Mocha, MD;  Location: [facility];; Successful orbital atherectomy, PTCA, and overlapping DES treatment of severe calcific stenosis in the left main and left circumflex using a 2.5x34 mm Resolute Onyx DES (left circumflex) and 3.0x30 mm Resolute Onyx DES (overlapped proximally in the LCx extending into left main)   CORONARY/GRAFT ACUTE  MI REVASCULARIZATION N/A 09/14/2019   Procedure: Coronary/Graft Acute MI Revascularization;  Surgeon: Sherren Mocha, MD;  Location: [facility];  Service: Cardiovascular;  Laterality: N/A;   heart bypass     INSERTION OF MESH  02/07/2015   Procedure: INSERTION OF MESH;  Surgeon: Aviva Signs Md, MD;  Location: [facility];   Service: General;;   LEFT HEART CATH AND CORONARY ANGIOGRAPHY N/A 09/14/2019   Procedure: LEFT HEART CATH AND CORONARY ANGIOGRAPHY;  Surgeon: Sherren Mocha, MD;  Location: [facility];  Service: Cardiovascular;  Laterality: N/A;   PARTIAL COLECTOMY N/A 09/17/2018   Procedure: PARTIAL COLECTOMY WITH PARTIAL WEDGE RESECTION LIVER METASTASIS;  Surgeon: Aviva Signs, MD;  Location: [facility];  Service: General;  Laterality: N/A;   POLYPECTOMY  08/29/2018   Procedure: POLYPECTOMY;  Surgeon: Daneil Dolin, MD;  [facility];  Service: Endoscopy;;   PORTACATH PLACEMENT Left 10/10/2018   Procedure: INSERTION PORT-A-CATH (catheter in left subclavian);  Surgeon: Aviva Signs, MD;  Location: [facility];  Service: General;  Laterality: Left;   UMBILICAL HERNIA REPAIR N/A 02/07/2015   Procedure: UMBILICAL HERNIORRHAPHY WITH MESH;  Surgeon: Aviva Signs Md, MD;  Location: [facility];  Service: General;  Laterality: N/A;    Current Medications: Prior to Admission medications   Medication Sig Start Date End Date Taking? Authorizing Provider  amLODipine (NORVASC) 10 MG tablet TAKE ONE TABLET BY MOUTH ONCE DAILY. 03/23/19   Lockamy, Randi L, NP-C  aspirin EC 81 MG tablet Take 81 mg by mouth daily.    [provider]  atorvastatin (LIPITOR) 80 MG tablet Take 1 tablet (80 mg total) by mouth daily. 09/17/19   Cheryln Manly, NP  Coenzyme Q10 (COQ10) 100 MG CAPS Take 100 mg by mouth daily.    [provider]  diphenhydramine-acetaminophen (TYLENOL PM) 25-500 MG TABS tablet Take 2 tablets by mouth at bedtime.    [provider]  ezetimibe (ZETIA) 10 MG tablet Take 1 tablet (10 mg total) by mouth daily. 09/17/19   Cheryln Manly, NP  FARXIGA 10 MG TABS tablet Take 10 mg by mouth daily.  12/19/18   [provider]  gabapentin (NEURONTIN) 300 MG capsule Take 300 mg by mouth at bedtime.    [provider]  hydrochlorothiazide (MICROZIDE) 12.5 MG capsule  Take 12.5 mg by mouth daily.    [provider]  insulin aspart protamine- aspart (NOVOLOG MIX 70/30) (70-30) 100 UNIT/ML injection Inject 20 Units into the skin 3 (three) times daily with meals.    [provider]  Insulin Glargine (BASAGLAR KWIKPEN) 100 UNIT/ML SOPN Inject 40 Units into the skin at bedtime.  12/10/18   [provider]  isosorbide mononitrate (IMDUR) 30 MG 24 hr tablet Take 30 mg by mouth daily. 09/12/19   [provider]  JANUVIA 100 MG tablet Take 100 mg by mouth daily.  12/19/18   [provider]  loperamide (IMODIUM A-D) 2 MG tablet Take 2 capsules after first loose stool and 1 capsule after each loose stool thereafter. 10/27/18   Derek Jack, MD  metFORMIN (GLUCOPHAGE) 500 MG tablet Take 500 mg by mouth 2 (two) times daily. 08/27/19   [provider]  metoprolol succinate (TOPROL-XL) 100 MG 24 hr tablet Take 1 tablet (100 mg total) by mouth 2 (two) times daily. Take with or immediately following a meal. 09/16/19   Reino Bellis B, NP  nitroGLYCERIN (NITROSTAT) 0.4 MG SL tablet Place 1 tablet under the tongue  every 5 (five) minutes x 3 doses as needed for chest pain. 09/12/19   [provider]  Omega-3 Fatty Acids (FISH OIL PO) Take 1 capsule by mouth daily.    [provider]  pantoprazole (PROTONIX) 40 MG tablet Take 40 mg by mouth daily.    [provider]  ramipril (ALTACE) 10 MG capsule Take 10 mg by mouth daily.    [provider]  ticagrelor (BRILINTA) 90 MG TABS tablet Take 1 tablet (90 mg total) by mouth 2 (two) times daily. 09/16/19   Cheryln Manly, NP    Allergies:   Anectine [succinylcholine] and Nsaids   Social History   Socioeconomic History   Marital status: Married    Spouse name: Not on file   Number of children: 3   Years of education: Not on file   Highest education level: Not on file  Occupational History   Occupation: Sports administrator: [employer] resource strain: Not hard at all   Food insecurity    Worry: Never true    Inability: Never true   Transportation needs    Medical: No    Non-medical: No  Tobacco Use   Smoking status: Never Smoker   Smokeless tobacco: Never Used  Substance and Sexual Activity   Alcohol use: No   Drug use: No   Sexual activity: Yes    Birth control/protection: None  Lifestyle   Physical activity    Days per week: 0 days    Minutes per session: 0 min   Stress: Not at all  Relationships   Social connections    Talks on phone: More than three times a week    Gets together: More than three times a week    Attends religious service: Never    Active member of club or organization: No    Attends meetings of clubs or organizations: Never    Relationship status: Married  Other Topics Concern   Not on file  Social History Narrative   Not on file     Family History:  The patient's family history includes Diabetes in his brother, brother, father, sister, and sister; Heart disease in his brother and father; Hypertension in his brother, father, sister, and son; Lymphoma in his brother.   ROS:   Please see the history of present illness.    ROS All other systems reviewed and are negative.   PHYSICAL EXAM:   VS:  BP (!) 156/82    Pulse 98    Ht 5\' 4"  (1.626 m)    Wt 196 lb 3.2 oz (89 kg)    SpO2 98%    BMI 33.68 kg/m    GEN: Well nourished, well developed, in no acute distress  HEENT: normal  Neck: no JVD, carotid bruits, or masses Cardiac: RRR; no murmurs, rubs, or gallops,no edema  Respiratory:  clear to auscultation bilaterally, normal work of breathing GI: soft, nontender, nondistended, + BS MS: no deformity or atrophy  Skin: warm and dry, no rash Neuro:  Alert and Oriented x 3, Strength and sensation are intact Psych: euthymic mood, full affect  Wt Readings from Last 3 Encounters:  09/24/19 196 lb 3.2 oz (89 kg)    09/14/19 193 lb 2 oz (87.6 kg)  05/07/19 209 lb 8 oz (95 kg)      Studies/Labs Reviewed:   EKG:  EKG is ordered today.  The ekg ordered today demonstrates normal sinus  rhythm at rate of 98 bpm  Recent Labs: 11/03/2018: Magnesium 2.3 09/13/2019: ALT 28 09/14/2019: B Natriuretic Peptide 141.9 09/16/2019: BUN 10; Creatinine, Ser 1.02; Hemoglobin 12.1; Platelets 260; Potassium 3.5; Sodium 140   Lipid Panel    Component Value Date/Time   CHOL 206 (H) 09/14/2019 0243   TRIG 410 (H) 09/14/2019 0243   HDL 32 (L) 09/14/2019 0243   CHOLHDL 6.4 09/14/2019 0243   VLDL UNABLE TO CALCULATE IF TRIGLYCERIDE OVER 400 mg/dL 09/14/2019 0243   LDLCALC UNABLE TO CALCULATE IF TRIGLYCERIDE OVER 400 mg/dL 09/14/2019 0243   LDLDIRECT 141.4 (H) 09/14/2019 0243    Additional studies/ records that were reviewed today include:   Cath: 09/14/19   Prox RCA to Mid RCA lesion is 100% stenosed.  Ost LAD to Prox LAD lesion is 100% stenosed.  Mid LM to Dist LM lesion is 95% stenosed.  Prox Cx to Mid Cx lesion is 90% stenosed.  Mid Cx to Dist Cx lesion is 90% stenosed.  3rd Mrg lesion is 100% stenosed.  Left radial artery.  Prox Graft lesion is 95% stenosed.  Mid Graft to Dist Graft lesion is 99% stenosed.  Origin to Prox Graft lesion is 100% stenosed.  Origin to Prox Graft lesion is 100% stenosed.  LIMA and is large.  A drug-eluting stent was successfully placed using a STENT RESOLUTE ONYX [id number].  Post intervention, there is a 0% residual stenosis.  A drug-eluting stent was successfully placed using a STENT RESOLUTE ONYX [id number].  Post intervention, there is a 0% residual stenosis.  1. Severe three-vessel coronary artery disease with total occlusion of the RCA, total occlusion of the LAD, severe stenosis of the left main, and severe stenosis of the left circumflex 2. Status post aortocoronary bypass surgery with chronic occlusion of the saphenous vein graft to OM1, saphenous vein  graft to OM 2, continued patency of the LIMA to LAD, and continued patency of the left radial graft to PDA with severe sequential stenoses in the proximal and mid body of the graft. 3. Successful PCI of the left radial artery to PDA graft using a 2.75 x 22 mm resolute Onyx DES in the mid body of the graft and a 2.75 x 15 mm resolute Onyx DES in the proximal body of the graft  Recommendations: Dual antiplatelet therapy with aspirin and ticagrelor at least 12 months without interruption, aggressive guideline directed medical therapy, staged PCI of the left main and left circumflex which is no longer protected because of saphenous vein graft occlusion. The left main is heavily calcified and I think the patient will require atherectomy to facilitate stent expansion through this area. If he has no procedural/post MI complications, would tentatively proceed on Tuesday.  Diagnostic Dominance: Right  Intervention     Cath: 09/15/19  Successful orbital atherectomy, PTCA, and overlapping DES treatment of severe calcific stenosis in the left main and left circumflex using a 2.5x34 mm Resolute Onyx DES (left circumflex) and 3.0x30 mm Resolute Onyx DES (overlapped proximally in the LCx extending into left main)  Diagnostic Dominance: Right  Intervention    TTE: 09/14/19  IMPRESSIONS   1. Left ventricular ejection fraction, by visual estimation, is 60 to 65%. The left ventricle has normal function. Normal left ventricular size. There is mildly increased left ventricular hypertrophy. 2. Left ventricular diastolic Doppler parameters are consistent with impaired relaxation pattern of LV diastolic filling. 3. Global right ventricle has normal systolic function.The right ventricular size is normal. No increase in right ventricular wall  thickness. 4. Left atrial size was normal. 5. Right atrial size was normal. 6. The mitral valve is normal in structure. Mild mitral valve  regurgitation. 7. The tricuspid valve is normal in structure. Tricuspid valve regurgitation is trivial. 8. The aortic valve is tricuspid Aortic valve regurgitation is trivial by color flow Doppler. Mild to moderate aortic valve sclerosis/calcification without any evidence of aortic stenosis. 9. The pulmonic valve was grossly normal. Pulmonic valve regurgitation is not visualized by color flow Doppler. 10. The inferior vena cava is normal in size with greater than 50% respiratory variability, suggesting right atrial pressure of 3 mmHg. _____________   ASSESSMENT & PLAN:    1. CAD -Cath as above.  No recurrent angina.  Continue dual antiplatelet therapy with aspirin and Brilinta.  Continue statin, Imdur, beta-blocker and ACE.  2.  Hypertension - Blood pressure elevated.  Compliant with his medication.  He eats  diet high in salt, recommended low-sodium diet. Increase hydrochlorothiazide to 25 mg daily.  Continue other medications at current dose.  He denies snoring.  He will keep a log of his blood pressure and report to [place] in 2 weeks.  3.  Hyperlipidemia with hypertriglyceridemia -LDL goal less than 70 - 09/14/2019: Cholesterol 206; HDL 32; LDL Cholesterol UNABLE TO CALCULATE IF TRIGLYCERIDE OVER 400 mg/dL; Triglycerides 410; VLDL UNABLE TO CALCULATE IF TRIGLYCERIDE OVER 400 mg/dL  -Lipitor and Zetia -Repeat labs in 6-week  4.  Diabetes mellitus -Uncontrolled.  Hemoglobin A1c 12.  Followed by PCP.  I have at length discussion regarding lifestyle modifications including regular exercise and heart healthy low carb diet.  I have spent greater than 25 minutes discussing importance of medications and lifestyle changes. Recommended CRP II.     Medication Adjustments/Labs and Tests Ordered: Current medicines are reviewed at length with the patient today.  Concerns regarding medicines are outlined above.  Medication changes, Labs and Tests ordered today are listed in the Patient  Instructions below. Patient Instructions  Medication Instructions:  Your physician has recommended you make the following change in your medication:  1.  INCREASE the Hydrochlorothiazide to 25 mg daily  *If you need a refill on your cardiac medications before your next appointment, please call your pharmacy*  Lab Work: 10/01/2019:  Go to the [facility] across from the hospital for this lab 11/06/2019:  Go to the [facility] for FASTING labs, nothing to eat or drink after mindnigh  If you have labs (blood work) drawn today and your tests are completely normal, you will receive your results only by:  [place] (if you have MyChart) OR  A paper copy in the mail If you have any lab test that is abnormal or we need to change your treatment, we will call you to review the results.  Testing/Procedures: None ordered  Follow-Up: At [facility], you and your health needs are our priority.  As part of our continuing mission to provide you with exceptional heart care, we have created designated Provider Care Teams.  These Care Teams include your primary Cardiologist (physician) and Advanced Practice Providers (APPs -  Physician Assistants and Nurse Practitioners) who all work together to provide you with the care you need, when you need it.  Your next appointment:   3 months  The format for your next appointment:   In Person  Provider:   Sherren Mocha, MD  Other Instructions Get a new blood pressure cuff.  Monitor your blood pressure at least 1 time a day, at least 2-3  hours after you take your blood pressure medications.  Keep a log and either sign up for mychart and send [place] a message, or phone in your readings to [place].     Jarrett Soho, [place]  09/24/2019 11:36 AM    [facility] [address], [place], [place]  [address] Phone: [phone]; Fax: [phone]

## 2019-12-28 ENCOUNTER — Other Ambulatory Visit: Payer: Self-pay | Admitting: *Deleted

## 2019-12-28 NOTE — Telephone Encounter (Signed)
Received call from pt's wife, TC.  Pt been having chest pains 2-3 times per week for lat 2-3 week. Confirmed appt with Dr. Burt Knack 01/01/2020 @ 8:20 and refill of Nitro sent to [facility].  Pt's wife was very grateful for the care received.

## 2020-01-01 ENCOUNTER — Encounter: Payer: Self-pay | Admitting: Cardiovascular Disease

## 2020-01-01 ENCOUNTER — Other Ambulatory Visit: Payer: Self-pay

## 2020-01-01 ENCOUNTER — Ambulatory Visit (INDEPENDENT_AMBULATORY_CARE_PROVIDER_SITE_OTHER): Payer: 59 | Admitting: Cardiovascular Disease

## 2020-01-01 ENCOUNTER — Other Ambulatory Visit: Payer: Self-pay | Admitting: Cardiovascular Disease

## 2020-01-01 ENCOUNTER — Ambulatory Visit (HOSPITAL_BASED_OUTPATIENT_CLINIC_OR_DEPARTMENT_OTHER)
Admission: RE | Admit: 2020-01-01 | Discharge: 2020-01-01 | Disposition: A | Discharge status: Home / Self Care | Payer: 59 | Source: Ambulatory Visit | Attending: Cardiology | Admitting: Cardiology

## 2020-01-01 VITALS — BP 136/70 | HR 79 | Ht 64.0 in | Wt 203.8 lb

## 2020-01-01 DIAGNOSIS — T82855A Stenosis of coronary artery stent, initial encounter: Secondary | ICD-10-CM | POA: Diagnosis not present

## 2020-01-01 DIAGNOSIS — Z888 Allergy status to other drugs, medicaments and biological substances status: Secondary | ICD-10-CM | POA: Diagnosis not present

## 2020-01-01 DIAGNOSIS — R710 Precipitous drop in hematocrit: Secondary | ICD-10-CM | POA: Diagnosis not present

## 2020-01-01 DIAGNOSIS — E1169 Type 2 diabetes mellitus with other specified complication: Secondary | ICD-10-CM | POA: Diagnosis not present

## 2020-01-01 DIAGNOSIS — L97909 Non-pressure chronic ulcer of unspecified part of unspecified lower leg with unspecified severity: Secondary | ICD-10-CM

## 2020-01-01 DIAGNOSIS — E785 Hyperlipidemia, unspecified: Secondary | ICD-10-CM | POA: Diagnosis not present

## 2020-01-01 DIAGNOSIS — I25110 Atherosclerotic heart disease of native coronary artery with unstable angina pectoris: Secondary | ICD-10-CM | POA: Diagnosis not present

## 2020-01-01 DIAGNOSIS — I2119 ST elevation (STEMI) myocardial infarction involving other coronary artery of inferior wall: Secondary | ICD-10-CM | POA: Diagnosis not present

## 2020-01-01 DIAGNOSIS — E1165 Type 2 diabetes mellitus with hyperglycemia: Secondary | ICD-10-CM

## 2020-01-01 DIAGNOSIS — I2510 Atherosclerotic heart disease of native coronary artery without angina pectoris: Secondary | ICD-10-CM | POA: Diagnosis not present

## 2020-01-01 DIAGNOSIS — I083 Combined rheumatic disorders of mitral, aortic and tricuspid valves: Secondary | ICD-10-CM | POA: Diagnosis not present

## 2020-01-01 DIAGNOSIS — E782 Mixed hyperlipidemia: Secondary | ICD-10-CM

## 2020-01-01 DIAGNOSIS — Z20822 Contact with and (suspected) exposure to covid-19: Secondary | ICD-10-CM | POA: Diagnosis not present

## 2020-01-01 DIAGNOSIS — E1142 Type 2 diabetes mellitus with diabetic polyneuropathy: Secondary | ICD-10-CM | POA: Diagnosis not present

## 2020-01-01 DIAGNOSIS — Z955 Presence of coronary angioplasty implant and graft: Secondary | ICD-10-CM | POA: Diagnosis not present

## 2020-01-01 DIAGNOSIS — Z951 Presence of aortocoronary bypass graft: Secondary | ICD-10-CM | POA: Diagnosis not present

## 2020-01-01 DIAGNOSIS — IMO0002 Reserved for concepts with insufficient information to code with codable children: Secondary | ICD-10-CM

## 2020-01-01 DIAGNOSIS — I10 Essential (primary) hypertension: Secondary | ICD-10-CM | POA: Diagnosis not present

## 2020-01-01 DIAGNOSIS — I25810 Atherosclerosis of coronary artery bypass graft(s) without angina pectoris: Secondary | ICD-10-CM | POA: Diagnosis not present

## 2020-01-01 DIAGNOSIS — Z794 Long term (current) use of insulin: Secondary | ICD-10-CM

## 2020-01-01 DIAGNOSIS — Z9049 Acquired absence of other specified parts of digestive tract: Secondary | ICD-10-CM | POA: Diagnosis not present

## 2020-01-01 MED ORDER — NITROGLYCERIN 0.4 MG SL SUBL: 0.4000 mg | SUBLINGUAL_TABLET | SUBLINGUAL | 3 refills | Status: DC | PRN

## 2020-01-01 MED ORDER — CLOPIDOGREL BISULFATE 75 MG PO TABS: 75.0000 mg | ORAL_TABLET | Freq: Every day | ORAL | 3 refills | Status: DC

## 2020-01-01 NOTE — Patient Instructions (Signed)
Medication Instructions:  1) STOP BRILINTA after your PM dose tonight. 2) START PLAVIX tomorrow morning. For your first dose, take Plavix 300 mg (4 pills). Then take Plavix 75 mg daily starting 01/03/20. *If you need a refill on your cardiac medications before your next appointment, please call your pharmacy*  Testing/Procedures: Your provider has requested that you have a lexiscan myoview. For further information please visit [email or web address]. Please follow instruction sheet, as given.  Your provider has requested that you have a lower extremity arterial duplex ASAP. During this test, ultrasound is used to evaluate arterial blood flow in the legs. Allow one hour for this exam. There are no restrictions or special instructions.    Follow-Up: Your provider recommends that you schedule a follow-up appointment in 3 months with Richardson Dopp, PA.  You will be scheduled with Dr. Gwenlyn Found after the ultrasound of your legs is complete.

## 2020-01-01 NOTE — Progress Notes (Signed)
Cardiology Office Note:    Date:  01/01/2020   ID:  Ian [name], DOB 01/28/1956, MRN [id number]  PCP:  Sharilyn Sites, MD  Cardiologist:  Sherren Mocha, MD  Electrophysiologist:  None   Referring MD: Sharilyn Sites, MD   Chief Complaint  Patient presents with  . Coronary Artery Disease  . Foot Pain    History of Present Illness:    Ian [name] is a 64 y.o. male with a hx of coronary artery disease status post CABG in 2008, metastatic colon cancer to the liver with history of partial colectomy and wedge resection of the liver, type 2 diabetes, hypertension, and peripheral neuropathy.  He was hospitalized in October 2020 with ST elevation infarction treated with primary PCI of the radial artery to PDA graft using 2 drug-eluting stents.  He underwent staged PCI with orbital atherectomy and stenting of a protected left main stem into the circumflex, also treated with 2 overlapping drug-eluting stents.  LV function is preserved by echo assessment.  He was seen back in follow-up September 24, 2019 and was doing well at that time.  The patient is here alone today.  He has been experiencing pain in the upper back, not typically related to walking or routine physical exertion.  He does state that it bothers him more after working harder.  He has had some discomfort in the chest and has a difficult time deciphering whether this is similar to his past cardiac pain.  He denies orthopnea, PND, or heart palpitations.  He also complains of a sore on his right big toe and pain in the right foot.  He does not have typical symptoms of calf claudication.  Past Medical History:  Diagnosis Date  . 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 Colon cancer ([place])   . Complication of anesthesia    atypical pseudo-cholinesterase deficiency  . Coronary artery disease involving native coronary artery of native heart with unstable angina pectoris ([place]) 01/10/2013    A) Mv CAD- CABGx 4 (LIMA-LAD, SVG-rPDA, SVG-OM1,  SVG-OM2z);09/14/19: NSTEMI - Cath: p-mRCA, Ost-prox LAD, OM3 & SVG-OM1, SVG-OM2 100% CTO; Patentl LIMA-LAD. m-dLM 95% - p-mCx 90%, m-d Cx 90%. 2 Site DES PCI: LRad-RPDA - prox 95% (Resolute Onyx DES 2.75 x 15) , m-d 99% (Resolute Onyx 2.75 x 22); c) 09/16/19: Orbital Atherectomy LM-p-m LCx - Overlapping DES 2.5 x 34 ( LCx) -> 3 x 30 (LM-LCx)  . Diabetes mellitus without complication ([place])   . Family history of adverse reaction to anesthesia    sister also has atypical pseudo-cholinesterase deficiency  . GERD (gastroesophageal reflux disease)   . History of heart bypass surgery   . Hypertension     Past Surgical History:  Procedure Laterality Date  . COLONOSCOPY N/A 08/29/2018   Procedure: COLONOSCOPY;  Surgeon: Daneil Dolin, MD;  [facility];  Service: Endoscopy;  Laterality: N/A;  9:30  . CORONARY ARTERY BYPASS GRAFT     4 vessels  . CORONARY ATHERECTOMY N/A 09/15/2019   Procedure: CORONARY ATHERECTOMY;  Surgeon: Sherren Mocha, MD;  Location: [facility] [facility];  Service: Cardiovascular;  LM-LCx Atherectomy - DES PCI (*Protected LM)   . CORONARY STENT INTERVENTION N/A 09/15/2019   Procedure: CORONARY STENT INTERVENTION;  Surgeon: Sherren Mocha, MD;  Location: [place] [facility];; Successful orbital atherectomy, PTCA, and overlapping DES treatment of severe calcific stenosis in the left main and left circumflex using a 2.5x34 mm Resolute Onyx DES (left circumflex) and 3.0x30 mm Resolute Onyx DES (  overlapped proximally in the LCx extending into left main)  . CORONARY/GRAFT ACUTE MI REVASCULARIZATION N/A 09/14/2019   Procedure: Coronary/Graft Acute MI Revascularization;  Surgeon: Sherren Mocha, MD;  Location: [place] [facility];  Service: Cardiovascular;  Laterality: N/A;  . heart bypass    . INSERTION OF MESH  02/07/2015   Procedure: INSERTION OF MESH;  Surgeon: Aviva Signs Md, MD;  Location: [facility];  Service: General;;  . LEFT HEART CATH AND CORONARY ANGIOGRAPHY N/A  09/14/2019   Procedure: LEFT HEART CATH AND CORONARY ANGIOGRAPHY;  Surgeon: Sherren Mocha, MD;  Location: [place] [facility];  Service: Cardiovascular;  Laterality: N/A;  . PARTIAL COLECTOMY N/A 09/17/2018   Procedure: PARTIAL COLECTOMY WITH PARTIAL WEDGE RESECTION LIVER METASTASIS;  Surgeon: Aviva Signs, MD;  Location: [facility];  Service: General;  Laterality: N/A;  . POLYPECTOMY  08/29/2018   Procedure: POLYPECTOMY;  Surgeon: Daneil Dolin, MD;  [facility];  Service: Endoscopy;;  . PORTACATH PLACEMENT Left 10/10/2018   Procedure: INSERTION PORT-A-CATH (catheter in left subclavian);  Surgeon: Aviva Signs, MD;  Location: [facility];  Service: General;  Laterality: Left;  . UMBILICAL HERNIA REPAIR N/A 02/07/2015   Procedure: UMBILICAL HERNIORRHAPHY WITH MESH;  Surgeon: Aviva Signs Md, MD;  Location: [facility];  Service: General;  Laterality: N/A;    Current Medications: Current Meds  Medication Sig  . amLODipine (NORVASC) 10 MG tablet TAKE ONE TABLET BY MOUTH ONCE DAILY.  Marland Kitchen aspirin EC 81 MG tablet Take 81 mg by mouth daily.  Marland Kitchen atorvastatin (LIPITOR) 80 MG tablet Take 1 tablet (80 mg total) by mouth daily.  . Coenzyme Q10 (COQ10) 100 MG CAPS Take 100 mg by mouth daily.  . diphenhydramine-acetaminophen (TYLENOL PM) 25-500 MG TABS tablet Take 2 tablets by mouth at bedtime.  Marland Kitchen ezetimibe (ZETIA) 10 MG tablet Take 1 tablet (10 mg total) by mouth daily.  Marland Kitchen FARXIGA 10 MG TABS tablet Take 10 mg by mouth daily.   Marland Kitchen gabapentin (NEURONTIN) 300 MG capsule Take 300 mg by mouth at bedtime.  . insulin aspart protamine- aspart (NOVOLOG MIX 70/30) (70-30) 100 UNIT/ML injection Inject 20 Units into the skin 3 (three) times daily with meals.  . Insulin Glargine (BASAGLAR KWIKPEN) 100 UNIT/ML SOPN Inject 40 Units into the skin at bedtime.   . isosorbide mononitrate (IMDUR) 30 MG 24 hr tablet Take 30 mg by mouth daily.  Marland Kitchen JANUVIA 100 MG tablet Take 100 mg by mouth daily.   Marland Kitchen loperamide (IMODIUM A-D)  2 MG tablet Take 2 capsules after first loose stool and 1 capsule after each loose stool thereafter.  . metFORMIN (GLUCOPHAGE) 500 MG tablet Take 500 mg by mouth 2 (two) times daily.  . metoprolol succinate (TOPROL-XL) 100 MG 24 hr tablet Take 1 tablet (100 mg total) by mouth 2 (two) times daily. Take with or immediately following a meal.  . nitroGLYCERIN (NITROSTAT) 0.4 MG SL tablet Place 1 tablet (0.4 mg total) under the tongue every 5 (five) minutes x 3 doses as needed for chest pain.  . Omega-3 Fatty Acids (FISH OIL PO) Take 1 capsule by mouth daily.  . pantoprazole (PROTONIX) 40 MG tablet Take 40 mg by mouth daily.  . ramipril (ALTACE) 10 MG capsule Take 10 mg by mouth daily.  . [DISCONTINUED] nitroGLYCERIN (NITROSTAT) 0.4 MG SL tablet Place 1 tablet under the tongue every 5 (five) minutes x 3 doses as needed for chest pain.  . [DISCONTINUED] ticagrelor (BRILINTA) 90 MG TABS tablet Take 1  tablet (90 mg total) by mouth 2 (two) times daily.     Allergies:   Anectine [succinylcholine] and Nsaids   Social History   Socioeconomic History  . Marital status: Married    Spouse name: Not on file  . Number of children: 3  . Years of education: Not on file  . Highest education level: Not on file  Occupational History  . Occupation: Programmer, systems: [employer]  Tobacco Use  . Smoking status: Never Smoker  . Smokeless tobacco: Never Used  Substance and Sexual Activity  . Alcohol use: No  . Drug use: No  . Sexual activity: Yes    Birth control/protection: None  Other Topics Concern  . Not on file  Social History Narrative  . Not on file   Social Determinants of Health   Financial Resource Strain:   . Difficulty of Paying Living Expenses: Not on file  Food Insecurity:   . Worried About Charity fundraiser in the Last Year: Not on file  . Ran Out of Food in the Last Year: Not on file  Transportation Needs:   . Lack of Transportation (Medical): Not on file  .  Lack of Transportation (Non-Medical): Not on file  Physical Activity:   . Days of Exercise per Week: Not on file  . Minutes of Exercise per Session: Not on file  Stress:   . Feeling of Stress : Not on file  Social Connections:   . Frequency of Communication with Friends and Family: Not on file  . Frequency of Social Gatherings with Friends and Family: Not on file  . Attends Religious Services: Not on file  . Active Member of Clubs or Organizations: Not on file  . Attends Archivist Meetings: Not on file  . Marital Status: Not on file     Family History: The patient's family history includes Diabetes in his brother, brother, father, sister, and sister; Heart disease in his brother and father; Hypertension in his brother, father, sister, and son; Lymphoma in his brother.  ROS:   Please see the history of present illness.    All other systems reviewed and are negative.  EKGs/Labs/Other Studies Reviewed:    The following studies were reviewed today: Echo 09-14-2019: IMPRESSIONS    1. Left ventricular ejection fraction, by visual estimation, is 60 to  65%. The left ventricle has normal function. Normal left ventricular size.  There is mildly increased left ventricular hypertrophy.  2. Left ventricular diastolic Doppler parameters are consistent with  impaired relaxation pattern of LV diastolic filling.  3. Global right ventricle has normal systolic function.The right  ventricular size is normal. No increase in right ventricular wall  thickness.  4. Left atrial size was normal.  5. Right atrial size was normal.  6. The mitral valve is normal in structure. Mild mitral valve  regurgitation.  7. The tricuspid valve is normal in structure. Tricuspid valve  regurgitation is trivial.  8. The aortic valve is tricuspid Aortic valve regurgitation is trivial by  color flow Doppler. Mild to moderate aortic valve sclerosis/calcification  without any evidence of aortic  stenosis.  9. The pulmonic valve was grossly normal. Pulmonic valve regurgitation is  not visualized by color flow Doppler.  10. The inferior vena cava is normal in size with greater than 50%  respiratory variability, suggesting right atrial pressure of 3 mmHg.   Cardiac Cath 09-14-2019: Conclusion    Prox RCA to Mid RCA lesion is  100% stenosed.  Ost LAD to Prox LAD lesion is 100% stenosed.  Mid LM to Dist LM lesion is 95% stenosed.  Prox Cx to Mid Cx lesion is 90% stenosed.  Mid Cx to Dist Cx lesion is 90% stenosed.  3rd Mrg lesion is 100% stenosed.  Left radial artery.  Prox Graft lesion is 95% stenosed.  Mid Graft to Dist Graft lesion is 99% stenosed.  Origin to Prox Graft lesion is 100% stenosed.  Origin to Prox Graft lesion is 100% stenosed.  LIMA and is large.  A drug-eluting stent was successfully placed using a STENT RESOLUTE ONYX [id number].  Post intervention, there is a 0% residual stenosis.  A drug-eluting stent was successfully placed using a STENT RESOLUTE ONYX [id number].  Post intervention, there is a 0% residual stenosis.   1.  Severe three-vessel coronary artery disease with total occlusion of the RCA, total occlusion of the LAD, severe stenosis of the left main, and severe stenosis of the left circumflex 2.  Status post aortocoronary bypass surgery with chronic occlusion of the saphenous vein graft to OM1, saphenous vein graft to OM 2, continued patency of the LIMA to LAD, and continued patency of the left radial graft to PDA with severe sequential stenoses in the proximal and mid body of the graft. 3.  Successful PCI of the left radial artery to PDA graft using a 2.75 x 22 mm resolute Onyx DES in the mid body of the graft and a 2.75 x 15 mm resolute Onyx DES in the proximal body of the graft  Recommendations: Dual antiplatelet therapy with aspirin and ticagrelor at least 12 months without interruption, aggressive guideline directed medical therapy,  staged PCI of the left main and left circumflex which is no longer protected because of saphenous vein graft occlusion.  The left main is heavily calcified and I think the patient will require atherectomy to facilitate stent expansion through this area.  If he has no procedural/post MI complications, would tentatively proceed on Tuesday.   Coronary Diagrams  Diagnostic Dominance: Right  Intervention   Implants   Permanent Stent  Stent Resolute Onyx 2.75x22 - [id number] - Implanted  Inventory item: Loma Sousa 3.97Q73 Model/Cat number: [id number]     PCI 09-15-2019: Conclusion  Successful orbital atherectomy, PTCA, and overlapping DES treatment of severe calcific stenosis in the left main and left circumflex using a 2.5x34 mm Resolute Onyx DES (left circumflex) and 3.0x30 mm Resolute Onyx DES (overlapped proximally in the LCx extending into left main)  Recommendations  Antiplatelet/Anticoag Recommend uninterrupted dual antiplatelet therapy with Aspirin 81mg  daily and Ticagrelor 90mg  twice daily for a minimum of 12 months (ACS-Class I recommendation).     EKG:  EKG is not ordered today.   Recent Labs: 09/13/2019: ALT 28 09/14/2019: B Natriuretic Peptide 141.9 09/16/2019: BUN 10; Creatinine, Ser 1.02; Hemoglobin 12.1; Platelets 260; Potassium 3.5; Sodium 140  Recent Lipid Panel    Component Value Date/Time   CHOL 206 (H) 09/14/2019 0243   TRIG 410 (H) 09/14/2019 0243   HDL 32 (L) 09/14/2019 0243   CHOLHDL 6.4 09/14/2019 0243   VLDL UNABLE TO CALCULATE IF TRIGLYCERIDE OVER 400 mg/dL 09/14/2019 0243   LDLCALC UNABLE TO CALCULATE IF TRIGLYCERIDE OVER 400 mg/dL 09/14/2019 0243   LDLDIRECT 141.4 (H) 09/14/2019 0243    Physical Exam:    VS:  BP 136/70   Pulse 79   Ht 5\' 4"  (1.626 m)   Wt 203 lb 12.8 oz (92.4 kg)   SpO2 96%  BMI 34.98 kg/m     Wt Readings from Last 3 Encounters:  01/01/20 203 lb 12.8 oz (92.4 kg)  09/24/19 196 lb 3.2 oz (89 kg)  09/14/19  193 lb 2 oz (87.6 kg)     GEN:  Well nourished, well developed in no acute distress HEENT: Normal NECK: No JVD; No carotid bruits LYMPHATICS: No lymphadenopathy CARDIAC: RRR, no murmurs, rubs, gallops. The right PT is monophasic by doppler, DP absent. The left DP is monophasic by doppler, left PT is absent. RESPIRATORY:  Clear to auscultation without rales, wheezing or rhonchi  ABDOMEN: Soft, non-tender, non-distended MUSCULOSKELETAL:  No edema; No deformity  SKIN: Warm and dry. There is an ulcer at the tip of the right great toe and at the nailbed of the right 3rd toe. Dependent rubor of the right foot. NEUROLOGIC:  Alert and oriented x 3 PSYCHIATRIC:  Normal affect   ASSESSMENT:    1. Coronary artery disease involving native coronary artery of native heart without angina pectoris   2. Essential hypertension   3. Mixed hyperlipidemia   4. Uncontrolled type 2 diabetes mellitus with insulin therapy ([place])   5. Arterial leg ulcer ([facility])    PLAN:    In order of problems listed above:  1. The patient will continue his medical program which is fairly extensive.  He is on dual antiplatelet therapy with aspirin and ticagrelor.  He is having significant shortness of breath that he relates to ticagrelor.  Now that he is out beyond 3 months from his MI, I feel comfortable changing him to clopidogrel.  He will be loaded with 300 mg tomorrow and start 75 mg daily thereafter.  He will continue on aspirin 81 mg daily.  He will continue on a high intensity statin drug.  I am going to check a Lexiscan Myoview stress test since he has undergone multivessel stenting and is having symptoms of unclear etiology. 2. Blood pressure is controlled on a combination of amlodipine, isosorbide, metoprolol, and ramipril. 3. Treated with Zetia and high-dose atorvastatin.  Most recent lipids reviewed. 4. States that his diabetes is improved.  He is working through his primary care physician and is treated with a  combination of oral hypoglycemics and insulin. 5. His foot ulceration is worrisome for an ischemic ulcer.  He has dependent rubor of the same foot.  I am able to Doppler a posterior tibial pulse with a monophasic waveform but there is no dorsalis pedis pulse.  He will go for formal lower extremity arterial duplex and I will plan to refer him for peripheral vascular consultation.   Medication Adjustments/Labs and Tests Ordered: Current medicines are reviewed at length with the patient today.  Concerns regarding medicines are outlined above.  Orders Placed This Encounter  Procedures  . MYOCARDIAL PERFUSION IMAGING  . VAS [place] LOWER EXTREMITY ARTERIAL DUPLEX   Meds ordered this encounter  Medications  . nitroGLYCERIN (NITROSTAT) 0.4 MG SL tablet    Sig: Place 1 tablet (0.4 mg total) under the tongue every 5 (five) minutes x 3 doses as needed for chest pain.    Dispense:  75 tablet    Refill:  3  . clopidogrel (PLAVIX) 75 MG tablet    Sig: Take 1 tablet (75 mg total) by mouth daily.    Dispense:  90 tablet    Refill:  3    Patient Instructions  Medication Instructions:  1) STOP BRILINTA after your PM dose tonight. 2) START PLAVIX tomorrow morning. For your first  dose, take Plavix 300 mg (4 pills). Then take Plavix 75 mg daily starting 01/03/20. *If you need a refill on your cardiac medications before your next appointment, please call your pharmacy*  Testing/Procedures: Your provider has requested that you have a lexiscan myoview. For further information please visit [email or web address]. Please follow instruction sheet, as given.  Your provider has requested that you have a lower extremity arterial duplex ASAP. During this test, ultrasound is used to evaluate arterial blood flow in the legs. Allow one hour for this exam. There are no restrictions or special instructions.    Follow-Up: Your provider recommends that you schedule a follow-up appointment in 3 months with Richardson Dopp,  PA.  You will be scheduled with Dr. Gwenlyn Found after the ultrasound of your legs is complete.    Signed, Sherren Mocha, MD  01/01/2020 1:31 PM    [place]

## 2020-01-04 ENCOUNTER — Encounter (HOSPITAL_COMMUNITY)
Admission: EM | Disposition: A | Discharge status: Home / Self Care | Payer: Self-pay | Source: Home / Self Care | Attending: Cardiovascular Disease

## 2020-01-04 ENCOUNTER — Emergency Department (HOSPITAL_COMMUNITY): Payer: 59

## 2020-01-04 ENCOUNTER — Inpatient Hospital Stay (HOSPITAL_COMMUNITY): Payer: 59

## 2020-01-04 ENCOUNTER — Encounter (HOSPITAL_COMMUNITY): Payer: Self-pay | Admitting: *Deleted

## 2020-01-04 ENCOUNTER — Other Ambulatory Visit: Payer: Self-pay

## 2020-01-04 ENCOUNTER — Inpatient Hospital Stay (HOSPITAL_COMMUNITY)
Admission: EM | Admit: 2020-01-04 | Discharge: 2020-01-06 | DRG: 246 | Disposition: A | Discharge status: Home / Self Care | Payer: 59 | Attending: Cardiovascular Disease | Admitting: Cardiovascular Disease

## 2020-01-04 DIAGNOSIS — E11621 Type 2 diabetes mellitus with foot ulcer: Secondary | ICD-10-CM | POA: Diagnosis present

## 2020-01-04 DIAGNOSIS — I350 Nonrheumatic aortic (valve) stenosis: Secondary | ICD-10-CM | POA: Diagnosis not present

## 2020-01-04 DIAGNOSIS — I10 Essential (primary) hypertension: Secondary | ICD-10-CM | POA: Diagnosis present

## 2020-01-04 DIAGNOSIS — K219 Gastro-esophageal reflux disease without esophagitis: Secondary | ICD-10-CM | POA: Diagnosis present

## 2020-01-04 DIAGNOSIS — E876 Hypokalemia: Secondary | ICD-10-CM | POA: Diagnosis present

## 2020-01-04 DIAGNOSIS — I340 Nonrheumatic mitral (valve) insufficiency: Secondary | ICD-10-CM | POA: Diagnosis not present

## 2020-01-04 DIAGNOSIS — Z794 Long term (current) use of insulin: Secondary | ICD-10-CM

## 2020-01-04 DIAGNOSIS — R710 Precipitous drop in hematocrit: Secondary | ICD-10-CM | POA: Diagnosis not present

## 2020-01-04 DIAGNOSIS — Z807 Family history of other malignant neoplasms of lymphoid, hematopoietic and related tissues: Secondary | ICD-10-CM

## 2020-01-04 DIAGNOSIS — E1151 Type 2 diabetes mellitus with diabetic peripheral angiopathy without gangrene: Secondary | ICD-10-CM | POA: Diagnosis present

## 2020-01-04 DIAGNOSIS — I493 Ventricular premature depolarization: Secondary | ICD-10-CM | POA: Diagnosis not present

## 2020-01-04 DIAGNOSIS — L97519 Non-pressure chronic ulcer of other part of right foot with unspecified severity: Secondary | ICD-10-CM | POA: Diagnosis present

## 2020-01-04 DIAGNOSIS — E1169 Type 2 diabetes mellitus with other specified complication: Secondary | ICD-10-CM | POA: Diagnosis present

## 2020-01-04 DIAGNOSIS — Z951 Presence of aortocoronary bypass graft: Secondary | ICD-10-CM

## 2020-01-04 DIAGNOSIS — Z8249 Family history of ischemic heart disease and other diseases of the circulatory system: Secondary | ICD-10-CM

## 2020-01-04 DIAGNOSIS — Z85038 Personal history of other malignant neoplasm of large intestine: Secondary | ICD-10-CM

## 2020-01-04 DIAGNOSIS — E1165 Type 2 diabetes mellitus with hyperglycemia: Secondary | ICD-10-CM | POA: Diagnosis present

## 2020-01-04 DIAGNOSIS — I249 Acute ischemic heart disease, unspecified: Secondary | ICD-10-CM | POA: Diagnosis not present

## 2020-01-04 DIAGNOSIS — I200 Unstable angina: Secondary | ICD-10-CM

## 2020-01-04 DIAGNOSIS — I2119 ST elevation (STEMI) myocardial infarction involving other coronary artery of inferior wall: Secondary | ICD-10-CM | POA: Diagnosis present

## 2020-01-04 DIAGNOSIS — Z20822 Contact with and (suspected) exposure to covid-19: Secondary | ICD-10-CM | POA: Diagnosis present

## 2020-01-04 DIAGNOSIS — Z7902 Long term (current) use of antithrombotics/antiplatelets: Secondary | ICD-10-CM

## 2020-01-04 DIAGNOSIS — Z955 Presence of coronary angioplasty implant and graft: Secondary | ICD-10-CM

## 2020-01-04 DIAGNOSIS — T82855A Stenosis of coronary artery stent, initial encounter: Principal | ICD-10-CM | POA: Diagnosis present

## 2020-01-04 DIAGNOSIS — I083 Combined rheumatic disorders of mitral, aortic and tricuspid valves: Secondary | ICD-10-CM | POA: Diagnosis present

## 2020-01-04 DIAGNOSIS — I2510 Atherosclerotic heart disease of native coronary artery without angina pectoris: Secondary | ICD-10-CM | POA: Diagnosis not present

## 2020-01-04 DIAGNOSIS — I214 Non-ST elevation (NSTEMI) myocardial infarction: Secondary | ICD-10-CM

## 2020-01-04 DIAGNOSIS — Z888 Allergy status to other drugs, medicaments and biological substances status: Secondary | ICD-10-CM | POA: Diagnosis not present

## 2020-01-04 DIAGNOSIS — E785 Hyperlipidemia, unspecified: Secondary | ICD-10-CM | POA: Diagnosis present

## 2020-01-04 DIAGNOSIS — I25110 Atherosclerotic heart disease of native coronary artery with unstable angina pectoris: Secondary | ICD-10-CM | POA: Diagnosis present

## 2020-01-04 DIAGNOSIS — I252 Old myocardial infarction: Secondary | ICD-10-CM

## 2020-01-04 DIAGNOSIS — Z8505 Personal history of malignant neoplasm of liver: Secondary | ICD-10-CM | POA: Diagnosis not present

## 2020-01-04 DIAGNOSIS — Y831 Surgical operation with implant of artificial internal device as the cause of abnormal reaction of the patient, or of later complication, without mention of misadventure at the time of the procedure: Secondary | ICD-10-CM | POA: Diagnosis present

## 2020-01-04 DIAGNOSIS — Z9049 Acquired absence of other specified parts of digestive tract: Secondary | ICD-10-CM

## 2020-01-04 DIAGNOSIS — E1142 Type 2 diabetes mellitus with diabetic polyneuropathy: Secondary | ICD-10-CM | POA: Diagnosis present

## 2020-01-04 DIAGNOSIS — Z79899 Other long term (current) drug therapy: Secondary | ICD-10-CM

## 2020-01-04 DIAGNOSIS — Z833 Family history of diabetes mellitus: Secondary | ICD-10-CM

## 2020-01-04 DIAGNOSIS — IMO0002 Reserved for concepts with insufficient information to code with codable children: Secondary | ICD-10-CM | POA: Diagnosis present

## 2020-01-04 DIAGNOSIS — I25810 Atherosclerosis of coronary artery bypass graft(s) without angina pectoris: Secondary | ICD-10-CM | POA: Diagnosis present

## 2020-01-04 DIAGNOSIS — Z7982 Long term (current) use of aspirin: Secondary | ICD-10-CM

## 2020-01-04 DIAGNOSIS — I2584 Coronary atherosclerosis due to calcified coronary lesion: Secondary | ICD-10-CM | POA: Diagnosis present

## 2020-01-04 HISTORY — PX: LEFT HEART CATH AND CORONARY ANGIOGRAPHY: CATH118249

## 2020-01-04 HISTORY — PX: CORONARY STENT INTERVENTION: CATH118234

## 2020-01-04 HISTORY — PX: INTRAVASCULAR ULTRASOUND/IVUS: CATH118244

## 2020-01-04 LAB — ECHOCARDIOGRAM COMPLETE
Height: 64 in
Weight: 3259.28 oz

## 2020-01-04 LAB — CBC WITH DIFFERENTIAL/PLATELET
Abs Immature Granulocytes: 0.05 10*3/uL (ref 0.00–0.07)
Basophils Absolute: 0.1 10*3/uL (ref 0.0–0.1)
Basophils Relative: 1 %
Eosinophils Absolute: 0.1 10*3/uL (ref 0.0–0.5)
Eosinophils Relative: 1 %
HCT: 41.6 % (ref 39.0–52.0)
Hemoglobin: 13.4 g/dL (ref 13.0–17.0)
Immature Granulocytes: 0 %
Lymphocytes Relative: 40 %
Lymphs Abs: 4.6 10*3/uL — ABNORMAL HIGH (ref 0.7–4.0)
MCH: 28.9 pg (ref 26.0–34.0)
MCHC: 32.2 g/dL (ref 30.0–36.0)
MCV: 89.7 fL (ref 80.0–100.0)
Monocytes Absolute: 1.2 10*3/uL — ABNORMAL HIGH (ref 0.1–1.0)
Monocytes Relative: 10 %
Neutro Abs: 5.6 10*3/uL (ref 1.7–7.7)
Neutrophils Relative %: 48 %
Platelets: 314 10*3/uL (ref 150–400)
RBC: 4.64 MIL/uL (ref 4.22–5.81)
RDW: 14.6 % (ref 11.5–15.5)
WBC: 11.7 10*3/uL — ABNORMAL HIGH (ref 4.0–10.5)
nRBC: 0 % (ref 0.0–0.2)

## 2020-01-04 LAB — RESPIRATORY PANEL BY RT PCR (FLU A&B, COVID)
Influenza A by PCR: NEGATIVE
Influenza B by PCR: NEGATIVE
SARS Coronavirus 2 by RT PCR: NEGATIVE

## 2020-01-04 LAB — GLUCOSE, CAPILLARY
Glucose-Capillary: 276 mg/dL — ABNORMAL HIGH (ref 70–99)
Glucose-Capillary: 315 mg/dL — ABNORMAL HIGH (ref 70–99)
Glucose-Capillary: 320 mg/dL — ABNORMAL HIGH (ref 70–99)
Glucose-Capillary: 174 mg/dL — ABNORMAL HIGH (ref 70–99)

## 2020-01-04 LAB — TROPONIN I (HIGH SENSITIVITY)
Troponin I (High Sensitivity): 38 ng/L — ABNORMAL HIGH (ref <18)
Troponin I (High Sensitivity): 556 ng/L (ref <18)
Troponin I (High Sensitivity): 8610 ng/L (ref <18)
Troponin I (High Sensitivity): 8219 ng/L (ref <18)

## 2020-01-04 LAB — BASIC METABOLIC PANEL
Anion gap: 14 (ref 5–15)
BUN: 35 mg/dL — ABNORMAL HIGH (ref 8–23)
CO2: 24 mmol/L (ref 22–32)
Calcium: 9.1 mg/dL (ref 8.9–10.3)
Chloride: 100 mmol/L (ref 98–111)
Creatinine, Ser: 1.24 mg/dL (ref 0.61–1.24)
GFR calc Af Amer: >60 mL/min (ref >60)
GFR calc non Af Amer: >60 mL/min (ref >60)
Glucose, Bld: 235 mg/dL — ABNORMAL HIGH (ref 70–99)
Potassium: 3.1 mmol/L — ABNORMAL LOW (ref 3.5–5.1)
Sodium: 138 mmol/L (ref 135–145)

## 2020-01-04 LAB — CBG MONITORING, ED: Glucose-Capillary: 292 mg/dL — ABNORMAL HIGH (ref 70–99)

## 2020-01-04 LAB — PROTIME-INR
INR: 0.9 (ref 0.8–1.2)
Prothrombin Time: 12.4 seconds (ref 11.4–15.2)

## 2020-01-04 LAB — LIPID PANEL
Cholesterol: 185 mg/dL (ref 0–200)
HDL: 22 mg/dL — ABNORMAL LOW (ref >40)
LDL Cholesterol: 102 mg/dL — ABNORMAL HIGH (ref 0–99)
Total CHOL/HDL Ratio: 8.4 RATIO
Triglycerides: 306 mg/dL — ABNORMAL HIGH (ref <150)
VLDL: 61 mg/dL — ABNORMAL HIGH (ref 0–40)

## 2020-01-04 LAB — POCT ACTIVATED CLOTTING TIME: Activated Clotting Time: 389 seconds

## 2020-01-04 LAB — MAGNESIUM: Magnesium: 2.4 mg/dL (ref 1.7–2.4)

## 2020-01-04 LAB — APTT: aPTT: 31 seconds (ref 24–36)

## 2020-01-04 SURGERY — LEFT HEART CATH AND CORONARY ANGIOGRAPHY
Anesthesia: LOCAL

## 2020-01-04 MED ORDER — AMLODIPINE BESYLATE 10 MG PO TABS
10.0000 mg | ORAL_TABLET | Freq: Every day | ORAL | Status: DC
  Administered 2020-01-04 – 2020-01-06 (×3): 10 mg via ORAL
  Filled 2020-01-04 (×3): qty 1

## 2020-01-04 MED ORDER — ASPIRIN 81 MG PO CHEW
CHEWABLE_TABLET | ORAL | Status: AC
  Filled 2020-01-04: qty 4

## 2020-01-04 MED ORDER — SODIUM CHLORIDE 0.9 % IV SOLN
INTRAVENOUS | Status: AC | PRN
  Administered 2020-01-04: 75 mL/h via INTRAVENOUS

## 2020-01-04 MED ORDER — HYDROCHLOROTHIAZIDE 25 MG PO TABS
25.0000 mg | ORAL_TABLET | ORAL | Status: AC
  Administered 2020-01-04: 25 mg via ORAL
  Filled 2020-01-04: qty 1

## 2020-01-04 MED ORDER — PRASUGREL HCL 10 MG PO TABS
ORAL_TABLET | ORAL | Status: DC | PRN
  Administered 2020-01-04: 60 mg via ORAL

## 2020-01-04 MED ORDER — ACETAMINOPHEN 325 MG PO TABS: 650.0000 mg | ORAL_TABLET | ORAL | Status: DC | PRN

## 2020-01-04 MED ORDER — IOHEXOL 350 MG/ML SOLN
INTRAVENOUS | Status: AC
  Filled 2020-01-04: qty 1

## 2020-01-04 MED ORDER — MIDAZOLAM HCL 2 MG/2ML IJ SOLN
INTRAMUSCULAR | Status: DC | PRN
  Administered 2020-01-04: 2 mg via INTRAVENOUS
  Administered 2020-01-04: 1 mg via INTRAVENOUS

## 2020-01-04 MED ORDER — SODIUM CHLORIDE 0.9 % IV SOLN: 250.0000 mL | INTRAVENOUS | Status: DC | PRN

## 2020-01-04 MED ORDER — HYDROCHLOROTHIAZIDE 25 MG PO TABS
25.0000 mg | ORAL_TABLET | Freq: Every day | ORAL | Status: DC
  Administered 2020-01-04: 25 mg via ORAL
  Filled 2020-01-04 (×2): qty 1

## 2020-01-04 MED ORDER — BIVALIRUDIN TRIFLUOROACETATE 250 MG IV SOLR
INTRAVENOUS | Status: AC
  Filled 2020-01-04: qty 250

## 2020-01-04 MED ORDER — SODIUM CHLORIDE 0.9 % IV SOLN
INTRAVENOUS | Status: DC | PRN
  Administered 2020-01-04: 90 mL/h via INTRAVENOUS

## 2020-01-04 MED ORDER — SODIUM CHLORIDE 0.9 % IV SOLN
INTRAVENOUS | Status: DC | PRN
  Administered 2020-01-04 (×2): 1.75 mg/kg/h via INTRAVENOUS

## 2020-01-04 MED ORDER — LABETALOL HCL 5 MG/ML IV SOLN
INTRAVENOUS | Status: AC
  Filled 2020-01-04: qty 4

## 2020-01-04 MED ORDER — INSULIN ASPART 100 UNIT/ML ~~LOC~~ SOLN
6.0000 [IU] | Freq: Three times a day (TID) | SUBCUTANEOUS | Status: DC
  Administered 2020-01-04 – 2020-01-06 (×5): 6 [IU] via SUBCUTANEOUS

## 2020-01-04 MED ORDER — METOPROLOL SUCCINATE ER 100 MG PO TB24
100.0000 mg | ORAL_TABLET | Freq: Two times a day (BID) | ORAL | Status: DC
  Filled 2020-01-04 (×2): qty 1

## 2020-01-04 MED ORDER — INSULIN ASPART 100 UNIT/ML ~~LOC~~ SOLN
SUBCUTANEOUS | Status: AC
  Filled 2020-01-04: qty 1

## 2020-01-04 MED ORDER — EZETIMIBE 10 MG PO TABS
10.0000 mg | ORAL_TABLET | Freq: Every day | ORAL | Status: DC
  Administered 2020-01-04 – 2020-01-06 (×3): 10 mg via ORAL
  Filled 2020-01-04 (×3): qty 1

## 2020-01-04 MED ORDER — IOHEXOL 350 MG/ML SOLN
INTRAVENOUS | Status: DC | PRN
  Administered 2020-01-04: 130 mL

## 2020-01-04 MED ORDER — HEPARIN (PORCINE) IN NACL 1000-0.9 UT/500ML-% IV SOLN
INTRAVENOUS | Status: AC
  Filled 2020-01-04: qty 500

## 2020-01-04 MED ORDER — SODIUM CHLORIDE 0.9 % IV SOLN: INTRAVENOUS | Status: DC

## 2020-01-04 MED ORDER — HEPARIN (PORCINE) IN NACL 1000-0.9 UT/500ML-% IV SOLN
INTRAVENOUS | Status: DC | PRN
  Administered 2020-01-04 (×2): 500 mL

## 2020-01-04 MED ORDER — SODIUM CHLORIDE 0.9% FLUSH
3.0000 mL | Freq: Two times a day (BID) | INTRAVENOUS | Status: DC
  Administered 2020-01-05 – 2020-01-06 (×3): 3 mL via INTRAVENOUS

## 2020-01-04 MED ORDER — ASPIRIN 81 MG PO CHEW: 81.0000 mg | CHEWABLE_TABLET | ORAL | Status: DC

## 2020-01-04 MED ORDER — SODIUM CHLORIDE 0.9% FLUSH: 3.0000 mL | INTRAVENOUS | Status: DC | PRN

## 2020-01-04 MED ORDER — LABETALOL HCL 5 MG/ML IV SOLN
INTRAVENOUS | Status: DC | PRN
  Administered 2020-01-04: 10 mg via INTRAVENOUS

## 2020-01-04 MED ORDER — SODIUM CHLORIDE 0.9 % WEIGHT BASED INFUSION: 1.0000 mL/kg/h | INTRAVENOUS | Status: DC

## 2020-01-04 MED ORDER — INSULIN DETEMIR 100 UNIT/ML ~~LOC~~ SOLN
30.0000 [IU] | Freq: Every day | SUBCUTANEOUS | Status: DC
  Administered 2020-01-04 – 2020-01-05 (×2): 30 [IU] via SUBCUTANEOUS
  Filled 2020-01-04 (×5): qty 0.3

## 2020-01-04 MED ORDER — METOPROLOL SUCCINATE ER 100 MG PO TB24
100.0000 mg | ORAL_TABLET | Freq: Two times a day (BID) | ORAL | Status: DC
  Administered 2020-01-04 – 2020-01-06 (×4): 100 mg via ORAL
  Filled 2020-01-04: qty 2
  Filled 2020-01-04 (×3): qty 1
  Filled 2020-01-04: qty 2

## 2020-01-04 MED ORDER — ONDANSETRON HCL 4 MG/2ML IJ SOLN: 4.0000 mg | Freq: Four times a day (QID) | INTRAMUSCULAR | Status: DC | PRN

## 2020-01-04 MED ORDER — LIDOCAINE HCL (PF) 1 % IJ SOLN
INTRAMUSCULAR | Status: DC | PRN
  Administered 2020-01-04: 20 mL

## 2020-01-04 MED ORDER — FENTANYL CITRATE (PF) 100 MCG/2ML IJ SOLN
INTRAMUSCULAR | Status: DC | PRN
  Administered 2020-01-04 (×2): 25 ug via INTRAVENOUS

## 2020-01-04 MED ORDER — SODIUM CHLORIDE 0.9 % WEIGHT BASED INFUSION
1.0000 mL/kg/h | INTRAVENOUS | Status: AC
  Administered 2020-01-04: 1 mL/kg/h via INTRAVENOUS

## 2020-01-04 MED ORDER — COQ10 100 MG PO CAPS: 100.0000 mg | ORAL_CAPSULE | Freq: Every day | ORAL | Status: DC

## 2020-01-04 MED ORDER — INSULIN ASPART 100 UNIT/ML ~~LOC~~ SOLN
0.0000 [IU] | Freq: Three times a day (TID) | SUBCUTANEOUS | Status: DC
  Administered 2020-01-04 (×2): 15 [IU] via SUBCUTANEOUS
  Administered 2020-01-05: 11 [IU] via SUBCUTANEOUS
  Administered 2020-01-05: 7 [IU] via SUBCUTANEOUS
  Administered 2020-01-05: 4 [IU] via SUBCUTANEOUS
  Administered 2020-01-06: 11 [IU] via SUBCUTANEOUS

## 2020-01-04 MED ORDER — HEPARIN BOLUS VIA INFUSION
4000.0000 [IU] | Freq: Once | INTRAVENOUS | Status: AC
  Administered 2020-01-04: 4000 [IU] via INTRAVENOUS

## 2020-01-04 MED ORDER — BIVALIRUDIN BOLUS VIA INFUSION - CUPID
INTRAVENOUS | Status: DC | PRN
  Administered 2020-01-04: 69.3 mg via INTRAVENOUS

## 2020-01-04 MED ORDER — NITROGLYCERIN IN D5W 200-5 MCG/ML-% IV SOLN
5.0000 ug/min | INTRAVENOUS | Status: DC
  Administered 2020-01-04: 5 ug/min via INTRAVENOUS
  Filled 2020-01-04: qty 250

## 2020-01-04 MED ORDER — HYDROCHLOROTHIAZIDE 25 MG PO TABS
25.0000 mg | ORAL_TABLET | Freq: Every day | ORAL | Status: DC
  Administered 2020-01-05 – 2020-01-06 (×2): 25 mg via ORAL
  Filled 2020-01-04 (×3): qty 1

## 2020-01-04 MED ORDER — ALUM & MAG HYDROXIDE-SIMETH 200-200-20 MG/5ML PO SUSP: 30.0000 mL | Freq: Once | ORAL | Status: DC

## 2020-01-04 MED ORDER — MIDAZOLAM HCL 2 MG/2ML IJ SOLN
INTRAMUSCULAR | Status: AC
  Filled 2020-01-04: qty 2

## 2020-01-04 MED ORDER — LIDOCAINE VISCOUS HCL 2 % MT SOLN: 15.0000 mL | Freq: Once | OROMUCOSAL | Status: DC

## 2020-01-04 MED ORDER — PANTOPRAZOLE SODIUM 40 MG PO TBEC
40.0000 mg | DELAYED_RELEASE_TABLET | Freq: Every day | ORAL | Status: DC
  Administered 2020-01-04 – 2020-01-06 (×3): 40 mg via ORAL
  Filled 2020-01-04 (×3): qty 1

## 2020-01-04 MED ORDER — HEPARIN (PORCINE) 25000 UT/250ML-% IV SOLN
950.0000 [IU]/h | INTRAVENOUS | Status: DC
  Administered 2020-01-04: 950 [IU]/h via INTRAVENOUS
  Filled 2020-01-04: qty 250

## 2020-01-04 MED ORDER — ASPIRIN EC 81 MG PO TBEC
81.0000 mg | DELAYED_RELEASE_TABLET | Freq: Every day | ORAL | Status: DC
  Administered 2020-01-05 – 2020-01-06 (×2): 81 mg via ORAL
  Filled 2020-01-04 (×2): qty 1

## 2020-01-04 MED ORDER — METOPROLOL SUCCINATE ER 100 MG PO TB24
100.0000 mg | ORAL_TABLET | ORAL | Status: AC
  Administered 2020-01-04: 100 mg via ORAL
  Filled 2020-01-04: qty 1

## 2020-01-04 MED ORDER — ASPIRIN 81 MG PO CHEW: 324.0000 mg | CHEWABLE_TABLET | Freq: Once | ORAL | Status: DC

## 2020-01-04 MED ORDER — HYDRALAZINE HCL 20 MG/ML IJ SOLN: 10.0000 mg | INTRAMUSCULAR | Status: AC | PRN

## 2020-01-04 MED ORDER — PRASUGREL HCL 10 MG PO TABS
ORAL_TABLET | ORAL | Status: AC
  Filled 2020-01-04: qty 6

## 2020-01-04 MED ORDER — PRASUGREL HCL 10 MG PO TABS
10.0000 mg | ORAL_TABLET | Freq: Every day | ORAL | Status: DC
  Administered 2020-01-05 – 2020-01-06 (×2): 10 mg via ORAL
  Filled 2020-01-04 (×2): qty 1

## 2020-01-04 MED ORDER — ASPIRIN 81 MG PO CHEW
CHEWABLE_TABLET | ORAL | Status: DC | PRN
  Administered 2020-01-04: 324 mg via ORAL

## 2020-01-04 MED ORDER — RAMIPRIL 10 MG PO CAPS
10.0000 mg | ORAL_CAPSULE | Freq: Every day | ORAL | Status: DC
  Administered 2020-01-04 – 2020-01-06 (×3): 10 mg via ORAL
  Filled 2020-01-04 (×3): qty 1

## 2020-01-04 MED ORDER — NITROGLYCERIN 0.4 MG SL SUBL
0.4000 mg | SUBLINGUAL_TABLET | SUBLINGUAL | Status: AC | PRN
  Administered 2020-01-04 (×3): 0.4 mg via SUBLINGUAL
  Filled 2020-01-04: qty 1

## 2020-01-04 MED ORDER — POTASSIUM CHLORIDE CRYS ER 20 MEQ PO TBCR
EXTENDED_RELEASE_TABLET | ORAL | Status: AC
  Filled 2020-01-04: qty 2

## 2020-01-04 MED ORDER — OMEGA-3-ACID ETHYL ESTERS 1 G PO CAPS
1.0000 g | ORAL_CAPSULE | Freq: Every day | ORAL | Status: DC
  Administered 2020-01-04 – 2020-01-06 (×3): 1 g via ORAL
  Filled 2020-01-04 (×3): qty 1

## 2020-01-04 MED ORDER — POTASSIUM CHLORIDE 10 MEQ/100ML IV SOLN
10.0000 meq | INTRAVENOUS | Status: AC
  Filled 2020-01-04: qty 100

## 2020-01-04 MED ORDER — POTASSIUM CHLORIDE CRYS ER 20 MEQ PO TBCR
40.0000 meq | EXTENDED_RELEASE_TABLET | Freq: Once | ORAL | Status: AC
  Administered 2020-01-04: 40 meq via ORAL

## 2020-01-04 MED ORDER — ISOSORBIDE MONONITRATE ER 30 MG PO TB24
30.0000 mg | ORAL_TABLET | Freq: Every day | ORAL | Status: DC
  Administered 2020-01-04 – 2020-01-06 (×3): 30 mg via ORAL
  Filled 2020-01-04 (×3): qty 1

## 2020-01-04 MED ORDER — CHLORHEXIDINE GLUCONATE CLOTH 2 % EX PADS
6.0000 | MEDICATED_PAD | Freq: Every day | CUTANEOUS | Status: DC
  Administered 2020-01-04 – 2020-01-06 (×3): 6 via TOPICAL

## 2020-01-04 MED ORDER — SODIUM CHLORIDE 0.9 % WEIGHT BASED INFUSION: 3.0000 mL/kg/h | INTRAVENOUS | Status: DC

## 2020-01-04 MED ORDER — FENTANYL CITRATE (PF) 100 MCG/2ML IJ SOLN
INTRAMUSCULAR | Status: AC
  Filled 2020-01-04: qty 2

## 2020-01-04 MED ORDER — NITROGLYCERIN IN D5W 200-5 MCG/ML-% IV SOLN: 5.0000 ug/min | INTRAVENOUS | Status: DC

## 2020-01-04 MED ORDER — LABETALOL HCL 5 MG/ML IV SOLN: 10.0000 mg | INTRAVENOUS | Status: AC | PRN

## 2020-01-04 MED ORDER — ATORVASTATIN CALCIUM 80 MG PO TABS
80.0000 mg | ORAL_TABLET | Freq: Every day | ORAL | Status: DC
  Administered 2020-01-04 – 2020-01-06 (×3): 80 mg via ORAL
  Filled 2020-01-04 (×3): qty 1

## 2020-01-04 SURGICAL SUPPLY — 26 items
BALLN SAPPHIRE 2.5X12 (BALLOONS) ×2
BALLN SAPPHIRE ~~LOC~~ 2.5X15 (BALLOONS) ×1 IMPLANT
BALLN ~~LOC~~ EMERGE MR 3.5X20 (BALLOONS) ×2
BALLOON SAPPHIRE 2.5X12 (BALLOONS) IMPLANT
BALLOON ~~LOC~~ EMERGE MR 3.5X20 (BALLOONS) IMPLANT
CATH EXPO 5F MPA-1 (CATHETERS) ×1 IMPLANT
CATH INFINITI 5 FR IM (CATHETERS) ×1 IMPLANT
CATH INFINITI 5FR MULTPACK ANG (CATHETERS) ×1 IMPLANT
CATH LAUNCHER 6FR EBU3.5 (CATHETERS) ×1 IMPLANT
CATH OPTICROSS 40MHZ (CATHETERS) ×1 IMPLANT
CATH VISTA GUIDE RCB (CATHETERS) ×1 IMPLANT
DEVICE CLOSURE PERCLS PRGLD 6F (VASCULAR PRODUCTS) IMPLANT
GUIDEWIRE INQWIRE 1.5J.035X260 (WIRE) IMPLANT
INQWIRE 1.5J .035X260CM (WIRE) ×2
KIT ENCORE 26 ADVANTAGE (KITS) ×1 IMPLANT
KIT HEART LEFT (KITS) ×2 IMPLANT
PACK CARDIAC CATHETERIZATION (CUSTOM PROCEDURE TRAY) ×2 IMPLANT
PERCLOSE PROGLIDE 6F (VASCULAR PRODUCTS) ×2
SHEATH PINNACLE 6F 10CM (SHEATH) ×1 IMPLANT
SHEATH PROBE COVER 6X72 (BAG) ×1 IMPLANT
SLED PULL BACK IVUS (MISCELLANEOUS) ×1 IMPLANT
STENT RESOLUTE ONYX3.0X38 (Permanent Stent) ×1 IMPLANT
TRANSDUCER W/STOPCOCK (MISCELLANEOUS) ×2 IMPLANT
TUBING CIL FLEX 10 FLL-RA (TUBING) ×2 IMPLANT
WIRE COUGAR XT STRL 190CM (WIRE) ×1 IMPLANT
WIRE EMERALD 3MM-J .035X150CM (WIRE) ×1 IMPLANT

## 2020-01-04 NOTE — Progress Notes (Signed)
0.9NS  and NTG infusing to left upper chest port a cath

## 2020-01-04 NOTE — H&P (Addendum)
Cardiology Admission History and Physical:   Patient ID: Ian [name] MRN: [id number]; DOB: [birth year]   Admission date: 01/04/2020  Primary Care Provider: Sharilyn Sites, MD Primary Cardiologist: Sherren Mocha, MD   Primary Electrophysiologist:  None    Chief Complaint: chest pain  Patient Profile:   Ian [name] is a 64 y.o. male with extensive CAD admitted with STEMI.  History of Present Illness:   Ian [name] male with a hx of coronary artery disease status post CABG in 2008, metastatic colon cancer to the liver with history of partial colectomy and wedge resection of the liver, type 2 diabetes, hypertension, and peripheral neuropathy.  He was hospitalized in October 2020 with ST elevation infarction treated with primary PCI of the radial artery to PDA graft using 2 drug-eluting stents.  He underwent staged PCI with orbital atherectomy and stenting of a protected left main stem into the circumflex, also treated with 2 overlapping drug-eluting stents.  LV function is preserved by echo assessment.  He was seen back in follow-up September 24, 2019 and was doing well at that time.   Patient saw Dr. Burt Knack Friday, 01/01/2020 and was experiencing some pain in his upper back that bothered him after working hard.  He was also having some chest pain and a difficult time deciphering whether it was similar to his past cardiac pain.  Lexiscan Myoview was ordered.  Brilinta was changed to Plavix on Saturday.  Patient was also having claudication symptoms and and arterial Dopplers show severe  right lower extremity arterial disease.  He has an appointment to see Dr. Gwenlyn Found tomorrow. Patient developed chest tightness into his back and down his left arm at noon yesterday.  He got some relief from sublingual nitroglycerin but pain returned and he came to the emergency room.  He was given IV heparin and finally got some relief after he was given IV nitroglycerin.  EKG shows ST elevation in aVR with ST  depression throughout.  He is currently pain-free now.    Heart Pathway Score:     Past Medical History:  Diagnosis Date  . 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 Colon cancer ([place])   . Complication of anesthesia    atypical pseudo-cholinesterase deficiency  . Coronary artery disease involving native coronary artery of native heart with unstable angina pectoris ([place]) 01/10/2013    A) Mv CAD- CABGx 4 (LIMA-LAD, SVG-rPDA, SVG-OM1, SVG-OM2z);09/14/19: NSTEMI - Cath: p-mRCA, Ost-prox LAD, OM3 & SVG-OM1, SVG-OM2 100% CTO; Patentl LIMA-LAD. m-dLM 95% - p-mCx 90%, m-d Cx 90%. 2 Site DES PCI: LRad-RPDA - prox 95% (Resolute Onyx DES 2.75 x 15) , m-d 99% (Resolute Onyx 2.75 x 22); c) 09/16/19: Orbital Atherectomy LM-p-m LCx - Overlapping DES 2.5 x 34 ( LCx) -> 3 x 30 (LM-LCx)  . Diabetes mellitus without complication ([place])   . Family history of adverse reaction to anesthesia    sister also has atypical pseudo-cholinesterase deficiency  . GERD (gastroesophageal reflux disease)   . History of heart bypass surgery   . Hypertension     Past Surgical History:  Procedure Laterality Date  . COLONOSCOPY N/A 08/29/2018   Procedure: COLONOSCOPY;  Surgeon: Daneil Dolin, MD;  [facility];  Service: Endoscopy;  Laterality: N/A;  9:30  . CORONARY ARTERY BYPASS GRAFT     4 vessels  . CORONARY ATHERECTOMY N/A 09/15/2019   Procedure: CORONARY ATHERECTOMY;  Surgeon: Sherren Mocha, MD;  Location: [place] [facility];  Service: Cardiovascular;  LM-LCx Atherectomy - DES  PCI (*Protected LM)   . CORONARY STENT INTERVENTION N/A 09/15/2019   Procedure: CORONARY STENT INTERVENTION;  Surgeon: Sherren Mocha, MD;  Location: [facility];; Successful orbital atherectomy, PTCA, and overlapping DES treatment of severe calcific stenosis in the left main and left circumflex using a 2.5x34 mm Resolute Onyx DES (left circumflex) and 3.0x30 mm Resolute Onyx DES (overlapped proximally in the LCx extending into  left main)  . CORONARY/GRAFT ACUTE MI REVASCULARIZATION N/A 09/14/2019   Procedure: Coronary/Graft Acute MI Revascularization;  Surgeon: Sherren Mocha, MD;  Location: [place] [facility];  Service: Cardiovascular;  Laterality: N/A;  . heart bypass    . INSERTION OF MESH  02/07/2015   Procedure: INSERTION OF MESH;  Surgeon: Aviva Signs Md, MD;  Location: [facility];  Service: General;;  . LEFT HEART CATH AND CORONARY ANGIOGRAPHY N/A 09/14/2019   Procedure: LEFT HEART CATH AND CORONARY ANGIOGRAPHY;  Surgeon: Sherren Mocha, MD;  Location: [place] [facility];  Service: Cardiovascular;  Laterality: N/A;  . PARTIAL COLECTOMY N/A 09/17/2018   Procedure: PARTIAL COLECTOMY WITH PARTIAL WEDGE RESECTION LIVER METASTASIS;  Surgeon: Aviva Signs, MD;  Location: [facility];  Service: General;  Laterality: N/A;  . POLYPECTOMY  08/29/2018   Procedure: POLYPECTOMY;  Surgeon: Daneil Dolin, MD;  [facility];  Service: Endoscopy;;  . PORTACATH PLACEMENT Left 10/10/2018   Procedure: INSERTION PORT-A-CATH (catheter in left subclavian);  Surgeon: Aviva Signs, MD;  Location: [facility];  Service: General;  Laterality: Left;  . UMBILICAL HERNIA REPAIR N/A 02/07/2015   Procedure: UMBILICAL HERNIORRHAPHY WITH MESH;  Surgeon: Aviva Signs Md, MD;  Location: [facility];  Service: General;  Laterality: N/A;     Medications Prior to Admission: Prior to Admission medications   Medication Sig Start Date End Date Taking? Authorizing Provider  amLODipine (NORVASC) 10 MG tablet TAKE ONE TABLET BY MOUTH ONCE DAILY. 03/23/19   Lockamy, Randi L, NP-C  aspirin EC 81 MG tablet Take 81 mg by mouth daily.    [provider]  atorvastatin (LIPITOR) 80 MG tablet Take 1 tablet (80 mg total) by mouth daily. 09/17/19   Cheryln Manly, NP  clopidogrel (PLAVIX) 75 MG tablet Take 1 tablet (75 mg total) by mouth daily. 01/01/20   Sherren Mocha, MD  Coenzyme Q10 (COQ10) 100 MG CAPS Take 100 mg by mouth daily.    [provider]  diphenhydramine-acetaminophen (TYLENOL PM) 25-500 MG TABS tablet Take 2 tablets by mouth at bedtime.    [provider]  ezetimibe (ZETIA) 10 MG tablet Take 1 tablet (10 mg total) by mouth daily. 09/17/19   Cheryln Manly, NP  FARXIGA 10 MG TABS tablet Take 10 mg by mouth daily.  12/19/18   [provider]  gabapentin (NEURONTIN) 300 MG capsule Take 300 mg by mouth at bedtime.    [provider]  hydrochlorothiazide (HYDRODIURIL) 25 MG tablet Take 1 tablet (25 mg total) by mouth daily. 09/24/19 12/23/19  Bhagat, Crista Luria, PA  insulin aspart protamine- aspart (NOVOLOG MIX 70/30) (70-30) 100 UNIT/ML injection Inject 20 Units into the skin 3 (three) times daily with meals.    [provider]  Insulin Glargine (BASAGLAR KWIKPEN) 100 UNIT/ML SOPN Inject 40 Units into the skin at bedtime.  12/10/18   [provider]  isosorbide mononitrate (IMDUR) 30 MG 24 hr tablet Take 30 mg by mouth daily. 09/12/19   [provider]  JANUVIA 100 MG tablet Take 100 mg by mouth daily.  12/19/18  [provider]  loperamide (IMODIUM A-D) 2 MG tablet Take 2 capsules after first loose stool and 1 capsule after each loose stool thereafter. 10/27/18   Derek Jack, MD  metFORMIN (GLUCOPHAGE) 500 MG tablet Take 500 mg by mouth 2 (two) times daily. 08/27/19   [provider]  metoprolol succinate (TOPROL-XL) 100 MG 24 hr tablet Take 1 tablet (100 mg total) by mouth 2 (two) times daily. Take with or immediately following a meal. 09/16/19   Cheryln Manly, NP  nitroGLYCERIN (NITROSTAT) 0.4 MG SL tablet Place 1 tablet (0.4 mg total) under the tongue every 5 (five) minutes x 3 doses as needed for chest pain. 01/01/20   Sherren Mocha, MD  Omega-3 Fatty Acids (FISH OIL PO) Take 1 capsule by mouth daily.    [provider]  pantoprazole (PROTONIX) 40 MG tablet Take 40 mg by mouth daily.    [provider]  ramipril  (ALTACE) 10 MG capsule Take 10 mg by mouth daily.    [provider]     Allergies:    Allergies  Allergen Reactions  . Anectine [Succinylcholine] Other (See Comments)    Atypical pseudocholinesterase deficiency.   . Nsaids Other (See Comments)    Contraindicated (can only have asa 81mg )    Social History:   Social History   Socioeconomic History  . Marital status: Married    Spouse name: Not on file  . Number of children: 3  . Years of education: Not on file  . Highest education level: Not on file  Occupational History  . Occupation: Programmer, systems: [employer]  Tobacco Use  . Smoking status: Never Smoker  . Smokeless tobacco: Never Used  Substance and Sexual Activity  . Alcohol use: No  . Drug use: No  . Sexual activity: Yes    Birth control/protection: None  Other Topics Concern  . Not on file  Social History Narrative  . Not on file   Social Determinants of Health   Financial Resource Strain:   . Difficulty of Paying Living Expenses: Not on file  Food Insecurity:   . Worried About Charity fundraiser in the Last Year: Not on file  . Ran Out of Food in the Last Year: Not on file  Transportation Needs:   . Lack of Transportation (Medical): Not on file  . Lack of Transportation (Non-Medical): Not on file  Physical Activity:   . Days of Exercise per Week: Not on file  . Minutes of Exercise per Session: Not on file  Stress:   . Feeling of Stress : Not on file  Social Connections:   . Frequency of Communication with Friends and Family: Not on file  . Frequency of Social Gatherings with Friends and Family: Not on file  . Attends Religious Services: Not on file  . Active Member of Clubs or Organizations: Not on file  . Attends Archivist Meetings: Not on file  . Marital Status: Not on file  Intimate Partner Violence:   . Fear of Current or Ex-Partner: Not on file  . Emotionally Abused: Not on file  . Physically  Abused: Not on file  . Sexually Abused: Not on file    Family History:   The patient's family history includes Diabetes in his brother, brother, father, sister, and sister; Heart disease in his brother and father; Hypertension in his brother, father, sister, and son; Lymphoma in his brother.    ROS:  Please  see the history of present illness.  Review of Systems  Constitution: Negative.  HENT: Negative.   Eyes: Negative.   Cardiovascular: Positive for chest pain, claudication and dyspnea on exertion.  Respiratory: Positive for shortness of breath.   Hematologic/Lymphatic: Negative.   Musculoskeletal: Negative.  Negative for joint pain.  Gastrointestinal: Negative.   Genitourinary: Negative.   Neurological: Negative.   All other systems reviewed and are negative. All other ROS reviewed and negative.     Physical Exam/Data:   Vitals:   01/04/20 0700 01/04/20 0730 01/04/20 0745 01/04/20 0800  BP: 133/66 136/74  (!) 145/67  Pulse: 98 (!) 101 97 (!) 104  Resp: 18 16 17 20   Temp:      TempSrc:      SpO2: 96% 97% 94% 97%  Weight:      Height:        Intake/Output Summary (Last 24 hours) at 01/04/2020 0831 Last data filed at 01/04/2020 0321 Gross per 24 hour  Intake --  Output 800 ml  Net -800 ml   Last 3 Weights 01/04/2020 01/01/2020 09/24/2019  Weight (lbs) 203 lb 11.3 oz 203 lb 12.8 oz 196 lb 3.2 oz  Weight (kg) 92.4 kg 92.443 kg 88.996 kg     Body mass index is 34.97 kg/m.  General: Obese, in no acute distress  HEENT: normal Lymph: no adenopathy Neck: no  JVD Endocrine:  No thryomegaly Vascular: No carotid bruits; FA pulses 2+ bilaterally without bruits lower extremities cannot palpate distal pulses Cardiac:  normal S1, S2; RRR; no murmur   Lungs:  clear to auscultation bilaterally, no wheezing, rhonchi or rales  Abd: soft, nontender, no hepatomegaly  Ext: no  edema Musculoskeletal:  No deformities, BUE and BLE strength normal and equal right foot ulcer Skin: warm  and dry  Neuro:  CNs 2-12 intact, no focal abnormalities noted Psych:  Normal affect    EKG:  The ECG that was done  was personally reviewed and demonstrates cardiac 113 bpm with ST elevation in aVR ST depression throughout  Relevant CV Studies: Lower extremity arterial Dopplers 2/12/2021Summary:  Right: Resting right ankle-brachial index indicates severe right lower  extremity arterial disease. The right toe-brachial index is abnormal.   Left: Resting left ankle-brachial index indicates moderate left lower  extremity arterial disease. The left toe-brachial index is abnormal.      *See table(s) above for measurements and observations.     Vascular consult recommended. Appointment with Dr. Gwenlyn Found scheduled  Electronically signed by Jenkins Rouge MD on 01/01/2020 at 2:55:25  PM.01/05/2020 at 3:30PM  Echo 09-14-2019: IMPRESSIONS     1. Left ventricular ejection fraction, by visual estimation, is 60 to  65%. The left ventricle has normal function. Normal left ventricular size.  There is mildly increased left ventricular hypertrophy.   2. Left ventricular diastolic Doppler parameters are consistent with  impaired relaxation pattern of LV diastolic filling.   3. Global right ventricle has normal systolic function.The right  ventricular size is normal. No increase in right ventricular wall  thickness.   4. Left atrial size was normal.   5. Right atrial size was normal.   6. The mitral valve is normal in structure. Mild mitral valve  regurgitation.   7. The tricuspid valve is normal in structure. Tricuspid valve  regurgitation is trivial.   8. The aortic valve is tricuspid Aortic valve regurgitation is trivial by  color flow Doppler. Mild to moderate aortic valve sclerosis/calcification  without any evidence of aortic stenosis.  9. The pulmonic valve was grossly normal. Pulmonic valve regurgitation is  not visualized by color flow Doppler.  10. The inferior vena cava is normal in  size with greater than 50%  respiratory variability, suggesting right atrial pressure of 3 mmHg.    Cardiac Cath 09-14-2019: Conclusion      Prox RCA to Mid RCA lesion is 100% stenosed.  Ost LAD to Prox LAD lesion is 100% stenosed.  Mid LM to Dist LM lesion is 95% stenosed.  Prox Cx to Mid Cx lesion is 90% stenosed.  Mid Cx to Dist Cx lesion is 90% stenosed.  3rd Mrg lesion is 100% stenosed.  Left radial artery.  Prox Graft lesion is 95% stenosed.  Mid Graft to Dist Graft lesion is 99% stenosed.  Origin to Prox Graft lesion is 100% stenosed.  Origin to Prox Graft lesion is 100% stenosed.  LIMA and is large.  A drug-eluting stent was successfully placed using a STENT RESOLUTE ONYX [id number].  Post intervention, there is a 0% residual stenosis.  A drug-eluting stent was successfully placed using a STENT RESOLUTE ONYX [id number].  Post intervention, there is a 0% residual stenosis.   1.  Severe three-vessel coronary artery disease with total occlusion of the RCA, total occlusion of the LAD, severe stenosis of the left main, and severe stenosis of the left circumflex 2.  Status post aortocoronary bypass surgery with chronic occlusion of the saphenous vein graft to OM1, saphenous vein graft to OM 2, continued patency of the LIMA to LAD, and continued patency of the left radial graft to PDA with severe sequential stenoses in the proximal and mid body of the graft. 3.  Successful PCI of the left radial artery to PDA graft using a 2.75 x 22 mm resolute Onyx DES in the mid body of the graft and a 2.75 x 15 mm resolute Onyx DES in the proximal body of the graft   Recommendations: Dual antiplatelet therapy with aspirin and ticagrelor at least 12 months without interruption, aggressive guideline directed medical therapy, staged PCI of the left main and left circumflex which is no longer protected because of saphenous vein graft occlusion.  The left main is heavily calcified and I think the  patient will require atherectomy to facilitate stent expansion through this area.  If he has no procedural/post MI complications, would tentatively proceed on Tuesday.    Coronary Diagrams   Diagnostic Dominance: Right  Intervention    Implants    Permanent Stent  Stent Resolute Onyx 2.75x22 - [id number] - Implanted  Inventory item: Loma Sousa 9.44H67 Model/Cat number: [id number]      PCI 09-15-2019: Conclusion   Successful orbital atherectomy, PTCA, and overlapping DES treatment of severe calcific stenosis in the left main and left circumflex using a 2.5x34 mm Resolute Onyx DES (left circumflex) and 3.0x30 mm Resolute Onyx DES (overlapped proximally in the LCx extending into left main)  Recommendations   Antiplatelet/Anticoag Recommend uninterrupted dual antiplatelet therapy with Aspirin 81mg  daily and Ticagrelor 90mg  twice daily for a minimum of 12 months (ACS-Class I recommendation).        Laboratory Data:  High Sensitivity Troponin:   Recent Labs  Lab 01/04/20 0120 01/04/20 0329  TROPONINIHS 38* 556*      Chemistry Recent Labs  Lab 01/04/20 0120  NA 138  K 3.1*  CL 100  CO2 24  GLUCOSE 235*  BUN 35*  CREATININE 1.24  CALCIUM 9.1  GFRNONAA >60  GFRAA >60  ANIONGAP 14  No results for input(s): PROT, ALBUMIN, AST, ALT, ALKPHOS, BILITOT in the last 168 hours. Hematology Recent Labs  Lab 01/04/20 0120  WBC 11.7*  RBC 4.64  HGB 13.4  HCT 41.6  MCV 89.7  MCH 28.9  MCHC 32.2  RDW 14.6  PLT 314   BNPNo results for input(s): BNP, PROBNP in the last 168 hours.  DDimer No results for input(s): DDIMER in the last 168 hours.   Radiology/Studies:  DG Chest Port 1 View  Result Date: 01/04/2020 CLINICAL DATA:  Left-sided chest pain radiating to the left arm. Shortness of breath. EXAM: PORTABLE CHEST 1 VIEW COMPARISON:  09/13/2019 FINDINGS: Left subclavian power port with its tip in the SVC at the azygos level. Previous median sternotomy and  CABG. Chronic aortic atherosclerosis. Probable pulmonary venous hypertension, possibly with early interstitial edema. No alveolar edema. No consolidation, collapse or effusion. IMPRESSION: Previous CABG. Pulmonary venous hypertension with early interstitial edema. Electronically Signed   By: Nelson Chimes M.D.   On: 01/04/2020 01:44       TIMI Risk Score for ST  Elevation MI:   The patient's TIMI risk score is  , which indicates a  % risk of all cause mortality at 30 days.    Assessment and Plan:   STEMI with ST elevation in aVR and ST depression throughout, troponins over 500.  Currently pain-free on IV heparin and nitroglycerin.  Transferring to [facility] for emergency cardiac catheterization with Dr. Burt Knack.I have reviewed the risks, indications, and alternatives to angioplasty and stenting with the patient. Risks include but are not limited to bleeding, infection, vascular injury, stroke, myocardial infection, arrhythmia, kidney injury, radiation-related injury in the case of prolonged fluoroscopy use, emergency cardiac surgery, and death. The patient understands the risks of serious complication is low (<1%) and patient agrees to proceed.   CAD status post CABG in 2008, STEMI 08/2019 treated with PCI of the radial artery to PDA graft using 2 drug-eluting stents, underwent staged PCI with orbital atherectomy and stenting of a protected left main stem into the circumflex also treated with 2 overlapping DES.  LV function preserved by echo.  Changed to Plavix on Saturday with a Plavix load.  Essential hypertension controlled on amlodipine, Imdur, metoprolol and ramipril  Hyperlipidemia on Zetia and high-dose statin Direct LDL 141 09/14/19  Diabetes mellitus  PAD with severe lower extremity arterial disease foot ulceration and dependent rubor.  Has appointment with Dr. Gwenlyn Found tomorrow that will need to be changed.    Severity of Illness: The appropriate patient status for this patient is  OBSERVATION. Observation status is judged to be reasonable and necessary in order to provide the required intensity of service to ensure the patient's safety. The patient's presenting symptoms, physical exam findings, and initial radiographic and laboratory data in the context of their medical condition is felt to place them at decreased risk for further clinical deterioration. Furthermore, it is anticipated that the patient will be medically stable for discharge from the hospital within 2 midnights of admission. The following factors support the patient status of observation.   " The patient's presenting symptoms include chest pain. " The physical exam findings include right foot ulcer. " The initial radiographic and laboratory data are STEMI     For questions or updates, please contact [place] Please consult [email or web address] for contact info under        Signed, Ermalinda Barrios, PA-C  01/04/2020 8:31 AM   The patient was seen and examined, and I agree with  the history, physical exam, assessment and plan as documented above, with modifications as noted below. I have also personally reviewed all relevant documentation, old records, labs, and both radiographic and cardiovascular studies. I have also independently interpreted old and new ECG's.  Briefly, this is a 64 yr old male with aforementioned complex CV history (CABG in 2008, PCI in 08/2019 and multivessel stenting with details above, PAD) who was evaluated by Dr. Burt Knack in the office on 2/12. He had some back and chest pain but it was uncertain if this represented anginal pain (pain was non-exertional) and a stress test was ordered. Brilinta was switched to clopidogrel with 300 mg loading dose. He then began experiencing chest tightness radiating into his back on 2/14 afternoon relieved with SL nitro. However when symptoms returned he presented to the [facility] ED. He was given IV heparin and IV nitro. He is currently symptom free.  ECG which  I personally reviewed demonstrates 3 mm ST elevation in aVR and up to 3 mm ST depressions inferiorly and in precordial leads.  Patient is being transferred emergently to the cardiac catheterization lab at [facility] for urgent coronary angiography with probable PCI.  Kate Sable, MD, [facility]  01/04/2020 9:17 AM

## 2020-01-04 NOTE — ED Notes (Signed)
Date and time results received: 01/04/20 04:49 (use smartphrase ".now" to insert current time)  Test: trop  Critical Value: 556  Name of Provider Notified: Dr Christy Gentles  Orders Received? Or Actions Taken?: see emr

## 2020-01-04 NOTE — ED Triage Notes (Signed)
Pt c/o left side chest pain that radiates to left arm with sob that started earlier today, pt reports that he has taken about 10 nitro that would help with pain but it would return along with 81 mg aspirin prior to arrival in er,

## 2020-01-04 NOTE — ED Provider Notes (Signed)
[facility] EMERGENCY DEPARTMENT Provider Note   CSN: [id number] Arrival date & time: 01/04/20  0056     History Chief Complaint  Patient presents with  . Chest Pain    Ian [name] is a 64 y.o. male.  The history is provided by the patient.  Chest Pain Pain location:  L chest Pain quality: sharp   Pain radiates to:  Upper back Pain severity:  Severe Onset quality:  Gradual Duration:  12 hours Timing:  Intermittent Progression:  Worsening Chronicity:  New Relieved by:  Nitroglycerin Worsened by:  Nothing Associated symptoms: nausea   Associated symptoms: no abdominal pain, no cough, no fever, no shortness of breath, no syncope and no vomiting   Risk factors: coronary artery disease   Patient with extensive history including coronary artery disease, diabetes, previous colon cancer presents with chest pain.  He reports over the past 12 hours he has had left-sided chest pain that radiates to his back and left arm with some numbness in left arm.  It has responded to nitroglycerin.  However the pain keeps returning.  This feels similar to prior angina type pain.  He was recently switched to Plavix after taking Brilinta. No pleuritic pain.  No cough or fever.  He denies shortness of breath     Past Medical History:  Diagnosis Date  . 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 Colon cancer ([place])   . Complication of anesthesia    atypical pseudo-cholinesterase deficiency  . Coronary artery disease involving native coronary artery of native heart with unstable angina pectoris ([place]) 01/10/2013    A) Mv CAD- CABGx 4 (LIMA-LAD, SVG-rPDA, SVG-OM1, SVG-OM2z);09/14/19: NSTEMI - Cath: p-mRCA, Ost-prox LAD, OM3 & SVG-OM1, SVG-OM2 100% CTO; Patentl LIMA-LAD. m-dLM 95% - p-mCx 90%, m-d Cx 90%. 2 Site DES PCI: LRad-RPDA - prox 95% (Resolute Onyx DES 2.75 x 15) , m-d 99% (Resolute Onyx 2.75 x 22); c) 09/16/19: Orbital Atherectomy LM-p-m LCx - Overlapping DES 2.5 x 34 ( LCx) -> 3 x 30 (LM-LCx)    . Diabetes mellitus without complication ([place])   . Family history of adverse reaction to anesthesia    sister also has atypical pseudo-cholinesterase deficiency  . GERD (gastroesophageal reflux disease)   . History of heart bypass surgery   . Hypertension     Patient Active Problem List   Diagnosis Date Noted  . Presence of drug-eluting stent in RCA, LM-LCx 09/15/2019  . NSTEMI (non-ST elevated myocardial infarction) ([place]) 09/14/2019  . S/P partial colectomy 09/17/2018  . Malignant neoplasm of sigmoid colon ([place])   . Liver mass, right lobe   . Dysfunctional ventilatory weaning response ([place]) 02/07/2015  . Palpitations, recurrent 01/10/2013  . Obesity 01/10/2013  . Essential hypertension 01/10/2013  . Coronary artery disease involving native coronary artery of native heart with unstable angina pectoris ([place]) 01/10/2013  . Hyperlipidemia associated with type 2 diabetes mellitus ([place]) 01/10/2013  . OSA (obstructive sleep apnea), probable 01/10/2013  . Diabetes mellitus type II, uncontrolled ([place]) 01/10/2013  . 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 Chest tightness 01/10/2013    Past Surgical History:  Procedure Laterality Date  . COLONOSCOPY N/A 08/29/2018   Procedure: COLONOSCOPY;  Surgeon: Daneil Dolin, MD;  Location[facility];  Service: Endoscopy;  Laterality: N/A;  9:30  . CORONARY ARTERY BYPASS GRAFT     4 vessels  . CORONARY ATHERECTOMY N/A 09/15/2019   Procedure: CORONARY ATHERECTOMY;  Surgeon: Sherren Mocha, MD;  Location: [place] CV LAB;  Service: Cardiovascular;  LM-LCx Atherectomy - DES PCI (*Protected LM)   . CORONARY STENT INTERVENTION N/A 09/15/2019   Procedure: CORONARY STENT INTERVENTION;  Surgeon: Sherren Mocha, MD;  Location: [place] CV LAB;; Successful orbital atherectomy, PTCA, and overlapping DES treatment of severe calcific stenosis in the left main and left circumflex using a 2.5x34 mm Resolute Onyx DES (left circumflex) and 3.0x30 mm  Resolute Onyx DES (overlapped proximally in the LCx extending into left main)  . CORONARY/GRAFT ACUTE MI REVASCULARIZATION N/A 09/14/2019   Procedure: Coronary/Graft Acute MI Revascularization;  Surgeon: Sherren Mocha, MD;  Location: [place] CV LAB;  Service: Cardiovascular;  Laterality: N/A;  . heart bypass    . INSERTION OF MESH  02/07/2015   Procedure: INSERTION OF MESH;  Surgeon: Aviva Signs Md, MD;  Location: [facility] ORS;  Service: General;;  . LEFT HEART CATH AND CORONARY ANGIOGRAPHY N/A 09/14/2019   Procedure: LEFT HEART CATH AND CORONARY ANGIOGRAPHY;  Surgeon: Sherren Mocha, MD;  Location: [place] CV LAB;  Service: Cardiovascular;  Laterality: N/A;  . PARTIAL COLECTOMY N/A 09/17/2018   Procedure: PARTIAL COLECTOMY WITH PARTIAL WEDGE RESECTION LIVER METASTASIS;  Surgeon: Aviva Signs, MD;  Location: [facility] OR[facility];  Service: General;  Laterality: N/A;  . POLYPECTOMY  08/29/2018   Procedure: POLYPECTOMY;  Surgeon: Daneil Dolin, MD;  [facility];  Service: Endoscopy;;  . PORTACATH PLACEMENT Left 10/10/2018   Procedure: INSERTION PORT-A-CATH (catheter in left subclavian);  Surgeon: Aviva Signs, MD;  Location: [facility] OR[facility];  Service: General;  Laterality: Left;  . UMBILICAL HERNIA REPAIR N/A 02/07/2015   Procedure: UMBILICAL HERNIORRHAPHY WITH MESH;  Surgeon: Aviva Signs Md, MD;  Location: [facility] OR[facility];  Service: General;  Laterality: N/A;       Family History  Problem Relation Age of Onset  . Hypertension Father   . Diabetes Father   . Heart disease Father   . Diabetes Sister   . Hypertension Sister   . Hypertension Brother   . Diabetes Brother   . Diabetes Sister   . Diabetes Brother   . Heart disease Brother   . Lymphoma Brother   . Hypertension Son     Social History   Tobacco Use  . Smoking status: Never Smoker  . Smokeless tobacco: Never Used  Substance Use Topics  . Alcohol use: No  . Drug use: No    Home Medications Prior to Admission medications     Medication Sig Start Date End Date Taking? Authorizing Provider  amLODipine (NORVASC) 10 MG tablet TAKE ONE TABLET BY MOUTH ONCE DAILY. 03/23/19   Lockamy, Randi L, NP-C  aspirin EC 81 MG tablet Take 81 mg by mouth daily.    [provider]  atorvastatin (LIPITOR) 80 MG tablet Take 1 tablet (80 mg total) by mouth daily. 09/17/19   Cheryln Manly, NP  clopidogrel (PLAVIX) 75 MG tablet Take 1 tablet (75 mg total) by mouth daily. 01/01/20   Sherren Mocha, MD  Coenzyme Q10 (COQ10) 100 MG CAPS Take 100 mg by mouth daily.    [provider]  diphenhydramine-acetaminophen (TYLENOL PM) 25-500 MG TABS tablet Take 2 tablets by mouth at bedtime.    [provider]  ezetimibe (ZETIA) 10 MG tablet Take 1 tablet (10 mg total) by mouth daily. 09/17/19   Cheryln Manly, NP  FARXIGA 10 MG TABS tablet Take 10 mg by mouth daily.  12/19/18   [provider]  gabapentin (NEURONTIN) 300 MG capsule Take 300 mg by mouth at bedtime.  [provider]  hydrochlorothiazide (HYDRODIURIL) 25 MG tablet Take 1 tablet (25 mg total) by mouth daily. 09/24/19 12/23/19  Bhagat, Crista Luria, PA  insulin aspart protamine- aspart (NOVOLOG MIX 70/30) (70-30) 100 UNIT/ML injection Inject 20 Units into the skin 3 (three) times daily with meals.    [provider]  Insulin Glargine (BASAGLAR KWIKPEN) 100 UNIT/ML SOPN Inject 40 Units into the skin at bedtime.  12/10/18   [provider]  isosorbide mononitrate (IMDUR) 30 MG 24 hr tablet Take 30 mg by mouth daily. 09/12/19   [provider]  JANUVIA 100 MG tablet Take 100 mg by mouth daily.  12/19/18   [provider]  loperamide (IMODIUM A-D) 2 MG tablet Take 2 capsules after first loose stool and 1 capsule after each loose stool thereafter. 10/27/18   Derek Jack, MD  metFORMIN (GLUCOPHAGE) 500 MG tablet Take 500 mg by mouth 2 (two) times daily. 08/27/19   [provider]  metoprolol  succinate (TOPROL-XL) 100 MG 24 hr tablet Take 1 tablet (100 mg total) by mouth 2 (two) times daily. Take with or immediately following a meal. 09/16/19   Cheryln Manly, NP  nitroGLYCERIN (NITROSTAT) 0.4 MG SL tablet Place 1 tablet (0.4 mg total) under the tongue every 5 (five) minutes x 3 doses as needed for chest pain. 01/01/20   Sherren Mocha, MD  Omega-3 Fatty Acids (FISH OIL PO) Take 1 capsule by mouth daily.    [provider]  pantoprazole (PROTONIX) 40 MG tablet Take 40 mg by mouth daily.    [provider]  ramipril (ALTACE) 10 MG capsule Take 10 mg by mouth daily.    [provider]    Allergies    Anectine [succinylcholine] and Nsaids  Review of Systems   Review of Systems  Constitutional: Negative for fever.  Respiratory: Negative for cough and shortness of breath.   Cardiovascular: Positive for chest pain. Negative for syncope.  Gastrointestinal: Positive for nausea. Negative for abdominal pain and vomiting.  All other systems reviewed and are negative.   Physical Exam Updated Vital Signs BP (!) 192/93   Pulse (!) 111   Temp 98.3 F (36.8 C) (Oral)   Resp 20   Ht 1.626 m (5\' 4" )   Wt 92.4 kg   SpO2 96%   BMI 34.97 kg/m   Physical Exam CONSTITUTIONAL: Well developed/well nourished, uncomfortable appearing HEAD: Normocephalic/atraumatic EYES: EOMI ENMT: Mucous membranes moist NECK: supple no meningeal signs SPINE/BACK:entire spine nontender CV: S1/S2 noted, no murmurs/rubs/gallops noted LUNGS: Lungs are clear to auscultation bilaterally, no apparent distress ABDOMEN: soft, nontender, no rebound or guarding, bowel sounds noted throughout abdomen GU:no cva tenderness NEURO: Pt is awake/alert/appropriate, moves all extremitiesx4.  No facial droop.   EXTREMITIES: pulses normal/equalx4, full ROM, no lower extremity edema SKIN: warm, color normal, port in chest PSYCH: no abnormalities of mood noted, alert and oriented to  situation  ED Results / Procedures / Treatments   Labs (all labs ordered are listed, but only abnormal results are displayed) Labs Reviewed  BASIC METABOLIC PANEL - Abnormal; Notable for the following components:      Result Value   Potassium 3.1 (*)    Glucose, Bld 235 (*)    BUN 35 (*)    All other components within normal limits  CBC WITH DIFFERENTIAL/PLATELET - Abnormal; Notable for the following components:   WBC 11.7 (*)    Lymphs Abs 4.6 (*)    Monocytes Absolute 1.2 (*)  All other components within normal limits  TROPONIN I (HIGH SENSITIVITY) - Abnormal; Notable for the following components:   Troponin I (High Sensitivity) 38 (*)    All other components within normal limits  TROPONIN I (HIGH SENSITIVITY) - Abnormal; Notable for the following components:   Troponin I (High Sensitivity) 556 (*)    All other components within normal limits  RESPIRATORY PANEL BY RT PCR (FLU A&B, COVID)  APTT  PROTIME-INR    EKG EKG Interpretation  Date/Time:  Monday January 04 2020 01:10:16 EST Ventricular Rate:  113 PR Interval:    QRS Duration: 107 QT Interval:  315 QTC Calculation: 432 R Axis:   125 Text Interpretation: Sinus tachycardia Right axis deviation Repol abnrm, severe global ischemia (LM/MVD) Confirmed by Ripley Fraise [phone]) on 01/04/2020 1:16:21 AM   Radiology DG Chest Port 1 View  Result Date: 01/04/2020 CLINICAL DATA:  Left-sided chest pain radiating to the left arm. Shortness of breath. EXAM: PORTABLE CHEST 1 VIEW COMPARISON:  09/13/2019 FINDINGS: Left subclavian power port with its tip in the SVC at the azygos level. Previous median sternotomy and CABG. Chronic aortic atherosclerosis. Probable pulmonary venous hypertension, possibly with early interstitial edema. No alveolar edema. No consolidation, collapse or effusion. IMPRESSION: Previous CABG. Pulmonary venous hypertension with early interstitial edema. Electronically Signed   By: Nelson Chimes M.D.   On:  01/04/2020 01:44    Procedures .Critical Care Performed by: Ripley Fraise, MD Authorized by: Ripley Fraise, MD   Critical care provider statement:    Critical care time (minutes):  45   Critical care start time:  01/04/2020 4:00 AM   Critical care end time:  01/04/2020 4:45 AM   Critical care time was exclusive of:  Separately billable procedures and treating other patients   Critical care was necessary to treat or prevent imminent or life-threatening deterioration of the following conditions:  Cardiac failure and circulatory failure   Critical care was time spent personally by me on the following activities:  Ordering and review of laboratory studies, ordering and review of radiographic studies, pulse oximetry, re-evaluation of patient's condition, evaluation of patient's response to treatment, examination of patient, discussions with consultants and review of old charts     Medications Ordered in ED Medications  heparin bolus via infusion 4,000 Units (4,000 Units Intravenous Bolus from Bag 01/04/20 0238)    Followed by  heparin ADULT infusion 100 units/mL (25000 units/266mL sodium chloride 0.45%) (950 Units/hr Intravenous New Bag/Given 01/04/20 0239)  nitroGLYCERIN 50 mg in dextrose 5 % 250 mL (0.2 mg/mL) infusion (20 mcg/min Intravenous Rate/Dose Change 01/04/20 0358)  nitroGLYCERIN (NITROSTAT) SL tablet 0.4 mg (0.4 mg Sublingual Given 01/04/20 0245)    ED Course  I have reviewed the triage vital signs and the nursing notes.  Pertinent labs & imaging results that were available during my care of the patient were reviewed by me and considered in my medical decision making (see chart for details).    MDM Rules/Calculators/A&P                      1:33 AM Patient with known history of CAD presents left-sided chest pain.  He is tachycardic, with some ST depression but no STEMI Cardiac work-up is initiated.  He denies pleuritic pain at this time. He has pulses in all 4  extremities.  However I have difficulty obtaining pulses in his feet, recently found to have severe peripheral vascular disease.  No new weakness/numbness in his legs  However he has an easily obtained femoral pulse bilaterally Patient is already taken Plavix today as well as taking aspirin.  Will provide nitroglycerin Labs and x-ray pending 1:54 AM Pt with continued CP Repeat EKG  EKG Interpretation  Date/Time:  Monday January 04 2020 01:42:24 EST Ventricular Rate:  112 PR Interval:    QRS Duration: 115 QT Interval:  414 QTC Calculation: 566 R Axis:   131 Text Interpretation: Junctional tachycardia Nonspecific intraventricular conduction delay Lateral infarct, age indeterminate Repol abnrm, severe global ischemia (LM/MVD) Confirmed by Ripley Fraise [phone]) on 01/04/2020 1:54:15 AM      2:41 AM Patient is feeling improved He appears improved. Discussed the case w/ cardiology fellow on call We discussed recent cath (08/2019) ekg findings and troponin Since pt is improving and likely needs aggressive medical management at this point will monitor in the ED and recheck troponin.  If he continues to improve he can be managed at this hospital and seen by cardiology  Pt reports this is similar to prior angina, denies pleuritic CP and good improvement with NTG Likely ACS at this time Denies known history of PE. 5:51 AM Repeat troponin has increased, the patient feels much improved with nitroglycerin drip He is resting comfortably.  His vitals are improving Plan will be to do admit to [facility]. Discussed with hospitalist Dr. Josph Macho Patient will be admitted to the medical service with cardiology consultation. Final Clinical Impression(s) / ED Diagnoses Final diagnoses:  Unstable angina [facility])    Rx / DC Orders ED Discharge Orders    None       Ripley Fraise, MD 01/04/20 [phone]

## 2020-01-04 NOTE — ED Notes (Signed)
Pt states that his chest pain has increased, repeat ekg performed, given to Dr Kandis Mannan, additional orders given,

## 2020-01-04 NOTE — Progress Notes (Signed)
ANTICOAGULATION CONSULT NOTE - Preliminary  Pharmacy Consult for heparin Indication: chest pain/ACS  Allergies  Allergen Reactions  . Anectine [Succinylcholine] Other (See Comments)    Atypical pseudocholinesterase deficiency.   . Nsaids Other (See Comments)    Contraindicated (can only have asa 81mg )    Patient Measurements: Height: 5\' 4"  (162.6 cm) Weight: 203 lb 11.3 oz (92.4 kg) IBW/kg (Calculated) : 59.2 HEPARIN DW (KG): 79.5   Vital Signs: Temp: 98.3 F (36.8 C) (02/15 0116) Temp Source: Oral (02/15 0116) BP: 153/74 (02/15 0200) Pulse Rate: 108 (02/15 0200)  Labs: Recent Labs    01/04/20 0120  HGB 13.4  HCT 41.6  PLT 314  APTT 31  LABPROT 12.4  INR 0.9  CREATININE 1.24   Estimated Creatinine Clearance: 63.3 mL/min (by C-G formula based on SCr of 1.24 mg/dL).  Medical History: Past Medical History:  Diagnosis Date  . 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 Colon cancer ([place])   . Complication of anesthesia    atypical pseudo-cholinesterase deficiency  . Coronary artery disease involving native coronary artery of native heart with unstable angina pectoris ([place]) 01/10/2013    A) Mv CAD- CABGx 4 (LIMA-LAD, SVG-rPDA, SVG-OM1, SVG-OM2z);09/14/19: NSTEMI - Cath: p-mRCA, Ost-prox LAD, OM3 & SVG-OM1, SVG-OM2 100% CTO; Patentl LIMA-LAD. m-dLM 95% - p-mCx 90%, m-d Cx 90%. 2 Site DES PCI: LRad-RPDA - prox 95% (Resolute Onyx DES 2.75 x 15) , m-d 99% (Resolute Onyx 2.75 x 22); c) 09/16/19: Orbital Atherectomy LM-p-m LCx - Overlapping DES 2.5 x 34 ( LCx) -> 3 x 30 (LM-LCx)  . Diabetes mellitus without complication ([place])   . Family history of adverse reaction to anesthesia    sister also has atypical pseudo-cholinesterase deficiency  . GERD (gastroesophageal reflux disease)   . History of heart bypass surgery   . Hypertension     Medications:  Infusions:  heparin gtt  Assessment: 64 yo male with hx of CAD s/p CABG in 2008, MI in October 2020 treated with PCI and 2  drug eluding stents. Is being admitted today for chest pain.  Pharmacy has been asked to dose heparin. No anticoagulants PTA, verified wt and height.    Goal of Therapy:  Heparin level 0.3-0.7 units/ml   Plan:  Give 4000 units bolus x 1 Start heparin infusion at 950 units/hr Check anti-Xa level in 6 hours and daily while on heparin Continue to monitor H&H and platelets Preliminary review of pertinent patient information completed.  Forestine Na clinical pharmacist will complete review during morning rounds to assess the patient and finalize treatment regimen.  Nyra Capes, [facility] 01/04/2020,2:22 AM

## 2020-01-04 NOTE — Plan of Care (Signed)

## 2020-01-04 NOTE — ED Notes (Signed)
Pt states that his pain will decrease with the sl nitro but then will return again, Dr Christy Gentles notified, additional orders given,

## 2020-01-04 NOTE — H&P (Signed)
TRH H&P    Patient Demographics:    Ian [name], is a 64 y.o. male  MRN: [id number]  DOB - Feb 27, 1956  Admit Date - 01/04/2020  Referring MD/NP/PA: Dr. Christy Gentles  Outpatient Primary MD for the patient is Sharilyn Sites, MD  Patient coming from: Home  Chief complaint- Chest pain   HPI:    Ian [name]  is a 64 y.o. male, with history of hypertension, CABG, GERD, diabetes mellitus, and cancer presents to the ED with "heart attack pains."  Patient reports that the pain started 12 PM on January 03, 2020.  He was sitting in his chair when the pain started.  He describes them as sharp pain in his chest that radiate down his arm.  The pain in his arm is like a tightening.  The pain does also radiate through to his back.  He had a heart attack with a cardiac cath and 2 stents placed 3 months ago-these pains feel like that pain.  He took sublingual nitro at home and the pain would go away for 30 minutes and then it would come back.  Patient did take sublingual nitro again the same thing would happen.  He repeated that cycle several times before coming into the ER.  Patient has no associated palpitations, vomiting, diaphoresis but does have associated shortness of breath and nausea.  Patient reports the symptoms are worse with exertion.  They are not better with rest but they do improve with nitro.  Patient reports no particular mental stressors yesterday.  Patient reports that since he has been in the ER on the nitro drip the pain has not returned.  Cardiac cath: October 2020 -drug-eluting stent treatment of severe calcific stenosis in left main and left circumflex using 2.5 x 34 mm resolute Onyx drug-eluting stent and 3.0 x 30 mm resolute Onyx drug-eluting stent.  Patient was recommended to continue antiplatelet therapy for 1 year.  He was discharged with aspirin and Brilinta.  Just last week he was switched from Brilinta to  Plavix.  Patient reports compliance with these medications.  Patient's last cardiac echo was in October 2020 as well.  It shows an ejection fraction of 60 to 65%, with LVH.  It shows impaired LV diastolic filling as well.  Patient has diabetic foot ulcer on right great toe.  Patient reports is been present for 6 months.  He reports that he was taking chemo for what he reports is liver cancer but medical record sites colon cancer, and his skin became very cracked.  One of the cracks in his great toe just became larger and larger until it was an ulcer.  He recently showed to his heart doctor who sent him for vascular ultrasound studies.  Patient states that he was told that he has 2 blockages that will require intervention.  In the ED Temperature 98.3, pulse 101-124, respiratory rate 23, blood pressure 192/93 Troponin increased from 38-556 EKG shows heart rate 116, sinus tach, ST depression in the inferior and lateral leads, QTC 441 Hypokalemia with a potassium of 3.1  Elevated creatinine at 1.24 Hyperglycemia at 235 Leukocytosis at 11.7 Covid negative Cardiology consulted Patient started on nitro and heparin drips Plan to see cardiology in the a.m.    Review of systems:    In addition to the HPI above,  No Fever-chills, No Headache, No changes with Vision or hearing, No problems swallowing food or Liquids, + chest pain, + dyspnea, no cough No Abdominal pain, No Nausea or Vomiting, bowel movements are regular, No Blood in stool or Urine, No dysuria, No new skin rashes or bruises, No new joints pains-aches,  No new weakness, tingling, numbness in any extremity, No recent weight gain or loss, No polyuria, polydypsia or polyphagia, No significant Mental Stressors.  All other systems reviewed and are negative.    Past History of the following :    Past Medical History:  Diagnosis Date  . 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 Colon cancer ([place])   . Complication of anesthesia      atypical pseudo-cholinesterase deficiency  . Coronary artery disease involving native coronary artery of native heart with unstable angina pectoris ([place]) 01/10/2013    A) Mv CAD- CABGx 4 (LIMA-LAD, SVG-rPDA, SVG-OM1, SVG-OM2z);09/14/19: NSTEMI - Cath: p-mRCA, Ost-prox LAD, OM3 & SVG-OM1, SVG-OM2 100% CTO; Patentl LIMA-LAD. m-dLM 95% - p-mCx 90%, m-d Cx 90%. 2 Site DES PCI: LRad-RPDA - prox 95% (Resolute Onyx DES 2.75 x 15) , m-d 99% (Resolute Onyx 2.75 x 22); c) 09/16/19: Orbital Atherectomy LM-p-m LCx - Overlapping DES 2.5 x 34 ( LCx) -> 3 x 30 (LM-LCx)  . Diabetes mellitus without complication ([place])   . Family history of adverse reaction to anesthesia    sister also has atypical pseudo-cholinesterase deficiency  . GERD (gastroesophageal reflux disease)   . History of heart bypass surgery   . Hypertension       Past Surgical History:  Procedure Laterality Date  . COLONOSCOPY N/A 08/29/2018   Procedure: COLONOSCOPY;  Surgeon: Daneil Dolin, MD;  [facility];  Service: Endoscopy;  Laterality: N/A;  9:30  . CORONARY ARTERY BYPASS GRAFT     4 vessels  . CORONARY ATHERECTOMY N/A 09/15/2019   Procedure: CORONARY ATHERECTOMY;  Surgeon: Sherren Mocha, MD;  Location: [facility];  Service: Cardiovascular;  LM-LCx Atherectomy - DES PCI (*Protected LM)   . CORONARY STENT INTERVENTION N/A 09/15/2019   Procedure: CORONARY STENT INTERVENTION;  Surgeon: Sherren Mocha, MD;  Location: [facility];; Successful orbital atherectomy, PTCA, and overlapping DES treatment of severe calcific stenosis in the left main and left circumflex using a 2.5x34 mm Resolute Onyx DES (left circumflex) and 3.0x30 mm Resolute Onyx DES (overlapped proximally in the LCx extending into left main)  . CORONARY/GRAFT ACUTE MI REVASCULARIZATION N/A 09/14/2019   Procedure: Coronary/Graft Acute MI Revascularization;  Surgeon: Sherren Mocha, MD;  Location: [facility];  Service: Cardiovascular;   Laterality: N/A;  . heart bypass    . INSERTION OF MESH  02/07/2015   Procedure: INSERTION OF MESH;  Surgeon: Aviva Signs Md, MD;  Location: [facility];  Service: General;;  . LEFT HEART CATH AND CORONARY ANGIOGRAPHY N/A 09/14/2019   Procedure: LEFT HEART CATH AND CORONARY ANGIOGRAPHY;  Surgeon: Sherren Mocha, MD;  Location: [place] [facility];  Service: Cardiovascular;  Laterality: N/A;  . PARTIAL COLECTOMY N/A 09/17/2018   Procedure: PARTIAL COLECTOMY WITH PARTIAL WEDGE RESECTION LIVER METASTASIS;  Surgeon: Aviva Signs, MD;  Location: [facility];  Service: General;  Laterality: N/A;  . POLYPECTOMY  08/29/2018  Procedure: POLYPECTOMY;  Surgeon: Daneil Dolin, MD;  [facility];  Service: Endoscopy;;  . PORTACATH PLACEMENT Left 10/10/2018   Procedure: INSERTION PORT-A-CATH (catheter in left subclavian);  Surgeon: Aviva Signs, MD;  Location: [facility];  Service: General;  Laterality: Left;  . UMBILICAL HERNIA REPAIR N/A 02/07/2015   Procedure: UMBILICAL HERNIORRHAPHY WITH MESH;  Surgeon: Aviva Signs Md, MD;  Location: [facility];  Service: General;  Laterality: N/A;      Social History:      Social History   Tobacco Use  . Smoking status: Never Smoker  . Smokeless tobacco: Never Used  Substance Use Topics  . Alcohol use: No       Family History :     Family History  Problem Relation Age of Onset  . Hypertension Father   . Diabetes Father   . Heart disease Father   . Diabetes Sister   . Hypertension Sister   . Hypertension Brother   . Diabetes Brother   . Diabetes Sister   . Diabetes Brother   . Heart disease Brother   . Lymphoma Brother   . Hypertension Son       Home Medications:   Prior to Admission medications   Medication Sig Start Date End Date Taking? Authorizing Provider  amLODipine (NORVASC) 10 MG tablet TAKE ONE TABLET BY MOUTH ONCE DAILY. 03/23/19   Lockamy, Randi L, NP-C  aspirin EC 81 MG tablet Take 81 mg by mouth daily.    [provider]  atorvastatin (LIPITOR) 80 MG tablet Take 1 tablet (80 mg total) by mouth daily. 09/17/19   Cheryln Manly, NP  clopidogrel (PLAVIX) 75 MG tablet Take 1 tablet (75 mg total) by mouth daily. 01/01/20   Sherren Mocha, MD  Coenzyme Q10 (COQ10) 100 MG CAPS Take 100 mg by mouth daily.    [provider]  diphenhydramine-acetaminophen (TYLENOL PM) 25-500 MG TABS tablet Take 2 tablets by mouth at bedtime.    [provider]  ezetimibe (ZETIA) 10 MG tablet Take 1 tablet (10 mg total) by mouth daily. 09/17/19   Cheryln Manly, NP  FARXIGA 10 MG TABS tablet Take 10 mg by mouth daily.  12/19/18   [provider]  gabapentin (NEURONTIN) 300 MG capsule Take 300 mg by mouth at bedtime.    [provider]  hydrochlorothiazide (HYDRODIURIL) 25 MG tablet Take 1 tablet (25 mg total) by mouth daily. 09/24/19 12/23/19  Bhagat, Crista Luria, PA  insulin aspart protamine- aspart (NOVOLOG MIX 70/30) (70-30) 100 UNIT/ML injection Inject 20 Units into the skin 3 (three) times daily with meals.    [provider]  Insulin Glargine (BASAGLAR KWIKPEN) 100 UNIT/ML SOPN Inject 40 Units into the skin at bedtime.  12/10/18   [provider]  isosorbide mononitrate (IMDUR) 30 MG 24 hr tablet Take 30 mg by mouth daily. 09/12/19   [provider]  JANUVIA 100 MG tablet Take 100 mg by mouth daily.  12/19/18   [provider]  loperamide (IMODIUM A-D) 2 MG tablet Take 2 capsules after first loose stool and 1 capsule after each loose stool thereafter. 10/27/18   Derek Jack, MD  metFORMIN (GLUCOPHAGE) 500 MG tablet Take 500 mg by mouth 2 (two) times daily. 08/27/19   [provider]  metoprolol succinate (TOPROL-XL) 100 MG 24 hr tablet Take 1 tablet (100 mg total) by mouth 2 (two) times daily. Take with or immediately following a meal. 09/16/19   Cheryln Manly,  NP  nitroGLYCERIN (NITROSTAT) 0.4 MG SL tablet Place 1 tablet (0.4 mg total)  under the tongue every 5 (five) minutes x 3 doses as needed for chest pain. 01/01/20   Sherren Mocha, MD  Omega-3 Fatty Acids (FISH OIL PO) Take 1 capsule by mouth daily.    [provider]  pantoprazole (PROTONIX) 40 MG tablet Take 40 mg by mouth daily.    [provider]  ramipril (ALTACE) 10 MG capsule Take 10 mg by mouth daily.    [provider]     Allergies:     Allergies  Allergen Reactions  . Anectine [Succinylcholine] Other (See Comments)    Atypical pseudocholinesterase deficiency.   . Nsaids Other (See Comments)    Contraindicated (can only have asa 81mg )     Physical Exam:   Vitals  Blood pressure 131/78, pulse (!) 105, temperature 98.3 F (36.8 C), temperature source Oral, resp. rate 20, height 5\' 4"  (1.626 m), weight 92.4 kg, SpO2 95 %.  1.  General: Laying supine in bed, resting comfortably  2. Psychiatric: Mood and behavior appropriate for situation  3. Neurologic: CN II-XII grossly intact No focal deficit on limited exam A&O x4  4. HEENMT:  AT, [place] Neck is supple, trachea midline Mucous membranes moist Sclera anicteric Pupils reactive  5. Respiratory : LCTABL  6. Cardiovascular : Rhythm regular Rate normal No murmurs  7. Gastrointestinal:  Abdomen is soft, non distended and non tender to palpation  8. Skin:  No acute lesions on limited skin exam  9.Musculoskeletal:  No peripheral edema    Data Review:    CBC Recent Labs  Lab 01/04/20 0120  WBC 11.7*  HGB 13.4  HCT 41.6  PLT 314  MCV 89.7  MCH 28.9  MCHC 32.2  RDW 14.6  LYMPHSABS 4.6*  MONOABS 1.2*  EOSABS 0.1  BASOSABS 0.1   ------------------------------------------------------------------------------------------------------------------  Results for orders placed or performed during the hospital encounter of 01/04/20 (from the past 48 hour(s))  Basic metabolic panel     Status: Abnormal   Collection Time: 01/04/20  1:20 AM  Result Value  Ref Range   Sodium 138 135 - 145 mmol/L   Potassium 3.1 (L) 3.5 - 5.1 mmol/L   Chloride 100 98 - 111 mmol/L   CO2 24 22 - 32 mmol/L   Glucose, Bld 235 (H) 70 - 99 mg/dL   BUN 35 (H) 8 - 23 mg/dL   Creatinine, Ser 1.24 0.61 - 1.24 mg/dL   Calcium 9.1 8.9 - 10.3 mg/dL   GFR calc non Af Amer >60 >60 mL/min   GFR calc Af Amer >60 >60 mL/min   Anion gap 14 5 - 15    Comment: Performed at [facility], [address] [place], [place] [address]  CBC with Differential/Platelet     Status: Abnormal   Collection Time: 01/04/20  1:20 AM  Result Value Ref Range   WBC 11.7 (H) 4.0 - 10.5 K/uL   RBC 4.64 4.22 - 5.81 MIL/uL   Hemoglobin 13.4 13.0 - 17.0 g/dL   HCT 41.6 39.0 - 52.0 %   MCV 89.7 80.0 - 100.0 fL   MCH 28.9 26.0 - 34.0 pg   MCHC 32.2 30.0 - 36.0 g/dL   RDW 14.6 11.5 - 15.5 %   Platelets 314 150 - 400 K/uL   nRBC 0.0 0.0 - 0.2 %   Neutrophils Relative % 48 %   Neutro Abs 5.6 1.7 - 7.7 K/uL   Lymphocytes Relative  40 %   Lymphs Abs 4.6 (H) 0.7 - 4.0 K/uL   Monocytes Relative 10 %   Monocytes Absolute 1.2 (H) 0.1 - 1.0 K/uL   Eosinophils Relative 1 %   Eosinophils Absolute 0.1 0.0 - 0.5 K/uL   Basophils Relative 1 %   Basophils Absolute 0.1 0.0 - 0.1 K/uL   Immature Granulocytes 0 %   Abs Immature Granulocytes 0.05 0.00 - 0.07 K/uL    Comment: Performed at [facility], [address], [place], [place] [address]  Troponin I (High Sensitivity)     Status: Abnormal   Collection Time: 01/04/20  1:20 AM  Result Value Ref Range   Troponin I (High Sensitivity) 38 (H) <18 ng/L    Comment: (NOTE) Elevated high sensitivity troponin I (hsTnI) values and significant  changes across serial measurements may suggest ACS but many other  chronic and acute conditions are known to elevate hsTnI results.  Refer to the "Links" section for chest pain algorithms and additional  guidance. Performed at [facility], [address], [place], [place] [address]   APTT     Status: None   Collection  Time: 01/04/20  1:20 AM  Result Value Ref Range   aPTT 31 24 - 36 seconds    Comment: Performed at [facility], [address], [place], [place] [address]  Protime-INR     Status: None   Collection Time: 01/04/20  1:20 AM  Result Value Ref Range   Prothrombin Time 12.4 11.4 - 15.2 seconds   INR 0.9 0.8 - 1.2    Comment: (NOTE) INR goal varies based on device and disease states. Performed at [facility], [address] [place], [place] [address]   Respiratory Panel by RT PCR (Flu A&B, Covid) - Nasopharyngeal Swab     Status: None   Collection Time: 01/04/20  1:24 AM   Specimen: Nasopharyngeal Swab  Result Value Ref Range   SARS Coronavirus 2 by RT PCR NEGATIVE NEGATIVE    Comment: (NOTE) SARS-CoV-2 target nucleic acids are NOT DETECTED. The SARS-CoV-2 RNA is generally detectable in upper respiratoy specimens during the acute phase of infection. The lowest concentration of SARS-CoV-2 viral copies this assay can detect is 131 copies/mL. A negative result does not preclude SARS-Cov-2 infection and should not be used as the sole basis for treatment or other patient management decisions. A negative result may occur with  improper specimen collection/handling, submission of specimen other than nasopharyngeal swab, presence of viral mutation(s) within the areas targeted by this assay, and inadequate number of viral copies (<131 copies/mL). A negative result must be combined with clinical observations, patient history, and epidemiological information. The expected result is Negative. Fact Sheet for Patients:  PinkCheek.be Fact Sheet for Healthcare Providers:  GravelBags.it This test is not yet ap proved or cleared by the [place] FDA and  has been authorized for detection and/or diagnosis of SARS-CoV-2 by FDA under an Emergency Use Authorization (EUA). This EUA will remain  in effect (meaning this test can be used) for the  duration of the COVID-19 declaration under Section 564(b)(1) of the Act, 21 U.S.C. section 360bbb-3(b)(1), unless the authorization is terminated or revoked sooner.    Influenza A by PCR NEGATIVE NEGATIVE   Influenza B by PCR NEGATIVE NEGATIVE    Comment: (NOTE) The Xpert Xpress SARS-CoV-2/FLU/RSV assay is intended as an aid in  the diagnosis of influenza from Nasopharyngeal swab specimens and  should not be used as a sole basis for treatment. Nasal  washings and  aspirates are unacceptable for Xpert Xpress SARS-CoV-2/FLU/RSV  testing. Fact Sheet for Patients: PinkCheek.be Fact Sheet for Healthcare Providers: GravelBags.it This test is not yet approved or cleared by the [place] FDA and  has been authorized for detection and/or diagnosis of SARS-CoV-2 by  FDA under an Emergency Use Authorization (EUA). This EUA will remain  in effect (meaning this test can be used) for the duration of the  Covid-19 declaration under Section 564(b)(1) of the Act, 21  U.S.C. section 360bbb-3(b)(1), unless the authorization is  terminated or revoked. Performed at [facility], [address] [place], [place] [address]   Troponin I (High Sensitivity)     Status: Abnormal   Collection Time: 01/04/20  3:29 AM  Result Value Ref Range   Troponin I (High Sensitivity) 556 (HH) <18 ng/L    Comment: CRITICAL RESULT CALLED TO, READ BACK BY AND VERIFIED WITH: DOSS,M AT 4:50AM ON 01/04/20 BY FESTERMAN,C (NOTE) Elevated high sensitivity troponin I (hsTnI) values and significant  changes across serial measurements may suggest ACS but many other  chronic and acute conditions are known to elevate hsTnI results.  Refer to the Links section for chest pain algorithms and additional  guidance. Performed at [facility], [address], [place], [place] [address]     Chemistries  Recent Labs  Lab 01/04/20 0120  NA 138  K 3.1*  CL 100  CO2 24    GLUCOSE 235*  BUN 35*  CREATININE 1.24  CALCIUM 9.1   ------------------------------------------------------------------------------------------------------------------  ------------------------------------------------------------------------------------------------------------------ GFR: Estimated Creatinine Clearance: 63.3 mL/min (by C-G formula based on SCr of 1.24 mg/dL). Liver Function Tests: No results for input(s): AST, ALT, ALKPHOS, BILITOT, PROT, ALBUMIN in the last 168 hours. No results for input(s): LIPASE, AMYLASE in the last 168 hours. No results for input(s): AMMONIA in the last 168 hours. Coagulation Profile: Recent Labs  Lab 01/04/20 0120  INR 0.9   Cardiac Enzymes: No results for input(s): CKTOTAL, CKMB, CKMBINDEX, TROPONINI in the last 168 hours. BNP (last 3 results) No results for input(s): PROBNP in the last 8760 hours. HbA1C: No results for input(s): HGBA1C in the last 72 hours. CBG: No results for input(s): GLUCAP in the last 168 hours. Lipid Profile: No results for input(s): CHOL, HDL, LDLCALC, TRIG, CHOLHDL, LDLDIRECT in the last 72 hours. Thyroid Function Tests: No results for input(s): TSH, T4TOTAL, FREET4, T3FREE, THYROIDAB in the last 72 hours. Anemia Panel: No results for input(s): VITAMINB12, FOLATE, FERRITIN, TIBC, IRON, RETICCTPCT in the last 72 hours.  --------------------------------------------------------------------------------------------------------------- Urine analysis:    Component Value Date/Time   COLORURINE STRAW (A) 03/10/2019 0800   APPEARANCEUR CLEAR 03/10/2019 0800   LABSPEC 1.025 03/10/2019 0800   PHURINE 5.0 03/10/2019 0800   GLUCOSEU >=500 (A) 03/10/2019 0800   HGBUR NEGATIVE 03/10/2019 0800   BILIRUBINUR NEGATIVE 03/10/2019 0800   KETONESUR NEGATIVE 03/10/2019 0800   PROTEINUR 30 (A) 03/10/2019 0800   NITRITE NEGATIVE 03/10/2019 0800   LEUKOCYTESUR NEGATIVE 03/10/2019 0800      Imaging Results:    DG Chest  Port 1 View  Result Date: 01/04/2020 CLINICAL DATA:  Left-sided chest pain radiating to the left arm. Shortness of breath. EXAM: PORTABLE CHEST 1 VIEW COMPARISON:  09/13/2019 FINDINGS: Left subclavian power port with its tip in the SVC at the azygos level. Previous median sternotomy and CABG. Chronic aortic atherosclerosis. Probable pulmonary venous hypertension, possibly with early interstitial edema. No alveolar edema. No consolidation, collapse or effusion. IMPRESSION: Previous CABG. Pulmonary venous hypertension with  early interstitial edema. Electronically Signed   By: Nelson Chimes M.D.   On: 01/04/2020 01:44    My personal review of EKG: Rhythm NSR, Rate 116/min, QTc 441 , ST depression in inferior and lateral leads   Assessment & Plan:    Active Problems:   Unstable angina (HCC)   1. Chest pain 1. Unstable angina 2. Increasing troponin  3. EKG with ST depression in inferior and lateral leads 4. Improved with nitro - on Nitro drip 5. Hx of CAD s/p cath 08/2019 with 2 x DES placed 6. Continue dual antiplatelet therapy for 1 year 7. Continue beta blocker, asa, statin, imdur 8. Monitor tele at bedside  9. Consult cardiology from ED - no emergent cath - aggressive medical management 10. Continue Heparin drip 2. Hypokalemia 1. Replace and recheck 2. Magnesium pending 3. NSTEMI 1. Trop 38, 556 2. Continue to trend troponin 3. Continue heparin drip 4. Cardiology consult as mentioned above 5. Monitor tele at bedside 6. EKG with ST depression as mentioned above 7. Continue Nitro drip 8. Continue CAD medications as mentioned above 4. Leukocytosis 1. Likely reactive 2. Trend in the AM 5. Elevated Cr w/o AKI 1. Baseline 1.02 2. Cr today 1.24 3. Continue to monitor 4. Avoid nephrotoxic agents when possible 6. HTN 1. Continue norvasc, HCTZ, metoprolol 7. HLD 1. Continue Zetia and statin 8. DMII 1. 40 units long acting insulin at home, and 20 units short acting with  meals 2. 30 units long acting insulin here 3. Patient currently NPO until cardiology sees him 4. When meals resume - 6 units short acting with meals 5. Sliding scale post prandial coverage 9.    DVT Prophylaxis-   Heparin drip - SCDs   AM Labs Ordered, also please review Full Orders  Family Communication: No family at bedside Code Status:  Full  Admission status: Observation: Based on patients clinical presentation and evaluation of above clinical data, I have made determination that patient meetsobservation criteria at this time.  Time spent in minutes : [place]

## 2020-01-04 NOTE — Progress Notes (Signed)
Arrived to eval pt...  In ED I was advised that pt was already picked up by [facility] and is on his way to [facility] for STEMI -Pt was Not seen by me  Please see full H&P dictated by admitting physician Dr. Clearence Ped for same date of service.   -  Per Cardiology service pt will stay on Cardiology Service  Pt was seen by cardiology service   -Hospitalist service will sign off at this time  Roxan Hockey, MD

## 2020-01-04 NOTE — Progress Notes (Signed)
Patient seen and examined. I have personally reviewed all documentation, labs, radiographic and cardiovascular studies, and independently interpreted all ECG's. Patient is being transferred to [facility] for urgent cardiac catheterization. Full note to follow.

## 2020-01-04 NOTE — H&P (View-Only) (Signed)
Patient seen and examined. I have personally reviewed all documentation, labs, radiographic and cardiovascular studies, and independently interpreted all ECG's. Patient is being transferred to [facility] for urgent cardiac catheterization. Full note to follow.

## 2020-01-04 NOTE — ED Notes (Signed)
ED Provider at bedside. 

## 2020-01-04 NOTE — Progress Notes (Signed)
  Echocardiogram 2D Echocardiogram has been performed.  Ian [name] Ian [name] 01/04/2020, 5:08 PM

## 2020-01-04 NOTE — Interval H&P Note (Signed)
Cath Lab Visit (complete for each Cath Lab visit)  Clinical Evaluation Leading to the Procedure:   ACS: Yes.    Non-ACS:    Anginal Classification: CCS IV  Anti-ischemic medical therapy: Maximal Therapy (2 or more classes of medications)  Non-Invasive Test Results: No non-invasive testing performed  Prior CABG: Previous CABG      History and Physical Interval Note:  01/04/2020 10:49 AM  Ian [name]  has presented today for surgery, with the diagnosis of stemi.  The various methods of treatment have been discussed with the patient and family. After consideration of risks, benefits and other options for treatment, the patient has consented to  Procedure(s): LEFT HEART CATH AND CORONARY ANGIOGRAPHY (N/A) CORONARY STENT INTERVENTION (N/A) Intravascular Ultrasound/IVUS (N/A) as a surgical intervention.  The patient's history has been reviewed, patient examined, no change in status, stable for surgery.  I have reviewed the patient's chart and labs.  Questions were answered to the patient's satisfaction.     Sherren Mocha

## 2020-01-05 ENCOUNTER — Ambulatory Visit: Payer: 59 | Admitting: Cardiovascular Disease

## 2020-01-05 DIAGNOSIS — I2119 ST elevation (STEMI) myocardial infarction involving other coronary artery of inferior wall: Secondary | ICD-10-CM

## 2020-01-05 LAB — CBC
HCT: 32.1 % — ABNORMAL LOW (ref 39.0–52.0)
Hemoglobin: 10.4 g/dL — ABNORMAL LOW (ref 13.0–17.0)
MCH: 28.8 pg (ref 26.0–34.0)
MCHC: 32.4 g/dL (ref 30.0–36.0)
MCV: 88.9 fL (ref 80.0–100.0)
Platelets: 224 10*3/uL (ref 150–400)
RBC: 3.61 MIL/uL — ABNORMAL LOW (ref 4.22–5.81)
RDW: 14.6 % (ref 11.5–15.5)
WBC: 9.3 10*3/uL (ref 4.0–10.5)
nRBC: 0 % (ref 0.0–0.2)

## 2020-01-05 LAB — HEMOGLOBIN A1C
Hgb A1c MFr Bld: 9.8 % — ABNORMAL HIGH (ref 4.8–5.6)
Mean Plasma Glucose: 234.56 mg/dL

## 2020-01-05 LAB — BASIC METABOLIC PANEL
Anion gap: 8 (ref 5–15)
BUN: 24 mg/dL — ABNORMAL HIGH (ref 8–23)
CO2: 19 mmol/L — ABNORMAL LOW (ref 22–32)
Calcium: 7.8 mg/dL — ABNORMAL LOW (ref 8.9–10.3)
Chloride: 114 mmol/L — ABNORMAL HIGH (ref 98–111)
Creatinine, Ser: 0.84 mg/dL (ref 0.61–1.24)
GFR calc Af Amer: >60 mL/min (ref >60)
GFR calc non Af Amer: >60 mL/min (ref >60)
Glucose, Bld: 176 mg/dL — ABNORMAL HIGH (ref 70–99)
Potassium: 3.1 mmol/L — ABNORMAL LOW (ref 3.5–5.1)
Sodium: 141 mmol/L (ref 135–145)

## 2020-01-05 LAB — MAGNESIUM: Magnesium: 1.9 mg/dL (ref 1.7–2.4)

## 2020-01-05 LAB — GLUCOSE, CAPILLARY
Glucose-Capillary: 170 mg/dL — ABNORMAL HIGH (ref 70–99)
Glucose-Capillary: 219 mg/dL — ABNORMAL HIGH (ref 70–99)
Glucose-Capillary: 253 mg/dL — ABNORMAL HIGH (ref 70–99)
Glucose-Capillary: 275 mg/dL — ABNORMAL HIGH (ref 70–99)

## 2020-01-05 MED ORDER — POTASSIUM CHLORIDE CRYS ER 20 MEQ PO TBCR
40.0000 meq | EXTENDED_RELEASE_TABLET | Freq: Two times a day (BID) | ORAL | Status: AC
  Administered 2020-01-05 (×2): 40 meq via ORAL
  Filled 2020-01-05 (×2): qty 2

## 2020-01-05 NOTE — Progress Notes (Signed)
Inpatient Diabetes Program Recommendations  AACE/ADA: New Consensus Statement on Inpatient Glycemic Control (2015)  Target Ranges:  Prepandial:   less than 140 mg/dL      Peak postprandial:   less than 180 mg/dL (1-2 hours)      Critically ill patients:  140 - 180 mg/dL   Lab Results  Component Value Date   GLUCAP 219 (H) 01/05/2020   HGBA1C 9.8 (H) 01/04/2020    Review of Glycemic Control Results for Ian [name], Ian [name] (MRN [id number]) as of 01/05/2020 14:11  Ref. Range 01/04/2020 16:16 01/04/2020 22:27 01/05/2020 06:29 01/05/2020 11:15  Glucose-Capillary Latest Ref Range: 70 - 99 mg/dL 320 (H) 174 (H) 170 (H) 219 (H)   Diabetes history: Type 2 DM Outpatient Diabetes medications: Farxiga 10 mg QD, Basaglar 40 units QHS, Metformin 500 mg BID Current orders for Inpatient glycemic control: Levemir 30 units QHS, Novolog 6 units TID, Novolog 0-20 units TID  Inpatient Diabetes Program Recommendations:    Consider increasing Levemir to 35 units QHS.   Thanks, Bronson Curb, MSN, RNC-OB Diabetes Coordinator [phone] (8a-5p)

## 2020-01-05 NOTE — Progress Notes (Addendum)
Progress Note  Patient Name: Ian [name] Date of Encounter: 01/05/2020  Primary Cardiologist: Sherren Mocha, MD   Patient profile   Ian [name]  is a 64 y.o. male, with history of extensive CAD, s/p CABG 2008, MI and 2 stents placement October 2020, severe right lower extremity arterial disease, HTN, GERD, diabetes mellitus, and cancer presents to the ED with lateral STEMI.  He underwent cardiac cath 2/15 and had a successful PCI and stent to the radial graft to PDA.  Subjective   No chest pain or shortness of breath. He has been able to walk to rest room in his room without any issue. He has no complaint. We talk about his Cath results and plan after discharge. We talked about keeping him in hospital for another day to monitor his Hb as he had some Hb drop. He agrees with the plan.  Inpatient Medications    Scheduled Meds:  alum & mag hydroxide-simeth  30 mL Oral Once   And   lidocaine  15 mL Oral Once   amLODipine  10 mg Oral Daily   aspirin  324 mg Oral Once   aspirin EC  81 mg Oral Daily   atorvastatin  80 mg Oral Daily   Chlorhexidine Gluconate Cloth  6 each Topical Daily   ezetimibe  10 mg Oral Daily   hydrochlorothiazide  25 mg Oral Daily   insulin aspart  0-20 Units Subcutaneous TID WC   insulin aspart  6 Units Subcutaneous TID WC   insulin detemir  30 Units Subcutaneous QHS   isosorbide mononitrate  30 mg Oral Daily   metoprolol succinate  100 mg Oral BID   omega-3 acid ethyl esters  1 g Oral Daily   pantoprazole  40 mg Oral Daily   prasugrel  10 mg Oral Daily   ramipril  10 mg Oral Daily   sodium chloride flush  3 mL Intravenous Q12H   sodium chloride flush  3 mL Intravenous Q12H   Continuous Infusions:  sodium chloride     sodium chloride     sodium chloride     nitroGLYCERIN     PRN Meds: sodium chloride, sodium chloride, acetaminophen, acetaminophen, ondansetron (ZOFRAN) IV, ondansetron (ZOFRAN) IV, sodium chloride flush,  sodium chloride flush   Vital Signs    Vitals:   01/05/20 0400 01/05/20 0500 01/05/20 0600 01/05/20 0700  BP: (!) 148/72 129/60 (!) 149/70 (!) 150/85  Pulse: 87 94 94 95  Resp: 15 20 (!) 22 20  Temp:      TempSrc:      SpO2: 96% 94% 96% 96%  Weight: 92.8 kg     Height:        Intake/Output Summary (Last 24 hours) at 01/05/2020 0728 Last data filed at 01/05/2020 0500 Gross per 24 hour  Intake 1090.32 ml  Output 700 ml  Net 390.32 ml   Last 3 Weights 01/05/2020 01/04/2020 01/01/2020  Weight (lbs) 204 lb 9.4 oz 203 lb 11.3 oz 203 lb 12.8 oz  Weight (kg) 92.8 kg 92.4 kg 92.443 kg      Telemetry    NSR with PVC - Personally Reviewed  ECG    ST elevation in V1 and AVL (improved). ST depression in inferior leads improved - Personally Reviewed  Physical Exam   GEN: No acute distress.   Neck: No JVD Cardiac: RRR, soft II/VI systolic murmur at P area, no rubs, or gallops.  Respiratory: Clear to auscultation bilaterally. GI: Soft, nontender, non-distended  MS: No edema; No deformity. Neuro:  Nonfocal  Psych: Normal affect   Labs    High Sensitivity Troponin:   Recent Labs  Lab 01/04/20 0120 01/04/20 0329 01/04/20 1702 01/04/20 1832  TROPONINIHS 38* 556* 8,610* 8,219*      Chemistry Recent Labs  Lab 01/04/20 0120 01/05/20 0347  NA 138 141  K 3.1* 3.1*  CL 100 114*  CO2 24 19*  GLUCOSE 235* 176*  BUN 35* 24*  CREATININE 1.24 0.84  CALCIUM 9.1 7.8*  GFRNONAA >60 >60  GFRAA >60 >60  ANIONGAP 14 8     Hematology Recent Labs  Lab 01/04/20 0120 01/05/20 0347  WBC 11.7* 9.3  RBC 4.64 3.61*  HGB 13.4 10.4*  HCT 41.6 32.1*  MCV 89.7 88.9  MCH 28.9 28.8  MCHC 32.2 32.4  RDW 14.6 14.6  PLT 314 224    BNPNo results for input(s): BNP, PROBNP in the last 168 hours.   DDimer No results for input(s): DDIMER in the last 168 hours.   Radiology    CARDIAC CATHETERIZATION  Result Date: 01/04/2020 1.  Severe multivessel coronary artery disease with  chronic total occlusion of the LAD and RCA, continued patency of the left mainstem and left circumflex stents with focal mild to moderate in-stent restenosis at the transition of the left main into the circumflex. 2.  Status post aortocoronary bypass surgery with continued patency of the LIMA to LAD graft and severe aorto ostial stenosis of the radial graft to PDA treated successfully with PCI using a 3.0 x 30 mm resolute Onyx DES with intravascular ultrasound guidance 3.  Nonobstructive ostial left circumflex restenosis as demonstrated by intravascular ultrasound with a minimal lumen area of 5.96 mm Recommendations: Long-term dual antiplatelet therapy with aspirin and Effient, continued aggressive medical therapy, 2D echocardiogram for assessment of LV systolic function.  DG Chest Port 1 View  Result Date: 01/04/2020 CLINICAL DATA:  Left-sided chest pain radiating to the left arm. Shortness of breath. EXAM: PORTABLE CHEST 1 VIEW COMPARISON:  09/13/2019 FINDINGS: Left subclavian power port with its tip in the SVC at the azygos level. Previous median sternotomy and CABG. Chronic aortic atherosclerosis. Probable pulmonary venous hypertension, possibly with early interstitial edema. No alveolar edema. No consolidation, collapse or effusion. IMPRESSION: Previous CABG. Pulmonary venous hypertension with early interstitial edema. Electronically Signed   By: Nelson Chimes M.D.   On: 01/04/2020 01:44   ECHOCARDIOGRAM COMPLETE  Result Date: 01/04/2020    ECHOCARDIOGRAM REPORT   Patient Name:   Ian [name] Date of Exam: 01/04/2020 Medical Rec #:  [id number]        Height:       64.0 in Accession #:    [id number]       Weight:       203.7 lb Date of Birth:  1956-04-06        BSA:          1.97 m Patient Age:    40 years         BP:           106/63 mmHg Patient Gender: M                HR:           86 bpm. Exam Location:  Inpatient Procedure: 2D Echo Indications:    Chest Pain 786.50 / R07.9  History:         Patient has prior history of Echocardiogram examinations, most  recent 09/14/2019. NSTEMI and CAD, Prior CABG,                 Arrythmias:Palpitations, Signs/Symptoms:Unstable angina; Risk                 Factors:Dyslipidemia and Hypertension.  Sonographer:    Vikki Ports Turrentine Referring Phys: Champ  1. Left ventricular ejection fraction, by estimation, is 45 to 50%. The left ventricle has mildly decreased function. The left ventricle demonstrates global hypokinesis. There is mild concentric left ventricular hypertrophy. Left ventricular diastolic parameters are consistent with Grade II diastolic dysfunction (pseudonormalization). Elevated left atrial pressure.  2. Right ventricular systolic function is normal. The right ventricular size is normal. Tricuspid regurgitation signal is inadequate for assessing PA pressure.  3. Left atrial size was mild to moderately dilated.  4. The mitral valve is degenerative. Mild mitral valve regurgitation.  5. The aortic valve is tricuspid. Aortic valve regurgitation is not visualized. Mild aortic valve stenosis.  6. The inferior vena cava is dilated in size with >50% respiratory variability, suggesting right atrial pressure of 8 mmHg. Comparison(s): A prior study was performed on 09/14/2019. Prior images reviewed side by side. Changes from prior study are noted. The left ventricular function is worsened. FINDINGS  Left Ventricle: Left ventricular ejection fraction, by estimation, is 45 to 50%. The left ventricle has mildly decreased function. The left ventricle demonstrates global hypokinesis. There is mild concentric left ventricular hypertrophy. Abnormal (paradoxical) septal motion consistent with post-operative status. Left ventricular diastolic parameters are consistent with Grade II diastolic dysfunction (pseudonormalization). Elevated left atrial pressure. Right Ventricle: The right ventricular size is normal. No increase in right  ventricular wall thickness. Right ventricular systolic function is normal. Tricuspid regurgitation signal is inadequate for assessing PA pressure. Left Atrium: Left atrial size was mild to moderately dilated. Right Atrium: Right atrial size was normal in size. Pericardium: Trivial pericardial effusion is present. Presence of pericardial fat pad. Mitral Valve: The mitral valve is degenerative in appearance. There is moderate thickening of the mitral valve leaflet(s). There is moderate calcification of the mitral valve leaflet(s). Mild mitral annular calcification. Mild mitral valve regurgitation. Tricuspid Valve: The tricuspid valve is grossly normal. Tricuspid valve regurgitation is trivial. Aortic Valve: The aortic valve is tricuspid. Aortic valve regurgitation is not visualized. Mild aortic stenosis is present. Aortic valve mean gradient measures 6.0 mmHg. Aortic valve peak gradient measures 9.7 mmHg. Aortic valve area, by VTI measures 1.69 cm. Pulmonic Valve: The pulmonic valve was grossly normal. Pulmonic valve regurgitation is not visualized. Aorta: The aortic root is normal in size and structure. Venous: The inferior vena cava is dilated in size with greater than 50% respiratory variability, suggesting right atrial pressure of 8 mmHg. IAS/Shunts: No atrial level shunt detected by color flow Doppler.  LEFT VENTRICLE PLAX 2D LVIDd:         4.70 cm  Diastology LVIDs:         3.60 cm  LV e' lateral:   9.46 cm/s LV PW:         1.30 cm  LV E/e' lateral: 12.5 LV IVS:        1.30 cm  LV e' medial:    5.55 cm/s LVOT diam:     2.20 cm  LV E/e' medial:  21.3 LV SV:         50.56 ml LV SV Index:   23.01 LVOT Area:     3.80 cm  RIGHT VENTRICLE RV S prime:  11.10 cm/s LEFT ATRIUM            Index       RIGHT ATRIUM           Index LA diam:      3.90 cm  1.98 cm/m  RA Area:     18.40 cm LA Vol (A2C): 61.2 ml  31.04 ml/m RA Volume:   45.50 ml  23.08 ml/m LA Vol (A4C): 101.0 ml 51.23 ml/m  AORTIC VALVE AV Area  (Vmax):    1.79 cm AV Area (Vmean):   1.79 cm AV Area (VTI):     1.69 cm AV Vmax:           156.00 cm/s AV Vmean:          114.000 cm/s AV VTI:            0.300 m AV Peak Grad:      9.7 mmHg AV Mean Grad:      6.0 mmHg LVOT Vmax:         73.30 cm/s LVOT Vmean:        53.600 cm/s LVOT VTI:          0.133 m LVOT/AV VTI ratio: 0.44  AORTA Ao Root diam: 3.30 cm MITRAL VALVE MV Area (PHT): 5.02 cm     SHUNTS MV Decel Time: 151 msec     Systemic VTI:  0.13 m MV E velocity: 118.00 cm/s  Systemic Diam: 2.20 cm MV A velocity: 103.00 cm/s MV E/A ratio:  1.15 Eleonore Chiquito MD Electronically signed by Eleonore Chiquito MD Signature Date/Time: 01/04/2020/6:51:18 PM    Final     Cardiac Studies   Heart Cath 01/04/2020 1.  Severe multivessel coronary artery disease with chronic total occlusion of the LAD and RCA, continued patency of the left mainstem and left circumflex stents with focal mild to moderate in-stent restenosis at the transition of the left main into the circumflex. 2.  Status post aortocoronary bypass surgery with continued patency of the LIMA to LAD graft and severe aorto ostial stenosis of the radial graft to PDA treated successfully with PCI using a 3.0 x 30 mm resolute Onyx DES with intravascular ultrasound guidance 3.  Nonobstructive ostial left circumflex restenosis as demonstrated by intravascular ultrasound with a minimal lumen area of 5.96 mm  Recommendations: Long-term dual antiplatelet therapy with aspirin and Effient, continued aggressive medical therapy, 2D echocardiogram for assessment of LV  Diagnostic Dominance: Right  Intervention    systolic function. Echo 01/04/2020 IMPRESSIONS    1. Left ventricular ejection fraction, by estimation, is 45 to 50%. The  left ventricle has mildly decreased function. The left ventricle  demonstrates global hypokinesis. There is mild concentric left ventricular  hypertrophy. Left ventricular diastolic  parameters are consistent with Grade II  diastolic dysfunction  (pseudonormalization). Elevated left atrial pressure.  2. Right ventricular systolic function is normal. The right ventricular  size is normal. Tricuspid regurgitation signal is inadequate for assessing  PA pressure.  3. Left atrial size was mild to moderately dilated.  4. The mitral valve is degenerative. Mild mitral valve regurgitation.  5. The aortic valve is tricuspid. Aortic valve regurgitation is not  visualized. Mild aortic valve stenosis.  6. The inferior vena cava is dilated in size with >50% respiratory  variability, suggesting right atrial pressure of 8 mmHg.   Comparison(s): A prior study was performed on 09/14/2019. Prior images  reviewed side by side. Changes from prior study are noted. The left  ventricular function is worsened.  Patient Profile     64 y.o. male  with history of extensive CAD, s/p CABG 2008, MI and 2 stents placement October 2020, severe right lower extremity arterial disease, HTN, GERD, diabetes mellitus, and cancer presents to the ED with lateral STEMI.  He underwent cardiac cath 2/15 and had a successful PCI and stent to the radial graft to PDA.  Assessment & Plan    Lateral STEMI: Patient with severe multivessel CAD, history of CABG at 2008 and MI with 2 stent placement on October 2020.  He presented with chest pain and found to have lateral ST elevation MI.  (ST elevation at aVL and V1 and troponin elevated up to ~8000) He underwent emergent heart cath 01/04/2020 that found to have severe multivessel CAD, chronic total occlusion of LAD and RCA, focal mild to moderate in-stent restenosis of radial graft to PDA that was managed with PCI and DES placement. He has been symptoms free since then Vital signs stable in cardiac telemetry without arrhythmia  He is a stable regarding cardiac standpoint, however his hemoglobin today dropped to 10.4 from 12 yesterday.  So we will transfer him to cardiac telemetry today to repeat CBC  tomorrow.  If stable may discharge tomorrow.  -Continue aspirin and Effient -Continue Lipitor and Zetia and omega-3 -Continue ramipril 10 mg daily -Continue metoprolol succinate 100 mg twice daily -Transfer to cardiac telemetry today -His LDL is above the goal and HDL is 29 despite being on high-intensity statin, Zetia, omega-3.  Given extensive CAD, HLD, he may be candidate for starting PSK 9 inhibitor  Hypokalemia: Potassium 3.1 -Replaced with p.o. K. Dur 40 mEq twice daily today -Checking mg -BMP daily.  Keep K>4 and MG> 2  2/6 systolic murmur at upper LSB: Can be secondary to mild aortic stenosis reported on echo. -No further intervention this point but will need monitoring  HTN: Blood pressure at 120s-150s over 80s -Continue home amlodipine, metoprolol 100 mg twice daily, ramipril 10 mg daily and HCTZ 25 mg daily  Insulin-dependent uncontrolled DM: He is on insulin, Metformin, Farxiga, Januvia at home -Hemoglobin A1c -Continue SSI, Levemir 30 units daily, NovoLog 6 units 3 times daily -CBG monitoring  For questions or updates, please contact [facility] Please consult [email or web address] for contact info under        Signed, Dewayne Hatch, MD  01/05/2020, 7:28 AM     I have personally seen and examined this patient. I agree with the assessment and plan as outlined above.  He is doing well this am post PCI. Admitted with inferolateral STEMI. Severe disease in the ostium/proximal body of arterial graft to PDA. One DES in the graft.  Mild reduction in LV systolic function.  Will continue ASA, Effient, statin, beta blocker and Ace-inh.  Transfer to telemetry unit today.  Repeat CBC in am (Hgb drop from 13.4 to 10.4)  Will need referral to lipid clinic post discharge for consideration for PCSK9 inh.  Anticipate discharge home tomorrow   Lauree Chandler 01/05/2020 11:59 AM

## 2020-01-05 NOTE — Plan of Care (Signed)

## 2020-01-05 NOTE — Progress Notes (Signed)
Report called to [facility] RN.  Denies questions will pack patient belongings and transfer.  Patiens wife at bedside

## 2020-01-05 NOTE — Progress Notes (Signed)
CARDIAC REHAB PHASE I   PRE:  Rate/Rhythm: 87 SR  BP:  Supine:   Sitting: 155/71  Standing:    SaO2: 97%RA  MODE:  Ambulation: 190 ft   POST:  Rate/Rhythm: 98 SR  BP:  Supine:   Sitting: 145/62  Standing:    SaO2: 98%RA 1015-1120 Pt walked 190 ft on RA with steady gait and tolerated without CP. Only walked short distance due to PVD and legs starting to hurt. Pt stated he has MI booklet and diets from our sessions in October. Discussed importance of effient with stent. Reviewed NTG use (needs new bottle), MI restrictions and CRP 2. Pt referred to [facility] program to meet requirement but pt will not be able to attend until PVD addressed. Discussed heart healthy food choices. A1C has dropped to 9.8 from 12. Encouraged pt to continue to work on getting down and congratulated on lowering it.  Wife and pt voiced understanding of ed.  Graylon Good, RN BSN  01/05/2020 11:13 AM

## 2020-01-06 LAB — BASIC METABOLIC PANEL
Anion gap: 14 (ref 5–15)
BUN: 25 mg/dL — ABNORMAL HIGH (ref 8–23)
CO2: 21 mmol/L — ABNORMAL LOW (ref 22–32)
Calcium: 9.6 mg/dL (ref 8.9–10.3)
Chloride: 107 mmol/L (ref 98–111)
Creatinine, Ser: 1.12 mg/dL (ref 0.61–1.24)
GFR calc Af Amer: >60 mL/min (ref >60)
GFR calc non Af Amer: >60 mL/min (ref >60)
Glucose, Bld: 203 mg/dL — ABNORMAL HIGH (ref 70–99)
Potassium: 4.4 mmol/L (ref 3.5–5.1)
Sodium: 142 mmol/L (ref 135–145)

## 2020-01-06 LAB — CBC
HCT: 38.3 % — ABNORMAL LOW (ref 39.0–52.0)
Hemoglobin: 12.6 g/dL — ABNORMAL LOW (ref 13.0–17.0)
MCH: 28.9 pg (ref 26.0–34.0)
MCHC: 32.9 g/dL (ref 30.0–36.0)
MCV: 87.8 fL (ref 80.0–100.0)
Platelets: 268 10*3/uL (ref 150–400)
RBC: 4.36 MIL/uL (ref 4.22–5.81)
RDW: 14.5 % (ref 11.5–15.5)
WBC: 12.7 10*3/uL — ABNORMAL HIGH (ref 4.0–10.5)
nRBC: 0 % (ref 0.0–0.2)

## 2020-01-06 LAB — MAGNESIUM: Magnesium: 2.1 mg/dL (ref 1.7–2.4)

## 2020-01-06 LAB — GLUCOSE, CAPILLARY: Glucose-Capillary: 283 mg/dL — ABNORMAL HIGH (ref 70–99)

## 2020-01-06 MED ORDER — ROSUVASTATIN CALCIUM 40 MG PO TABS: 40.0000 mg | ORAL_TABLET | Freq: Every day | ORAL | 0 refills | Status: DC

## 2020-01-06 MED ORDER — HEPARIN SOD (PORK) LOCK FLUSH 100 UNIT/ML IV SOLN
500.0000 [IU] | INTRAVENOUS | Status: AC | PRN
  Administered 2020-01-06: 500 [IU]
  Filled 2020-01-06: qty 5

## 2020-01-06 MED ORDER — PRASUGREL HCL 10 MG PO TABS: 10.0000 mg | ORAL_TABLET | Freq: Every day | ORAL | 2 refills | Status: DC

## 2020-01-06 NOTE — Progress Notes (Signed)
CARDIAC REHAB PHASE I   PRE:  Rate/Rhythm: 95 SR  BP:  Supine:   Sitting: 151/70  Standing:    SaO2: 96%RA  MODE:  Ambulation: 300 ft   POST:  Rate/Rhythm: 114 ST  BP:  Supine:   Sitting: 185/889, 163/83  Standing:    SaO2: 95%RA 0830-0850 Pt walked 300 ft on RA with steady gait and no CP. Ball of left foot hurting with walk. To recliner after walk.   Graylon Good, RN BSN  01/06/2020 8:45 AM

## 2020-01-06 NOTE — Plan of Care (Signed)
  Problem: Activity: Goal: Ability to tolerate increased activity will improve Outcome: Progressing   Problem: Education: Goal: Knowledge of General Education information will improve Description: Including pain rating scale, medication(s)/side effects and non-pharmacologic comfort measures Outcome: Progressing   Problem: Health Behavior/Discharge Planning: Goal: Ability to manage health-related needs will improve Outcome: Progressing   Problem: Clinical Measurements: Goal: Ability to maintain clinical measurements within normal limits will improve Outcome: Progressing   Problem: Pain Managment: Goal: General experience of comfort will improve Outcome: Progressing   Problem: Safety: Goal: Ability to remain free from injury will improve Outcome: Progressing

## 2020-01-06 NOTE — Discharge Summary (Signed)
Discharge Summary    Patient ID: [name],  MRN: [id number], DOB/AGE: [birth year] 64 y.o.  Admit date: 01/04/2020 Discharge date: 01/06/2020  Primary Care Provider: Sharilyn Sites Primary Cardiologist: Sherren Mocha, MD  Discharge Diagnoses    Principal Problem:   ST elevation myocardial infarction (STEMI) of inferolateral wall [facility]) Active Problems:   Essential hypertension   Hyperlipidemia associated with type 2 diabetes mellitus ([place])   Diabetes mellitus type II, uncontrolled ([place])   Non-ST elevation (NSTEMI) myocardial infarction [facility])   NSTEMI (non-ST elevated myocardial infarction) ([place])   Unstable angina ([place])   Allergies Allergies  Allergen Reactions  . Anectine [Succinylcholine] Other (See Comments)    Atypical pseudocholinesterase deficiency.   . Nsaids Other (See Comments)    Contraindicated (can only have asa 81mg )    Diagnostic Studies/Procedures    Cath: 01/04/20  1.  Severe multivessel coronary artery disease with chronic total occlusion of the LAD and RCA, continued patency of the left mainstem and left circumflex stents with focal mild to moderate in-stent restenosis at the transition of the left main into the circumflex. 2.  Status post aortocoronary bypass surgery with continued patency of the LIMA to LAD graft and severe aorto ostial stenosis of the radial graft to PDA treated successfully with PCI using a 3.0 x 30 mm resolute Onyx DES with intravascular ultrasound guidance 3.  Nonobstructive ostial left circumflex restenosis as demonstrated by intravascular ultrasound with a minimal lumen area of 5.96 mm  Recommendations: Long-term dual antiplatelet therapy with aspirin and Effient, continued aggressive medical therapy, 2D echocardiogram for assessment of LV systolic function.  Diagnostic Dominance: Right  Intervention   TTE: 01/04/20  IMPRESSIONS    1. Left ventricular ejection fraction, by estimation, is 45 to 50%. The  left  ventricle has mildly decreased function. The left ventricle  demonstrates global hypokinesis. There is mild concentric left ventricular  hypertrophy. Left ventricular diastolic  parameters are consistent with Grade II diastolic dysfunction  (pseudonormalization). Elevated left atrial pressure.  2. Right ventricular systolic function is normal. The right ventricular  size is normal. Tricuspid regurgitation signal is inadequate for assessing  PA pressure.  3. Left atrial size was mild to moderately dilated.  4. The mitral valve is degenerative. Mild mitral valve regurgitation.  5. The aortic valve is tricuspid. Aortic valve regurgitation is not  visualized. Mild aortic valve stenosis.  6. The inferior vena cava is dilated in size with >50% respiratory  variability, suggesting right atrial pressure of 8 mmHg.   Comparison(s): A prior study was performed on 09/14/2019. Prior images  reviewed side by side. Changes from prior study are noted. The left  ventricular function is worsened.  _____________   History of Present Illness     [name] a hx of coronary artery disease status post CABG in 2008, metastatic colon cancer to the liver with history of partial colectomy and wedge resection of the liver, type 2 diabetes, hypertension, and peripheral neuropathy. He was hospitalized in October 2020 with ST elevation infarction treated with primary PCI of the radial artery to PDA graft using 2 drug-eluting stents. He underwent staged PCI with orbital atherectomy and stenting of a protected left main stem into the circumflex, also treated with 2 overlapping drug-eluting stents. LV function is preserved by echo assessment. He was seen back in follow-up September 24, 2019 and was doing well at that time.  Patient saw Dr. Burt Knack Friday, 01/01/2020 and was experiencing some pain in his upper  back that bothered him after working hard.  He was also having some chest pain and a difficult time  deciphering whether it was similar to his past cardiac pain. Lexiscan Myoview was ordered.  Brilinta was changed to Plavix. Patient was also having claudication symptoms and and arterial Dopplers show severe  right lower extremity arterial disease.  He has an appointment to see Dr. Gwenlyn Found. Patient developed chest tightness into his back and down his left arm.  He got some relief from sublingual nitroglycerin but pain returned and he came to the emergency room.  He was given IV heparin and finally got some relief after he was given IV nitroglycerin.  EKG shows ST elevation in aVR with ST depression throughout. He was admitted and taken to the cardiac cath emergently.   Hospital Course     Underwent cardiac cath noted above with PCI/DES to the ostium/proximal body of the arterial graft to PDA. He was placed on DAPT with ASA/Effient for at least one year but likely long term. He was continued on BB and ACEi with blood pressures tolerating. hsTn peaked at 8610. Follow up echo showed an EF of 45-50% with global hypokinesis and G2DD. LDL was noted at 102, reported he had myalgias with his home atorvastatin. Switched to Crestor 40mg  in addition to Zetia. Will plan to refer to outpatient lipid clinic at discharge. Worked well with cardiac rehab without recurrent chest pain. Hgb A1c 9.8, but trending down from 12 back (10/20). Will be resumed on his home medication regimen at discharge.   General: Well developed, well nourished, male appearing in no acute distress. Head: Normocephalic, atraumatic.  Neck: Supple without bruits, JVD. Lungs:  Resp regular and unlabored, CTA. Heart: RRR, S1, S2, no S3, S4, or murmur; no rub. Abdomen: Soft, non-tender, non-distended with normoactive bowel sounds. No hepatomegaly. No rebound/guarding. No obvious abdominal masses. Extremities: No clubbing, cyanosis, edema. Distal pedal pulses are 2+ bilaterally. Right groin cath site stable without bruising or hematoma Neuro: Alert and  oriented X 3. Moves all extremities spontaneously. Psych: Normal affect.  Rowe Robert was seen by Dr. Angelena Form and determined stable for discharge home. Follow up in the office has been arranged. Medications are listed below.   _____________  Discharge Vitals Blood pressure (!) 151/70, pulse 100, temperature 98 F (36.7 C), temperature source Oral, resp. rate 20, height 5\' 4"  (1.626 m), weight 89.7 kg, SpO2 97 %.  Filed Weights   01/04/20 0116 01/05/20 0400 01/06/20 0451  Weight: 92.4 kg 92.8 kg 89.7 kg    Labs & Radiologic Studies    CBC Recent Labs    01/04/20 0120 01/04/20 0120 01/05/20 0347 01/06/20 0413  WBC 11.7*   < > 9.3 12.7*  NEUTROABS 5.6  --   --   --   HGB 13.4   < > 10.4* 12.6*  HCT 41.6   < > 32.1* 38.3*  MCV 89.7   < > 88.9 87.8  PLT 314   < > 224 268   < > = values in this interval not displayed.   Basic Metabolic Panel Recent Labs    01/05/20 0347 01/06/20 0413  NA 141 142  K 3.1* 4.4  CL 114* 107  CO2 19* 21*  GLUCOSE 176* 203*  BUN 24* 25*  CREATININE 0.84 1.12  CALCIUM 7.8* 9.6  MG 1.9 2.1   Liver Function Tests No results for input(s): AST, ALT, ALKPHOS, BILITOT, PROT, ALBUMIN in the last 72 hours. No results for input(s):  LIPASE, AMYLASE in the last 72 hours. Cardiac Enzymes No results for input(s): CKTOTAL, CKMB, CKMBINDEX, TROPONINI in the last 72 hours. BNP Invalid input(s): POCBNP D-Dimer No results for input(s): DDIMER in the last 72 hours. Hemoglobin A1C Recent Labs    01/04/20 0120  HGBA1C 9.8*   Fasting Lipid Panel Recent Labs    01/04/20 1702  CHOL 185  HDL 22*  LDLCALC 102*  TRIG 306*  CHOLHDL 8.4   Thyroid Function Tests No results for input(s): TSH, T4TOTAL, T3FREE, THYROIDAB in the last 72 hours.  Invalid input(s): FREET3 _____________  CARDIAC CATHETERIZATION  Result Date: 01/04/2020 1.  Severe multivessel coronary artery disease with chronic total occlusion of the LAD and RCA, continued patency  of the left mainstem and left circumflex stents with focal mild to moderate in-stent restenosis at the transition of the left main into the circumflex. 2.  Status post aortocoronary bypass surgery with continued patency of the LIMA to LAD graft and severe aorto ostial stenosis of the radial graft to PDA treated successfully with PCI using a 3.0 x 30 mm resolute Onyx DES with intravascular ultrasound guidance 3.  Nonobstructive ostial left circumflex restenosis as demonstrated by intravascular ultrasound with a minimal lumen area of 5.96 mm Recommendations: Long-term dual antiplatelet therapy with aspirin and Effient, continued aggressive medical therapy, 2D echocardiogram for assessment of LV systolic function.  DG Chest Port 1 View  Result Date: 01/04/2020 CLINICAL DATA:  Left-sided chest pain radiating to the left arm. Shortness of breath. EXAM: PORTABLE CHEST 1 VIEW COMPARISON:  09/13/2019 FINDINGS: Left subclavian power port with its tip in the SVC at the azygos level. Previous median sternotomy and CABG. Chronic aortic atherosclerosis. Probable pulmonary venous hypertension, possibly with early interstitial edema. No alveolar edema. No consolidation, collapse or effusion. IMPRESSION: Previous CABG. Pulmonary venous hypertension with early interstitial edema. Electronically Signed   By: Nelson Chimes M.D.   On: 01/04/2020 01:44   ECHOCARDIOGRAM COMPLETE  Result Date: 01/04/2020    ECHOCARDIOGRAM REPORT   Patient Name:   Ian [name] Date of Exam: 01/04/2020 Medical Rec #:  [id number]        Height:       64.0 in Accession #:    [id number]       Weight:       203.7 lb Date of Birth:  May 30, 1956        BSA:          1.97 m Patient Age:    86 years         BP:           106/63 mmHg Patient Gender: M                HR:           86 bpm. Exam Location:  Inpatient Procedure: 2D Echo Indications:    Chest Pain 786.50 / R07.9  History:        Patient has prior history of Echocardiogram examinations, most                  recent 09/14/2019. NSTEMI and CAD, Prior CABG,                 Arrythmias:Palpitations, Signs/Symptoms:Unstable angina; Risk                 Factors:Dyslipidemia and Hypertension.  Sonographer:    Vikki Ports Turrentine Referring Phys: [place]  1. Left ventricular ejection fraction, by estimation, is  45 to 50%. The left ventricle has mildly decreased function. The left ventricle demonstrates global hypokinesis. There is mild concentric left ventricular hypertrophy. Left ventricular diastolic parameters are consistent with Grade II diastolic dysfunction (pseudonormalization). Elevated left atrial pressure.  2. Right ventricular systolic function is normal. The right ventricular size is normal. Tricuspid regurgitation signal is inadequate for assessing PA pressure.  3. Left atrial size was mild to moderately dilated.  4. The mitral valve is degenerative. Mild mitral valve regurgitation.  5. The aortic valve is tricuspid. Aortic valve regurgitation is not visualized. Mild aortic valve stenosis.  6. The inferior vena cava is dilated in size with >50% respiratory variability, suggesting right atrial pressure of 8 mmHg. Comparison(s): A prior study was performed on 09/14/2019. Prior images reviewed side by side. Changes from prior study are noted. The left ventricular function is worsened. FINDINGS  Left Ventricle: Left ventricular ejection fraction, by estimation, is 45 to 50%. The left ventricle has mildly decreased function. The left ventricle demonstrates global hypokinesis. There is mild concentric left ventricular hypertrophy. Abnormal (paradoxical) septal motion consistent with post-operative status. Left ventricular diastolic parameters are consistent with Grade II diastolic dysfunction (pseudonormalization). Elevated left atrial pressure. Right Ventricle: The right ventricular size is normal. No increase in right ventricular wall thickness. Right ventricular systolic function is  normal. Tricuspid regurgitation signal is inadequate for assessing PA pressure. Left Atrium: Left atrial size was mild to moderately dilated. Right Atrium: Right atrial size was normal in size. Pericardium: Trivial pericardial effusion is present. Presence of pericardial fat pad. Mitral Valve: The mitral valve is degenerative in appearance. There is moderate thickening of the mitral valve leaflet(s). There is moderate calcification of the mitral valve leaflet(s). Mild mitral annular calcification. Mild mitral valve regurgitation. Tricuspid Valve: The tricuspid valve is grossly normal. Tricuspid valve regurgitation is trivial. Aortic Valve: The aortic valve is tricuspid. Aortic valve regurgitation is not visualized. Mild aortic stenosis is present. Aortic valve mean gradient measures 6.0 mmHg. Aortic valve peak gradient measures 9.7 mmHg. Aortic valve area, by VTI measures 1.69 cm. Pulmonic Valve: The pulmonic valve was grossly normal. Pulmonic valve regurgitation is not visualized. Aorta: The aortic root is normal in size and structure. Venous: The inferior vena cava is dilated in size with greater than 50% respiratory variability, suggesting right atrial pressure of 8 mmHg. IAS/Shunts: No atrial level shunt detected by color flow Doppler.  LEFT VENTRICLE PLAX 2D LVIDd:         4.70 cm  Diastology LVIDs:         3.60 cm  LV e' lateral:   9.46 cm/s LV PW:         1.30 cm  LV E/e' lateral: 12.5 LV IVS:        1.30 cm  LV e' medial:    5.55 cm/s LVOT diam:     2.20 cm  LV E/e' medial:  21.3 LV SV:         50.56 ml LV SV Index:   23.01 LVOT Area:     3.80 cm  RIGHT VENTRICLE RV S prime:     11.10 cm/s LEFT ATRIUM            Index       RIGHT ATRIUM           Index LA diam:      3.90 cm  1.98 cm/m  RA Area:     18.40 cm LA Vol (A2C): 61.2 ml  31.04 ml/m RA Volume:  45.50 ml  23.08 ml/m LA Vol (A4C): 101.0 ml 51.23 ml/m  AORTIC VALVE AV Area (Vmax):    1.79 cm AV Area (Vmean):   1.79 cm AV Area (VTI):     1.69  cm AV Vmax:           156.00 cm/s AV Vmean:          114.000 cm/s AV VTI:            0.300 m AV Peak Grad:      9.7 mmHg AV Mean Grad:      6.0 mmHg LVOT Vmax:         73.30 cm/s LVOT Vmean:        53.600 cm/s LVOT VTI:          0.133 m LVOT/AV VTI ratio: 0.44  AORTA Ao Root diam: 3.30 cm MITRAL VALVE MV Area (PHT): 5.02 cm     SHUNTS MV Decel Time: 151 msec     Systemic VTI:  0.13 m MV E velocity: 118.00 cm/s  Systemic Diam: 2.20 cm MV A velocity: 103.00 cm/s MV E/A ratio:  1.15 Eleonore Chiquito MD Electronically signed by Eleonore Chiquito MD Signature Date/Time: 01/04/2020/6:51:18 PM    Final    LE ART SEG MULTI (Segm & LE [place])  Result Date: 01/01/2020 LOWER EXTREMITY DOPPLER STUDY Indications: Peripheral artery disease, and c/o sore on his right big toe and              pain in the right foot x 6 months. He does not have typical              symptoms of calf claudication. High Risk Factors: Hypertension, hyperlipidemia, Diabetes, no history of                    smoking, prior MI, coronary artery disease. Other Factors: Dark discoloration of the tip of the right great toe, and                erythema of the entire forefoot.                 SEE LEA DUPLEX REPORT.  Vascular Interventions: CABG in 2008. Comparison Study: None.                   CT of the abdomen in 10/2018 showed aortoiliac                   atherosclerosis. Performing Technologist: [employer] BS, RVT, RDCS  Examination Guidelines: A complete evaluation includes at minimum, Doppler waveform signals and systolic blood pressure reading at the level of bilateral brachial, anterior tibial, and posterior tibial arteries, when vessel segments are accessible. Bilateral testing is considered an integral part of a complete examination. Photoelectric Plethysmograph (PPG) waveforms and toe systolic pressure readings are included as required and additional duplex testing as needed. Limited examinations for reoccurring indications may be performed as  noted.  ABI Findings: +---------+------------------+-----+-------------------+-----------------------+ Right    Rt Pressure (mmHg)IndexWaveform           Comment                 +---------+------------------+-----+-------------------+-----------------------+ Brachial 178                                                               +---------+------------------+-----+-------------------+-----------------------+  CFA                             biphasic           poor quality waveform,                                                     sounds triphasic        +---------+------------------+-----+-------------------+-----------------------+ Popliteal                       monophasic                                 +---------+------------------+-----+-------------------+-----------------------+ ATA      37                0.21 dampened monophasic                        +---------+------------------+-----+-------------------+-----------------------+ PTA      81                0.46 monophasic                                 +---------+------------------+-----+-------------------+-----------------------+ PERO     40                0.22 dampened monophasic                        +---------+------------------+-----+-------------------+-----------------------+ Great Toe                       Absent                                     +---------+------------------+-----+-------------------+-----------------------+ +---------+------------------+-----+----------+--------------------------------+ Left     Lt Pressure (mmHg)IndexWaveform  Comment                          +---------+------------------+-----+----------+--------------------------------+ Brachial 173                                                               +---------+------------------+-----+----------+--------------------------------+ CFA                             biphasic  poor quality  waveform, sounds                                              triphasic                        +---------+------------------+-----+----------+--------------------------------+ Popliteal  monophasic                                 +---------+------------------+-----+----------+--------------------------------+ ATA      90                0.51 monophasic                                 +---------+------------------+-----+----------+--------------------------------+ PTA      82                0.46 monophasic                                 +---------+------------------+-----+----------+--------------------------------+ PERO     96                0.54 monophasic                                 +---------+------------------+-----+----------+--------------------------------+ Great Toe61                0.34                                            +---------+------------------+-----+----------+--------------------------------+ +-------+-----------+-----------+------------+------------+ ABI/TBIToday's ABIToday's TBIPrevious ABIPrevious TBI +-------+-----------+-----------+------------+------------+ Right  .46        0                                   +-------+-----------+-----------+------------+------------+ Left   .54        .34                                 +-------+-----------+-----------+------------+------------+  Summary: Right: Resting right ankle-brachial index indicates severe right lower extremity arterial disease. The right toe-brachial index is abnormal. Left: Resting left ankle-brachial index indicates moderate left lower extremity arterial disease. The left toe-brachial index is abnormal.  *See table(s) above for measurements and observations.  Vascular consult recommended. Appointment with Dr. Gwenlyn Found scheduled Electronically signed by Jenkins Rouge MD on 01/01/2020 at 2:55:25 PM.01/05/2020 at 3:30PM    Final    VAS [place] LOWER  EXTREMITY ARTERIAL DUPLEX  Result Date: 01/01/2020 LOWER EXTREMITY ARTERIAL DUPLEX STUDY Indications: Rest pain, peripheral artery disease, and c/o a sore on his right              big toe and pain in the right foot. He does not have typical              symptoms of calf claudication. High Risk Factors: Hypertension, hyperlipidemia, Diabetes, no history of                    smoking, prior MI, coronary artery disease. Other Factors: Dark discoloration of the tip of the right great toe, and                erythema of the entire forefoot.                 SEE LEA DOPPLER REPORT.  Vascular  Interventions: CABG 12/2012. Current ABI:            Right .46, left .54. Comparison Study: CT of the abdomen in 10/2018 showed aortoiliac                   atherosclerosis. Performing Technologist: [employer] BS, RVT, RDCS  Examination Guidelines: A complete evaluation includes B-mode imaging, spectral Doppler, color Doppler, and power Doppler as needed of all accessible portions of each vessel. Bilateral testing is considered an integral part of a complete examination. Limited examinations for reoccurring indications may be performed as noted. Aorta: +------+-------+----------+----------+---------+--------+-----+       AP (cm)Trans (cm)PSV (cm/s)Waveform ThrombusShape +------+-------+----------+----------+---------+--------+-----+ Mid                    96        biphasic               +------+-------+----------+----------+---------+--------+-----+ Distal                 130       triphasic              +------+-------+----------+----------+---------+--------+-----+ Poor visualization of the abdominal aorta due to patient not NPO and having just eaten lunch. Proximal aorta is not seen. No obvious stenosis in the aorta or iliac arteries. Calcification seen throughout.  +-----------+--------+-----+--------+-------------------+----------------------+ RIGHT      PSV cm/sRatioStenosisWaveform            Comments               +-----------+--------+-----+--------+-------------------+----------------------+ CIA Prox   71                   biphasic                                  +-----------+--------+-----+--------+-------------------+----------------------+ CIA Mid    92                   biphasic                                  +-----------+--------+-----+--------+-------------------+----------------------+ CIA Distal 117                  biphasic                                  +-----------+--------+-----+--------+-------------------+----------------------+ EIA Prox   64                   triphasic                                 +-----------+--------+-----+--------+-------------------+----------------------+ EIA Mid    75                   biphasic                                  +-----------+--------+-----+--------+-------------------+----------------------+ EIA Distal 126                  biphasic                                  +-----------+--------+-----+--------+-------------------+----------------------+  CFA Prox   138                  triphasic                                 +-----------+--------+-----+--------+-------------------+----------------------+ DFA        126                  triphasic                                 +-----------+--------+-----+--------+-------------------+----------------------+ SFA Prox   144                  to and fro         trickle flow           +-----------+--------+-----+--------+-------------------+----------------------+ SFA Mid                 occluded                                          +-----------+--------+-----+--------+-------------------+----------------------+ SFA Distal              occluded                                          +-----------+--------+-----+--------+-------------------+----------------------+ POP Prox   15                   monophasic                                 +-----------+--------+-----+--------+-------------------+----------------------+ POP Mid    27                   monophasic                                +-----------+--------+-----+--------+-------------------+----------------------+ POP Distal 18                   monophasic                                +-----------+--------+-----+--------+-------------------+----------------------+ TP Trunk   38                   monophasic                                +-----------+--------+-----+--------+-------------------+----------------------+ ATA Prox   29                   monophasic                                +-----------+--------+-----+--------+-------------------+----------------------+ ATA Mid                 occluded                                          +-----------+--------+-----+--------+-------------------+----------------------+  ATA Distal              occluded                                          +-----------+--------+-----+--------+-------------------+----------------------+ PTA Prox   15                   dampened monophasic                       +-----------+--------+-----+--------+-------------------+----------------------+ PTA Mid    11           occluded                   trickle flow - likely                                                     segmental occlusion    +-----------+--------+-----+--------+-------------------+----------------------+ PTA Distal 23                   dampened monophasic                       +-----------+--------+-----+--------+-------------------+----------------------+ PERO Prox  12                   monophasic                                +-----------+--------+-----+--------+-------------------+----------------------+ PERO Mid   17           occluded                                           +-----------+--------+-----+--------+-------------------+----------------------+ PERO Distal             occluded                                          +-----------+--------+-----+--------+-------------------+----------------------+ Flash artifact color Doppler in the abdomen. The SFA has trickle flow in the proximal segment, then occludes a few centimeters past the ostium; it reconstitutes via collaterals in the far distal SFA/proximal popliteal artery. Tibial vessels are diminutive in size, and not well delineated. Based on waveforms and Doppler pressures, likely segmental occlusions of all three.  +----------+--------+-----+---------------+----------+-------------------------+ LEFT      PSV cm/sRatioStenosis       Waveform  Comments                  +----------+--------+-----+---------------+----------+-------------------------+ CIA Prox  95                          biphasic                            +----------+--------+-----+---------------+----------+-------------------------+ CIA Mid   128  biphasic                            +----------+--------+-----+---------------+----------+-------------------------+ CIA Distal121                         biphasic                            +----------+--------+-----+---------------+----------+-------------------------+ EIA Prox  81                          biphasic                            +----------+--------+-----+---------------+----------+-------------------------+ EIA Mid   51                          biphasic                            +----------+--------+-----+---------------+----------+-------------------------+ EIA Distal90                          biphasic                            +----------+--------+-----+---------------+----------+-------------------------+ CFA Prox  103                         triphasic                            +----------+--------+-----+---------------+----------+-------------------------+ DFA       131                         triphasic                           +----------+--------+-----+---------------+----------+-------------------------+ SFA Prox  320          50-74% stenosis          trickle flow followed by                                                  proximal occlusion        +----------+--------+-----+---------------+----------+-------------------------+ SFA Mid                occluded                                           +----------+--------+-----+---------------+----------+-------------------------+ SFA Distal40                          monophasicreconstitutes via  collateral                +----------+--------+-----+---------------+----------+-------------------------+ POP Prox  40                          monophasic                          +----------+--------+-----+---------------+----------+-------------------------+ POP Mid   20                          monophasic                          +----------+--------+-----+---------------+----------+-------------------------+ POP Distal19                          monophasic                          +----------+--------+-----+---------------+----------+-------------------------+ TP Trunk  20                          monophasic                          +----------+--------+-----+---------------+----------+-------------------------+ PTA Prox  23                          monophasic                          +----------+--------+-----+---------------+----------+-------------------------+ PTA Mid                occluded                                           +----------+--------+-----+---------------+----------+-------------------------+ PTA Distal             occluded                                            +----------+--------+-----+---------------+----------+-------------------------+ PERO Prox 15                          monophasic                          +----------+--------+-----+---------------+----------+-------------------------+ PERO Mid  15                          monophasic                          +----------+--------+-----+---------------+----------+-------------------------+ A focal velocity elevation of 320 cm/s was obtained at ostial SFA with a VR of 3.1. Findings are characteristic of 50-74% stenosis. SFA is occluded in the proximal thigh, and reconstitutes distally via collateral. Severely calcified tibial arteries, with  non-visualization of the anterior tibial artery.  Summary: Right: Total occlusion noted in the superficial femoral artery. Total occlusion noted in the anterior tibial artery. Total occlusion noted in the posterior tibial  artery. Total occlusion noted in the peroneal artery. Trickle flow in the proximal few centimeters of the SFA, followed by long occlusion and reconstitution in the distal SFA/proximal popliteal artery. Left: Total occlusion noted in the superficial femoral artery. Total occlusion noted in the posterior tibial artery. Total occlusion noted in the peroneal artery. Ostial SFA stenosis followed by occlusion, and reconstitution in the distal SFA.  See table(s) above for measurements and observations. Vascular consult recommended. Electronically signed by Jenkins Rouge MD on 01/01/2020 at 3:35:15 PM.    Final    Disposition   Pt is being discharged home today in good condition.  Follow-up Plans & Appointments    Follow-up Information    Liliane Shi, PA-C Follow up on 01/20/2020.   Specialties: Cardiology, Physician Assistant Why: at 11:00am for your follow up appt. Contact information: [address] [place] [address] [phone]          Discharge Instructions    Amb Referral to Cardiac Rehabilitation    Complete by: As directed    Diagnosis:  Coronary Stents NSTEMI     After initial evaluation and assessments completed: Virtual Based Care may be provided alone or in conjunction with Phase 2 Cardiac Rehab based on patient barriers.: Yes   Call MD for:  redness, tenderness, or signs of infection (pain, swelling, redness, odor or green/yellow discharge around incision site)   Complete by: As directed    Diet - low sodium heart healthy   Complete by: As directed    Discharge instructions   Complete by: As directed    Radial Site Care Refer to this sheet in the next few weeks. These instructions provide you with information on caring for yourself after your procedure. Your caregiver may also give you more specific instructions. Your treatment has been planned according to current medical practices, but problems sometimes occur. Call your caregiver if you have any problems or questions after your procedure. HOME CARE INSTRUCTIONS You may shower the day after the procedure.Remove the bandage (dressing) and gently wash the site with plain soap and water.Gently pat the site dry.  Do not apply powder or lotion to the site.  Do not submerge the affected site in water for 3 to 5 days.  Inspect the site at least twice daily.  Do not flex or bend the affected arm for 24 hours.  No lifting over 5 pounds (2.3 kg) for 5 days after your procedure.  Do not drive home if you are discharged the same day of the procedure. Have someone else drive you.  You may drive 24 hours after the procedure unless otherwise instructed by your caregiver.  What to expect: Any bruising will usually fade within 1 to 2 weeks.  Blood that collects in the tissue (hematoma) may be painful to the touch. It should usually decrease in size and tenderness within 1 to 2 weeks.  SEEK IMMEDIATE MEDICAL CARE IF: You have unusual pain at the radial site.  You have redness, warmth, swelling, or pain at the radial site.  You have drainage  (other than a small amount of blood on the dressing).  You have chills.  You have a fever or persistent symptoms for more than 72 hours.  You have a fever and your symptoms suddenly get worse.  Your arm becomes pale, cool, tingly, or numb.  You have heavy bleeding from the site. Hold pressure on the site.   PLEASE DO NOT MISS ANY DOSES OF YOUR EFFIENT!!!!!  Also keep a log of you blood pressures and bring back to your follow up appt. Please call the office with any questions.   Patients taking blood thinners should generally stay away from medicines like ibuprofen, Advil, Motrin, naproxen, and Aleve due to risk of stomach bleeding. You may take Tylenol as directed or talk to your primary doctor about alternatives. .   Increase activity slowly   Complete by: As directed        Discharge Medications     Medication List    STOP taking these medications   atorvastatin 80 MG tablet Commonly known as: LIPITOR   clopidogrel 75 MG tablet Commonly known as: PLAVIX     TAKE these medications   amLODipine 10 MG tablet Commonly known as: NORVASC TAKE ONE TABLET BY MOUTH ONCE DAILY.   aspirin EC 81 MG tablet Take 81 mg by mouth daily.   Basaglar KwikPen 100 UNIT/ML Sopn Inject 40 Units into the skin at bedtime.   CoQ10 100 MG Caps Take 100 mg by mouth daily.   diphenhydramine-acetaminophen 25-500 MG Tabs tablet Commonly known as: TYLENOL PM Take 2 tablets by mouth at bedtime.   ezetimibe 10 MG tablet Commonly known as: ZETIA Take 1 tablet (10 mg total) by mouth daily.   Farxiga 10 MG Tabs tablet Generic drug: dapagliflozin propanediol Take 10 mg by mouth daily.   FISH OIL PO Take 1 capsule by mouth daily.   gabapentin 300 MG capsule Commonly known as: NEURONTIN Take 300 mg by mouth at bedtime.   hydrochlorothiazide 25 MG tablet Commonly known as: HYDRODIURIL Take 1 tablet (25 mg total) by mouth daily.   insulin aspart protamine- aspart (70-30) 100 UNIT/ML  injection Commonly known as: NOVOLOG MIX 70/30 Inject 20 Units into the skin 3 (three) times daily with meals.   isosorbide mononitrate 30 MG 24 hr tablet Commonly known as: IMDUR Take 30 mg by mouth daily.   Januvia 100 MG tablet Generic drug: sitaGLIPtin Take 100 mg by mouth daily.   loperamide 2 MG tablet Commonly known as: Imodium A-D Take 2 capsules after first loose stool and 1 capsule after each loose stool thereafter.   metFORMIN 500 MG tablet Commonly known as: GLUCOPHAGE Take 500 mg by mouth 2 (two) times daily.   metoprolol succinate 100 MG 24 hr tablet Commonly known as: TOPROL-XL Take 1 tablet (100 mg total) by mouth 2 (two) times daily. Take with or immediately following a meal.   nitroGLYCERIN 0.4 MG SL tablet Commonly known as: NITROSTAT Place 1 tablet (0.4 mg total) under the tongue every 5 (five) minutes x 3 doses as needed for chest pain.   pantoprazole 40 MG tablet Commonly known as: PROTONIX Take 40 mg by mouth daily.   prasugrel 10 MG Tabs tablet Commonly known as: EFFIENT Take 1 tablet (10 mg total) by mouth daily. Start taking on: January 07, 2020   ramipril 10 MG capsule Commonly known as: ALTACE Take 10 mg by mouth daily.   rosuvastatin 40 MG tablet Commonly known as: Crestor Take 1 tablet (40 mg total) by mouth at bedtime.       Yes                               AHA/ACC Clinical Performance & Quality Measures: 1. Aspirin prescribed? - Yes 2. ADP Receptor Inhibitor (Plavix/Clopidogrel, Brilinta/Ticagrelor or Effient/Prasugrel) prescribed (includes medically managed patients)? - Yes 3. Beta Blocker prescribed? -  Yes 4. High Intensity Statin (Lipitor 40-80mg  or Crestor 20-40mg ) prescribed? - Yes 5. EF assessed during THIS hospitalization? - Yes 6. For EF <40%, was ACEI/ARB prescribed? - Yes 7. For EF <40%, Aldosterone Antagonist (Spironolactone or Eplerenone) prescribed? - Not Applicable (EF >/= 01%) 8. Cardiac Rehab Phase II ordered  (Included Medically managed Patients)? - Yes      Outstanding Labs/Studies   Outpatient lipid clinic referral  Duration of Discharge Encounter   Greater than 30 minutes including physician time.  Signed, Reino Bellis NP-C 01/06/2020, 12:54 PM   I have personally seen and examined this patient. I agree with the assessment and plan as outlined above.  He is doing well this am. H/H stable. No chest pain. S/P PCI of the arterial graft to the PDA.  OK to d/c home today on ASA, Effient, statin, beta blocker.  Follow up Dr. Burt Knack.   Lauree Chandler 01/06/2020 12:54 PM

## 2020-01-11 ENCOUNTER — Telehealth (HOSPITAL_COMMUNITY): Payer: Self-pay

## 2020-01-11 NOTE — Telephone Encounter (Signed)
Detailed instructions left for the patient on his cell phone. Asked to call back with any questions. S.Rosamond Andress EMTP

## 2020-01-14 ENCOUNTER — Encounter (HOSPITAL_COMMUNITY): Payer: 59

## 2020-01-19 NOTE — Progress Notes (Deleted)
Cardiology Office Note:    Date:  01/19/2020   ID:  Ian [name], DOB 03/12/1956, MRN [id number]  PCP:  Sharilyn Sites, MD  Cardiologist:  Sherren Mocha, MD *** Electrophysiologist:  None   Referring MD: Sharilyn Sites, MD   Chief Complaint:  No chief complaint on file.    Patient Profile:    Ian [name] is a 64 y.o. male with:   Coronary artery disease   S/p CABG in 2008  S/p STEMI in 08/2019 >> PCI: DES x 2 to radial-PDA  S/p staged PCI: orbital atherectomy, DES x 2 to protected LM in the LCx  Metastatic colon CA (mets to liver)  S/p partial colectomy and wedge resection of liver  Diabetes mellitus 2   Hypertension   Peripheral neuropathy   GERD  Prior CV studies: *** Echocardiogram 01/04/2020 EF 45-50, mild LVH, Gr 2 DD, normal RVSF, mild to mod LAE, mild MR, mild AS (mean gradient 6 mmHg)  Cardiac catheterization 01/04/2020 LM mid stent patent LAD ost 100 LCx stent patent with ost 40 ISR; OM3 100 w/ L-L collats RCA prox 100 L Radial-RPDA ost 95, prox 75, mid stent patent w/ 30 ISR S-OM2 100 S-OM1 100 L-LAD patent PCI:  3 x 38 mm Resolute Onyx DES to L Radial-PDA     LE Arterial US and ABIs 01/01/2020 R 0.46; L 0.54  Right: Total occlusion noted in the superficial femoral artery. Total  occlusion noted in the anterior tibial artery. Total occlusion noted in  the posterior tibial artery. Total occlusion noted in the peroneal artery.  Trickle flow in the proximal few centimeters of the SFA, followed by long occlusion and reconstitution in the distal SFA/proximal popliteal artery.   Left: Total occlusion noted in the superficial femoral artery. Total  occlusion noted in the posterior tibial artery. Total occlusion noted in  the peroneal artery. Ostial SFA stenosis followed by occlusion, and  reconstitution in the distal SFA.  Echocardiogram 09/14/2019 EF 60-65, Gr 1 DD  History of Present Illness:    Ian [name] was last seen by Dr. Burt Knack  01/01/2020.  He complained of back pain and chest pain.  His Ticagrelor was Clopidogrel.  He had a R great toe ulcer.  ABIs and LE arterial US was abnormal with evidence of a total occlusion of both SFAs and significant distal vessel disease.  He was referred to see Dr. Gwenlyn Found.  A Lexiscan Myoview was also arranged.  However, he presented to the hospital on 01/04/2020 with an inf-lat STEMI.  Cardiac catheterization demonstrated multivessel coronary artery disease with patent stent in the LM/LCx and focal ISR in the L Radial-PDA which was tx with a DES.  There was non-obstructive ostial LCx restenosis (IVUS).  Echocardiogram demonstrated that his EF is down somewhat at 45-50.  His Atorvastatin was changed to Rosuvastatin and Ezetimibe due to myalgias.  He was also changed to Prasugrel.    The DICTATELATER SmartLink is not supported in this context. ***   Past Medical History:  Diagnosis Date  . 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 Colon cancer ([place])   . Complication of anesthesia    atypical pseudo-cholinesterase deficiency  . Coronary artery disease involving native coronary artery of native heart with unstable angina pectoris ([place]) 01/10/2013    A) Mv CAD- CABGx 4 (LIMA-LAD, SVG-rPDA, SVG-OM1, SVG-OM2z);09/14/19: NSTEMI - Cath: p-mRCA, Ost-prox LAD, OM3 & SVG-OM1, SVG-OM2 100% CTO; Patentl LIMA-LAD. m-dLM 95% - p-mCx 90%, m-d Cx 90%. 2 Site DES PCI: LRad-RPDA -  prox 95% (Resolute Onyx DES 2.75 x 15) , m-d 99% (Resolute Onyx 2.75 x 22); c) 09/16/19: Orbital Atherectomy LM-p-m LCx - Overlapping DES 2.5 x 34 ( LCx) -> 3 x 30 (LM-LCx)  . Diabetes mellitus without complication ([place])   . Family history of adverse reaction to anesthesia    sister also has atypical pseudo-cholinesterase deficiency  . GERD (gastroesophageal reflux disease)   . History of heart bypass surgery   . Hypertension     Current Medications: No outpatient medications have been marked as taking for the 01/20/20 encounter  (Appointment) with Richardson Dopp T, PA-C.     Allergies:   Anectine [succinylcholine] and Nsaids   Social History   Tobacco Use  . Smoking status: Never Smoker  . Smokeless tobacco: Never Used  Substance Use Topics  . Alcohol use: No  . Drug use: No     Family Hx: The patient's family history includes Diabetes in his brother, brother, father, sister, and sister; Heart disease in his brother and father; Hypertension in his brother, father, sister, and son; Lymphoma in his brother.  ROS   EKGs/Labs/Other Test Reviewed:    EKG:  EKG is *** ordered today.  The ekg ordered today demonstrates ***  Recent Labs: 09/13/2019: ALT 28 09/14/2019: B Natriuretic Peptide 141.9 01/06/2020: BUN 25; Creatinine, Ser 1.12; Hemoglobin 12.6; Magnesium 2.1; Platelets 268; Potassium 4.4; Sodium 142   Recent Lipid Panel Lab Results  Component Value Date/Time   CHOL 185 01/04/2020 05:02 PM   TRIG 306 (H) 01/04/2020 05:02 PM   HDL 22 (L) 01/04/2020 05:02 PM   CHOLHDL 8.4 01/04/2020 05:02 PM   LDLCALC 102 (H) 01/04/2020 05:02 PM   LDLDIRECT 141.4 (H) 09/14/2019 02:43 AM    Physical Exam:    VS:  There were no vitals taken for this visit.    Wt Readings from Last 3 Encounters:  01/06/20 197 lb 11.2 oz (89.7 kg)  01/01/20 203 lb 12.8 oz (92.4 kg)  09/24/19 196 lb 3.2 oz (89 kg)     Physical Exam ***  ASSESSMENT & PLAN:    ***  Dispo:  No follow-ups on file.   Medication Adjustments/Labs and Tests Ordered: Current medicines are reviewed at length with the patient today.  Concerns regarding medicines are outlined above.  Tests Ordered: No orders of the defined types were placed in this encounter.  Medication Changes: No orders of the defined types were placed in this encounter.   Signed, Richardson Dopp, PA-C  01/19/2020 9:06 PM    [facility] [address], [place], [place]  [address] Phone: [phone]; Fax: [phone]

## 2020-01-20 ENCOUNTER — Ambulatory Visit: Payer: 59 | Admitting: Physician Assistant

## 2020-02-08 NOTE — Progress Notes (Signed)
Cardiology Office Note:    Date:  02/09/2020   ID:  Ian [name], DOB January 07, 1956, MRN [id number]  PCP:  Sharilyn Sites, MD  Cardiologist:  Sherren Mocha, MD   Electrophysiologist:  None   Referring MD: Sharilyn Sites, MD   Chief Complaint:  Hospitalization Follow-up (s/p MI >> PCI to Radial-PDA graft)    Patient Profile:    Ian [name] is a 64 y.o. male with:   Coronary artery disease   S/p CABG in 2008  S/p STEMI in 08/2019 >> PCI: DES x 2 to radial-PDA  S/p staged PCI: orbital atherectomy, DES x 2 to protected LM in the LCx  S/p inf-lat STEMI 12/2019 >> DES to Radial-PDA  Ischemic CM  Echocardiogram 12/2019: EF 45-50, Gr 2 DD, mild MR, mild AS (mean 6)  Metastatic colon CA (mets to liver)  S/p partial colectomy and wedge resection of liver  Diabetes mellitus 2   Hypertension   Peripheral neuropathy   GERD  Prior CV studies:   Echocardiogram 01/04/2020 EF 45-50, mild LVH, Gr 2 DD, normal RVSF, mild to mod LAE, mild MR, mild AS (mean gradient 6 mmHg)  Cardiac catheterization 01/04/2020 LM mid stent patent LAD ost 100 LCx stent patent with ost 40 ISR; OM3 100 w/ L-L collats RCA prox 100 L Radial-RPDA ost 95, prox 75, mid stent patent w/ 30 ISR S-OM2 100 S-OM1 100 L-LAD patent PCI:  3 x 38 mm Resolute Onyx DES to L Radial-PDA     LE Arterial US and ABIs 01/01/2020 R 0.46; L 0.54  Right: Total occlusion noted in the superficial femoral artery. Total  occlusion noted in the anterior tibial artery. Total occlusion noted in  the posterior tibial artery. Total occlusion noted in the peroneal artery.  Trickle flow in the proximal few centimeters of the SFA, followed by long occlusion and reconstitution in the distal SFA/proximal popliteal artery.   Left: Total occlusion noted in the superficial femoral artery. Total  occlusion noted in the posterior tibial artery. Total occlusion noted in  the peroneal artery. Ostial SFA stenosis followed by  occlusion, and  reconstitution in the distal SFA.  Echocardiogram 09/14/2019 EF 60-65, Gr 1 DD  History of Present Illness:    Mr. [name] was last seen by Dr. Burt Knack 01/01/2020.  He complained of back pain and chest pain.  His Ticagrelor was Clopidogrel.  He had a R great toe ulcer.  ABIs and LE arterial US was abnormal with evidence of a total occlusion of both SFAs and significant distal vessel disease.  He was referred to see Dr. Gwenlyn Found.  A Lexiscan Myoview was also arranged.  However, he presented to the hospital on 01/04/2020 with an inf-lat STEMI.  Cardiac catheterization demonstrated multivessel coronary artery disease with patent stent in the LM/LCx and focal ISR in the L Radial-PDA which was tx with a DES.  There was non-obstructive ostial LCx restenosis (IVUS).  Echocardiogram demonstrated that his EF is down somewhat at 45-50.  His Atorvastatin was changed to Rosuvastatin and Ezetimibe due to myalgias.  He was also changed to Prasugrel.    He returns for follow-up.  He is here alone.  Since discharge from the hospital, he has not had further chest discomfort.  He has not had significant shortness of breath.  He has not had syncope, orthopnea, significant lower extremity swelling.  He continues to have significant claudication symptoms with exertion and at rest.  His right great toe ulcer has not significantly changed.  He  has an appt with Vascular Surgery (Dr. Donnetta Hutching) next week.    Past Medical History:  Diagnosis Date  . 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 Colon cancer (Ian [name])   . Complication of anesthesia    atypical pseudo-cholinesterase deficiency  . Coronary artery disease involving native coronary artery of native heart with unstable angina pectoris (Ian [name]) 01/10/2013    A) Mv CAD- CABGx 4 (LIMA-LAD, SVG-rPDA, SVG-OM1, SVG-OM2z);09/14/19: NSTEMI - Cath: p-mRCA, Ost-prox LAD, OM3 & SVG-OM1, SVG-OM2 100% CTO; Patentl LIMA-LAD. m-dLM 95% - p-mCx 90%, m-d Cx 90%. 2 Site DES PCI: LRad-RPDA  - prox 95% (Resolute Onyx DES 2.75 x 15) , m-d 99% (Resolute Onyx 2.75 x 22); c) 09/16/19: Orbital Atherectomy LM-p-m LCx - Overlapping DES 2.5 x 34 ( LCx) -> 3 x 30 (LM-LCx)  . Diabetes mellitus without complication (Ian [name])   . Family history of adverse reaction to anesthesia    sister also has atypical pseudo-cholinesterase deficiency  . GERD (gastroesophageal reflux disease)   . History of heart bypass surgery   . Hypertension     Current Medications: Current Meds  Medication Sig  . amLODipine (NORVASC) 10 MG tablet TAKE ONE TABLET BY MOUTH ONCE DAILY.  Marland Kitchen aspirin EC 81 MG tablet Take 81 mg by mouth daily.  . Coenzyme Q10 (COQ10) 100 MG CAPS Take 100 mg by mouth daily.  . diphenhydramine-acetaminophen (TYLENOL PM) 25-500 MG TABS tablet Take 2 tablets by mouth at bedtime.  Marland Kitchen ezetimibe (ZETIA) 10 MG tablet Take 1 tablet (10 mg total) by mouth daily.  Marland Kitchen FARXIGA 10 MG TABS tablet Take 10 mg by mouth daily.   Marland Kitchen gabapentin (NEURONTIN) 300 MG capsule Take 300 mg by mouth at bedtime.  . hydrochlorothiazide (HYDRODIURIL) 25 MG tablet Take 1 tablet (25 mg total) by mouth daily.  . insulin aspart protamine- aspart (NOVOLOG MIX 70/30) (70-30) 100 UNIT/ML injection Inject 20 Units into the skin 3 (three) times daily with meals.  . Insulin Glargine (BASAGLAR KWIKPEN) 100 UNIT/ML SOPN Inject 40 Units into the skin at bedtime.   Marland Kitchen JANUVIA 100 MG tablet Take 100 mg by mouth daily.   . metFORMIN (GLUCOPHAGE) 500 MG tablet Take 500 mg by mouth 2 (two) times daily.  . metoprolol succinate (TOPROL-XL) 100 MG 24 hr tablet Take 1 tablet (100 mg total) by mouth 2 (two) times daily. Take with or immediately following a meal.  . nitroGLYCERIN (NITROSTAT) 0.4 MG SL tablet Place 1 tablet (0.4 mg total) under the tongue every 5 (five) minutes x 3 doses as needed for chest pain.  . Omega-3 Fatty Acids (FISH OIL PO) Take 1 capsule by mouth daily.  . pantoprazole (PROTONIX) 40 MG tablet Take 40 mg by mouth daily.  .  prasugrel (EFFIENT) 10 MG TABS tablet Take 1 tablet (10 mg total) by mouth daily.  . ramipril (ALTACE) 10 MG capsule Take 10 mg by mouth daily.  . rosuvastatin (CRESTOR) 40 MG tablet Take 1 tablet (40 mg total) by mouth at bedtime.  . [DISCONTINUED] isosorbide mononitrate (IMDUR) 30 MG 24 hr tablet Take 30 mg by mouth daily.  . [DISCONTINUED] nitroGLYCERIN (NITROSTAT) 0.4 MG SL tablet Place 1 tablet (0.4 mg total) under the tongue every 5 (five) minutes x 3 doses as needed for chest pain.     Allergies:   Anectine [succinylcholine] and Nsaids   Social History   Tobacco Use  . Smoking status: Never Smoker  . Smokeless tobacco: Never Used  Substance Use Topics  . Alcohol use: No  .  Drug use: No     Family Hx: The patient's family history includes Diabetes in his brother, brother, father, sister, and sister; Heart disease in his brother and father; Hypertension in his brother, father, sister, and son; Lymphoma in his brother.  ROS   EKGs/Labs/Other Test Reviewed:    EKG:  EKG is   ordered today.  The ekg ordered today demonstrates normal sinus rhythm, heart rate 92, normal axis, T wave inversions 1, aVL, QTC 472, septal Q waves  Recent Labs: 09/13/2019: ALT 28 09/14/2019: B Natriuretic Peptide 141.9 01/06/2020: BUN 25; Creatinine, Ser 1.12; Hemoglobin 12.6; Magnesium 2.1; Platelets 268; Potassium 4.4; Sodium 142   Recent Lipid Panel Lab Results  Component Value Date/Time   CHOL 185 01/04/2020 05:02 PM   TRIG 306 (H) 01/04/2020 05:02 PM   HDL 22 (L) 01/04/2020 05:02 PM   CHOLHDL 8.4 01/04/2020 05:02 PM   LDLCALC 102 (H) 01/04/2020 05:02 PM   LDLDIRECT 141.4 (H) 09/14/2019 02:43 AM    Physical Exam:    VS:  BP 138/60   Pulse 97   Ht 5' (1.524 m)   Wt 193 lb 12.8 oz (87.9 kg)   SpO2 96%   BMI 37.85 kg/m     Wt Readings from Last 3 Encounters:  02/09/20 193 lb 12.8 oz (87.9 kg)  01/06/20 197 lb 11.2 oz (89.7 kg)  01/01/20 203 lb 12.8 oz (92.4 kg)      Constitutional:      Appearance: Healthy appearance. Not in distress.  Neck:     Vascular: JVD normal.  Pulmonary:     Breath sounds: No wheezing. No rales.  Cardiovascular:     Normal rate. Regular rhythm. Normal S1. Normal S2.     Murmurs: There is no murmur.     Comments: R FA site without hematoma or bruit  Edema:    Peripheral edema absent.  Abdominal:     Palpations: Abdomen is soft. There is no hepatomegaly.  Skin:    General: Skin is warm and dry.     Comments: Necrotic ulcer R great toe (sl larger than a nickel)  Neurological:     General: No focal deficit present.     Mental Status: Alert and oriented to person, place and time.     Cranial Nerves: Cranial nerves are intact.       ASSESSMENT & PLAN:    1. Coronary artery disease involving native coronary artery of native heart without angina pectoris History of CABG in 2008.  He suffered a myocardial infarction in October 2020 treated with a drug-eluting stent x2 to the radial graft to the PDA.  He underwent staged PCI with a drug-eluting stent x2 to a protected left main into the LCx.  Recently, he had an inferolateral ST elevation myocardial infarction February 2021 treated with a drug-eluting stent to the radial graft to the PDA secondary to in-stent restenosis.  EF is 45-50%.  He is currently doing well without recurrent anginal symptoms.  Continue amlodipine, aspirin, ezetimibe, isosorbide, metoprolol succinate, prasugrel, Ramipril, rosuvastatin.  2. PAD (peripheral artery disease) ([place]) He has a necrotic ulcer on his right great toe.  He has significant peripheral arterial disease on lower extremity arterial Dopplers and has an appointment next week with vascular surgery.  3. Type 2 diabetes mellitus with complication, with long-term current use of insulin (HCC) Recent A1c 9.8.  He has been placed on Trulicity by his primary care physician.  4. Essential hypertension Blood pressure above target.  Goal <  130/80.   Continue current dose of amlodipine, hydrochlorothiazide, metoprolol succinate, Ramipril.  Increase isosorbide to 60 mg daily.  5. Hyperlipidemia LDL goal <70 He is currently tolerating his regimen which includes rosuvastatin and ezetimibe.  Continue current therapy and arrange fasting lipids and LFTs in 3 months.   Dispo:  Return in about 3 months (around 05/11/2020) for Routine Follow Up, w/ Dr. Burt Knack, or Richardson Dopp, PA-C, in person.   Medication Adjustments/Labs and Tests Ordered: Current medicines are reviewed at length with the patient today.  Concerns regarding medicines are outlined above.  Tests Ordered: Orders Placed This Encounter  Procedures  . Lipid panel  . Hepatic function panel  . EKG 12-Lead   Medication Changes: Meds ordered this encounter  Medications  . nitroGLYCERIN (NITROSTAT) 0.4 MG SL tablet    Sig: Place 1 tablet (0.4 mg total) under the tongue every 5 (five) minutes x 3 doses as needed for chest pain.    Dispense:  75 tablet    Refill:  3  . isosorbide mononitrate (IMDUR) 60 MG 24 hr tablet    Sig: Take 1 tablet (60 mg total) by mouth daily.    Dispense:  90 tablet    Refill:  3    Signed, Richardson Dopp, PA-C  02/09/2020 9:01 AM    [address], [place], [place]  [address] Phone: [phone]; Fax: [phone]

## 2020-02-09 ENCOUNTER — Other Ambulatory Visit: Payer: Self-pay

## 2020-02-09 ENCOUNTER — Encounter: Payer: Self-pay | Admitting: Physician Assistant

## 2020-02-09 ENCOUNTER — Ambulatory Visit (INDEPENDENT_AMBULATORY_CARE_PROVIDER_SITE_OTHER): Payer: 59 | Admitting: Physician Assistant

## 2020-02-09 VITALS — BP 138/60 | HR 97 | Ht 60.0 in | Wt 193.8 lb

## 2020-02-09 DIAGNOSIS — I10 Essential (primary) hypertension: Secondary | ICD-10-CM

## 2020-02-09 DIAGNOSIS — I2510 Atherosclerotic heart disease of native coronary artery without angina pectoris: Secondary | ICD-10-CM | POA: Diagnosis not present

## 2020-02-09 DIAGNOSIS — E785 Hyperlipidemia, unspecified: Secondary | ICD-10-CM | POA: Diagnosis not present

## 2020-02-09 DIAGNOSIS — I739 Peripheral vascular disease, unspecified: Secondary | ICD-10-CM

## 2020-02-09 DIAGNOSIS — E118 Type 2 diabetes mellitus with unspecified complications: Secondary | ICD-10-CM

## 2020-02-09 DIAGNOSIS — Z794 Long term (current) use of insulin: Secondary | ICD-10-CM

## 2020-02-09 MED ORDER — ISOSORBIDE MONONITRATE ER 60 MG PO TB24: 60.0000 mg | ORAL_TABLET | Freq: Every day | ORAL | 3 refills | Status: DC

## 2020-02-09 MED ORDER — NITROGLYCERIN 0.4 MG SL SUBL: 0.4000 mg | SUBLINGUAL_TABLET | SUBLINGUAL | 3 refills | Status: DC | PRN

## 2020-02-09 NOTE — Patient Instructions (Addendum)
Medication Instructions:   Your physician has recommended you make the following change in your medication:   1) Increase Isosorbide to 60 mg, 1 tablet by mouth once a day  *If you need a refill on your cardiac medications before your next appointment, please call your pharmacy*  Lab Work:  Your physician recommends that you return for lab work in 2 months on 04/11/20 at 8:30AM  If you have labs (blood work) drawn today and your tests are completely normal, you will receive your results only by: Marland Kitchen MyChart Message (if you have MyChart) OR . A paper copy in the mail If you have any lab test that is abnormal or we need to change your treatment, we will call you to review the results.  Testing/Procedures:  None ordered today  Follow-Up: At [facility], you and your health needs are our priority.  As part of our continuing mission to provide you with exceptional heart care, we have created designated Provider Care Teams.  These Care Teams include your primary Cardiologist (physician) and Advanced Practice Providers (APPs -  Physician Assistants and Nurse Practitioners) who all work together to provide you with the care you need, when you need it.  We recommend signing up for the patient portal called "MyChart".  Sign up information is provided on this After Visit Summary.  MyChart is used to connect with patients for Virtual Visits (Telemedicine).  Patients are able to view lab/test results, encounter notes, upcoming appointments, etc.  Non-urgent messages can be sent to your provider as well.   To learn more about what you can do with MyChart, go to [email or web address].    Your next appointment:    On 05/13/20 at 9:15AM with Richardson Dopp, PA-C

## 2020-02-12 ENCOUNTER — Telehealth (HOSPITAL_COMMUNITY): Payer: Self-pay

## 2020-02-12 NOTE — Telephone Encounter (Signed)

## 2020-02-15 ENCOUNTER — Other Ambulatory Visit: Payer: Self-pay | Admitting: *Deleted

## 2020-02-15 DIAGNOSIS — I739 Peripheral vascular disease, unspecified: Secondary | ICD-10-CM

## 2020-02-15 DIAGNOSIS — R0989 Other specified symptoms and signs involving the circulatory and respiratory systems: Secondary | ICD-10-CM

## 2020-02-16 ENCOUNTER — Other Ambulatory Visit: Payer: Self-pay

## 2020-02-16 ENCOUNTER — Ambulatory Visit (HOSPITAL_COMMUNITY)
Admission: RE | Admit: 2020-02-16 | Discharge: 2020-02-16 | Disposition: A | Discharge status: Home / Self Care | Payer: Federal, State, Local not specified - PPO | Source: Ambulatory Visit | Attending: Vascular Surgery | Admitting: Vascular Surgery

## 2020-02-16 ENCOUNTER — Encounter: Payer: Self-pay | Admitting: Vascular Surgery

## 2020-02-16 ENCOUNTER — Ambulatory Visit (INDEPENDENT_AMBULATORY_CARE_PROVIDER_SITE_OTHER): Payer: 59 | Admitting: Vascular Surgery

## 2020-02-16 VITALS — BP 117/67 | HR 85 | Temp 98.1°F | Resp 18 | Ht 64.0 in | Wt 189.6 lb

## 2020-02-16 DIAGNOSIS — I739 Peripheral vascular disease, unspecified: Secondary | ICD-10-CM

## 2020-02-16 DIAGNOSIS — R0989 Other specified symptoms and signs involving the circulatory and respiratory systems: Secondary | ICD-10-CM | POA: Diagnosis not present

## 2020-02-16 DIAGNOSIS — I998 Other disorder of circulatory system: Secondary | ICD-10-CM | POA: Diagnosis not present

## 2020-02-16 DIAGNOSIS — I70229 Atherosclerosis of native arteries of extremities with rest pain, unspecified extremity: Secondary | ICD-10-CM

## 2020-02-16 NOTE — H&P (View-Only) (Signed)
[facility]  Patient name: Ian [name] MRN: [id number] DOB: 05/18/1956 Sex: male  REASON FOR CONSULT: Evaluation critical limb ischemia right foot  HPI: Ian [name] is a 64 y.o. male, who is here today for evaluation.  He has a very complicated past history.  Long history of diabetes.  He has never smoked.  He underwent coronary artery bypass grafting in 2008 for Jeronica Stlouis onset cardiac disease.  Is a history of colon cancer with metastasis to the liver which is been treated with chemotherapy and is in remission.  He has had a 65-month history of progressive discomfort in his right foot.  He has classic nocturnal arterial rest pain.  He has bilateral calf claudication symptoms.  Over the past several weeks he is developed more ruborous changes and has chronic dry gangrenous changes over the tip of his right great toe and also fourth toe tip  Past Medical History:  Diagnosis Date  . Caffeine dependence ([place]) 01/10/2013  . Colon cancer ([place])   . Complication of anesthesia    atypical pseudo-cholinesterase deficiency  . Coronary artery disease involving native coronary artery of native heart with unstable angina pectoris ([place]) 01/10/2013    A) Mv CAD- CABGx 4 (LIMA-LAD, SVG-rPDA, SVG-OM1, SVG-OM2z);09/14/19: NSTEMI - Cath: p-mRCA, Ost-prox LAD, OM3 & SVG-OM1, SVG-OM2 100% CTO; Patentl LIMA-LAD. m-dLM 95% - p-mCx 90%, m-d Cx 90%. 2 Site DES PCI: LRad-RPDA - prox 95% (Resolute Onyx DES 2.75 x 15) , m-d 99% (Resolute Onyx 2.75 x 22); c) 09/16/19: Orbital Atherectomy LM-p-m LCx - Overlapping DES 2.5 x 34 ( LCx) -> 3 x 30 (LM-LCx)  . Diabetes mellitus without complication ([place])   . Family history of adverse reaction to anesthesia    sister also has atypical pseudo-cholinesterase deficiency  . GERD (gastroesophageal reflux disease)   . History of heart bypass surgery   . Hypertension     Family History  Problem Relation Age of  Onset  . Hypertension Father   . Diabetes Father   . Heart disease Father   . Diabetes Sister   . Hypertension Sister   . Hypertension Brother   . Diabetes Brother   . Diabetes Sister   . Diabetes Brother   . Heart disease Brother   . Lymphoma Brother   . Hypertension Son     SOCIAL HISTORY: Social History   Socioeconomic History  . Marital status: Married    Spouse name: Not on file  . Number of children: 3  . Years of education: Not on file  . Highest education level: Not on file  Occupational History  . Occupation: Programmer, systems: [employer]  Tobacco Use  . Smoking status: Never Smoker  . Smokeless tobacco: Never Used  Substance and Sexual Activity  . Alcohol use: No  . Drug use: No  . Sexual activity: Yes    Birth control/protection: None  Other Topics Concern  . Not on file  Social History Narrative  . Not on file   Social Determinants of Health   Financial Resource Strain:   . Difficulty of Paying Living Expenses:   Food Insecurity:   . Worried About Charity fundraiser in the Last Year:   . Arboriculturist in the Last Year:   Transportation Needs:   . Film/video editor (Medical):   Marland Kitchen Lack of Transportation (Non-Medical):   Physical Activity:   . Days of Exercise per Week:   .  Minutes of Exercise per Session:   Stress:   . Feeling of Stress :   Social Connections:   . Frequency of Communication with Friends and Family:   . Frequency of Social Gatherings with Friends and Family:   . Attends Religious Services:   . Active Member of Clubs or Organizations:   . Attends Archivist Meetings:   Marland Kitchen Marital Status:   Intimate Partner Violence:   . Fear of Current or Ex-Partner:   . Emotionally Abused:   Marland Kitchen Physically Abused:   . Sexually Abused:     Allergies  Allergen Reactions  . Anectine [Succinylcholine] Other (See Comments)    Atypical pseudocholinesterase deficiency.   . Nsaids Other (See Comments)     Contraindicated (can only have asa 81mg )    Current Outpatient Medications  Medication Sig Dispense Refill  . amLODipine (NORVASC) 10 MG tablet TAKE ONE TABLET BY MOUTH ONCE DAILY. 30 tablet 0  . aspirin EC 81 MG tablet Take 81 mg by mouth daily.    . Coenzyme Q10 (COQ10) 100 MG CAPS Take 100 mg by mouth daily.    . diphenhydramine-acetaminophen (TYLENOL PM) 25-500 MG TABS tablet Take 2 tablets by mouth at bedtime.    Marland Kitchen ezetimibe (ZETIA) 10 MG tablet Take 1 tablet (10 mg total) by mouth daily. 30 tablet 1  . FARXIGA 10 MG TABS tablet Take 10 mg by mouth daily.     Marland Kitchen gabapentin (NEURONTIN) 300 MG capsule Take 300 mg by mouth at bedtime.    . insulin aspart protamine- aspart (NOVOLOG MIX 70/30) (70-30) 100 UNIT/ML injection Inject 20 Units into the skin 3 (three) times daily with meals.    . Insulin Glargine (BASAGLAR KWIKPEN) 100 UNIT/ML SOPN Inject 40 Units into the skin at bedtime.     . isosorbide mononitrate (IMDUR) 60 MG 24 hr tablet Take 1 tablet (60 mg total) by mouth daily. 90 tablet 3  . JANUVIA 100 MG tablet Take 100 mg by mouth daily.     . metFORMIN (GLUCOPHAGE) 500 MG tablet Take 500 mg by mouth 2 (two) times daily.    . metoprolol succinate (TOPROL-XL) 100 MG 24 hr tablet Take 1 tablet (100 mg total) by mouth 2 (two) times daily. Take with or immediately following a meal. 60 tablet 1  . nitroGLYCERIN (NITROSTAT) 0.4 MG SL tablet Place 1 tablet (0.4 mg total) under the tongue every 5 (five) minutes x 3 doses as needed for chest pain. 75 tablet 3  . Omega-3 Fatty Acids (FISH OIL PO) Take 1 capsule by mouth daily.    . pantoprazole (PROTONIX) 40 MG tablet Take 40 mg by mouth daily.    . prasugrel (EFFIENT) 10 MG TABS tablet Take 1 tablet (10 mg total) by mouth daily. 90 tablet 2  . ramipril (ALTACE) 10 MG capsule Take 10 mg by mouth daily.    . rosuvastatin (CRESTOR) 40 MG tablet Take 1 tablet (40 mg total) by mouth at bedtime. 90 tablet 0  . hydrochlorothiazide (HYDRODIURIL) 25 MG  tablet Take 1 tablet (25 mg total) by mouth daily. 90 tablet 3   No current facility-administered medications for this visit.    REVIEW OF SYSTEMS:  [X]  denotes positive finding, [ ]  denotes negative finding Cardiac  Comments:  Chest pain or chest pressure:    Shortness of breath upon exertion:    Short of breath when lying flat:    Irregular heart rhythm:        Vascular  Pain in calf, thigh, or hip brought on by ambulation: x   Pain in feet at night that wakes you up from your sleep:  x   Blood clot in your veins:    Leg swelling:         Pulmonary    Oxygen at home:    Productive cough:     Wheezing:         Neurologic    Sudden weakness in arms or legs:     Sudden numbness in arms or legs:     Sudden onset of difficulty speaking or slurred speech:    Temporary loss of vision in one eye:     Problems with dizziness:         Gastrointestinal    Blood in stool:     Vomited blood:         Genitourinary    Burning when urinating:     Blood in urine:        Psychiatric    Major depression:         Hematologic    Bleeding problems:    Problems with blood clotting too easily:        Skin    Rashes or ulcers:        Constitutional    Fever or chills:      PHYSICAL EXAM: Vitals:   02/16/20 1023  BP: 117/67  Pulse: 85  Resp: 18  Temp: 98.1 F (36.7 C)  TempSrc: Temporal  SpO2: 99%  Weight: 189 lb 9.6 oz (86 kg)  Height: 5\' 4"  (1.626 m)    GENERAL: The patient is a well-nourished male, in no acute distress. The vital signs are documented above. CARDIOVASCULAR: 2+ radial pulses bilaterally.  Palpable femoral pulses bilaterally.  Absent popliteal and distal pulses PULMONARY: There is good air exchange  ABDOMEN: Soft and non-tender  MUSCULOSKELETAL: There are no major deformities or cyanosis. NEUROLOGIC: No focal weakness or paresthesias are detected. SKIN: Dependent rubor throughout his right foot.  He does have a dry gangrene on the tip of his right  great toe and fourth toe. PSYCHIATRIC: The patient has a normal affect.  DATA:  Noninvasive studies on February of this year were reviewed.  This reveals evidence of SFA and tibial occlusive disease bilaterally.  Noninvasive studies in our office today revealed ankle arm index of 0.4 -0.5 bilaterally  MEDICAL ISSUES: Critical limb ischemia of right foot.  He has rest pain and tissue loss.  Explained that we will proceed urgently with arteriography for further evaluation.  He does have extensive disease therefore stenting will in all likelihood not be an option.  Explained decision would be made at the time of his diagnostic arteriogram.  We will proceed with that study this week.  Explained that that in all likelihood he may require a right femoral to popliteal or tibial bypass.  He has had vein harvested from his right and left thigh for bypass.  Will proceed with vein map if bypass is indicated.  He understands that this is limb threatening without revascularization.   Rosetta Posner, MD FACS [facility] Tel [phone] Pager [phone]

## 2020-02-16 NOTE — Progress Notes (Signed)
[facility] [place]  Patient name: Ian [name] MRN: [id number] DOB: 10-11-1956 Sex: male  REASON FOR CONSULT: Evaluation critical limb ischemia right foot  HPI: Ian [name] is a 64 y.o. male, who is here today for evaluation.  He has a very complicated past history.  Long history of diabetes.  He has never smoked.  He underwent coronary artery bypass grafting in 2008 for Jarett Dralle onset cardiac disease.  Is a history of colon cancer with metastasis to the liver which is been treated with chemotherapy and is in remission.  He has had a 74-month history of progressive discomfort in his right foot.  He has classic nocturnal arterial rest pain.  He has bilateral calf claudication symptoms.  Over the past several weeks he is developed more ruborous changes and has chronic dry gangrenous changes over the tip of his right great toe and also fourth toe tip  Past Medical History:  Diagnosis Date  . 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 Colon cancer ([place])   . Complication of anesthesia    atypical pseudo-cholinesterase deficiency  . Coronary artery disease involving native coronary artery of native heart with unstable angina pectoris ([place]) 01/10/2013    A) Mv CAD- CABGx 4 (LIMA-LAD, SVG-rPDA, SVG-OM1, SVG-OM2z);09/14/19: NSTEMI - Cath: p-mRCA, Ost-prox LAD, OM3 & SVG-OM1, SVG-OM2 100% CTO; Patentl LIMA-LAD. m-dLM 95% - p-mCx 90%, m-d Cx 90%. 2 Site DES PCI: LRad-RPDA - prox 95% (Resolute Onyx DES 2.75 x 15) , m-d 99% (Resolute Onyx 2.75 x 22); c) 09/16/19: Orbital Atherectomy LM-p-m LCx - Overlapping DES 2.5 x 34 ( LCx) -> 3 x 30 (LM-LCx)  . Diabetes mellitus without complication ([place])   . Family history of adverse reaction to anesthesia    sister also has atypical pseudo-cholinesterase deficiency  . GERD (gastroesophageal reflux disease)   . History of heart bypass surgery   . Hypertension     Family History  Problem Relation Age of  Onset  . Hypertension Father   . Diabetes Father   . Heart disease Father   . Diabetes Sister   . Hypertension Sister   . Hypertension Brother   . Diabetes Brother   . Diabetes Sister   . Diabetes Brother   . Heart disease Brother   . Lymphoma Brother   . Hypertension Son     SOCIAL HISTORY: Social History   Socioeconomic History  . Marital status: Married    Spouse name: Not on file  . Number of children: 3  . Years of education: Not on file  . Highest education level: Not on file  Occupational History  . Occupation: Programmer, systems: [employer]  Tobacco Use  . Smoking status: Never Smoker  . Smokeless tobacco: Never Used  Substance and Sexual Activity  . Alcohol use: No  . Drug use: No  . Sexual activity: Yes    Birth control/protection: None  Other Topics Concern  . Not on file  Social History Narrative  . Not on file   Social Determinants of Health   Financial Resource Strain:   . Difficulty of Paying Living Expenses:   Food Insecurity:   . Worried About Charity fundraiser in the Last Year:   . Arboriculturist in the Last Year:   Transportation Needs:   . Film/video editor (Medical):   Marland Kitchen Lack of Transportation (Non-Medical):   Physical Activity:   . Days of Exercise per Week:   .  Minutes of Exercise per Session:   Stress:   . Feeling of Stress :   Social Connections:   . Frequency of Communication with Friends and Family:   . Frequency of Social Gatherings with Friends and Family:   . Attends Religious Services:   . Active Member of Clubs or Organizations:   . Attends Archivist Meetings:   Marland Kitchen Marital Status:   Intimate Partner Violence:   . Fear of Current or Ex-Partner:   . Emotionally Abused:   Marland Kitchen Physically Abused:   . Sexually Abused:     Allergies  Allergen Reactions  . Anectine [Succinylcholine] Other (See Comments)    Atypical pseudocholinesterase deficiency.   . Nsaids Other (See Comments)     Contraindicated (can only have asa 81mg )    Current Outpatient Medications  Medication Sig Dispense Refill  . amLODipine (NORVASC) 10 MG tablet TAKE ONE TABLET BY MOUTH ONCE DAILY. 30 tablet 0  . aspirin EC 81 MG tablet Take 81 mg by mouth daily.    . Coenzyme Q10 (COQ10) 100 MG CAPS Take 100 mg by mouth daily.    . diphenhydramine-acetaminophen (TYLENOL PM) 25-500 MG TABS tablet Take 2 tablets by mouth at bedtime.    Marland Kitchen ezetimibe (ZETIA) 10 MG tablet Take 1 tablet (10 mg total) by mouth daily. 30 tablet 1  . FARXIGA 10 MG TABS tablet Take 10 mg by mouth daily.     Marland Kitchen gabapentin (NEURONTIN) 300 MG capsule Take 300 mg by mouth at bedtime.    . insulin aspart protamine- aspart (NOVOLOG MIX 70/30) (70-30) 100 UNIT/ML injection Inject 20 Units into the skin 3 (three) times daily with meals.    . Insulin Glargine (BASAGLAR KWIKPEN) 100 UNIT/ML SOPN Inject 40 Units into the skin at bedtime.     . isosorbide mononitrate (IMDUR) 60 MG 24 hr tablet Take 1 tablet (60 mg total) by mouth daily. 90 tablet 3  . JANUVIA 100 MG tablet Take 100 mg by mouth daily.     . metFORMIN (GLUCOPHAGE) 500 MG tablet Take 500 mg by mouth 2 (two) times daily.    . metoprolol succinate (TOPROL-XL) 100 MG 24 hr tablet Take 1 tablet (100 mg total) by mouth 2 (two) times daily. Take with or immediately following a meal. 60 tablet 1  . nitroGLYCERIN (NITROSTAT) 0.4 MG SL tablet Place 1 tablet (0.4 mg total) under the tongue every 5 (five) minutes x 3 doses as needed for chest pain. 75 tablet 3  . Omega-3 Fatty Acids (FISH OIL PO) Take 1 capsule by mouth daily.    . pantoprazole (PROTONIX) 40 MG tablet Take 40 mg by mouth daily.    . prasugrel (EFFIENT) 10 MG TABS tablet Take 1 tablet (10 mg total) by mouth daily. 90 tablet 2  . ramipril (ALTACE) 10 MG capsule Take 10 mg by mouth daily.    . rosuvastatin (CRESTOR) 40 MG tablet Take 1 tablet (40 mg total) by mouth at bedtime. 90 tablet 0  . hydrochlorothiazide (HYDRODIURIL) 25 MG  tablet Take 1 tablet (25 mg total) by mouth daily. 90 tablet 3   No current facility-administered medications for this visit.    REVIEW OF SYSTEMS:  [X]  denotes positive finding, [ ]  denotes negative finding Cardiac  Comments:  Chest pain or chest pressure:    Shortness of breath upon exertion:    Short of breath when lying flat:    Irregular heart rhythm:        Vascular  Pain in calf, thigh, or hip brought on by ambulation: x   Pain in feet at night that wakes you up from your sleep:  x   Blood clot in your veins:    Leg swelling:         Pulmonary    Oxygen at home:    Productive cough:     Wheezing:         Neurologic    Sudden weakness in arms or legs:     Sudden numbness in arms or legs:     Sudden onset of difficulty speaking or slurred speech:    Temporary loss of vision in one eye:     Problems with dizziness:         Gastrointestinal    Blood in stool:     Vomited blood:         Genitourinary    Burning when urinating:     Blood in urine:        Psychiatric    Major depression:         Hematologic    Bleeding problems:    Problems with blood clotting too easily:        Skin    Rashes or ulcers:        Constitutional    Fever or chills:      PHYSICAL EXAM: Vitals:   02/16/20 1023  BP: 117/67  Pulse: 85  Resp: 18  Temp: 98.1 F (36.7 C)  TempSrc: Temporal  SpO2: 99%  Weight: 189 lb 9.6 oz (86 kg)  Height: 5\' 4"  (1.626 m)    GENERAL: The patient is a well-nourished male, in no acute distress. The vital signs are documented above. CARDIOVASCULAR: 2+ radial pulses bilaterally.  Palpable femoral pulses bilaterally.  Absent popliteal and distal pulses PULMONARY: There is good air exchange  ABDOMEN: Soft and non-tender  MUSCULOSKELETAL: There are no major deformities or cyanosis. NEUROLOGIC: No focal weakness or paresthesias are detected. SKIN: Dependent rubor throughout his right foot.  He does have a dry gangrene on the tip of his right  great toe and fourth toe. PSYCHIATRIC: The patient has a normal affect.  DATA:  Noninvasive studies on February of this year were reviewed.  This reveals evidence of SFA and tibial occlusive disease bilaterally.  Noninvasive studies in our office today revealed ankle arm index of 0.4 -0.5 bilaterally  MEDICAL ISSUES: Critical limb ischemia of right foot.  He has rest pain and tissue loss.  Explained that we will proceed urgently with arteriography for further evaluation.  He does have extensive disease therefore stenting will in all likelihood not be an option.  Explained decision would be made at the time of his diagnostic arteriogram.  We will proceed with that study this week.  Explained that that in all likelihood he may require a right femoral to popliteal or tibial bypass.  He has had vein harvested from his right and left thigh for bypass.  Will proceed with vein map if bypass is indicated.  He understands that this is limb threatening without revascularization.   Rosetta Posner, MD FACS [facility] Tel [phone] Pager [phone]

## 2020-02-17 ENCOUNTER — Other Ambulatory Visit: Payer: Self-pay

## 2020-02-17 ENCOUNTER — Other Ambulatory Visit (HOSPITAL_COMMUNITY)
Admission: RE | Admit: 2020-02-17 | Discharge: 2020-02-17 | Disposition: A | Discharge status: Home / Self Care | Payer: Federal, State, Local not specified - PPO | Source: Ambulatory Visit | Attending: Vascular Surgery | Admitting: Vascular Surgery

## 2020-02-17 DIAGNOSIS — Z01812 Encounter for preprocedural laboratory examination: Secondary | ICD-10-CM | POA: Insufficient documentation

## 2020-02-17 DIAGNOSIS — Z20822 Contact with and (suspected) exposure to covid-19: Secondary | ICD-10-CM | POA: Diagnosis not present

## 2020-02-17 LAB — SARS CORONAVIRUS 2 (TAT 6-24 HRS): SARS Coronavirus 2: NEGATIVE

## 2020-02-18 ENCOUNTER — Ambulatory Visit (HOSPITAL_COMMUNITY)
Admission: RE | Admit: 2020-02-18 | Discharge: 2020-02-18 | Disposition: A | Discharge status: Home / Self Care | Payer: Federal, State, Local not specified - PPO | Source: Ambulatory Visit | Attending: Vascular Surgery | Admitting: Vascular Surgery

## 2020-02-18 ENCOUNTER — Other Ambulatory Visit: Payer: Self-pay

## 2020-02-18 ENCOUNTER — Encounter (HOSPITAL_COMMUNITY)
Admission: RE | Disposition: A | Discharge status: Home / Self Care | Payer: Self-pay | Source: Ambulatory Visit | Attending: Vascular Surgery

## 2020-02-18 ENCOUNTER — Ambulatory Visit (HOSPITAL_BASED_OUTPATIENT_CLINIC_OR_DEPARTMENT_OTHER): Payer: Federal, State, Local not specified - PPO

## 2020-02-18 DIAGNOSIS — I10 Essential (primary) hypertension: Secondary | ICD-10-CM | POA: Diagnosis not present

## 2020-02-18 DIAGNOSIS — Z794 Long term (current) use of insulin: Secondary | ICD-10-CM | POA: Insufficient documentation

## 2020-02-18 DIAGNOSIS — Z7982 Long term (current) use of aspirin: Secondary | ICD-10-CM | POA: Diagnosis not present

## 2020-02-18 DIAGNOSIS — Z888 Allergy status to other drugs, medicaments and biological substances status: Secondary | ICD-10-CM | POA: Insufficient documentation

## 2020-02-18 DIAGNOSIS — E1151 Type 2 diabetes mellitus with diabetic peripheral angiopathy without gangrene: Secondary | ICD-10-CM | POA: Diagnosis not present

## 2020-02-18 DIAGNOSIS — N186 End stage renal disease: Secondary | ICD-10-CM

## 2020-02-18 DIAGNOSIS — I2510 Atherosclerotic heart disease of native coronary artery without angina pectoris: Secondary | ICD-10-CM | POA: Diagnosis not present

## 2020-02-18 DIAGNOSIS — Z951 Presence of aortocoronary bypass graft: Secondary | ICD-10-CM | POA: Insufficient documentation

## 2020-02-18 DIAGNOSIS — K219 Gastro-esophageal reflux disease without esophagitis: Secondary | ICD-10-CM | POA: Diagnosis not present

## 2020-02-18 DIAGNOSIS — I252 Old myocardial infarction: Secondary | ICD-10-CM | POA: Insufficient documentation

## 2020-02-18 DIAGNOSIS — Z833 Family history of diabetes mellitus: Secondary | ICD-10-CM | POA: Insufficient documentation

## 2020-02-18 DIAGNOSIS — Z8249 Family history of ischemic heart disease and other diseases of the circulatory system: Secondary | ICD-10-CM | POA: Diagnosis not present

## 2020-02-18 DIAGNOSIS — I70221 Atherosclerosis of native arteries of extremities with rest pain, right leg: Secondary | ICD-10-CM | POA: Insufficient documentation

## 2020-02-18 DIAGNOSIS — Z886 Allergy status to analgesic agent status: Secondary | ICD-10-CM | POA: Insufficient documentation

## 2020-02-18 DIAGNOSIS — Z79899 Other long term (current) drug therapy: Secondary | ICD-10-CM | POA: Diagnosis not present

## 2020-02-18 DIAGNOSIS — Z955 Presence of coronary angioplasty implant and graft: Secondary | ICD-10-CM | POA: Insufficient documentation

## 2020-02-18 HISTORY — PX: ABDOMINAL AORTOGRAM W/LOWER EXTREMITY: CATH118223

## 2020-02-18 LAB — POCT I-STAT, CHEM 8
BUN: 32 mg/dL — ABNORMAL HIGH (ref 8–23)
Calcium, Ion: 1.27 mmol/L (ref 1.15–1.40)
Chloride: 102 mmol/L (ref 98–111)
Creatinine, Ser: 1.1 mg/dL (ref 0.61–1.24)
Glucose, Bld: 257 mg/dL — ABNORMAL HIGH (ref 70–99)
HCT: 35 % — ABNORMAL LOW (ref 39.0–52.0)
Hemoglobin: 11.9 g/dL — ABNORMAL LOW (ref 13.0–17.0)
Potassium: 3.8 mmol/L (ref 3.5–5.1)
Sodium: 139 mmol/L (ref 135–145)
TCO2: 27 mmol/L (ref 22–32)

## 2020-02-18 LAB — GLUCOSE, CAPILLARY: Glucose-Capillary: 187 mg/dL — ABNORMAL HIGH (ref 70–99)

## 2020-02-18 SURGERY — ABDOMINAL AORTOGRAM W/LOWER EXTREMITY
Anesthesia: LOCAL | Laterality: Bilateral

## 2020-02-18 MED ORDER — ONDANSETRON HCL 4 MG/2ML IJ SOLN: 4.0000 mg | Freq: Four times a day (QID) | INTRAMUSCULAR | Status: DC | PRN

## 2020-02-18 MED ORDER — SODIUM CHLORIDE 0.9 % IV SOLN: 250.0000 mL | INTRAVENOUS | Status: DC | PRN

## 2020-02-18 MED ORDER — SODIUM CHLORIDE 0.9 % IV SOLN: INTRAVENOUS | Status: DC

## 2020-02-18 MED ORDER — LIDOCAINE HCL (PF) 1 % IJ SOLN
INTRAMUSCULAR | Status: AC
  Filled 2020-02-18: qty 30

## 2020-02-18 MED ORDER — SODIUM CHLORIDE 0.9% FLUSH: 3.0000 mL | INTRAVENOUS | Status: DC | PRN

## 2020-02-18 MED ORDER — FENTANYL CITRATE (PF) 100 MCG/2ML IJ SOLN
INTRAMUSCULAR | Status: AC
  Filled 2020-02-18: qty 2

## 2020-02-18 MED ORDER — HYDRALAZINE HCL 20 MG/ML IJ SOLN: 5.0000 mg | INTRAMUSCULAR | Status: DC | PRN

## 2020-02-18 MED ORDER — SODIUM CHLORIDE 0.9 % WEIGHT BASED INFUSION: 1.0000 mL/kg/h | INTRAVENOUS | Status: DC

## 2020-02-18 MED ORDER — FENTANYL CITRATE (PF) 100 MCG/2ML IJ SOLN
INTRAMUSCULAR | Status: DC | PRN
  Administered 2020-02-18: 50 ug via INTRAVENOUS

## 2020-02-18 MED ORDER — SODIUM CHLORIDE 0.9% FLUSH: 3.0000 mL | Freq: Two times a day (BID) | INTRAVENOUS | Status: DC

## 2020-02-18 MED ORDER — HEPARIN (PORCINE) IN NACL 1000-0.9 UT/500ML-% IV SOLN
INTRAVENOUS | Status: AC
  Filled 2020-02-18: qty 500

## 2020-02-18 MED ORDER — HEPARIN (PORCINE) IN NACL 1000-0.9 UT/500ML-% IV SOLN
INTRAVENOUS | Status: DC | PRN
  Administered 2020-02-18 (×2): 500 mL

## 2020-02-18 MED ORDER — MIDAZOLAM HCL 2 MG/2ML IJ SOLN
INTRAMUSCULAR | Status: DC | PRN
  Administered 2020-02-18: 1 mg via INTRAVENOUS

## 2020-02-18 MED ORDER — IODIXANOL 320 MG/ML IV SOLN
INTRAVENOUS | Status: DC | PRN
  Administered 2020-02-18: 130 mL via INTRA_ARTERIAL

## 2020-02-18 MED ORDER — ACETAMINOPHEN 325 MG PO TABS: 650.0000 mg | ORAL_TABLET | ORAL | Status: DC | PRN

## 2020-02-18 MED ORDER — LABETALOL HCL 5 MG/ML IV SOLN: 10.0000 mg | INTRAVENOUS | Status: DC | PRN

## 2020-02-18 MED ORDER — LIDOCAINE HCL (PF) 1 % IJ SOLN
INTRAMUSCULAR | Status: DC | PRN
  Administered 2020-02-18: 20 mL via INTRADERMAL

## 2020-02-18 MED ORDER — CHLORHEXIDINE GLUCONATE CLOTH 2 % EX PADS: 6.0000 | MEDICATED_PAD | Freq: Every day | CUTANEOUS | Status: DC

## 2020-02-18 MED ORDER — SODIUM CHLORIDE 0.9% FLUSH: 10.0000 mL | INTRAVENOUS | Status: DC | PRN

## 2020-02-18 MED ORDER — HEPARIN SOD (PORK) LOCK FLUSH 100 UNIT/ML IV SOLN
INTRAVENOUS | Status: AC
  Administered 2020-02-18: 500 [IU]
  Filled 2020-02-18: qty 5

## 2020-02-18 MED ORDER — MIDAZOLAM HCL 2 MG/2ML IJ SOLN
INTRAMUSCULAR | Status: AC
  Filled 2020-02-18: qty 2

## 2020-02-18 SURGICAL SUPPLY — 11 items
CATH OMNI FLUSH 5F 65CM (CATHETERS) ×1 IMPLANT
KIT MICROPUNCTURE NIT STIFF (SHEATH) ×1 IMPLANT
KIT PV (KITS) ×2 IMPLANT
SHEATH PINNACLE 5F 10CM (SHEATH) ×1 IMPLANT
SHEATH PROBE COVER 6X72 (BAG) ×1 IMPLANT
STOPCOCK MORSE 400PSI 3WAY (MISCELLANEOUS) ×1 IMPLANT
SYR MEDRAD MARK 7 150ML (SYRINGE) ×2 IMPLANT
TRANSDUCER W/STOPCOCK (MISCELLANEOUS) ×2 IMPLANT
TRAY PV CATH (CUSTOM PROCEDURE TRAY) ×2 IMPLANT
TUBING CIL FLEX 10 FLL-RA (TUBING) ×1 IMPLANT
WIRE BENTSON .035X145CM (WIRE) ×1 IMPLANT

## 2020-02-18 NOTE — Op Note (Signed)
Patient name: Ian [name] MRN: [id number] DOB: [birth year] Sex: male  02/18/2020 Pre-operative Diagnosis: Critical limb ischemia of the right lower extremity with rest pain Post-operative diagnosis:  Same Surgeon:  Marty Heck, MD Procedure Performed: 1.  Ultrasound-guided access of the left common femoral artery 2.  Aortogram 3.  Right lower extremity arteriogram with selection of second-order branches 4.  Left lower extremity arteriogram with runoff from the left common femoral artery sheath 5.  33 minutes of monitored moderate conscious sedation time  Indications: Patient is a 64 year old male who was seen by Dr. Donnetta Hutching in consultation for nocturnal rest pain in the right lower extremity with history of bilateral lower extremity claudication.  Ultimately he presents today for planned lower extremity arteriogram and possible intervention after risk and benefits are discussed.  Findings:   Aortogram showed patent renal arteries bilaterally and there was no flow-limiting stenosis in the aortoiliac segment.  The bilateral common iliac arteries were calcified with some disease but this was not flow-limiting.  Right lower extremity shows a patent common femoral with only profunda runoff and a flush SFA occlusion.  He has a long segment SFA occlusion with reconstitution of above-knee popliteal artery and then a patent below-knee popliteal artery and single-vessel runoff via peroneal artery.  Through a collateral off the peroneal distally he does reconstitute the posterior tibial distally.  The anterior tibial is occluded throughout its course.  Left lower extremity shows a patent common femoral that is moderately diseased with only profunda runoff through a very diseased profunda.  He has a flush SFA occlusion on the left.  Long segment SFA occlusion with reconstitution of distal SFA/above knee popliteal artery and a patent below-knee popliteal artery with two-vessel runoff via  anterior tibial and peroneal artery.   Procedure:  The patient was identified in the holding area and taken to room 8.  The patient was then placed supine on the table and prepped and draped in the usual sterile fashion.  A time out was called.  Ultrasound was used to evaluate the left common femoral artery.  It was patent .  A digital ultrasound image was acquired.  A micropuncture needle was used to access the left common femoral artery under ultrasound guidance.  An 018 wire was advanced without resistance and a micropuncture sheath was placed.  The 018 wire was removed and a benson wire was placed.  The micropuncture sheath was exchanged for a 5 french sheath.  An omniflush catheter was advanced over the wire to the level of L-1.  An abdominal angiogram was obtained.  Next, using the omniflush catheter and a benson wire, the aortic bifurcation was crossed and the catheter was placed into theright external iliac artery and right runoff was obtained.   Next the wires and catheters were removed after right lower extremity runoff was obtained.  We then flushed the left common femoral sheath and injected contrast through the left groin sheath for left lower extremity runoff.  I did not want to place a closure device in the left common femoral artery given moderate disease.  Once we were done patient was taken to PACU to have his left groin sheath removed.  Plan: Patient will be vein mapped today for bilateral upper and lower extremity surface veins. He will be scheduled for a right common femoral to popliteal artery bypass with Dr. Donnetta Hutching next week.     Marty Heck, MD [facility] [place] Office: [phone]

## 2020-02-18 NOTE — Discharge Instructions (Signed)
Hold Metformin for 48 hours  Femoral Site Care This sheet gives you information about how to care for yourself after your procedure. Your health care provider may also give you more specific instructions. If you have problems or questions, contact your health care provider. What can I expect after the procedure?  After the procedure, it is common to have:  Bruising that usually fades within 1-2 weeks.  Tenderness at the site. Follow these instructions at home: Wound care 1. Follow instructions from your health care provider about how to take care of your insertion site. Make sure you: ? Wash your hands with soap and water before you change your bandage (dressing). If soap and water are not available, use hand sanitizer. ? Remove your dressing as told by your health care provider. 2. Do not take baths, swim, or use a hot tub until your health care provider approves. 3. You may shower 24-48 hours after the procedure or as told by your health care provider. ? Gently wash the site with plain soap and water. ? Pat the area dry with a clean towel. ? Do not rub the site. This may cause bleeding. 4. Do not apply powder or lotion to the site. Keep the site clean and dry. 5. Check your femoral site every day for signs of infection. Check for: ? Redness, swelling, or pain. ? Fluid or blood. ? Warmth. ? Pus or a bad smell. Activity 1. For the first 2-3 days after your procedure, or as long as directed: ? Avoid climbing stairs as much as possible. ? Do not squat. 2. Do not lift anything that is heavier than 10 lb (4.5 kg), or the limit that you are told, until your health care provider says that it is safe. For 5 days 3. Rest as directed. ? Avoid sitting for a long time without moving. Get up to take short walks every 1-2 hours. 4. Do not drive for 24 hours if you were given a medicine to help you relax (sedative). General instructions  Take over-the-counter and prescription medicines only as  told by your health care provider.  Keep all follow-up visits as told by your health care provider. This is important. Contact a health care provider if you have:  A fever or chills.  You have redness, swelling, or pain around your insertion site. Get help right away if:  The catheter insertion area swells very fast.  You pass out.  You suddenly start to sweat or your skin gets clammy.  The catheter insertion area is bleeding, and the bleeding does not stop when you hold steady pressure on the area.  The area near or just beyond the catheter insertion site becomes pale, cool, tingly, or numb. These symptoms may represent a serious problem that is an emergency. Do not wait to see if the symptoms will go away. Get medical help right away. Call your local emergency services (911 in the [place] Do not drive yourself to the hospital. Summary  After the procedure, it is common to have bruising that usually fades within 1-2 weeks.  Check your femoral site every day for signs of infection.  Do not lift anything that is heavier than 10 lb (4.5 kg), or the limit that you are told, until your health care provider says that it is safe. This information is not intended to replace advice given to you by your health care provider. Make sure you discuss any questions you have with your health care provider. Document Revised: 11/18/2017  Document Reviewed: 11/18/2017 Elsevier Patient Education  [employer].

## 2020-02-18 NOTE — Progress Notes (Signed)
Up and walked and tolerated well; left groin stable, no bleeding or hematoma 

## 2020-02-18 NOTE — Progress Notes (Signed)
Upper and lower extremity vein mapping has been completed.   Preliminary results in CV Proc.   Abram Sander 02/18/2020 3:29 PM

## 2020-02-18 NOTE — Progress Notes (Signed)
5 French arterial sheath removed from left groin. Groin site level 0 pre and post sheath pull. Manual pressure held for 20 minutes. Clean dry dressing applied. Care instructions given to patient. Vital signs remain stable. Bedrest begins 1420 for four hours.

## 2020-02-18 NOTE — H&P (Signed)
History and Physical Interval Note:  02/18/2020 12:26 PM  Ian [name]  has presented today for surgery, with the diagnosis of PAD.  The various methods of treatment have been discussed with the patient and family. After consideration of risks, benefits and other options for treatment, the patient has consented to  Procedure(s): ABDOMINAL AORTOGRAM W/LOWER EXTREMITY (Bilateral) as a surgical intervention.  The patient's history has been reviewed, patient examined, no change in status, stable for surgery.  I have reviewed the patient's chart and labs.  Questions were answered to the patient's satisfaction.     Marty Heck  Vascular and Vein Specialist of [facility]  Patient name: Ian [name] MRN: [id number] DOB: 10/28/1956 Sex: male  REASON FOR CONSULT: Evaluation critical limb ischemia right foot  HPI:  Ian [name] is a 64 y.o. male, who is here today for evaluation. He has a very complicated past history. Long history of diabetes. He has never smoked. He underwent coronary artery bypass grafting in 2008 for early onset cardiac disease. Is a history of colon cancer with metastasis to the liver which is been treated with chemotherapy and is in remission. He has had a 69-month history of progressive discomfort in his right foot. He has classic nocturnal arterial rest pain. He has bilateral calf claudication symptoms. Over the past several weeks he is developed more ruborous changes and has chronic dry gangrenous changes over the tip of his right great toe and also fourth toe tip      Past Medical History:  Diagnosis Date  . Caffeine dependence ([place]) 01/10/2013  . Colon cancer ([place])   . Complication of anesthesia    atypical pseudo-cholinesterase deficiency  . Coronary artery disease involving native coronary artery of native heart with unstable angina pectoris ([place]) 01/10/2013   A) Mv CAD- CABGx 4 (LIMA-LAD, SVG-rPDA, SVG-OM1, SVG-OM2z);09/14/19: NSTEMI - Cath: p-mRCA, Ost-prox  LAD, OM3 & SVG-OM1, SVG-OM2 100% CTO; Patentl LIMA-LAD. m-dLM 95% - p-mCx 90%, m-d Cx 90%. 2 Site DES PCI: LRad-RPDA - prox 95% (Resolute Onyx DES 2.75 x 15) , m-d 99% (Resolute Onyx 2.75 x 22); c) 09/16/19: Orbital Atherectomy LM-p-m LCx - Overlapping DES 2.5 x 34 ( LCx) -> 3 x 30 (LM-LCx)  . Diabetes mellitus without complication ([place])   . Family history of adverse reaction to anesthesia    sister also has atypical pseudo-cholinesterase deficiency  . GERD (gastroesophageal reflux disease)   . History of heart bypass surgery   . Hypertension         Family History  Problem Relation Age of Onset  . Hypertension Father   . Diabetes Father   . Heart disease Father   . Diabetes Sister   . Hypertension Sister   . Hypertension Brother   . Diabetes Brother   . Diabetes Sister   . Diabetes Brother   . Heart disease Brother   . Lymphoma Brother   . Hypertension Son    SOCIAL HISTORY:  Social History        Socioeconomic History  . Marital status: Married    Spouse name: Not on file  . Number of children: 3  . Years of education: Not on file  . Highest education level: Not on file  Occupational History  . Occupation: Programmer, systems: [employer]  Tobacco Use  . Smoking status: Never Smoker  . Smokeless tobacco: Never Used  Substance and Sexual Activity  . Alcohol use: No  . Drug use: No  .  Sexual activity: Yes    Birth control/protection: None  Other Topics Concern  . Not on file  Social History Narrative  . Not on file   Social Determinants of Health      Financial Resource Strain:   . Difficulty of Paying Living Expenses:   Food Insecurity:   . Worried About Charity fundraiser in the Last Year:   . Arboriculturist in the Last Year:   Transportation Needs:   . Film/video editor (Medical):   Marland Kitchen Lack of Transportation (Non-Medical):   Physical Activity:   . Days of Exercise per Week:   . Minutes of Exercise per Session:   Stress:   .  Feeling of Stress :   Social Connections:   . Frequency of Communication with Friends and Family:   . Frequency of Social Gatherings with Friends and Family:   . Attends Religious Services:   . Active Member of Clubs or Organizations:   . Attends Archivist Meetings:   Marland Kitchen Marital Status:   Intimate Partner Violence:   . Fear of Current or Ex-Partner:   . Emotionally Abused:   Marland Kitchen Physically Abused:   . Sexually Abused:         Allergies  Allergen Reactions  . Anectine [Succinylcholine] Other (See Comments)    Atypical pseudocholinesterase deficiency.   . Nsaids Other (See Comments)    Contraindicated  (can only have asa 81mg )         Current Outpatient Medications  Medication Sig Dispense Refill  . amLODipine (NORVASC) 10 MG tablet TAKE ONE TABLET BY MOUTH ONCE DAILY. 30 tablet 0  . aspirin EC 81 MG tablet Take 81 mg by mouth daily.    . Coenzyme Q10 (COQ10) 100 MG CAPS Take 100 mg by mouth daily.    . diphenhydramine-acetaminophen (TYLENOL PM) 25-500 MG TABS tablet Take 2 tablets by mouth at bedtime.    Marland Kitchen ezetimibe (ZETIA) 10 MG tablet Take 1 tablet (10 mg total) by mouth daily. 30 tablet 1  . FARXIGA 10 MG TABS tablet Take 10 mg by mouth daily.     Marland Kitchen gabapentin (NEURONTIN) 300 MG capsule Take 300 mg by mouth at bedtime.    . insulin aspart protamine- aspart (NOVOLOG MIX 70/30) (70-30) 100 UNIT/ML injection Inject 20 Units into the skin 3 (three) times daily with meals.    . Insulin Glargine (BASAGLAR KWIKPEN) 100 UNIT/ML SOPN Inject 40 Units into the skin at bedtime.     . isosorbide mononitrate (IMDUR) 60 MG 24 hr tablet Take 1 tablet (60 mg total) by mouth daily. 90 tablet 3  . JANUVIA 100 MG tablet Take 100 mg by mouth daily.     . metFORMIN (GLUCOPHAGE) 500 MG tablet Take 500 mg by mouth 2 (two) times daily.    . metoprolol succinate (TOPROL-XL) 100 MG 24 hr tablet Take 1 tablet (100 mg total) by mouth 2 (two) times daily. Take with or immediately following a meal.  60 tablet 1  . nitroGLYCERIN (NITROSTAT) 0.4 MG SL tablet Place 1 tablet (0.4 mg total) under the tongue every 5 (five) minutes x 3 doses as needed for chest pain. 75 tablet 3  . Omega-3 Fatty Acids (FISH OIL PO) Take 1 capsule by mouth daily.    . pantoprazole (PROTONIX) 40 MG tablet Take 40 mg by mouth daily.    . prasugrel (EFFIENT) 10 MG TABS tablet Take 1 tablet (10 mg total) by mouth daily. 90 tablet 2  .  ramipril (ALTACE) 10 MG capsule Take 10 mg by mouth daily.    . rosuvastatin (CRESTOR) 40 MG tablet Take 1 tablet (40 mg total) by mouth at bedtime. 90 tablet 0  . hydrochlorothiazide (HYDRODIURIL) 25 MG tablet Take 1 tablet (25 mg total) by mouth daily. 90 tablet 3   No current facility-administered medications for this visit.   REVIEW OF SYSTEMS:  [X]  denotes positive finding, [ ]  denotes negative finding  Cardiac  Comments:  Chest pain or chest pressure:    Shortness of breath upon exertion:    Short of breath when lying flat:    Irregular heart rhythm:        Vascular    Pain in calf, thigh, or hip brought on by ambulation: x   Pain in feet at night that wakes you up from your sleep:  x   Blood clot in your veins:    Leg swelling:         Pulmonary    Oxygen at home:    Productive cough:     Wheezing:         Neurologic    Sudden weakness in arms or legs:     Sudden numbness in arms or legs:     Sudden onset of difficulty speaking or slurred speech:    Temporary loss of vision in one eye:     Problems with dizziness:         Gastrointestinal    Blood in stool:     Vomited blood:         Genitourinary    Burning when urinating:     Blood in urine:        Psychiatric    Major depression:         Hematologic    Bleeding problems:    Problems with blood clotting too easily:        Skin    Rashes or ulcers:        Constitutional    Fever or chills:    PHYSICAL EXAM:     Vitals:   02/16/20 1023  BP: 117/67  Pulse: 85  Resp: 18  Temp: 98.1 F (36.7  C)  TempSrc: Temporal  SpO2: 99%  Weight: 189 lb 9.6 oz (86 kg)  Height: 5\' 4"  (1.626 m)   GENERAL: The patient is a well-nourished male, in no acute distress. The vital signs are documented above.  CARDIOVASCULAR: 2+ radial pulses bilaterally. Palpable femoral pulses bilaterally. Absent popliteal and distal pulses  PULMONARY: There is good air exchange  ABDOMEN: Soft and non-tender  MUSCULOSKELETAL: There are no major deformities or cyanosis.  NEUROLOGIC: No focal weakness or paresthesias are detected.  SKIN: Dependent rubor throughout his right foot. He does have a dry gangrene on the tip of his right great toe and fourth toe.  PSYCHIATRIC: The patient has a normal affect.  DATA:  Noninvasive studies on February of this year were reviewed. This reveals evidence of SFA and tibial occlusive disease bilaterally.  Noninvasive studies in our office today revealed ankle arm index of 0.4 -0.5 bilaterally  MEDICAL ISSUES:  Critical limb ischemia of right foot. He has rest pain and tissue loss. Explained that we will proceed urgently with arteriography for further evaluation. He does have extensive disease therefore stenting will in all likelihood not be an option. Explained decision would be made at the time of his diagnostic arteriogram. We will proceed with that study this week. Explained that that in all  likelihood he may require a right femoral to popliteal or tibial bypass. He has had vein harvested from his right and left thigh for bypass. Will proceed with vein map if bypass is indicated. He understands that this is limb threatening without revascularization.  Rosetta Posner, MD FACS  [facility] Tel [phone]  Pager [phone]

## 2020-02-18 NOTE — Progress Notes (Signed)
Discharge instructions given to wife Ian [name] over the phone and to patient. Verbalized understanding.

## 2020-02-19 ENCOUNTER — Inpatient Hospital Stay (HOSPITAL_COMMUNITY): Payer: Federal, State, Local not specified - PPO | Admitting: Physician Assistant

## 2020-02-19 ENCOUNTER — Other Ambulatory Visit: Payer: Self-pay

## 2020-02-19 NOTE — Progress Notes (Addendum)
Anesthesia Chart Review: Same day workup  History of metastatic colon CA s/p sigmoidectomy with hepatic wedge resection, adjuvant chemotherapy completed 03/10/19 (FOLFIRI + Avastin). Followed by oncologist Dr. Delton Coombes. Last seen 05/07/19, doing well at that time.  Follows with cardiology for extensive CAD history with recent STEMI Feb 2021 treated with DES. Last seen by Richardson Dopp, PA-C 02/09/20. Per note, "History of CABG in 2008.  He suffered a myocardial infarction in October 2020 treated with a drug-eluting stent x2 to the radial graft to the PDA.  He underwent staged PCI with a drug-eluting stent x2 to a protected left main into the LCx.  Recently, he had an inferolateral ST elevation myocardial infarction February 2021 treated with a drug-eluting stent to the radial graft to the PDA secondary to in-stent restenosis.  EF is 45-50%.  He is currently doing well without recurrent anginal symptoms. Continue amlodipine, aspirin, ezetimibe, isosorbide, metoprolol succinate, prasugrel, Ramipril, rosuvastatin...PAD (peripheral artery disease) ([place]): He has a necrotic ulcer on his right great toe.  He has significant peripheral arterial disease on lower extremity arterial Dopplers and has an appointment next week with vascular surgery."  IDDMII, last A1c 9.8 on 01/04/20.  History of pseudo-cholinesterase deficiency. Discovered in 2016 when he had prolonged postop paralysis. Per discharge summary, "underwent an umbilical herniorrhaphy with mesh on 02/07/2015. At the end of the procedure, the patient could not be extubated due to ongoing paralysis secondary to succinylcholine. He was transferred intubated to the ICU until his chemical paralysis had resolved. In discussion with the family after the fact, he had a sister who had a similar episode after undergoing general anesthesia. Is suspected he has a pseudocholinesterase deficiency. After approximately 3-4 hours, his paralysis resolved and he was able to be  extubated."  Will need DOS labs and eval.   EKG 02/09/20 (read per OV note same date): normal sinus rhythm, heart rate 92, normal axis, T wave inversions 1, aVL, QTC 472, septal Q waves  Echocardiogram 01/04/2020 EF 45-50, mild LVH, Gr 2 DD, normal RVSF, mild to mod LAE, mild MR, mild AS (mean gradient 6 mmHg)   Cardiac catheterization 01/04/2020 LM mid stent patent LAD ost 100 LCx stent patent with ost 40 ISR; OM3 100 w/ L-L collats RCA prox 100 L Radial-RPDA ost 95, prox 75, mid stent patent w/ 30 ISR S-OM2 100 S-OM1 100 L-LAD patent PCI:  3 x 38 mm Resolute Onyx DES to [place], PA-C [facility] Short Stay Center/Anesthesiology Phone [phone] 02/19/2020 3:10 PM

## 2020-02-19 NOTE — Anesthesia Preprocedure Evaluation (Addendum)
Anesthesia Evaluation  Patient identified by MRN, date of birth, ID band Patient awake    Reviewed: Allergy & Precautions, NPO status   History of Anesthesia Complications (+) PSEUDOCHOLINESTERASE DEFICIENCY, Family history of anesthesia reaction and history of anesthetic complications  Airway Mallampati: II  TM Distance: >3 FB     Dental   Pulmonary sleep apnea ,    breath sounds clear to auscultation       Cardiovascular hypertension, + angina + CAD and + Past MI   Rhythm:Regular Rate:Normal     Neuro/Psych    GI/Hepatic Neg liver ROS, GERD  ,  Endo/Other  diabetes  Renal/GU negative Renal ROS     Musculoskeletal   Abdominal   Peds  Hematology   Anesthesia Other Findings   Reproductive/Obstetrics                           Anesthesia Physical Anesthesia Plan  ASA: III  Anesthesia Plan: General   Post-op Pain Management:    Induction: Intravenous  PONV Risk Score and Plan: 2 and Ondansetron, Dexamethasone and Midazolam  Airway Management Planned: Oral ETT  Additional Equipment:   Intra-op Plan:   Post-operative Plan:   Informed Consent: I have reviewed the patients History and Physical, chart, labs and discussed the procedure including the risks, benefits and alternatives for the proposed anesthesia with the patient or authorized representative who has indicated his/her understanding and acceptance.     Dental advisory given  Plan Discussed with: CRNA and Anesthesiologist  Anesthesia Plan Comments: (History of metastatic colon CA s/p sigmoidectomy with hepatic wedge resection, adjuvant chemotherapy completed 03/10/19 (FOLFIRI + Avastin). Followed by oncologist Dr. Delton Coombes. Last seen 05/07/19, doing well at that time.  Follows with cardiology for extensive CAD history with recent STEMI Feb 2021 treated with DES. Last seen by Richardson Dopp, PA-C 02/09/20. Per note, "History of  CABG in 2008.  He suffered a myocardial infarction in October 2020 treated with a drug-eluting stent x2 to the radial graft to the PDA.  He underwent staged PCI with a drug-eluting stent x2 to a protected left main into the LCx.  Recently, he had an inferolateral ST elevation myocardial infarction February 2021 treated with a drug-eluting stent to the radial graft to the PDA secondary to in-stent restenosis.  EF is 45-50%.  He is currently doing well without recurrent anginal symptoms. Continue amlodipine, aspirin, ezetimibe, isosorbide, metoprolol succinate, prasugrel, Ramipril, rosuvastatin...PAD (peripheral artery disease) ([place]): He has a necrotic ulcer on his right great toe.  He has significant peripheral arterial disease on lower extremity arterial Dopplers and has an appointment next week with vascular surgery."  IDDMII, last A1c 9.8 on 01/04/20.  History of pseudo-cholinesterase deficiency. Discovered in 2016 when he had prolonged postop paralysis. Per discharge summary, "underwent an umbilical herniorrhaphy with mesh on 02/07/2015. At the end of the procedure, the patient could not be extubated due to ongoing paralysis secondary to succinylcholine. He was transferred intubated to the ICU until his chemical paralysis had resolved. In discussion with the family after the fact, he had a sister who had a similar episode after undergoing general anesthesia. Is suspected he has a pseudocholinesterase deficiency. After approximately 3-4 hours, his paralysis resolved and he was able to be extubated."  Will need DOS labs and eval.   EKG 02/09/20 (read per OV note same date): normal sinus rhythm, heart rate 92, normal axis, T wave inversions 1, aVL, QTC 472, septal Q waves  Echocardiogram 01/04/2020 EF 45-50, mild LVH, Gr 2 DD, normal RVSF, mild to mod LAE, mild MR, mild AS (mean gradient 6 mmHg)   Cath and PCI 01/04/2020 LM mid stent patent LAD ost 100 LCx stent patent with ost 40 ISR; OM3 100 w/ L-L  collats RCA prox 100 L Radial-RPDA ost 95, prox 75, mid stent patent w/ 30 ISR S-OM2 100 S-OM1 100 L-LAD patent PCI:  3 x 38 mm Resolute Onyx DES to L Radial-PDA )     Anesthesia Quick Evaluation

## 2020-02-19 NOTE — Progress Notes (Signed)
SDW Pre-op call completed by pt spouse [name] (DPR). Spouse denies that pt C/O SOB and chest pain. Spouse stated that pt is under the care of Dr. Sherren Mocha, Cardiology. Spouse stated that pt had a stress test > 10 years ago. Spouse made aware to have pt stop taking vitamins, fish oil, CO Q 10 and herbal medications. Do not take any NSAIDs ie: Ibuprofen, Advil, Naproxen (Aleve), Motrin, BC and Goody Powder. Spouse made aware to have pt hold Farxiga the day before surgery, take 70% (14 units) of Novolog 70/30 at dinner and 50% ( 20 units) of Basaglar insuin at HS the night before surgery. Take no diabetes medication (Insulin, Metformin, Januvia) on DOS. Spouse made aware to have pt check CBG every 2 hours prior to arrival to hospital on DOS. Spouse made aware to treat a CBG < 70 with 4 ounces of apple or cranberry juice, wait 15 minutes after intervention to recheck CBG, if CBG remains < 70, call Short Stay unit to speak with a nurse. Dr. Donnetta Hutching confirmed that pt is to take Effient on DOS. Nurse instructed pt spouse, [name] (DPR), to have pt take Effient on the morning of surgery. Spouse made aware to have pt continue to quarantine. Spouse verbalized understanding of all pre-op instructions.

## 2020-02-20 ENCOUNTER — Other Ambulatory Visit (HOSPITAL_COMMUNITY)
Admit: 2020-02-20 | Discharge: 2020-02-20 | Disposition: A | Discharge status: Home / Self Care | Payer: Federal, State, Local not specified - PPO | Source: Ambulatory Visit | Attending: Vascular Surgery | Admitting: Vascular Surgery

## 2020-02-20 DIAGNOSIS — Z20822 Contact with and (suspected) exposure to covid-19: Secondary | ICD-10-CM | POA: Diagnosis not present

## 2020-02-20 DIAGNOSIS — Z01812 Encounter for preprocedural laboratory examination: Secondary | ICD-10-CM | POA: Diagnosis not present

## 2020-02-20 LAB — SARS CORONAVIRUS 2 (TAT 6-24 HRS): SARS Coronavirus 2: NEGATIVE

## 2020-02-22 ENCOUNTER — Other Ambulatory Visit: Payer: Self-pay

## 2020-02-22 ENCOUNTER — Encounter (HOSPITAL_COMMUNITY): Payer: Self-pay | Admitting: Vascular Surgery

## 2020-02-22 ENCOUNTER — Encounter (HOSPITAL_COMMUNITY)
Admission: RE | Disposition: A | Discharge status: Home / Self Care | Payer: Self-pay | Source: Home / Self Care | Attending: Cardiovascular Disease

## 2020-02-22 ENCOUNTER — Inpatient Hospital Stay (HOSPITAL_COMMUNITY)
Admission: RE | Admit: 2020-02-22 | Discharge: 2020-02-23 | DRG: 313 | Disposition: A | Discharge status: Home / Self Care | Payer: Federal, State, Local not specified - PPO | Attending: Cardiovascular Disease | Admitting: Cardiovascular Disease

## 2020-02-22 DIAGNOSIS — Z9049 Acquired absence of other specified parts of digestive tract: Secondary | ICD-10-CM

## 2020-02-22 DIAGNOSIS — R079 Chest pain, unspecified: Secondary | ICD-10-CM | POA: Diagnosis present

## 2020-02-22 DIAGNOSIS — Z79899 Other long term (current) drug therapy: Secondary | ICD-10-CM

## 2020-02-22 DIAGNOSIS — E785 Hyperlipidemia, unspecified: Secondary | ICD-10-CM | POA: Diagnosis present

## 2020-02-22 DIAGNOSIS — I252 Old myocardial infarction: Secondary | ICD-10-CM | POA: Diagnosis not present

## 2020-02-22 DIAGNOSIS — I10 Essential (primary) hypertension: Secondary | ICD-10-CM | POA: Diagnosis not present

## 2020-02-22 DIAGNOSIS — E1165 Type 2 diabetes mellitus with hyperglycemia: Secondary | ICD-10-CM | POA: Diagnosis present

## 2020-02-22 DIAGNOSIS — Z85038 Personal history of other malignant neoplasm of large intestine: Secondary | ICD-10-CM

## 2020-02-22 DIAGNOSIS — Z955 Presence of coronary angioplasty implant and graft: Secondary | ICD-10-CM

## 2020-02-22 DIAGNOSIS — Z794 Long term (current) use of insulin: Secondary | ICD-10-CM

## 2020-02-22 DIAGNOSIS — Z951 Presence of aortocoronary bypass graft: Secondary | ICD-10-CM

## 2020-02-22 DIAGNOSIS — I70261 Atherosclerosis of native arteries of extremities with gangrene, right leg: Secondary | ICD-10-CM | POA: Diagnosis not present

## 2020-02-22 DIAGNOSIS — F419 Anxiety disorder, unspecified: Secondary | ICD-10-CM | POA: Diagnosis not present

## 2020-02-22 DIAGNOSIS — Z9221 Personal history of antineoplastic chemotherapy: Secondary | ICD-10-CM

## 2020-02-22 DIAGNOSIS — Z8249 Family history of ischemic heart disease and other diseases of the circulatory system: Secondary | ICD-10-CM

## 2020-02-22 DIAGNOSIS — I255 Ischemic cardiomyopathy: Secondary | ICD-10-CM | POA: Diagnosis present

## 2020-02-22 DIAGNOSIS — E1169 Type 2 diabetes mellitus with other specified complication: Secondary | ICD-10-CM | POA: Diagnosis not present

## 2020-02-22 DIAGNOSIS — Z8505 Personal history of malignant neoplasm of liver: Secondary | ICD-10-CM | POA: Diagnosis not present

## 2020-02-22 DIAGNOSIS — R072 Precordial pain: Secondary | ICD-10-CM | POA: Diagnosis not present

## 2020-02-22 DIAGNOSIS — E1142 Type 2 diabetes mellitus with diabetic polyneuropathy: Secondary | ICD-10-CM | POA: Diagnosis present

## 2020-02-22 DIAGNOSIS — I96 Gangrene, not elsewhere classified: Secondary | ICD-10-CM | POA: Diagnosis not present

## 2020-02-22 DIAGNOSIS — T447X5A Adverse effect of beta-adrenoreceptor antagonists, initial encounter: Secondary | ICD-10-CM | POA: Diagnosis present

## 2020-02-22 DIAGNOSIS — Z7982 Long term (current) use of aspirin: Secondary | ICD-10-CM | POA: Diagnosis not present

## 2020-02-22 DIAGNOSIS — E1152 Type 2 diabetes mellitus with diabetic peripheral angiopathy with gangrene: Secondary | ICD-10-CM | POA: Diagnosis present

## 2020-02-22 DIAGNOSIS — I25110 Atherosclerotic heart disease of native coronary artery with unstable angina pectoris: Secondary | ICD-10-CM

## 2020-02-22 DIAGNOSIS — Z888 Allergy status to other drugs, medicaments and biological substances status: Secondary | ICD-10-CM

## 2020-02-22 DIAGNOSIS — K219 Gastro-esophageal reflux disease without esophagitis: Secondary | ICD-10-CM | POA: Diagnosis present

## 2020-02-22 DIAGNOSIS — IMO0002 Reserved for concepts with insufficient information to code with codable children: Secondary | ICD-10-CM | POA: Diagnosis present

## 2020-02-22 DIAGNOSIS — Z833 Family history of diabetes mellitus: Secondary | ICD-10-CM

## 2020-02-22 HISTORY — DX: Acute myocardial infarction, unspecified: I21.9

## 2020-02-22 LAB — URINALYSIS, ROUTINE W REFLEX MICROSCOPIC
Bacteria, UA: NONE SEEN
Bilirubin Urine: NEGATIVE
Glucose, UA: >=500 mg/dL — AB
Hgb urine dipstick: NEGATIVE
Ketones, ur: NEGATIVE mg/dL
Leukocytes,Ua: NEGATIVE
Nitrite: NEGATIVE
Protein, ur: 30 mg/dL — AB
Specific Gravity, Urine: 1.02 (ref 1.005–1.030)
pH: 5 (ref 5.0–8.0)

## 2020-02-22 LAB — CBC
HCT: 39.6 % (ref 39.0–52.0)
HCT: 36.7 % — ABNORMAL LOW (ref 39.0–52.0)
Hemoglobin: 13.2 g/dL (ref 13.0–17.0)
Hemoglobin: 12.1 g/dL — ABNORMAL LOW (ref 13.0–17.0)
MCH: 28.6 pg (ref 26.0–34.0)
MCH: 27.9 pg (ref 26.0–34.0)
MCHC: 33.3 g/dL (ref 30.0–36.0)
MCHC: 33 g/dL (ref 30.0–36.0)
MCV: 85.9 fL (ref 80.0–100.0)
MCV: 84.8 fL (ref 80.0–100.0)
Platelets: 262 10*3/uL (ref 150–400)
Platelets: 254 10*3/uL (ref 150–400)
RBC: 4.61 MIL/uL (ref 4.22–5.81)
RBC: 4.33 MIL/uL (ref 4.22–5.81)
RDW: 13.4 % (ref 11.5–15.5)
RDW: 13.5 % (ref 11.5–15.5)
WBC: 11.2 10*3/uL — ABNORMAL HIGH (ref 4.0–10.5)
WBC: 9 10*3/uL (ref 4.0–10.5)
nRBC: 0 % (ref 0.0–0.2)
nRBC: 0 % (ref 0.0–0.2)

## 2020-02-22 LAB — TROPONIN I (HIGH SENSITIVITY)
Troponin I (High Sensitivity): 61 ng/L — ABNORMAL HIGH (ref <18)
Troponin I (High Sensitivity): 63 ng/L — ABNORMAL HIGH (ref <18)

## 2020-02-22 LAB — GLUCOSE, CAPILLARY
Glucose-Capillary: 366 mg/dL — ABNORMAL HIGH (ref 70–99)
Glucose-Capillary: 364 mg/dL — ABNORMAL HIGH (ref 70–99)
Glucose-Capillary: 388 mg/dL — ABNORMAL HIGH (ref 70–99)
Glucose-Capillary: 389 mg/dL — ABNORMAL HIGH (ref 70–99)
Glucose-Capillary: 329 mg/dL — ABNORMAL HIGH (ref 70–99)
Glucose-Capillary: 398 mg/dL — ABNORMAL HIGH (ref 70–99)
Glucose-Capillary: 314 mg/dL — ABNORMAL HIGH (ref 70–99)
Glucose-Capillary: 328 mg/dL — ABNORMAL HIGH (ref 70–99)
Glucose-Capillary: 335 mg/dL — ABNORMAL HIGH (ref 70–99)

## 2020-02-22 LAB — SURGICAL PCR SCREEN
MRSA, PCR: NEGATIVE
Staphylococcus aureus: NEGATIVE

## 2020-02-22 LAB — COMPREHENSIVE METABOLIC PANEL
ALT: 29 U/L (ref 0–44)
AST: 20 U/L (ref 15–41)
Albumin: 3.8 g/dL (ref 3.5–5.0)
Alkaline Phosphatase: 86 U/L (ref 38–126)
Anion gap: 18 — ABNORMAL HIGH (ref 5–15)
BUN: 39 mg/dL — ABNORMAL HIGH (ref 8–23)
CO2: 22 mmol/L (ref 22–32)
Calcium: 9.7 mg/dL (ref 8.9–10.3)
Chloride: 97 mmol/L — ABNORMAL LOW (ref 98–111)
Creatinine, Ser: 1.34 mg/dL — ABNORMAL HIGH (ref 0.61–1.24)
GFR calc Af Amer: >60 mL/min (ref >60)
GFR calc non Af Amer: 56 mL/min — ABNORMAL LOW (ref >60)
Glucose, Bld: 389 mg/dL — ABNORMAL HIGH (ref 70–99)
Potassium: 3.4 mmol/L — ABNORMAL LOW (ref 3.5–5.1)
Sodium: 137 mmol/L (ref 135–145)
Total Bilirubin: 0.4 mg/dL (ref 0.3–1.2)
Total Protein: 7.1 g/dL (ref 6.5–8.1)

## 2020-02-22 LAB — CREATININE, SERUM
Creatinine, Ser: 1.14 mg/dL (ref 0.61–1.24)
GFR calc Af Amer: >60 mL/min (ref >60)
GFR calc non Af Amer: >60 mL/min (ref >60)

## 2020-02-22 LAB — PROTIME-INR
INR: 1 (ref 0.8–1.2)
Prothrombin Time: 12.9 seconds (ref 11.4–15.2)

## 2020-02-22 LAB — TYPE AND SCREEN
ABO/RH(D): O NEG
Antibody Screen: NEGATIVE

## 2020-02-22 LAB — APTT: aPTT: 28 seconds (ref 24–36)

## 2020-02-22 SURGERY — BYPASS GRAFT FEMORAL-POPLITEAL ARTERY
Anesthesia: General | Laterality: Right

## 2020-02-22 MED ORDER — METOPROLOL SUCCINATE ER 100 MG PO TB24
100.0000 mg | ORAL_TABLET | Freq: Two times a day (BID) | ORAL | Status: DC
  Administered 2020-02-22 – 2020-02-23 (×3): 100 mg via ORAL
  Filled 2020-02-22 (×3): qty 1

## 2020-02-22 MED ORDER — MUPIROCIN 2 % EX OINT: 1.0000 "application " | TOPICAL_OINTMENT | Freq: Once | CUTANEOUS | Status: DC

## 2020-02-22 MED ORDER — ISOSORBIDE MONONITRATE ER 60 MG PO TB24
60.0000 mg | ORAL_TABLET | Freq: Every day | ORAL | Status: DC
  Administered 2020-02-23: 60 mg via ORAL
  Filled 2020-02-22: qty 1

## 2020-02-22 MED ORDER — PROPOFOL 10 MG/ML IV BOLUS
INTRAVENOUS | Status: AC
  Filled 2020-02-22: qty 20

## 2020-02-22 MED ORDER — INSULIN ASPART 100 UNIT/ML ~~LOC~~ SOLN
0.0000 [IU] | Freq: Three times a day (TID) | SUBCUTANEOUS | Status: DC
  Administered 2020-02-22 – 2020-02-23 (×2): 8 [IU] via SUBCUTANEOUS

## 2020-02-22 MED ORDER — FENTANYL CITRATE (PF) 250 MCG/5ML IJ SOLN
INTRAMUSCULAR | Status: AC
  Filled 2020-02-22: qty 5

## 2020-02-22 MED ORDER — INSULIN ASPART 100 UNIT/ML ~~LOC~~ SOLN
4.0000 [IU] | Freq: Three times a day (TID) | SUBCUTANEOUS | Status: DC
  Administered 2020-02-22 – 2020-02-23 (×2): 4 [IU] via SUBCUTANEOUS

## 2020-02-22 MED ORDER — ASPIRIN 81 MG PO CHEW: 324.0000 mg | CHEWABLE_TABLET | ORAL | Status: DC

## 2020-02-22 MED ORDER — ONDANSETRON HCL 4 MG/2ML IJ SOLN: 4.0000 mg | Freq: Four times a day (QID) | INTRAMUSCULAR | Status: DC | PRN

## 2020-02-22 MED ORDER — METOPROLOL TARTRATE 5 MG/5ML IV SOLN: 5.0000 mg | Freq: Once | INTRAVENOUS | Status: AC

## 2020-02-22 MED ORDER — PRASUGREL HCL 10 MG PO TABS
10.0000 mg | ORAL_TABLET | Freq: Every day | ORAL | Status: DC
  Administered 2020-02-23: 10 mg via ORAL
  Filled 2020-02-22: qty 1

## 2020-02-22 MED ORDER — INSULIN DETEMIR 100 UNIT/ML ~~LOC~~ SOLN
25.0000 [IU] | Freq: Two times a day (BID) | SUBCUTANEOUS | Status: DC
  Administered 2020-02-22 – 2020-02-23 (×2): 25 [IU] via SUBCUTANEOUS
  Filled 2020-02-22 (×4): qty 0.25

## 2020-02-22 MED ORDER — ASPIRIN 300 MG RE SUPP: 300.0000 mg | RECTAL | Status: DC

## 2020-02-22 MED ORDER — ACETAMINOPHEN 325 MG PO TABS: 650.0000 mg | ORAL_TABLET | ORAL | Status: DC | PRN

## 2020-02-22 MED ORDER — INSULIN GLARGINE 100 UNIT/ML ~~LOC~~ SOLN
40.0000 [IU] | Freq: Every day | SUBCUTANEOUS | Status: DC
  Filled 2020-02-22: qty 0.4

## 2020-02-22 MED ORDER — CHLORHEXIDINE GLUCONATE CLOTH 2 % EX PADS: 6.0000 | MEDICATED_PAD | Freq: Once | CUTANEOUS | Status: DC

## 2020-02-22 MED ORDER — AMLODIPINE BESYLATE 10 MG PO TABS
10.0000 mg | ORAL_TABLET | Freq: Every day | ORAL | Status: DC
  Administered 2020-02-22 – 2020-02-23 (×2): 10 mg via ORAL
  Filled 2020-02-22 (×2): qty 1

## 2020-02-22 MED ORDER — SODIUM CHLORIDE 0.9 % IV SOLN: INTRAVENOUS | Status: DC

## 2020-02-22 MED ORDER — HEPARIN SODIUM (PORCINE) 5000 UNIT/ML IJ SOLN
5000.0000 [IU] | Freq: Three times a day (TID) | INTRAMUSCULAR | Status: DC
  Administered 2020-02-22 – 2020-02-23 (×2): 5000 [IU] via SUBCUTANEOUS
  Filled 2020-02-22 (×2): qty 1

## 2020-02-22 MED ORDER — CEFAZOLIN SODIUM-DEXTROSE 2-4 GM/100ML-% IV SOLN
2.0000 g | INTRAVENOUS | Status: DC
  Filled 2020-02-22: qty 100

## 2020-02-22 MED ORDER — EZETIMIBE 10 MG PO TABS
10.0000 mg | ORAL_TABLET | Freq: Every day | ORAL | Status: DC
  Administered 2020-02-22 – 2020-02-23 (×2): 10 mg via ORAL
  Filled 2020-02-22 (×2): qty 1

## 2020-02-22 MED ORDER — METOPROLOL TARTRATE 5 MG/5ML IV SOLN
INTRAVENOUS | Status: AC
  Administered 2020-02-22: 08:00:00 5 mg via INTRAVENOUS
  Filled 2020-02-22: qty 5

## 2020-02-22 MED ORDER — METOPROLOL TARTRATE 50 MG PO TABS
ORAL_TABLET | ORAL | Status: AC
  Filled 2020-02-22: qty 2

## 2020-02-22 MED ORDER — GABAPENTIN 300 MG PO CAPS
300.0000 mg | ORAL_CAPSULE | Freq: Every day | ORAL | Status: DC
  Administered 2020-02-22: 300 mg via ORAL
  Filled 2020-02-22: qty 1

## 2020-02-22 MED ORDER — MUPIROCIN 2 % EX OINT
TOPICAL_OINTMENT | CUTANEOUS | Status: AC
  Filled 2020-02-22: qty 22

## 2020-02-22 MED ORDER — PANTOPRAZOLE SODIUM 40 MG PO TBEC
40.0000 mg | DELAYED_RELEASE_TABLET | Freq: Every day | ORAL | Status: DC
  Administered 2020-02-23: 40 mg via ORAL
  Filled 2020-02-22: qty 1

## 2020-02-22 MED ORDER — ROSUVASTATIN CALCIUM 20 MG PO TABS
40.0000 mg | ORAL_TABLET | Freq: Every day | ORAL | Status: DC
  Administered 2020-02-22: 22:00:00 40 mg via ORAL
  Filled 2020-02-22: qty 2

## 2020-02-22 MED ORDER — NITROGLYCERIN 0.4 MG SL SUBL
SUBLINGUAL_TABLET | SUBLINGUAL | Status: AC
  Filled 2020-02-22: qty 1

## 2020-02-22 MED ORDER — INSULIN REGULAR(HUMAN) IN NACL 100-0.9 UT/100ML-% IV SOLN
INTRAVENOUS | Status: DC
  Filled 2020-02-22: qty 100

## 2020-02-22 MED ORDER — INSULIN ASPART 100 UNIT/ML ~~LOC~~ SOLN
SUBCUTANEOUS | Status: AC
  Administered 2020-02-22: 15 [IU] via SUBCUTANEOUS
  Filled 2020-02-22: qty 1

## 2020-02-22 MED ORDER — MIDAZOLAM HCL 2 MG/2ML IJ SOLN
INTRAMUSCULAR | Status: AC
  Filled 2020-02-22: qty 2

## 2020-02-22 MED ORDER — NITROGLYCERIN 0.4 MG SL SUBL
0.4000 mg | SUBLINGUAL_TABLET | SUBLINGUAL | Status: DC | PRN
  Administered 2020-02-22: 0.4 mg via SUBLINGUAL

## 2020-02-22 MED ORDER — INSULIN ASPART 100 UNIT/ML ~~LOC~~ SOLN
12.0000 [IU] | Freq: Once | SUBCUTANEOUS | Status: AC
  Administered 2020-02-22: 12 [IU] via SUBCUTANEOUS

## 2020-02-22 MED ORDER — ASPIRIN EC 81 MG PO TBEC
81.0000 mg | DELAYED_RELEASE_TABLET | Freq: Every day | ORAL | Status: DC
  Administered 2020-02-23: 81 mg via ORAL
  Filled 2020-02-22: qty 1

## 2020-02-22 NOTE — Interval H&P Note (Signed)
History and Physical Interval Note:  02/22/2020 6:50 AM  Ian [name]  has presented today for surgery, with the diagnosis of CRITICAL LOWER LIMB ISCHEMIA.  The various methods of treatment have been discussed with the patient and family. After consideration of risks, benefits and other options for treatment, the patient has consented to  Procedure(s): BYPASS GRAFT FEMORAL-POPLITEAL ARTERY (Right) as a surgical intervention.  The patient's history has been reviewed, patient examined, no change in status, stable for surgery.  I have reviewed the patient's chart and labs.  Questions were answered to the patient's satisfaction.     Curt Jews

## 2020-02-22 NOTE — Progress Notes (Addendum)
Inpatient Diabetes Program Recommendations  AACE/ADA: New Consensus Statement on Inpatient Glycemic Control (2015)  Target Ranges:  Prepandial:   less than 140 mg/dL      Peak postprandial:   less than 180 mg/dL (1-2 hours)      Critically ill patients:  140 - 180 mg/dL   Lab Results  Component Value Date   GLUCAP 329 (H) 02/22/2020   HGBA1C 9.8 (H) 01/04/2020    Review of Glycemic Control Results for Ian [name], Ian [name] (MRN [id number]) as of 02/22/2020 11:19  Ref. Range 02/18/2020 13:54 02/22/2020 06:09 02/22/2020 07:18 02/22/2020 08:03 02/22/2020 08:04 02/22/2020 10:14  Glucose-Capillary Latest Ref Range: 70 - 99 mg/dL 187 (H) 366 (H) 364 (H) 388 (H) 389 (H) 329 (H)   Diabetes history: DM 2 Outpatient Diabetes medications: Basaglar 40 units q PM, 70/30 20 units bid, Januvia 100 mg daily, Metformin 500 mg bid  Current orders for Inpatient glycemic control:  None  Inpatient Diabetes Program Recommendations:    Note that patient will be admitted.  Consider adding Levemir 25 units bid.  Also please add Novolog moderate q 4 hours (while NPO). Once eating, will need the addition of Novolog meal coverage 4 units tid with meals.    Thanks,  Adah Perl, RN, BC-ADM Inpatient Diabetes Coordinator Pager [phone] (8a-5p)

## 2020-02-22 NOTE — Progress Notes (Signed)
Patient ID: Ian [name], male   DOB: 1956-10-08, 64 y.o.   MRN: [id number] The patient was seen in preoperative and was being prepared for femoral to popliteal bypass.  He began having chest pain and left arm pain.  Heart rate in the 130s and ST changes.  Dr. Burt Knack evaluated him felt that this may be related to discontinuation of his beta-blocker and fast heart rate.  His surgery was canceled for today.  Will be admitted by cardiology for serial cardiac enzymes.  We will plan femoropopliteal bypass during this admission once his cardiac status is stabilized

## 2020-02-22 NOTE — Progress Notes (Signed)
Report called to [facility] on 6E.

## 2020-02-22 NOTE — Progress Notes (Signed)
Pt's CBG 366. Dr. Nyoka Cowden and Dr. Donnetta Hutching both made aware. No orders given.

## 2020-02-22 NOTE — H&P (Signed)
Cardiology Admission History and Physical:   Patient ID: Ian [name] MRN: [id number]; DOB: Mar 26, 1956   Admission date: 02/22/2020  Primary Care Provider: Sharilyn Sites, MD Primary Cardiologist: Sherren Mocha, MD  Primary Electrophysiologist:  None   Chief Complaint:  Chest pain  Patient Profile:   Ian [name] is a 64 y.o. male with history of extensive coronary artery disease and critical limb ischemia of the right leg, presenting today for planned femoropopliteal bypass, but noted to be tachycardic with chest pressure and ischemic EKG changes on arrival for surgery  History of Present Illness:   Ian [name] has an extensive cardiac history with prior CABG and 2 recent PCI procedures.  History is summarized as follows:  Coronary artery disease  ? S/p CABG in 2008 ? S/p STEMI in 08/2019 >> PCI: DES x 2 to radial-PDA  S/p staged PCI: orbital atherectomy, DES x 2 to protected LM in the LCx ? S/p inf-lat STEMI 12/2019 >> DES to Radial-PDA  Ischemic CM ? Echocardiogram 12/2019: EF 45-50, Gr 2 DD, mild MR, mild AS (mean 6)  Metastatic colon CA (mets to liver) ? S/p partial colectomy and wedge resection of liver  Diabetes mellitus 2   Hypertension   Peripheral neuropathy   GERD  The patient presented today for right femoral-popliteal bypass surgery.  After arrival to the short stay, he developed substernal chest discomfort.  He was noted to have a heart rate of about 130 bpm.  He admits that he felt quite anxious all night and this morning.  An EKG showed sinus tachycardia with diffuse ST depression consistent with ischemia.  Cardiology consultation is requested.  At the time of my interview with the patient, he is feeling better.  He has received 1 sublingual nitroglycerin with resolution of his chest discomfort.  His heart rate remains elevated and he is given metoprolol 5 mg IV x1.  Since his last PCI procedure, he has had some improvement in his chest discomfort.  He  still takes nitroglycerin intermittently but has good resolution of chest pain with this.  He has not taken any nitroglycerin in the last week.  States that now he is primarily limited by his right leg.  He denies shortness of breath, orthopnea, PND, or swelling.   Past Medical History:  Diagnosis Date  . 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 Colon cancer ([place])   . Complication of anesthesia    atypical pseudo-cholinesterase deficiency  . Coronary artery disease involving native coronary artery of native heart with unstable angina pectoris ([place]) 01/10/2013    A) Mv CAD- CABGx 4 (LIMA-LAD, SVG-rPDA, SVG-OM1, SVG-OM2z);09/14/19: NSTEMI - Cath: p-mRCA, Ost-prox LAD, OM3 & SVG-OM1, SVG-OM2 100% CTO; Patentl LIMA-LAD. m-dLM 95% - p-mCx 90%, m-d Cx 90%. 2 Site DES PCI: LRad-RPDA - prox 95% (Resolute Onyx DES 2.75 x 15) , m-d 99% (Resolute Onyx 2.75 x 22); c) 09/16/19: Orbital Atherectomy LM-p-m LCx - Overlapping DES 2.5 x 34 ( LCx) -> 3 x 30 (LM-LCx)  . Diabetes mellitus without complication ([place])   . Family history of adverse reaction to anesthesia    sister also has atypical pseudo-cholinesterase deficiency  . GERD (gastroesophageal reflux disease)   . History of heart bypass surgery   . Hypertension   . Myocardial infarction [facility])     Past Surgical History:  Procedure Laterality Date  . ABDOMINAL AORTOGRAM W/LOWER EXTREMITY Bilateral 02/18/2020   Procedure: ABDOMINAL AORTOGRAM W/LOWER EXTREMITY;  Surgeon: Marty Heck, MD;  Location: [place];  Service: Cardiovascular;  Laterality: Bilateral;  . COLONOSCOPY N/A 08/29/2018   Procedure: COLONOSCOPY;  Surgeon: Daneil Dolin, MD;  [facility];  Service: Endoscopy;  Laterality: N/A;  9:30  . CORONARY ARTERY BYPASS GRAFT     4 vessels  . CORONARY ATHERECTOMY N/A 09/15/2019   Procedure: CORONARY ATHERECTOMY;  Surgeon: Sherren Mocha, MD;  Location: [place];  Service: Cardiovascular;  LM-LCx Atherectomy -  DES PCI (*Protected LM)   . CORONARY STENT INTERVENTION N/A 09/15/2019   Procedure: CORONARY STENT INTERVENTION;  Surgeon: Sherren Mocha, MD;  Location: [place] [facility];; Successful orbital atherectomy, PTCA, and overlapping DES treatment of severe calcific stenosis in the left main and left circumflex using a 2.5x34 mm Resolute Onyx DES (left circumflex) and 3.0x30 mm Resolute Onyx DES (overlapped proximally in the LCx extending into left main)  . CORONARY STENT INTERVENTION N/A 01/04/2020   Procedure: CORONARY STENT INTERVENTION;  Surgeon: Sherren Mocha, MD;  Location: [place] [facility];  Service: Cardiovascular;  Laterality: N/A;  . CORONARY/GRAFT ACUTE MI REVASCULARIZATION N/A 09/14/2019   Procedure: Coronary/Graft Acute MI Revascularization;  Surgeon: Sherren Mocha, MD;  Location: [place] [facility];  Service: Cardiovascular;  Laterality: N/A;  . heart bypass    . INSERTION OF MESH  02/07/2015   Procedure: INSERTION OF MESH;  Surgeon: Aviva Signs Md, MD;  Location: [facility];  Service: General;;  . INTRAVASCULAR ULTRASOUND/IVUS N/A 01/04/2020   Procedure: Intravascular Ultrasound/IVUS;  Surgeon: Sherren Mocha, MD;  Location: [place] [facility];  Service: Cardiovascular;  Laterality: N/A;  . LEFT HEART CATH AND CORONARY ANGIOGRAPHY N/A 09/14/2019   Procedure: LEFT HEART CATH AND CORONARY ANGIOGRAPHY;  Surgeon: Sherren Mocha, MD;  Location: [facility];  Service: Cardiovascular;  Laterality: N/A;  . LEFT HEART CATH AND CORONARY ANGIOGRAPHY N/A 01/04/2020   Procedure: LEFT HEART CATH AND CORONARY ANGIOGRAPHY;  Surgeon: Sherren Mocha, MD;  Location: [place] [facility];  Service: Cardiovascular;  Laterality: N/A;  . PARTIAL COLECTOMY N/A 09/17/2018   Procedure: PARTIAL COLECTOMY WITH PARTIAL WEDGE RESECTION LIVER METASTASIS;  Surgeon: Aviva Signs, MD;  Location: [facility];  Service: General;  Laterality: N/A;  . POLYPECTOMY  08/29/2018   Procedure: POLYPECTOMY;  Surgeon: Daneil Dolin, MD;  [facility];  Service: Endoscopy;;  . PORTACATH PLACEMENT Left 10/10/2018   Procedure: INSERTION PORT-A-CATH (catheter in left subclavian);  Surgeon: Aviva Signs, MD;  Location: [facility];  Service: General;  Laterality: Left;  . UMBILICAL HERNIA REPAIR N/A 02/07/2015   Procedure: UMBILICAL HERNIORRHAPHY WITH MESH;  Surgeon: Aviva Signs Md, MD;  Location: [facility];  Service: General;  Laterality: N/A;     Medications Prior to Admission: Prior to Admission medications   Medication Sig Start Date End Date Taking? Authorizing Provider  aspirin EC 81 MG tablet Take 81 mg by mouth daily.   Yes [provider]  Coenzyme Q10 (COQ10) 100 MG CAPS Take 100 mg by mouth daily.   Yes [provider]  diphenhydramine-acetaminophen (TYLENOL PM) 25-500 MG TABS tablet Take 2 tablets by mouth at bedtime.   Yes [provider]  FARXIGA 10 MG TABS tablet Take 10 mg by mouth daily.  12/19/18  Yes [provider]  gabapentin (NEURONTIN) 300 MG capsule Take 300 mg by mouth at bedtime.   Yes [provider]  insulin aspart protamine- aspart (NOVOLOG MIX 70/30) (70-30) 100 UNIT/ML injection Inject 20 Units into the skin in the morning, at noon, and  at bedtime.    Yes [provider]  Insulin Glargine (BASAGLAR KWIKPEN) 100 UNIT/ML SOPN Inject 40 Units into the skin at bedtime.  12/10/18  Yes [provider]  isosorbide mononitrate (IMDUR) 60 MG 24 hr tablet Take 1 tablet (60 mg total) by mouth daily. 02/09/20 05/09/20 Yes Weaver, Scott T, PA-C  JANUVIA 100 MG tablet Take 100 mg by mouth daily.  12/19/18  Yes [provider]  metFORMIN (GLUCOPHAGE) 500 MG tablet Take 500 mg by mouth 2 (two) times daily. 08/27/19  Yes [provider]  nitroGLYCERIN (NITROSTAT) 0.4 MG SL tablet Place 1 tablet (0.4 mg total) under the tongue every 5 (five) minutes x 3 doses as needed for chest pain. 02/09/20  Yes Weaver, Scott T, PA-C  Omega-3  Fatty Acids (FISH OIL) 1000 MG CAPS Take 1,000 mg by mouth daily.    Yes [provider]  pantoprazole (PROTONIX) 40 MG tablet Take 40 mg by mouth daily.   Yes [provider]  prasugrel (EFFIENT) 10 MG TABS tablet Take 1 tablet (10 mg total) by mouth daily. 01/07/20  Yes Reino Bellis B, NP  ramipril (ALTACE) 10 MG capsule Take 10 mg by mouth daily.   Yes [provider]  rosuvastatin (CRESTOR) 40 MG tablet Take 1 tablet (40 mg total) by mouth at bedtime. 01/06/20 04/05/20 Yes Reino Bellis B, NP  amLODipine (NORVASC) 10 MG tablet TAKE ONE TABLET BY MOUTH ONCE DAILY. Patient not taking: Reported on 02/17/2020 03/23/19   Francene Finders L, NP-C  ezetimibe (ZETIA) 10 MG tablet Take 1 tablet (10 mg total) by mouth daily. Patient not taking: Reported on 02/17/2020 09/17/19   Reino Bellis B, NP  hydrochlorothiazide (HYDRODIURIL) 25 MG tablet Take 1 tablet (25 mg total) by mouth daily. 09/24/19 02/17/20  Leanor Kail, PA  metoprolol succinate (TOPROL-XL) 100 MG 24 hr tablet Take 1 tablet (100 mg total) by mouth 2 (two) times daily. Take with or immediately following a meal. Patient not taking: Reported on 02/17/2020 09/16/19   Cheryln Manly, NP     Allergies:    Allergies  Allergen Reactions  . Anectine [Succinylcholine] Other (See Comments)    Atypical pseudocholinesterase deficiency.   . Nsaids Other (See Comments)    Contraindicated (can only have asa 81mg )    Social History:   Social History   Socioeconomic History  . Marital status: Married    Spouse name: Not on file  . Number of children: 3  . Years of education: Not on file  . Highest education level: Not on file  Occupational History  . Occupation: Programmer, systems: [employer]  Tobacco Use  . Smoking status: Never Smoker  . Smokeless tobacco: Never Used  Substance and Sexual Activity  . Alcohol use: No  . Drug use: No  . Sexual activity: Yes    Birth  control/protection: None  Other Topics Concern  . Not on file  Social History Narrative  . Not on file   Social Determinants of Health   Financial Resource Strain:   . Difficulty of Paying Living Expenses:   Food Insecurity:   . Worried About Charity fundraiser in the Last Year:   . Arboriculturist in the Last Year:   Transportation Needs:   . Film/video editor (Medical):   Marland Kitchen Lack of Transportation (Non-Medical):   Physical Activity:   . Days of Exercise per Week:   . Minutes of Exercise per  Session:   Stress:   . Feeling of Stress :   Social Connections:   . Frequency of Communication with Friends and Family:   . Frequency of Social Gatherings with Friends and Family:   . Attends Religious Services:   . Active Member of Clubs or Organizations:   . Attends Archivist Meetings:   Marland Kitchen Marital Status:   Intimate Partner Violence:   . Fear of Current or Ex-Partner:   . Emotionally Abused:   Marland Kitchen Physically Abused:   . Sexually Abused:     Family History:   The patient's family history includes Diabetes in his brother, brother, father, sister, and sister; Heart disease in his brother and father; Hypertension in his brother, father, sister, and son; Lymphoma in his brother.    ROS:  Please see the history of present illness.  All other ROS reviewed and negative.     Physical Exam/Data:   Vitals:   02/22/20 0815 02/22/20 0820 02/22/20 0825 02/22/20 0830  BP: (!) 141/103 128/74 130/70 (!) 150/54  Pulse: (!) 104 (!) 103 (!) 103 (!) 106  Resp: 16 17 12 15   Temp:      TempSrc:      SpO2: 100% 100% 100% 100%  Weight:      Height:       No intake or output data in the 24 hours ending 02/22/20 0831 Last 3 Weights 02/22/2020 02/18/2020 02/16/2020  Weight (lbs) 198 lb 6.6 oz 198 lb 189 lb 9.6 oz  Weight (kg) 90 kg 89.812 kg 86.002 kg     Body mass index is 34.06 kg/m.  General:  Well nourished, well developed, in no acute distress HEENT: normal Lymph: no  adenopathy Neck: no JVD Endocrine:  No thryomegaly Vascular: No carotid bruits; FA pulses 2+ bilaterally   Cardiac:  normal S1, S2; tachycardic and regular; no murmur  Lungs:  clear to auscultation bilaterally, no wheezing, rhonchi or rales  Abd: soft, nontender, no hepatomegaly  Ext: no edema, feet are wrapped Musculoskeletal:  No deformities, BUE and BLE strength normal and equal Skin: warm and dry  Neuro:  CNs 2-12 intact, no focal abnormalities noted Psych:  Normal affect    EKG:  The ECG that was done this morning was personally reviewed and demonstrates sinus tachycardia 130 bpm, ST/T wave changes consider diffuse subendocardial ischemia  Relevant CV Studies: Echocardiogram 01/04/2020: IMPRESSIONS    1. Left ventricular ejection fraction, by estimation, is 45 to 50%. The  left ventricle has mildly decreased function. The left ventricle  demonstrates global hypokinesis. There is mild concentric left ventricular  hypertrophy. Left ventricular diastolic  parameters are consistent with Grade II diastolic dysfunction  (pseudonormalization). Elevated left atrial pressure.  2. Right ventricular systolic function is normal. The right ventricular  size is normal. Tricuspid regurgitation signal is inadequate for assessing  PA pressure.  3. Left atrial size was mild to moderately dilated.  4. The mitral valve is degenerative. Mild mitral valve regurgitation.  5. The aortic valve is tricuspid. Aortic valve regurgitation is not  visualized. Mild aortic valve stenosis.  6. The inferior vena cava is dilated in size with >50% respiratory  variability, suggesting right atrial pressure of 8 mmHg.   Comparison(s): A prior study was performed on 09/14/2019. Prior images  reviewed side by side. Changes from prior study are noted. The left  ventricular function is worsened.  Cardiac catheterization 01/04/2020: Conclusion  1.  Severe multivessel coronary artery disease with chronic  total occlusion of  the LAD and RCA, continued patency of the left mainstem and left circumflex stents with focal mild to moderate in-stent restenosis at the transition of the left main into the circumflex. 2.  Status post aortocoronary bypass surgery with continued patency of the LIMA to LAD graft and severe aorto ostial stenosis of the radial graft to PDA treated successfully with PCI using a 3.0 x 30 mm resolute Onyx DES with intravascular ultrasound guidance 3.  Nonobstructive ostial left circumflex restenosis as demonstrated by intravascular ultrasound with a minimal lumen area of 5.96 mm  Recommendations: Long-term dual antiplatelet therapy with     Laboratory Data:  High Sensitivity Troponin:  No results for input(s): TROPONINIHS in the last 720 hours.    Chemistry Recent Labs  Lab 02/18/20 1132 02/22/20 0635  NA 139 137  K 3.8 3.4*  CL 102 97*  CO2  --  22  GLUCOSE 257* 389*  BUN 32* 39*  CREATININE 1.10 1.34*  CALCIUM  --  9.7  GFRNONAA  --  56*  GFRAA  --  >60  ANIONGAP  --  18*    Recent Labs  Lab 02/22/20 0635  PROT 7.1  ALBUMIN 3.8  AST 20  ALT 29  ALKPHOS 86  BILITOT 0.4   Hematology Recent Labs  Lab 02/18/20 1132 02/22/20 0635  WBC  --  11.2*  RBC  --  4.61  HGB 11.9* 13.2  HCT 35.0* 39.6  MCV  --  85.9  MCH  --  28.6  MCHC  --  33.3  RDW  --  13.4  PLT  --  262   BNPNo results for input(s): BNP, PROBNP in the last 168 hours.  DDimer No results for input(s): DDIMER in the last 168 hours.   Radiology/Studies:  No results found. { TIMI Risk Score for Unstable Angina or Non-ST Elevation MI:   The patient's TIMI risk score is 5, which indicates a 26% risk of all cause mortality, new or recurrent myocardial infarction or need for urgent revascularization in the next 14 days.   Assessment and Plan:   1. Unstable angina: Patient with resting chest discomfort and ischemic EKG changes.  He has known CAD with 2 recent heart catheterizations and  PCI procedures for ACS.  I think today's episode is likely precipitated by a combination of anxiety about surgery and beta-blocker withdrawal in this gentleman with known CAD.  He has held metoprolol succinate now for 4 days and has initially responded well to a single dose of IV metoprolol.  We will get him back on metoprolol succinate 100 mg twice daily starting this morning.  We will cycle cardiac markers and I would expect him to have at least a small increase in high-sensitivity troponin.  If he has a considerable elevation of troponin, will need to consider anticoagulation with heparin and further cardiac testing.  Otherwise if he does well clinically I think he will be able to proceed with his vascular surgery in the near future. 2. Hypertension: Will reinitiate all of his home medications and follow his blood pressure closely. 3. Type 2 diabetes, uncontrolled: Blood glucose is greater than 300 this morning.  Will restart his home regimen at full dose and adjust based on timing of surgery.  Will cover with sliding scale insulin while here. 4. Critical limb ischemia of the right foot: Plans for femoropopliteal bypass, hopefully in the next 24 to 48 hours.  His perioperative risk of cardiac complications is increased but not prohibitive.  Surgery is urgent for limb salvage purposes and I am hopeful that he can move forward with surgery over the next day or 2 pending his cardiac stability.  Severity of Illness: The appropriate patient status for this patient is INPATIENT. Inpatient status is judged to be reasonable and necessary in order to provide the required intensity of service to ensure the patient's safety. The patient's presenting symptoms, physical exam findings, and initial radiographic and laboratory data in the context of their chronic comorbidities is felt to place them at high risk for further clinical deterioration.    * I certify that at the point of admission it is my clinical judgment  that the patient will require inpatient hospital care spanning beyond 2 midnights from the point of admission due to high intensity of service, high risk for further deterioration and high frequency of surveillance required.*    For questions or updates, please contact [place] Please consult [email or web address] for contact info under        Signed, Sherren Mocha, MD  02/22/2020 8:31 AM

## 2020-02-22 NOTE — Progress Notes (Signed)
Patient transferred to [place] with nurse tech and nurse.  Wife at bedside.  Receiving nurse, Vernie Shanks, at bedside when patient arrived to room.  Patient had no complaints at time of transfer.

## 2020-02-23 ENCOUNTER — Telehealth: Payer: Self-pay

## 2020-02-23 ENCOUNTER — Encounter (HOSPITAL_COMMUNITY): Payer: Self-pay | Admitting: Cardiovascular Disease

## 2020-02-23 DIAGNOSIS — R072 Precordial pain: Secondary | ICD-10-CM

## 2020-02-23 LAB — BASIC METABOLIC PANEL
Anion gap: 14 (ref 5–15)
BUN: 37 mg/dL — ABNORMAL HIGH (ref 8–23)
CO2: 24 mmol/L (ref 22–32)
Calcium: 9.3 mg/dL (ref 8.9–10.3)
Chloride: 99 mmol/L (ref 98–111)
Creatinine, Ser: 0.94 mg/dL (ref 0.61–1.24)
GFR calc Af Amer: >60 mL/min (ref >60)
GFR calc non Af Amer: >60 mL/min (ref >60)
Glucose, Bld: 286 mg/dL — ABNORMAL HIGH (ref 70–99)
Potassium: 3.5 mmol/L (ref 3.5–5.1)
Sodium: 137 mmol/L (ref 135–145)

## 2020-02-23 LAB — LIPID PANEL
Cholesterol: 143 mg/dL (ref 0–200)
HDL: 21 mg/dL — ABNORMAL LOW (ref >40)
LDL Cholesterol: 58 mg/dL (ref 0–99)
Total CHOL/HDL Ratio: 6.8 RATIO
Triglycerides: 321 mg/dL — ABNORMAL HIGH (ref <150)
VLDL: 64 mg/dL — ABNORMAL HIGH (ref 0–40)

## 2020-02-23 LAB — GLUCOSE, CAPILLARY: Glucose-Capillary: 276 mg/dL — ABNORMAL HIGH (ref 70–99)

## 2020-02-23 MED ORDER — LIVING WELL WITH DIABETES BOOK
Freq: Once | Status: DC
  Filled 2020-02-23: qty 1

## 2020-02-23 NOTE — Telephone Encounter (Signed)
Spoke to pt's wife regarding questions about pt's medication to take/hold before surgery, as he was canceled the other day. I spoke to PAT and they will call pt tomorrow to review this. I have let his wife know.

## 2020-02-23 NOTE — Progress Notes (Signed)
Patient ID: Ian [name], male   DOB: Feb 21, 1956, 64 y.o.   MRN: [id number]  Progress Note    02/23/2020 10:51 AM 1 Day Post-Op  Subjective: Comfortable this morning.  Heart rate controlled.  No further chest pain   Vitals:   02/23/20 0355 02/23/20 0828  BP: 131/67 123/65  Pulse: 92 88  Resp: 18   Temp: 98.7 F (37.1 C) 98.2 F (36.8 C)  SpO2: 98% 98%   Physical Exam: No change  CBC    Component Value Date/Time   WBC 9.0 02/22/2020 1511   RBC 4.33 02/22/2020 1511   HGB 12.1 (L) 02/22/2020 1511   HCT 36.7 (L) 02/22/2020 1511   PLT 254 02/22/2020 1511   MCV 84.8 02/22/2020 1511   MCH 27.9 02/22/2020 1511   MCHC 33.0 02/22/2020 1511   RDW 13.5 02/22/2020 1511   LYMPHSABS 4.6 (H) 01/04/2020 0120   MONOABS 1.2 (H) 01/04/2020 0120   EOSABS 0.1 01/04/2020 0120   BASOSABS 0.1 01/04/2020 0120    BMET    Component Value Date/Time   NA 137 02/23/2020 0320   K 3.5 02/23/2020 0320   CL 99 02/23/2020 0320   CO2 24 02/23/2020 0320   GLUCOSE 286 (H) 02/23/2020 0320   BUN 37 (H) 02/23/2020 0320   CREATININE 0.94 02/23/2020 0320   CALCIUM 9.3 02/23/2020 0320   GFRNONAA >60 02/23/2020 0320   GFRAA >60 02/23/2020 0320    INR    Component Value Date/Time   INR 1.0 02/22/2020 0635     Intake/Output Summary (Last 24 hours) at 02/23/2020 1051 Last data filed at 02/22/2020 1552 Gross per 24 hour  Intake 222 ml  Output --  Net 222 ml     Assessment/Plan:  64 y.o. male discussed with Dr. Burt Knack.  Feels safe to proceed with right femoral to popliteal bypass as scheduled from cardiac standpoint.  I discussed this with [name].  There is no availability for scheduled time tomorrow.  Can proceed on Thursday or Friday.  We will discuss this with our scheduling office and let him make the decision.  He could be discharged and readmitted or continued observation and surgery on Thursday     Rosetta Posner, MD [facility] [phone] 02/23/2020 10:51  AM

## 2020-02-23 NOTE — Discharge Summary (Signed)
Discharge Summary    Patient ID: Ian [name],  MRN: [id number], DOB/AGE: 04-20-1956 64 y.o.  Admit date: 02/22/2020 Discharge date: 02/23/2020  Primary Care Provider: Sharilyn Sites Primary Cardiologist: Sherren Mocha, MD  Discharge Diagnoses    Principal Problem:   Chest pain Active Problems:   Essential hypertension   Hyperlipidemia associated with type 2 diabetes mellitus ([place])   Diabetes mellitus type II, uncontrolled ([place])   Allergies Allergies  Allergen Reactions  . Anectine [Succinylcholine] Other (See Comments)    Atypical pseudocholinesterase deficiency.   . Nsaids Other (See Comments)    Contraindicated (can only have asa 81mg )    Diagnostic Studies/Procedures    N/a _____________   History of Present Illness     Ian [name] has an extensive cardiac history with prior CABG and 2 recent PCI procedures.  History is summarized as follows:  Coronary artery disease  ? S/p CABG in 2008 ? S/p STEMI in 08/2019 >> PCI: DES x 2 to radial-PDA  S/p staged PCI: orbital atherectomy, DES x 2 to protected LM in the LCx ? S/p inf-lat STEMI 12/2019 >> DES to Radial-PDA  Ischemic CM ? Echocardiogram 12/2019: EF 45-50, Gr 2 DD, mild MR, mild AS (mean 6)  Metastatic colon CA (mets to liver) ? S/p partial colectomy and wedge resection of liver  Diabetes mellitus 2   Hypertension   Peripheral neuropathy   GERD  The patient presented the day of admission for right femoral-popliteal bypass surgery.  After arrival to the short stay, he developed substernal chest discomfort.  He was noted to have a heart rate of about 130 bpm.  He admited that he felt quite anxious all night and this morning.  An EKG showed sinus tachycardia with diffuse ST depression consistent with ischemia.  Cardiology consultation is requested.  At the time of interview with the patient, he was feeling better.  He has received 1 sublingual nitroglycerin with resolution of his chest discomfort.  His  heart rate remained elevated and he was given metoprolol 5 mg IV x1.  Since his last PCI procedure, he had some improvement in his chest discomfort.  He still would take nitroglycerin intermittently but had good resolution of chest pain with this.  He had not taken any nitroglycerin in the last week. He was admitted to cardiology overnight for observation.    Hospital Course     hsTn were cycled with low flat trend, likely demand from his elevated heart rate. Heart rate improved with the resumption of his home medications, including metoprolol. No further chest pain overnight. Morning labs were stable. Seen by Dr. Donnetta Hutching but unable to reschedule his case until later in the week. He will be discharged home with plans to reschedule vascular surgery. Of note, his blood sugars were elevated but improved with resuming his home medications.   General: Well developed, well nourished, male appearing in no acute distress. Head: Normocephalic, atraumatic.  Neck: Supple without bruits, JVD. Lungs:  Resp regular and unlabored, CTA. Heart: RRR, S1, S2, no S3, S4, or murmur; no rub. Abdomen: Soft, non-tender, non-distended with normoactive bowel sounds. No hepatomegaly. No rebound/guarding. No obvious abdominal masses. Extremities: No clubbing, cyanosis, edema. Distal pedal pulses are 2+ bilaterally.  Neuro: Alert and oriented X 3. Moves all extremities spontaneously. Psych: Normal affect.  Ian [name] was seen by Dr. Angelena Form and determined stable for discharge home. Follow up in the office has been arranged. Medications are listed below.   _____________  Discharge Vitals Blood pressure 123/65, pulse 88, temperature 98.2 F (36.8 C), resp. rate 18, height 5\' 4"  (1.626 m), weight 83.8 kg, SpO2 98 %.  Filed Weights   02/22/20 0604 02/23/20 0355  Weight: 90 kg 83.8 kg    Labs & Radiologic Studies    CBC Recent Labs    02/22/20 0635 02/22/20 1511  WBC 11.2* 9.0  HGB 13.2 12.1*  HCT 39.6  36.7*  MCV 85.9 84.8  PLT 262 017   Basic Metabolic Panel Recent Labs    02/22/20 0635 02/22/20 0635 02/22/20 1511 02/23/20 0320  NA 137  --   --  137  K 3.4*  --   --  3.5  CL 97*  --   --  99  CO2 22  --   --  24  GLUCOSE 389*  --   --  286*  BUN 39*  --   --  37*  CREATININE 1.34*   < > 1.14 0.94  CALCIUM 9.7  --   --  9.3   < > = values in this interval not displayed.   Liver Function Tests Recent Labs    02/22/20 0635  AST 20  ALT 29  ALKPHOS 86  BILITOT 0.4  PROT 7.1  ALBUMIN 3.8   No results for input(s): LIPASE, AMYLASE in the last 72 hours. Cardiac Enzymes No results for input(s): CKTOTAL, CKMB, CKMBINDEX, TROPONINI in the last 72 hours. BNP Invalid input(s): POCBNP D-Dimer No results for input(s): DDIMER in the last 72 hours. Hemoglobin A1C No results for input(s): HGBA1C in the last 72 hours. Fasting Lipid Panel Recent Labs    02/23/20 0320  CHOL 143  HDL 21*  LDLCALC 58  TRIG 321*  CHOLHDL 6.8   Thyroid Function Tests No results for input(s): TSH, T4TOTAL, T3FREE, THYROIDAB in the last 72 hours.  Invalid input(s): FREET3 _____________  PERIPHERAL VASCULAR CATHETERIZATION  Result Date: 02/18/2020 Patient name: Ian [name]          MRN: [id number]        DOB: 1956-01-09          Sex: male  02/18/2020 Pre-operative Diagnosis: Critical limb ischemia of the right lower extremity with rest pain Post-operative diagnosis:  Same Surgeon:  Marty Heck, MD Procedure Performed: 1.  Ultrasound-guided access of the left common femoral artery 2.  Aortogram 3.  Right lower extremity arteriogram with selection of second-order branches 4.  Left lower extremity arteriogram with runoff from the left common femoral artery sheath 5.  33 minutes of monitored moderate conscious sedation time  Indications: Patient is a 64 year old male who was seen by Dr. Donnetta Hutching in consultation for nocturnal rest pain in the right lower extremity with history of bilateral lower  extremity claudication.  Ultimately he presents today for planned lower extremity arteriogram and possible intervention after risk and benefits are discussed.  Findings:  Aortogram showed patent renal arteries bilaterally and there was no flow-limiting stenosis in the aortoiliac segment.  The bilateral common iliac arteries were calcified with some disease but this was not flow-limiting.  Right lower extremity shows a patent common femoral with only profunda runoff and a flush SFA occlusion.  He has a long segment SFA occlusion with reconstitution of above-knee popliteal artery and then a patent below-knee popliteal artery and single-vessel runoff via peroneal artery.  Through a collateral off the peroneal distally he does reconstitute the posterior tibial distally.  The anterior tibial is occluded throughout its course.  Left  lower extremity shows a patent common femoral that is moderately diseased with only profunda runoff through a very diseased profunda.  He has a flush SFA occlusion on the left.  Long segment SFA occlusion with reconstitution of distal SFA/above knee popliteal artery and a patent below-knee popliteal artery with two-vessel runoff via anterior tibial and peroneal artery.             Procedure:  The patient was identified in the holding area and taken to room 8.  The patient was then placed supine on the table and prepped and draped in the usual sterile fashion.  A time out was called.  Ultrasound was used to evaluate the left common femoral artery.  It was patent .  A digital ultrasound image was acquired.  A micropuncture needle was used to access the left common femoral artery under ultrasound guidance.  An 018 wire was advanced without resistance and a micropuncture sheath was placed.  The 018 wire was removed and a benson wire was placed.  The micropuncture sheath was exchanged for a 5 french sheath.  An omniflush catheter was advanced over the wire to the level of L-1.  An abdominal  angiogram was obtained.  Next, using the omniflush catheter and a benson wire, the aortic bifurcation was crossed and the catheter was placed into theright external iliac artery and right runoff was obtained.   Next the wires and catheters were removed after right lower extremity runoff was obtained.  We then flushed the left common femoral sheath and injected contrast through the left groin sheath for left lower extremity runoff.  I did not want to place a closure device in the left common femoral artery given moderate disease.  Once we were done patient was taken to PACU to have his left groin sheath removed.  Plan: Patient will be vein mapped today for bilateral upper and lower extremity surface veins. He will be scheduled for a right common femoral to popliteal artery bypass with Dr. Donnetta Hutching next week.     Marty Heck, MD [facility] Office: [phone]   VAS [place] ABI WITH/WO TBI  Result Date: 02/16/2020 LOWER EXTREMITY DOPPLER STUDY Indications: Difficulty finding palpable pulse on right foot. Sore of right              great toe. High Risk Factors: Hypertension, hyperlipidemia, Diabetes, prior MI, coronary                    artery disease.  Performing Technologist: Ronal Fear RVS, RCS  Examination Guidelines: A complete evaluation includes at minimum, Doppler waveform signals and systolic blood pressure reading at the level of bilateral brachial, anterior tibial, and posterior tibial arteries, when vessel segments are accessible. Bilateral testing is considered an integral part of a complete examination. Photoelectric Plethysmograph (PPG) waveforms and toe systolic pressure readings are included as required and additional duplex testing as needed. Limited examinations for reoccurring indications may be performed as noted.  ABI Findings: +--------+------------------+-----+----------+--------+ Right   Rt Pressure (mmHg)IndexWaveform  Comment   +--------+------------------+-----+----------+--------+ [id number]                                       +--------+------------------+-----+----------+--------+ PTA     75                0.51 monophasic         +--------+------------------+-----+----------+--------+  DP                             absent             +--------+------------------+-----+----------+--------+ +--------+------------------+-----+----------+-------+ Left    Lt Pressure (mmHg)IndexWaveform  Comment +--------+------------------+-----+----------+-------+ [id number]                                      +--------+------------------+-----+----------+-------+ PTA     66                0.45 monophasic        +--------+------------------+-----+----------+-------+ DP      91                0.62 monophasic        +--------+------------------+-----+----------+-------+ +-------+-----------+-----------+------------+------------+ ABI/TBIToday's ABIToday's TBIPrevious ABIPrevious TBI +-------+-----------+-----------+------------+------------+ Right  0.51       0.00       0.46        0.00         +-------+-----------+-----------+------------+------------+ Left   0.62       0.00       0.54        0.34         +-------+-----------+-----------+------------+------------+  Summary: Right: Resting right ankle-brachial index indicates moderate right lower extremity arterial disease. The right toe-brachial index is abnormal. Left: Resting left ankle-brachial index indicates moderate left lower extremity arterial disease. The left toe-brachial index is abnormal.  *See table(s) above for measurements and observations.  Electronically signed by Curt Jews MD on 02/16/2020 at 12:35:02 PM.   Final    VAS [place] LOWER EXTREMITY SAPHENOUS VEIN MAPPING  Result Date: 02/18/2020 LOWER EXTREMITY VEIN MAPPING Indications: PAD pre op  Performing Technologist: Abram Sander RVS  Examination Guidelines: A complete  evaluation includes B-mode imaging, spectral Doppler, color Doppler, and power Doppler as needed of all accessible portions of each vessel. Bilateral testing is considered an integral part of a complete examination. Limited examinations for reoccurring indications may be performed as noted. +---------------+----------+---------------------+--------------+--------------+   RT Diameter      RT             GSV          LT Diameter   LT Findings        (cm)       Findings                           (cm)                    +---------------+----------+---------------------+--------------+--------------+      0.70                   Saphenofemoral                                                                Junction                                    +---------------+----------+---------------------+--------------+--------------+      0.39  Proximal thigh         0.29                    +---------------+----------+---------------------+--------------+--------------+      0.41                      Mid thigh           0.21                    +---------------+----------+---------------------+--------------+--------------+      0.36                    Distal thigh                   not visualized +---------------+----------+---------------------+--------------+--------------+      0.40                        Knee                       not visualized +---------------+----------+---------------------+--------------+--------------+      0.27                      Prox calf                    not visualized +---------------+----------+---------------------+--------------+--------------+      0.21                      Mid calf                     not visualized +---------------+----------+---------------------+--------------+--------------+      0.27                     Distal calf                   not visualized  +---------------+----------+---------------------+--------------+--------------+      0.24                        Ankle                      not visualized +---------------+----------+---------------------+--------------+--------------+ +----------------+-----------+---------------+----------------+--------------+ RT diameter (cm)RT Findings      SSV      LT Diameter (cm) LT Findings   +----------------+-----------+---------------+----------------+--------------+       0.26                 Popliteal fossa                               +----------------+-----------+---------------+----------------+--------------+       0.13                  Proximal calf                 not visualized +----------------+-----------+---------------+----------------+--------------+                               Mid calf                    not visualized +----------------+-----------+---------------+----------------+--------------+  Distal calf                  not visualized +----------------+-----------+---------------+----------------+--------------+ Diagnosing physician: Monica Martinez MD Electronically signed by Monica Martinez MD on 02/18/2020 at 5:17:46 PM.    Final    VAS [place] UPPER EXT VEIN MAPPING (PRE-OP AVF)  Result Date: 02/18/2020 UPPER EXTREMITY VEIN MAPPING  Indications: History of PAD; patient is pre-operative for bypass. Performing Technologist: Abram Sander RVS  Examination Guidelines: A complete evaluation includes B-mode imaging, spectral Doppler, color Doppler, and power Doppler as needed of all accessible portions of each vessel. Bilateral testing is considered an integral part of a complete examination. Limited examinations for reoccurring indications may be performed as noted. +-----------------+-------------+----------+--------+ Right Cephalic   Diameter (cm)Depth (cm)Findings +-----------------+-------------+----------+--------+ Shoulder              0.32        1.85            +-----------------+-------------+----------+--------+ Prox upper arm       0.27        1.26            +-----------------+-------------+----------+--------+ Mid upper arm        0.25        0.68            +-----------------+-------------+----------+--------+ Dist upper arm       0.25        0.53            +-----------------+-------------+----------+--------+ Antecubital fossa    0.35        0.74            +-----------------+-------------+----------+--------+ Prox forearm         0.22        0.65            +-----------------+-------------+----------+--------+ Mid forearm          0.25        0.60            +-----------------+-------------+----------+--------+ Dist forearm         0.26        0.30            +-----------------+-------------+----------+--------+ +-----------------+-------------+----------+--------+ Right Basilic    Diameter (cm)Depth (cm)Findings +-----------------+-------------+----------+--------+ Prox upper arm       0.49        1.86            +-----------------+-------------+----------+--------+ Mid upper arm        0.38        1.77            +-----------------+-------------+----------+--------+ Dist upper arm       0.41        1.00            +-----------------+-------------+----------+--------+ Antecubital fossa    0.27        0.49            +-----------------+-------------+----------+--------+ Prox forearm         0.35        0.64            +-----------------+-------------+----------+--------+ Mid forearm          0.26        0.49            +-----------------+-------------+----------+--------+ Distal forearm       0.16        0.33            +-----------------+-------------+----------+--------+ +-----------------+-------------+----------+--------------+ Left Cephalic  Diameter (cm)Depth (cm)   Findings     +-----------------+-------------+----------+--------------+ Shoulder             0.31        1.48                  +-----------------+-------------+----------+--------------+ Prox upper arm       0.26        1.12                  +-----------------+-------------+----------+--------------+ Mid upper arm        0.36        0.68                  +-----------------+-------------+----------+--------------+ Dist upper arm       0.33        0.69                  +-----------------+-------------+----------+--------------+ Antecubital fossa    0.33        0.35                  +-----------------+-------------+----------+--------------+ Prox forearm         0.24        0.74                  +-----------------+-------------+----------+--------------+ Mid forearm          0.18        0.56                  +-----------------+-------------+----------+--------------+ Dist forearm                            not visualized +-----------------+-------------+----------+--------------+ +-----------------+-------------+----------+--------------+ Left Basilic     Diameter (cm)Depth (cm)   Findings    +-----------------+-------------+----------+--------------+ Shoulder                                not visualized +-----------------+-------------+----------+--------------+ Prox upper arm                          not visualized +-----------------+-------------+----------+--------------+ Mid upper arm        0.30        0.94                  +-----------------+-------------+----------+--------------+ Dist upper arm                          not visualized +-----------------+-------------+----------+--------------+ Antecubital fossa                       not visualized +-----------------+-------------+----------+--------------+ Prox forearm                            not visualized +-----------------+-------------+----------+--------------+ Mid forearm                              not visualized +-----------------+-------------+----------+--------------+ Distal forearm                          not visualized +-----------------+-------------+----------+--------------+ Elbow  not visualized +-----------------+-------------+----------+--------------+ Wrist                                   not visualized +-----------------+-------------+----------+--------------+ *See table(s) above for measurements and observations.  Diagnosing physician: Monica Martinez MD Electronically signed by Monica Martinez MD on 02/18/2020 at 5:18:26 PM.    Final    Disposition   Pt is being discharged home today in good condition.  Follow-up Plans & Appointments    Follow-up Information    Early, Arvilla Meres, MD Follow up.   Specialties: Vascular Surgery, Cardiology Why: Office will contact you about rescheduling. Contact information: [address] [place] [address] [phone]          Discharge Instructions    Diet - low sodium heart healthy   Complete by: As directed    Increase activity slowly   Complete by: As directed        Discharge Medications     Medication List    TAKE these medications   amLODipine 10 MG tablet Commonly known as: NORVASC TAKE ONE TABLET BY MOUTH ONCE DAILY.   aspirin EC 81 MG tablet Take 81 mg by mouth daily.   Basaglar KwikPen 100 UNIT/ML Inject 40 Units into the skin at bedtime.   CoQ10 100 MG Caps Take 100 mg by mouth daily.   diphenhydramine-acetaminophen 25-500 MG Tabs tablet Commonly known as: TYLENOL PM Take 2 tablets by mouth at bedtime.   ezetimibe 10 MG tablet Commonly known as: ZETIA Take 1 tablet (10 mg total) by mouth daily.   Farxiga 10 MG Tabs tablet Generic drug: dapagliflozin propanediol Take 10 mg by mouth daily.   Fish Oil 1000 MG Caps Take 1,000 mg by mouth daily.   gabapentin 300 MG capsule Commonly known as: NEURONTIN Take 300 mg by mouth  at bedtime.   hydrochlorothiazide 25 MG tablet Commonly known as: HYDRODIURIL Take 1 tablet (25 mg total) by mouth daily.   insulin aspart protamine- aspart (70-30) 100 UNIT/ML injection Commonly known as: NOVOLOG MIX 70/30 Inject 20 Units into the skin in the morning, at noon, and at bedtime.   isosorbide mononitrate 60 MG 24 hr tablet Commonly known as: IMDUR Take 1 tablet (60 mg total) by mouth daily.   Januvia 100 MG tablet Generic drug: sitaGLIPtin Take 100 mg by mouth daily.   metFORMIN 500 MG tablet Commonly known as: GLUCOPHAGE Take 500 mg by mouth 2 (two) times daily.   metoprolol succinate 100 MG 24 hr tablet Commonly known as: TOPROL-XL Take 1 tablet (100 mg total) by mouth 2 (two) times daily. Take with or immediately following a meal.   nitroGLYCERIN 0.4 MG SL tablet Commonly known as: NITROSTAT Place 1 tablet (0.4 mg total) under the tongue every 5 (five) minutes x 3 doses as needed for chest pain.   pantoprazole 40 MG tablet Commonly known as: PROTONIX Take 40 mg by mouth daily.   prasugrel 10 MG Tabs tablet Commonly known as: EFFIENT Take 1 tablet (10 mg total) by mouth daily.   ramipril 10 MG capsule Commonly known as: ALTACE Take 10 mg by mouth daily.   rosuvastatin 40 MG tablet Commonly known as: Crestor Take 1 tablet (40 mg total) by mouth at bedtime.        No  Did the patient have a percutaneous coronary intervention (stent / angioplasty)?:  No.      Outstanding Labs/Studies   N/a  Duration of Discharge Encounter   Greater than 30 minutes including physician time.  Signed, Reino Bellis NP-C 02/23/2020, 11:13 AM   I have personally seen and examined this patient. I agree with the assessment and plan as outlined above.  He is feeling well this am. No chest pain. Will resume home medications and discharge home today. Vascular surgery team to reschedule his procedure.   Lauree Chandler 02/23/2020 11:13 AM

## 2020-02-23 NOTE — H&P (View-Only) (Signed)
Patient ID: Ian [name], male   DOB: [birth year], 64 y.o.   MRN: [id number]  Progress Note    02/23/2020 10:51 AM 1 Day Post-Op  Subjective: Comfortable this morning.  Heart rate controlled.  No further chest pain   Vitals:   02/23/20 0355 02/23/20 0828  BP: 131/67 123/65  Pulse: 92 88  Resp: 18   Temp: 98.7 F (37.1 C) 98.2 F (36.8 C)  SpO2: 98% 98%   Physical Exam: No change  CBC    Component Value Date/Time   WBC 9.0 02/22/2020 1511   RBC 4.33 02/22/2020 1511   HGB 12.1 (L) 02/22/2020 1511   HCT 36.7 (L) 02/22/2020 1511   PLT 254 02/22/2020 1511   MCV 84.8 02/22/2020 1511   MCH 27.9 02/22/2020 1511   MCHC 33.0 02/22/2020 1511   RDW 13.5 02/22/2020 1511   LYMPHSABS 4.6 (H) 01/04/2020 0120   MONOABS 1.2 (H) 01/04/2020 0120   EOSABS 0.1 01/04/2020 0120   BASOSABS 0.1 01/04/2020 0120    BMET    Component Value Date/Time   NA 137 02/23/2020 0320   K 3.5 02/23/2020 0320   CL 99 02/23/2020 0320   CO2 24 02/23/2020 0320   GLUCOSE 286 (H) 02/23/2020 0320   BUN 37 (H) 02/23/2020 0320   CREATININE 0.94 02/23/2020 0320   CALCIUM 9.3 02/23/2020 0320   GFRNONAA >60 02/23/2020 0320   GFRAA >60 02/23/2020 0320    INR    Component Value Date/Time   INR 1.0 02/22/2020 0635     Intake/Output Summary (Last 24 hours) at 02/23/2020 1051 Last data filed at 02/22/2020 1552 Gross per 24 hour  Intake 222 ml  Output --  Net 222 ml     Assessment/Plan:  64 y.o. male discussed with Dr. Burt Knack.  Feels safe to proceed with right femoral to popliteal bypass as scheduled from cardiac standpoint.  I discussed this with [name].  There is no availability for scheduled time tomorrow.  Can proceed on Thursday or Friday.  We will discuss this with our scheduling office and let him make the decision.  He could be discharged and readmitted or continued observation and surgery on Thursday     Rosetta Posner, MD [facility] [phone] 02/23/2020 10:51  AM

## 2020-02-23 NOTE — Progress Notes (Addendum)
Inpatient Diabetes Program Recommendations  AACE/ADA: New Consensus Statement on Inpatient Glycemic Control (2015)  Target Ranges:  Prepandial:   less than 140 mg/dL      Peak postprandial:   less than 180 mg/dL (1-2 hours)      Critically ill patients:  140 - 180 mg/dL   Lab Results  Component Value Date   GLUCAP 276 (H) 02/23/2020   HGBA1C 9.8 (H) 01/04/2020    Review of Glycemic Control  Results for Ian [name], Ian [name] (MRN [id number]) as of 02/23/2020 10:51  Ref. Range 02/22/2020 12:22 02/22/2020 14:09 02/22/2020 16:46 02/22/2020 22:34 02/23/2020 08:26  Glucose-Capillary Latest Ref Range: 70 - 99 mg/dL 398 (H) 314 (H) 328 (H) 335 (H) 276 (H)    Diabetes history: DM2 Outpatient Diabetes medications: Basaglar 20 units Daily + 70/30 20-24 units twice daily + Januvia 100 mg daily + Metformin 500 mg twice daily + Trulicity (recently prescribed, has only had 1 dose and does not know dose) Current orders for Inpatient glycemic control: Lantus 25 units twice daily + Novolog 0-15 units TID + Novolog 4 units TID with meals  Note:  Spoke with patient and wife at bedside.  He will be returning later this week for scheduled right femoral to popliteal bypass.  Spoke with patient about current A1C of 9.8% (average blood sugar of 235 mg/dl).   He states his last A1C was around 8%.  He has noticed his blood sugar readings have been elevated lately and he has been giving himself between 20-24 units of his 70/30.  He checks his blood sugar 4 times daily.  He does not drink anything with sugar and he watches his CHO intake.  He Sees Dr. Collene Mares every 3 months.  Dr. Collene Mares added Trucility a few weeks ago.  He has only had one dose and does not know the dose.   Denies difficulties obtaining medications.  When asked if he rotates his sites well,  he mentioned he only administers his insulin to the right abdomen because his left abdomen has a lot of scars from surgery.  He states it is too hard to administer in his arm.  Suggested  he use his lower abdominal side area "love handles" as well.  He does not experience any low blood sugars regularly.  He can feel when he is low and he treats with juice or something sweet.    Thank you, Reche Dixon, RN, BSN Diabetes Coordinator Inpatient Diabetes Program [phone] (team pager from 8a-5p)

## 2020-02-24 ENCOUNTER — Encounter (HOSPITAL_COMMUNITY): Payer: Self-pay | Admitting: Vascular Surgery

## 2020-02-24 ENCOUNTER — Other Ambulatory Visit
Admit: 2020-02-24 | Discharge: 2020-02-24 | Disposition: A | Discharge status: Home / Self Care | Payer: Federal, State, Local not specified - PPO | Source: Ambulatory Visit | Attending: Vascular Surgery | Admitting: Vascular Surgery

## 2020-02-24 ENCOUNTER — Other Ambulatory Visit: Payer: Self-pay

## 2020-02-24 DIAGNOSIS — Z20822 Contact with and (suspected) exposure to covid-19: Secondary | ICD-10-CM | POA: Insufficient documentation

## 2020-02-24 DIAGNOSIS — Z01812 Encounter for preprocedural laboratory examination: Secondary | ICD-10-CM | POA: Insufficient documentation

## 2020-02-24 LAB — SARS CORONAVIRUS 2 (TAT 6-24 HRS): SARS Coronavirus 2: NEGATIVE

## 2020-02-24 NOTE — Progress Notes (Signed)
SDW Pre-op call completed by pt spouse [name] (DPR). Spouse denies that pt C/O SOB and chest pain. Spouse stated that pt is under the care of Dr. Sherren Mocha, Cardiology. Spouse stated that pt had a stress test > 10 years ago. Spouse made aware to have pt stop taking vitamins, fish oil, CO Q 10 and herbal medications. Do not take any NSAIDs ie: Ibuprofen, Advil, Naproxen (Aleve), Motrin, BC and Goody Powder. Spouse made aware to have pt hold Farxiga the day before surgery ( pt had already took medication this AM) , take 70% (14 units) of Novolog 70/30 at dinner and 50% ( 20 units) of Basaglar insuin at HS the night before surgery. Take no diabetes medication (Insulin, Metformin, Januvia) on DOS. Spouse made aware to have pt check CBG every 2 hours prior to arrival to hospital on DOS. Spouse made aware to treat a CBG < 70 with 4 ounces of apple or cranberry juice, wait 15 minutes after intervention to recheck CBG, if CBG remains < 70, call Short Stay unit to speak with a nurse.  Spouse made aware to have pt continue to quarantine. Spouse verbalized understanding of all pre-op instructions

## 2020-02-25 ENCOUNTER — Inpatient Hospital Stay (HOSPITAL_COMMUNITY): Payer: Federal, State, Local not specified - PPO | Admitting: Anesthesiology

## 2020-02-25 ENCOUNTER — Encounter (HOSPITAL_COMMUNITY): Payer: Self-pay | Admitting: Vascular Surgery

## 2020-02-25 ENCOUNTER — Other Ambulatory Visit: Payer: Self-pay

## 2020-02-25 ENCOUNTER — Inpatient Hospital Stay (HOSPITAL_COMMUNITY)
Admission: RE | Admit: 2020-02-25 | Discharge: 2020-02-28 | DRG: 253 | Disposition: A | Discharge status: Home / Self Care | Payer: Federal, State, Local not specified - PPO | Attending: Vascular Surgery | Admitting: Vascular Surgery

## 2020-02-25 ENCOUNTER — Encounter (HOSPITAL_COMMUNITY)
Admission: RE | Disposition: A | Discharge status: Home / Self Care | Payer: Self-pay | Source: Home / Self Care | Attending: Vascular Surgery

## 2020-02-25 DIAGNOSIS — I739 Peripheral vascular disease, unspecified: Secondary | ICD-10-CM | POA: Diagnosis present

## 2020-02-25 DIAGNOSIS — K219 Gastro-esophageal reflux disease without esophagitis: Secondary | ICD-10-CM | POA: Diagnosis present

## 2020-02-25 DIAGNOSIS — I4891 Unspecified atrial fibrillation: Secondary | ICD-10-CM | POA: Diagnosis not present

## 2020-02-25 DIAGNOSIS — I25118 Atherosclerotic heart disease of native coronary artery with other forms of angina pectoris: Secondary | ICD-10-CM | POA: Diagnosis not present

## 2020-02-25 DIAGNOSIS — I998 Other disorder of circulatory system: Secondary | ICD-10-CM | POA: Diagnosis not present

## 2020-02-25 DIAGNOSIS — Z951 Presence of aortocoronary bypass graft: Secondary | ICD-10-CM

## 2020-02-25 DIAGNOSIS — I2582 Chronic total occlusion of coronary artery: Secondary | ICD-10-CM | POA: Diagnosis not present

## 2020-02-25 DIAGNOSIS — I10 Essential (primary) hypertension: Secondary | ICD-10-CM | POA: Diagnosis not present

## 2020-02-25 DIAGNOSIS — Z85038 Personal history of other malignant neoplasm of large intestine: Secondary | ICD-10-CM

## 2020-02-25 DIAGNOSIS — T82855A Stenosis of coronary artery stent, initial encounter: Secondary | ICD-10-CM | POA: Diagnosis not present

## 2020-02-25 DIAGNOSIS — I480 Paroxysmal atrial fibrillation: Secondary | ICD-10-CM | POA: Diagnosis not present

## 2020-02-25 DIAGNOSIS — Z833 Family history of diabetes mellitus: Secondary | ICD-10-CM | POA: Diagnosis not present

## 2020-02-25 DIAGNOSIS — E119 Type 2 diabetes mellitus without complications: Secondary | ICD-10-CM | POA: Diagnosis not present

## 2020-02-25 DIAGNOSIS — Z794 Long term (current) use of insulin: Secondary | ICD-10-CM

## 2020-02-25 DIAGNOSIS — Y831 Surgical operation with implant of artificial internal device as the cause of abnormal reaction of the patient, or of later complication, without mention of misadventure at the time of the procedure: Secondary | ICD-10-CM | POA: Diagnosis present

## 2020-02-25 DIAGNOSIS — Z9861 Coronary angioplasty status: Secondary | ICD-10-CM | POA: Diagnosis not present

## 2020-02-25 DIAGNOSIS — I255 Ischemic cardiomyopathy: Secondary | ICD-10-CM | POA: Diagnosis not present

## 2020-02-25 DIAGNOSIS — L97519 Non-pressure chronic ulcer of other part of right foot with unspecified severity: Secondary | ICD-10-CM | POA: Diagnosis present

## 2020-02-25 DIAGNOSIS — C787 Secondary malignant neoplasm of liver and intrahepatic bile duct: Secondary | ICD-10-CM | POA: Diagnosis not present

## 2020-02-25 DIAGNOSIS — I252 Old myocardial infarction: Secondary | ICD-10-CM | POA: Diagnosis not present

## 2020-02-25 DIAGNOSIS — L0390 Cellulitis, unspecified: Secondary | ICD-10-CM | POA: Diagnosis not present

## 2020-02-25 DIAGNOSIS — I70221 Atherosclerosis of native arteries of extremities with rest pain, right leg: Secondary | ICD-10-CM | POA: Diagnosis not present

## 2020-02-25 DIAGNOSIS — I25110 Atherosclerotic heart disease of native coronary artery with unstable angina pectoris: Secondary | ICD-10-CM | POA: Diagnosis not present

## 2020-02-25 DIAGNOSIS — Z7982 Long term (current) use of aspirin: Secondary | ICD-10-CM | POA: Diagnosis not present

## 2020-02-25 DIAGNOSIS — E1152 Type 2 diabetes mellitus with diabetic peripheral angiopathy with gangrene: Principal | ICD-10-CM | POA: Diagnosis present

## 2020-02-25 DIAGNOSIS — I70261 Atherosclerosis of native arteries of extremities with gangrene, right leg: Secondary | ICD-10-CM | POA: Diagnosis not present

## 2020-02-25 DIAGNOSIS — I200 Unstable angina: Secondary | ICD-10-CM | POA: Diagnosis not present

## 2020-02-25 DIAGNOSIS — Z8249 Family history of ischemic heart disease and other diseases of the circulatory system: Secondary | ICD-10-CM | POA: Diagnosis not present

## 2020-02-25 DIAGNOSIS — I214 Non-ST elevation (NSTEMI) myocardial infarction: Secondary | ICD-10-CM | POA: Diagnosis not present

## 2020-02-25 DIAGNOSIS — Z79899 Other long term (current) drug therapy: Secondary | ICD-10-CM | POA: Diagnosis not present

## 2020-02-25 DIAGNOSIS — I2510 Atherosclerotic heart disease of native coronary artery without angina pectoris: Secondary | ICD-10-CM | POA: Diagnosis not present

## 2020-02-25 HISTORY — PX: FEMORAL-POPLITEAL BYPASS GRAFT: SHX937

## 2020-02-25 LAB — BASIC METABOLIC PANEL
Anion gap: 15 (ref 5–15)
BUN: 37 mg/dL — ABNORMAL HIGH (ref 8–23)
CO2: 20 mmol/L — ABNORMAL LOW (ref 22–32)
Calcium: 8.6 mg/dL — ABNORMAL LOW (ref 8.9–10.3)
Chloride: 101 mmol/L (ref 98–111)
Creatinine, Ser: 1.58 mg/dL — ABNORMAL HIGH (ref 0.61–1.24)
GFR calc Af Amer: 54 mL/min — ABNORMAL LOW (ref >60)
GFR calc non Af Amer: 46 mL/min — ABNORMAL LOW (ref >60)
Glucose, Bld: 402 mg/dL — ABNORMAL HIGH (ref 70–99)
Potassium: 4.6 mmol/L (ref 3.5–5.1)
Sodium: 136 mmol/L (ref 135–145)

## 2020-02-25 LAB — CBC
HCT: 34 % — ABNORMAL LOW (ref 39.0–52.0)
HCT: 31.7 % — ABNORMAL LOW (ref 39.0–52.0)
Hemoglobin: 11.1 g/dL — ABNORMAL LOW (ref 13.0–17.0)
Hemoglobin: 10.3 g/dL — ABNORMAL LOW (ref 13.0–17.0)
MCH: 28.4 pg (ref 26.0–34.0)
MCH: 28.7 pg (ref 26.0–34.0)
MCHC: 32.6 g/dL (ref 30.0–36.0)
MCHC: 32.5 g/dL (ref 30.0–36.0)
MCV: 87 fL (ref 80.0–100.0)
MCV: 88.3 fL (ref 80.0–100.0)
Platelets: 260 10*3/uL (ref 150–400)
Platelets: 238 10*3/uL (ref 150–400)
RBC: 3.91 MIL/uL — ABNORMAL LOW (ref 4.22–5.81)
RBC: 3.59 MIL/uL — ABNORMAL LOW (ref 4.22–5.81)
RDW: 13.4 % (ref 11.5–15.5)
RDW: 13.7 % (ref 11.5–15.5)
WBC: 14.9 10*3/uL — ABNORMAL HIGH (ref 4.0–10.5)
WBC: 10.8 10*3/uL — ABNORMAL HIGH (ref 4.0–10.5)
nRBC: 0 % (ref 0.0–0.2)
nRBC: 0 % (ref 0.0–0.2)

## 2020-02-25 LAB — CREATININE, SERUM
Creatinine, Ser: 1.33 mg/dL — ABNORMAL HIGH (ref 0.61–1.24)
GFR calc Af Amer: >60 mL/min (ref >60)
GFR calc non Af Amer: 57 mL/min — ABNORMAL LOW (ref >60)

## 2020-02-25 LAB — GLUCOSE, CAPILLARY
Glucose-Capillary: 163 mg/dL — ABNORMAL HIGH (ref 70–99)
Glucose-Capillary: 123 mg/dL — ABNORMAL HIGH (ref 70–99)
Glucose-Capillary: 172 mg/dL — ABNORMAL HIGH (ref 70–99)
Glucose-Capillary: 192 mg/dL — ABNORMAL HIGH (ref 70–99)
Glucose-Capillary: 278 mg/dL — ABNORMAL HIGH (ref 70–99)
Glucose-Capillary: 418 mg/dL — ABNORMAL HIGH (ref 70–99)

## 2020-02-25 LAB — TROPONIN I (HIGH SENSITIVITY): Troponin I (High Sensitivity): 16 ng/L (ref <18)

## 2020-02-25 SURGERY — BYPASS GRAFT FEMORAL-POPLITEAL ARTERY
Anesthesia: General | Site: Leg Upper | Laterality: Right

## 2020-02-25 MED ORDER — LINAGLIPTIN 5 MG PO TABS
5.0000 mg | ORAL_TABLET | Freq: Every day | ORAL | Status: DC
  Administered 2020-02-25 – 2020-02-28 (×4): 5 mg via ORAL
  Filled 2020-02-25 (×4): qty 1

## 2020-02-25 MED ORDER — ACETAMINOPHEN 325 MG RE SUPP: 325.0000 mg | RECTAL | Status: DC | PRN

## 2020-02-25 MED ORDER — HYDRALAZINE HCL 20 MG/ML IJ SOLN: 5.0000 mg | INTRAMUSCULAR | Status: DC | PRN

## 2020-02-25 MED ORDER — CEFAZOLIN SODIUM-DEXTROSE 2-3 GM-%(50ML) IV SOLR
INTRAVENOUS | Status: DC | PRN
  Administered 2020-02-25: 2 g via INTRAVENOUS

## 2020-02-25 MED ORDER — POTASSIUM CHLORIDE CRYS ER 20 MEQ PO TBCR: 20.0000 meq | EXTENDED_RELEASE_TABLET | Freq: Every day | ORAL | Status: DC | PRN

## 2020-02-25 MED ORDER — SODIUM CHLORIDE 0.9 % IV SOLN
INTRAVENOUS | Status: DC | PRN
  Administered 2020-02-25: 500 mL

## 2020-02-25 MED ORDER — PROPOFOL 10 MG/ML IV BOLUS
INTRAVENOUS | Status: AC
  Filled 2020-02-25: qty 20

## 2020-02-25 MED ORDER — PROPOFOL 10 MG/ML IV BOLUS
INTRAVENOUS | Status: DC | PRN
  Administered 2020-02-25: 100 mg via INTRAVENOUS
  Administered 2020-02-25: 20 mg via INTRAVENOUS

## 2020-02-25 MED ORDER — LIDOCAINE 2% (20 MG/ML) 5 ML SYRINGE
INTRAMUSCULAR | Status: AC
  Filled 2020-02-25: qty 5

## 2020-02-25 MED ORDER — LABETALOL HCL 5 MG/ML IV SOLN: 10.0000 mg | INTRAVENOUS | Status: DC | PRN

## 2020-02-25 MED ORDER — PRASUGREL HCL 10 MG PO TABS
10.0000 mg | ORAL_TABLET | Freq: Every day | ORAL | Status: DC
  Administered 2020-02-26 – 2020-02-27 (×2): 10 mg via ORAL
  Filled 2020-02-25 (×2): qty 1

## 2020-02-25 MED ORDER — ASPIRIN EC 81 MG PO TBEC
81.0000 mg | DELAYED_RELEASE_TABLET | Freq: Every day | ORAL | Status: DC
  Administered 2020-02-26 – 2020-02-28 (×3): 81 mg via ORAL
  Filled 2020-02-25 (×3): qty 1

## 2020-02-25 MED ORDER — ACETAMINOPHEN 500 MG PO TABS
1000.0000 mg | ORAL_TABLET | Freq: Once | ORAL | Status: AC
  Administered 2020-02-25: 1000 mg via ORAL
  Filled 2020-02-25: qty 2

## 2020-02-25 MED ORDER — PHENOL 1.4 % MT LIQD: 1.0000 | OROMUCOSAL | Status: DC | PRN

## 2020-02-25 MED ORDER — METFORMIN HCL 500 MG PO TABS
500.0000 mg | ORAL_TABLET | Freq: Two times a day (BID) | ORAL | Status: DC
  Administered 2020-02-26 – 2020-02-28 (×5): 500 mg via ORAL
  Filled 2020-02-25 (×5): qty 1

## 2020-02-25 MED ORDER — ISOSORBIDE MONONITRATE ER 60 MG PO TB24: 60.0000 mg | ORAL_TABLET | Freq: Every day | ORAL | Status: DC

## 2020-02-25 MED ORDER — INSULIN ASPART 100 UNIT/ML ~~LOC~~ SOLN
0.0000 [IU] | Freq: Every day | SUBCUTANEOUS | Status: DC
  Administered 2020-02-25: 5 [IU] via SUBCUTANEOUS
  Administered 2020-02-26: 2 [IU] via SUBCUTANEOUS
  Administered 2020-02-27: 3 [IU] via SUBCUTANEOUS

## 2020-02-25 MED ORDER — LACTATED RINGERS IV SOLN: INTRAVENOUS | Status: DC | PRN

## 2020-02-25 MED ORDER — FENTANYL CITRATE (PF) 100 MCG/2ML IJ SOLN
INTRAMUSCULAR | Status: DC | PRN
  Administered 2020-02-25 (×3): 50 ug via INTRAVENOUS
  Administered 2020-02-25: 100 ug via INTRAVENOUS

## 2020-02-25 MED ORDER — PHENYLEPHRINE HCL-NACL 20-0.9 MG/250ML-% IV SOLN
INTRAVENOUS | Status: DC | PRN
  Administered 2020-02-25: 25 ug/min via INTRAVENOUS

## 2020-02-25 MED ORDER — ROCURONIUM BROMIDE 50 MG/5ML IV SOSY
PREFILLED_SYRINGE | INTRAVENOUS | Status: DC | PRN
  Administered 2020-02-25: 10 mg via INTRAVENOUS
  Administered 2020-02-25 (×2): 25 mg via INTRAVENOUS
  Administered 2020-02-25: 60 mg via INTRAVENOUS

## 2020-02-25 MED ORDER — SODIUM CHLORIDE 0.9 % IV SOLN: INTRAVENOUS | Status: DC

## 2020-02-25 MED ORDER — DOCUSATE SODIUM 100 MG PO CAPS
100.0000 mg | ORAL_CAPSULE | Freq: Every day | ORAL | Status: DC
  Administered 2020-02-26 – 2020-02-28 (×3): 100 mg via ORAL
  Filled 2020-02-25 (×3): qty 1

## 2020-02-25 MED ORDER — 0.9 % SODIUM CHLORIDE (POUR BTL) OPTIME
TOPICAL | Status: DC | PRN
  Administered 2020-02-25: 2000 mL

## 2020-02-25 MED ORDER — HEPARIN SODIUM (PORCINE) 5000 UNIT/ML IJ SOLN
5000.0000 [IU] | Freq: Three times a day (TID) | INTRAMUSCULAR | Status: DC
  Administered 2020-02-25 – 2020-02-27 (×5): 5000 [IU] via SUBCUTANEOUS
  Filled 2020-02-25 (×4): qty 1

## 2020-02-25 MED ORDER — SUGAMMADEX SODIUM 200 MG/2ML IV SOLN
INTRAVENOUS | Status: DC | PRN
  Administered 2020-02-25: 200 mg via INTRAVENOUS

## 2020-02-25 MED ORDER — GUAIFENESIN-DM 100-10 MG/5ML PO SYRP: 15.0000 mL | ORAL_SOLUTION | ORAL | Status: DC | PRN

## 2020-02-25 MED ORDER — ONDANSETRON HCL 4 MG/2ML IJ SOLN
4.0000 mg | Freq: Four times a day (QID) | INTRAMUSCULAR | Status: DC | PRN
  Filled 2020-02-25: qty 2

## 2020-02-25 MED ORDER — OXYCODONE HCL 5 MG PO TABS
5.0000 mg | ORAL_TABLET | ORAL | Status: DC | PRN
  Administered 2020-02-26: 5 mg via ORAL
  Filled 2020-02-25 (×2): qty 1

## 2020-02-25 MED ORDER — SODIUM CHLORIDE 0.9 % IV SOLN
INTRAVENOUS | Status: DC | PRN
  Administered 2020-02-25: 40 ug via INTRAVENOUS
  Administered 2020-02-25 (×2): 80 ug via INTRAVENOUS
  Administered 2020-02-25: 40 ug via INTRAVENOUS

## 2020-02-25 MED ORDER — GABAPENTIN 300 MG PO CAPS
300.0000 mg | ORAL_CAPSULE | Freq: Every day | ORAL | Status: DC
  Administered 2020-02-25 – 2020-02-27 (×3): 300 mg via ORAL
  Filled 2020-02-25 (×3): qty 1

## 2020-02-25 MED ORDER — ONDANSETRON HCL 4 MG/2ML IJ SOLN
INTRAMUSCULAR | Status: DC | PRN
  Administered 2020-02-25: 4 mg via INTRAVENOUS

## 2020-02-25 MED ORDER — HYDROCHLOROTHIAZIDE 25 MG PO TABS
25.0000 mg | ORAL_TABLET | Freq: Every day | ORAL | Status: DC
  Administered 2020-02-26 – 2020-02-28 (×3): 25 mg via ORAL
  Filled 2020-02-25 (×4): qty 1

## 2020-02-25 MED ORDER — FENTANYL CITRATE (PF) 100 MCG/2ML IJ SOLN: 25.0000 ug | INTRAMUSCULAR | Status: DC | PRN

## 2020-02-25 MED ORDER — ROSUVASTATIN CALCIUM 20 MG PO TABS
40.0000 mg | ORAL_TABLET | Freq: Every day | ORAL | Status: DC
  Administered 2020-02-25 – 2020-02-27 (×3): 40 mg via ORAL
  Filled 2020-02-25: qty 8
  Filled 2020-02-25 (×2): qty 2

## 2020-02-25 MED ORDER — MIDAZOLAM HCL 2 MG/2ML IJ SOLN
INTRAMUSCULAR | Status: AC
  Filled 2020-02-25: qty 2

## 2020-02-25 MED ORDER — PANTOPRAZOLE SODIUM 40 MG PO TBEC
40.0000 mg | DELAYED_RELEASE_TABLET | Freq: Every day | ORAL | Status: DC
  Administered 2020-02-26 – 2020-02-28 (×3): 40 mg via ORAL
  Filled 2020-02-25 (×3): qty 1

## 2020-02-25 MED ORDER — SODIUM CHLORIDE 0.9 % IV SOLN
INTRAVENOUS | Status: AC
  Filled 2020-02-25: qty 1.2

## 2020-02-25 MED ORDER — SODIUM CHLORIDE 0.9 % IV SOLN: 500.0000 mL | Freq: Once | INTRAVENOUS | Status: DC | PRN

## 2020-02-25 MED ORDER — METOPROLOL TARTRATE 50 MG PO TABS
50.0000 mg | ORAL_TABLET | Freq: Four times a day (QID) | ORAL | Status: DC
  Administered 2020-02-26 (×2): 50 mg via ORAL
  Filled 2020-02-25 (×2): qty 1

## 2020-02-25 MED ORDER — ZOLPIDEM TARTRATE 5 MG PO TABS: 5.0000 mg | ORAL_TABLET | Freq: Every evening | ORAL | Status: DC | PRN

## 2020-02-25 MED ORDER — PROMETHAZINE HCL 25 MG/ML IJ SOLN: 6.2500 mg | INTRAMUSCULAR | Status: DC | PRN

## 2020-02-25 MED ORDER — NITROGLYCERIN IN D5W 200-5 MCG/ML-% IV SOLN
2.0000 ug/min | INTRAVENOUS | Status: DC
  Administered 2020-02-25: 23:00:00 25 ug/min via INTRAVENOUS
  Administered 2020-02-25: 5 ug/min via INTRAVENOUS
  Filled 2020-02-25: qty 250

## 2020-02-25 MED ORDER — ACETAMINOPHEN 325 MG PO TABS: 325.0000 mg | ORAL_TABLET | ORAL | Status: DC | PRN

## 2020-02-25 MED ORDER — MORPHINE SULFATE (PF) 2 MG/ML IV SOLN: 2.0000 mg | INTRAVENOUS | Status: DC | PRN

## 2020-02-25 MED ORDER — RAMIPRIL 10 MG PO CAPS
10.0000 mg | ORAL_CAPSULE | Freq: Every day | ORAL | Status: DC
  Administered 2020-02-26 – 2020-02-28 (×3): 10 mg via ORAL
  Filled 2020-02-25 (×3): qty 1

## 2020-02-25 MED ORDER — ROCURONIUM BROMIDE 10 MG/ML (PF) SYRINGE
PREFILLED_SYRINGE | INTRAVENOUS | Status: AC
  Filled 2020-02-25: qty 10

## 2020-02-25 MED ORDER — CEFAZOLIN SODIUM-DEXTROSE 2-4 GM/100ML-% IV SOLN
INTRAVENOUS | Status: AC
  Filled 2020-02-25: qty 100

## 2020-02-25 MED ORDER — METOPROLOL TARTRATE 25 MG PO TABS
25.0000 mg | ORAL_TABLET | Freq: Four times a day (QID) | ORAL | Status: DC
  Administered 2020-02-25: 25 mg via ORAL
  Filled 2020-02-25: qty 1

## 2020-02-25 MED ORDER — INSULIN GLARGINE 100 UNIT/ML ~~LOC~~ SOLN
40.0000 [IU] | Freq: Every day | SUBCUTANEOUS | Status: DC
  Administered 2020-02-25 – 2020-02-27 (×3): 40 [IU] via SUBCUTANEOUS
  Filled 2020-02-25 (×4): qty 0.4

## 2020-02-25 MED ORDER — INSULIN ASPART 100 UNIT/ML ~~LOC~~ SOLN
0.0000 [IU] | Freq: Three times a day (TID) | SUBCUTANEOUS | Status: DC
  Administered 2020-02-25: 8 [IU] via SUBCUTANEOUS
  Administered 2020-02-26: 11 [IU] via SUBCUTANEOUS
  Administered 2020-02-26 (×2): 8 [IU] via SUBCUTANEOUS
  Administered 2020-02-27: 5 [IU] via SUBCUTANEOUS
  Administered 2020-02-27 – 2020-02-28 (×2): 3 [IU] via SUBCUTANEOUS
  Administered 2020-02-28: 2 [IU] via SUBCUTANEOUS

## 2020-02-25 MED ORDER — DEXAMETHASONE SODIUM PHOSPHATE 10 MG/ML IJ SOLN
INTRAMUSCULAR | Status: DC | PRN
  Administered 2020-02-25: 5 mg via INTRAVENOUS

## 2020-02-25 MED ORDER — SENNOSIDES-DOCUSATE SODIUM 8.6-50 MG PO TABS: 1.0000 | ORAL_TABLET | Freq: Every evening | ORAL | Status: DC | PRN

## 2020-02-25 MED ORDER — NITROGLYCERIN 0.4 MG SL SUBL
0.4000 mg | SUBLINGUAL_TABLET | SUBLINGUAL | Status: DC | PRN
  Administered 2020-02-25 – 2020-02-26 (×8): 0.4 mg via SUBLINGUAL
  Filled 2020-02-25 (×4): qty 1

## 2020-02-25 MED ORDER — MIDAZOLAM HCL 5 MG/5ML IJ SOLN
INTRAMUSCULAR | Status: DC | PRN
  Administered 2020-02-25 (×2): 1 mg via INTRAVENOUS

## 2020-02-25 MED ORDER — MAGNESIUM SULFATE 2 GM/50ML IV SOLN: 2.0000 g | Freq: Every day | INTRAVENOUS | Status: DC | PRN

## 2020-02-25 MED ORDER — FENTANYL CITRATE (PF) 250 MCG/5ML IJ SOLN
INTRAMUSCULAR | Status: AC
  Filled 2020-02-25: qty 5

## 2020-02-25 MED ORDER — METOPROLOL TARTRATE 5 MG/5ML IV SOLN
2.0000 mg | INTRAVENOUS | Status: AC | PRN
  Administered 2020-02-25 – 2020-02-27 (×2): 5 mg via INTRAVENOUS
  Filled 2020-02-25 (×2): qty 5

## 2020-02-25 MED ORDER — ONDANSETRON HCL 4 MG/2ML IJ SOLN
INTRAMUSCULAR | Status: AC
  Filled 2020-02-25: qty 4

## 2020-02-25 MED ORDER — ALUM & MAG HYDROXIDE-SIMETH 200-200-20 MG/5ML PO SUSP: 15.0000 mL | ORAL | Status: DC | PRN

## 2020-02-25 MED ORDER — HEPARIN SODIUM (PORCINE) 1000 UNIT/ML IJ SOLN
INTRAMUSCULAR | Status: DC | PRN
  Administered 2020-02-25: 9000 [IU] via INTRAVENOUS

## 2020-02-25 MED ORDER — NITROGLYCERIN 0.4 MG SL SUBL
SUBLINGUAL_TABLET | SUBLINGUAL | Status: AC
  Administered 2020-02-25: 0.4 mg
  Filled 2020-02-25: qty 1

## 2020-02-25 MED ORDER — LIDOCAINE 2% (20 MG/ML) 5 ML SYRINGE
INTRAMUSCULAR | Status: DC | PRN
  Administered 2020-02-25: 80 mg via INTRAVENOUS

## 2020-02-25 MED ORDER — PROTAMINE SULFATE 10 MG/ML IV SOLN
INTRAVENOUS | Status: DC | PRN
  Administered 2020-02-25: 50 mg via INTRAVENOUS

## 2020-02-25 MED ORDER — CEFAZOLIN SODIUM-DEXTROSE 2-4 GM/100ML-% IV SOLN
2.0000 g | Freq: Three times a day (TID) | INTRAVENOUS | Status: AC
  Administered 2020-02-25 (×2): 2 g via INTRAVENOUS
  Filled 2020-02-25: qty 100

## 2020-02-25 SURGICAL SUPPLY — 55 items
ADH SKN CLS APL DERMABOND .7 (GAUZE/BANDAGES/DRESSINGS) ×3
BANDAGE ESMARK 6X9 LF (GAUZE/BANDAGES/DRESSINGS) IMPLANT
BLADE 10 SAFETY STRL DISP (BLADE) ×2 IMPLANT
BNDG CMPR 9X6 STRL LF SNTH (GAUZE/BANDAGES/DRESSINGS)
BNDG ESMARK 6X9 LF (GAUZE/BANDAGES/DRESSINGS)
CANISTER SUCT 3000ML PPV (MISCELLANEOUS) ×3 IMPLANT
CANNULA VESSEL 3MM 2 BLNT TIP (CANNULA) ×6 IMPLANT
CLIP LIGATING EXTRA MED SLVR (CLIP) ×3 IMPLANT
CLIP LIGATING EXTRA SM BLUE (MISCELLANEOUS) ×3 IMPLANT
COVER WAND RF STERILE (DRAPES) ×1 IMPLANT
CUFF TOURN SGL QUICK 34 (TOURNIQUET CUFF)
CUFF TOURN SGL QUICK 42 (TOURNIQUET CUFF) IMPLANT
CUFF TRNQT CYL 34X4.125X (TOURNIQUET CUFF) IMPLANT
DERMABOND ADVANCED (GAUZE/BANDAGES/DRESSINGS) ×6
DERMABOND ADVANCED .7 DNX12 (GAUZE/BANDAGES/DRESSINGS) IMPLANT
DRAIN SNY 10X20 3/4 PERF (WOUND CARE) IMPLANT
DRAPE HALF SHEET 40X57 (DRAPES) IMPLANT
DRAPE X-RAY CASS 24X20 (DRAPES) IMPLANT
ELECT REM PT RETURN 9FT ADLT (ELECTROSURGICAL) ×3
ELECTRODE REM PT RTRN 9FT ADLT (ELECTROSURGICAL) ×1 IMPLANT
EVACUATOR SILICONE 100CC (DRAIN) IMPLANT
GLOVE BIO SURGEON STRL SZ 6.5 (GLOVE) ×1 IMPLANT
GLOVE BIO SURGEON STRL SZ7 (GLOVE) ×4 IMPLANT
GLOVE BIO SURGEONS STRL SZ 6.5 (GLOVE) ×1
GLOVE BIOGEL PI IND STRL 6.5 (GLOVE) IMPLANT
GLOVE BIOGEL PI IND STRL 7.0 (GLOVE) IMPLANT
GLOVE BIOGEL PI INDICATOR 6.5 (GLOVE) ×4
GLOVE BIOGEL PI INDICATOR 7.0 (GLOVE) ×2
GLOVE SS BIOGEL STRL SZ 7.5 (GLOVE) ×1 IMPLANT
GLOVE SUPERSENSE BIOGEL SZ 7.5 (GLOVE) ×2
GOWN STRL REUS W/ TWL LRG LVL3 (GOWN DISPOSABLE) ×3 IMPLANT
GOWN STRL REUS W/TWL LRG LVL3 (GOWN DISPOSABLE) ×9
INSERT FOGARTY SM (MISCELLANEOUS) IMPLANT
KIT BASIN OR (CUSTOM PROCEDURE TRAY) ×3 IMPLANT
KIT TURNOVER KIT B (KITS) ×3 IMPLANT
NS IRRIG 1000ML POUR BTL (IV SOLUTION) ×6 IMPLANT
PACK PERIPHERAL VASCULAR (CUSTOM PROCEDURE TRAY) ×3 IMPLANT
PAD ARMBOARD 7.5X6 YLW CONV (MISCELLANEOUS) ×6 IMPLANT
PADDING CAST COTTON 6X4 STRL (CAST SUPPLIES) IMPLANT
SET COLLECT BLD 21X3/4 12 (NEEDLE) IMPLANT
STOPCOCK 4 WAY LG BORE MALE ST (IV SETS) IMPLANT
SUT ETHILON 3 0 PS 1 (SUTURE) IMPLANT
SUT PROLENE 5 0 C 1 24 (SUTURE) ×9 IMPLANT
SUT PROLENE 6 0 CC (SUTURE) ×9 IMPLANT
SUT SILK 2 0 SH (SUTURE) ×3 IMPLANT
SUT SILK 3 0 (SUTURE) ×6
SUT SILK 3-0 18XBRD TIE 12 (SUTURE) IMPLANT
SUT VIC AB 2-0 CTX 36 (SUTURE) ×6 IMPLANT
SUT VIC AB 3-0 SH 27 (SUTURE) ×9
SUT VIC AB 3-0 SH 27X BRD (SUTURE) ×2 IMPLANT
TOWEL GREEN STERILE (TOWEL DISPOSABLE) ×3 IMPLANT
TRAY FOLEY MTR SLVR 16FR STAT (SET/KITS/TRAYS/PACK) ×3 IMPLANT
TUBING EXTENTION W/L.L. (IV SETS) IMPLANT
UNDERPAD 30X30 (UNDERPADS AND DIAPERS) ×3 IMPLANT
WATER STERILE IRR 1000ML POUR (IV SOLUTION) ×3 IMPLANT

## 2020-02-25 NOTE — Progress Notes (Signed)
1809 Pt complaining of squeezing chest pain 8/10 radiating to L arm. BP 156/86 (107) HR 103. EKG obtained. 0.4 Nitro sublingual given and pain reduced to 4/10. BP 151/80 (102) HR 101. Dr. Donnetta Hutching paged and made aware.  1820 Pt chest now 0/10. Will continue to monitor.  Clyde Canterbury, RN

## 2020-02-25 NOTE — Interval H&P Note (Signed)
History and Physical Interval Note:  02/25/2020 7:19 AM  Ian [name]  has presented today for surgery, with the diagnosis of PERIPHERAL ARTERY DISEASE.  The various methods of treatment have been discussed with the patient and family. After consideration of risks, benefits and other options for treatment, the patient has consented to  Procedure(s): BYPASS GRAFT FEMORAL-POPLITEAL ARTERY (Right) as a surgical intervention.  The patient's history has been reviewed, patient examined, no change in status, stable for surgery.  I have reviewed the patient's chart and labs.  Questions were answered to the patient's satisfaction.     Curt Jews

## 2020-02-25 NOTE — Progress Notes (Signed)
Patient has had three episodes of chest pain in the past hour. Describes it as a sharp pain in his left chest and it radiates down his left arm.  Pain a level of 8 out of 10.  Heart rate jumped up a little from 103 to 110 bpm.  Given one Nitro tablet which helps to decrease the pain. Informed on call vascular surgeon, he will inform cardiology team to come evaluate the patient.  Will continue to monitor.  Lupita Dawn, RN

## 2020-02-25 NOTE — Anesthesia Procedure Notes (Signed)
Procedure Name: Intubation Date/Time: 02/25/2020 7:37 AM Performed by: Neldon Newport, CRNA Pre-anesthesia Checklist: Timeout performed, Patient being monitored, Suction available, Emergency Drugs available and Patient identified Patient Re-evaluated:Patient Re-evaluated prior to induction Oxygen Delivery Method: Circle system utilized Preoxygenation: Pre-oxygenation with 100% oxygen Induction Type: IV induction Ventilation: Mask ventilation without difficulty Laryngoscope Size: Mac and 3 Grade View: Grade I Tube type: Oral Tube size: 7.5 mm Number of attempts: 1 Placement Confirmation: ETT inserted through vocal cords under direct vision,  positive ETCO2 and breath sounds checked- equal and bilateral Secured at: 22 cm Tube secured with: Tape Dental Injury: Teeth and Oropharynx as per pre-operative assessment

## 2020-02-25 NOTE — Transfer of Care (Signed)
Immediate Anesthesia Transfer of Care Note  Patient: Ian [name]  Procedure(s) Performed: [place] ARTERY BYPASS GRAFT with Saphenous vein (Right Leg Upper)  Patient Location: PACU  Anesthesia Type:General  Level of Consciousness: awake, alert  and oriented  Airway & Oxygen Therapy: Patient Spontanous Breathing and Patient connected to nasal cannula oxygen  Post-op Assessment: Report given to RN, Post -op Vital signs reviewed and stable and Patient moving all extremities X 4  Post vital signs: Reviewed and stable  Last Vitals:  Vitals Value Taken Time  BP 142/73 02/25/20 1121  Temp    Pulse 86 02/25/20 1121  Resp 18 02/25/20 1121  SpO2 99 % 02/25/20 1121  Vitals shown include unvalidated device data.  Last Pain:  Vitals:   02/25/20 0709  TempSrc:   PainSc: 0-No pain         Complications: No apparent anesthesia complications

## 2020-02-25 NOTE — Op Note (Signed)
    OPERATIVE REPORT  DATE OF SURGERY: 02/25/2020  PATIENT: Ian [name], 64 y.o. male MRN: [id number]  DOB: May 13, 1956  PRE-OPERATIVE DIAGNOSIS: Critical limb ischemia right foot  POST-OPERATIVE DIAGNOSIS:  Same  PROCEDURE: Right femoral to above-knee popliteal bypass with reverse great saphenous vein  SURGEON:  Curt Jews, M.D.  PHYSICIAN ASSISTANT: Corrie Baglia PAC  ANESTHESIA: General  EBL: per anesthesia record  Total I/O In: 8527 [I.V.:1850] Out: 7824 [Urine:1000; Blood:170]  BLOOD ADMINISTERED: none  DRAINS: none  SPECIMEN: none  COUNTS CORRECT:  YES  PATIENT DISPOSITION:  PACU - hemodynamically stable  PROCEDURE DETAILS: Patient was taken operating placed to position with area of the right groin right leg prepped draped in sterile fashion.  SonoSite ultrasound was used to visualize the saphenous vein which was of good quality.  Incision was made over the common femoral artery at the groin and carried down to isolate the saphenous vein.  The tributary branches at the saphenofemoral junction were ligated with 3-0 silk ties and divided.  The common femoral artery was identified at the inguinal ligament and was encircled with a vessel loop.  The artery had a pulse but was extremely calcified.  There were also several Perclose devices which were removed.  The saphenous vein was unroofed from the groin to the knee and was good caliber.  Tributary branches were ligated with 3-0 silk ties and divided.  The above-knee popliteal artery was identified through the medial vein harvest incision just above the knee.  It was thickened but was patent.  This was controlled with Vesseloops.  A tunnel was created from the above-knee popliteal artery to the groin.  The saphenous vein was ligated distally and divided and the saphenous vein was occluded with a Cooley clamp at the saphenofemoral junction was transected and the saphenofemoral junction was oversewn with 5-0 Prolene sutures.   The vein was gently dilated and was of excellent caliber throughout.  The patient was given 9000 units of intravenous heparin after adequate circulation time the common femoral artery was occluded proximally and distally and was opened with an 11 blade and sent longitudinally with Potts scissors.  The vein was reversed and was spatulated and sewn end-to-side to the common femoral artery with a running 5-0 Prolene suture.  This anastomosis was tested and several additional 6-0 sutures were required for hemostasis.  Excellent flow was obtained through the vein graft.  The vein was brought through the prior created tunnel to the level of the above-knee popliteal artery.  The artery was occluded proximally distally and was opened 11 blade similarly with Potts scissors.  The artery was thickened but had a good lumen.  The vein was cut to the appropriate length and was spatulated and sewn end-to-side to the artery with a running 6-0 Prolene suture.  Prior to completion of the closure the usual flushing maneuvers were undertaken and anastomosis was completed.  Graft and Doppler flow was noted at the peroneal artery at the ankle.  The patient was given 50 mg of protamine reverse heparin.  Wounds were closed with 2-0 Vicryl in the fascia and 3-0 Vicryl in the subcutaneous and subcuticular tissue.  Sterile dressing was applied and the patient was transferred to the recovery room in stable condition   Rosetta Posner, M.D., [facility] 02/25/2020 11:10 AM

## 2020-02-25 NOTE — Progress Notes (Signed)
Patient ID: Ian [name], male   DOB: 26-Oct-1956, 64 y.o.   MRN: [id number] Comfortable currently.  His wife is in room with him.  He has had several episodes of chest pain which are relieved with nitroglycerin.  He has remained hemodynamically stable.  His right foot is warm and well-perfused with biphasic peroneal and posterior tibial signals.  No evidence of hematoma  I discussed with [facility] on call and have asked them to see Mr. Nephew this evening for evaluation of his chest pain with known coronary disease

## 2020-02-25 NOTE — Consult Note (Addendum)
Cardiology Consultation:   Patient ID: Ian [name] MRN: [id number]; DOB: 1956-02-26  Admit date: 02/25/2020 Date of Consult: 02/25/2020  Primary Care Provider: Sharilyn Sites, MD Primary Cardiologist: Sherren Mocha, MD  Primary Electrophysiologist:  None    Patient Profile:   Ian [name] is a 64 y.o. male with history of extensive coronary artery disease and critical limb ischemia of the right leg who presented today for planned femoropopliteal bypass, but developed chest pain postoperatively.   History of Present Illness:   Mr. Basnett has an extensive cardiac history with prior CABG and 2 recent PCI procedures.  History is summarized as follows:  Coronary artery disease  ? S/p CABG in 2008 ? S/p STEMI in 08/2019 >> PCI: DES x 2 to radial-PDA  S/p staged PCI: orbital atherectomy, DES x 2 to protected LM in the LCx ? S/p inf-lat STEMI 12/2019 >> DES to Radial-PDA  Ischemic CM ? Echocardiogram 12/2019: EF 45-50, Gr 2 DD, mild MR, mild AS (mean 6)  Metastatic colon CA (mets to liver) ? S/p partial colectomy and wedge resection of liver  Diabetes mellitus 2   Hypertension   Peripheral neuropathy   GERD  He initially presented on 4/5 for R fem-pop bypass, but developed SSCP shortly after arrival. He was noted to be tachycardic to the 130s in the setting of anxiety and holding his home beta blocker. His chest discomfort was alleviated with SLN and cardiology recommended resumption of his home beta blocker. His initial surgery was rescheduled to 4/8 and he presented this AM for surgery. Of note, he took his AM dose of metprolol 100mg  XL ~ 5AM.   Around 6PM this evening, the patient developed 8/10 squeezing SSCP that was radiating to his L arm. He was given 1 SLN with resolution of pain. HR at the time noted to be 101 with BP 151/80. EKG with some diffuse ST depressions. Cardiology was consulted for recommendations in this setting.   I arrived to see the patient shortly  after 8:30 PM. He was again complaining of recurrent CP radiating to his arm and appeared uncomfortable. BP 160s/80s and HR 115. I ordered 1 SLN as well as a nitro gtt and gave him 1 push of 5mg  IV metoprolol. With these interventions his pain subsided.  He states that even before his procedure he was having chronic stable angina with exertion. He did not feel that these episodes had been increasing in frequency or severity in the weeks prior to the procedure and were consistently relieved by SLN at home.     Past Medical History:  Diagnosis Date  . 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 Colon cancer ([place])   . Complication of anesthesia    atypical pseudo-cholinesterase deficiency  . Coronary artery disease involving native coronary artery of native heart with unstable angina pectoris ([place]) 01/10/2013    A) Mv CAD- CABGx 4 (LIMA-LAD, SVG-rPDA, SVG-OM1, SVG-OM2z);09/14/19: NSTEMI - Cath: p-mRCA, Ost-prox LAD, OM3 & SVG-OM1, SVG-OM2 100% CTO; Patentl LIMA-LAD. m-dLM 95% - p-mCx 90%, m-d Cx 90%. 2 Site DES PCI: LRad-RPDA - prox 95% (Resolute Onyx DES 2.75 x 15) , m-d 99% (Resolute Onyx 2.75 x 22); c) 09/16/19: Orbital Atherectomy LM-p-m LCx - Overlapping DES 2.5 x 34 ( LCx) -> 3 x 30 (LM-LCx)  . Diabetes mellitus without complication ([place])   . Family history of adverse reaction to anesthesia    sister also has atypical pseudo-cholinesterase deficiency  . GERD (gastroesophageal reflux disease)   . History of heart  bypass surgery   . Hypertension   . Myocardial infarction [facility])     Past Surgical History:  Procedure Laterality Date  . ABDOMINAL AORTOGRAM W/LOWER EXTREMITY Bilateral 02/18/2020   Procedure: ABDOMINAL AORTOGRAM W/LOWER EXTREMITY;  Surgeon: Marty Heck, MD;  Location: [place] [facility];  Service: Cardiovascular;  Laterality: Bilateral;  . COLONOSCOPY N/A 08/29/2018   Procedure: COLONOSCOPY;  Surgeon: Daneil Dolin, MD;  [facility];  Service: Endoscopy;   Laterality: N/A;  9:30  . CORONARY ARTERY BYPASS GRAFT     4 vessels  . CORONARY ATHERECTOMY N/A 09/15/2019   Procedure: CORONARY ATHERECTOMY;  Surgeon: Sherren Mocha, MD;  Location: [place] [facility];  Service: Cardiovascular;  LM-LCx Atherectomy - DES PCI (*Protected LM)   . CORONARY STENT INTERVENTION N/A 09/15/2019   Procedure: CORONARY STENT INTERVENTION;  Surgeon: Sherren Mocha, MD;  Location: [place] [facility];; Successful orbital atherectomy, PTCA, and overlapping DES treatment of severe calcific stenosis in the left main and left circumflex using a 2.5x34 mm Resolute Onyx DES (left circumflex) and 3.0x30 mm Resolute Onyx DES (overlapped proximally in the LCx extending into left main)  . CORONARY STENT INTERVENTION N/A 01/04/2020   Procedure: CORONARY STENT INTERVENTION;  Surgeon: Sherren Mocha, MD;  Location: [place] [facility];  Service: Cardiovascular;  Laterality: N/A;  . CORONARY/GRAFT ACUTE MI REVASCULARIZATION N/A 09/14/2019   Procedure: Coronary/Graft Acute MI Revascularization;  Surgeon: Sherren Mocha, MD;  Location: [place] [facility];  Service: Cardiovascular;  Laterality: N/A;  . heart bypass    . INSERTION OF MESH  02/07/2015   Procedure: INSERTION OF MESH;  Surgeon: Aviva Signs Md, MD;  Location: [facility];  Service: General;;  . INTRAVASCULAR ULTRASOUND/IVUS N/A 01/04/2020   Procedure: Intravascular Ultrasound/IVUS;  Surgeon: Sherren Mocha, MD;  Location: [place] [facility];  Service: Cardiovascular;  Laterality: N/A;  . LEFT HEART CATH AND CORONARY ANGIOGRAPHY N/A 09/14/2019   Procedure: LEFT HEART CATH AND CORONARY ANGIOGRAPHY;  Surgeon: Sherren Mocha, MD;  Location: [place] [facility];  Service: Cardiovascular;  Laterality: N/A;  . LEFT HEART CATH AND CORONARY ANGIOGRAPHY N/A 01/04/2020   Procedure: LEFT HEART CATH AND CORONARY ANGIOGRAPHY;  Surgeon: Sherren Mocha, MD;  Location: [place] [facility];  Service: Cardiovascular;  Laterality: N/A;  . PARTIAL  COLECTOMY N/A 09/17/2018   Procedure: PARTIAL COLECTOMY WITH PARTIAL WEDGE RESECTION LIVER METASTASIS;  Surgeon: Aviva Signs, MD;  Location: [facility];  Service: General;  Laterality: N/A;  . POLYPECTOMY  08/29/2018   Procedure: POLYPECTOMY;  Surgeon: Daneil Dolin, MD;  [facility];  Service: Endoscopy;;  . PORTACATH PLACEMENT Left 10/10/2018   Procedure: INSERTION PORT-A-CATH (catheter in left subclavian);  Surgeon: Aviva Signs, MD;  Location: [facility];  Service: General;  Laterality: Left;  . UMBILICAL HERNIA REPAIR N/A 02/07/2015   Procedure: UMBILICAL HERNIORRHAPHY WITH MESH;  Surgeon: Aviva Signs Md, MD;  Location: [facility];  Service: General;  Laterality: N/A;     Home Medications:  Prior to Admission medications   Medication Sig Start Date End Date Taking? Authorizing Provider  aspirin EC 81 MG tablet Take 81 mg by mouth daily.   Yes [provider]  Coenzyme Q10 (COQ10) 100 MG CAPS Take 100 mg by mouth daily.   Yes [provider]  diphenhydramine-acetaminophen (TYLENOL PM) 25-500 MG TABS tablet Take 2 tablets by mouth at bedtime.   Yes [provider]  FARXIGA 10 MG TABS tablet Take 10 mg by mouth daily.  12/19/18  Yes [provider]  gabapentin (NEURONTIN) 300 MG capsule Take 300 mg by mouth at bedtime.   Yes [provider]  hydrochlorothiazide (HYDRODIURIL) 25 MG tablet Take 1 tablet (25 mg total) by mouth daily. 09/24/19 02/25/20 Yes Bhagat, Bhavinkumar, PA  insulin aspart protamine- aspart (NOVOLOG MIX 70/30) (70-30) 100 UNIT/ML injection Inject 20 Units into the skin in the morning, at noon, and at bedtime.    Yes [provider]  Insulin Glargine (BASAGLAR KWIKPEN) 100 UNIT/ML SOPN Inject 40 Units into the skin at bedtime.  12/10/18  Yes [provider]  isosorbide mononitrate (IMDUR) 60 MG 24 hr tablet Take 1 tablet (60 mg total) by mouth daily. 02/09/20 05/09/20 Yes Weaver, Scott T, PA-C  JANUVIA 100 MG  tablet Take 100 mg by mouth daily.  12/19/18  Yes [provider]  metFORMIN (GLUCOPHAGE) 500 MG tablet Take 500 mg by mouth 2 (two) times daily. 08/27/19  Yes [provider]  nitroGLYCERIN (NITROSTAT) 0.4 MG SL tablet Place 1 tablet (0.4 mg total) under the tongue every 5 (five) minutes x 3 doses as needed for chest pain. 02/09/20  Yes Weaver, Scott T, PA-C  Omega-3 Fatty Acids (FISH OIL) 1000 MG CAPS Take 1,000 mg by mouth daily.    Yes [provider]  pantoprazole (PROTONIX) 40 MG tablet Take 40 mg by mouth daily.   Yes [provider]  prasugrel (EFFIENT) 10 MG TABS tablet Take 1 tablet (10 mg total) by mouth daily. 01/07/20  Yes Reino Bellis B, NP  ramipril (ALTACE) 10 MG capsule Take 10 mg by mouth daily.   Yes [provider]  rosuvastatin (CRESTOR) 40 MG tablet Take 1 tablet (40 mg total) by mouth at bedtime. 01/06/20 04/05/20 Yes Reino Bellis B, NP  amLODipine (NORVASC) 10 MG tablet TAKE ONE TABLET BY MOUTH ONCE DAILY. Patient not taking: Reported on 02/17/2020 03/23/19   Francene Finders L, NP-C  ezetimibe (ZETIA) 10 MG tablet Take 1 tablet (10 mg total) by mouth daily. Patient not taking: Reported on 02/17/2020 09/17/19   Reino Bellis B, NP  metoprolol succinate (TOPROL-XL) 100 MG 24 hr tablet Take 1 tablet (100 mg total) by mouth 2 (two) times daily. Take with or immediately following a meal. Patient not taking: Reported on 02/17/2020 09/16/19   Cheryln Manly, NP    Inpatient Medications: Scheduled Meds: . [START ON 02/26/2020] aspirin EC  81 mg Oral Daily  . [START ON 02/26/2020] docusate sodium  100 mg Oral Daily  . gabapentin  300 mg Oral QHS  . heparin  5,000 Units Subcutaneous Q8H  . hydrochlorothiazide  25 mg Oral Daily  . insulin aspart  0-15 Units Subcutaneous TID WC  . insulin aspart  0-5 Units Subcutaneous QHS  . insulin glargine  40 Units Subcutaneous QHS  . linagliptin  5 mg Oral Daily  . [START ON 02/26/2020] metFORMIN   500 mg Oral BID  . metoprolol tartrate  25 mg Oral Q6H  . [START ON 02/26/2020] pantoprazole  40 mg Oral Daily  . [START ON 02/26/2020] prasugrel  10 mg Oral Daily  . [START ON 02/26/2020] ramipril  10 mg Oral Daily  . rosuvastatin  40 mg Oral QHS   Continuous Infusions: . sodium chloride    . sodium chloride 50 mL/hr at 02/25/20 2200  . ceFAZolin    . magnesium sulfate bolus IVPB    . nitroGLYCERIN 15 mcg/min (02/25/20 2200)   PRN Meds: sodium chloride, acetaminophen **OR** acetaminophen,  alum & mag hydroxide-simeth, guaiFENesin-dextromethorphan, hydrALAZINE, labetalol, magnesium sulfate bolus IVPB, metoprolol tartrate, morphine injection, nitroGLYCERIN, ondansetron, oxyCODONE, phenol, potassium chloride, senna-docusate, zolpidem  Allergies:    Allergies  Allergen Reactions  . Anectine [Succinylcholine] Other (See Comments)    Atypical pseudocholinesterase deficiency.   . Nsaids Other (See Comments)    Contraindicated (can only have asa 81mg )    Social History:   Social History   Socioeconomic History  . Marital status: Married    Spouse name: Not on file  . Number of children: 3  . Years of education: Not on file  . Highest education level: Not on file  Occupational History  . Occupation: Programmer, systems: [employer]  Tobacco Use  . Smoking status: Never Smoker  . Smokeless tobacco: Never Used  Substance and Sexual Activity  . Alcohol use: No  . Drug use: No  . Sexual activity: Yes    Birth control/protection: None  Other Topics Concern  . Not on file  Social History Narrative  . Not on file   Social Determinants of Health   Financial Resource Strain:   . Difficulty of Paying Living Expenses:   Food Insecurity:   . Worried About Charity fundraiser in the Last Year:   . Arboriculturist in the Last Year:   Transportation Needs:   . Film/video editor (Medical):   Marland Kitchen Lack of Transportation (Non-Medical):   Physical Activity:   . Days  of Exercise per Week:   . Minutes of Exercise per Session:   Stress:   . Feeling of Stress :   Social Connections:   . Frequency of Communication with Friends and Family:   . Frequency of Social Gatherings with Friends and Family:   . Attends Religious Services:   . Active Member of Clubs or Organizations:   . Attends Archivist Meetings:   Marland Kitchen Marital Status:   Intimate Partner Violence:   . Fear of Current or Ex-Partner:   . Emotionally Abused:   Marland Kitchen Physically Abused:   . Sexually Abused:     Family History:    Family History  Problem Relation Age of Onset  . Hypertension Father   . Diabetes Father   . Heart disease Father   . Diabetes Sister   . Hypertension Sister   . Hypertension Brother   . Diabetes Brother   . Diabetes Sister   . Diabetes Brother   . Heart disease Brother   . Lymphoma Brother   . Hypertension Son      Review of Systems: [y] = yes, [ ]  = no     General: Weight gain [ ] ; Weight loss [ ] ; Anorexia [ ] ; Fatigue [ ] ; Fever [ ] ; Chills [ ] ; Weakness [ ]    Cardiac: Chest pain/pressure Blue.Reese ]; Resting SOB Blue.Reese ]; Exertional SOB [ ] ; Orthopnea [ ] ; Pedal Edema [ ] ; Palpitations [ ] ; Syncope [ ] ; Presyncope [ ] ; Paroxysmal nocturnal dyspnea[ ]    Pulmonary: Cough [ ] ; Wheezing[ ] ; Hemoptysis[ ] ; Sputum [ ] ; Snoring [ ]    GI: Vomiting[ ] ; Dysphagia[ ] ; Melena[ ] ; Hematochezia [ ] ; Heartburn[ ] ; Abdominal pain [ ] ; Constipation [ ] ; Diarrhea [ ] ; BRBPR [ ]    GU: Hematuria[ ] ; Dysuria [ ] ; Nocturia[ ]    Vascular: Pain in legs with walking [ ] ; Pain in feet with lying flat [ ] ; Non-healing sores [ ] ; Stroke [ ] ; TIA [ ] ; Slurred speech [ ] ;  Neuro: Headaches[ ] ; Vertigo[ ] ; Seizures[ ] ; Paresthesias[ ] ;Blurred vision [ ] ; Diplopia [ ] ; Vision changes [ ]    Ortho/Skin: Arthritis [ ] ; Joint pain [ ] ; Muscle pain [ ] ; Joint swelling [ ] ; Back Pain [ ] ; Rash [ ]    Psych: Depression[ ] ; Anxiety[ ]    Heme: Bleeding problems [ ] ; Clotting disorders [ ] ;  Anemia [ ]    Endocrine: Diabetes [ ] ; Thyroid dysfunction[ ]   Physical Exam/Data:   Vitals:   02/25/20 2146 02/25/20 2153 02/25/20 2200 02/25/20 2210  BP: 118/71 108/61 122/68 102/78  Pulse: (!) 105 (!) 103 (!) 103 (!) 102  Resp: 16 16 15 14   Temp:      TempSrc:      SpO2: 97% 97% 98% 96%  Weight:      Height:        Intake/Output Summary (Last 24 hours) at 02/25/2020 2218 Last data filed at 02/25/2020 2200 Gross per 24 hour  Intake 2966.89 ml  Output 1695 ml  Net 1271.89 ml   Filed Weights   02/25/20 0605 02/25/20 1419  Weight: 84 kg 84 kg   Body mass index is 31.79 kg/m.  General:  Well nourished, well developed, in mild distress.  HEENT: normal Neck: no JVD Cardiac:  normal S1, S2; RRR; no murmur  Lungs:  clear to auscultation bilaterally, no wheezing, rhonchi or rales  Abd: soft, nontender, no hepatomegaly  Ext: no edema Musculoskeletal:  No deformities, BUE and BLE strength normal and equal Skin: warm and dry  Neuro:  CNs 2-12 intact, no focal abnormalities noted Psych:  Normal affect   EKG:  The EKG was personally reviewed and demonstrates diffuse ST depressions with HR low 100s.  Telemetry:  Telemetry was personally reviewed and demonstrates SR/ST 70s-110s.   Relevant CV Studies: Echocardiogram 01/04/2020: IMPRESSIONS    1. Left ventricular ejection fraction, by estimation, is 45 to 50%. The  left ventricle has mildly decreased function. The left ventricle  demonstrates global hypokinesis. There is mild concentric left ventricular  hypertrophy. Left ventricular diastolic  parameters are consistent with Grade II diastolic dysfunction  (pseudonormalization). Elevated left atrial pressure.  2. Right ventricular systolic function is normal. The right ventricular  size is normal. Tricuspid regurgitation signal is inadequate for assessing  PA pressure.  3. Left atrial size was mild to moderately dilated.  4. The mitral valve is degenerative. Mild mitral  valve regurgitation.  5. The aortic valve is tricuspid. Aortic valve regurgitation is not  visualized. Mild aortic valve stenosis.  6. The inferior vena cava is dilated in size with >50% respiratory  variability, suggesting right atrial pressure of 8 mmHg.   Comparison(s): A prior study was performed on 09/14/2019. Prior images  reviewed side by side. Changes from prior study are noted. The left  ventricular function is worsened.  Cardiac catheterization 01/04/2020: Conclusion  1. Severe multivessel coronary artery disease with chronic total occlusion of the LAD and RCA, continued patency of the left mainstem and left circumflex stents with focal mild to moderate in-stent restenosis at the transition of the left main into the circumflex. 2. Status post aortocoronary bypass surgery with continued patency of the LIMA to LAD graft and severe aorto ostial stenosis of the radial graft to PDA treated successfully with PCI using a 3.0 x 30 mm resolute Onyx DES with intravascular ultrasound guidance 3. Nonobstructive ostial left circumflex restenosis as demonstrated by intravascular ultrasound with a minimal lumen area of 5.96 mm  Recommendations: Long-term dual antiplatelet therapy  with      Laboratory Data:  Chemistry Recent Labs  Lab 02/22/20 [phone] 02/22/20 0635 02/22/20 1511 02/23/20 0320 02/25/20 1429  NA 137  --   --  137  --   K 3.4*  --   --  3.5  --   CL 97*  --   --  99  --   CO2 22  --   --  24  --   GLUCOSE 389*  --   --  286*  --   BUN 39*  --   --  37*  --   CREATININE 1.34*   < > 1.14 0.94 1.33*  CALCIUM 9.7  --   --  9.3  --   GFRNONAA 56*   < > >60 >60 57*  GFRAA >60   < > >60 >60 >60  ANIONGAP 18*  --   --  14  --    < > = values in this interval not displayed.    Recent Labs  Lab 02/22/20 0635  PROT 7.1  ALBUMIN 3.8  AST 20  ALT 29  ALKPHOS 86  BILITOT 0.4   Hematology Recent Labs  Lab 02/22/20 0635 02/22/20 1511 02/25/20 1429  WBC 11.2*  9.0 14.9*  RBC 4.61 4.33 3.91*  HGB 13.2 12.1* 11.1*  HCT 39.6 36.7* 34.0*  MCV 85.9 84.8 87.0  MCH 28.6 27.9 28.4  MCHC 33.3 33.0 32.6  RDW 13.4 13.5 13.4  PLT 262 254 260   Cardiac EnzymesNo results for input(s): TROPONINI in the last 168 hours. No results for input(s): TROPIPOC in the last 168 hours.  BNPNo results for input(s): BNP, PROBNP in the last 168 hours.  DDimer No results for input(s): DDIMER in the last 168 hours.  Radiology/Studies:  No results found.  Assessment and Plan:   1. Unstable angina: Patient with resting chest discomfort and ischemic EKG changes.  He has known CAD with 2 recent heart catheterizations and PCI procedures for ACS.  I think today's episode is likely precipitated by tachycardia from beta blocker withdrawal and post-operative stress. I have given him 5mg  of IV metoprolol and will start him on 50mg  q6 hours (the equivalent of his home dose). He can be transitioned back to his home dose (metoprolol 100mg  XL BID) when surgery is comfortable. We will cycle his troponins to get a sense of absolute value, but these may be difficult to interpret in the immediate post-operative setting - I expect they will be abnormal. He is currently pain free.  I have held his Imdur while on a nitro drip. Once his HR is controlled (goal HR < 80), would transition off of nitro gtt and back to Imdur.  2. HTN: He is on ramipril and we have IV nitroglycerin on board. Goal SBP < 160.   Update: hsTn 16-> 17. No recurrent CP overnight on nitro gt and metoprolol 50mg  q6 hours.    For questions or updates, please contact [place] Please consult [email or web address] for contact info under    Signed, Milus Banister, MD  02/25/2020 10:18 PM

## 2020-02-25 NOTE — Progress Notes (Signed)
  Day of Surgery Note    Subjective:  In PACU. To 4E in the next few minutes.  He says pain is controlled.   Vitals:   02/25/20 1305 02/25/20 1335  BP: (!) 106/53 (!) 114/58  Pulse: 78 79  Resp: 19   Temp:  97.8 F (36.6 C)  SpO2: 97% 98%    Incisions:   Left groin and thigh incisions x 2 all well approximated and without bleeding or hematoma. Extremities:  Feet well perfused. Right DP, PT, peroneal doppler pulses obtained (PT dominant). + left Doppler pulses. Right foot slightly hyperemic Cardiac:  RRR Lungs:  nonlabored       Assessment/Plan:  This is a 64 y.o. male who is s/p  Left fem-pop bypass with reverse GSV.  Stable post-op   Risa Grill, PA-C 02/25/2020 1:46 PM [phone]

## 2020-02-25 NOTE — Anesthesia Preprocedure Evaluation (Addendum)
Anesthesia Evaluation  Patient identified by MRN, date of birth, ID band Patient awake    Reviewed: Allergy & Precautions, NPO status , Patient's Chart, lab work & pertinent test results  History of Anesthesia Complications (+) Family history of anesthesia reaction and history of anesthetic complications (sister also has atypical pseudo-cholinesterase deficiency)  Airway Mallampati: II  TM Distance: >3 FB Neck ROM: Full    Dental  (+) Teeth Intact, Dental Advisory Given   Pulmonary sleep apnea ,    Pulmonary exam normal breath sounds clear to auscultation       Cardiovascular hypertension, Pt. on medications and Pt. on home beta blockers + angina + CAD, + Past MI, + Cardiac Stents, + CABG and + Peripheral Vascular Disease  Normal cardiovascular exam Rhythm:Regular Rate:Normal  Echo 01/04/20: 1. Left ventricular ejection fraction, by estimation, is 45 to 50%. The  left ventricle has mildly decreased function. The left ventricle  demonstrates global hypokinesis. There is mild concentric left ventricular  hypertrophy. Left ventricular diastolic  parameters are consistent with Grade II diastolic dysfunction  (pseudonormalization). Elevated left atrial pressure.  2. Right ventricular systolic function is normal. The right ventricular  size is normal. Tricuspid regurgitation signal is inadequate for assessing  PA pressure.  3. Left atrial size was mild to moderately dilated.  4. The mitral valve is degenerative. Mild mitral valve regurgitation.  5. The aortic valve is tricuspid. Aortic valve regurgitation is not  visualized. Mild aortic valve stenosis.  6. The inferior vena cava is dilated in size with >50% respiratory  variability, suggesting right atrial pressure of 8 mmHg.    Neuro/Psych negative neurological ROS  negative psych ROS   GI/Hepatic Neg liver ROS, GERD  ,Liver ca s/p chemo Colon ca    Endo/Other   diabetes, Type 2, Oral Hypoglycemic Agents, Insulin DependentObesity   Renal/GU negative Renal ROS     Musculoskeletal negative musculoskeletal ROS (+)   Abdominal   Peds  Hematology  (+) Blood dyscrasia ([place]), anemia ,   Anesthesia Other Findings Day of surgery medications reviewed with the patient.  Reproductive/Obstetrics                            Anesthesia Physical Anesthesia Plan  ASA: IV  Anesthesia Plan: General   Post-op Pain Management:    Induction: Intravenous  PONV Risk Score and Plan: 3 and Midazolam, Dexamethasone and Ondansetron  Airway Management Planned: Oral ETT  Additional Equipment:   Intra-op Plan:   Post-operative Plan: Extubation in OR  Informed Consent: I have reviewed the patients History and Physical, chart, labs and discussed the procedure including the risks, benefits and alternatives for the proposed anesthesia with the patient or authorized representative who has indicated his/her understanding and acceptance.     Dental advisory given and Dental Advisory Given  Plan Discussed with: CRNA  Anesthesia Plan Comments: (Consider A-line after induction. Will see how he tolerates induction.)       Anesthesia Quick Evaluation

## 2020-02-25 NOTE — Progress Notes (Signed)
Pt received from PACU. VSS. Pt oriented to room and unit. Pulses dopplerable and incisions clean dry and intact. Call light in reach.  Clyde Canterbury, RN

## 2020-02-26 ENCOUNTER — Encounter: Payer: Self-pay | Admitting: *Deleted

## 2020-02-26 ENCOUNTER — Inpatient Hospital Stay (HOSPITAL_COMMUNITY): Payer: Federal, State, Local not specified - PPO

## 2020-02-26 ENCOUNTER — Other Ambulatory Visit: Payer: Self-pay

## 2020-02-26 DIAGNOSIS — I25110 Atherosclerotic heart disease of native coronary artery with unstable angina pectoris: Secondary | ICD-10-CM | POA: Diagnosis not present

## 2020-02-26 DIAGNOSIS — L0390 Cellulitis, unspecified: Secondary | ICD-10-CM | POA: Diagnosis not present

## 2020-02-26 LAB — GLUCOSE, CAPILLARY
Glucose-Capillary: 257 mg/dL — ABNORMAL HIGH (ref 70–99)
Glucose-Capillary: 290 mg/dL — ABNORMAL HIGH (ref 70–99)
Glucose-Capillary: 306 mg/dL — ABNORMAL HIGH (ref 70–99)
Glucose-Capillary: 245 mg/dL — ABNORMAL HIGH (ref 70–99)

## 2020-02-26 LAB — TROPONIN I (HIGH SENSITIVITY): Troponin I (High Sensitivity): 17 ng/L (ref <18)

## 2020-02-26 MED ORDER — KCL IN DEXTROSE-NACL 10-5-0.45 MEQ/L-%-% IV SOLN
INTRAVENOUS | Status: DC
  Filled 2020-02-26: qty 1000

## 2020-02-26 MED ORDER — METOPROLOL SUCCINATE ER 100 MG PO TB24
100.0000 mg | ORAL_TABLET | Freq: Two times a day (BID) | ORAL | Status: DC
  Administered 2020-02-26 – 2020-02-28 (×5): 100 mg via ORAL
  Filled 2020-02-26 (×4): qty 1

## 2020-02-26 MED ORDER — SODIUM CHLORIDE 0.45 % IV SOLN: INTRAVENOUS | Status: DC

## 2020-02-26 MED ORDER — ISOSORBIDE MONONITRATE ER 60 MG PO TB24
60.0000 mg | ORAL_TABLET | Freq: Every day | ORAL | Status: DC
  Administered 2020-02-26 – 2020-02-28 (×3): 60 mg via ORAL
  Filled 2020-02-26 (×3): qty 1

## 2020-02-26 NOTE — Progress Notes (Signed)
Pt experienced chest pain of 9 in the scale from 0-10. It was the same chest pain he experienced yesterday and last night per pt. VS obtained. Placed pt on 2L nasal canula for comfort. Administered oxycondone 5mg , nitro 1 tabletx2. EKG obtained and placed in pt's chart. Pt stated "0" for chest pain in the scale of 0-10.   Lavenia Atlas, RN

## 2020-02-26 NOTE — Progress Notes (Addendum)
Progress Note    02/26/2020 7:21 AM 1 Day Post-Op  Subjective:  Developed CP last night and cardiology evaluated>>unstable angina.  He was placed on nitroglycerin infusion well as metoprolol 50 mg every 6 hours.  Comfortable this morning without CP, SOB.  Postoperative pain controlled. Has just been removed and he has not yet voided spontaneously.  He is tolerating his diet  Vitals:   02/26/20 0330 02/26/20 0400  BP: 124/68 125/64  Pulse: 96 94  Resp: 12 16  Temp: 98.3 F (36.8 C)   SpO2: 97% 98%    Physical Exam: Cardiac: Heart rate and rhythm are regular Lungs: There to auscultation bilaterally Incisions: Right groin and right thigh incisions x2 are well approximated without bleeding or hematoma Extremities: Both feet are warm and well perfused with active range of motion.  Sensation intact.  Doppler pulses of the right DP, PT and peroneal. Abdomen: Soft, nondistended  CBC    Component Value Date/Time   WBC 10.8 (H) 02/25/2020 2237   RBC 3.59 (L) 02/25/2020 2237   HGB 10.3 (L) 02/25/2020 2237   HCT 31.7 (L) 02/25/2020 2237   PLT 238 02/25/2020 2237   MCV 88.3 02/25/2020 2237   MCH 28.7 02/25/2020 2237   MCHC 32.5 02/25/2020 2237   RDW 13.7 02/25/2020 2237   LYMPHSABS 4.6 (H) 01/04/2020 0120   MONOABS 1.2 (H) 01/04/2020 0120   EOSABS 0.1 01/04/2020 0120   BASOSABS 0.1 01/04/2020 0120    BMET    Component Value Date/Time   NA 136 02/25/2020 2237   K 4.6 02/25/2020 2237   CL 101 02/25/2020 2237   CO2 20 (L) 02/25/2020 2237   GLUCOSE 402 (H) 02/25/2020 2237   BUN 37 (H) 02/25/2020 2237   CREATININE 1.58 (H) 02/25/2020 2237   CALCIUM 8.6 (L) 02/25/2020 2237   GFRNONAA 46 (L) 02/25/2020 2237   GFRAA 54 (L) 02/25/2020 2237     Intake/Output Summary (Last 24 hours) at 02/26/2020 0721 Last data filed at 02/26/2020 0615 Gross per 24 hour  Intake 3830.27 ml  Output 4095 ml  Net -264.73 ml    HOSPITAL MEDICATIONS Scheduled Meds: . aspirin EC  81 mg Oral Daily   . docusate sodium  100 mg Oral Daily  . gabapentin  300 mg Oral QHS  . heparin  5,000 Units Subcutaneous Q8H  . hydrochlorothiazide  25 mg Oral Daily  . insulin aspart  0-15 Units Subcutaneous TID WC  . insulin aspart  0-5 Units Subcutaneous QHS  . insulin glargine  40 Units Subcutaneous QHS  . linagliptin  5 mg Oral Daily  . metFORMIN  500 mg Oral BID  . metoprolol tartrate  50 mg Oral Q6H  . pantoprazole  40 mg Oral Daily  . prasugrel  10 mg Oral Daily  . ramipril  10 mg Oral Daily  . rosuvastatin  40 mg Oral QHS   Continuous Infusions: . sodium chloride    . sodium chloride 50 mL/hr at 02/26/20 0400  . magnesium sulfate bolus IVPB    . nitroGLYCERIN 45 mcg/min (02/26/20 0400)   PRN Meds:.sodium chloride, acetaminophen **OR** acetaminophen, alum & mag hydroxide-simeth, guaiFENesin-dextromethorphan, hydrALAZINE, labetalol, magnesium sulfate bolus IVPB, metoprolol tartrate, morphine injection, nitroGLYCERIN, ondansetron, oxyCODONE, phenol, potassium chloride, senna-docusate, zolpidem  Assessment:  64 y.o. male is s/p:  Right femoral to popliteal artery bypass with reverse saphenous vein grafting.  Hemoglobin is 10.7.  Creatinine has risen to 1.58.  Continue hydration. Development of chest pain, EKG changes.  Cardiology has  evaluated and treating him for unstable angina.  Pain has resolved. 1 Day Post-Op  Plan: -Continue observation, ambulation.  Appreciate cardiology evaluating and treatment recommendations.  Repeat a.m. labs -DVT prophylaxis: heaprin [place]   Risa Grill, PA-C [facility] [phone] 02/26/2020  7:21 AM   I have examined the patient, reviewed and agree with above.  Appreciate cardiology's help.  No further chest pain.  2+ right popliteal pulse.  Foot well perfused.  Erythema in foot has completely resolved.  Dry gangrenous changes on the tips of his great and third toe are stable.  Has been up walking in room.  Has voided with Foley.  We  will continue to mobilize today.  Will be stable for discharge tomorrow if no further cardiac issues.  Curt Jews, MD 02/26/2020 9:35 AM

## 2020-02-26 NOTE — Progress Notes (Signed)
Progress Note  Patient Name: Ian [name] Date of Encounter: 02/26/2020  Primary Cardiologist: Sherren Mocha, MD   Subjective   No further chest pain.  Events of yesterday noted with chest discomfort after surgery.  Doing well this morning.  No dyspnea at rest.  Inpatient Medications    Scheduled Meds: . aspirin EC  81 mg Oral Daily  . docusate sodium  100 mg Oral Daily  . gabapentin  300 mg Oral QHS  . heparin  5,000 Units Subcutaneous Q8H  . hydrochlorothiazide  25 mg Oral Daily  . insulin aspart  0-15 Units Subcutaneous TID WC  . insulin aspart  0-5 Units Subcutaneous QHS  . insulin glargine  40 Units Subcutaneous QHS  . linagliptin  5 mg Oral Daily  . metFORMIN  500 mg Oral BID  . metoprolol tartrate  50 mg Oral Q6H  . pantoprazole  40 mg Oral Daily  . prasugrel  10 mg Oral Daily  . ramipril  10 mg Oral Daily  . rosuvastatin  40 mg Oral QHS   Continuous Infusions: . sodium chloride 125 mL/hr at 02/26/20 0829  . sodium chloride    . sodium chloride 50 mL/hr at 02/26/20 0400  . magnesium sulfate bolus IVPB    . nitroGLYCERIN 45 mcg/min (02/26/20 0400)   PRN Meds: sodium chloride, acetaminophen **OR** acetaminophen, alum & mag hydroxide-simeth, guaiFENesin-dextromethorphan, hydrALAZINE, labetalol, magnesium sulfate bolus IVPB, metoprolol tartrate, morphine injection, nitroGLYCERIN, ondansetron, oxyCODONE, phenol, potassium chloride, senna-docusate, zolpidem   Vital Signs    Vitals:   02/26/20 0330 02/26/20 0400 02/26/20 0754 02/26/20 1106  BP: 124/68 125/64 120/65 (!) 102/51  Pulse: 96 94 94 87  Resp: 12 16 16 14   Temp: 98.3 F (36.8 C)  98.5 F (36.9 C) 98.3 F (36.8 C)  TempSrc: Oral  Oral Oral  SpO2: 97% 98% 98% 99%  Weight:      Height:        Intake/Output Summary (Last 24 hours) at 02/26/2020 1241 Last data filed at 02/26/2020 0900 Gross per 24 hour  Intake 1986.33 ml  Output 3125 ml  Net -1138.67 ml   Last 3 Weights 02/25/2020 02/25/2020 02/23/2020   Weight (lbs) 185 lb 3 oz 185 lb 3 oz 184 lb 11.2 oz  Weight (kg) 84 kg 84 kg 83.779 kg      Telemetry    Sinus rhythm - Personally Reviewed   Physical Exam  Alert, oriented male in no distress GEN: No acute distress.   Neck: No JVD Cardiac: RRR, no murmurs, rubs, or gallops.  Respiratory: Clear to auscultation bilaterally. GI: Soft, nontender, non-distended  MS: No edema; No deformity.  Dry ulceration on the right foot unchanged. Neuro:  Nonfocal  Psych: Normal affect   Labs    High Sensitivity Troponin:   Recent Labs  Lab 02/22/20 0938 02/22/20 1511 02/25/20 2237 02/26/20 0009  TROPONINIHS 61* 63* 16 17      Chemistry Recent Labs  Lab 02/22/20 0635 02/22/20 1511 02/23/20 0320 02/25/20 1429 02/25/20 2237  NA 137  --  137  --  136  K 3.4*  --  3.5  --  4.6  CL 97*  --  99  --  101  CO2 22  --  24  --  20*  GLUCOSE 389*  --  286*  --  402*  BUN 39*  --  37*  --  37*  CREATININE 1.34*   < > 0.94 1.33* 1.58*  CALCIUM 9.7  --  9.3  --  8.6*  PROT 7.1  --   --   --   --   ALBUMIN 3.8  --   --   --   --   AST 20  --   --   --   --   ALT 29  --   --   --   --   ALKPHOS 86  --   --   --   --   BILITOT 0.4  --   --   --   --   GFRNONAA 56*   < > >60 57* 46*  GFRAA >60   < > >60 >60 54*  ANIONGAP 18*  --  14  --  15   < > = values in this interval not displayed.     Hematology Recent Labs  Lab 02/22/20 1511 02/25/20 1429 02/25/20 2237  WBC 9.0 14.9* 10.8*  RBC 4.33 3.91* 3.59*  HGB 12.1* 11.1* 10.3*  HCT 36.7* 34.0* 31.7*  MCV 84.8 87.0 88.3  MCH 27.9 28.4 28.7  MCHC 33.0 32.6 32.5  RDW 13.5 13.4 13.7  PLT 254 260 238    BNPNo results for input(s): BNP, PROBNP in the last 168 hours.   DDimer No results for input(s): DDIMER in the last 168 hours.   Radiology    No results found.   Patient Profile     64 y.o. male with known extensive CAD and critical limb ischemia who underwent right femoral-popliteal bypass surgery 02/25/2020, developing  postoperative chest discomfort consistent with unstable angina  Assessment & Plan    Unstable angina pectoris: The patient has improved and is now chest pain-free.  Troponins are negative.  Now that he is taking p.o. medicine will consolidate his beta-blocker back to metoprolol succinate 100 mg twice daily.  Will transition him off of IV nitroglycerin to oral isosorbide.  Appears to be progressing well.  Appreciate Dr Luther Parody care.  The patient states his right foot already feels better.      For questions or updates, please contact [place] Please consult [email or web address] for contact info under        Signed, Sherren Mocha, MD  02/26/2020, 12:41 PM

## 2020-02-26 NOTE — Evaluation (Signed)
Occupational Therapy Evaluation Patient Details Name: Ian [name] MRN: [id number] DOB: [birth year] Today's Date: 02/26/2020    History of Present Illness Pt is 64 yo male with hx of extensive CAD and critical limb ischemia of R leg who was admitted for planned femoropopliteal bypass on 02/25/20.  PMH includes CABG, STEMI 10/20, metastatic colon CA (partial colectomy and wedge resection of liver), DM, HTN, peripheral neuropathy, GERD.  Pt developed chest pain postoperatively - troponin is level, treated with nitro drip.   Clinical Impression   PTA pt living independently with spouse. At time of eval, pt standing up in room ready to use bathroom. Pt completed toilet transfer with supervision level of assist for BM. Had episode of nausea without emesis, informed RN. Pt demonstrated ability to complete BADL at mod I level without need of external assist. Educated on shower chair and ECS strategies, no further questions from pt or wife. No further OT needs identified for pt is very close to functional baseline. No further OT needs identified, OT will sign off.    Follow Up Recommendations  No OT follow up    Equipment Recommendations  None recommended by OT;Other (comment)(educated on shower chair, pt and wife politely declined)    Recommendations for Other Services       Precautions / Restrictions Precautions Precautions: None Restrictions Weight Bearing Restrictions: No      Mobility Bed Mobility               General bed mobility comments: standing up in room  Transfers Overall transfer level: Needs assistance Equipment used: None Transfers: Sit to/from Stand Sit to Stand: Supervision              Balance Overall balance assessment: Independent                                         ADL either performed or assessed with clinical judgement   ADL Overall ADL's : Modified independent;At baseline                                        General ADL Comments: Pt demonstrates ability to complete BADL at mod I level. Pt demonstrated toilet transfer, LB dressing, and functional mobility without external assist. Reviewed ECS and DME for home as needed, but pt and wife politely decline at this time.     Vision Patient Visual Report: No change from baseline       Perception     Praxis      Pertinent Vitals/Pain Pain Assessment: No/denies pain     Hand Dominance     Extremity/Trunk Assessment Upper Extremity Assessment Upper Extremity Assessment: Overall WFL for tasks assessed   Lower Extremity Assessment Lower Extremity Assessment: Defer to PT evaluation       Communication Communication Communication: No difficulties   Cognition Arousal/Alertness: Awake/alert Behavior During Therapy: WFL for tasks assessed/performed Overall Cognitive Status: Within Functional Limits for tasks assessed                                     General Comments       Exercises     Shoulder Instructions      Home Living Family/patient expects to  be discharged to:: Private residence Living Arrangements: Spouse/significant other Available Help at Discharge: Family;Available 24 hours/day Type of Home: House Home Access: Stairs to enter [employer] of Steps: 3 Entrance Stairs-Rails: Right Home Layout: One level;Laundry or work area in basement     [employer] Shower/Tub: Occupational psychologist: None          Prior Functioning/Environment Level of Independence: Independent                 OT Problem List: Decreased knowledge of use of DME or AE;Decreased knowledge of precautions      OT Treatment/Interventions:      OT Goals(Current goals can be found in the care plan section) Acute Rehab OT Goals Patient Stated Goal: return home OT Goal Formulation: With patient Time For Goal Achievement: 03/11/20 Potential to Achieve Goals: Good   OT Frequency:     Barriers to D/C:            Co-evaluation              AM-PAC OT "6 Clicks" Daily Activity     Outcome Measure Help from another person eating meals?: None Help from another person taking care of personal grooming?: None Help from another person toileting, which includes using toliet, bedpan, or urinal?: None Help from another person bathing (including washing, rinsing, drying)?: None Help from another person to put on and taking off regular upper body clothing?: None Help from another person to put on and taking off regular lower body clothing?: None 6 Click Score: 24   End of Session Equipment Utilized During Treatment: Oxygen Nurse Communication: Mobility status  Activity Tolerance: Patient tolerated treatment well Patient left: in bed;with family/visitor present;Other (comment)(sitting EOB)  OT Visit Diagnosis: Unsteadiness on feet (R26.81)                Time: 1610-9604 OT Time Calculation (min): 20 min Charges:  OT General Charges $OT Visit: 1 Visit OT Evaluation $OT Eval Low Complexity: 1 Low  Zenovia Jarred, MSOT, OTR/L Acute Rehabilitation Services [facility] Office Number: [phone] Pager: [phone]  Zenovia Jarred 02/26/2020, 6:03 PM

## 2020-02-26 NOTE — Progress Notes (Signed)
Inpatient Diabetes Program Recommendations  AACE/ADA: New Consensus Statement on Inpatient Glycemic Control (2015)  Target Ranges:  Prepandial:   less than 140 mg/dL      Peak postprandial:   less than 180 mg/dL (1-2 hours)      Critically ill patients:  140 - 180 mg/dL   Lab Results  Component Value Date   GLUCAP 257 (H) 02/26/2020   HGBA1C 9.8 (H) 01/04/2020    Review of Glycemic Control Results for Ian [name], Ian [name] (MRN [id number]) as of 02/26/2020 09:24  Ref. Range 02/25/2020 08:34 02/25/2020 11:20 02/25/2020 14:21 02/25/2020 16:05 02/25/2020 21:08 02/26/2020 06:12  Glucose-Capillary Latest Ref Range: 70 - 99 mg/dL 123 (H) 172 (H) 192 (H) 278 (H) 418 (H) 257 (H)   Diabetes history: DM 2 Outpatient Diabetes medications: Basaglar 40 units qhs, 70/30 20 units tid, Farxiga 10 mg Daily, Januvia 100 mg Daily Current orders for Inpatient glycemic control:  Lantus 40 units qhs Novolog 0-15 units tid + hs Tradjenta 5 mg Daily Metformin 500 mg bid  Inpatient Diabetes Program Recommendations:    Pt received decadron 5 mg yesterday. Glucose trends increased.  Pt takes meal coverage at home. Consider ordering Novolog 6 units tid meal coverage in addition to correction scale if pt is eating >50% of meals.  Thanks,  Tama Headings RN, MSN, BC-ADM Inpatient Diabetes Coordinator Team Pager [phone] (8a-5p)

## 2020-02-26 NOTE — Anesthesia Postprocedure Evaluation (Signed)
Anesthesia Post Note  Patient: Ian [name]  Procedure(s) Performed: [place] ARTERY BYPASS GRAFT with Saphenous vein (Right Leg Upper)     Patient location during evaluation: PACU Anesthesia Type: General Level of consciousness: awake and alert Pain management: pain level controlled Vital Signs Assessment: post-procedure vital signs reviewed and stable Respiratory status: spontaneous breathing, nonlabored ventilation and respiratory function stable Cardiovascular status: blood pressure returned to baseline and stable Postop Assessment: no apparent nausea or vomiting Anesthetic complications: no    Last Vitals:  Vitals:   02/26/20 0330 02/26/20 0400  BP: 124/68 125/64  Pulse: 96 94  Resp: 12 16  Temp: 36.8 C   SpO2: 97% 98%    Last Pain:  Vitals:   02/26/20 0330  TempSrc: Oral  PainSc:                  Catalina Gravel

## 2020-02-26 NOTE — Progress Notes (Signed)
ABI's have been completed. Preliminary results can be found in CV Proc through chart review.   02/26/20 2:48 PM Ian [name] RVT

## 2020-02-26 NOTE — Evaluation (Signed)
Physical Therapy Evaluation Patient Details Name: Ian [name] MRN: [id number] DOB: 02-06-1956 Today's Date: 02/26/2020   History of Present Illness  Pt is 64 yo male with hx of extensive CAD and critical limb ischemia of R leg who was admitted for planned femoropopliteal bypass on 02/25/20.  PMH includes CABG, STEMI 10/20, metastatic colon CA (partial colectomy and wedge resection of liver), DM, HTN, peripheral neuropathy, GERD.  Pt developed chest pain postoperatively - troponin is level, treated with nitro drip.  Clinical Impression  Pt admitted with above diagnosis. Pt presenting with mild decrease in mobility and endurance that will benefit from PT to progress.  He was educated on transfer techniques, HEP, posture, and soft tissue mobilization when able.  Pt ambulated 300' without AD but did fatigue and has stairs to enter home.  Pt currently with functional limitations due to the deficits listed below (see PT Problem List). Pt will benefit from skilled PT to increase their independence and safety with mobility to allow discharge to the venue listed below.       Follow Up Recommendations No PT follow up    Equipment Recommendations  None recommended by PT    Recommendations for Other Services       Precautions / Restrictions Precautions Precautions: None Restrictions Weight Bearing Restrictions: No      Mobility  Bed Mobility               General bed mobility comments: Pt up in chair at arrival.  Pt was able to get up with assist (supervision) from wife  Transfers Overall transfer level: Needs assistance   Transfers: Sit to/from Stand Sit to Stand: Supervision            Ambulation/Gait Ambulation/Gait assistance: Supervision Gait Distance (Feet): 300 Feet Assistive device: None Gait Pattern/deviations: Decreased stance time - right;Decreased stride length;Antalgic     General Gait Details: mild antalgic gait; decreased gait speed last 50' - reports  fatigue  Stairs Stairs: (Did not perform due to fatigue; however, discussed sequencing and use of rail)          Wheelchair Mobility    Modified Rankin (Stroke Patients Only)       Balance Overall balance assessment: Independent                                           Pertinent Vitals/Pain Pain Assessment: No/denies pain    Home Living Family/patient expects to be discharged to:: Private residence Living Arrangements: Spouse/significant other Available Help at Discharge: Family;Available 24 hours/day Type of Home: House Home Access: Stairs to enter Entrance Stairs-Rails: Right Entrance Stairs-Number of Steps: 3 Home Layout: One level;Laundry or work area in [employer]: None      Prior Function Level of Independence: Independent               Journalist, newspaper        Extremity/Trunk Assessment   Upper Extremity Assessment Upper Extremity Assessment: Overall WFL for tasks assessed    Lower Extremity Assessment Lower Extremity Assessment: LLE deficits/detail;RLE deficits/detail RLE Deficits / Details: ROM WFL (does report pain with hip flex); demonstrates at least 3/5 strength but not further tested LLE Deficits / Details: ROM WFL; Demonstrates at least 3/5 strength but not further tested; pt does report similar vascular issues with L LE that is followed by surgeon - reports more pain in  L LE with ambulation than R LE       Communication      Cognition Arousal/Alertness: Awake/alert Behavior During Therapy: WFL for tasks assessed/performed Overall Cognitive Status: Within Functional Limits for tasks assessed                                        General Comments General comments (skin integrity, edema, etc.): All VSS and no episodes of chest pain.  Educated on gradual increase in activity, elevation of legs, and stair sequencing.  Also discussed HEP including AROM of LE as able and beginning gentle soft  tissue mobilization incisions and surrounding sites after 1-2 weeks    Exercises     Assessment/Plan    PT Assessment Patient needs continued PT services  PT Problem List Decreased mobility;Decreased strength;Decreased range of motion;Decreased activity tolerance;Cardiopulmonary status limiting activity;Decreased balance;Decreased knowledge of use of DME       PT Treatment Interventions DME instruction;Therapeutic activities;Gait training;Therapeutic exercise;Patient/family education;Stair training;Functional mobility training    PT Goals (Current goals can be found in the Care Plan section)  Acute Rehab PT Goals Patient Stated Goal: return home PT Goal Formulation: With patient Time For Goal Achievement: 03/11/20 Potential to Achieve Goals: Good    Frequency Min 3X/week   Barriers to discharge        Co-evaluation               AM-PAC PT "6 Clicks" Mobility  Outcome Measure Help needed turning from your back to your side while in a flat bed without using bedrails?: None Help needed moving from lying on your back to sitting on the side of a flat bed without using bedrails?: None Help needed moving to and from a bed to a chair (including a wheelchair)?: None Help needed standing up from a chair using your arms (e.g., wheelchair or bedside chair)?: None Help needed to walk in hospital room?: None Help needed climbing 3-5 steps with a railing? : A Little 6 Click Score: 23    End of Session   Activity Tolerance: Patient tolerated treatment well Patient left: in chair;with call bell/phone within reach;with family/visitor present Nurse Communication: Mobility status PT Visit Diagnosis: Other abnormalities of gait and mobility (R26.89)    Time: 9983-3825 PT Time Calculation (min) (ACUTE ONLY): 30 min   Charges:   PT Evaluation $PT Eval Low Complexity: 1 Low PT Treatments $Therapeutic Activity: 8-22 mins        Maggie Font, PT Acute Rehab Services Pager  [phone] [facility] [phone] [facility] 02/26/2020, 11:17 AM

## 2020-02-27 DIAGNOSIS — I739 Peripheral vascular disease, unspecified: Secondary | ICD-10-CM | POA: Diagnosis not present

## 2020-02-27 DIAGNOSIS — I2510 Atherosclerotic heart disease of native coronary artery without angina pectoris: Secondary | ICD-10-CM | POA: Diagnosis not present

## 2020-02-27 DIAGNOSIS — I480 Paroxysmal atrial fibrillation: Secondary | ICD-10-CM | POA: Diagnosis not present

## 2020-02-27 LAB — GLUCOSE, CAPILLARY
Glucose-Capillary: 120 mg/dL — ABNORMAL HIGH (ref 70–99)
Glucose-Capillary: 199 mg/dL — ABNORMAL HIGH (ref 70–99)
Glucose-Capillary: 209 mg/dL — ABNORMAL HIGH (ref 70–99)
Glucose-Capillary: 254 mg/dL — ABNORMAL HIGH (ref 70–99)

## 2020-02-27 LAB — HEPARIN LEVEL (UNFRACTIONATED): Heparin Unfractionated: 0.32 IU/mL (ref 0.30–0.70)

## 2020-02-27 MED ORDER — CLOPIDOGREL BISULFATE 75 MG PO TABS
300.0000 mg | ORAL_TABLET | Freq: Every day | ORAL | Status: DC
  Administered 2020-02-28: 300 mg via ORAL
  Filled 2020-02-27: qty 4

## 2020-02-27 MED ORDER — CLOPIDOGREL BISULFATE 75 MG PO TABS: 75.0000 mg | ORAL_TABLET | Freq: Every day | ORAL | Status: DC

## 2020-02-27 MED ORDER — DILTIAZEM HCL-DEXTROSE 125-5 MG/125ML-% IV SOLN (PREMIX)
5.0000 mg/h | INTRAVENOUS | Status: DC
  Administered 2020-02-27: 22:00:00 15 mg/h via INTRAVENOUS
  Administered 2020-02-27: 13:00:00 5 mg/h via INTRAVENOUS
  Administered 2020-02-28: 10 mg/h via INTRAVENOUS
  Filled 2020-02-27 (×4): qty 125

## 2020-02-27 MED ORDER — HEPARIN (PORCINE) 25000 UT/250ML-% IV SOLN
1200.0000 [IU]/h | INTRAVENOUS | Status: DC
  Administered 2020-02-27 – 2020-02-28 (×2): 1200 [IU]/h via INTRAVENOUS
  Filled 2020-02-27 (×2): qty 250

## 2020-02-27 MED ORDER — DILTIAZEM LOAD VIA INFUSION
10.0000 mg | Freq: Once | INTRAVENOUS | Status: AC
  Administered 2020-02-27: 13:00:00 10 mg via INTRAVENOUS
  Filled 2020-02-27: qty 10

## 2020-02-27 NOTE — Progress Notes (Signed)
Since cardiology has not rounded and addressed afib rvr and pt concerned.  Paged Dr. Donzetta Matters and notified him.  No new orders.

## 2020-02-27 NOTE — Progress Notes (Signed)
Pt concerned that team has not rounded on him and he is still in afib rvr rate 140.  Paged [facility] APP at this time.  Awaiting call back.

## 2020-02-27 NOTE — Progress Notes (Signed)
Progress Note  Patient Name: Ian [name] Date of Encounter: 02/27/2020  Primary Cardiologist: Sherren Mocha, MD   Subjective   64 year old gentleman with a history of coronary artery disease-status post recent stenting.  He has significant peripheral vascular disease and is status post femoropopliteal surgery.  He went into rapid atrial fibrillation this morning. He can tell that his heart is beating a little fast but otherwise does not have any specific cardiac complaints.   Inpatient Medications    Scheduled Meds:  aspirin EC  81 mg Oral Daily   diltiazem  10 mg Intravenous Once   docusate sodium  100 mg Oral Daily   gabapentin  300 mg Oral QHS   heparin  5,000 Units Subcutaneous Q8H   hydrochlorothiazide  25 mg Oral Daily   insulin aspart  0-15 Units Subcutaneous TID WC   insulin aspart  0-5 Units Subcutaneous QHS   insulin glargine  40 Units Subcutaneous QHS   isosorbide mononitrate  60 mg Oral Daily   linagliptin  5 mg Oral Daily   metFORMIN  500 mg Oral BID   metoprolol succinate  100 mg Oral BID   pantoprazole  40 mg Oral Daily   prasugrel  10 mg Oral Daily   ramipril  10 mg Oral Daily   rosuvastatin  40 mg Oral QHS   Continuous Infusions:  sodium chloride 125 mL/hr at 02/27/20 0800   sodium chloride     sodium chloride Stopped (02/26/20 0813)   diltiazem (CARDIZEM) infusion     magnesium sulfate bolus IVPB     PRN Meds: sodium chloride, acetaminophen **OR** acetaminophen, alum & mag hydroxide-simeth, guaiFENesin-dextromethorphan, hydrALAZINE, labetalol, magnesium sulfate bolus IVPB, morphine injection, nitroGLYCERIN, ondansetron, oxyCODONE, phenol, potassium chloride, senna-docusate, zolpidem   Vital Signs    Vitals:   02/27/20 0820 02/27/20 0900 02/27/20 1000 02/27/20 1100  BP: (!) 148/77 130/80    Pulse:      Resp: 15 19 19 12   Temp:      TempSrc:      SpO2:      Weight:      Height:        Intake/Output Summary (Last  24 hours) at 02/27/2020 1204 Last data filed at 02/27/2020 1100 Gross per 24 hour  Intake 2277.07 ml  Output 2276 ml  Net 1.07 ml   Last 3 Weights 02/25/2020 02/25/2020 02/23/2020  Weight (lbs) 185 lb 3 oz 185 lb 3 oz 184 lb 11.2 oz  Weight (kg) 84 kg 84 kg 83.779 kg      Telemetry    Atrial fibrillation with rapid ventricular response- Personally Reviewed  ECG    Atrial fibrillation with rapid ventricular response- Personally Reviewed  Physical Exam   GEN: No acute distress.   Neck: No JVD Cardiac:  Irregularly irregular, tachycardic Respiratory: Clear to auscultation bilaterally. GI: Soft, nontender, non-distended  MS:  Right femoropopliteal incision. Neuro:  Nonfocal  Psych: Normal affect   Labs    High Sensitivity Troponin:   Recent Labs  Lab 02/22/20 0938 02/22/20 1511 02/25/20 2237 02/26/20 0009  TROPONINIHS 61* 63* 16 17      Chemistry Recent Labs  Lab 02/22/20 0635 02/22/20 1511 02/23/20 0320 02/25/20 1429 02/25/20 2237  NA 137  --  137  --  136  K 3.4*  --  3.5  --  4.6  CL 97*  --  99  --  101  CO2 22  --  24  --  20*  GLUCOSE 389*  --  286*  --  402*  BUN 39*  --  37*  --  37*  CREATININE 1.34*   < > 0.94 1.33* 1.58*  CALCIUM 9.7  --  9.3  --  8.6*  PROT 7.1  --   --   --   --   ALBUMIN 3.8  --   --   --   --   AST 20  --   --   --   --   ALT 29  --   --   --   --   ALKPHOS 86  --   --   --   --   BILITOT 0.4  --   --   --   --   GFRNONAA 56*   < > >60 57* 46*  GFRAA >60   < > >60 >60 54*  ANIONGAP 18*  --  14  --  15   < > = values in this interval not displayed.     Hematology Recent Labs  Lab 02/22/20 1511 02/25/20 1429 02/25/20 2237  WBC 9.0 14.9* 10.8*  RBC 4.33 3.91* 3.59*  HGB 12.1* 11.1* 10.3*  HCT 36.7* 34.0* 31.7*  MCV 84.8 87.0 88.3  MCH 27.9 28.4 28.7  MCHC 33.0 32.6 32.5  RDW 13.5 13.4 13.7  PLT 254 260 238    BNPNo results for input(s): BNP, PROBNP in the last 168 hours.   DDimer No results for input(s): DDIMER  in the last 168 hours.   Radiology    VAS [place] ABI WITH/WO TBI  Result Date: 02/26/2020 LOWER EXTREMITY DOPPLER STUDY Indications: Ulceration.  Vascular Interventions: 02/25/2020 - Right FEMORAL-ABOVE KNEE POPLITEAL ARTERY                         BYPASS GRAFT with Saphenous vein. Comparison Study: 02/16/2020 - R 0.51 L 0.62 Performing Technologist: Carlos Levering Rvt  Examination Guidelines: A complete evaluation includes at minimum, Doppler waveform signals and systolic blood pressure reading at the level of bilateral brachial, anterior tibial, and posterior tibial arteries, when vessel segments are accessible. Bilateral testing is considered an integral part of a complete examination. Photoelectric Plethysmograph (PPG) waveforms and toe systolic pressure readings are included as required and additional duplex testing as needed. Limited examinations for reoccurring indications may be performed as noted.  ABI Findings: +---------+------------------+-----+----------+--------+  Right     Rt Pressure (mmHg) Index Waveform   Comment   +---------+------------------+-----+----------+--------+  Brachial  134                      triphasic            +---------+------------------+-----+----------+--------+  PTA       136                0.84  biphasic             +---------+------------------+-----+----------+--------+  DP        63                 0.39  monophasic           +---------+------------------+-----+----------+--------+  Great Toe 25                 0.16                       +---------+------------------+-----+----------+--------+ +---------+------------------+-----+---------+-------+  Left      Lt Pressure (mmHg) Index Waveform  Comment  +---------+------------------+-----+---------+-------+  Brachial  161                      triphasic          +---------+------------------+-----+---------+-------+  PTA       65                 0.40  biphasic           +---------+------------------+-----+---------+-------+  DP         106                0.66  biphasic           +---------+------------------+-----+---------+-------+  Great Toe 28                 0.17                     +---------+------------------+-----+---------+-------+ +-------+-----------+-----------+------------+------------+  ABI/TBI Today's ABI Today's TBI Previous ABI Previous TBI  +-------+-----------+-----------+------------+------------+  Right   0.84        0.16        0.51         0.00          +-------+-----------+-----------+------------+------------+  Left    0.66        0.17        0.62         0.00          +-------+-----------+-----------+------------+------------+  Summary: Right: Resting right ankle-brachial index indicates mild right lower extremity arterial disease. The right toe-brachial index is abnormal. Left: Resting left ankle-brachial index indicates moderate left lower extremity arterial disease. The left toe-brachial index is abnormal.  *See table(s) above for measurements and observations.     Preliminary     Cardiac Studies      Patient Profile     64 y.o. male  With CAD, recent fem-pop surgery who has developed atrial fib .  Had PCI 6 weeks ago    Assessment & Plan  1.  Rapid atrial fibrillation: Mr. Huss is in atrial fibrillation with rapid ventricular response.  He has had episodes of tachycardia but has not had atrial fibrillation to his knowledge.  We will start him on a Cardizem drip to see if we can get him to convert back to normal sinus rhythm.  We will start him on IV heparin drip and anticipate transitioning him to Eliquis in the next day or so. He is on Effient.  We will stop the Effient and start him on Plavix.  We will load him with Plavix 300 mg a day starting tomorrow and then 75 mg a day after that. Continue metoprolol for better rate control as well.  2.  Coronary artery disease: He is not having any angina.  He does have some ST segment depression on his EKG but this is likely rate controlled in the  setting of his severe baseline coronary artery disease.  3.  Peripheral arterial disease: Further plans per Dr. Donzetta Matters.      For questions or updates, please contact [place] Please consult [email or web address] for contact info under        Signed, Mertie Moores, MD  02/27/2020, 12:04 PM

## 2020-02-27 NOTE — Progress Notes (Signed)
[facility] for heparin Indication: atrial fibrillation  Allergies  Allergen Reactions  . Anectine [Succinylcholine] Other (See Comments)    Atypical pseudocholinesterase deficiency.   . Nsaids Other (See Comments)    Contraindicated (can only have asa 81mg )    Patient Measurements: Height: 5\' 4"  (162.6 cm) Weight: 84 kg (185 lb 3 oz) IBW/kg (Calculated) : 59.2 Heparin Dosing Weight: 77  Vital Signs: Temp: 98.2 F (36.8 C) (04/10 2000) Temp Source: Oral (04/10 2000) BP: 113/62 (04/10 2000) Pulse Rate: 97 (04/10 2000)  Labs: Recent Labs    02/25/20 1429 02/25/20 2237 02/26/20 0009 02/27/20 1945  HGB 11.1* 10.3*  --   --   HCT 34.0* 31.7*  --   --   PLT 260 238  --   --   HEPARINUNFRC  --   --   --  0.32  CREATININE 1.33* 1.58*  --   --   TROPONINIHS  --  16 17  --     Estimated Creatinine Clearance: 47.4 mL/min (A) (by C-G formula based on SCr of 1.58 mg/dL (H)).   Medical History: Past Medical History:  Diagnosis Date  . 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 Colon cancer ([place])   . Complication of anesthesia    atypical pseudo-cholinesterase deficiency  . Coronary artery disease involving native coronary artery of native heart with unstable angina pectoris ([place]) 01/10/2013    A) Mv CAD- CABGx 4 (LIMA-LAD, SVG-rPDA, SVG-OM1, SVG-OM2z);09/14/19: NSTEMI - Cath: p-mRCA, Ost-prox LAD, OM3 & SVG-OM1, SVG-OM2 100% CTO; Patentl LIMA-LAD. m-dLM 95% - p-mCx 90%, m-d Cx 90%. 2 Site DES PCI: LRad-RPDA - prox 95% (Resolute Onyx DES 2.75 x 15) , m-d 99% (Resolute Onyx 2.75 x 22); c) 09/16/19: Orbital Atherectomy LM-p-m LCx - Overlapping DES 2.5 x 34 ( LCx) -> 3 x 30 (LM-LCx)  . Diabetes mellitus without complication ([place])   . Family history of adverse reaction to anesthesia    sister also has atypical pseudo-cholinesterase deficiency  . GERD (gastroesophageal reflux disease)   . History of heart bypass surgery   . Hypertension   .  Myocardial infarction ([facility])     Medications:  Scheduled:  . aspirin EC  81 mg Oral Daily  . [START ON 02/28/2020] clopidogrel  300 mg Oral Daily  . [START ON 02/29/2020] clopidogrel  75 mg Oral Daily  . docusate sodium  100 mg Oral Daily  . gabapentin  300 mg Oral QHS  . hydrochlorothiazide  25 mg Oral Daily  . insulin aspart  0-15 Units Subcutaneous TID WC  . insulin aspart  0-5 Units Subcutaneous QHS  . insulin glargine  40 Units Subcutaneous QHS  . isosorbide mononitrate  60 mg Oral Daily  . linagliptin  5 mg Oral Daily  . metFORMIN  500 mg Oral BID  . metoprolol succinate  100 mg Oral BID  . pantoprazole  40 mg Oral Daily  . ramipril  10 mg Oral Daily  . rosuvastatin  40 mg Oral QHS   Infusions:  . sodium chloride 125 mL/hr at 02/27/20 1700  . sodium chloride    . sodium chloride Stopped (02/26/20 0813)  . diltiazem (CARDIZEM) infusion 15 mg/hr (02/27/20 1700)  . heparin 1,200 Units/hr (02/27/20 1700)  . magnesium sulfate bolus IVPB      Assessment: Pt is a 64 year old male with history of CAD s/p recent stenting, PVD s/p femoropopliteal surgery who went into afib with RVR today. Pharmacy has been consulted to dose  heparin. Pt was not on anticoagulation PTA but was on DAPT.   Hg is low at 10.3. Plts are wnls at 238 - on last check on 4/9. Initial heparin level came back therapeutic at 0.32, on 1200 units/hr. No s/sx of bleeding or infusion issues.   Goal of Therapy:  Heparin level 0.3-0.7 units/ml Monitor platelets by anticoagulation protocol: Yes   Plan:  Continue heparin infusion at 1200 units/hr Confirm anti-Xa level in 6 hours and daily while on heparin Continue to monitor H&H and platelets  Antonietta Jewel, PharmD, [facility] Pharmacist  Phone: [phone] 02/27/2020 9:16 PM  Please check AMION for all [facility] phone numbers After 10:00 PM, call [facility] [phone]

## 2020-02-27 NOTE — Progress Notes (Signed)
ANTICOAGULATION CONSULT NOTE - Initial Consult  Pharmacy Consult for heparin Indication: atrial fibrillation  Allergies  Allergen Reactions  . Anectine [Succinylcholine] Other (See Comments)    Atypical pseudocholinesterase deficiency.   . Nsaids Other (See Comments)    Contraindicated (can only have asa 81mg )    Patient Measurements: Height: 5\' 4"  (162.6 cm) Weight: 84 kg (185 lb 3 oz) IBW/kg (Calculated) : 59.2 Heparin Dosing Weight: 77  Vital Signs: Temp: 99 F (37.2 C) (04/10 0546) Temp Source: Oral (04/10 0546) BP: 130/80 (04/10 0900) Pulse Rate: 102 (04/10 0546)  Labs: Recent Labs    02/25/20 1429 02/25/20 2237 02/26/20 0009  HGB 11.1* 10.3*  --   HCT 34.0* 31.7*  --   PLT 260 238  --   CREATININE 1.33* 1.58*  --   TROPONINIHS  --  16 17    Estimated Creatinine Clearance: 47.4 mL/min (A) (by C-G formula based on SCr of 1.58 mg/dL (H)).   Medical History: Past Medical History:  Diagnosis Date  . 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 Colon cancer ([place])   . Complication of anesthesia    atypical pseudo-cholinesterase deficiency  . Coronary artery disease involving native coronary artery of native heart with unstable angina pectoris ([place]) 01/10/2013    A) Mv CAD- CABGx 4 (LIMA-LAD, SVG-rPDA, SVG-OM1, SVG-OM2z);09/14/19: NSTEMI - Cath: p-mRCA, Ost-prox LAD, OM3 & SVG-OM1, SVG-OM2 100% CTO; Patentl LIMA-LAD. m-dLM 95% - p-mCx 90%, m-d Cx 90%. 2 Site DES PCI: LRad-RPDA - prox 95% (Resolute Onyx DES 2.75 x 15) , m-d 99% (Resolute Onyx 2.75 x 22); c) 09/16/19: Orbital Atherectomy LM-p-m LCx - Overlapping DES 2.5 x 34 ( LCx) -> 3 x 30 (LM-LCx)  . Diabetes mellitus without complication ([place])   . Family history of adverse reaction to anesthesia    sister also has atypical pseudo-cholinesterase deficiency  . GERD (gastroesophageal reflux disease)   . History of heart bypass surgery   . Hypertension   . Myocardial infarction ([facility])     Medications:  Scheduled:  .  aspirin EC  81 mg Oral Daily  . [START ON 02/28/2020] clopidogrel  300 mg Oral Daily  . [START ON 02/29/2020] clopidogrel  75 mg Oral Daily  . diltiazem  10 mg Intravenous Once  . docusate sodium  100 mg Oral Daily  . gabapentin  300 mg Oral QHS  . heparin  5,000 Units Subcutaneous Q8H  . hydrochlorothiazide  25 mg Oral Daily  . insulin aspart  0-15 Units Subcutaneous TID WC  . insulin aspart  0-5 Units Subcutaneous QHS  . insulin glargine  40 Units Subcutaneous QHS  . isosorbide mononitrate  60 mg Oral Daily  . linagliptin  5 mg Oral Daily  . metFORMIN  500 mg Oral BID  . metoprolol succinate  100 mg Oral BID  . pantoprazole  40 mg Oral Daily  . prasugrel  10 mg Oral Daily  . ramipril  10 mg Oral Daily  . rosuvastatin  40 mg Oral QHS   Infusions:  . sodium chloride 125 mL/hr at 02/27/20 0800  . sodium chloride    . sodium chloride Stopped (02/26/20 0813)  . diltiazem (CARDIZEM) infusion    . magnesium sulfate bolus IVPB      Assessment: Pt is a 64 year old male with history of CAD s/p recent stenting, PVD s/p femoropopliteal surgery who went into afib with RVR today. Pharmacy has been consulted to dose heparin. Pt was not on anticoagulation PTA but was on DAPT.  Hg is low at 10.3. Plts are wnls at 238. No current overt bleeding noted. Prasugrel being changed to clopidogrel per Dr. Acie Fredrickson.  Goal of Therapy:  Heparin level 0.3-0.7 units/ml Monitor platelets by anticoagulation protocol: Yes   Plan:  No bolus given recent surgery Start heparin infusion at 1200 units/hr Check anti-Xa level in 6 hours and daily while on heparin Continue to monitor H&H and platelets  Sherren Kerns, PharmD PGY1 Acute Care Pharmacy Resident 02/27/2020,12:33 PM

## 2020-02-27 NOTE — Progress Notes (Signed)
Pt went into new onset afib RVR around 0800.  VSS.  Paged Dr. Acie Fredrickson at this time.  Obtaining EKG.

## 2020-02-27 NOTE — Progress Notes (Signed)
Notified [facility] APP on call as well of pt being in Afib RVR.  States, "we are all aware and we will address it on rounds".

## 2020-02-27 NOTE — Progress Notes (Signed)
Dr. Acie Fredrickson in to see pt at this time. Plan to order cardizem gtt and heparin gtt if ok'd by vascular.

## 2020-02-27 NOTE — Progress Notes (Addendum)
  Progress Note    02/27/2020 8:58 AM 2 Days Post-Op  Subjective:  No complaints.  Wants to go home   Vitals:   02/27/20 0017 02/27/20 0546  BP: 120/66 128/67  Pulse: 98 (!) 102  Resp: 20 18  Temp: 98.2 F (36.8 C) 99 F (37.2 C)  SpO2: 98% 98%   Physical Exam: Cardiac: tachycardic Lungs:  Non labored Incisions:  R groin and incisions of RLE c/d/i Extremities:  DP and PT brisk by doppler Neurologic: A&O  CBC    Component Value Date/Time   WBC 10.8 (H) 02/25/2020 2237   RBC 3.59 (L) 02/25/2020 2237   HGB 10.3 (L) 02/25/2020 2237   HCT 31.7 (L) 02/25/2020 2237   PLT 238 02/25/2020 2237   MCV 88.3 02/25/2020 2237   MCH 28.7 02/25/2020 2237   MCHC 32.5 02/25/2020 2237   RDW 13.7 02/25/2020 2237   LYMPHSABS 4.6 (H) 01/04/2020 0120   MONOABS 1.2 (H) 01/04/2020 0120   EOSABS 0.1 01/04/2020 0120   BASOSABS 0.1 01/04/2020 0120    BMET    Component Value Date/Time   NA 136 02/25/2020 2237   K 4.6 02/25/2020 2237   CL 101 02/25/2020 2237   CO2 20 (L) 02/25/2020 2237   GLUCOSE 402 (H) 02/25/2020 2237   BUN 37 (H) 02/25/2020 2237   CREATININE 1.58 (H) 02/25/2020 2237   CALCIUM 8.6 (L) 02/25/2020 2237   GFRNONAA 46 (L) 02/25/2020 2237   GFRAA 54 (L) 02/25/2020 2237    INR    Component Value Date/Time   INR 1.0 02/22/2020 0635     Intake/Output Summary (Last 24 hours) at 02/27/2020 0858 Last data filed at 02/27/2020 0547 Gross per 24 hour  Intake 1292.01 ml  Output 2275 ml  Net -982.99 ml     Assessment/Plan:  64 y.o. male is s/p R femoral to popliteal bypass with vein 2 Days Post-Op   R foot well perfused; gangrenous toe tips will be followed as an outpatient Tachycardic this morning Discharge home when cleared by Cardiology   Dagoberto Ligas, PA-C [facility] [phone] 02/27/2020 8:58 AM   I have independently interviewed and examined patient and agree with PA assessment and plan above. He is significantly more tachycardic  this morning but otherwise would be appropriate for dc. Will have cardiology evaluate again.   Christie Viscomi C. Donzetta Matters, MD [facility] Office: [phone] Pager: [phone]

## 2020-02-28 DIAGNOSIS — I480 Paroxysmal atrial fibrillation: Secondary | ICD-10-CM | POA: Diagnosis not present

## 2020-02-28 DIAGNOSIS — I739 Peripheral vascular disease, unspecified: Secondary | ICD-10-CM | POA: Diagnosis not present

## 2020-02-28 LAB — GLUCOSE, CAPILLARY
Glucose-Capillary: 130 mg/dL — ABNORMAL HIGH (ref 70–99)
Glucose-Capillary: 166 mg/dL — ABNORMAL HIGH (ref 70–99)

## 2020-02-28 MED ORDER — OXYCODONE HCL 5 MG PO TABS: 5.0000 mg | ORAL_TABLET | Freq: Four times a day (QID) | ORAL | 0 refills | Status: DC | PRN

## 2020-02-28 MED ORDER — APIXABAN 5 MG PO TABS
5.0000 mg | ORAL_TABLET | Freq: Two times a day (BID) | ORAL | Status: DC
  Administered 2020-02-28: 13:00:00 5 mg via ORAL
  Filled 2020-02-28: qty 1

## 2020-02-28 MED ORDER — DILTIAZEM HCL ER COATED BEADS 180 MG PO CP24: 180.0000 mg | ORAL_CAPSULE | Freq: Every day | ORAL | 1 refills | Status: DC

## 2020-02-28 MED ORDER — DILTIAZEM HCL ER COATED BEADS 180 MG PO CP24
180.0000 mg | ORAL_CAPSULE | Freq: Every day | ORAL | Status: DC
  Administered 2020-02-28: 13:00:00 180 mg via ORAL
  Filled 2020-02-28: qty 1

## 2020-02-28 MED ORDER — CLOPIDOGREL BISULFATE 75 MG PO TABS: 75.0000 mg | ORAL_TABLET | Freq: Every day | ORAL | 0 refills | Status: DC

## 2020-02-28 MED ORDER — APIXABAN 5 MG PO TABS: 5.0000 mg | ORAL_TABLET | Freq: Two times a day (BID) | ORAL | 1 refills | Status: DC

## 2020-02-28 NOTE — Progress Notes (Signed)
NSR x1 hr after cardizem dripped DC HR 86. Era Bumpers, RN

## 2020-02-28 NOTE — Care Management (Signed)
Patient given Eliquis cards.  Patient and wife aware that [facility] is closed and request [facility] on [address]  Shelly Bombard and PA advised.

## 2020-02-28 NOTE — Progress Notes (Signed)
Refused labs. Patient wants to talk to MD before having labs obtained. Notified Dr. Donzetta Matters as well as pharmacy.

## 2020-02-28 NOTE — Progress Notes (Signed)
Progress Note  Patient Name: Ian [name] Date of Encounter: 02/28/2020  Primary Cardiologist: Sherren Mocha, MD   Subjective   64 year old gentleman with a history of coronary artery disease-status post recent stenting.  He has significant peripheral vascular disease and is status post femoropopliteal surgery.  He went into rapid atrial fibrillation yesterday  He has converted back to normal sinus rhythm on diltiazem drip.  We have stopped his Effient and have loaded him with Plavix. The plan is to have him go home with the addition of  Eliquis, Plavix and Cardizem CD 180 mg a day in addition to what he was on   Slight nausea today    Inpatient Medications    Scheduled Meds: . aspirin EC  81 mg Oral Daily  . clopidogrel  300 mg Oral Daily  . [START ON 02/29/2020] clopidogrel  75 mg Oral Daily  . docusate sodium  100 mg Oral Daily  . gabapentin  300 mg Oral QHS  . hydrochlorothiazide  25 mg Oral Daily  . insulin aspart  0-15 Units Subcutaneous TID WC  . insulin aspart  0-5 Units Subcutaneous QHS  . insulin glargine  40 Units Subcutaneous QHS  . isosorbide mononitrate  60 mg Oral Daily  . linagliptin  5 mg Oral Daily  . metFORMIN  500 mg Oral BID  . metoprolol succinate  100 mg Oral BID  . pantoprazole  40 mg Oral Daily  . ramipril  10 mg Oral Daily  . rosuvastatin  40 mg Oral QHS   Continuous Infusions: . sodium chloride 125 mL/hr at 02/28/20 0515  . sodium chloride    . sodium chloride Stopped (02/26/20 0813)  . diltiazem (CARDIZEM) infusion 5 mg/hr (02/28/20 1019)  . heparin 1,200 Units/hr (02/28/20 1015)  . magnesium sulfate bolus IVPB     PRN Meds: sodium chloride, acetaminophen **OR** acetaminophen, alum & mag hydroxide-simeth, guaiFENesin-dextromethorphan, hydrALAZINE, labetalol, magnesium sulfate bolus IVPB, morphine injection, nitroGLYCERIN, ondansetron, oxyCODONE, phenol, potassium chloride, senna-docusate, zolpidem   Vital Signs    Vitals:   02/28/20  0000 02/28/20 0536 02/28/20 0752 02/28/20 1119  BP: 121/61 125/63 (!) 126/56 119/69  Pulse:  90 94 97  Resp: (!) 29 (!) 26 16 17   Temp:  98.4 F (36.9 C) 98.1 F (36.7 C) 98.3 F (36.8 C)  TempSrc:  Oral Oral Oral  SpO2:  97% 97% 97%  Weight:      Height:        Intake/Output Summary (Last 24 hours) at 02/28/2020 1214 Last data filed at 02/28/2020 0900 Gross per 24 hour  Intake 1173.42 ml  Output 651 ml  Net 522.42 ml   Last 3 Weights 02/25/2020 02/25/2020 02/23/2020  Weight (lbs) 185 lb 3 oz 185 lb 3 oz 184 lb 11.2 oz  Weight (kg) 84 kg 84 kg 83.779 kg      Telemetry    NSR - Personally Reviewed  ECG      Personally Reviewed  Physical Exam      Physical Exam: Blood pressure 119/69, pulse 97, temperature 98.3 F (36.8 C), temperature source Oral, resp. rate 17, height 5\' 4"  (1.626 m), weight 84 kg, SpO2 97 %.  GEN:   Middle-aged gentleman, no acute distress HEENT: Normal NECK: No JVD; No carotid bruits LYMPHATICS: No lymphadenopathy CARDIAC: RRR , no murmurs, rubs, gallops RESPIRATORY:  Clear to auscultation without rales, wheezing or rhonchi  ABDOMEN: Soft, non-tender, non-distended MUSCULOSKELETAL:  No edema; No deformity  SKIN: Warm and dry NEUROLOGIC:  Alert and oriented x 3    Labs    High Sensitivity Troponin:   Recent Labs  Lab 02/22/20 0938 02/22/20 1511 02/25/20 2237 02/26/20 0009  TROPONINIHS 61* 63* 16 17      Chemistry Recent Labs  Lab 02/22/20 0635 02/22/20 1511 02/23/20 0320 02/25/20 1429 02/25/20 2237  NA 137  --  137  --  136  K 3.4*  --  3.5  --  4.6  CL 97*  --  99  --  101  CO2 22  --  24  --  20*  GLUCOSE 389*  --  286*  --  402*  BUN 39*  --  37*  --  37*  CREATININE 1.34*   < > 0.94 1.33* 1.58*  CALCIUM 9.7  --  9.3  --  8.6*  PROT 7.1  --   --   --   --   ALBUMIN 3.8  --   --   --   --   AST 20  --   --   --   --   ALT 29  --   --   --   --   ALKPHOS 86  --   --   --   --   BILITOT 0.4  --   --   --   --   GFRNONAA  56*   < > >60 57* 46*  GFRAA >60   < > >60 >60 54*  ANIONGAP 18*  --  14  --  15   < > = values in this interval not displayed.     Hematology Recent Labs  Lab 02/22/20 1511 02/25/20 1429 02/25/20 2237  WBC 9.0 14.9* 10.8*  RBC 4.33 3.91* 3.59*  HGB 12.1* 11.1* 10.3*  HCT 36.7* 34.0* 31.7*  MCV 84.8 87.0 88.3  MCH 27.9 28.4 28.7  MCHC 33.0 32.6 32.5  RDW 13.5 13.4 13.7  PLT 254 260 238    BNPNo results for input(s): BNP, PROBNP in the last 168 hours.   DDimer No results for input(s): DDIMER in the last 168 hours.   Radiology    VAS [place] ABI WITH/WO TBI  Result Date: 02/26/2020 LOWER EXTREMITY DOPPLER STUDY Indications: Ulceration.  Vascular Interventions: 02/25/2020 - Right FEMORAL-ABOVE KNEE POPLITEAL ARTERY                         BYPASS GRAFT with Saphenous vein. Comparison Study: 02/16/2020 - R 0.51 L 0.62 Performing Technologist: Carlos Levering Rvt  Examination Guidelines: A complete evaluation includes at minimum, Doppler waveform signals and systolic blood pressure reading at the level of bilateral brachial, anterior tibial, and posterior tibial arteries, when vessel segments are accessible. Bilateral testing is considered an integral part of a complete examination. Photoelectric Plethysmograph (PPG) waveforms and toe systolic pressure readings are included as required and additional duplex testing as needed. Limited examinations for reoccurring indications may be performed as noted.  ABI Findings: +---------+------------------+-----+----------+--------+ Right    Rt Pressure (mmHg)IndexWaveform  Comment  +---------+------------------+-----+----------+--------+ Brachial 134                    triphasic          +---------+------------------+-----+----------+--------+ PTA      136               0.84 biphasic           +---------+------------------+-----+----------+--------+ DP       63  0.39 monophasic          +---------+------------------+-----+----------+--------+ Great Toe25                0.16                    +---------+------------------+-----+----------+--------+ +---------+------------------+-----+---------+-------+ Left     Lt Pressure (mmHg)IndexWaveform Comment +---------+------------------+-----+---------+-------+ Brachial 161                    triphasic        +---------+------------------+-----+---------+-------+ PTA      65                0.40 biphasic         +---------+------------------+-----+---------+-------+ DP       106               0.66 biphasic         +---------+------------------+-----+---------+-------+ Great Toe28                0.17                  +---------+------------------+-----+---------+-------+ +-------+-----------+-----------+------------+------------+ ABI/TBIToday's ABIToday's TBIPrevious ABIPrevious TBI +-------+-----------+-----------+------------+------------+ Right  0.84       0.16       0.51        0.00         +-------+-----------+-----------+------------+------------+ Left   0.66       0.17       0.62        0.00         +-------+-----------+-----------+------------+------------+  Summary: Right: Resting right ankle-brachial index indicates mild right lower extremity arterial disease. The right toe-brachial index is abnormal. Left: Resting left ankle-brachial index indicates moderate left lower extremity arterial disease. The left toe-brachial index is abnormal.  *See table(s) above for measurements and observations.     Preliminary     Cardiac Studies      Patient Profile     64 y.o. male  With CAD, recent fem-pop surgery who has developed atrial fib .  Had PCI 6 weeks ago    Assessment & Plan  1.  Rapid atrial fibrillation: Mr. Witter is in atrial fibrillation with rapid ventricular response.  He has had episodes of tachycardia but has not had atrial fibrillation to his knowledge.  He is converted on  Cardizem drip.  We will transition him to Cardizem CD 180 mg a day. . The Effient has been discontinued.  He has been loaded with Plavix and will go home on Plavix, Eliquis, aspirin 81 mg a day  2.  Coronary artery disease: He is not having any episodes of angina.  Continue current medications.  3.  Peripheral arterial disease: Further plans per the vascular surgery team.     For questions or updates, please contact [place] Please consult [email or web address] for contact info under        Signed, Mertie Moores, MD  02/28/2020, 12:14 PM

## 2020-02-28 NOTE — Progress Notes (Signed)
Discharge instructions provided to patient and wife. All medications, follow up appointments, and discharge instructions provided. IV out. Monitor off CCMD notified. Patient discharging to home.  Paulene Floor, RN

## 2020-02-28 NOTE — Progress Notes (Addendum)
  Progress Note    02/28/2020 8:35 AM 3 Days Post-Op  Subjective:  No new complaints   Vitals:   02/28/20 0536 02/28/20 0752  BP: 125/63 (!) 126/56  Pulse: 90 94  Resp: (!) 26 16  Temp: 98.4 F (36.9 C) 98.1 F (36.7 C)  SpO2: 97% 97%   Physical Exam: Lungs:  Non labored Incisions:  RLE incisions c/d/i Extremities:  R GT and 34d toe gangrenous changes stable; foot is warm and well perfused Neurologic: A&O  CBC    Component Value Date/Time   WBC 10.8 (H) 02/25/2020 2237   RBC 3.59 (L) 02/25/2020 2237   HGB 10.3 (L) 02/25/2020 2237   HCT 31.7 (L) 02/25/2020 2237   PLT 238 02/25/2020 2237   MCV 88.3 02/25/2020 2237   MCH 28.7 02/25/2020 2237   MCHC 32.5 02/25/2020 2237   RDW 13.7 02/25/2020 2237   LYMPHSABS 4.6 (H) 01/04/2020 0120   MONOABS 1.2 (H) 01/04/2020 0120   EOSABS 0.1 01/04/2020 0120   BASOSABS 0.1 01/04/2020 0120    BMET    Component Value Date/Time   NA 136 02/25/2020 2237   K 4.6 02/25/2020 2237   CL 101 02/25/2020 2237   CO2 20 (L) 02/25/2020 2237   GLUCOSE 402 (H) 02/25/2020 2237   BUN 37 (H) 02/25/2020 2237   CREATININE 1.58 (H) 02/25/2020 2237   CALCIUM 8.6 (L) 02/25/2020 2237   GFRNONAA 46 (L) 02/25/2020 2237   GFRAA 54 (L) 02/25/2020 2237    INR    Component Value Date/Time   INR 1.0 02/22/2020 0635     Intake/Output Summary (Last 24 hours) at 02/28/2020 0835 Last data filed at 02/28/2020 0000 Gross per 24 hour  Intake 1423.46 ml  Output 652 ml  Net 771.46 ml     Assessment/Plan:  64 y.o. male is s/p R femoral to popliteal bypass with vein 3 Days Post-Op   R foot well perfused Cardiology managing atrial fibrillation; patient appears to be back to sinus rhythm on cardizem Home when cleared by Cardiology   Dagoberto Ligas, PA-C [facility] [phone] 02/28/2020 8:35 AM  I have independently interviewed and examined patient and agree with PA assessment and plan above.   Nanea Jared C. Donzetta Matters, MD [facility] [place] Office: [phone] Pager: [phone]

## 2020-02-28 NOTE — Discharge Instructions (Signed)
 [facility] [place]  Discharge instructions  Lower Extremity Bypass Surgery  Please refer to the following instruction for your post-procedure care. Your surgeon or physician assistant will discuss any changes with you.  Activity  You are encouraged to walk as much as you can. You can slowly return to normal activities during the month after your surgery. Avoid strenuous activity and heavy lifting until your doctor tells you it's OK. Avoid activities such as vacuuming or swinging a golf club. Do not drive until your doctor give the OK and you are no longer taking prescription pain medications. It is also normal to have difficulty with sleep habits, eating and bowel movement after surgery. These will go away with time.  Bathing/Showering  You may shower after you go home. Do not soak in a bathtub, hot tub, or swim until the incision heals completely.  Incision Care  Clean your incision with mild soap and water. Shower every day. Pat the area dry with a clean towel. You do not need a bandage unless otherwise instructed. Do not apply any ointments or creams to your incision. If you have open wounds you will be instructed how to care for them or a visiting nurse may be arranged for you. If you have staples or sutures along your incision they will be removed at your post-op appointment. You may have skin glue on your incision. Do not peel it off. It will come off on its own in about one week. If you have a great deal of moisture in your groin, use a gauze help keep this area dry.  Diet  Resume your normal diet. There are no special food restrictions following this procedure. A low fat/ low cholesterol diet is recommended for all patients with vascular disease. In order to heal from your surgery, it is CRITICAL to get adequate nutrition. Your body requires vitamins, minerals, and protein. Vegetables are the best source of vitamins and minerals. Vegetables also provide the  perfect balance of protein. Processed food has little nutritional value, so try to avoid this.  Medications  Resume taking all your medications unless your doctor or nurse practitioner tells you not to. If your incision is causing pain, you may take over-the-counter pain relievers such as acetaminophen (Tylenol). If you were prescribed a stronger pain medication, please aware these medication can cause nausea and constipation. Prevent nausea by taking the medication with a snack or meal. Avoid constipation by drinking plenty of fluids and eating foods with high amount of fiber, such as fruits, vegetables, and grains. Take Colase 100 mg (an over-the-counter stool softener) twice a day as needed for constipation. Do not take Tylenol if you are taking prescription pain medications.  Follow Up  Our office will schedule a follow up appointment 2-3 weeks following discharge.  Please call us immediately for any of the following conditions  Severe or worsening pain in your legs or feet while at rest or while walking Increase pain, redness, warmth, or drainage (pus) from your incision site(s) Fever of 101 degree or higher The swelling in your leg with the bypass suddenly worsens and becomes more painful than when you were in the hospital If you have been instructed to feel your graft pulse then you should do so every day. If you can no longer feel this pulse, call the office immediately. Not all patients are given this instruction.  Leg swelling is common after leg bypass surgery.  The swelling should improve over a few months   following surgery. To improve the swelling, you may elevate your legs above the level of your heart while you are sitting or resting. Your surgeon or physician assistant may ask you to apply an ACE wrap or wear compression (TED) stockings to help to reduce swelling.  Reduce your risk of vascular disease  Stop smoking. If you would like help call [facility] at 1-800-QUIT-NOW  (1-800-784-8669) or [place] at 336-586-4000.  Manage your cholesterol Maintain a desired weight Control your diabetes weight Control your diabetes Keep your blood pressure down  If you have any questions, please call the office at [phone]   

## 2020-03-01 NOTE — Discharge Summary (Signed)
Bypass Discharge Summary Patient ID: Ian [name] [id number] 64 y.o. 1956/03/08  Admit date: 02/25/2020  Discharge date and time: 02/28/2020  3:28 PM   Admitting Physician: Rosetta Posner, MD   Discharge Physician: same  Admission Diagnoses: PAD (peripheral artery disease) [facility]) [I73.9]  Discharge Diagnoses: same Atrial fibrillation  Admission Condition: poor  Discharged Condition: fair  Indication for Admission: Critical limb ischemia right foot with tissue loss  Hospital Course: [name] is a 64 year old male who was brought in as an outpatient for right femoral to above-the-knee popliteal bypass with greater saphenous vein due to critical limb ischemia of right foot with tissue loss.  This was performed by Dr. Donnetta Hutching on 02/25/2020.  He tolerated procedure well and was admitted to the hospital postoperatively.  POD #1 he had biphasic peroneal and PT signals by Doppler however developed chest pain.  Cardiology was consulted.  Coronary work-up was negative.  Hospital stay then consisted of postoperative pain control as well as increasing mobility.  On postop day #3 patient developed tachycardia to the 140s and an EKG demonstrated atrial fibrillation.  Cardiology started a Cardizem drip as well as IV heparin.  Patient converted back to sinus rhythm and was transitioned to p.o. Cardizem and Eliquis.  Cardiology recommendations will be reflected in patient's discharge med reconciliation.  Patient will follow up in office in about 2 to 3 weeks to see Dr. Donnetta Hutching.  He will be taking aspirin, Plavix, and Eliquis.  He will also be prescribed 2 to 3 days of narcotic pain medication for continued postoperative pain control.  He will be discharged in stable condition.  Consults: cardiology  Treatments: surgery: Right femoral to above-the-knee popliteal artery bypass with vein by Dr. Donnetta Hutching on 02/25/2020    Disposition: Discharge disposition: 01-Home or Self Care       - For [facility]  Registry use ---  Post-op:  Wound infection: No  Graft infection: No  Transfusion: No   New Arrhythmia: Yes Patency judged by: [ x] Dopper only, [ ]  Palpable graft pulse, [ ]  Palpable distal pulse, [ ]  ABI inc. > 0.15, [ ]  Duplex D/C Ambulatory Status: Ambulatory  Complications: MI: [ x] No, [ ]  Troponin only, [ ]  EKG or Clinical CHF: No Resp failure: [x ] none, [ ]  Pneumonia, [ ]  Ventilator Chg in renal function: [ x] none, [ ]  Inc. Cr > 0.5, [ ]  Temp. Dialysis, [ ]  Permanent dialysis Stroke: [x ] None, [ ]  Minor, [ ]  Major Return to OR: No  Reason for return to OR: [ ]  Bleeding, [ ]  Infection, [ ]  Thrombosis, [ ]  Revision  Discharge medications: Statin use:  Yes ASA use:  Yes Plavix use:  Yes Beta blocker use: Yes Coumadin use: Yes, Eliquis    Patient Instructions:  Allergies as of 02/28/2020      Reactions   Anectine [succinylcholine] Other (See Comments)   Atypical pseudocholinesterase deficiency.    Nsaids Other (See Comments)   Contraindicated (can only have asa 81mg )      Medication List    STOP taking these medications   amLODipine 10 MG tablet Commonly known as: NORVASC   prasugrel 10 MG Tabs tablet Commonly known as: EFFIENT     TAKE these medications   apixaban 5 MG Tabs tablet Commonly known as: ELIQUIS Take 1 tablet (5 mg total) by mouth 2 (two) times daily.   aspirin EC 81 MG tablet Take 81 mg by mouth daily.   Basaglar KwikPen 100  UNIT/ML Inject 40 Units into the skin at bedtime.   clopidogrel 75 MG tablet Commonly known as: PLAVIX Take 1 tablet (75 mg total) by mouth daily.   CoQ10 100 MG Caps Take 100 mg by mouth daily.   diltiazem 180 MG 24 hr capsule Commonly known as: CARDIZEM CD Take 1 capsule (180 mg total) by mouth daily.   diphenhydramine-acetaminophen 25-500 MG Tabs tablet Commonly known as: TYLENOL PM Take 2 tablets by mouth at bedtime.   ezetimibe 10 MG tablet Commonly known as: ZETIA Take 1 tablet (10 mg total) by  mouth daily.   Farxiga 10 MG Tabs tablet Generic drug: dapagliflozin propanediol Take 10 mg by mouth daily.   Fish Oil 1000 MG Caps Take 1,000 mg by mouth daily.   gabapentin 300 MG capsule Commonly known as: NEURONTIN Take 300 mg by mouth at bedtime.   hydrochlorothiazide 25 MG tablet Commonly known as: HYDRODIURIL Take 1 tablet (25 mg total) by mouth daily.   insulin aspart protamine- aspart (70-30) 100 UNIT/ML injection Commonly known as: NOVOLOG MIX 70/30 Inject 20 Units into the skin in the morning, at noon, and at bedtime.   isosorbide mononitrate 60 MG 24 hr tablet Commonly known as: IMDUR Take 1 tablet (60 mg total) by mouth daily.   Januvia 100 MG tablet Generic drug: sitaGLIPtin Take 100 mg by mouth daily.   metFORMIN 500 MG tablet Commonly known as: GLUCOPHAGE Take 500 mg by mouth 2 (two) times daily.   metoprolol succinate 100 MG 24 hr tablet Commonly known as: TOPROL-XL Take 1 tablet (100 mg total) by mouth 2 (two) times daily. Take with or immediately following a meal.   nitroGLYCERIN 0.4 MG SL tablet Commonly known as: NITROSTAT Place 1 tablet (0.4 mg total) under the tongue every 5 (five) minutes x 3 doses as needed for chest pain.   oxyCODONE 5 MG immediate release tablet Commonly known as: Oxy IR/ROXICODONE Take 1 tablet (5 mg total) by mouth every 6 (six) hours as needed for moderate pain.   pantoprazole 40 MG tablet Commonly known as: PROTONIX Take 40 mg by mouth daily.   ramipril 10 MG capsule Commonly known as: ALTACE Take 10 mg by mouth daily.   rosuvastatin 40 MG tablet Commonly known as: Crestor Take 1 tablet (40 mg total) by mouth at bedtime.      Activity: activity as tolerated Diet: regular diet Wound Care: keep wound clean and dry  Follow-up with Dr. Donnetta Hutching in 2 weeks.  SignedDagoberto Ligas 03/01/2020 12:37 PM

## 2020-03-14 NOTE — Progress Notes (Signed)
Cardiology Office Note:    Date:  03/15/2020   ID:  Ian [name], DOB June 06, 1956, MRN [id number]  PCP:  Sharilyn Sites, MD  Cardiologist:  Sherren Mocha, MD   Electrophysiologist:  None   Referring MD: Sharilyn Sites, MD   Chief Complaint:  Hospitalization Follow-up (s/p R fem-pop; post op AFib; recurrent angina post op)  Patient Profile: Ian [name] is a 64 y.o. male with  Coronary artery disease  ? S/p CABG in 2008 ? S/p STEMI in 08/2019 >> PCI: DES x 2 to radial-PDA  S/p staged PCI: orbital atherectomy, DES x 2 to protected LM in the LCx ? S/p inf-lat STEMI 12/2019 >> DES to Radial-PDA  Ischemic CM ? Echocardiogram 12/2019: EF 45-50, Gr 2 DD, mild MR, mild AS (mean 6)  Peripheral Arterial Disease   S/p R fem-pop bypass (Dr. Donnetta Hutching) 02/2020    Paroxysmal atrial fibrillation   CHA2DS2-VASc=3 (CAD/PAD, HTN, Diab) >> Apixaban    Metastatic colon CA (mets to liver) ? S/p partial colectomy and wedge resection of liver  Diabetes mellitus 2   Hypertension   Peripheral neuropathy   GERD  Prior CV studies: Echocardiogram 01/04/2020 EF 45-50, mild LVH, Gr 2 DD, normal RVSF, mild to mod LAE, mild MR, mild AS (mean gradient 6 mmHg)  Cardiac catheterization 01/04/2020 LM mid stent patent LAD ost 100 LCx stent patent with ost 40 ISR; OM3 100 w/ L-L collats RCA prox 100 L Radial-RPDA ost 95, prox 75, mid stent patent w/ 30 ISR S-OM2 100 S-OM1 100 L-LAD patent PCI:  3 x 38 mm Resolute Onyx DES to L Radial-PDA  Echocardiogram 09/14/2019 EF 60-65, Gr 1 DD  History of Present Illness:    Mr. Maue was last seen in clinic in March 2021 for posthospitalization follow-up.  He was doing well after his recent PCI.  He was then seen by vascular surgery and underwent arteriogram.  He was then scheduled for right femoral to popliteal bypass surgery.  He presented to the hospital on 02/22/2020 for his procedure but noted chest pain in short stay.  He was admitted to our  service and DC the next day.  He had been off of his beta-blocker.  This was resumed.  His high-sensitivity troponins were mildly elevated without clear trend.  This was felt to be demand ischemia from elevated heart rate.  His beta-blocker was resumed and he was discharged home.   He was admitted 4/8-4/11 and underwent R fem-pop bypass with Dr. Donnetta Hutching.  He developed post op chest pain and evaluated by Cardiology.  He also developed Afib w RVR.  He converted to normal sinus rhythm on Diltiazem.  He was started on Apixaban for anticoagulation.  His Prasugrel was [facility] and he was placed on Clopidogrel.  He was also kept on ASA.    He returns for follow-up.  He is here alone.  Since discharge from the hospital, he has been doing well.  His leg feels much better.  He sees Dr. Donnetta Hutching next week.  He still has occasional episodes of chest discomfort.  These typically occur at night after he was significantly active earlier in the day.  He takes 1 nitroglycerin with prompt relief.  This pattern has not changed.  He has not had any symptoms reminiscent of his angina associated with ACS.  He has not had significant shortness of breath.  He has some mild lower extremity swelling, worse on the right.  He has not had syncope.   Past Medical  History:  Diagnosis Date  . 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 Colon cancer ([place])   . Complication of anesthesia    atypical pseudo-cholinesterase deficiency  . Coronary artery disease 01/10/2013   s/p CABG 2008 // s/p STEMI in 08/2019>>DES x 2 to L radial-PDA; staged PCI: DES x 2 to protected LM into LCx // s/p inf-lat STEMI 12/2019 >> DES to L Radial-PDA  . Diabetes mellitus   . Family Hx of adverse reaction to anesthesia    sister also has atypical pseudo-cholinesterase deficiency  . Gastroesophageal reflux disease   . History of heart bypass surgery   . HLD (hyperlipidemia)   . Hypertension   . Ischemic cardiomyopathy    Echo 01/04/2020: EF 45-50, mild LVH, Gr 2 DD,  normal RVSF, mild to mod LAE, mild MR, mild AS (mean gradient 6 mmHg)  . Myocardial infarction ([place])   . PAD (peripheral artery disease) (H[facility])    s/p R fem-pop bypass 02/2020 (Dr. Donnetta Hutching)  . Paroxysmal atrial fibrillation ([facility])    CHADS-VASc=3 // noted post op after fem pop bypass in 02/2020; due to high risk, Apixaban started    Current Medications: Current Meds  Medication Sig  . apixaban (ELIQUIS) 5 MG TABS tablet Take 1 tablet (5 mg total) by mouth 2 (two) times daily.  Marland Kitchen aspirin EC 81 MG tablet Take 81 mg by mouth daily.  . clopidogrel (PLAVIX) 75 MG tablet Take 1 tablet (75 mg total) by mouth daily.  . Coenzyme Q10 (COQ10) 100 MG CAPS Take 100 mg by mouth daily.  Marland Kitchen diltiazem (CARDIZEM CD) 180 MG 24 hr capsule Take 1 capsule (180 mg total) by mouth daily.  . diphenhydramine-acetaminophen (TYLENOL PM) 25-500 MG TABS tablet Take 2 tablets by mouth at bedtime.  Marland Kitchen FARXIGA 10 MG TABS tablet Take 10 mg by mouth daily.   Marland Kitchen gabapentin (NEURONTIN) 300 MG capsule Take 300 mg by mouth at bedtime.  . hydrochlorothiazide (HYDRODIURIL) 25 MG tablet Take 1 tablet (25 mg total) by mouth daily.  . insulin aspart protamine- aspart (NOVOLOG MIX 70/30) (70-30) 100 UNIT/ML injection Inject 20 Units into the skin in the morning, at noon, and at bedtime.   . Insulin Glargine (BASAGLAR KWIKPEN) 100 UNIT/ML SOPN Inject 40 Units into the skin at bedtime.   . isosorbide mononitrate (IMDUR) 60 MG 24 hr tablet Take 1 tablet (60 mg total) by mouth daily.  Marland Kitchen JANUVIA 100 MG tablet Take 100 mg by mouth daily.   . metFORMIN (GLUCOPHAGE) 500 MG tablet Take 500 mg by mouth 2 (two) times daily.  . metoprolol succinate (TOPROL-XL) 100 MG 24 hr tablet Take 1 tablet (100 mg total) by mouth 2 (two) times daily. Take with or immediately following a meal.  . nitroGLYCERIN (NITROSTAT) 0.4 MG SL tablet Place 1 tablet (0.4 mg total) under the tongue every 5 (five) minutes x 3 doses as needed for chest pain.  . Omega-3 Fatty Acids  (FISH OIL) 1000 MG CAPS Take 1,000 mg by mouth daily.   Marland Kitchen oxyCODONE (OXY IR/ROXICODONE) 5 MG immediate release tablet Take 1 tablet (5 mg total) by mouth every 6 (six) hours as needed for moderate pain.  . pantoprazole (PROTONIX) 40 MG tablet Take 40 mg by mouth daily.  . ramipril (ALTACE) 10 MG capsule Take 10 mg by mouth daily.  . rosuvastatin (CRESTOR) 40 MG tablet Take 1 tablet (40 mg total) by mouth at bedtime.     Allergies:   Anectine [succinylcholine] and Nsaids   Social  History   Tobacco Use  . Smoking status: Never Smoker  . Smokeless tobacco: Never Used  Substance Use Topics  . Alcohol use: No  . Drug use: No     Family Hx: The patient's family history includes Diabetes in his brother, brother, father, sister, and sister; Heart disease in his brother and father; Hypertension in his brother, father, sister, and son; Lymphoma in his brother.  ROS   EKGs/Labs/Other Test Reviewed:    EKG:  EKG is   ordered today.  The ekg ordered today demonstrates normal sinus rhythm, heart rate 88, left axis deviation, T wave inversions 1, aVL, septal Q waves, QTC 474, no change since tracing dated 02/09/2020  Recent Labs: 09/14/2019: B Natriuretic Peptide 141.9 01/06/2020: Magnesium 2.1 02/22/2020: ALT 29 02/25/2020: BUN 37; Creatinine, Ser 1.58; Hemoglobin 10.3; Platelets 238; Potassium 4.6; Sodium 136   Recent Lipid Panel Lab Results  Component Value Date/Time   CHOL 143 02/23/2020 03:20 AM   TRIG 321 (H) 02/23/2020 03:20 AM   HDL 21 (L) 02/23/2020 03:20 AM   CHOLHDL 6.8 02/23/2020 03:20 AM   LDLCALC 58 02/23/2020 03:20 AM   LDLDIRECT 141.4 (H) 09/14/2019 02:43 AM    Physical Exam:    VS:  BP 124/60   Pulse 88   Ht 5\' 4"  (1.626 m)   Wt 187 lb 12.8 oz (85.2 kg)   SpO2 92%   BMI 32.24 kg/m     Wt Readings from Last 3 Encounters:  03/15/20 187 lb 12.8 oz (85.2 kg)  02/25/20 185 lb 3 oz (84 kg)  02/23/20 184 lb 11.2 oz (83.8 kg)     Constitutional:      Appearance:  Healthy appearance. Not in distress.  Neck:     Thyroid: No thyromegaly.     Vascular: JVD normal.  Pulmonary:     Breath sounds: No wheezing. No rales.  Cardiovascular:     Normal rate. Regular rhythm. Normal S1. Normal S2.     Murmurs: There is a grade 2/6 crescendo-decrescendo systolic murmur at the URSB.  Edema:    Pretibial: trace edema of the right pretibial area. Abdominal:     Palpations: Abdomen is soft. There is no hepatomegaly.  Skin:    General: Skin is warm and dry.  Neurological:     General: No focal deficit present.     Mental Status: Alert and oriented to person, place and time.     Cranial Nerves: Cranial nerves are intact.       ASSESSMENT & PLAN:    1. Coronary artery disease involving native coronary artery of native heart with angina pectoris ([place]) History of CABG in 2008.  He suffered a myocardial infarction in October 2020 treated with a drug-eluting stent x2 to the radial graft to the PDA and staged PCI with a drug-eluting stent x2 to a protected left main into the LCx.  He had an inferolateral ST elevation myocardial infarction in February 2021 treated with a drug-eluting stent to the radial graft to the PDA secondary to in-stent restenosis.  He did have episodes of recurrent angina in the hospital prior to and after his lower extremity revascularization surgery.  This was in the setting of holding his beta-blocker.  He is currently doing well with minimal symptoms of angina on his current medical regimen.  His typical symptoms seem to occur late in the evening after being more active earlier in the day.  He does note that he is able to exert himself without  chest symptoms.  Continue current medical regimen which includes aspirin, clopidogrel, diltiazem, isosorbide, metoprolol succinate, Ramipril, rosuvastatin, ezetimibe.  He can continue to take as needed nitroglycerin.  I told him to consider changing his isosorbide to 30 mg twice daily if the symptoms continue.   I will bring him back in follow-up in the next 3 months to see Dr. Burt Knack.  2. Paroxysmal atrial fibrillation (HCC) He is high risk for recurrent atrial fibrillation. CHA2DS2-VASc=3.  Continue anticoagulation with Apixaban.  Arrange repeat BMET, CBC in 6-8 weeks.  3. PAD (peripheral artery disease) ([place]) Status post right femoral to popliteal bypass graft by Dr. Donnetta Hutching.  He has follow-up next week.  I will check with Dr. Donnetta Hutching to see if we can stop his aspirin.  From a cardiac standpoint, he could just continue on clopidogrel along with Apixaban.  Given his recent LE revascularization, I do not want to stop his ASA until I can review with Dr. Donnetta Hutching.    4. Essential hypertension The patient's blood pressure is controlled on his current regimen.  Continue current therapy.   5. Hyperlipidemia associated with type 2 diabetes mellitus ([place]) Recent LDL optimal at 58.  Continue current therapy.   Dispo:  Return in about 3 months (around 06/14/2020) for Routine Follow Up, w/ Dr. Burt Knack, in person.   Medication Adjustments/Labs and Tests Ordered: Current medicines are reviewed at length with the patient today.  Concerns regarding medicines are outlined above.  Tests Ordered: Orders Placed This Encounter  Procedures  . Basic metabolic panel  . CBC  . EKG 12-Lead   Medication Changes: No orders of the defined types were placed in this encounter.   Signed, Richardson Dopp, PA-C  03/15/2020 11:51 AM    [facility] [address], [place], [place]  [address] Phone: [phone]; Fax: [phone]

## 2020-03-15 ENCOUNTER — Ambulatory Visit (INDEPENDENT_AMBULATORY_CARE_PROVIDER_SITE_OTHER): Payer: Federal, State, Local not specified - PPO | Admitting: Physician Assistant

## 2020-03-15 ENCOUNTER — Encounter: Payer: Self-pay | Admitting: Physician Assistant

## 2020-03-15 ENCOUNTER — Other Ambulatory Visit: Payer: Self-pay

## 2020-03-15 VITALS — BP 124/60 | HR 88 | Ht 64.0 in | Wt 187.8 lb

## 2020-03-15 DIAGNOSIS — I25119 Atherosclerotic heart disease of native coronary artery with unspecified angina pectoris: Secondary | ICD-10-CM

## 2020-03-15 DIAGNOSIS — I10 Essential (primary) hypertension: Secondary | ICD-10-CM | POA: Diagnosis not present

## 2020-03-15 DIAGNOSIS — I480 Paroxysmal atrial fibrillation: Secondary | ICD-10-CM | POA: Diagnosis not present

## 2020-03-15 DIAGNOSIS — I739 Peripheral vascular disease, unspecified: Secondary | ICD-10-CM | POA: Diagnosis not present

## 2020-03-15 DIAGNOSIS — E1169 Type 2 diabetes mellitus with other specified complication: Secondary | ICD-10-CM

## 2020-03-15 DIAGNOSIS — E785 Hyperlipidemia, unspecified: Secondary | ICD-10-CM

## 2020-03-15 NOTE — Patient Instructions (Signed)
Medication Instructions:  NONE *If you need a refill on your cardiac medications before your next appointment, please call your pharmacy*   Lab Work: TO BE DONE IN 6-8 WEEKS: BMET, CBC  If you have labs (blood work) drawn today and your tests are completely normal, you will receive your results only by: Marland Kitchen MyChart Message (if you have MyChart) OR . A paper copy in the mail If you have any lab test that is abnormal or we need to change your treatment, we will call you to review the results.   Testing/Procedures: NONE   Follow-Up: At [facility], you and your health needs are our priority.  As part of our continuing mission to provide you with exceptional heart care, we have created designated Provider Care Teams.  These Care Teams include your primary Cardiologist (physician) and Advanced Practice Providers (APPs -  Physician Assistants and Nurse Practitioners) who all work together to provide you with the care you need, when you need it.  We recommend signing up for the patient portal called "MyChart".  Sign up information is provided on this After Visit Summary.  MyChart is used to connect with patients for Virtual Visits (Telemedicine).  Patients are able to view lab/test results, encounter notes, upcoming appointments, etc.  Non-urgent messages can be sent to your provider as well.   To learn more about what you can do with MyChart, go to [email or web address].    Your next appointment:   2-3  month(s)  The format for your next appointment:   In Person  Provider:   Sherren Mocha, MD   Other Instructions  1. PLEASE CHECK YOUR MEDICATIONS WHEN YOU GET HOME TO SEE IF YOU ARE TAKING ZETIA OR NOT. CALL OUR OFFICE TO LET [employer], PA-C KNOW. [phone]

## 2020-03-18 ENCOUNTER — Telehealth: Payer: Self-pay | Admitting: Physician Assistant

## 2020-03-18 NOTE — Telephone Encounter (Signed)
I called and left patient a detailed message advising him per Nicki Reaper to stop Aspirin 81 mg and continue on Plavix and Eliquis. Advised patient to call [phone] if there were any questions.

## 2020-03-18 NOTE — Telephone Encounter (Signed)
Please call patient I d/w Dr. Donnetta Hutching and Dr. Burt Knack. He can stop ASA. Remain on Plavix and Eliquis. Richardson Dopp, PA-C    03/18/2020 1:44 PM

## 2020-03-21 ENCOUNTER — Telehealth (HOSPITAL_COMMUNITY): Payer: Self-pay

## 2020-03-21 NOTE — Telephone Encounter (Signed)

## 2020-03-21 NOTE — Telephone Encounter (Signed)
I called to follow up with patient, he did receive my message on Friday. Patient is aware to discontinue Aspirin and remain on Plavix and Eliquis.

## 2020-03-22 ENCOUNTER — Other Ambulatory Visit: Payer: Self-pay

## 2020-03-22 ENCOUNTER — Encounter: Payer: Self-pay | Admitting: Vascular Surgery

## 2020-03-22 ENCOUNTER — Ambulatory Visit (INDEPENDENT_AMBULATORY_CARE_PROVIDER_SITE_OTHER): Payer: Self-pay | Admitting: Vascular Surgery

## 2020-03-22 VITALS — BP 131/69 | HR 88 | Temp 98.1°F | Resp 16 | Ht 64.0 in | Wt 190.1 lb

## 2020-03-22 DIAGNOSIS — I70229 Atherosclerosis of native arteries of extremities with rest pain, unspecified extremity: Secondary | ICD-10-CM

## 2020-03-22 DIAGNOSIS — I739 Peripheral vascular disease, unspecified: Secondary | ICD-10-CM

## 2020-03-22 DIAGNOSIS — I998 Other disorder of circulatory system: Secondary | ICD-10-CM

## 2020-03-22 NOTE — Progress Notes (Signed)
Patient name: Ian Ian [name] MRN: [id number] DOB: [birth year] Sex: male  REASON FOR VISIT: Follow-up recent right femoral to above-knee popliteal bypass for critical limb ischemia on 02/25/2020  HPI: Ian Ian [name] is a 64 y.o. male here for follow-up.  He had presented with critical limb ischemia with severe right Ian [name] rest pain and tissue loss on the tips of several toes.  Arteriogram revealed complete occlusion of his superficial femoral artery with reconstitution of above-knee popliteal artery.  He presented for surgery and was having tachycardia and chest pain and diaphoresis and the surgery was canceled.  He eventually was cleared and was taken to the operating room and had uneventful surgery.  He is quite pleased with his result.  His rest pain is completely resolved immediately following surgery.  He has mild discomfort in his lower extremity  Current Outpatient Medications  Medication Sig Dispense Refill  . apixaban (ELIQUIS) 5 MG TABS tablet Take 1 tablet (5 mg total) by mouth 2 (two) times daily. 60 tablet 1  . clopidogrel (PLAVIX) 75 MG tablet Take 1 tablet (75 mg total) by mouth daily. 90 tablet 0  . Coenzyme Q10 (COQ10) 100 MG CAPS Take 100 mg by mouth daily.    Marland Kitchen diltiazem (CARDIZEM CD) 180 MG 24 hr capsule Take 1 capsule (180 mg total) by mouth daily. 30 capsule 1  . diphenhydramine-acetaminophen (TYLENOL PM) 25-500 MG TABS tablet Take 2 tablets by mouth at bedtime.    Marland Kitchen ezetimibe (ZETIA) 10 MG tablet Take 1 tablet (10 mg total) by mouth daily. 30 tablet 1  . FARXIGA 10 MG TABS tablet Take 10 mg by mouth daily.     Marland Kitchen gabapentin (NEURONTIN) 300 MG capsule Take 300 mg by mouth at bedtime.    . insulin aspart protamine- aspart (NOVOLOG MIX 70/30) (70-30) 100 UNIT/ML injection Inject 20 Units into the skin in the morning, at noon, and at bedtime.     . Insulin Glargine (BASAGLAR KWIKPEN) 100 UNIT/ML SOPN Inject 40 Units into the skin at bedtime.       . isosorbide mononitrate (IMDUR) 60 MG 24 hr tablet Take 1 tablet (60 mg total) by mouth daily. 90 tablet 3  . JANUVIA 100 MG tablet Take 100 mg by mouth daily.     . metFORMIN (GLUCOPHAGE) 500 MG tablet Take 500 mg by mouth 2 (two) times daily.    . metoprolol succinate (TOPROL-XL) 100 MG 24 hr tablet Take 1 tablet (100 mg total) by mouth 2 (two) times daily. Take with or immediately following a meal. 60 tablet 1  . nitroGLYCERIN (NITROSTAT) 0.4 MG SL tablet Place 1 tablet (0.4 mg total) under the tongue every 5 (five) minutes x 3 doses as needed for chest pain. 75 tablet 3  . Omega-3 Fatty Acids (FISH OIL) 1000 MG CAPS Take 1,000 mg by mouth daily.     Marland Kitchen oxyCODONE (OXY IR/ROXICODONE) 5 MG immediate release tablet Take 1 tablet (5 mg total) by mouth every 6 (six) hours as needed for moderate pain. 30 tablet 0  . pantoprazole (PROTONIX) 40 MG tablet Take 40 mg by mouth daily.    . ramipril (ALTACE) 10 MG capsule Take 10 mg by mouth daily.    . rosuvastatin (CRESTOR) 40 MG tablet Take 1 tablet (40 mg total) by mouth at bedtime. 90 tablet 0  . aspirin EC 81 MG tablet Take 81 mg by mouth daily.    . hydrochlorothiazide (HYDRODIURIL) 25 MG tablet Take 1 tablet (25 mg total)  by mouth daily. 90 tablet 3   No current facility-administered medications for this visit.     PHYSICAL EXAM: Vitals:   03/22/20 1547  BP: 131/69  Pulse: 88  Resp: 16  Temp: 98.1 F (36.7 C)  TempSrc: Temporal  SpO2: 96%  Weight: 190 lb 1.6 oz (86.2 kg)  Height: 5\' 4"  (1.626 m)    GENERAL: The patient is a well-nourished male, in no acute distress. The vital signs are documented above. Mild swelling in his right calf and Ian [name].  2+ dorsalis pedis pulse.  2+ popliteal pulse.  Good healing of his popliteal and groin incision on the right  MEDICAL ISSUES: Stable 1 month status post right femoral to above-knee popliteal bypass for critical limb ischemia.  We will continue full activities.  We will see him again in 3  months in our [place] office with ankle arm index and graft scan.  He will notify should he develop any difficulty in the interim   Rosetta Posner, MD FACS [facility] Tel [phone] Pager [phone]

## 2020-03-28 ENCOUNTER — Telehealth: Payer: Self-pay | Admitting: Cardiovascular Disease

## 2020-03-28 NOTE — Telephone Encounter (Signed)
° °  Pt c/o Shortness Of Breath: STAT if SOB developed within the last 24 hours or pt is noticeably SOB on the phone  1. Are you currently SOB (can you hear that pt is SOB on the phone)? No  2. How long have you been experiencing SOB? All day yesterday  3. Are you SOB when sitting or when up moving around? Moving around  4. Are you currently experiencing any other symptoms? Pt's wife said both of they not feeling well over the weekend. Yesterday morning her husband felt SOB and last yesterday night. She said he feels fine today but his HR is raising and a little swelling on his feet.  Please call

## 2020-03-28 NOTE — Telephone Encounter (Signed)
The patient reports intermittent SOB throughout the day yesterday. Today his breathing is better, but he has slight swelling in his ankles and feet. His shoes still fit.  He has also had several episodes throughout the day when he felt his heart was racing. The racing is not accompanied by any other symptoms and he specifically denies CP, SOB, lightheadedness, dizziness. BP and HR were checked on the phone. BP = 153/84 and HR = 88. He is taking his medications as directed. He currently feels fine. He will elevate his legs and limit his salt (he has been eating a lot of ham lately). He will check his VS when he feels his heart racing.  He understands he will be called back if Dr. Burt Knack has recommendations.

## 2020-03-31 NOTE — Telephone Encounter (Signed)
Left message to call back  

## 2020-03-31 NOTE — Telephone Encounter (Signed)
The patient is known to have severe ischemic symptoms with elevated heart rates in the setting of multivessel CAD. He is on a high dose of Toprol XL. Recommend increasing cardizem CD to 360 mg daily.

## 2020-04-01 MED ORDER — DILTIAZEM HCL ER COATED BEADS 360 MG PO CP24: 360.0000 mg | ORAL_CAPSULE | Freq: Every day | ORAL | 3 refills | Status: DC

## 2020-04-01 NOTE — Telephone Encounter (Signed)
Instructed the patient to INCREASE CARDIZEM to 360 mg daily. He will monitor symptoms and BP and HR and call if he does not see any improvement. He was grateful for assistance.

## 2020-04-05 ENCOUNTER — Ambulatory Visit: Payer: 59 | Admitting: Physician Assistant

## 2020-04-11 ENCOUNTER — Other Ambulatory Visit: Payer: Self-pay

## 2020-04-11 ENCOUNTER — Other Ambulatory Visit: Payer: Federal, State, Local not specified - PPO | Admitting: *Deleted

## 2020-04-11 DIAGNOSIS — I2510 Atherosclerotic heart disease of native coronary artery without angina pectoris: Secondary | ICD-10-CM

## 2020-04-11 DIAGNOSIS — I739 Peripheral vascular disease, unspecified: Secondary | ICD-10-CM | POA: Diagnosis not present

## 2020-04-11 DIAGNOSIS — I10 Essential (primary) hypertension: Secondary | ICD-10-CM | POA: Diagnosis not present

## 2020-04-11 DIAGNOSIS — E785 Hyperlipidemia, unspecified: Secondary | ICD-10-CM | POA: Diagnosis not present

## 2020-04-11 LAB — LIPID PANEL
Chol/HDL Ratio: 6 ratio — ABNORMAL HIGH (ref 0.0–5.0)
Cholesterol, Total: 132 mg/dL (ref 100–199)
HDL: 22 mg/dL — ABNORMAL LOW (ref >39)
LDL Chol Calc (NIH): 60 mg/dL (ref 0–99)
Triglycerides: 321 mg/dL — ABNORMAL HIGH (ref 0–149)
VLDL Cholesterol Cal: 50 mg/dL — ABNORMAL HIGH (ref 5–40)

## 2020-04-11 LAB — HEPATIC FUNCTION PANEL
ALT: 24 IU/L (ref 0–44)
AST: 23 IU/L (ref 0–40)
Albumin: 4.2 g/dL (ref 3.8–4.8)
Alkaline Phosphatase: 110 IU/L (ref 48–121)
Bilirubin Total: 0.2 mg/dL (ref 0.0–1.2)
Bilirubin, Direct: 0.07 mg/dL (ref 0.00–0.40)
Total Protein: 6.9 g/dL (ref 6.0–8.5)

## 2020-04-13 ENCOUNTER — Other Ambulatory Visit: Payer: Self-pay

## 2020-04-13 ENCOUNTER — Telehealth: Payer: Self-pay | Admitting: Cardiovascular Disease

## 2020-04-13 DIAGNOSIS — E1169 Type 2 diabetes mellitus with other specified complication: Secondary | ICD-10-CM

## 2020-04-13 DIAGNOSIS — E785 Hyperlipidemia, unspecified: Secondary | ICD-10-CM

## 2020-04-13 DIAGNOSIS — I25110 Atherosclerotic heart disease of native coronary artery with unstable angina pectoris: Secondary | ICD-10-CM

## 2020-04-13 MED ORDER — ICOSAPENT ETHYL 1 G PO CAPS
2.0000 g | ORAL_CAPSULE | Freq: Two times a day (BID) | ORAL | 4 refills | Status: DC
Start: 2020-04-13 — End: 2020-09-27

## 2020-04-13 NOTE — Telephone Encounter (Signed)
Patient returned my phone call. The patient has been notified of the result and verbalized understanding.  All questions (if any) were answered. Fish oil stopped on medication list. Vascepa sent to patients pharmacy. Orders in for repeat labs on 07/15/20. Mady Haagensen, [place] 04/13/2020 4:17 PM

## 2020-04-13 NOTE — Telephone Encounter (Signed)
Patient is returning Emily's call to discuss his lipid lab results. Please call.

## 2020-05-12 NOTE — Progress Notes (Deleted)
Cardiology Office Note:    Date:  05/12/2020   ID:  Ian [name], DOB 1955-12-01, MRN [id number]  PCP:  Sharilyn Sites, MD  Cardiologist:  Sherren Mocha, MD *** Electrophysiologist:  None   Referring MD: Sharilyn Sites, MD   Chief Complaint:  No chief complaint on file.    Patient Profile:    Ian [name] is a 64 y.o. male with:   Coronary artery disease  ? S/p CABG in 2008 ? S/p STEMI in 08/2019 >> PCI: DES x 2 to radial-PDA  S/p staged PCI: orbital atherectomy, DES x 2 to protected LM in the LCx ? S/p inf-lat STEMI 12/2019 >> DES to Radial-PDA  Ischemic CM ? Echocardiogram 12/2019: EF 45-50, Gr 2 DD, mild MR, mild AS (mean 6)  Peripheral Arterial Disease  ? S/p R fem-pop bypass (Dr. Donnetta Hutching) 02/2020    Paroxysmal atrial fibrillation  ? CHA2DS2-VASc=3 (CAD/PAD, HTN, Diab) >> Apixaban    Metastatic colon CA (mets to liver) ? S/p partial colectomy and wedge resection of liver  Diabetes mellitus 2   Hypertension   Peripheral neuropathy   GERD  Prior CV studies: Echocardiogram 01/04/2020 EF 45-50, mild LVH, Gr 2 DD, normal RVSF, mild to mod LAE, mild MR, mild AS (mean gradient 6 mmHg)  Cardiac catheterization 01/04/2020 LM mid stent patent LAD ost 100 LCx stent patent with ost 40 ISR; OM3 100 w/ L-L collats RCA prox 100 L Radial-RPDA ost 95, prox 75, mid stent patent w/ 30 ISR S-OM2 100 S-OM1 100 L-LAD patent PCI: 3 x 38 mm Resolute Onyx DES to L Radial-PDA  Echocardiogram 09/14/2019 EF 60-65, Gr 1 DD   History of Present Illness:    Ian [name] developed atrial fibrillation with rapid ventricular rate in April 2021 after his right femoral to popliteal bypass graft.  He was placed on Apixaban at that time and his prasugrel was changed to clopidogrel.  He was last seen in the office by me in April 2021.  He was still having some anginal symptoms at that time.  However, his symptoms were overall stable.  He returns for follow-up.  The DICTATELATER  SmartLink is not supported in this context. ***   Past Medical History:  Diagnosis Date  . 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 Colon cancer ([place])   . Complication of anesthesia    atypical pseudo-cholinesterase deficiency  . Coronary artery disease 01/10/2013   s/p CABG 2008 // s/p STEMI in 08/2019>>DES x 2 to L radial-PDA; staged PCI: DES x 2 to protected LM into LCx // s/p inf-lat STEMI 12/2019 >> DES to L Radial-PDA  . Diabetes mellitus   . Family Hx of adverse reaction to anesthesia    sister also has atypical pseudo-cholinesterase deficiency  . Gastroesophageal reflux disease   . History of heart bypass surgery   . HLD (hyperlipidemia)   . Hypertension   . Ischemic cardiomyopathy    Echo 01/04/2020: EF 45-50, mild LVH, Gr 2 DD, normal RVSF, mild to mod LAE, mild MR, mild AS (mean gradient 6 mmHg)  . Myocardial infarction ([place])   . PAD (peripheral artery disease) (HCC)    s/p R fem-pop bypass 02/2020 (Dr. Donnetta Hutching)  . Paroxysmal atrial fibrillation (HCC)    CHADS-VASc=3 // noted post op after fem pop bypass in 02/2020; due to high risk, Apixaban started    Current Medications: No outpatient medications have been marked as taking for the 05/13/20 encounter (Appointment) with Richardson Dopp T, PA-C.  Allergies:   Anectine [succinylcholine] and Nsaids   Social History   Tobacco Use  . Smoking status: Never Smoker  . Smokeless tobacco: Never Used  Vaping Use  . Vaping Use: Never used  Substance Use Topics  . Alcohol use: No  . Drug use: No     Family Hx: The patient's family history includes Diabetes in his brother, brother, father, sister, and sister; Heart disease in his brother and father; Hypertension in his brother, father, sister, and son; Lymphoma in his brother.  ROS   EKGs/Labs/Other Test Reviewed:    EKG:  EKG is *** ordered today.  The ekg ordered today demonstrates ***  Recent Labs: 09/14/2019: B Natriuretic Peptide 141.9 01/06/2020: Magnesium  2.1 02/25/2020: BUN 37; Creatinine, Ser 1.58; Hemoglobin 10.3; Platelets 238; Potassium 4.6; Sodium 136 04/11/2020: ALT 24   Recent Lipid Panel Lab Results  Component Value Date/Time   CHOL 132 04/11/2020 08:50 AM   TRIG 321 (H) 04/11/2020 08:50 AM   HDL 22 (L) 04/11/2020 08:50 AM   CHOLHDL 6.0 (H) 04/11/2020 08:50 AM   CHOLHDL 6.8 02/23/2020 03:20 AM   LDLCALC 60 04/11/2020 08:50 AM   LDLDIRECT 141.4 (H) 09/14/2019 02:43 AM    Physical Exam:    VS:  There were no vitals taken for this visit.    Wt Readings from Last 3 Encounters:  03/22/20 190 lb 1.6 oz (86.2 kg)  03/15/20 187 lb 12.8 oz (85.2 kg)  02/25/20 185 lb 3 oz (84 kg)     Physical Exam ***  ASSESSMENT & PLAN:    *** 1. Coronary artery disease involving native coronary artery of native heart with angina pectoris ([place]) History of CABG in 2008. He suffered a myocardial infarction in October 2020 treated with a drug-eluting stent x2 to the radial graft to the PDA and staged PCI with a drug-eluting stent x2 to a protected left main into the LCx. He had an inferolateral ST elevation myocardial infarction in February 2021 treated with a drug-eluting stent to the radial graft to the PDA secondary to in-stent restenosis.  He did have episodes of recurrent angina in the hospital prior to and after his lower extremity revascularization surgery.  This was in the setting of holding his beta-blocker.  He is currently doing well with minimal symptoms of angina on his current medical regimen.  His typical symptoms seem to occur late in the evening after being more active earlier in the day.  He does note that he is able to exert himself without chest symptoms.  Continue current medical regimen which includes aspirin, clopidogrel, diltiazem, isosorbide, metoprolol succinate, Ramipril, rosuvastatin, ezetimibe.  He can continue to take as needed nitroglycerin.  I told him to consider changing his isosorbide to 30 mg twice daily if the symptoms  continue.  I will bring him back in follow-up in the next 3 months to see Dr. Burt Knack.  2. Paroxysmal atrial fibrillation (HCC) He is high risk for recurrent atrial fibrillation. CHA2DS2-VASc=3.  Continue anticoagulation with Apixaban.  Arrange repeat BMET, CBC in 6-8 weeks.  3. PAD (peripheral artery disease) ([place]) Status post right femoral to popliteal bypass graft by Dr. Donnetta Hutching.  He has follow-up next week.  I will check with Dr. Donnetta Hutching to see if we can stop his aspirin.  From a cardiac standpoint, he could just continue on clopidogrel along with Apixaban.  Given his recent LE revascularization, I do not want to stop his ASA until I can review with Dr. Donnetta Hutching.  4. Essential hypertension The patient's blood pressure is controlled on his current regimen.  Continue current therapy.   5. Hyperlipidemia associated with type 2 diabetes mellitus ([place]) Recent LDL optimal at 58.  Continue current therapy.  Dispo:  No follow-ups on file.   Medication Adjustments/Labs and Tests Ordered: Current medicines are reviewed at length with the patient today.  Concerns regarding medicines are outlined above.  Tests Ordered: No orders of the defined types were placed in this encounter.  Medication Changes: No orders of the defined types were placed in this encounter.   Signed, Richardson Dopp, PA-C  05/12/2020 5:08 PM    [facility] [place], [place], [place]  [address] Phone: [phone]; Fax: [phone]

## 2020-05-13 ENCOUNTER — Other Ambulatory Visit: Payer: Self-pay

## 2020-05-13 ENCOUNTER — Encounter: Payer: Self-pay | Admitting: Physician Assistant

## 2020-05-13 ENCOUNTER — Ambulatory Visit: Payer: Federal, State, Local not specified - PPO | Admitting: Physician Assistant

## 2020-05-13 ENCOUNTER — Other Ambulatory Visit: Payer: Federal, State, Local not specified - PPO | Admitting: *Deleted

## 2020-05-13 DIAGNOSIS — I480 Paroxysmal atrial fibrillation: Secondary | ICD-10-CM | POA: Diagnosis not present

## 2020-05-13 LAB — CBC
Hematocrit: 38.3 % (ref 37.5–51.0)
Hemoglobin: 12.3 g/dL — ABNORMAL LOW (ref 13.0–17.7)
MCH: 27 pg (ref 26.6–33.0)
MCHC: 32.1 g/dL (ref 31.5–35.7)
MCV: 84 fL (ref 79–97)
Platelets: 273 10*3/uL (ref 150–450)
RBC: 4.56 x10E6/uL (ref 4.14–5.80)
RDW: 13.6 % (ref 11.6–15.4)
WBC: 8.7 10*3/uL (ref 3.4–10.8)

## 2020-05-13 LAB — BASIC METABOLIC PANEL
BUN/Creatinine Ratio: 23 (ref 10–24)
BUN: 27 mg/dL (ref 8–27)
CO2: 22 mmol/L (ref 20–29)
Calcium: 10.1 mg/dL (ref 8.6–10.2)
Chloride: 99 mmol/L (ref 96–106)
Creatinine, Ser: 1.15 mg/dL (ref 0.76–1.27)
GFR calc Af Amer: 78 mL/min/{1.73_m2} (ref >59)
GFR calc non Af Amer: 67 mL/min/{1.73_m2} (ref >59)
Glucose: 223 mg/dL — ABNORMAL HIGH (ref 65–99)
Potassium: 3.7 mmol/L (ref 3.5–5.2)
Sodium: 138 mmol/L (ref 134–144)

## 2020-05-31 ENCOUNTER — Other Ambulatory Visit: Payer: Self-pay

## 2020-05-31 ENCOUNTER — Other Ambulatory Visit: Payer: Self-pay | Admitting: Cardiovascular Disease

## 2020-05-31 MED ORDER — NITROGLYCERIN 0.4 MG SL SUBL: 0.4000 mg | SUBLINGUAL_TABLET | SUBLINGUAL | 2 refills | Status: DC | PRN

## 2020-05-31 MED ORDER — APIXABAN 5 MG PO TABS: 5.0000 mg | ORAL_TABLET | Freq: Two times a day (BID) | ORAL | 5 refills | Status: DC

## 2020-05-31 MED ORDER — CLOPIDOGREL BISULFATE 75 MG PO TABS: 75.0000 mg | ORAL_TABLET | Freq: Every day | ORAL | 2 refills | Status: DC

## 2020-05-31 NOTE — Telephone Encounter (Signed)
Last OV 03/15/20 Scr 1.15 on 05/13/20 64 years old 30kg

## 2020-05-31 NOTE — Telephone Encounter (Signed)
   *  STAT* If patient is at the pharmacy, call can be transferred to refill team.   1. Which medications need to be refilled? (please list name of each medication and dose if known)   apixaban (ELIQUIS) 5 MG TABS tablet    2. Which pharmacy/location (including street and city if local pharmacy) is medication to be sent to? [place]  3. Do they need a 30 day or 90 day supply? 30 days

## 2020-06-06 ENCOUNTER — Other Ambulatory Visit: Payer: Self-pay | Admitting: Pharmacist

## 2020-06-15 ENCOUNTER — Ambulatory Visit: Payer: Federal, State, Local not specified - PPO | Admitting: Physician Assistant

## 2020-06-20 NOTE — Progress Notes (Deleted)
Cardiology Office Note:    Date:  06/20/2020   ID:  Ian [name], DOB December 01, 1955, MRN [id number]  PCP:  Sharilyn Sites, MD  Cardiologist:  Sherren Mocha, MD *** Electrophysiologist:  None   Referring MD: Sharilyn Sites, MD   Chief Complaint:  No chief complaint on file.    Patient Profile:    Ian [name] is a 64 y.o. male with:   Coronary artery disease  ? S/p CABG in 2008 ? S/p STEMI in 08/2019 >> PCI: DES x 2 to radial-PDA  S/p staged PCI: orbital atherectomy, DES x 2 to protected LM in the LCx ? S/p inf-lat STEMI 12/2019 >> DES to Radial-PDA  Ischemic CM ? Echocardiogram 12/2019: EF 45-50, Gr 2 DD, mild MR, mild AS (mean 6)  Peripheral Arterial Disease  ? S/p R fem-pop bypass (Dr. Donnetta Hutching) 02/2020    Paroxysmal atrial fibrillation  ? CHA2DS2-VASc=3 (CAD/PAD, HTN, Diab) >> Apixaban    Metastatic colon CA (mets to liver) ? S/p partial colectomy and wedge resection of liver  Diabetes mellitus 2   Hypertension   Peripheral neuropathy   GERD  Prior CV studies: Echocardiogram 01/04/2020 EF 45-50, mild LVH, Gr 2 DD, normal RVSF, mild to mod LAE, mild MR, mild AS (mean gradient 6 mmHg)  Cardiac catheterization 01/04/2020 LM mid stent patent LAD ost 100 LCx stent patent with ost 40 ISR; OM3 100 w/ L-L collats RCA prox 100 L Radial-RPDA ost 95, prox 75, mid stent patent w/ 30 ISR S-OM2 100 S-OM1 100 L-LAD patent PCI: 3 x 38 mm Resolute Onyx DES to L Radial-PDA  Echocardiogram 09/14/2019 EF 60-65, Gr 1 DD   History of Present Illness:    Ian [name] was last seen in 02/2020.  He returns for follow up.  The DICTATELATER SmartLink is not supported in this context. ***   Past Medical History:  Diagnosis Date  . 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 Colon cancer ([place])   . Complication of anesthesia    atypical pseudo-cholinesterase deficiency  . Coronary artery disease 01/10/2013   s/p CABG 2008 // s/p STEMI in 08/2019>>DES x 2 to L radial-PDA;  staged PCI: DES x 2 to protected LM into LCx // s/p inf-lat STEMI 12/2019 >> DES to L Radial-PDA  . Diabetes mellitus   . Family Hx of adverse reaction to anesthesia    sister also has atypical pseudo-cholinesterase deficiency  . Gastroesophageal reflux disease   . History of heart bypass surgery   . HLD (hyperlipidemia)   . Hypertension   . Ischemic cardiomyopathy    Echo 01/04/2020: EF 45-50, mild LVH, Gr 2 DD, normal RVSF, mild to mod LAE, mild MR, mild AS (mean gradient 6 mmHg)  . Myocardial infarction ([place])   . PAD (peripheral artery disease) (HCC)    s/p R fem-pop bypass 02/2020 (Dr. Donnetta Hutching)  . Paroxysmal atrial fibrillation (HCC)    CHADS-VASc=3 // noted post op after fem pop bypass in 02/2020; due to high risk, Apixaban started    Current Medications: No outpatient medications have been marked as taking for the 06/22/20 encounter (Appointment) with Richardson Dopp T, PA-C.     Allergies:   Anectine [succinylcholine] and Nsaids   Social History   Tobacco Use  . Smoking status: Never Smoker  . Smokeless tobacco: Never Used  Vaping Use  . Vaping Use: Never used  Substance Use Topics  . Alcohol use: No  . Drug use: No     Family Hx: The patient's family  history includes Diabetes in his brother, brother, father, sister, and sister; Heart disease in his brother and father; Hypertension in his brother, father, sister, and son; Lymphoma in his brother.  ROS   EKGs/Labs/Other Test Reviewed:    EKG:  EKG is *** ordered today.  The ekg ordered today demonstrates ***  Recent Labs: 09/14/2019: B Natriuretic Peptide 141.9 01/06/2020: Magnesium 2.1 04/11/2020: ALT 24 05/13/2020: BUN 27; Creatinine, Ser 1.15; Hemoglobin 12.3; Platelets 273; Potassium 3.7; Sodium 138   Recent Lipid Panel Lab Results  Component Value Date/Time   CHOL 132 04/11/2020 08:50 AM   TRIG 321 (H) 04/11/2020 08:50 AM   HDL 22 (L) 04/11/2020 08:50 AM   CHOLHDL 6.0 (H) 04/11/2020 08:50 AM   CHOLHDL 6.8  02/23/2020 03:20 AM   LDLCALC 60 04/11/2020 08:50 AM   LDLDIRECT 141.4 (H) 09/14/2019 02:43 AM    Physical Exam:    VS:  There were no vitals taken for this visit.    Wt Readings from Last 3 Encounters:  03/22/20 190 lb 1.6 oz (86.2 kg)  03/15/20 187 lb 12.8 oz (85.2 kg)  02/25/20 185 lb 3 oz (84 kg)     Physical Exam ***  ASSESSMENT & PLAN:    *** 1. Coronary artery disease involving native coronary artery of native heart with angina pectoris ([place]) History of CABG in 2008. He suffered a myocardial infarction in October 2020 treated with a drug-eluting stent x2 to the radial graft to the PDA and staged PCI with a drug-eluting stent x2 to a protected left main into the LCx. He had an inferolateral ST elevation myocardial infarction in February 2021 treated with a drug-eluting stent to the radial graft to the PDA secondary to in-stent restenosis.  He did have episodes of recurrent angina in the hospital prior to and after his lower extremity revascularization surgery.  This was in the setting of holding his beta-blocker.  He is currently doing well with minimal symptoms of angina on his current medical regimen.  His typical symptoms seem to occur late in the evening after being more active earlier in the day.  He does note that he is able to exert himself without chest symptoms.  Continue current medical regimen which includes aspirin, clopidogrel, diltiazem, isosorbide, metoprolol succinate, Ramipril, rosuvastatin, ezetimibe.  He can continue to take as needed nitroglycerin.  I told him to consider changing his isosorbide to 30 mg twice daily if the symptoms continue.  I will bring him back in follow-up in the next 3 months to see Dr. Burt Knack.  2. Paroxysmal atrial fibrillation (HCC) He is high risk for recurrent atrial fibrillation. CHA2DS2-VASc=3.  Continue anticoagulation with Apixaban.  Arrange repeat BMET, CBC in 6-8 weeks.  3. PAD (peripheral artery disease) ([place]) Status post right  femoral to popliteal bypass graft by Dr. Donnetta Hutching.  He has follow-up next week.  I will check with Dr. Donnetta Hutching to see if we can stop his aspirin.  From a cardiac standpoint, he could just continue on clopidogrel along with Apixaban.  Given his recent LE revascularization, I do not want to stop his ASA until I can review with Dr. Donnetta Hutching.    4. Essential hypertension The patient's blood pressure is controlled on his current regimen.  Continue current therapy.   5. Hyperlipidemia associated with type 2 diabetes mellitus ([place]) Recent LDL optimal at 58.  Continue current therapy.   Dispo:  No follow-ups on file.   Medication Adjustments/Labs and Tests Ordered: Current medicines are reviewed at length  with the patient today.  Concerns regarding medicines are outlined above.  Tests Ordered: No orders of the defined types were placed in this encounter.  Medication Changes: No orders of the defined types were placed in this encounter.   Signed, Richardson Dopp, PA-C  06/20/2020 4:42 PM    [facility] [place], [place], [place]  [address] Phone: [phone]; Fax: [phone]

## 2020-06-22 ENCOUNTER — Ambulatory Visit: Payer: Federal, State, Local not specified - PPO | Admitting: Physician Assistant

## 2020-06-22 DIAGNOSIS — I480 Paroxysmal atrial fibrillation: Secondary | ICD-10-CM

## 2020-06-22 DIAGNOSIS — I25119 Atherosclerotic heart disease of native coronary artery with unspecified angina pectoris: Secondary | ICD-10-CM

## 2020-06-22 DIAGNOSIS — I10 Essential (primary) hypertension: Secondary | ICD-10-CM

## 2020-06-22 DIAGNOSIS — E785 Hyperlipidemia, unspecified: Secondary | ICD-10-CM

## 2020-06-29 ENCOUNTER — Other Ambulatory Visit: Payer: Self-pay

## 2020-06-29 DIAGNOSIS — I739 Peripheral vascular disease, unspecified: Secondary | ICD-10-CM

## 2020-06-30 ENCOUNTER — Ambulatory Visit (HOSPITAL_COMMUNITY): Payer: Federal, State, Local not specified - PPO

## 2020-06-30 ENCOUNTER — Other Ambulatory Visit: Payer: Self-pay

## 2020-07-15 ENCOUNTER — Other Ambulatory Visit: Payer: Federal, State, Local not specified - PPO | Admitting: *Deleted

## 2020-07-15 ENCOUNTER — Other Ambulatory Visit: Payer: Self-pay

## 2020-07-15 DIAGNOSIS — I25110 Atherosclerotic heart disease of native coronary artery with unstable angina pectoris: Secondary | ICD-10-CM | POA: Diagnosis not present

## 2020-07-15 DIAGNOSIS — E785 Hyperlipidemia, unspecified: Secondary | ICD-10-CM

## 2020-07-15 DIAGNOSIS — E1169 Type 2 diabetes mellitus with other specified complication: Secondary | ICD-10-CM | POA: Diagnosis not present

## 2020-07-15 LAB — HEPATIC FUNCTION PANEL
ALT: 19 IU/L (ref 0–44)
AST: 16 IU/L (ref 0–40)
Albumin: 4.2 g/dL (ref 3.8–4.8)
Alkaline Phosphatase: 113 IU/L (ref 48–121)
Bilirubin Total: 0.2 mg/dL (ref 0.0–1.2)
Bilirubin, Direct: 0.11 mg/dL (ref 0.00–0.40)
Total Protein: 7 g/dL (ref 6.0–8.5)

## 2020-07-15 LAB — LIPID PANEL
Chol/HDL Ratio: 6.1 ratio — ABNORMAL HIGH (ref 0.0–5.0)
Cholesterol, Total: 152 mg/dL (ref 100–199)
HDL: 25 mg/dL — ABNORMAL LOW (ref >39)
LDL Chol Calc (NIH): 81 mg/dL (ref 0–99)
Triglycerides: 279 mg/dL — ABNORMAL HIGH (ref 0–149)
VLDL Cholesterol Cal: 46 mg/dL — ABNORMAL HIGH (ref 5–40)

## 2020-07-19 ENCOUNTER — Telehealth: Payer: Self-pay

## 2020-07-19 DIAGNOSIS — E1169 Type 2 diabetes mellitus with other specified complication: Secondary | ICD-10-CM

## 2020-07-19 DIAGNOSIS — E785 Hyperlipidemia, unspecified: Secondary | ICD-10-CM

## 2020-07-19 DIAGNOSIS — I25110 Atherosclerotic heart disease of native coronary artery with unstable angina pectoris: Secondary | ICD-10-CM

## 2020-07-19 NOTE — Telephone Encounter (Signed)
I called and left patient a detailed message with lab results. Ok per patient DPR to leave detailed message. Referral to lipid clinic started. Advised patient to contact [phone] with any questions.

## 2020-07-19 NOTE — Telephone Encounter (Signed)
-----   Message from Liliane Shi, [place] sent at 07/17/2020  8:41 PM EDT ----- Triglycerides improved some but still not to goal.  LDL is above goal.  LFTs normal.   PLAN:   -Continue current dose of Ezetimibe, Rosuvastatin, Vascepa.  -Refer to Lipid Clinic for alternatives (PCSK9 inhibitor or Bempedoic Acid) Richardson Dopp, PA-C    07/17/2020 8:37 PM

## 2020-07-20 NOTE — Progress Notes (Deleted)
Patient ID: DEZMON CONOVER                 DOB: 1956-06-23                    MRN: [id number]     HPI: Ian [name] is a 64 y.o. male patient of Dr. Antionette Char referred to lipid clinic by Richardson Dopp, PA. PMH is significant for CAD s/p CABG in 2008, STEMI in 2020 and 2021, PAD, ischemic CM, afib, colon cancer, DM, peripheral neuropathy and GERD.  Any intolerances? Compliance Exercise/diet [employer]- prefer repatha- can use copay card [id number]  Current Medications: rosuvastatin 40mg  daily, Vascepa 2g twice a day, ezetimibe 10mg  daily Intolerances:  Risk Factors: progressive ASCVD, DM,PAD LDL goal: <55  Diet:   Exercise:   Family History: The patient's family history includes Diabetes in his brother, brother, father, sister, and sister; Heart disease in his brother and father; Hypertension in his brother, father, sister, and son; Lymphoma in his brother.  Social History: never smoked, No alcohol  Labs:07/15/20 TC 152, TG 279, HDL 25, LDL 81 non HDL 127 (rosuvastatin 40mg  daily, Vascepa 2g twice a day, ezetimibe 10mg  )  Past Medical History:  Diagnosis Date  . 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 Colon cancer ([place])   . Complication of anesthesia    atypical pseudo-cholinesterase deficiency  . Coronary artery disease 01/10/2013   s/p CABG 2008 // s/p STEMI in 08/2019>>DES x 2 to L radial-PDA; staged PCI: DES x 2 to protected LM into LCx // s/p inf-lat STEMI 12/2019 >> DES to L Radial-PDA  . Diabetes mellitus   . Family Hx of adverse reaction to anesthesia    sister also has atypical pseudo-cholinesterase deficiency  . Gastroesophageal reflux disease   . History of heart bypass surgery   . HLD (hyperlipidemia)   . Hypertension   . Ischemic cardiomyopathy    Echo 01/04/2020: EF 45-50, mild LVH, Gr 2 DD, normal RVSF, mild to mod LAE, mild MR, mild AS (mean gradient 6 mmHg)  . Myocardial infarction ([place])   . PAD (peripheral artery disease) (HCC)    s/p R fem-pop bypass 02/2020  (Dr. Donnetta Hutching)  . Paroxysmal atrial fibrillation (HCC)    CHADS-VASc=3 // noted post op after fem pop bypass in 02/2020; due to high risk, Apixaban started    Current Outpatient Medications on File Prior to Visit  Medication Sig Dispense Refill  . apixaban (ELIQUIS) 5 MG TABS tablet Take 1 tablet (5 mg total) by mouth 2 (two) times daily. 60 tablet 5  . clopidogrel (PLAVIX) 75 MG tablet Take 1 tablet (75 mg total) by mouth daily. 90 tablet 2  . Coenzyme Q10 (COQ10) 100 MG CAPS Take 100 mg by mouth daily.    Marland Kitchen diltiazem (CARDIZEM CD) 360 MG 24 hr capsule Take 1 capsule (360 mg total) by mouth daily. 90 capsule 3  . diphenhydramine-acetaminophen (TYLENOL PM) 25-500 MG TABS tablet Take 2 tablets by mouth at bedtime.    Marland Kitchen ezetimibe (ZETIA) 10 MG tablet Take 1 tablet (10 mg total) by mouth daily. 30 tablet 1  . FARXIGA 10 MG TABS tablet Take 10 mg by mouth daily.     Marland Kitchen gabapentin (NEURONTIN) 300 MG capsule Take 300 mg by mouth at bedtime.    . hydrochlorothiazide (HYDRODIURIL) 25 MG tablet Take 1 tablet (25 mg total) by mouth daily. 90 tablet 3  . icosapent Ethyl (VASCEPA) 1 g capsule Take 2 capsules (2 g total)  by mouth 2 (two) times daily. 120 capsule 4  . insulin aspart protamine- aspart (NOVOLOG MIX 70/30) (70-30) 100 UNIT/ML injection Inject 20 Units into the skin in the morning, at noon, and at bedtime.     . Insulin Glargine (BASAGLAR KWIKPEN) 100 UNIT/ML SOPN Inject 40 Units into the skin at bedtime.     . isosorbide mononitrate (IMDUR) 60 MG 24 hr tablet Take 1 tablet (60 mg total) by mouth daily. 90 tablet 3  . JANUVIA 100 MG tablet Take 100 mg by mouth daily.     . metFORMIN (GLUCOPHAGE) 500 MG tablet Take 500 mg by mouth 2 (two) times daily.    . metoprolol succinate (TOPROL-XL) 100 MG 24 hr tablet Take 1 tablet (100 mg total) by mouth 2 (two) times daily. Take with or immediately following a meal. 60 tablet 1  . nitroGLYCERIN (NITROSTAT) 0.4 MG SL tablet Place 1 tablet (0.4 mg total) under  the tongue every 5 (five) minutes x 3 doses as needed for chest pain. 25 tablet 2  . oxyCODONE (OXY IR/ROXICODONE) 5 MG immediate release tablet Take 1 tablet (5 mg total) by mouth every 6 (six) hours as needed for moderate pain. 30 tablet 0  . pantoprazole (PROTONIX) 40 MG tablet Take 40 mg by mouth daily.    . ramipril (ALTACE) 10 MG capsule Take 10 mg by mouth daily.    . rosuvastatin (CRESTOR) 40 MG tablet Take 1 tablet (40 mg total) by mouth at bedtime. 90 tablet 0   No current facility-administered medications on file prior to visit.    Allergies  Allergen Reactions  . Anectine [Succinylcholine] Other (See Comments)    Atypical pseudocholinesterase deficiency.   . Nsaids Other (See Comments)    Contraindicated (can only have asa 81mg )    Assessment/Plan:  1. Hyperlipidemia -    Thank you,   Ramond Dial, Pharm.D, BCPS, CPP [facility]  [address], [place], [place] [address]  Phone: [phone]; Fax: [phone]

## 2020-07-21 ENCOUNTER — Ambulatory Visit: Payer: Federal, State, Local not specified - PPO

## 2020-07-26 ENCOUNTER — Other Ambulatory Visit: Payer: Self-pay

## 2020-07-26 ENCOUNTER — Ambulatory Visit: Payer: Federal, State, Local not specified - PPO | Admitting: Physician Assistant

## 2020-07-26 ENCOUNTER — Ambulatory Visit (INDEPENDENT_AMBULATORY_CARE_PROVIDER_SITE_OTHER): Payer: Federal, State, Local not specified - PPO | Admitting: Pharmacist

## 2020-07-26 ENCOUNTER — Encounter: Payer: Self-pay | Admitting: Physician Assistant

## 2020-07-26 VITALS — BP 120/66 | HR 85 | Ht 64.0 in | Wt 181.0 lb

## 2020-07-26 DIAGNOSIS — E1169 Type 2 diabetes mellitus with other specified complication: Secondary | ICD-10-CM | POA: Diagnosis not present

## 2020-07-26 DIAGNOSIS — I480 Paroxysmal atrial fibrillation: Secondary | ICD-10-CM

## 2020-07-26 DIAGNOSIS — E785 Hyperlipidemia, unspecified: Secondary | ICD-10-CM

## 2020-07-26 DIAGNOSIS — E782 Mixed hyperlipidemia: Secondary | ICD-10-CM

## 2020-07-26 DIAGNOSIS — I25119 Atherosclerotic heart disease of native coronary artery with unspecified angina pectoris: Secondary | ICD-10-CM | POA: Diagnosis not present

## 2020-07-26 DIAGNOSIS — I739 Peripheral vascular disease, unspecified: Secondary | ICD-10-CM | POA: Diagnosis not present

## 2020-07-26 DIAGNOSIS — I10 Essential (primary) hypertension: Secondary | ICD-10-CM | POA: Diagnosis not present

## 2020-07-26 MED ORDER — ISOSORBIDE MONONITRATE ER 30 MG PO TB24
30.0000 mg | ORAL_TABLET | Freq: Every evening | ORAL | 3 refills | Status: DC
Start: 2020-07-26 — End: 2020-10-01

## 2020-07-26 NOTE — Patient Instructions (Addendum)
Nice to see you today!  Aim for a diet full of vegetables, fruit and lean meats (chicken, [place], fish). Try to limit carbs (bread, pasta, sugar, rice) and red meat consumption.  Your goal LDL is <55 mg/dL, you're currently at 81 mg/dL  Medication Changes:  Once your insurance has approved Repatha, we will give you a call when your prescription is ready for pick-up and set up fasting labs for 3 months.   Inject Repatha 140 mg/ml to the lower abdomen every 2 weeks.   Please give [place] a call at [phone] with any questions or concerns.  For PCSK9i, inject once every other week (any day of the week that works for you) into the fatty skin of stomach, upper outer thigh or back of the arm. Clean the site with soap and warm water or an alcohol pad. Keep the medication in the fridge until you are ready to give your dose, then take it out and let warm up to room temperature for 30-60 mins.

## 2020-07-26 NOTE — Progress Notes (Signed)
Cardiology Office Note:    Date:  07/26/2020   ID:  NATANEL SNAVELY, DOB 10/21/1956, MRN [id number]  PCP:  Sharilyn Sites, MD  [facility] Cardiologist:  Sherren Mocha, MD   [facility] Electrophysiologist:  None   Referring MD: Sharilyn Sites, MD   Chief Complaint:  Follow-up (CAD, PAF)    Patient Profile:    Ian [name] is a 64 y.o. male with:   Coronary artery disease  ? S/p CABG in 2008 ? S/p STEMI in 08/2019 >> PCI: DES x 2 to radial-PDA  S/p staged PCI: orbital atherectomy, DES x 2 to protected LM in the LCx ? S/p inf-lat STEMI 12/2019 >> DES to Radial-PDA  Ischemic CM ? Echocardiogram 12/2019: EF 45-50, Gr 2 DD, mild MR, mild AS (mean 6)  Peripheral Arterial Disease  ? S/p R fem-pop bypass (Dr. Donnetta Hutching) 02/2020   ? C/b post op chest pain, AF w RVR  Paroxysmal atrial fibrillation  ? CHA2DS2-VASc=3 (CAD/PAD, HTN, Diab) >> Apixaban    Metastatic colon CA (mets to liver) ? S/p partial colectomy and wedge resection of liver  Diabetes mellitus 2   Hypertension   Hyperlipidemia   Peripheral neuropathy   GERD  Prior CV studies: Echocardiogram 01/04/2020 EF 45-50, mild LVH, Gr 2 DD, normal RVSF, mild to mod LAE, mild MR, mild AS (mean gradient 6 mmHg)  Cardiac catheterization 01/04/2020 LM mid stent patent LAD ost 100 LCx stent patent with ost 40 ISR; OM3 100 w/ L-L collats RCA prox 100 L Radial-RPDA ost 95, prox 75, mid stent patent w/ 30 ISR S-OM2 100 S-OM1 100 L-LAD patent PCI: 3 x 38 mm Resolute Onyx DES to L Radial-PDA  Echocardiogram 09/14/2019 EF 60-65, Gr 1 DD  History of Present Illness:    [name] was last seen in 02/2020.  He returns for follow up.  He is here alone. He remains stable.  He mainly has anginal symptoms in the evening, especially if he does a lot of exertional activity earlier in the day.  He can do most activities during the daytime hours without chest pain.  He usually takes 1 NTG in the PM with relief.  Overall, he  feels better.  His symptoms only occur about every 3 days.  He has not had significant shortness of breath, orthopnea, leg swelling or syncope.        Past Medical History:  Diagnosis Date  . 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 Colon cancer ([place])   . Complication of anesthesia    atypical pseudo-cholinesterase deficiency  . Coronary artery disease 01/10/2013   s/p CABG 2008 // s/p STEMI in 08/2019>>DES x 2 to L radial-PDA; staged PCI: DES x 2 to protected LM into LCx // s/p inf-lat STEMI 12/2019 >> DES to L Radial-PDA  . Diabetes mellitus   . Family Hx of adverse reaction to anesthesia    sister also has atypical pseudo-cholinesterase deficiency  . Gastroesophageal reflux disease   . History of heart bypass surgery   . HLD (hyperlipidemia)   . Hypertension   . Ischemic cardiomyopathy    Echo 01/04/2020: EF 45-50, mild LVH, Gr 2 DD, normal RVSF, mild to mod LAE, mild MR, mild AS (mean gradient 6 mmHg)  . Myocardial infarction ([place])   . PAD (peripheral artery disease) (HCC)    s/p R fem-pop bypass 02/2020 (Dr. Donnetta Hutching)  . Paroxysmal atrial fibrillation (HCC)    CHADS-VASc=3 // noted post op after fem pop bypass in 02/2020; due to  high risk, Apixaban started    Current Medications: Current Meds  Medication Sig  . apixaban (ELIQUIS) 5 MG TABS tablet Take 1 tablet (5 mg total) by mouth 2 (two) times daily.  . clopidogrel (PLAVIX) 75 MG tablet Take 1 tablet (75 mg total) by mouth daily.  . Coenzyme Q10 (COQ10) 100 MG CAPS Take 100 mg by mouth daily.  Marland Kitchen diltiazem (CARDIZEM CD) 360 MG 24 hr capsule Take 1 capsule (360 mg total) by mouth daily.  . diphenhydramine-acetaminophen (TYLENOL PM) 25-500 MG TABS tablet Take 2 tablets by mouth at bedtime.  Marland Kitchen ezetimibe (ZETIA) 10 MG tablet Take 1 tablet (10 mg total) by mouth daily.  Marland Kitchen FARXIGA 10 MG TABS tablet Take 10 mg by mouth daily.   Marland Kitchen gabapentin (NEURONTIN) 300 MG capsule Take 300 mg by mouth at bedtime.  . hydrochlorothiazide (HYDRODIURIL) 25  MG tablet Take 1 tablet (25 mg total) by mouth daily.  Marland Kitchen icosapent Ethyl (VASCEPA) 1 g capsule Take 2 capsules (2 g total) by mouth 2 (two) times daily.  . insulin aspart protamine- aspart (NOVOLOG MIX 70/30) (70-30) 100 UNIT/ML injection Inject 20 Units into the skin in the morning, at noon, and at bedtime.   . Insulin Glargine (BASAGLAR KWIKPEN) 100 UNIT/ML SOPN Inject 40 Units into the skin at bedtime.   . isosorbide mononitrate (IMDUR) 60 MG 24 hr tablet Take 1 tablet (60 mg total) by mouth daily.  Marland Kitchen JANUVIA 100 MG tablet Take 100 mg by mouth daily.   . metFORMIN (GLUCOPHAGE) 500 MG tablet Take 500 mg by mouth 2 (two) times daily.  . metoprolol succinate (TOPROL-XL) 100 MG 24 hr tablet Take 1 tablet (100 mg total) by mouth 2 (two) times daily. Take with or immediately following a meal.  . nitroGLYCERIN (NITROSTAT) 0.4 MG SL tablet Place 1 tablet (0.4 mg total) under the tongue every 5 (five) minutes x 3 doses as needed for chest pain.  Marland Kitchen oxyCODONE (OXY IR/ROXICODONE) 5 MG immediate release tablet Take 1 tablet (5 mg total) by mouth every 6 (six) hours as needed for moderate pain.  . pantoprazole (PROTONIX) 40 MG tablet Take 40 mg by mouth daily.  . ramipril (ALTACE) 10 MG capsule Take 10 mg by mouth daily.  . rosuvastatin (CRESTOR) 40 MG tablet Take 1 tablet (40 mg total) by mouth at bedtime.     Allergies:   Anectine [succinylcholine] and Nsaids   Social History   Tobacco Use  . Smoking status: Never Smoker  . Smokeless tobacco: Never Used  Vaping Use  . Vaping Use: Never used  Substance Use Topics  . Alcohol use: No  . Drug use: No     Family Hx: The patient's family history includes Diabetes in his brother, brother, father, sister, and sister; Heart disease in his brother and father; Hypertension in his brother, father, sister, and son; Lymphoma in his brother.  ROS   EKGs/Labs/Other Test Reviewed:    EKG:  EKG is  ordered today.  The ekg ordered today demonstrates normal  sinus rhythm, HR 85, normal axis, down-sloping ST segments in V4-6, QTc 466  Recent Labs: 09/14/2019: B Natriuretic Peptide 141.9 01/06/2020: Magnesium 2.1 05/13/2020: BUN 27; Creatinine, Ser 1.15; Hemoglobin 12.3; Platelets 273; Potassium 3.7; Sodium 138 07/15/2020: ALT 19   Recent Lipid Panel Lab Results  Component Value Date/Time   CHOL 152 07/15/2020 08:26 AM   TRIG 279 (H) 07/15/2020 08:26 AM   HDL 25 (L) 07/15/2020 08:26 AM   CHOLHDL 6.1 (H)  07/15/2020 08:26 AM   CHOLHDL 6.8 02/23/2020 03:20 AM   LDLCALC 81 07/15/2020 08:26 AM   LDLDIRECT 141.4 (H) 09/14/2019 02:43 AM    Physical Exam:    VS:  BP 120/66   Pulse 85   Ht 5\' 4"  (1.626 m)   Wt 181 lb (82.1 kg)   SpO2 95%   BMI 31.07 kg/m     Wt Readings from Last 3 Encounters:  07/26/20 181 lb (82.1 kg)  03/22/20 190 lb 1.6 oz (86.2 kg)  03/15/20 187 lb 12.8 oz (85.2 kg)     Constitutional:      Appearance: Healthy appearance. Not in distress.  Neck:     Vascular: JVD normal.  Pulmonary:     Effort: Pulmonary effort is normal.     Breath sounds: No wheezing. No rales.  Cardiovascular:     Normal rate. Regular rhythm. Normal S1. Normal S2.     Murmurs: There is no murmur.  Edema:    Peripheral edema absent.  Abdominal:     Palpations: Abdomen is soft.  Skin:    General: Skin is warm and dry.  Neurological:     General: No focal deficit present.     Mental Status: Alert and oriented to person, place and time.     Cranial Nerves: Cranial nerves are intact.       ASSESSMENT & PLAN:    1. Coronary artery disease involving native coronary artery of native heart with angina pectoris ([place]) S/p CABG in 2008, MI in 08/2019 tx with DES x to Radial graft to PDA, staged PCI with DES x 2 to a protected LM/LCx and inferolateral STEMI in 12/2019 tx with dES to the Radial graft to the PDA (ISR).  Overall his symptoms are stable to improved.  He does have symptoms in the evening hours, especially if he is more active during  the day.  We discussed taking Nitrates twice daily last time but he is still only taking it once daily.  I have suggested that we increase his Isosorbide to 60 mg in the AM and 30 mg in the PM.  Continue Rosuvastatin, Clopidogrel, Diltiazem, Ezetimibe, Metoprolol succinate.  FU with Dr. Burt Knack or me in 4 mos.  He knows to return sooner if his symptoms change.    2. Paroxysmal atrial fibrillation (HCC) Maintaining normal sinus rhythm.  Continue anticoagulation with Apixaban 5 mg twice daily.  Recent Hgb and Creatinine stable.   3. PAD (peripheral artery disease) ([place]) He has not had symptoms of claudication and the wound on his foot has healed.  He follows up with Dr. Donnetta Hutching with vascular surgery.   4. Essential hypertension The patient's blood pressure is controlled on his current regimen.  Continue current therapy.    5. Hyperlipidemia LDL goal <70 LDL remains above goal.  He sees the Lipid Clinic later today to consider PCSK9i Rx.  He notes issues with GI upset with Ezetimibe.  Hopefully, he will be able to get off of this if he is able to start a PCSK9i.       Dispo:  Return in about 4 months (around 11/25/2020).   Medication Adjustments/Labs and Tests Ordered: Current medicines are reviewed at length with the patient today.  Concerns regarding medicines are outlined above.  Tests Ordered: Orders Placed This Encounter  Procedures  . EKG 12-Lead   Medication Changes: Meds ordered this encounter  Medications  . isosorbide mononitrate (IMDUR) 30 MG 24 hr tablet    Sig: Take  1 tablet (30 mg total) by mouth every evening. 12 hours after AM dose of Isosorbide    Dispense:  90 tablet    Refill:  3    Order Specific Question:   Supervising Provider    Answer:   Lelon Perla [[id number]]    Signed, Richardson Dopp, PA-C  07/26/2020 4:20 PM    [facility] [place], [place],   [address] Phone: [phone]; Fax: [phone]

## 2020-07-26 NOTE — Progress Notes (Signed)
Patient ID: Ian [name]                 DOB: 01/12/1956                    MRN: [id number]     HPI: Ian [name] is a 64 y.o. male patient referred to lipid clinic by Dr. Burt Knack. PMH is significant for CAD s/p CABG (2008), HTN, STEMI (12/2019), STEMI s/p PCI (08/2019), unstable angina, PAD, OSA, DM, HLD.  Patient presents today in good spirits. Reports medication adherence with rosuvastatin 40 mg daily, ezetimibe 10 mg daily, Vascepa 2 g BID. Denies myalgia with rosuvastatin and ezetimibe, however reports stomach discomfort and diarrhea with Vascepa.   Current Medications: rosuvastatin 40 mg daily, ezetimibe 10 mg daily, Vascepa 2 g BID Intolerances: atorvastatin 80 mg daily, ezetimibe-simvastatin 10-80 mg daily, Niaspan 500 mg daily Risk Factors: multiple MI's, CAD, PAD, DM, HLD, HTN, unstable angina, Fhx of heart disease LDL goal: < 55 mg/dL  Diet:  Breakfast: cheerios Lunch: rarely eats Supper: restuarants, vegetables,  limits red meat  Drinks: water, tea, 1-2 cups of black coffee day  Exercise: walk 1 mile a day to sends home   Family History: Diabetes in his brother, brother, father, sister, and sister; Heart disease in his brother and father; Hypertension in his brother, father, sister, and son; Lymphoma in his brother.  Social History: Denies tobacco use  Labs: 07/15/20: LDL 81, TG 279, TC 152, HDL 25 (rosuvastatin, ezetimibe, Vascepa) 04/11/20: LDL 60, TG 321, TC 132, HDL 22 (rosuvastatin, ezetimibe) 01/04/20: LDL 58, TG 321, TC 143, HDL 21 (rosuvastatin, ezetimibe)  Past Medical History:  Diagnosis Date  . 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 Colon cancer ([place])   . Complication of anesthesia    atypical pseudo-cholinesterase deficiency  . Coronary artery disease 01/10/2013   s/p CABG 2008 // s/p STEMI in 08/2019>>DES x 2 to L radial-PDA; staged PCI: DES x 2 to protected LM into LCx // s/p inf-lat STEMI 12/2019 >> DES to L Radial-PDA  . Diabetes mellitus   .  Family Hx of adverse reaction to anesthesia    sister also has atypical pseudo-cholinesterase deficiency  . Gastroesophageal reflux disease   . History of heart bypass surgery   . HLD (hyperlipidemia)   . Hypertension   . Ischemic cardiomyopathy    Echo 01/04/2020: EF 45-50, mild LVH, Gr 2 DD, normal RVSF, mild to mod LAE, mild MR, mild AS (mean gradient 6 mmHg)  . Myocardial infarction ([place])   . PAD (peripheral artery disease) (HCC)    s/p R fem-pop bypass 02/2020 (Dr. Donnetta Hutching)  . Paroxysmal atrial fibrillation (HCC)    CHADS-VASc=3 // noted post op after fem pop bypass in 02/2020; due to high risk, Apixaban started    Current Outpatient Medications on File Prior to Visit  Medication Sig Dispense Refill  . apixaban (ELIQUIS) 5 MG TABS tablet Take 1 tablet (5 mg total) by mouth 2 (two) times daily. 60 tablet 5  . clopidogrel (PLAVIX) 75 MG tablet Take 1 tablet (75 mg total) by mouth daily. 90 tablet 2  . Coenzyme Q10 (COQ10) 100 MG CAPS Take 100 mg by mouth daily.    Marland Kitchen diltiazem (CARDIZEM CD) 360 MG 24 hr capsule Take 1 capsule (360 mg total) by mouth daily. 90 capsule 3  . diphenhydramine-acetaminophen (TYLENOL PM) 25-500 MG TABS tablet Take 2 tablets by mouth at bedtime.    Marland Kitchen ezetimibe (ZETIA) 10 MG tablet Take  1 tablet (10 mg total) by mouth daily. 30 tablet 1  . FARXIGA 10 MG TABS tablet Take 10 mg by mouth daily.     Marland Kitchen gabapentin (NEURONTIN) 300 MG capsule Take 300 mg by mouth at bedtime.    . hydrochlorothiazide (HYDRODIURIL) 25 MG tablet Take 1 tablet (25 mg total) by mouth daily. 90 tablet 3  . icosapent Ethyl (VASCEPA) 1 g capsule Take 2 capsules (2 g total) by mouth 2 (two) times daily. 120 capsule 4  . insulin aspart protamine- aspart (NOVOLOG MIX 70/30) (70-30) 100 UNIT/ML injection Inject 20 Units into the skin in the morning, at noon, and at bedtime.     . Insulin Glargine (BASAGLAR KWIKPEN) 100 UNIT/ML SOPN Inject 40 Units into the skin at bedtime.     . isosorbide mononitrate  (IMDUR) 60 MG 24 hr tablet Take 1 tablet (60 mg total) by mouth daily. 90 tablet 3  . JANUVIA 100 MG tablet Take 100 mg by mouth daily.     . metFORMIN (GLUCOPHAGE) 500 MG tablet Take 500 mg by mouth 2 (two) times daily.    . metoprolol succinate (TOPROL-XL) 100 MG 24 hr tablet Take 1 tablet (100 mg total) by mouth 2 (two) times daily. Take with or immediately following a meal. 60 tablet 1  . nitroGLYCERIN (NITROSTAT) 0.4 MG SL tablet Place 1 tablet (0.4 mg total) under the tongue every 5 (five) minutes x 3 doses as needed for chest pain. 25 tablet 2  . oxyCODONE (OXY IR/ROXICODONE) 5 MG immediate release tablet Take 1 tablet (5 mg total) by mouth every 6 (six) hours as needed for moderate pain. 30 tablet 0  . pantoprazole (PROTONIX) 40 MG tablet Take 40 mg by mouth daily.    . ramipril (ALTACE) 10 MG capsule Take 10 mg by mouth daily.    . rosuvastatin (CRESTOR) 40 MG tablet Take 1 tablet (40 mg total) by mouth at bedtime. 90 tablet 0   No current facility-administered medications on file prior to visit.    Allergies  Allergen Reactions  . Anectine [Succinylcholine] Other (See Comments)    Atypical pseudocholinesterase deficiency.   . Nsaids Other (See Comments)    Contraindicated (can only have asa 81mg )    Assessment/Plan:  1. Hyperlipidemia - LDL above goal < 55 mg/dL due to recurrent MI's with most recent in 12/2019 and significant CAD. Will start Repatha 140 mg/dL SQ every 2 weeks. Discussed the clinical benefits and proper administration technique with teach-back method. Prior authorization pending; once approved, will send prescription to pharmacy with copay card and contact patient once Repatha is ready for pick up. Instructed patent to continue rosuvastatin 40 mg daily and ezetimibe 10 mg daily. Counseled patient to take Vascepa with meals to reduce GI discomfort. Patient verbalized understanding. Encouraged patient to continue exercising and eating a healthy diet full of  vegetables, fruit and lean meats (chicken, [place], fish). Try to limit carbs (bread, pasta, sugar, rice) and red meat consumption . Will check fasting lipids and LFTs in 3 months.  Lorel Monaco, PharmD PGY2 [facility] [address], [place], [place] [address] Phone: [phone]; Fax: [phone]

## 2020-07-26 NOTE — Patient Instructions (Signed)
Medication Instructions:  Your physician has recommended you make the following change in your medication:   1) Continue Imdur 60 mg, 1 tablet by mouth every morning 2) Add Imdur 30 mg, 1 tablet by 12 hours after taking 60 mg  *If you need a refill on your cardiac medications before your next appointment, please call your pharmacy*  Lab Work: None ordered today  If you have labs (blood work) drawn today and your tests are completely normal, you will receive your results only by: Marland Kitchen MyChart Message (if you have MyChart) OR . A paper copy in the mail If you have any lab test that is abnormal or we need to change your treatment, we will call you to review the results.  Testing/Procedures: None ordered today  Follow-Up: 12/09/2020 at 2:20PM with Sherren Mocha, MD

## 2020-07-27 ENCOUNTER — Ambulatory Visit (HOSPITAL_COMMUNITY): Admission: RE | Admit: 2020-07-27 | Payer: Federal, State, Local not specified - PPO | Source: Ambulatory Visit

## 2020-08-01 ENCOUNTER — Ambulatory Visit: Payer: Federal, State, Local not specified - PPO | Admitting: Vascular Surgery

## 2020-08-02 ENCOUNTER — Telehealth: Payer: Self-pay | Admitting: Pharmacist

## 2020-08-02 ENCOUNTER — Ambulatory Visit (HOSPITAL_COMMUNITY)
Admission: RE | Admit: 2020-08-02 | Discharge: 2020-08-02 | Disposition: A | Discharge status: Home / Self Care | Payer: Federal, State, Local not specified - PPO | Source: Ambulatory Visit | Attending: Vascular Surgery | Admitting: Vascular Surgery

## 2020-08-02 ENCOUNTER — Other Ambulatory Visit: Payer: Self-pay

## 2020-08-02 DIAGNOSIS — E785 Hyperlipidemia, unspecified: Secondary | ICD-10-CM

## 2020-08-02 DIAGNOSIS — I739 Peripheral vascular disease, unspecified: Secondary | ICD-10-CM | POA: Diagnosis not present

## 2020-08-02 MED ORDER — REPATHA SURECLICK 140 MG/ML ~~LOC~~ SOAJ: 140.0000 mg | SUBCUTANEOUS | 11 refills | Status: DC

## 2020-08-02 MED ORDER — REPATHA SURECLICK 140 MG/ML ~~LOC~~ SOAJ
140.0000 mg | SUBCUTANEOUS | 11 refills | Status: AC
Start: 2020-08-02 — End: ?

## 2020-08-02 NOTE — Telephone Encounter (Signed)
Contacted patient to inform him Repatha copay is $5 per month and is ready for pick-up at his pharmacy. Instructed patient to inject Repatha into lower abdomen area every 2 weeks. Patient verbalized understanding. Scheduled fasting lipid panel and LFTs in 3 months.   Lorel Monaco, PharmD PGY2 Ambulatory Care Resident [place]

## 2020-08-15 ENCOUNTER — Encounter: Payer: Self-pay | Admitting: Vascular Surgery

## 2020-08-15 ENCOUNTER — Other Ambulatory Visit: Payer: Self-pay

## 2020-08-15 ENCOUNTER — Ambulatory Visit: Payer: Federal, State, Local not specified - PPO | Admitting: Vascular Surgery

## 2020-08-15 VITALS — BP 135/80 | HR 84 | Temp 97.9°F | Resp 16 | Ht 64.0 in | Wt 176.0 lb

## 2020-08-15 DIAGNOSIS — I739 Peripheral vascular disease, unspecified: Secondary | ICD-10-CM

## 2020-08-15 NOTE — Progress Notes (Signed)
[facility] [place]  Patient name: Ian [name] MRN: [id number] DOB: 06/19/1956 Sex: male  REASON FOR VISIT: Follow-up right femoral to popliteal bypass April 2021  HPI: Ian [name] is a 64 y.o. male here today for follow-up.  He had critical limb ischemia with ulceration on the tip of his right great toe.  He underwent uneventful right femoral to above-knee popliteal bypass with a saphenous vein.  He is done well with complete resolution of rest pain.  He is concerned that he is now having similar discomfort in his left foot.  He reports that this can awaken him at night and he does need to rub his foot for relief.  He reports that if he walks throughout the day he has less discomfort.  He does report that he has no claudication bilaterally.  He has had prior coronary bypass grafting with harvest of his saphenous vein from his left knee to his groin.  Past Medical History:  Diagnosis Date  . 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 Colon cancer ([place])   . Complication of anesthesia    atypical pseudo-cholinesterase deficiency  . Coronary artery disease 01/10/2013   s/p CABG 2008 // s/p STEMI in 08/2019>>DES x 2 to L radial-PDA; staged PCI: DES x 2 to protected LM into LCx // s/p inf-lat STEMI 12/2019 >> DES to L Radial-PDA  . Diabetes mellitus   . Family Hx of adverse reaction to anesthesia    sister also has atypical pseudo-cholinesterase deficiency  . Gastroesophageal reflux disease   . History of heart bypass surgery   . HLD (hyperlipidemia)   . Hypertension   . Ischemic cardiomyopathy    Echo 01/04/2020: EF 45-50, mild LVH, Gr 2 DD, normal RVSF, mild to mod LAE, mild MR, mild AS (mean gradient 6 mmHg)  . Myocardial infarction ([place])   . PAD (peripheral artery disease) ([facility])    s/p R fem-pop bypass 02/2020 (Dr. Donnetta Hutching)  . Paroxysmal atrial fibrillation ([facility])    CHADS-VASc=3 // noted post op after fem pop bypass in 02/2020;  due to high risk, Apixaban started    Family History  Problem Relation Age of Onset  . Hypertension Father   . Diabetes Father   . Heart disease Father   . Diabetes Sister   . Hypertension Sister   . Hypertension Brother   . Diabetes Brother   . Diabetes Sister   . Diabetes Brother   . Heart disease Brother   . Lymphoma Brother   . Hypertension Son     SOCIAL HISTORY: Social History   Tobacco Use  . Smoking status: Never Smoker  . Smokeless tobacco: Never Used  Substance Use Topics  . Alcohol use: No    Allergies  Allergen Reactions  . Anectine [Succinylcholine] Other (See Comments)    Atypical pseudocholinesterase deficiency.   . Nsaids Other (See Comments)    Contraindicated (can only have asa 81mg )    Current Outpatient Medications  Medication Sig Dispense Refill  . apixaban (ELIQUIS) 5 MG TABS tablet Take 1 tablet (5 mg total) by mouth 2 (two) times daily. 60 tablet 5  . Coenzyme Q10 (COQ10) 100 MG CAPS Take 100 mg by mouth daily.    Marland Kitchen diltiazem (CARDIZEM CD) 360 MG 24 hr capsule Take 1 capsule (360 mg total) by mouth daily. 90 capsule 3  . diphenhydramine-acetaminophen (TYLENOL PM) 25-500 MG TABS tablet Take 2 tablets by mouth at bedtime.    . Evolocumab (REPATHA SURECLICK)  140 MG/ML SOAJ Inject 140 mg into the skin every 14 (fourteen) days. 2 mL 11  . ezetimibe (ZETIA) 10 MG tablet Take 1 tablet (10 mg total) by mouth daily. 30 tablet 1  . FARXIGA 10 MG TABS tablet Take 10 mg by mouth daily.     Marland Kitchen gabapentin (NEURONTIN) 300 MG capsule Take 300 mg by mouth at bedtime.    . hydrochlorothiazide (HYDRODIURIL) 25 MG tablet Take 1 tablet (25 mg total) by mouth daily. 90 tablet 3  . icosapent Ethyl (VASCEPA) 1 g capsule Take 2 capsules (2 g total) by mouth 2 (two) times daily. 120 capsule 4  . insulin aspart protamine- aspart (NOVOLOG MIX 70/30) (70-30) 100 UNIT/ML injection Inject 20 Units into the skin in the morning, at noon, and at bedtime.     . Insulin Glargine  (BASAGLAR KWIKPEN) 100 UNIT/ML SOPN Inject 40 Units into the skin at bedtime.     . isosorbide mononitrate (IMDUR) 30 MG 24 hr tablet Take 1 tablet (30 mg total) by mouth every evening. 12 hours after AM dose of Isosorbide 90 tablet 3  . isosorbide mononitrate (IMDUR) 60 MG 24 hr tablet Take 1 tablet (60 mg total) by mouth daily. 90 tablet 3  . JANUVIA 100 MG tablet Take 100 mg by mouth daily.     . metFORMIN (GLUCOPHAGE) 500 MG tablet Take 500 mg by mouth 2 (two) times daily.    . metoprolol succinate (TOPROL-XL) 100 MG 24 hr tablet Take 1 tablet (100 mg total) by mouth 2 (two) times daily. Take with or immediately following a meal. 60 tablet 1  . nitroGLYCERIN (NITROSTAT) 0.4 MG SL tablet Place 1 tablet (0.4 mg total) under the tongue every 5 (five) minutes x 3 doses as needed for chest pain. 25 tablet 2  . oxyCODONE (OXY IR/ROXICODONE) 5 MG immediate release tablet Take 1 tablet (5 mg total) by mouth every 6 (six) hours as needed for moderate pain. 30 tablet 0  . pantoprazole (PROTONIX) 40 MG tablet Take 40 mg by mouth daily.    . ramipril (ALTACE) 10 MG capsule Take 10 mg by mouth daily.    . rosuvastatin (CRESTOR) 40 MG tablet Take 1 tablet (40 mg total) by mouth at bedtime. 90 tablet 0   No current facility-administered medications for this visit.    REVIEW OF SYSTEMS:  [X]  denotes positive finding, [ ]  denotes negative finding Cardiac  Comments:  Chest pain or chest pressure:    Shortness of breath upon exertion:    Short of breath when lying flat:    Irregular heart rhythm:        Vascular    Pain in calf, thigh, or hip brought on by ambulation:    Pain in feet at night that wakes you up from your sleep:  x   Blood clot in your veins:    Leg swelling:           PHYSICAL EXAM: Vitals:   08/15/20 0920  BP: 135/80  Pulse: 84  Resp: 16  Temp: 97.9 F (36.6 C)  TempSrc: Other (Comment)  SpO2: 98%  Weight: 176 lb (79.8 kg)  Height: 5\' 4"  (1.626 m)    GENERAL: The  patient is a well-nourished male, in no acute distress. The vital signs are documented above. CARDIOVASCULAR: 2+ radial pulses bilaterally.  2+ femoral pulses bilaterally.  I do not palpate left popliteal or pedal pulses.  He has easily palpable right popliteal pulse below the bypass and  2+ posterior tibial pulse on the right. PULMONARY: There is good air exchange  MUSCULOSKELETAL: There are no major deformities or cyanosis. NEUROLOGIC: No focal weakness or paresthesias are detected. SKIN: There are no ulcers or rashes noted. PSYCHIATRIC: The patient has a normal affect.  DATA:  Recent noninvasive studies reveal ankle arm index normal with normal triphasic waveforms on the right.  His ankle arm index is 0.9 on the left but monophasic waveform suggesting more significant disease  I did review his formal arteriogram from April 2021.  This showed complete occlusion of the superficial femoral artery at its origin on the left with reconstitution of above-knee popliteal with two-vessel runoff mainly through the dorsalis pedis  MEDICAL ISSUES: Continues to do very well regarding bypass.  No longer with critical limb ischemia on the right.  On the left he does have what sounds like [place]inn Kalmar rest pain.  He is able to tolerate this.  I again stressed the importance of foot care and he will notify us immediately should he develop any ulceration or other issues.  Otherwise he will see [place] again in 6 months with graft scan of his left femoropopliteal and ankle arm indices    Rosetta Posner, MD [facility] Tel [phone] Pager [phone]

## 2020-08-25 ENCOUNTER — Other Ambulatory Visit: Payer: Self-pay | Admitting: Cardiovascular Disease

## 2020-09-26 ENCOUNTER — Other Ambulatory Visit: Payer: Self-pay | Admitting: Physician Assistant

## 2020-09-28 ENCOUNTER — Other Ambulatory Visit: Payer: Self-pay

## 2020-09-28 ENCOUNTER — Other Ambulatory Visit (HOSPITAL_COMMUNITY): Payer: Self-pay | Admitting: *Deleted

## 2020-09-28 ENCOUNTER — Other Ambulatory Visit (HOSPITAL_COMMUNITY): Payer: Self-pay | Admitting: Internal Medicine

## 2020-09-28 ENCOUNTER — Ambulatory Visit (HOSPITAL_COMMUNITY)
Admission: RE | Admit: 2020-09-28 | Discharge: 2020-09-28 | Disposition: A | Discharge status: Home / Self Care | Payer: Federal, State, Local not specified - PPO | Source: Ambulatory Visit | Attending: Internal Medicine | Admitting: Internal Medicine

## 2020-09-28 DIAGNOSIS — J984 Other disorders of lung: Secondary | ICD-10-CM

## 2020-09-28 DIAGNOSIS — C189 Malignant neoplasm of colon, unspecified: Secondary | ICD-10-CM

## 2020-09-28 DIAGNOSIS — R160 Hepatomegaly, not elsewhere classified: Secondary | ICD-10-CM

## 2020-09-28 DIAGNOSIS — I10 Essential (primary) hypertension: Secondary | ICD-10-CM | POA: Diagnosis not present

## 2020-09-28 DIAGNOSIS — R1032 Left lower quadrant pain: Secondary | ICD-10-CM | POA: Diagnosis not present

## 2020-09-28 DIAGNOSIS — K219 Gastro-esophageal reflux disease without esophagitis: Secondary | ICD-10-CM | POA: Diagnosis not present

## 2020-09-28 DIAGNOSIS — Z6829 Body mass index (BMI) 29.0-29.9, adult: Secondary | ICD-10-CM | POA: Diagnosis not present

## 2020-09-28 DIAGNOSIS — R109 Unspecified abdominal pain: Secondary | ICD-10-CM | POA: Insufficient documentation

## 2020-09-28 DIAGNOSIS — C187 Malignant neoplasm of sigmoid colon: Secondary | ICD-10-CM

## 2020-09-28 DIAGNOSIS — C799 Secondary malignant neoplasm of unspecified site: Secondary | ICD-10-CM | POA: Diagnosis not present

## 2020-09-28 DIAGNOSIS — E118 Type 2 diabetes mellitus with unspecified complications: Secondary | ICD-10-CM | POA: Diagnosis not present

## 2020-09-28 LAB — POCT I-STAT CREATININE: Creatinine, Ser: 1 mg/dL (ref 0.61–1.24)

## 2020-09-28 MED ORDER — IOHEXOL 300 MG/ML  SOLN
100.0000 mL | Freq: Once | INTRAMUSCULAR | Status: AC | PRN
  Administered 2020-09-28: 100 mL via INTRAVENOUS

## 2020-09-28 MED ORDER — IOHEXOL 9 MG/ML PO SOLN
ORAL | Status: AC
  Filled 2020-09-28: qty 1000

## 2020-09-28 NOTE — Progress Notes (Signed)
Ct chest w contrast ordered and patient scheduled to see Dr. Delton Coombes.

## 2020-09-30 ENCOUNTER — Emergency Department (HOSPITAL_COMMUNITY): Payer: Federal, State, Local not specified - PPO

## 2020-09-30 ENCOUNTER — Observation Stay (HOSPITAL_BASED_OUTPATIENT_CLINIC_OR_DEPARTMENT_OTHER): Payer: Federal, State, Local not specified - PPO

## 2020-09-30 ENCOUNTER — Other Ambulatory Visit: Payer: Self-pay

## 2020-09-30 ENCOUNTER — Observation Stay (HOSPITAL_COMMUNITY)
Admission: EM | Admit: 2020-09-30 | Discharge: 2020-10-01 | Disposition: A | Discharge status: Home / Self Care | Payer: Federal, State, Local not specified - PPO | Attending: Internal Medicine | Admitting: Internal Medicine

## 2020-09-30 ENCOUNTER — Encounter (HOSPITAL_COMMUNITY): Payer: Self-pay | Admitting: Emergency Medicine

## 2020-09-30 DIAGNOSIS — I10 Essential (primary) hypertension: Secondary | ICD-10-CM | POA: Insufficient documentation

## 2020-09-30 DIAGNOSIS — C189 Malignant neoplasm of colon, unspecified: Secondary | ICD-10-CM

## 2020-09-30 DIAGNOSIS — I739 Peripheral vascular disease, unspecified: Secondary | ICD-10-CM

## 2020-09-30 DIAGNOSIS — I25110 Atherosclerotic heart disease of native coronary artery with unstable angina pectoris: Secondary | ICD-10-CM | POA: Diagnosis not present

## 2020-09-30 DIAGNOSIS — Z85038 Personal history of other malignant neoplasm of large intestine: Secondary | ICD-10-CM | POA: Diagnosis not present

## 2020-09-30 DIAGNOSIS — I340 Nonrheumatic mitral (valve) insufficiency: Secondary | ICD-10-CM

## 2020-09-30 DIAGNOSIS — E785 Hyperlipidemia, unspecified: Secondary | ICD-10-CM | POA: Diagnosis not present

## 2020-09-30 DIAGNOSIS — IMO0002 Reserved for concepts with insufficient information to code with codable children: Secondary | ICD-10-CM | POA: Diagnosis present

## 2020-09-30 DIAGNOSIS — C787 Secondary malignant neoplasm of liver and intrahepatic bile duct: Secondary | ICD-10-CM

## 2020-09-30 DIAGNOSIS — R079 Chest pain, unspecified: Secondary | ICD-10-CM

## 2020-09-30 DIAGNOSIS — Z794 Long term (current) use of insulin: Secondary | ICD-10-CM | POA: Diagnosis not present

## 2020-09-30 DIAGNOSIS — Z951 Presence of aortocoronary bypass graft: Secondary | ICD-10-CM | POA: Insufficient documentation

## 2020-09-30 DIAGNOSIS — Z79899 Other long term (current) drug therapy: Secondary | ICD-10-CM | POA: Insufficient documentation

## 2020-09-30 DIAGNOSIS — I200 Unstable angina: Secondary | ICD-10-CM | POA: Diagnosis not present

## 2020-09-30 DIAGNOSIS — J90 Pleural effusion, not elsewhere classified: Secondary | ICD-10-CM | POA: Diagnosis not present

## 2020-09-30 DIAGNOSIS — E1165 Type 2 diabetes mellitus with hyperglycemia: Secondary | ICD-10-CM

## 2020-09-30 DIAGNOSIS — Z7984 Long term (current) use of oral hypoglycemic drugs: Secondary | ICD-10-CM | POA: Insufficient documentation

## 2020-09-30 DIAGNOSIS — I209 Angina pectoris, unspecified: Secondary | ICD-10-CM | POA: Diagnosis not present

## 2020-09-30 DIAGNOSIS — E1169 Type 2 diabetes mellitus with other specified complication: Secondary | ICD-10-CM | POA: Diagnosis present

## 2020-09-30 DIAGNOSIS — E119 Type 2 diabetes mellitus without complications: Secondary | ICD-10-CM | POA: Diagnosis not present

## 2020-09-30 DIAGNOSIS — R160 Hepatomegaly, not elsewhere classified: Secondary | ICD-10-CM

## 2020-09-30 DIAGNOSIS — I480 Paroxysmal atrial fibrillation: Secondary | ICD-10-CM | POA: Diagnosis not present

## 2020-09-30 DIAGNOSIS — E11649 Type 2 diabetes mellitus with hypoglycemia without coma: Secondary | ICD-10-CM

## 2020-09-30 DIAGNOSIS — R072 Precordial pain: Secondary | ICD-10-CM | POA: Diagnosis not present

## 2020-09-30 DIAGNOSIS — E782 Mixed hyperlipidemia: Secondary | ICD-10-CM

## 2020-09-30 DIAGNOSIS — Z20822 Contact with and (suspected) exposure to covid-19: Secondary | ICD-10-CM | POA: Insufficient documentation

## 2020-09-30 DIAGNOSIS — E669 Obesity, unspecified: Secondary | ICD-10-CM | POA: Diagnosis present

## 2020-09-30 DIAGNOSIS — Z7901 Long term (current) use of anticoagulants: Secondary | ICD-10-CM | POA: Diagnosis not present

## 2020-09-30 HISTORY — DX: Nonrheumatic mitral (valve) insufficiency: I34.0

## 2020-09-30 HISTORY — DX: Nonrheumatic aortic (valve) stenosis: I35.0

## 2020-09-30 LAB — BASIC METABOLIC PANEL
Anion gap: 13 (ref 5–15)
BUN: 23 mg/dL (ref 8–23)
CO2: 24 mmol/L (ref 22–32)
Calcium: 9.3 mg/dL (ref 8.9–10.3)
Chloride: 96 mmol/L — ABNORMAL LOW (ref 98–111)
Creatinine, Ser: 1.17 mg/dL (ref 0.61–1.24)
GFR, Estimated: >60 mL/min (ref >60)
Glucose, Bld: 592 mg/dL (ref 70–99)
Potassium: 4 mmol/L (ref 3.5–5.1)
Sodium: 133 mmol/L — ABNORMAL LOW (ref 135–145)

## 2020-09-30 LAB — RESPIRATORY PANEL BY RT PCR (FLU A&B, COVID)
Influenza A by PCR: NEGATIVE
Influenza B by PCR: NEGATIVE
SARS Coronavirus 2 by RT PCR: NEGATIVE

## 2020-09-30 LAB — ECHOCARDIOGRAM COMPLETE
AR max vel: 2.32 cm2
AV Area VTI: 2.46 cm2
AV Area mean vel: 2.4 cm2
AV Mean grad: 11 mmHg
AV Peak grad: 18.1 mmHg
Ao pk vel: 2.13 m/s
Area-P 1/2: 4.8 cm2
Calc EF: 52.9 %
Height: 64 in
S' Lateral: 3.4 cm
Single Plane A2C EF: 50.1 %
Single Plane A4C EF: 56.9 %
Weight: 2768 oz

## 2020-09-30 LAB — CBC
HCT: 37.5 % — ABNORMAL LOW (ref 39.0–52.0)
Hemoglobin: 12 g/dL — ABNORMAL LOW (ref 13.0–17.0)
MCH: 25.6 pg — ABNORMAL LOW (ref 26.0–34.0)
MCHC: 32 g/dL (ref 30.0–36.0)
MCV: 80.1 fL (ref 80.0–100.0)
Platelets: 281 10*3/uL (ref 150–400)
RBC: 4.68 MIL/uL (ref 4.22–5.81)
RDW: 14.4 % (ref 11.5–15.5)
WBC: 11.3 10*3/uL — ABNORMAL HIGH (ref 4.0–10.5)
nRBC: 0 % (ref 0.0–0.2)

## 2020-09-30 LAB — TROPONIN I (HIGH SENSITIVITY)
Troponin I (High Sensitivity): 268 ng/L (ref <18)
Troponin I (High Sensitivity): 241 ng/L (ref <18)

## 2020-09-30 LAB — GLUCOSE, CAPILLARY
Glucose-Capillary: 371 mg/dL — ABNORMAL HIGH (ref 70–99)
Glucose-Capillary: 276 mg/dL — ABNORMAL HIGH (ref 70–99)

## 2020-09-30 LAB — HIV ANTIBODY (ROUTINE TESTING W REFLEX): HIV Screen 4th Generation wRfx: NONREACTIVE

## 2020-09-30 LAB — D-DIMER, QUANTITATIVE: D-Dimer, Quant: 1.74 ug/mL-FEU — ABNORMAL HIGH (ref 0.00–0.50)

## 2020-09-30 LAB — TSH: TSH: <0.01 u[IU]/mL — ABNORMAL LOW (ref 0.350–4.500)

## 2020-09-30 LAB — HEMOGLOBIN A1C
Hgb A1c MFr Bld: 13.4 % — ABNORMAL HIGH (ref 4.8–5.6)
Mean Plasma Glucose: 337.88 mg/dL

## 2020-09-30 MED ORDER — METOPROLOL SUCCINATE ER 100 MG PO TB24
100.0000 mg | ORAL_TABLET | Freq: Two times a day (BID) | ORAL | Status: DC
  Administered 2020-09-30 – 2020-10-01 (×2): 100 mg via ORAL
  Filled 2020-09-30 (×2): qty 1

## 2020-09-30 MED ORDER — ACETAMINOPHEN 500 MG PO TABS
500.0000 mg | ORAL_TABLET | Freq: Every day | ORAL | Status: DC
  Administered 2020-09-30: 500 mg via ORAL
  Filled 2020-09-30: qty 1

## 2020-09-30 MED ORDER — ROSUVASTATIN CALCIUM 20 MG PO TABS
40.0000 mg | ORAL_TABLET | Freq: Every day | ORAL | Status: DC
  Administered 2020-09-30: 40 mg via ORAL
  Filled 2020-09-30: qty 2

## 2020-09-30 MED ORDER — RAMIPRIL 10 MG PO CAPS
10.0000 mg | ORAL_CAPSULE | Freq: Every day | ORAL | Status: DC
  Administered 2020-10-01: 10 mg via ORAL
  Filled 2020-09-30: qty 1

## 2020-09-30 MED ORDER — ONDANSETRON HCL 4 MG PO TABS: 4.0000 mg | ORAL_TABLET | Freq: Four times a day (QID) | ORAL | Status: DC | PRN

## 2020-09-30 MED ORDER — ONDANSETRON HCL 4 MG/2ML IJ SOLN: 4.0000 mg | Freq: Four times a day (QID) | INTRAMUSCULAR | Status: DC | PRN

## 2020-09-30 MED ORDER — EZETIMIBE-SIMVASTATIN 10-80 MG PO TABS: 1.0000 | ORAL_TABLET | Freq: Every day | ORAL | Status: DC

## 2020-09-30 MED ORDER — ACETAMINOPHEN 650 MG RE SUPP: 650.0000 mg | Freq: Four times a day (QID) | RECTAL | Status: DC | PRN

## 2020-09-30 MED ORDER — DILTIAZEM HCL ER COATED BEADS 180 MG PO CP24
360.0000 mg | ORAL_CAPSULE | Freq: Every day | ORAL | Status: DC
  Administered 2020-10-01: 360 mg via ORAL
  Filled 2020-09-30: qty 2

## 2020-09-30 MED ORDER — APIXABAN 5 MG PO TABS: 5.0000 mg | ORAL_TABLET | Freq: Two times a day (BID) | ORAL | Status: DC

## 2020-09-30 MED ORDER — NITROGLYCERIN 0.4 MG SL SUBL: 0.4000 mg | SUBLINGUAL_TABLET | SUBLINGUAL | Status: DC | PRN

## 2020-09-30 MED ORDER — DAPAGLIFLOZIN PROPANEDIOL 10 MG PO TABS
10.0000 mg | ORAL_TABLET | Freq: Every day | ORAL | Status: DC
  Administered 2020-10-01: 10 mg via ORAL
  Filled 2020-09-30: qty 1

## 2020-09-30 MED ORDER — PERFLUTREN LIPID MICROSPHERE
1.0000 mL | INTRAVENOUS | Status: AC | PRN
  Administered 2020-09-30: 2 mL via INTRAVENOUS
  Filled 2020-09-30: qty 10

## 2020-09-30 MED ORDER — ISOSORBIDE MONONITRATE ER 60 MG PO TB24
60.0000 mg | ORAL_TABLET | Freq: Every day | ORAL | Status: DC
  Administered 2020-10-01: 60 mg via ORAL
  Filled 2020-09-30: qty 1

## 2020-09-30 MED ORDER — ICOSAPENT ETHYL 1 G PO CAPS
2.0000 g | ORAL_CAPSULE | Freq: Two times a day (BID) | ORAL | Status: DC
  Administered 2020-09-30 – 2020-10-01 (×2): 2 g via ORAL
  Filled 2020-09-30 (×3): qty 2

## 2020-09-30 MED ORDER — LINAGLIPTIN 5 MG PO TABS
5.0000 mg | ORAL_TABLET | Freq: Every day | ORAL | Status: DC
  Administered 2020-10-01: 5 mg via ORAL
  Filled 2020-09-30: qty 1

## 2020-09-30 MED ORDER — ACETAMINOPHEN 325 MG PO TABS: 650.0000 mg | ORAL_TABLET | Freq: Four times a day (QID) | ORAL | Status: DC | PRN

## 2020-09-30 MED ORDER — DIPHENHYDRAMINE HCL 25 MG PO CAPS
25.0000 mg | ORAL_CAPSULE | Freq: Every day | ORAL | Status: DC
  Administered 2020-09-30: 25 mg via ORAL
  Filled 2020-09-30: qty 1

## 2020-09-30 MED ORDER — BASAGLAR KWIKPEN 100 UNIT/ML ~~LOC~~ SOPN: 40.0000 [IU] | PEN_INJECTOR | Freq: Every day | SUBCUTANEOUS | Status: DC

## 2020-09-30 MED ORDER — INSULIN ASPART 100 UNIT/ML ~~LOC~~ SOLN
0.0000 [IU] | Freq: Three times a day (TID) | SUBCUTANEOUS | Status: DC
  Administered 2020-09-30: 15 [IU] via SUBCUTANEOUS
  Administered 2020-10-01 (×2): 8 [IU] via SUBCUTANEOUS

## 2020-09-30 MED ORDER — INSULIN GLARGINE 100 UNIT/ML ~~LOC~~ SOLN
40.0000 [IU] | Freq: Every day | SUBCUTANEOUS | Status: DC
  Administered 2020-09-30: 40 [IU] via SUBCUTANEOUS
  Filled 2020-09-30 (×3): qty 0.4

## 2020-09-30 MED ORDER — HEPARIN (PORCINE) 25000 UT/250ML-% IV SOLN
1200.0000 [IU]/h | INTRAVENOUS | Status: DC
  Administered 2020-09-30: 900 [IU]/h via INTRAVENOUS
  Filled 2020-09-30 (×2): qty 250

## 2020-09-30 MED ORDER — PANTOPRAZOLE SODIUM 40 MG PO TBEC
40.0000 mg | DELAYED_RELEASE_TABLET | Freq: Every day | ORAL | Status: DC
  Administered 2020-10-01: 40 mg via ORAL
  Filled 2020-09-30: qty 1

## 2020-09-30 MED ORDER — ASPIRIN EC 81 MG PO TBEC
81.0000 mg | DELAYED_RELEASE_TABLET | Freq: Every day | ORAL | Status: DC
  Administered 2020-10-01: 81 mg via ORAL
  Filled 2020-09-30: qty 1

## 2020-09-30 MED ORDER — HYDRALAZINE HCL 20 MG/ML IJ SOLN: 10.0000 mg | INTRAMUSCULAR | Status: DC | PRN

## 2020-09-30 MED ORDER — ISOSORBIDE MONONITRATE ER 30 MG PO TB24
30.0000 mg | ORAL_TABLET | Freq: Every evening | ORAL | Status: DC
  Administered 2020-09-30: 30 mg via ORAL
  Filled 2020-09-30: qty 1

## 2020-09-30 MED ORDER — HYDROCHLOROTHIAZIDE 25 MG PO TABS
25.0000 mg | ORAL_TABLET | Freq: Every day | ORAL | Status: DC
  Administered 2020-10-01: 25 mg via ORAL
  Filled 2020-09-30: qty 1

## 2020-09-30 MED ORDER — SODIUM CHLORIDE 0.9 % IV BOLUS
1000.0000 mL | Freq: Once | INTRAVENOUS | Status: AC
  Administered 2020-09-30: 1000 mL via INTRAVENOUS

## 2020-09-30 MED ORDER — DIPHENHYDRAMINE-APAP (SLEEP) 25-500 MG PO TABS: 2.0000 | ORAL_TABLET | Freq: Every day | ORAL | Status: DC

## 2020-09-30 MED ORDER — ASPIRIN 81 MG PO CHEW
324.0000 mg | CHEWABLE_TABLET | Freq: Once | ORAL | Status: AC
  Administered 2020-09-30: 324 mg via ORAL
  Filled 2020-09-30: qty 4

## 2020-09-30 MED ORDER — RANOLAZINE ER 500 MG PO TB12
500.0000 mg | ORAL_TABLET | Freq: Two times a day (BID) | ORAL | Status: DC
  Administered 2020-09-30 – 2020-10-01 (×2): 500 mg via ORAL
  Filled 2020-09-30 (×4): qty 1

## 2020-09-30 NOTE — H&P (Signed)
History and Physical    FREDERICH MONTILLA GYF:[id number] DOB: 1955-11-28 DOA: 09/30/2020  PCP: Sharilyn Sites, MD   Patient coming from: Home   Chief Complaint: chest pain   HPI: STALEY LUNZ is a 64 y.o. male with medical history significant of coronary artery disease status post bypass grafting and angioplasty, type 2 diabetes mellitus, hypertension, dyslipidemia, paroxysmal atrial fibrillation, peripheral vascular disease (s/p above-the-knee popliteal bypass), and GERD.  He also has history of colon cancer in 2019. His last coronary angiography and angioplasty was in February 2021, post a drug-eluting stent to PDA. After his coronary intervention he continued to have stable angina at home, relieved with nitroglycerin.  2 days ago he had a CT of the abdomen and pelvis as part of work-up for abdominal pain, it was positive for multiple new hepatic and pulmonary metastasis with peritoneal based soft tissue nodules in the mid abdomen consistent with metastasis.  Apparently he spent the whole day in the outpatient imaging center, and he has been not taking his insulin or his medications appropriately, over the last 2 days.  Yesterday he was walking at a normal pace over a flat surface when he experienced left precordial pain, sharp in nature, with no radiation, worse with movement and improved with SL nitroglycerin x1.  During the night while in bed he had a second episode of precordial chest pain, similar characteristics which improved nitroglycerin x1.  He was concerned and came last night to the hospital, while in the parking lot he decided to go back home since his pain was completely resolved. This morning he had recurrence of this precordial chest pain while moving around his home, he took nitroglycerinx1 and his pain slowly faded away. Because of recurrent and increased frequency chest pain, angina, he came this morning to the hospital.    ED Course: Patient chest pain-free,  hemodynamically stable, his troponin was 2068-241.  Cardiology was contacted and he was placed on intravenous heparin, referred for admission for evaluation.  Review of Systems:  1. General: No fevers, no chills, no weight gain or weight loss 2. ENT: No runny nose or sore throat, no hearing disturbances 3. Pulmonary: No dyspnea, cough, wheezing, or hemoptysis 4. Cardiovascular: Positive angina as mentioned in HPI, no claudication, lower extremity edema, pnd or orthopnea 5. Gastrointestinal: No nausea or vomiting, no diarrhea or constipation 6. Hematology: No easy bruisability or frequent infections 7. Urology: No dysuria, hematuria or increased urinary frequency 8. Dermatology: No rashes. 9. Neurology: No seizures or paresthesias 10. Musculoskeletal: No joint pain or deformities  Past Medical History:  Diagnosis Date  . 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 Colon cancer ([place])   . Complication of anesthesia    atypical pseudo-cholinesterase deficiency  . Coronary artery disease 01/10/2013   s/p CABG 2008 // s/p STEMI in 08/2019>>DES x 2 to L radial-PDA; staged PCI: DES x 2 to protected LM into LCx // s/p inf-lat STEMI 12/2019 >> DES to L Radial-PDA  . Diabetes mellitus   . Family Hx of adverse reaction to anesthesia    sister also has atypical pseudo-cholinesterase deficiency  . Gastroesophageal reflux disease   . History of heart bypass surgery   . HLD (hyperlipidemia)   . Hypertension   . Ischemic cardiomyopathy    Echo 01/04/2020: EF 45-50, mild LVH, Gr 2 DD, normal RVSF, mild to mod LAE, mild MR, mild AS (mean gradient 6 mmHg)  . Myocardial infarction ([place])   . PAD (peripheral artery disease) ([place])  s/p R fem-pop bypass 02/2020 (Dr. Donnetta Hutching)  . Paroxysmal atrial fibrillation (HCC)    CHADS-VASc=3 // noted post op after fem pop bypass in 02/2020; due to high risk, Apixaban started    Past Surgical History:  Procedure Laterality Date  . ABDOMINAL AORTOGRAM W/LOWER EXTREMITY  Bilateral 02/18/2020   Procedure: ABDOMINAL AORTOGRAM W/LOWER EXTREMITY;  Surgeon: Marty Heck, MD;  Location: [place] [facility];  Service: Cardiovascular;  Laterality: Bilateral;  . COLONOSCOPY N/A 08/29/2018   Procedure: COLONOSCOPY;  Surgeon: Daneil Dolin, MD;  [facility];  Service: Endoscopy;  Laterality: N/A;  9:30  . CORONARY ARTERY BYPASS GRAFT     4 vessels  . CORONARY ATHERECTOMY N/A 09/15/2019   Procedure: CORONARY ATHERECTOMY;  Surgeon: Sherren Mocha, MD;  Location: [place] [facility];  Service: Cardiovascular;  LM-LCx Atherectomy - DES PCI (*Protected LM)   . CORONARY STENT INTERVENTION N/A 09/15/2019   Procedure: CORONARY STENT INTERVENTION;  Surgeon: Sherren Mocha, MD;  Location: [place] [facility];; Successful orbital atherectomy, PTCA, and overlapping DES treatment of severe calcific stenosis in the left main and left circumflex using a 2.5x34 mm Resolute Onyx DES (left circumflex) and 3.0x30 mm Resolute Onyx DES (overlapped proximally in the LCx extending into left main)  . CORONARY STENT INTERVENTION N/A 01/04/2020   Procedure: CORONARY STENT INTERVENTION;  Surgeon: Sherren Mocha, MD;  Location: [place] [facility];  Service: Cardiovascular;  Laterality: N/A;  . CORONARY/GRAFT ACUTE MI REVASCULARIZATION N/A 09/14/2019   Procedure: Coronary/Graft Acute MI Revascularization;  Surgeon: Sherren Mocha, MD;  Location: [place] [facility];  Service: Cardiovascular;  Laterality: N/A;  . FEMORAL-POPLITEAL BYPASS GRAFT Right 02/25/2020   Procedure: FEMORAL-ABOVE KNEE POPLITEAL ARTERY BYPASS GRAFT with Saphenous vein;  Surgeon: Rosetta Posner, MD;  Location: [facility];  Service: Vascular;  Laterality: Right;  . heart bypass    . INSERTION OF MESH  02/07/2015   Procedure: INSERTION OF MESH;  Surgeon: Aviva Signs Md, MD;  Location: [facility];  Service: General;;  . INTRAVASCULAR ULTRASOUND/IVUS N/A 01/04/2020   Procedure: Intravascular Ultrasound/IVUS;  Surgeon: Sherren Mocha, MD;  Location: [place] [facility];  Service: Cardiovascular;  Laterality: N/A;  . LEFT HEART CATH AND CORONARY ANGIOGRAPHY N/A 09/14/2019   Procedure: LEFT HEART CATH AND CORONARY ANGIOGRAPHY;  Surgeon: Sherren Mocha, MD;  Location: [place] [facility];  Service: Cardiovascular;  Laterality: N/A;  . LEFT HEART CATH AND CORONARY ANGIOGRAPHY N/A 01/04/2020   Procedure: LEFT HEART CATH AND CORONARY ANGIOGRAPHY;  Surgeon: Sherren Mocha, MD;  Location: [place] [facility];  Service: Cardiovascular;  Laterality: N/A;  . PARTIAL COLECTOMY N/A 09/17/2018   Procedure: PARTIAL COLECTOMY WITH PARTIAL WEDGE RESECTION LIVER METASTASIS;  Surgeon: Aviva Signs, MD;  Location: [facility];  Service: General;  Laterality: N/A;  . POLYPECTOMY  08/29/2018   Procedure: POLYPECTOMY;  Surgeon: Daneil Dolin, MD;  [facility];  Service: Endoscopy;;  . PORTACATH PLACEMENT Left 10/10/2018   Procedure: INSERTION PORT-A-CATH (catheter in left subclavian);  Surgeon: Aviva Signs, MD;  Location: [facility];  Service: General;  Laterality: Left;  . UMBILICAL HERNIA REPAIR N/A 02/07/2015   Procedure: UMBILICAL HERNIORRHAPHY WITH MESH;  Surgeon: Aviva Signs Md, MD;  Location: [facility];  Service: General;  Laterality: N/A;     reports that he has never smoked. He has never used smokeless tobacco. He reports that he does not drink alcohol and does not use drugs.  Allergies  Allergen Reactions  . Anectine [  Succinylcholine] Other (See Comments)    Atypical pseudocholinesterase deficiency.   . Nsaids Other (See Comments)    Contraindicated (can only have asa 81mg )    Family History  Problem Relation Age of Onset  . Hypertension Father   . Diabetes Father   . Heart disease Father   . Diabetes Sister   . Hypertension Sister   . Hypertension Brother   . Diabetes Brother   . Diabetes Sister   . Diabetes Brother   . Heart disease Brother   . Lymphoma Brother   . Hypertension Son      Prior to  Admission medications   Medication Sig Start Date End Date Taking? Authorizing Provider  apixaban (ELIQUIS) 5 MG TABS tablet Take 1 tablet (5 mg total) by mouth 2 (two) times daily. 05/31/20  Yes Sherren Mocha, MD  Coenzyme Q10 (COQ10) 100 MG CAPS Take 100 mg by mouth daily.   Yes [provider]  diltiazem (CARDIZEM CD) 360 MG 24 hr capsule Take 1 capsule (360 mg total) by mouth daily. 04/01/20 03/27/21 Yes Sherren Mocha, MD  diphenhydramine-acetaminophen (TYLENOL PM) 25-500 MG TABS tablet Take 2 tablets by mouth at bedtime.   Yes [provider]  Evolocumab (REPATHA SURECLICK) 026 MG/ML SOAJ Inject 140 mg into the skin every 14 (fourteen) days. 08/02/20  Yes Sherren Mocha, MD  ezetimibe-simvastatin (VYTORIN) 10-80 MG tablet Take 1 tablet by mouth daily.  09/03/20  Yes [provider]  FARXIGA 10 MG TABS tablet Take 10 mg by mouth daily.  12/19/18  Yes [provider]  hydrochlorothiazide (HYDRODIURIL) 25 MG tablet Take 1 tablet (25 mg total) by mouth daily. 09/24/19  Yes Bhagat, Bhavinkumar, PA  icosapent Ethyl (VASCEPA) 1 g capsule Take 2 capsules (2 g total) by mouth 2 (two) times daily. 09/27/20  Yes Weaver, Scott T, PA-C  insulin aspart protamine- aspart (NOVOLOG MIX 70/30) (70-30) 100 UNIT/ML injection Inject 20 Units into the skin in the morning, at noon, and at bedtime.    Yes [provider]  Insulin Glargine (BASAGLAR KWIKPEN) 100 UNIT/ML SOPN Inject 40 Units into the skin at bedtime.  12/10/18  Yes [provider]  isosorbide mononitrate (IMDUR) 30 MG 24 hr tablet Take 1 tablet (30 mg total) by mouth every evening. 12 hours after AM dose of Isosorbide 07/26/20 07/26/21 Yes Weaver, Scott T, PA-C  isosorbide mononitrate (IMDUR) 60 MG 24 hr tablet Take 1 tablet (60 mg total) by mouth daily. 02/09/20  Yes Weaver, Scott T, PA-C  JANUVIA 100 MG tablet Take 100 mg by mouth daily.  12/19/18  Yes [provider]  metFORMIN (GLUCOPHAGE) 500 MG  tablet Take 500 mg by mouth 2 (two) times daily. 08/27/19  Yes [provider]  metoprolol succinate (TOPROL-XL) 100 MG 24 hr tablet Take 1 tablet (100 mg total) by mouth 2 (two) times daily. Take with or immediately following a meal. 09/16/19  Yes Reino Bellis B, NP  nitroGLYCERIN (NITROSTAT) 0.4 MG SL tablet PLACE 1 TAB UNDER TONGUE EVERY 5 MIN IF NEEDED FOR CHEST PAIN. MAY USE 3 TIMES.NO RELIEF CALL 911. Patient taking differently: Place 0.4 mg under the tongue every 5 (five) minutes as needed for chest pain.  08/25/20  Yes Sherren Mocha, MD  pantoprazole (PROTONIX) 40 MG tablet Take 40 mg by mouth daily.   Yes [provider]  ramipril (ALTACE) 10 MG capsule Take 10 mg by mouth daily.   Yes [provider]  rosuvastatin (CRESTOR) 40 MG tablet Take  1 tablet (40 mg total) by mouth at bedtime. 01/06/20  Yes Cheryln Manly, NP  ezetimibe (ZETIA) 10 MG tablet Take 1 tablet (10 mg total) by mouth daily. Patient not taking: Reported on 09/30/2020 09/17/19   Reino Bellis B, NP  oxyCODONE (OXY IR/ROXICODONE) 5 MG immediate release tablet Take 1 tablet (5 mg total) by mouth every 6 (six) hours as needed for moderate pain. Patient not taking: Reported on 09/30/2020 02/28/20   Dagoberto Ligas, PA-C    Physical Exam: Vitals:   09/30/20 1119 09/30/20 1130 09/30/20 1200 09/30/20 1230  BP: (!) 155/75 (!) 127/102 (!) 150/74 (!) 143/69  Pulse: (!) 110 (!) 107 (!) 106 (!) 101  Resp: (!) 22 (!) 22 20 18   Temp:      TempSrc:      SpO2: 100% 99% 99% 100%  Weight:      Height:        Vitals:   09/30/20 1119 09/30/20 1130 09/30/20 1200 09/30/20 1230  BP: (!) 155/75 (!) 127/102 (!) 150/74 (!) 143/69  Pulse: (!) 110 (!) 107 (!) 106 (!) 101  Resp: (!) 22 (!) 22 20 18   Temp:      TempSrc:      SpO2: 100% 99% 99% 100%  Weight:      Height:       General: Not in pain or dyspnea Neurology: Awake and alert, non focal Head and Neck. Head normocephalic. Neck supple with  no adenopathy or thyromegaly.   E ENT: mild pallor, no icterus, oral mucosa moist Cardiovascular: No JVD. S1-S2 present, rhythmic, no gallops, rubs, or murmurs. No lower extremity edema. Pulmonary: positive breath sounds bilaterally, with no wheezing, rhonchi or rales. Gastrointestinal. Abdomen distended, tender to palpation at the left side, with no rebound or guarding. Skin. No rashes Musculoskeletal: no joint deformities    Labs on Admission: I have personally reviewed following labs and imaging studies  CBC: Recent Labs  Lab 09/30/20 1007  WBC 11.3*  HGB 12.0*  HCT 37.5*  MCV 80.1  PLT 366   Basic Metabolic Panel: Recent Labs  Lab 09/28/20 1300 09/30/20 1007  NA  --  133*  K  --  4.0  CL  --  96*  CO2  --  24  GLUCOSE  --  592*  BUN  --  23  CREATININE 1.00 1.17  CALCIUM  --  9.3   GFR: Estimated Creatinine Clearance: 61.2 mL/min (by C-G formula based on SCr of 1.17 mg/dL). Liver Function Tests: No results for input(s): AST, ALT, ALKPHOS, BILITOT, PROT, ALBUMIN in the last 168 hours. No results for input(s): LIPASE, AMYLASE in the last 168 hours. No results for input(s): AMMONIA in the last 168 hours. Coagulation Profile: No results for input(s): INR, PROTIME in the last 168 hours. Cardiac Enzymes: No results for input(s): CKTOTAL, CKMB, CKMBINDEX, TROPONINI in the last 168 hours. BNP (last 3 results) No results for input(s): PROBNP in the last 8760 hours. HbA1C: No results for input(s): HGBA1C in the last 72 hours. CBG: No results for input(s): GLUCAP in the last 168 hours. Lipid Profile: No results for input(s): CHOL, HDL, LDLCALC, TRIG, CHOLHDL, LDLDIRECT in the last 72 hours. Thyroid Function Tests: No results for input(s): TSH, T4TOTAL, FREET4, T3FREE, THYROIDAB in the last 72 hours. Anemia Panel: No results for input(s): VITAMINB12, FOLATE, FERRITIN, TIBC, IRON, RETICCTPCT in the last 72 hours. Urine analysis:    Component Value Date/Time    COLORURINE STRAW (A) 02/22/2020 2947  APPEARANCEUR CLEAR 02/22/2020 0634   LABSPEC 1.020 02/22/2020 0634   PHURINE 5.0 02/22/2020 0634   GLUCOSEU >=500 (A) 02/22/2020 0634   HGBUR NEGATIVE 02/22/2020 [place] NEGATIVE 02/22/2020 [place] 02/22/2020 0634   PROTEINUR 30 (A) 02/22/2020 0634   NITRITE NEGATIVE 02/22/2020 0634   LEUKOCYTESUR NEGATIVE 02/22/2020 0634    Radiological Exams on Admission: DG Chest 2 View  Result Date: 09/30/2020 CLINICAL DATA:  Chest pain EXAM: CHEST - 2 VIEW COMPARISON:  01/03/2019 FINDINGS: Cardiac shadow is within normal limits. Postoperative changes are seen. Left chest wall port is again noted and stable. Aortic calcifications are seen. The lungs are well aerated bilaterally. No focal infiltrate or sizable effusion is seen. No acute bony abnormality is noted. IMPRESSION: No acute abnormality noted. Electronically Signed   By: Inez Catalina M.D.   On: 09/30/2020 10:27   CT ABDOMEN PELVIS W CONTRAST  Result Date: 09/28/2020 CLINICAL DATA:  Side abdominal pain for 2 days, history of colon cancer surgery in 2019, prior chemotherapy; past history coronary artery disease post MI, diabetes mellitus, hypertension, ischemic cardiomyopathy EXAM: CT ABDOMEN AND PELVIS WITH CONTRAST TECHNIQUE: Multidetector CT imaging of the abdomen and pelvis was performed using the standard protocol following bolus administration of intravenous contrast. Sagittal and coronal MPR images reconstructed from axial data set. CONTRAST:  157mL OMNIPAQUE IOHEXOL 300 MG/ML SOLN IV. Dilute oral contrast. COMPARISON:  03/21/2007 FINDINGS: Lower chest: Bibasilar pulmonary nodules highly suspicious for metastatic disease, new, nodules up to 6 mm diameter. Minimal bibasilar atelectasis. Hepatobiliary: Gallbladder unremarkable. No biliary dilatation. Multiple ill-defined hepatic masses consistent with metastatic disease. Largest lesions measure 3.8 x 3.7 cm anterior RIGHT lobe  image 21, 3.3 x 1.9 cm inferior RIGHT lobe image 30, and 2.7 x 2.1 cm superiorly LEFT lobe image 14. Pancreas: Normal appearance Spleen: Normal appearance Adrenals/Urinary Tract: Adrenal glands, kidneys, and ureters normal appearance. Mild anterior wall bladder wall thickening, question artifact related underdistention though cystitis and less likely tumor not completely excluded. Stomach/Bowel: Few uncomplicated distal colonic diverticula. Prior segmental resection LEFT:. Stomach and bowel loops otherwise normal appearance. Normal appendix. Vascular/Lymphatic: Atherosclerotic calcifications aorta including visceral artery origins, iliac arteries, femoral arteries. Aorta normal caliber. Scattered normal sized mesenteric and retroperitoneal lymph nodes. Reproductive: Unremarkable prostate gland and seminal vesicles Other: Tumor nodules are seen throughout the mid abdomen consistent with peritoneal metastases. Largest of these is 4.0 x 2.7 cm anterior RIGHT mid abdomen image 51. Next largest lesion is in lateral LEFT upper pelvis 2.1 x 1.8 cm. No ascites. No free air. Musculoskeletal: BILATERAL pars defects L5 without listhesis. No definite osseous metastatic lesions identified. IMPRESSION: Multiple new hepatic and pulmonary metastases. Peritoneal based soft tissue nodules in the mid abdomen consistent with metastases. Mild anterior bladder wall thickening, question artifact related underdistention though cystitis and less likely tumor not completely excluded; recommend correlation with urinalysis. Minimal distal colonic diverticulosis without evidence of diverticulitis. Aortic Atherosclerosis (ICD10-I70.0). Findings called to Dr. Gerarda Fraction on 09/28/2020 at 1421 hrs. Electronically Signed   By: Lavonia Dana M.D.   On: 09/28/2020 14:23    EKG: Independently reviewed.  111 bpm, left axis deviation, normal intervals, sinus rhythm, J-point elevation V1-V2, ST depressions V5-V6, no significant T wave changes. (Unchanged  07/2020)  Assessment/Plan Principal Problem:   Unstable angina (HCC) Active Problems:   Obesity   Essential hypertension   Coronary artery disease involving native coronary artery of native heart with unstable angina pectoris ([place])   Hyperlipidemia associated with type  2 diabetes mellitus ([place])   Diabetes mellitus type II, uncontrolled ([place])   Liver mass, right lobe   PAD (peripheral artery disease) (HCC)   Mixed hyperlipidemia   64 year old male with significant atherosclerotic vascular disease who presents with increased frequency of anginal symptoms.  He has been noncompliant with medications for the last 48 hours.  Recently diagnosed with liver lesions likely malignant.  On his initial physical examination blood pressure 127/102, heart rate 107, respiratory rate 22, oxygen saturation 99%.  Lungs clear to auscultation, heart S1-S2, present rhythmic, soft abdomen, tender to palpation left lower quadrant, no rebound, no lower extremity edema. Sodium 133, potassium 4.0, chloride 96, bicarb 24, glucose 592, BUN 23, creatinine 1.17, troponin I 268-241, white count 11.3, hemoglobin 12.0, hematocrit 37.5, platelets 281.  D-dimer 1.74. SARS COVID-19 negative.  Chest radiograph no infiltrates, positive atelectasis right base, Port-A-Cath in place.   1.  Unstable angina.  Patient describes increased frequency of his typical anginal symptoms, consistent with unstable angina.  It is likely that this has been triggered by lack of compliance with medical therapy including poor glucose control.  Patient has been placed on heparin drip per cardiology recommendations, continue blood pressure control with diltiazem, ramipril and metoprolol.  Continue isosorbide.  On aspirin 81 mg and ranolazine.    Follow-up cardiac enzymes, echocardiogram and further recommendations from cardiology.  2.  Uncontrolled hypertension.  Continue metoprolol, hctz, isosorbide, ramipril and diltiazem. Add as needed hydralazine  intravenously for systolic blood pressure greater than 190.  If persistent uncontrolled hypertension may need to adjust oral regimen.  3.  Uncontrolled type II that is mellitus, hyperglycemia. dyslipidmemia  Resume insulin therapy, basal 40 units glargine and sliding scale. Continue with dapagliflozin, and linagliptin.   Continue with ezetimibe and rosuvastatin  4.  Newly diagnosed liver lesions, possible recurrence of malignancy.  Continue pain control, follow-up as an outpatient with oncology.  5.  Paroxysmal atrial fibrillation.  Continue rate control with metoprolol and diltiazem, anticoagulation with heparin.  6. CKD stage 2. Stable renal function with cr at 1,17, K at 4.0 and bicarbonate at 24.  Continue close follow up on renal function and electrolytes.   7. Elevated D dimer.  Clinical picture suggestive of angina, will do ultrasonography lower extremities as initial work-up, if negative and patient has persistent symptoms may need CT angiography.  Note patient has been on anticoagulation with apixaban which makes pulmonary embolism less likely.    Status is: Observation  The patient remains OBS appropriate and will d/c before 2 midnights.  Dispo: The patient is from: Home              Anticipated d/c is to: Home              Anticipated d/c date is: 1 day              Patient currently is not medically stable to d/c.    DVT prophylaxis: Heparin   Code Status:   full  Family Communication:  I spoke with patient's wife at the bedside, we talked in detail about patient's condition, plan of care and prognosis and all questions were addressed.     Consults called:  Cardiology   Admission status:  Observation    Roan Sawchuk Gerome Apley MD [facility]   09/30/2020, 12:56 PM

## 2020-09-30 NOTE — Progress Notes (Signed)
ANTICOAGULATION CONSULT NOTE - Initial Consult  Pharmacy Consult for heparin Indication: chest pain/ACS  Allergies  Allergen Reactions  . Anectine [Succinylcholine] Other (See Comments)    Atypical pseudocholinesterase deficiency.   . Nsaids Other (See Comments)    Contraindicated (can only have asa 81mg )    Patient Measurements: Height: 5\' 4"  (162.6 cm) Weight: 78.5 kg (173 lb) IBW/kg (Calculated) : 59.2 Heparin Dosing Weight: 75.3kg  Vital Signs: Temp: 97.4 F (36.3 C) (11/12 1003) Temp Source: Oral (11/12 1003) BP: 150/74 (11/12 1200) Pulse Rate: 106 (11/12 1200)  Labs: Recent Labs    09/28/20 1300 09/30/20 1007  HGB  --  12.0*  HCT  --  37.5*  PLT  --  281  CREATININE 1.00 1.17  TROPONINIHS  --  268*    Estimated Creatinine Clearance: 61.2 mL/min (by C-G formula based on SCr of 1.17 mg/dL).   Medical History: Past Medical History:  Diagnosis Date  . 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 Colon cancer ([place])   . Complication of anesthesia    atypical pseudo-cholinesterase deficiency  . Coronary artery disease 01/10/2013   s/p CABG 2008 // s/p STEMI in 08/2019>>DES x 2 to L radial-PDA; staged PCI: DES x 2 to protected LM into LCx // s/p inf-lat STEMI 12/2019 >> DES to L Radial-PDA  . Diabetes mellitus   . Family Hx of adverse reaction to anesthesia    sister also has atypical pseudo-cholinesterase deficiency  . Gastroesophageal reflux disease   . History of heart bypass surgery   . HLD (hyperlipidemia)   . Hypertension   . Ischemic cardiomyopathy    Echo 01/04/2020: EF 45-50, mild LVH, Gr 2 DD, normal RVSF, mild to mod LAE, mild MR, mild AS (mean gradient 6 mmHg)  . Myocardial infarction ([place])   . PAD (peripheral artery disease) (HCC)    s/p R fem-pop bypass 02/2020 (Dr. Donnetta Hutching)  . Paroxysmal atrial fibrillation (HCC)    CHADS-VASc=3 // noted post op after fem pop bypass in 02/2020; due to high risk, Apixaban started   Assessment: 35 YOM presenting with  CP and numbness in left arm, troponin elevated.  On Eliquis PTA for afib, last dose taken today ~0900.  H/H 12/37.5 (chronic), plts wnl  Goal of Therapy:  Heparin level 0.3-0.7 units/ml aPTT 66-102 seconds Monitor platelets by anticoagulation protocol: Yes   Plan:  Heparin gtt at 900 units/hr ~12h from last dose, no bolus F/u 6 hour aPTT/heparin level F/u cards plan  Bertis Ruddy, PharmD Clinical Pharmacist ED Pharmacist Phone # [phone] 09/30/2020 12:23 PM

## 2020-09-30 NOTE — Consult Note (Addendum)
Cardiology Consultation:   Patient ID: KRZYSZTOF REICHELT MRN: [id number]; DOB: 28-Sep-1956  Admit date: 09/30/2020 Date of Consult: 09/30/2020  Primary Care Provider: Sharilyn Sites, MD [facility] Cardiologist: Sherren Mocha, MD  [facility] Electrophysiologist:  None    Patient Profile:   Ian [name] is a 64 y.o. male with a hx of CAD s/p CABG 2008, STEMI 08/2019 s/p DES x2 to L radial-PDA with DES x 2 to protected LM into LCx, inf-lat STEMI 12/2019 s/p DES to L radial-PDA, DM, GERD, HTN, HLD, ICM, PAD s/p R fempop BPG 02/2020, PAF (noted after FPBPG), colon CA (prior mets to liver s/p partial colectomy and chemotherapy with recent concern for new hepatic/pulmonary metastatic disease) who is being seen today for the evaluation of chest pain at the request of Dr. Ron Parker.  History of Present Illness:   The patient is followed by Dr. Burt Knack with history outlined above. His last cath was at time of MI 12/2019 showing findings outlined below with aorto-ostial stenosis of the radial graft to PDA treated with PCI/DES. 2D echo at that time had shown EF 45-50%, grade 2 DD, mild-moderate LAE, mild AS/MR. He has a history of chest pain/elevated troponin in the past in the context of tachycardia in 02/2020 (noted before fempop bypass grafting) which improved with resumption of home medications including beta blocker. At baseline he has chronic angina with any exertion or stress above ADLs and has to take a SL NTG approximately every 3 days. Prior to last night he had not seen any change in this pattern.  This past week he developed LLQ pain so saw his primary care. He originally thought perhaps he had a bladder infection. Outpatient CT 09/28/20 showed multiple new hepatic and pulmonary metastases, peritoneal based soft tissue nodules in mid abdomen consistent with metastases, mild anterior bladder wall thickening. Referral placed to return to oncology. Since he was feeling bad and with increased  emotional stress he stopped taking his insulin. Last night he developed worsening stuttering chest pain lasting a few minutes at a time. Over the course of several hours he required 7 SL NTG. He and his wife came to the ER and sat in the parking lot but symptoms resolved so they did not come in. This morning his pain recurred requiring additional SL NTG so he came to the ER for evaluation. He was tachycardic with marked hyperglycemia. He was given 324mg  ASA and 1L fluid bolus with resolution in chest pain. He does continue to complain of LLQ tenderness with palpation. Blood sugar showed CBG 592, sodium 133 (likely pseudohyponatremia), Cr 1.17, leukocytosis, mild anemia with Hgb 12, hsTroponin 268->241, d-dimer 1.74. CXR NAD. EKG shows sinus tachycardia 111bpm, left axis deviation, minimal critieria for LVH, diffuse STT changes with inferior ST depression as well as ST depression V5-V6, similar to 07/2020 (slightly accentuated inferiorly today). He otherwise reports compliance with meds and took all his home meds this AM including Eliquis.  He otherwise denies any awareness that anything was changing in his body. No recent edema, orthopnea, palpitations. No associated SOB or diaphoresis with this chest pain. He shares that his 2 grandchildren have been hoping he could help build a treehouse soon.    Past Medical History:  Diagnosis Date  . 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 Colon cancer ([place])   . Complication of anesthesia    atypical pseudo-cholinesterase deficiency  . Coronary artery disease 01/10/2013   s/p CABG 2008 // s/p STEMI in 08/2019>>DES x 2 to L  radial-PDA; staged PCI: DES x 2 to protected LM into LCx // s/p inf-lat STEMI 12/2019 >> DES to L Radial-PDA  . Diabetes mellitus   . Family Hx of adverse reaction to anesthesia    sister also has atypical pseudo-cholinesterase deficiency  . Gastroesophageal reflux disease   . History of heart bypass surgery   . HLD (hyperlipidemia)   .  Hypertension   . Ischemic cardiomyopathy    Echo 01/04/2020: EF 45-50, mild LVH, Gr 2 DD, normal RVSF, mild to mod LAE, mild MR, mild AS (mean gradient 6 mmHg)  . Mild aortic stenosis   . Mild mitral regurgitation   . Myocardial infarction ([place])   . PAD (peripheral artery disease) ([facility])    s/p R fem-pop bypass 02/2020 (Dr. Donnetta Hutching)  . Paroxysmal atrial fibrillation ([facility])    CHADS-VASc=3 // noted post op after fem pop bypass in 02/2020; due to high risk, Apixaban started    Past Surgical History:  Procedure Laterality Date  . ABDOMINAL AORTOGRAM W/LOWER EXTREMITY Bilateral 02/18/2020   Procedure: ABDOMINAL AORTOGRAM W/LOWER EXTREMITY;  Surgeon: Marty Heck, MD;  Location: [place] [facility];  Service: Cardiovascular;  Laterality: Bilateral;  . COLONOSCOPY N/A 08/29/2018   Procedure: COLONOSCOPY;  Surgeon: Daneil Dolin, MD;  [facility];  Service: Endoscopy;  Laterality: N/A;  9:30  . CORONARY ARTERY BYPASS GRAFT     4 vessels  . CORONARY ATHERECTOMY N/A 09/15/2019   Procedure: CORONARY ATHERECTOMY;  Surgeon: Sherren Mocha, MD;  Location: [place] [facility];  Service: Cardiovascular;  LM-LCx Atherectomy - DES PCI (*Protected LM)   . CORONARY STENT INTERVENTION N/A 09/15/2019   Procedure: CORONARY STENT INTERVENTION;  Surgeon: Sherren Mocha, MD;  Location: [place] [facility];; Successful orbital atherectomy, PTCA, and overlapping DES treatment of severe calcific stenosis in the left main and left circumflex using a 2.5x34 mm Resolute Onyx DES (left circumflex) and 3.0x30 mm Resolute Onyx DES (overlapped proximally in the LCx extending into left main)  . CORONARY STENT INTERVENTION N/A 01/04/2020   Procedure: CORONARY STENT INTERVENTION;  Surgeon: Sherren Mocha, MD;  Location: [place] [facility];  Service: Cardiovascular;  Laterality: N/A;  . CORONARY/GRAFT ACUTE MI REVASCULARIZATION N/A 09/14/2019   Procedure: Coronary/Graft Acute MI Revascularization;  Surgeon: Sherren Mocha, MD;  Location: [place] [facility];  Service: Cardiovascular;  Laterality: N/A;  . FEMORAL-POPLITEAL BYPASS GRAFT Right 02/25/2020   Procedure: FEMORAL-ABOVE KNEE POPLITEAL ARTERY BYPASS GRAFT with Saphenous vein;  Surgeon: Rosetta Posner, MD;  Location: [facility];  Service: Vascular;  Laterality: Right;  . heart bypass    . INSERTION OF MESH  02/07/2015   Procedure: INSERTION OF MESH;  Surgeon: Aviva Signs Md, MD;  Location: [facility];  Service: General;;  . INTRAVASCULAR ULTRASOUND/IVUS N/A 01/04/2020   Procedure: Intravascular Ultrasound/IVUS;  Surgeon: Sherren Mocha, MD;  Location: [place] [facility];  Service: Cardiovascular;  Laterality: N/A;  . LEFT HEART CATH AND CORONARY ANGIOGRAPHY N/A 09/14/2019   Procedure: LEFT HEART CATH AND CORONARY ANGIOGRAPHY;  Surgeon: Sherren Mocha, MD;  Location: [place] [facility];  Service: Cardiovascular;  Laterality: N/A;  . LEFT HEART CATH AND CORONARY ANGIOGRAPHY N/A 01/04/2020   Procedure: LEFT HEART CATH AND CORONARY ANGIOGRAPHY;  Surgeon: Sherren Mocha, MD;  Location: [place] [facility];  Service: Cardiovascular;  Laterality: N/A;  . PARTIAL COLECTOMY N/A 09/17/2018   Procedure: PARTIAL COLECTOMY WITH PARTIAL WEDGE RESECTION LIVER METASTASIS;  Surgeon: Aviva Signs, MD;  Location: [facility];  Service: General;  Laterality: N/A;  . POLYPECTOMY  08/29/2018   Procedure: POLYPECTOMY;  Surgeon: Daneil Dolin, MD;  [facility];  Service: Endoscopy;;  . PORTACATH PLACEMENT Left 10/10/2018   Procedure: INSERTION PORT-A-CATH (catheter in left subclavian);  Surgeon: Aviva Signs, MD;  Location: [facility];  Service: General;  Laterality: Left;  . UMBILICAL HERNIA REPAIR N/A 02/07/2015   Procedure: UMBILICAL HERNIORRHAPHY WITH MESH;  Surgeon: Aviva Signs Md, MD;  Location: [facility];  Service: General;  Laterality: N/A;     Home Medications:  Prior to Admission medications   Medication Sig Start Date End Date Taking? Authorizing Provider  apixaban  (ELIQUIS) 5 MG TABS tablet Take 1 tablet (5 mg total) by mouth 2 (two) times daily. 05/31/20  Yes Sherren Mocha, MD  Coenzyme Q10 (COQ10) 100 MG CAPS Take 100 mg by mouth daily.   Yes [provider]  diltiazem (CARDIZEM CD) 360 MG 24 hr capsule Take 1 capsule (360 mg total) by mouth daily. 04/01/20 03/27/21 Yes Sherren Mocha, MD  diphenhydramine-acetaminophen (TYLENOL PM) 25-500 MG TABS tablet Take 2 tablets by mouth at bedtime.   Yes [provider]  Evolocumab (REPATHA SURECLICK) 637 MG/ML SOAJ Inject 140 mg into the skin every 14 (fourteen) days. 08/02/20  Yes Sherren Mocha, MD  ezetimibe-simvastatin (VYTORIN) 10-80 MG tablet Take 1 tablet by mouth daily.  09/03/20  Yes [provider]  FARXIGA 10 MG TABS tablet Take 10 mg by mouth daily.  12/19/18  Yes [provider]  hydrochlorothiazide (HYDRODIURIL) 25 MG tablet Take 1 tablet (25 mg total) by mouth daily. 09/24/19  Yes Bhagat, Bhavinkumar, PA  icosapent Ethyl (VASCEPA) 1 g capsule Take 2 capsules (2 g total) by mouth 2 (two) times daily. 09/27/20  Yes Weaver, Scott T, PA-C  insulin aspart protamine- aspart (NOVOLOG MIX 70/30) (70-30) 100 UNIT/ML injection Inject 20 Units into the skin in the morning, at noon, and at bedtime.    Yes [provider]  Insulin Glargine (BASAGLAR KWIKPEN) 100 UNIT/ML SOPN Inject 40 Units into the skin at bedtime.  12/10/18  Yes [provider]  isosorbide mononitrate (IMDUR) 30 MG 24 hr tablet Take 1 tablet (30 mg total) by mouth every evening. 12 hours after AM dose of Isosorbide 07/26/20 07/26/21 Yes Weaver, Scott T, PA-C  isosorbide mononitrate (IMDUR) 60 MG 24 hr tablet Take 1 tablet (60 mg total) by mouth daily. 02/09/20  Yes Weaver, Scott T, PA-C  JANUVIA 100 MG tablet Take 100 mg by mouth daily.  12/19/18  Yes [provider]  metFORMIN (GLUCOPHAGE) 500 MG tablet Take 500 mg by mouth 2 (two) times daily. 08/27/19  Yes [provider]  metoprolol  succinate (TOPROL-XL) 100 MG 24 hr tablet Take 1 tablet (100 mg total) by mouth 2 (two) times daily. Take with or immediately following a meal. 09/16/19  Yes Reino Bellis B, NP  nitroGLYCERIN (NITROSTAT) 0.4 MG SL tablet PLACE 1 TAB UNDER TONGUE EVERY 5 MIN IF NEEDED FOR CHEST PAIN. MAY USE 3 TIMES.NO RELIEF CALL 911. Patient taking differently: Place 0.4 mg under the tongue every 5 (five) minutes as needed for chest pain.  08/25/20  Yes Sherren Mocha, MD  pantoprazole (PROTONIX) 40 MG tablet Take 40 mg by mouth daily.   Yes [provider]  ramipril (ALTACE) 10 MG capsule Take 10 mg by mouth daily.   Yes [provider]  rosuvastatin (CRESTOR) 40 MG tablet Take 1 tablet (40 mg total) by mouth at  bedtime. 01/06/20  Yes Cheryln Manly, NP  ezetimibe (ZETIA) 10 MG tablet Take 1 tablet (10 mg total) by mouth daily. Patient not taking: Reported on 09/30/2020 09/17/19   Reino Bellis B, NP  oxyCODONE (OXY IR/ROXICODONE) 5 MG immediate release tablet Take 1 tablet (5 mg total) by mouth every 6 (six) hours as needed for moderate pain. Patient not taking: Reported on 09/30/2020 02/28/20   Dagoberto Ligas, PA-C    Inpatient Medications: Scheduled Meds: . diphenhydrAMINE  25 mg Oral QHS   And  . acetaminophen  500 mg Oral QHS  . [START ON 10/01/2020] aspirin EC  81 mg Oral Daily  . [START ON 10/01/2020] dapagliflozin propanediol  10 mg Oral Daily  . diltiazem  360 mg Oral Daily  . hydrochlorothiazide  25 mg Oral Daily  . icosapent Ethyl  2 g Oral BID  . isosorbide mononitrate  30 mg Oral QPM  . isosorbide mononitrate  60 mg Oral Daily  . metoprolol succinate  100 mg Oral BID  . pantoprazole  40 mg Oral Daily  . [START ON 10/01/2020] ramipril  10 mg Oral Daily  . rosuvastatin  40 mg Oral QHS   Continuous Infusions: . heparin     PRN Meds:   Allergies:    Allergies  Allergen Reactions  . Anectine [Succinylcholine] Other (See Comments)    Atypical  pseudocholinesterase deficiency.   . Nsaids Other (See Comments)    Contraindicated (can only have asa 81mg )    Social History:   Social History   Socioeconomic History  . Marital status: Married    Spouse name: Not on file  . Number of children: 3  . Years of education: Not on file  . Highest education level: Not on file  Occupational History  . Occupation: Programmer, systems: [employer]  Tobacco Use  . Smoking status: Never Smoker  . Smokeless tobacco: Never Used  Vaping Use  . Vaping Use: Never used  Substance and Sexual Activity  . Alcohol use: No  . Drug use: No  . Sexual activity: Yes    Birth control/protection: None  Other Topics Concern  . Not on file  Social History Narrative  . Not on file   Social Determinants of Health   Financial Resource Strain:   . Difficulty of Paying Living Expenses: Not on file  Food Insecurity:   . Worried About Charity fundraiser in the Last Year: Not on file  . Ran Out of Food in the Last Year: Not on file  Transportation Needs:   . Lack of Transportation (Medical): Not on file  . Lack of Transportation (Non-Medical): Not on file  Physical Activity:   . Days of Exercise per Week: Not on file  . Minutes of Exercise per Session: Not on file  Stress:   . Feeling of Stress : Not on file  Social Connections:   . Frequency of Communication with Friends and Family: Not on file  . Frequency of Social Gatherings with Friends and Family: Not on file  . Attends Religious Services: Not on file  . Active Member of Clubs or Organizations: Not on file  . Attends Archivist Meetings: Not on file  . Marital Status: Not on file  Intimate Partner Violence:   . Fear of Current or Ex-Partner: Not on file  . Emotionally Abused: Not on file  . Physically Abused: Not on file  . Sexually Abused: Not on file  Family History:   Family History  Problem Relation Age of Onset  . Hypertension Father   . Diabetes  Father   . Heart disease Father   . Diabetes Sister   . Hypertension Sister   . Hypertension Brother   . Diabetes Brother   . Diabetes Sister   . Diabetes Brother   . Heart disease Brother   . Lymphoma Brother   . Hypertension Son      ROS:  Please see the history of present illness.  No fevers or chills. All other ROS reviewed and negative.     Physical Exam/Data:   Vitals:   09/30/20 1230 09/30/20 1300 09/30/20 1330 09/30/20 1400  BP: (!) 143/69 (!) 153/72 (!) 148/65 (!) 120/56  Pulse: (!) 101 96 97 81  Resp: 18 (!) 21 (!) 21 (!) 22  Temp:      TempSrc:      SpO2: 100% 99% 100% 97%  Weight:      Height:        Intake/Output Summary (Last 24 hours) at 09/30/2020 1405 Last data filed at 09/30/2020 1300 Gross per 24 hour  Intake 1000 ml  Output --  Net 1000 ml   Last 3 Weights 09/30/2020 08/15/2020 07/26/2020  Weight (lbs) 173 lb 176 lb 181 lb  Weight (kg) 78.472 kg 79.833 kg 82.101 kg     Body mass index is 29.7 kg/m.  General: Well developed, well nourished WM, in no acute distress. Head: Normocephalic, atraumatic, sclera non-icteric, no xanthomas, nares are without discharge. Neck: Negative for carotid bruits. JVP not elevated. Lungs: Clear bilaterally to auscultation without wheezes, rales, or rhonchi. Breathing is unlabored. Heart: RRR S1 S2. Soft short SEM RUSB without rubs or gallops.  Abdomen: Soft, non-tender, non-distended with normoactive bowel sounds. +LLQ tenderness with moderate palpation.  Extremities: No clubbing or cyanosis. No edema. Distal pedal pulses are 2+ and equal bilaterally. Neuro: Alert and oriented X 3. Moves all extremities spontaneously. Psych:  Responds to questions appropriately with a normal affect.   EKG:  The EKG was personally reviewed and demonstrates:  sinus tachycardia 111bpm, left axis deviation, minimal critieria for LVH, diffuse STT changes with inferior ST depression as well as ST depression V5-V6, similar to 07/2020  (slightly accentuated inferiorly today)  Telemetry:  Telemetry was personally reviewed and demonstrates:  NSR/borderline sinus tach  Relevant CV Studies: LHC 12/2019 1.  Severe multivessel coronary artery disease with chronic total occlusion of the LAD and RCA, continued patency of the left mainstem and left circumflex stents with focal mild to moderate in-stent restenosis at the transition of the left main into the circumflex. 2.  Status post aortocoronary bypass surgery with continued patency of the LIMA to LAD graft and severe aorto ostial stenosis of the radial graft to PDA treated successfully with PCI using a 3.0 x 30 mm resolute Onyx DES with intravascular ultrasound guidance 3.  Nonobstructive ostial left circumflex restenosis as demonstrated by intravascular ultrasound with a minimal lumen area of 5.96 mm  Recommendations: Long-term dual antiplatelet therapy with aspirin and Effient, continued aggressive medical therapy, 2D echocardiogram for assessment of LV systolic function.  2D echo 12/2019 1. Left ventricular ejection fraction, by estimation, is 45 to 50%. The  left ventricle has mildly decreased function. The left ventricle  demonstrates global hypokinesis. There is mild concentric left ventricular  hypertrophy. Left ventricular diastolic  parameters are consistent with Grade II diastolic dysfunction  (pseudonormalization). Elevated left atrial pressure.  2. Right ventricular systolic function  is normal. The right ventricular  size is normal. Tricuspid regurgitation signal is inadequate for assessing  PA pressure.  3. Left atrial size was mild to moderately dilated.  4. The mitral valve is degenerative. Mild mitral valve regurgitation.  5. The aortic valve is tricuspid. Aortic valve regurgitation is not  visualized. Mild aortic valve stenosis.  6. The inferior vena cava is dilated in size with >50% respiratory  variability, suggesting right atrial pressure of 8 mmHg.    Comparison(s): A prior study was performed on 09/14/2019. Prior images  reviewed side by side. Changes from prior study are noted. The left  ventricular function is worsened.       Laboratory Data:  High Sensitivity Troponin:   Recent Labs  Lab 09/30/20 1007 09/30/20 1155  TROPONINIHS 268* 241*     Chemistry Recent Labs  Lab 09/28/20 1300 09/30/20 1007  NA  --  133*  K  --  4.0  CL  --  96*  CO2  --  24  GLUCOSE  --  592*  BUN  --  23  CREATININE 1.00 1.17  CALCIUM  --  9.3  GFRNONAA  --  >60  ANIONGAP  --  13    No results for input(s): PROT, ALBUMIN, AST, ALT, ALKPHOS, BILITOT in the last 168 hours. Hematology Recent Labs  Lab 09/30/20 1007  WBC 11.3*  RBC 4.68  HGB 12.0*  HCT 37.5*  MCV 80.1  MCH 25.6*  MCHC 32.0  RDW 14.4  PLT 281   BNPNo results for input(s): BNP, PROBNP in the last 168 hours.  DDimer  Recent Labs  Lab 09/30/20 1155  DDIMER 1.74*     Radiology/Studies:  DG Chest 2 View  Result Date: 09/30/2020 CLINICAL DATA:  Chest pain EXAM: CHEST - 2 VIEW COMPARISON:  01/03/2019 FINDINGS: Cardiac shadow is within normal limits. Postoperative changes are seen. Left chest wall port is again noted and stable. Aortic calcifications are seen. The lungs are well aerated bilaterally. No focal infiltrate or sizable effusion is seen. No acute bony abnormality is noted. IMPRESSION: No acute abnormality noted. Electronically Signed   By: Inez Catalina M.D.   On: 09/30/2020 10:27   CT ABDOMEN PELVIS W CONTRAST  Result Date: 09/28/2020 CLINICAL DATA:  Side abdominal pain for 2 days, history of colon cancer surgery in 2019, prior chemotherapy; past history coronary artery disease post MI, diabetes mellitus, hypertension, ischemic cardiomyopathy EXAM: CT ABDOMEN AND PELVIS WITH CONTRAST TECHNIQUE: Multidetector CT imaging of the abdomen and pelvis was performed using the standard protocol following bolus administration of intravenous contrast. Sagittal and  coronal MPR images reconstructed from axial data set. CONTRAST:  117mL OMNIPAQUE IOHEXOL 300 MG/ML SOLN IV. Dilute oral contrast. COMPARISON:  03/21/2007 FINDINGS: Lower chest: Bibasilar pulmonary nodules highly suspicious for metastatic disease, new, nodules up to 6 mm diameter. Minimal bibasilar atelectasis. Hepatobiliary: Gallbladder unremarkable. No biliary dilatation. Multiple ill-defined hepatic masses consistent with metastatic disease. Largest lesions measure 3.8 x 3.7 cm anterior RIGHT lobe image 21, 3.3 x 1.9 cm inferior RIGHT lobe image 30, and 2.7 x 2.1 cm superiorly LEFT lobe image 14. Pancreas: Normal appearance Spleen: Normal appearance Adrenals/Urinary Tract: Adrenal glands, kidneys, and ureters normal appearance. Mild anterior wall bladder wall thickening, question artifact related underdistention though cystitis and less likely tumor not completely excluded. Stomach/Bowel: Few uncomplicated distal colonic diverticula. Prior segmental resection LEFT:. Stomach and bowel loops otherwise normal appearance. Normal appendix. Vascular/Lymphatic: Atherosclerotic calcifications aorta including visceral artery origins, iliac arteries, femoral  arteries. Aorta normal caliber. Scattered normal sized mesenteric and retroperitoneal lymph nodes. Reproductive: Unremarkable prostate gland and seminal vesicles Other: Tumor nodules are seen throughout the mid abdomen consistent with peritoneal metastases. Largest of these is 4.0 x 2.7 cm anterior RIGHT mid abdomen image 51. Next largest lesion is in lateral LEFT upper pelvis 2.1 x 1.8 cm. No ascites. No free air. Musculoskeletal: BILATERAL pars defects L5 without listhesis. No definite osseous metastatic lesions identified. IMPRESSION: Multiple new hepatic and pulmonary metastases. Peritoneal based soft tissue nodules in the mid abdomen consistent with metastases. Mild anterior bladder wall thickening, question artifact related underdistention though cystitis and less  likely tumor not completely excluded; recommend correlation with urinalysis. Minimal distal colonic diverticulosis without evidence of diverticulitis. Aortic Atherosclerosis (ICD10-I70.0). Findings called to Dr. Gerarda Fraction on 09/28/2020 at 1421 hrs. Electronically Signed   By: Lavonia Dana M.D.   On: 09/28/2020 14:23     Assessment and Plan:   1. Acceleration of known angina superimposed on CAD s/p prior CABG, PCIs as above, with elevated troponin - at baseline, the patient has chronic angina precipitated with any exertion beyond ADLs and also has history of worsening chest pain in the context of tachycardia - presents with worsening chest pain, elevated but flat troponin in the context of physiologic stressors of sinus tachycardia, marked hyperglycemia, and LLQ pain with underlying new metastatic disease - elevated troponin may reflect demand ischemia vs NSTEMI - agree with heparin for now pending discussion with cardiologist - plan ASA 81mg  daily for now - continue metoprolol - continue Imdur (60mg  QAM/30mg  QPM) - can consider titration - re: lipids - unclear why Vytorin is on list, will discontinue (especially given simvastatin interaction with diltiazem). Continue Crestor. Zetia caused GI issues. PCSK9 authorization underway as OP - per preliminary discussion with MD, trial of Ranexa 500mg  BID - follow QT  2. Worsening hyperglycemia  - admits to not taking insulin in the context of multiple stressors - may be driving metabolic demand - appreciate IM management  3. Interval development of new hepatic, pulmonary and peritoneal soft tissue nodules (with prior history of colon CA) - consider input from oncology while he is bridged from anticoagulant to heparin - d-dimer elevated which is nonspecific in CA; will defer to primary team whether to pursue eval for PE  4. Ischemic cardiomyopathy - volume status looks OK - f/u echocardiogram  5. Mild AS/MR - f/u echo  6. Paroxysmal atrial  fibrillation (in NSR) - Eliquis on hold for heparin for now - continue BB/CCB   7. HTN - follow BP with home regimen - add TSH given tachycardia      TIMI Risk Score for Unstable Angina or Non-ST Elevation MI:   The patient's TIMI risk score is 5, which indicates a 26% risk of all cause mortality, new or recurrent myocardial infarction or need for urgent revascularization in the next 14 days.  New York Heart Association (NYHA) Functional Class NYHA Class II   CHA2DS2-VASc Score = 4  This indicates a 4.8% annual risk of stroke. The patient's score is based upon: CHF History: 1 HTN History: 1 Diabetes History: 1 Stroke History: 0 Vascular Disease History: 1 Age Score: 0 Gender Score: 0     For questions or updates, please contact [place] Please consult [email or web address] for contact info under    Signed, Charlie Pitter, PA-C  09/30/2020 2:05 PM   Patient seen and examined. Agree with assessment and plan.  Ian [name] is a 64 year old  gentleman who has known CAD and underwent CABG revascularization in 2008 with a LIMA to LAD, left radial graft to PDA and 2 vein grafts to his OM1 and OM 2 vessels.  He status post stenting of his left main and native circumflex.  At last catheterization he was found to have ostial stenosis in the radial graft which had previously had several stents in place and a new stent was added.  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  He has been treated with aggressive antianginal medical regimen however he continues to have at least class II anginal symptoms.  He has a history of PVD and is status post femoropopliteal bypass graft surgery.  He also has a history of colon CA and was recently found to have new hepatic and pulmonary metastatic disease.  He has had increased emotional stress and in this period of increased stress he has noticed chest pain with improvement  following sublingual nitroglycerin.  He presented to the emergency room this morning after he required additional nitroglycerin for chest pain relief and was tachycardic with marked hyperglycemia.  He was treated with fluid bolus, initial blood sugar was 592.  D-dimers were mildly positive suggesting more demand ischemia at 268 and 241.  He has been on Eliquis for anticoagulation.  Presently, he is pain-free in the emergency room.  He has been on diltiazem 360 mg daily, hydrochlorothiazide thiazide 25 mg, isosorbide 60 mg in the morning and 30 mg in the evening in addition to metoprolol succinate 100 mg twice a day.  His blood pressure is currently controlled at 120/56.  Pulse is in the 80s.  HEENT was unremarkable.  There was no JVD.  His lungs were clear.  There was no chest wall tenderness to palpation.  Rhythm was regular with a 1/6 systolic murmur along the right upper sternal border.  There was mild left lower quadrant tenderness to palpation.  There was no significant edema.  Distal pulses were adequate.  Neurologic exam was grossly nonfocal.  He had a normal affect and mood.  ECG reveals sinus tachycardia at 111, diffuse ST-T changes inferiorly and in V5 and V6, left axis deviation, not significantly changed from September 2021.  QTc interval 459 ms.  He has been documented to have an EF in the 45 to 50% range on his last echo in February 2021 and had mild concentric LVH with grade 2 diastolic dysfunction.  With his chronic anginal symptomatology I have recommended the addition of ranolazine to start at 500 mg twice a day which should provide added anti-ischemic benefit to his current medical regimen.  This dose can be further titrated in several weeks to 1000 mg twice a day for optimal therapy.  Can also consider slight titration of isosorbide to 90 mg in the morning and 30 mg at night as blood pressure allows.  I suspect his recent chest pain was contributed by his sinus tachycardia, hyperglycemia which  has resolved with volume replacement and control of heart rate.  He presently is in sinus rhythm although has a history of PAF for which he has been on anticoagulation.  Continue with rosuvastatin for hyperlipidemia.   Troy Sine, MD, [facility] 09/30/2020 3:16 PM

## 2020-09-30 NOTE — Progress Notes (Signed)
  Echocardiogram 2D Echocardiogram has been performed.  Ian [name] 09/30/2020, 3:47 PM

## 2020-09-30 NOTE — ED Triage Notes (Signed)
Pt endorses left sided and central chest pain since last night with numbness to left arm. States pain is 1/10 now. Has taken around 7 nitro in total since last night. Hx of 2 MI

## 2020-09-30 NOTE — Progress Notes (Signed)
Inpatient Diabetes Program Recommendations  AACE/ADA: New Consensus Statement on Inpatient Glycemic Control (2015)  Target Ranges:  Prepandial:   less than 140 mg/dL      Peak postprandial:   less than 180 mg/dL (1-2 hours)      Critically ill patients:  140 - 180 mg/dL   Lab Results  Component Value Date   GLUCAP 166 (H) 02/28/2020   HGBA1C 9.8 (H) 01/04/2020    Review of Glycemic Control Results for Ian [name], Ian [name] (MRN [id number]) as of 09/30/2020 15:04  Ref. Range 09/30/2020 10:07  Glucose Latest Ref Range: 70 - 99 mg/dL 592 (HH)   Diabetes history: Type 2 DM Outpatient Diabetes medications: Farxiga 10 mg QD, Novolog 70/30 20 units TID, Basaglar 40 units QHS, Januvia 100 mg QD, Metformin 500 mg BID Current orders for Inpatient glycemic control: Farxiga 10 mg QD  Inpatient Diabetes Program Recommendations:    Noted glucose serum 592 mg/dL and patient to be admitted.  Consider starting IV insulin for hyperglycemia.  Secure chat sent to MD.   Thanks, Bronson Curb, MSN, RNC-OB Diabetes Coordinator [phone] (8a-5p)

## 2020-09-30 NOTE — ED Provider Notes (Signed)
[facility] EMERGENCY DEPARTMENT Provider Note   CSN: [id number] Arrival date & time: 09/30/20  1000     History Chief Complaint  Patient presents with  . Chest Pain    Ian [name] is a 64 y.o. male.   Chest Pain Pain location:  Substernal area Pain quality: crushing and pressure   Pain radiates to:  Does not radiate Onset quality:  Gradual Timing:  Intermittent Progression:  Waxing and waning Chronicity:  Recurrent Context: at rest   Relieved by:  Nitroglycerin Worsened by:  Nothing Ineffective treatments:  None tried Associated symptoms: no back pain, no cough, no fever, no headache, no nausea, no palpitations, no shortness of breath and no vomiting   Risk factors: coronary artery disease        Past Medical History:  Diagnosis Date  . 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 Colon cancer ([place])   . Complication of anesthesia    atypical pseudo-cholinesterase deficiency  . Coronary artery disease 01/10/2013   s/p CABG 2008 // s/p STEMI in 08/2019>>DES x 2 to L radial-PDA; staged PCI: DES x 2 to protected LM into LCx // s/p inf-lat STEMI 12/2019 >> DES to L Radial-PDA  . Diabetes mellitus   . Family Hx of adverse reaction to anesthesia    sister also has atypical pseudo-cholinesterase deficiency  . Gastroesophageal reflux disease   . History of heart bypass surgery   . HLD (hyperlipidemia)   . Hypertension   . Ischemic cardiomyopathy    Echo 01/04/2020: EF 45-50, mild LVH, Gr 2 DD, normal RVSF, mild to mod LAE, mild MR, mild AS (mean gradient 6 mmHg)  . Mild aortic stenosis   . Mild mitral regurgitation   . Myocardial infarction ([place])   . PAD (peripheral artery disease) (HCC)    s/p R fem-pop bypass 02/2020 (Dr. Donnetta Hutching)  . Paroxysmal atrial fibrillation (HCC)    CHADS-VASc=3 // noted post op after fem pop bypass in 02/2020; due to high risk, Apixaban started    Patient Active Problem List   Diagnosis Date Noted  . Mixed hyperlipidemia  07/26/2020  . PAD (peripheral artery disease) ([place]) 02/25/2020  . Chest pain 02/22/2020  . ST elevation myocardial infarction (STEMI) of inferolateral wall ([place])   . Unstable angina ([place]) 01/04/2020  . Acute coronary syndrome ([place])   . Hx of CABG   . Presence of drug-eluting stent in RCA, LM-LCx 09/15/2019  . Non-ST elevation (NSTEMI) myocardial infarction ([place]) 09/14/2019  . NSTEMI (non-ST elevated myocardial infarction) ([place]) 09/14/2019  . S/P partial colectomy 09/17/2018  . Malignant neoplasm of sigmoid colon ([place])   . Liver mass, right lobe   . Dysfunctional ventilatory weaning response ([place]) 02/07/2015  . Palpitations, recurrent 01/10/2013  . Obesity 01/10/2013  . Essential hypertension 01/10/2013  . Coronary artery disease involving native coronary artery of native heart with unstable angina pectoris ([place]) 01/10/2013  . Hyperlipidemia associated with type 2 diabetes mellitus ([place]) 01/10/2013  . OSA (obstructive sleep apnea), probable 01/10/2013  . Diabetes mellitus type II, uncontrolled ([place]) 01/10/2013  . 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 Chest tightness 01/10/2013    Past Surgical History:  Procedure Laterality Date  . ABDOMINAL AORTOGRAM W/LOWER EXTREMITY Bilateral 02/18/2020   Procedure: ABDOMINAL AORTOGRAM W/LOWER EXTREMITY;  Surgeon: Marty Heck, MD;  Location: [facility];  Service: Cardiovascular;  Laterality: Bilateral;  . COLONOSCOPY N/A 08/29/2018   Procedure: COLONOSCOPY;  Surgeon: Daneil Dolin, MD;  [facility];  Service:  Endoscopy;  Laterality: N/A;  9:30  . CORONARY ARTERY BYPASS GRAFT     4 vessels  . CORONARY ATHERECTOMY N/A 09/15/2019   Procedure: CORONARY ATHERECTOMY;  Surgeon: Sherren Mocha, MD;  Location: [facility];  Service: Cardiovascular;  LM-LCx Atherectomy - DES PCI (*Protected LM)   . CORONARY STENT INTERVENTION N/A 09/15/2019   Procedure: CORONARY STENT INTERVENTION;  Surgeon: Sherren Mocha, MD;   Location: [facility];; Successful orbital atherectomy, PTCA, and overlapping DES treatment of severe calcific stenosis in the left main and left circumflex using a 2.5x34 mm Resolute Onyx DES (left circumflex) and 3.0x30 mm Resolute Onyx DES (overlapped proximally in the LCx extending into left main)  . CORONARY STENT INTERVENTION N/A 01/04/2020   Procedure: CORONARY STENT INTERVENTION;  Surgeon: Sherren Mocha, MD;  Location: [facility];  Service: Cardiovascular;  Laterality: N/A;  . CORONARY/GRAFT ACUTE MI REVASCULARIZATION N/A 09/14/2019   Procedure: Coronary/Graft Acute MI Revascularization;  Surgeon: Sherren Mocha, MD;  Location: [facility];  Service: Cardiovascular;  Laterality: N/A;  . FEMORAL-POPLITEAL BYPASS GRAFT Right 02/25/2020   Procedure: FEMORAL-ABOVE KNEE POPLITEAL ARTERY BYPASS GRAFT with Saphenous vein;  Surgeon: Rosetta Posner, MD;  Location: [facility];  Service: Vascular;  Laterality: Right;  . heart bypass    . INSERTION OF MESH  02/07/2015   Procedure: INSERTION OF MESH;  Surgeon: Aviva Signs Md, MD;  Location: [facility];  Service: General;;  . INTRAVASCULAR ULTRASOUND/IVUS N/A 01/04/2020   Procedure: Intravascular Ultrasound/IVUS;  Surgeon: Sherren Mocha, MD;  Location: [facility];  Service: Cardiovascular;  Laterality: N/A;  . LEFT HEART CATH AND CORONARY ANGIOGRAPHY N/A 09/14/2019   Procedure: LEFT HEART CATH AND CORONARY ANGIOGRAPHY;  Surgeon: Sherren Mocha, MD;  Location: [facility];  Service: Cardiovascular;  Laterality: N/A;  . LEFT HEART CATH AND CORONARY ANGIOGRAPHY N/A 01/04/2020   Procedure: LEFT HEART CATH AND CORONARY ANGIOGRAPHY;  Surgeon: Sherren Mocha, MD;  Location: [facility];  Service: Cardiovascular;  Laterality: N/A;  . PARTIAL COLECTOMY N/A 09/17/2018   Procedure: PARTIAL COLECTOMY WITH PARTIAL WEDGE RESECTION LIVER METASTASIS;  Surgeon: Aviva Signs, MD;  Location: [facility];  Service: General;  Laterality: N/A;   . POLYPECTOMY  08/29/2018   Procedure: POLYPECTOMY;  Surgeon: Daneil Dolin, MD;  [facility];  Service: Endoscopy;;  . PORTACATH PLACEMENT Left 10/10/2018   Procedure: INSERTION PORT-A-CATH (catheter in left subclavian);  Surgeon: Aviva Signs, MD;  Location: [facility];  Service: General;  Laterality: Left;  . UMBILICAL HERNIA REPAIR N/A 02/07/2015   Procedure: UMBILICAL HERNIORRHAPHY WITH MESH;  Surgeon: Aviva Signs Md, MD;  Location: [facility];  Service: General;  Laterality: N/A;       Family History  Problem Relation Age of Onset  . Hypertension Father   . Diabetes Father   . Heart disease Father   . Diabetes Sister   . Hypertension Sister   . Hypertension Brother   . Diabetes Brother   . Diabetes Sister   . Diabetes Brother   . Heart disease Brother   . Lymphoma Brother   . Hypertension Son     Social History   Tobacco Use  . Smoking status: Never Smoker  . Smokeless tobacco: Never Used  Vaping Use  . Vaping Use: Never used  Substance Use Topics  . Alcohol use: No  . Drug use: No    Home Medications Prior to Admission medications   Medication Sig Start Date  End Date Taking? Authorizing Provider  apixaban (ELIQUIS) 5 MG TABS tablet Take 1 tablet (5 mg total) by mouth 2 (two) times daily. 05/31/20  Yes Sherren Mocha, MD  Coenzyme Q10 (COQ10) 100 MG CAPS Take 100 mg by mouth daily.   Yes [provider]  diltiazem (CARDIZEM CD) 360 MG 24 hr capsule Take 1 capsule (360 mg total) by mouth daily. 04/01/20 03/27/21 Yes Sherren Mocha, MD  diphenhydramine-acetaminophen (TYLENOL PM) 25-500 MG TABS tablet Take 2 tablets by mouth at bedtime.   Yes [provider]  Evolocumab (REPATHA SURECLICK) 622 MG/ML SOAJ Inject 140 mg into the skin every 14 (fourteen) days. 08/02/20  Yes Sherren Mocha, MD  ezetimibe-simvastatin (VYTORIN) 10-80 MG tablet Take 1 tablet by mouth daily.  09/03/20  Yes [provider]  FARXIGA 10 MG TABS tablet Take 10  mg by mouth daily.  12/19/18  Yes [provider]  hydrochlorothiazide (HYDRODIURIL) 25 MG tablet Take 1 tablet (25 mg total) by mouth daily. 09/24/19  Yes Bhagat, Bhavinkumar, PA  icosapent Ethyl (VASCEPA) 1 g capsule Take 2 capsules (2 g total) by mouth 2 (two) times daily. 09/27/20  Yes Weaver, Scott T, PA-C  insulin aspart protamine- aspart (NOVOLOG MIX 70/30) (70-30) 100 UNIT/ML injection Inject 20 Units into the skin in the morning, at noon, and at bedtime.    Yes [provider]  Insulin Glargine (BASAGLAR KWIKPEN) 100 UNIT/ML SOPN Inject 40 Units into the skin at bedtime.  12/10/18  Yes [provider]  isosorbide mononitrate (IMDUR) 30 MG 24 hr tablet Take 1 tablet (30 mg total) by mouth every evening. 12 hours after AM dose of Isosorbide 07/26/20 07/26/21 Yes Weaver, Scott T, PA-C  isosorbide mononitrate (IMDUR) 60 MG 24 hr tablet Take 1 tablet (60 mg total) by mouth daily. 02/09/20  Yes Weaver, Scott T, PA-C  JANUVIA 100 MG tablet Take 100 mg by mouth daily.  12/19/18  Yes [provider]  metFORMIN (GLUCOPHAGE) 500 MG tablet Take 500 mg by mouth 2 (two) times daily. 08/27/19  Yes [provider]  metoprolol succinate (TOPROL-XL) 100 MG 24 hr tablet Take 1 tablet (100 mg total) by mouth 2 (two) times daily. Take with or immediately following a meal. 09/16/19  Yes Reino Bellis B, NP  nitroGLYCERIN (NITROSTAT) 0.4 MG SL tablet PLACE 1 TAB UNDER TONGUE EVERY 5 MIN IF NEEDED FOR CHEST PAIN. MAY USE 3 TIMES.NO RELIEF CALL 911. Patient taking differently: Place 0.4 mg under the tongue every 5 (five) minutes as needed for chest pain.  08/25/20  Yes Sherren Mocha, MD  pantoprazole (PROTONIX) 40 MG tablet Take 40 mg by mouth daily.   Yes [provider]  ramipril (ALTACE) 10 MG capsule Take 10 mg by mouth daily.   Yes [provider]  rosuvastatin (CRESTOR) 40 MG tablet Take 1 tablet (40 mg total) by mouth at bedtime. 01/06/20  Yes Cheryln Manly, NP  ezetimibe (ZETIA) 10 MG tablet Take 1 tablet (10 mg total) by mouth daily. Patient not taking: Reported on 09/30/2020 09/17/19   Reino Bellis B, NP  oxyCODONE (OXY IR/ROXICODONE) 5 MG immediate release tablet Take 1 tablet (5 mg total) by mouth every 6 (six) hours as needed for moderate pain. Patient not taking: Reported on 09/30/2020 02/28/20   Dagoberto Ligas, PA-C    Allergies    Anectine [succinylcholine] and Nsaids  Review of Systems   Review of Systems  Constitutional: Negative for chills and fever.  HENT: Negative for  congestion and rhinorrhea.   Respiratory: Negative for cough and shortness of breath.   Cardiovascular: Positive for chest pain. Negative for palpitations.  Gastrointestinal: Negative for diarrhea, nausea and vomiting.  Genitourinary: Negative for difficulty urinating and dysuria.  Musculoskeletal: Negative for arthralgias and back pain.  Skin: Negative for color change and rash.  Neurological: Negative for light-headedness and headaches.    Physical Exam Updated Vital Signs BP (!) 153/72   Pulse 96   Temp (!) 97.4 F (36.3 C) (Oral)   Resp (!) 21   Ht 5\' 4"  (1.626 m)   Wt 78.5 kg   SpO2 99%   BMI 29.70 kg/m   Physical Exam Vitals and nursing note reviewed.  Constitutional:      General: He is not in acute distress.    Appearance: Normal appearance.  HENT:     Head: Normocephalic and atraumatic.     Nose: No rhinorrhea.  Eyes:     General:        Right eye: No discharge.        Left eye: No discharge.     Conjunctiva/sclera: Conjunctivae normal.  Cardiovascular:     Rate and Rhythm: Normal rate and regular rhythm.     Heart sounds: Normal heart sounds.  Pulmonary:     Effort: Pulmonary effort is normal.     Breath sounds: No stridor. No decreased breath sounds or wheezing.  Abdominal:     General: Abdomen is flat. There is no distension.     Palpations: Abdomen is soft.  Musculoskeletal:        General: No deformity or  signs of injury.  Skin:    General: Skin is warm and dry.  Neurological:     General: No focal deficit present.     Mental Status: He is alert. Mental status is at baseline.     Motor: No weakness.  Psychiatric:        Mood and Affect: Mood normal.        Behavior: Behavior normal.        Thought Content: Thought content normal.     ED Results / Procedures / Treatments   Labs (all labs ordered are listed, but only abnormal results are displayed) Labs Reviewed  BASIC METABOLIC PANEL - Abnormal; Notable for the following components:      Result Value   Sodium 133 (*)    Chloride 96 (*)    Glucose, Bld 592 (*)    All other components within normal limits  CBC - Abnormal; Notable for the following components:   WBC 11.3 (*)    Hemoglobin 12.0 (*)    HCT 37.5 (*)    MCH 25.6 (*)    All other components within normal limits  D-DIMER, QUANTITATIVE (NOT AT [facility]) - Abnormal; Notable for the following components:   D-Dimer, Quant 1.74 (*)    All other components within normal limits  TROPONIN I (HIGH SENSITIVITY) - Abnormal; Notable for the following components:   Troponin I (High Sensitivity) 268 (*)    All other components within normal limits  TROPONIN I (HIGH SENSITIVITY) - Abnormal; Notable for the following components:   Troponin I (High Sensitivity) 241 (*)    All other components within normal limits  RESPIRATORY PANEL BY RT PCR (FLU A&B, COVID)  HIV ANTIBODY (ROUTINE TESTING W REFLEX)    EKG EKG Interpretation  Date/Time:  Friday September 30 2020 10:00:54 EST Ventricular Rate:  111 PR Interval:  192 QRS Duration: 106  QT Interval:  338 QTC Calculation: 459 R Axis:   -46 Text Interpretation: Sinus tachycardia Left axis deviation Pulmonary disease pattern Minimal voltage criteria for LVH, may be normal variant ( Cornell product ) Nonspecific ST abnormality Abnormal ECG No significant change since last tracing Confirmed by Madalyn Rob [phone]) on 09/30/2020  10:13:50 AM Also confirmed by Dewaine Conger [phone])  on 09/30/2020 11:45:26 AM   Radiology DG Chest 2 View  Result Date: 09/30/2020 CLINICAL DATA:  Chest pain EXAM: CHEST - 2 VIEW COMPARISON:  01/03/2019 FINDINGS: Cardiac shadow is within normal limits. Postoperative changes are seen. Left chest wall port is again noted and stable. Aortic calcifications are seen. The lungs are well aerated bilaterally. No focal infiltrate or sizable effusion is seen. No acute bony abnormality is noted. IMPRESSION: No acute abnormality noted. Electronically Signed   By: Inez Catalina M.D.   On: 09/30/2020 10:27   CT ABDOMEN PELVIS W CONTRAST  Result Date: 09/28/2020 CLINICAL DATA:  Side abdominal pain for 2 days, history of colon cancer surgery in 2019, prior chemotherapy; past history coronary artery disease post MI, diabetes mellitus, hypertension, ischemic cardiomyopathy EXAM: CT ABDOMEN AND PELVIS WITH CONTRAST TECHNIQUE: Multidetector CT imaging of the abdomen and pelvis was performed using the standard protocol following bolus administration of intravenous contrast. Sagittal and coronal MPR images reconstructed from axial data set. CONTRAST:  162mL OMNIPAQUE IOHEXOL 300 MG/ML SOLN IV. Dilute oral contrast. COMPARISON:  03/21/2007 FINDINGS: Lower chest: Bibasilar pulmonary nodules highly suspicious for metastatic disease, new, nodules up to 6 mm diameter. Minimal bibasilar atelectasis. Hepatobiliary: Gallbladder unremarkable. No biliary dilatation. Multiple ill-defined hepatic masses consistent with metastatic disease. Largest lesions measure 3.8 x 3.7 cm anterior RIGHT lobe image 21, 3.3 x 1.9 cm inferior RIGHT lobe image 30, and 2.7 x 2.1 cm superiorly LEFT lobe image 14. Pancreas: Normal appearance Spleen: Normal appearance Adrenals/Urinary Tract: Adrenal glands, kidneys, and ureters normal appearance. Mild anterior wall bladder wall thickening, question artifact related underdistention though cystitis and less likely  tumor not completely excluded. Stomach/Bowel: Few uncomplicated distal colonic diverticula. Prior segmental resection LEFT:. Stomach and bowel loops otherwise normal appearance. Normal appendix. Vascular/Lymphatic: Atherosclerotic calcifications aorta including visceral artery origins, iliac arteries, femoral arteries. Aorta normal caliber. Scattered normal sized mesenteric and retroperitoneal lymph nodes. Reproductive: Unremarkable prostate gland and seminal vesicles Other: Tumor nodules are seen throughout the mid abdomen consistent with peritoneal metastases. Largest of these is 4.0 x 2.7 cm anterior RIGHT mid abdomen image 51. Next largest lesion is in lateral LEFT upper pelvis 2.1 x 1.8 cm. No ascites. No free air. Musculoskeletal: BILATERAL pars defects L5 without listhesis. No definite osseous metastatic lesions identified. IMPRESSION: Multiple new hepatic and pulmonary metastases. Peritoneal based soft tissue nodules in the mid abdomen consistent with metastases. Mild anterior bladder wall thickening, question artifact related underdistention though cystitis and less likely tumor not completely excluded; recommend correlation with urinalysis. Minimal distal colonic diverticulosis without evidence of diverticulitis. Aortic Atherosclerosis (ICD10-I70.0). Findings called to Dr. Gerarda Fraction on 09/28/2020 at 1421 hrs. Electronically Signed   By: Lavonia Dana M.D.   On: 09/28/2020 14:23    Procedures .Critical Care Performed by: Breck Coons, MD Authorized by: Breck Coons, MD   Critical care provider statement:    Critical care time (minutes):  45   Critical care was necessary to treat or prevent imminent or life-threatening deterioration of the following conditions: NSTEMI/unstable angina.   Critical care was time spent personally by me on the following activities:  Discussions with consultants, evaluation of patient's response to treatment, examination of patient, ordering and performing treatments and  interventions, ordering and review of laboratory studies, ordering and review of radiographic studies, pulse oximetry, re-evaluation of patient's condition, obtaining history from patient or surrogate, review of old charts and blood draw for specimens   (including critical care time)  Medications Ordered in ED Medications  heparin ADULT infusion 100 units/mL (25000 units/229mL sodium chloride 0.45%) (has no administration in time range)  diltiazem (CARDIZEM CD) 24 hr capsule 360 mg (has no administration in time range)  ezetimibe-simvastatin (VYTORIN) 10-80 MG per tablet 1 tablet (has no administration in time range)  hydrochlorothiazide (HYDRODIURIL) tablet 25 mg (has no administration in time range)  icosapent Ethyl (VASCEPA) 1 g capsule 2 g (has no administration in time range)  isosorbide mononitrate (IMDUR) 24 hr tablet 30 mg (has no administration in time range)  isosorbide mononitrate (IMDUR) 24 hr tablet 60 mg (has no administration in time range)  metoprolol succinate (TOPROL-XL) 24 hr tablet 100 mg (has no administration in time range)  nitroGLYCERIN (NITROSTAT) SL tablet 0.4 mg (has no administration in time range)  ramipril (ALTACE) capsule 10 mg (has no administration in time range)  rosuvastatin (CRESTOR) tablet 40 mg (has no administration in time range)  diphenhydramine-acetaminophen (TYLENOL PM) 25-500 MG per tablet 2 tablet (has no administration in time range)  dapagliflozin propanediol (FARXIGA) tablet 10 mg (has no administration in time range)  pantoprazole (PROTONIX) EC tablet 40 mg (has no administration in time range)  apixaban (ELIQUIS) tablet 5 mg (has no administration in time range)  acetaminophen (TYLENOL) tablet 650 mg (has no administration in time range)    Or  acetaminophen (TYLENOL) suppository 650 mg (has no administration in time range)  ondansetron (ZOFRAN) tablet 4 mg (has no administration in time range)    Or  ondansetron (ZOFRAN) injection 4 mg (has  no administration in time range)  sodium chloride 0.9 % bolus 1,000 mL (0 mLs Intravenous Stopped 09/30/20 1300)  aspirin chewable tablet 324 mg (324 mg Oral Given 09/30/20 1229)    ED Course  I have reviewed the triage vital signs and the nursing notes.  Pertinent labs & imaging results that were available during my care of the patient were reviewed by me and considered in my medical decision making (see chart for details).    MDM Rules/Calculators/A&P                          Artery disease, recent history of metastatic colon cancer, will get PE study will work-up for unstable angina.  EKG shows no acute ischemic change interval abnormality or arrhythmia.  Review of lab studies show initial troponin markedly elevated.  Aspirin given heparin started.  Likely unstable angina.  PE study still pending.  Consulted cardiology who recommends hospitalist admission to the patient's multiple medical comorbidities.  I reviewed the chest x-ray no acute cardiopulmonary pathology.  Reviewed all the labs hyperglycemia with no signs of DKA.  The patient agrees this plan will be admitted.  Medicine service agrees and will take over care pending the PE study.  Patient is heparinized.  CRITICAL CARE Performed by: Breck Coons   Total critical care time: 45 minutes  Critical care time was exclusive of separately billable procedures and treating other patients.  Critical care was necessary to treat or prevent imminent or life-threatening deterioration.  Critical care was time spent personally by me on the  following activities: development of treatment plan with patient and/or surrogate as well as nursing, discussions with consultants, evaluation of patient's response to treatment, examination of patient, obtaining history from patient or surrogate, ordering and performing treatments and interventions, ordering and review of laboratory studies, ordering and review of radiographic studies, pulse oximetry and  re-evaluation of patient's condition.   Final Clinical Impression(s) / ED Diagnoses Final diagnoses:  Unstable angina [facility])    Rx / DC Orders ED Discharge Orders    None       Breck Coons, MD 09/30/20 1321

## 2020-09-30 NOTE — ED Notes (Signed)
Date and time results received: 09/30/20 1204 (use smartphrase ".now" to insert current time)  Test: glucose and troponin Critical Value: glucose 592, troponin 268  Name of Provider Notified: Ron Parker

## 2020-10-01 ENCOUNTER — Encounter (HOSPITAL_COMMUNITY): Payer: Federal, State, Local not specified - PPO

## 2020-10-01 ENCOUNTER — Encounter (HOSPITAL_COMMUNITY): Payer: Self-pay | Admitting: Internal Medicine

## 2020-10-01 DIAGNOSIS — I200 Unstable angina: Secondary | ICD-10-CM

## 2020-10-01 DIAGNOSIS — E1169 Type 2 diabetes mellitus with other specified complication: Secondary | ICD-10-CM | POA: Diagnosis not present

## 2020-10-01 DIAGNOSIS — I25110 Atherosclerotic heart disease of native coronary artery with unstable angina pectoris: Secondary | ICD-10-CM | POA: Diagnosis not present

## 2020-10-01 DIAGNOSIS — I10 Essential (primary) hypertension: Secondary | ICD-10-CM | POA: Diagnosis not present

## 2020-10-01 LAB — BASIC METABOLIC PANEL
Anion gap: 10 (ref 5–15)
BUN: 17 mg/dL (ref 8–23)
CO2: 25 mmol/L (ref 22–32)
Calcium: 9.4 mg/dL (ref 8.9–10.3)
Chloride: 102 mmol/L (ref 98–111)
Creatinine, Ser: 0.96 mg/dL (ref 0.61–1.24)
GFR, Estimated: >60 mL/min (ref >60)
Glucose, Bld: 197 mg/dL — ABNORMAL HIGH (ref 70–99)
Potassium: 3.3 mmol/L — ABNORMAL LOW (ref 3.5–5.1)
Sodium: 137 mmol/L (ref 135–145)

## 2020-10-01 LAB — GLUCOSE, CAPILLARY
Glucose-Capillary: 266 mg/dL — ABNORMAL HIGH (ref 70–99)
Glucose-Capillary: 278 mg/dL — ABNORMAL HIGH (ref 70–99)

## 2020-10-01 LAB — CBC WITH DIFFERENTIAL/PLATELET
Abs Immature Granulocytes: 0.05 10*3/uL (ref 0.00–0.07)
Basophils Absolute: 0.1 10*3/uL (ref 0.0–0.1)
Basophils Relative: 1 %
Eosinophils Absolute: 0.1 10*3/uL (ref 0.0–0.5)
Eosinophils Relative: 1 %
HCT: 34.3 % — ABNORMAL LOW (ref 39.0–52.0)
Hemoglobin: 11.2 g/dL — ABNORMAL LOW (ref 13.0–17.0)
Immature Granulocytes: 1 %
Lymphocytes Relative: 34 %
Lymphs Abs: 3.8 10*3/uL (ref 0.7–4.0)
MCH: 25.7 pg — ABNORMAL LOW (ref 26.0–34.0)
MCHC: 32.7 g/dL (ref 30.0–36.0)
MCV: 78.9 fL — ABNORMAL LOW (ref 80.0–100.0)
Monocytes Absolute: 1.1 10*3/uL — ABNORMAL HIGH (ref 0.1–1.0)
Monocytes Relative: 10 %
Neutro Abs: 6 10*3/uL (ref 1.7–7.7)
Neutrophils Relative %: 53 %
Platelets: 276 10*3/uL (ref 150–400)
RBC: 4.35 MIL/uL (ref 4.22–5.81)
RDW: 14.1 % (ref 11.5–15.5)
WBC: 11.1 10*3/uL — ABNORMAL HIGH (ref 4.0–10.5)
nRBC: 0 % (ref 0.0–0.2)

## 2020-10-01 LAB — HEPARIN LEVEL (UNFRACTIONATED): Heparin Unfractionated: 1.58 IU/mL — ABNORMAL HIGH (ref 0.30–0.70)

## 2020-10-01 LAB — T4, FREE: Free T4: 1.93 ng/dL — ABNORMAL HIGH (ref 0.61–1.12)

## 2020-10-01 LAB — APTT: aPTT: 50 seconds — ABNORMAL HIGH (ref 24–36)

## 2020-10-01 MED ORDER — ASPIRIN 81 MG PO TBEC: 81.0000 mg | DELAYED_RELEASE_TABLET | Freq: Every day | ORAL | 0 refills | Status: DC

## 2020-10-01 MED ORDER — ISOSORBIDE MONONITRATE ER 30 MG PO TB24: 30.0000 mg | ORAL_TABLET | Freq: Every evening | ORAL | 0 refills | Status: DC

## 2020-10-01 MED ORDER — CHLORHEXIDINE GLUCONATE CLOTH 2 % EX PADS
6.0000 | MEDICATED_PAD | Freq: Every day | CUTANEOUS | Status: DC
  Administered 2020-10-01: 6 via TOPICAL

## 2020-10-01 MED ORDER — HEPARIN SOD (PORK) LOCK FLUSH 100 UNIT/ML IV SOLN
500.0000 [IU] | INTRAVENOUS | Status: AC | PRN
  Administered 2020-10-01: 500 [IU]
  Filled 2020-10-01: qty 5

## 2020-10-01 MED ORDER — NITROGLYCERIN 0.4 MG SL SUBL: 0.4000 mg | SUBLINGUAL_TABLET | SUBLINGUAL | 0 refills | Status: DC | PRN

## 2020-10-01 MED ORDER — RANOLAZINE ER 500 MG PO TB12: 500.0000 mg | ORAL_TABLET | Freq: Two times a day (BID) | ORAL | 0 refills | Status: DC

## 2020-10-01 MED ORDER — SODIUM CHLORIDE 0.9% FLUSH: 10.0000 mL | Freq: Two times a day (BID) | INTRAVENOUS | Status: DC

## 2020-10-01 NOTE — Progress Notes (Signed)
ANTICOAGULATION CONSULT NOTE - Follow Up Consult  Pharmacy Consult for heparin Indication: atrial fibrillation and demand ischemia vs NSTEMI  Labs: Recent Labs    09/28/20 1300 09/30/20 1007 09/30/20 1155 10/01/20 0442  HGB  --  12.0*  --  11.2*  HCT  --  37.5*  --  34.3*  PLT  --  281  --  276  APTT  --   --   --  50*  HEPARINUNFRC  --   --   --  1.58*  CREATININE 1.00 1.17  --  0.96  TROPONINIHS  --  268* 241*  --     Assessment: 64yo male subtherapeutic on heparin with initial dosing while Eliquis on hold; no gtt issues or signs of bleeding per RN.  Goal of Therapy:  aPTT 66-102 seconds   Plan:  Will increase heparin gtt by 4 units/kg/hr to 1200 units/hr and check level in 6 hours.    Wynona Neat, PharmD, BCPS  10/01/2020,6:09 AM

## 2020-10-01 NOTE — Progress Notes (Signed)
Pt discharged via wheelchair with NT 

## 2020-10-01 NOTE — Discharge Summary (Signed)
Physician Discharge Summary  Ian [name] GHW:[id number] DOB: [birth year] DOA: 09/30/2020  PCP: Sharilyn Sites, MD  Admit date: 09/30/2020 Discharge date: 10/01/2020  Admitted From: Home Disposition: Home  Recommendations for Outpatient Follow-up:  1. Follow up with PCP in 1-2 weeks 2. Follow-up with cardiology in 1-2 weeks 3. Follow-up with medical oncology in 1-2 weeks 4. Follow-up free T3 and free T4 for low TSH as pain at time of discharge 5. Started on ranolazine 500 mg p.o. twice daily, added Imdur 30 mg p.o. nightly by cardiology  Home Health: No Equipment/Devices: None  Discharge Condition: Stable CODE STATUS: Full code Diet recommendation: Heart healthy diet  History of present illness:  Ian [name] is a 64 year old male with past medical history significant for CAD s/p CABG 2008, STEMI 2020 s/p DES, STEMI 12/2019 s/p DES, type 2 diabetes mellitus, GERD, essential hypertension, hyperlipidemia, ischemic cardiomyopathy, PAD s/p right femoropopliteal bypass graft 2021, paroxysmal atrial fibrillation, metastatic colon cancer to liver who presented to the ED with continued chest discomfort.  Patient reports pain sharp in nature without radiation worse with exertion and improved with sublingual nitroglycerin.  Patient continued to have similar pain episodes with relief by nitroglycerin.  And given recurrence and similar pain symptoms to previous MI, patient presented to the ED for further evaluation.  Upon arrival to the ED, patient remained chest pain-free.  He was hemodynamically stable with normal vital signs.  1.0, chloride 96, glucose 592, BUN 23, creatinine 1.17.  WBC 11.3, hemoglobin 12.0, platelets 281.  Covid-19/rapid influenza negative.  Troponin [id number].  Tray with no acute cardiopulmonary abnormality.   Hospital course:  Unstable angina Essential hypertension CAD s/p CABG/PCI Ischemic cardiomyopathy Patient presenting to the ED with recurrent chest pain  localized anterior chest without radiation, sharp in nature with worsening with exertion and relief with nitroglycerin.  Extensive cardiac history. Troponin elevated on admission 268>241.  EKG with sinus tachycardia, ST depression V4-V6; which is unchanged from previous EKG dated 07/2020.  Patient was started on heparin drip and cardiology was consulted and followed during hospital course.  TTE with LVEF 78-93%, grade 2 diastolic dysfunction with IVC dilated.  Started on ranolazine 500 mg p.o. twice daily.  Increased isosorbide to 60 mg p.o. every morning and 30 mg p.o. nightly.  Continue metoprolol succinate 100 mg p.o. twice daily, ramipril 10 mg p.o. daily, HCTZ 25 mg p.o. daily, diltiazem 360 mg p.o. daily.  Continue aspirin 81 mg p.o. daily, Crestor 40 mg p.o. daily, Vascepa 2 g p.o. twice daily, Vytorin daily Repatha 140 mg every 14 days.  Outpatient follow-up with cardiology, Dr. Burt Knack.  Type 2 diabetes mellitus, poorly controlled Glucose elevated 592 on admission.  Hemoglobin A1c 13.4, poorly controlled.  Patient reports poor compliance with home regimen.  Patient was restarted on his home regimen with better control of his glucose.  Continue Farxiga 10 mg p.o. daily, Lantus 40 units subcutaneously daily, NovoLog 20 units subcutaneously TIDAC, Januvia 100 mg p.o. daily, Metformin 500 mg p.o. twice daily.  Will need close follow-up with PCP for further guidance and adjustments of home regimen to achieve improved A1c.  Metastatic colon cancer to liver Patient with recent CT abdomen/pelvis 09/28/2020 concerning for interval development of new hepatic, pulmonary and peritoneal soft tissue nodules. Follow-up scheduled with oncology, Dr. Delton Coombes on 10/11/2020  Low TSH TSH <0.01.  Free T3/free T4 pending at time of discharge.  Follow-up with PCP.  Paroxysmal atrial fibrillation Continue diltiazem 360 mg p.o. daily, metoprolol succinate 100 mg p.o.  twice daily and anticoagulation with  Eliquis.     Discharge Diagnoses:  Principal Problem:   Unstable angina (HCC) Active Problems:   Obesity   Essential hypertension   Coronary artery disease involving native coronary artery of native heart with unstable angina pectoris (HCC)   Hyperlipidemia associated with type 2 diabetes mellitus ([place])   Diabetes mellitus type II, uncontrolled ([place])   Liver mass, right lobe   PAD (peripheral artery disease) ([place])   Mixed hyperlipidemia    Discharge Instructions  Discharge Instructions    Call MD for:  difficulty breathing, headache or visual disturbances   Complete by: As directed    Call MD for:  extreme fatigue   Complete by: As directed    Call MD for:  persistant dizziness or light-headedness   Complete by: As directed    Call MD for:  persistant nausea and vomiting   Complete by: As directed    Call MD for:  severe uncontrolled pain   Complete by: As directed    Call MD for:  temperature >100.4   Complete by: As directed    Diet - low sodium heart healthy   Complete by: As directed    Increase activity slowly   Complete by: As directed      Allergies as of 10/01/2020      Reactions   Anectine [succinylcholine] Other (See Comments)   Atypical pseudocholinesterase deficiency.    Nsaids Other (See Comments)   Contraindicated (can only have asa 81mg )      Medication List    STOP taking these medications   ezetimibe 10 MG tablet Commonly known as: ZETIA   oxyCODONE 5 MG immediate release tablet Commonly known as: Oxy IR/ROXICODONE     TAKE these medications   apixaban 5 MG Tabs tablet Commonly known as: ELIQUIS Take 1 tablet (5 mg total) by mouth 2 (two) times daily.   aspirin 81 MG EC tablet Take 1 tablet (81 mg total) by mouth daily. Swallow whole. Start taking on: October 02, 2020   Basaglar KwikPen 100 UNIT/ML Inject 40 Units into the skin at bedtime.   CoQ10 100 MG Caps Take 100 mg by mouth daily.   diltiazem 360 MG 24 hr  capsule Commonly known as: CARDIZEM CD Take 1 capsule (360 mg total) by mouth daily.   diphenhydramine-acetaminophen 25-500 MG Tabs tablet Commonly known as: TYLENOL PM Take 2 tablets by mouth at bedtime.   ezetimibe-simvastatin 10-80 MG tablet Commonly known as: VYTORIN Take 1 tablet by mouth daily.   Farxiga 10 MG Tabs tablet Generic drug: dapagliflozin propanediol Take 10 mg by mouth daily.   hydrochlorothiazide 25 MG tablet Commonly known as: HYDRODIURIL Take 1 tablet (25 mg total) by mouth daily.   icosapent Ethyl 1 g capsule Commonly known as: VASCEPA Take 2 capsules (2 g total) by mouth 2 (two) times daily.   insulin aspart protamine- aspart (70-30) 100 UNIT/ML injection Commonly known as: NOVOLOG MIX 70/30 Inject 20 Units into the skin in the morning, at noon, and at bedtime.   isosorbide mononitrate 60 MG 24 hr tablet Commonly known as: IMDUR Take 1 tablet (60 mg total) by mouth daily. What changed: Another medication with the same name was changed. Make sure you understand how and when to take each.   isosorbide mononitrate 30 MG 24 hr tablet Commonly known as: IMDUR Take 1 tablet (30 mg total) by mouth every evening. What changed: additional instructions   Januvia 100 MG tablet Generic drug: sitaGLIPtin  Take 100 mg by mouth daily.   metFORMIN 500 MG tablet Commonly known as: GLUCOPHAGE Take 500 mg by mouth 2 (two) times daily.   metoprolol succinate 100 MG 24 hr tablet Commonly known as: TOPROL-XL Take 1 tablet (100 mg total) by mouth 2 (two) times daily. Take with or immediately following a meal.   nitroGLYCERIN 0.4 MG SL tablet Commonly known as: NITROSTAT Place 1 tablet (0.4 mg total) under the tongue every 5 (five) minutes as needed for chest pain. What changed: See the new instructions.   pantoprazole 40 MG tablet Commonly known as: PROTONIX Take 40 mg by mouth daily.   ramipril 10 MG capsule Commonly known as: ALTACE Take 10 mg by mouth  daily.   ranolazine 500 MG 12 hr tablet Commonly known as: RANEXA Take 1 tablet (500 mg total) by mouth 2 (two) times daily.   Repatha SureClick 532 MG/ML [place] Generic drug: Evolocumab Inject 140 mg into the skin every 14 (fourteen) days.   rosuvastatin 40 MG tablet Commonly known as: Crestor Take 1 tablet (40 mg total) by mouth at bedtime.       Follow-up Information    Sharilyn Sites, MD. Schedule an appointment as soon as possible for a visit in 1 week(s).   Specialty: Family Medicine Contact information: [address] [place] [address] [phone]        Sherren Mocha, MD. Schedule an appointment as soon as possible for a visit in 1 week(s).   Specialty: Cardiology Contact information: [address] [place] [address] [phone]        Derek Jack, MD. Schedule an appointment as soon as possible for a visit in 1 week(s).   Specialty: Hematology Contact information: [place] [address] [phone]              Allergies  Allergen Reactions  . Anectine [Succinylcholine] Other (See Comments)    Atypical pseudocholinesterase deficiency.   . Nsaids Other (See Comments)    Contraindicated (can only have asa 81mg )    Consultations:  Cardiology   Procedures/Studies: DG Chest 2 View  Result Date: 09/30/2020 CLINICAL DATA:  Chest pain EXAM: CHEST - 2 VIEW COMPARISON:  01/03/2019 FINDINGS: Cardiac shadow is within normal limits. Postoperative changes are seen. Left chest wall port is again noted and stable. Aortic calcifications are seen. The lungs are well aerated bilaterally. No focal infiltrate or sizable effusion is seen. No acute bony abnormality is noted. IMPRESSION: No acute abnormality noted. Electronically Signed   By: Inez Catalina M.D.   On: 09/30/2020 10:27   CT ABDOMEN PELVIS W CONTRAST  Result Date: 09/28/2020 CLINICAL DATA:  Side abdominal pain for 2 days, history of colon cancer  surgery in 2019, prior chemotherapy; past history coronary artery disease post MI, diabetes mellitus, hypertension, ischemic cardiomyopathy EXAM: CT ABDOMEN AND PELVIS WITH CONTRAST TECHNIQUE: Multidetector CT imaging of the abdomen and pelvis was performed using the standard protocol following bolus administration of intravenous contrast. Sagittal and coronal MPR images reconstructed from axial data set. CONTRAST:  188mL OMNIPAQUE IOHEXOL 300 MG/ML SOLN IV. Dilute oral contrast. COMPARISON:  03/21/2007 FINDINGS: Lower chest: Bibasilar pulmonary nodules highly suspicious for metastatic disease, new, nodules up to 6 mm diameter. Minimal bibasilar atelectasis. Hepatobiliary: Gallbladder unremarkable. No biliary dilatation. Multiple ill-defined hepatic masses consistent with metastatic disease. Largest lesions measure 3.8 x 3.7 cm anterior RIGHT lobe image 21, 3.3 x 1.9 cm inferior RIGHT lobe image 30, and 2.7 x 2.1  cm superiorly LEFT lobe image 14. Pancreas: Normal appearance Spleen: Normal appearance Adrenals/Urinary Tract: Adrenal glands, kidneys, and ureters normal appearance. Mild anterior wall bladder wall thickening, question artifact related underdistention though cystitis and less likely tumor not completely excluded. Stomach/Bowel: Few uncomplicated distal colonic diverticula. Prior segmental resection LEFT:. Stomach and bowel loops otherwise normal appearance. Normal appendix. Vascular/Lymphatic: Atherosclerotic calcifications aorta including visceral artery origins, iliac arteries, femoral arteries. Aorta normal caliber. Scattered normal sized mesenteric and retroperitoneal lymph nodes. Reproductive: Unremarkable prostate gland and seminal vesicles Other: Tumor nodules are seen throughout the mid abdomen consistent with peritoneal metastases. Largest of these is 4.0 x 2.7 cm anterior RIGHT mid abdomen image 51. Next largest lesion is in lateral LEFT upper pelvis 2.1 x 1.8 cm. No ascites. No free air.  Musculoskeletal: BILATERAL pars defects L5 without listhesis. No definite osseous metastatic lesions identified. IMPRESSION: Multiple new hepatic and pulmonary metastases. Peritoneal based soft tissue nodules in the mid abdomen consistent with metastases. Mild anterior bladder wall thickening, question artifact related underdistention though cystitis and less likely tumor not completely excluded; recommend correlation with urinalysis. Minimal distal colonic diverticulosis without evidence of diverticulitis. Aortic Atherosclerosis (ICD10-I70.0). Findings called to Dr. Gerarda Fraction on 09/28/2020 at 1421 hrs. Electronically Signed   By: Lavonia Dana M.D.   On: 09/28/2020 14:23   ECHOCARDIOGRAM COMPLETE  Result Date: 09/30/2020    ECHOCARDIOGRAM REPORT   Patient Name:   Ian [name] Date of Exam: 09/30/2020 Medical Rec #:  [id number]        Height:       64.0 in Accession #:    [id number]       Weight:       173.0 lb Date of Birth:  03-13-1956        BSA:          1.839 m Patient Age:    60 years         BP:           120/56 mmHg Patient Gender: M                HR:           81 bpm. Exam Location:  Inpatient Procedure: 2D Echo, Cardiac Doppler, Color Doppler and Intracardiac            Opacification Agent Indications:    Elevated Troponin  History:        Patient has prior history of Echocardiogram examinations, most                 recent 01/04/2020. Cardiomyopathy, CAD, Arrythmias:Atrial                 Fibrillation; Risk Factors:Hypertension, Dyslipidemia and                 Diabetes.  Sonographer:    Bernadene Person RDCS Referring Phys: [id number] [place]  1. Left ventricular ejection fraction, by estimation, is 40 to 45%. The left ventricle has mildly decreased function. The left ventricle demonstrates global hypokinesis. Left ventricular diastolic parameters are consistent with Grade II diastolic dysfunction (pseudonormalization).  2. Right ventricular systolic function is normal. The right ventricular  size is normal. There is normal pulmonary artery systolic pressure. The estimated right ventricular systolic pressure is 25.6 mmHg.  3. Left atrial size was moderately dilated.  4. The mitral valve is normal in structure. Mild mitral valve regurgitation. No evidence of mitral stenosis.  5. The aortic valve is tricuspid. Aortic  valve regurgitation is not visualized. Mild aortic valve stenosis. Aortic valve mean gradient measures 11.0 mmHg.  6. The inferior vena cava is dilated in size with >50% respiratory variability, suggesting right atrial pressure of 8 mmHg.  7. Technically difficult study with very poor images even with Definity. For more accurate EF determination, would consider cardiac MR. FINDINGS  Left Ventricle: Left ventricular ejection fraction, by estimation, is 40 to 45%. The left ventricle has mildly decreased function. The left ventricle demonstrates global hypokinesis. Definity contrast agent was given IV to delineate the left ventricular  endocardial borders. The left ventricular internal cavity size was normal in size. There is no left ventricular hypertrophy. Left ventricular diastolic parameters are consistent with Grade II diastolic dysfunction (pseudonormalization). Right Ventricle: The right ventricular size is normal. No increase in right ventricular wall thickness. Right ventricular systolic function is normal. There is normal pulmonary artery systolic pressure. The tricuspid regurgitant velocity is 2.59 m/s, and  with an assumed right atrial pressure of 8 mmHg, the estimated right ventricular systolic pressure is 41.2 mmHg. Left Atrium: Left atrial size was moderately dilated. Right Atrium: Right atrial size was normal in size. Pericardium: There is no evidence of pericardial effusion. Mitral Valve: The mitral valve is normal in structure. There is mild calcification of the mitral valve leaflet(s). Mild mitral annular calcification. Mild mitral valve regurgitation. No evidence of mitral  valve stenosis. Tricuspid Valve: The tricuspid valve is normal in structure. Tricuspid valve regurgitation is trivial. Aortic Valve: The aortic valve is tricuspid. Aortic valve regurgitation is not visualized. Mild aortic stenosis is present. Aortic valve mean gradient measures 11.0 mmHg. Aortic valve peak gradient measures 18.1 mmHg. Aortic valve area, by VTI measures 2.46 cm. Pulmonic Valve: The pulmonic valve was normal in structure. Pulmonic valve regurgitation is not visualized. Aorta: The aortic root is normal in size and structure. Venous: The inferior vena cava is dilated in size with greater than 50% respiratory variability, suggesting right atrial pressure of 8 mmHg. IAS/Shunts: No atrial level shunt detected by color flow Doppler.  LEFT VENTRICLE PLAX 2D LVIDd:         4.80 cm     Diastology LVIDs:         3.40 cm     LV e' medial:    5.78 cm/s LV PW:         1.10 cm     LV E/e' medial:  21.8 LV IVS:        1.10 cm     LV e' lateral:   7.53 cm/s LVOT diam:     2.20 cm     LV E/e' lateral: 16.7 LV SV:         111 LV SV Index:   60 LVOT Area:     3.80 cm  LV Volumes (MOD) LV vol d, MOD A2C: 80.2 ml LV vol d, MOD A4C: 93.2 ml LV vol s, MOD A2C: 40.0 ml LV vol s, MOD A4C: 40.2 ml LV SV MOD A2C:     40.2 ml LV SV MOD A4C:     93.2 ml LV SV MOD BP:      46.0 ml RIGHT VENTRICLE RV S prime:     9.39 cm/s RVOT diam:      2.60 cm TAPSE (M-mode): 1.5 cm LEFT ATRIUM             Index       RIGHT ATRIUM           Index  LA diam:        4.50 cm 2.45 cm/m  RA Area:     16.90 cm LA Vol (A2C):   81.4 ml 44.25 ml/m RA Volume:   44.40 ml  24.14 ml/m LA Vol (A4C):   72.5 ml 39.41 ml/m LA Biplane Vol: 77.9 ml 42.35 ml/m  AORTIC VALVE                    PULMONIC VALVE AV Area (Vmax):    2.32 cm     RVOT Peak grad: 2 mmHg AV Area (Vmean):   2.40 cm AV Area (VTI):     2.46 cm AV Vmax:           212.50 cm/s AV Vmean:          154.000 cm/s AV VTI:            0.450 m AV Peak Grad:      18.1 mmHg AV Mean Grad:      11.0  mmHg LVOT Vmax:         129.67 cm/s LVOT Vmean:        97.400 cm/s LVOT VTI:          0.292 m LVOT/AV VTI ratio: 0.65  AORTA Ao Root diam: 3.40 cm Ao Asc diam:  2.90 cm MITRAL VALVE                TRICUSPID VALVE MV Area (PHT): 4.80 cm     TR Peak grad:   26.8 mmHg MV Decel Time: 158 msec     TR Vmax:        259.00 cm/s MV E velocity: 126.00 cm/s MV A velocity: 117.00 cm/s  SHUNTS MV E/A ratio:  1.08         Systemic VTI:  0.29 m                             Systemic Diam: 2.20 cm                             Pulmonic VTI:  0.188 m                             Pulmonic Diam: 2.60 cm                             Qp/Qs:         0.90 Loralie Champagne MD Electronically signed by Loralie Champagne MD Signature Date/Time: 09/30/2020/3:59:48 PM    Final       Subjective: Patient seen and examined bedside, resting comfortably.  Spouse present.  Chest pain has completely resolved since initial presentation to ED.  Cardiology started on ranolazine and increased Imdur dose.  Okay to discharge home.  Patient with no other complaints or concerns at this time.  Denies headache, no dizziness, no chest pain, no palpitations, no shortness of breath, no abdominal pain, no weakness, no fatigue, no paresthesias.  No acute events overnight per nursing staff.  Discharge Exam: Vitals:   10/01/20 0443 10/01/20 0836  BP: (!) 162/73 131/60  Pulse: 86 86  Resp: 20   Temp: 97.9 F (36.6 C)   SpO2: 99%    Vitals:   09/30/20 1825 09/30/20 2219 10/01/20 0443 10/01/20 0836  BP: Marland Kitchen)  119/49 137/64 (!) 162/73 131/60  Pulse:  80 86 86  Resp: 18 19 20    Temp: 98.4 F (36.9 C) 98.5 F (36.9 C) 97.9 F (36.6 C)   TempSrc: Oral Oral Oral   SpO2: 99% 97% 99%   Weight:      Height:        General: Pt is alert, awake, not in acute distress Cardiovascular: RRR, S1/S2 +, no rubs, no gallops Respiratory: CTA bilaterally, no wheezing, no rhonchi Abdominal: Soft, NT, ND, bowel sounds + Extremities: no edema, no cyanosis    The  results of significant diagnostics from this hospitalization (including imaging, microbiology, ancillary and laboratory) are listed below for reference.     Microbiology: Recent Results (from the past 240 hour(s))  Respiratory Panel by RT PCR (Flu A&B, Covid) - Nasopharyngeal Swab     Status: None   Collection Time: 09/30/20  1:08 PM   Specimen: Nasopharyngeal Swab  Result Value Ref Range Status   SARS Coronavirus 2 by RT PCR NEGATIVE NEGATIVE Final    Comment: (NOTE) SARS-CoV-2 target nucleic acids are NOT DETECTED.  The SARS-CoV-2 RNA is generally detectable in upper respiratoy specimens during the acute phase of infection. The lowest concentration of SARS-CoV-2 viral copies this assay can detect is 131 copies/mL. A negative result does not preclude SARS-Cov-2 infection and should not be used as the sole basis for treatment or other patient management decisions. A negative result may occur with  improper specimen collection/handling, submission of specimen other than nasopharyngeal swab, presence of viral mutation(s) within the areas targeted by this assay, and inadequate number of viral copies (<131 copies/mL). A negative result must be combined with clinical observations, patient history, and epidemiological information. The expected result is Negative.  Fact Sheet for Patients:  PinkCheek.be  Fact Sheet for Healthcare Providers:  GravelBags.it  This test is no t yet approved or cleared by the [place] FDA and  has been authorized for detection and/or diagnosis of SARS-CoV-2 by FDA under an Emergency Use Authorization (EUA). This EUA will remain  in effect (meaning this test can be used) for the duration of the COVID-19 declaration under Section 564(b)(1) of the Act, 21 U.S.C. section 360bbb-3(b)(1), unless the authorization is terminated or revoked sooner.     Influenza A by PCR NEGATIVE NEGATIVE Final    Influenza B by PCR NEGATIVE NEGATIVE Final    Comment: (NOTE) The Xpert Xpress SARS-CoV-2/FLU/RSV assay is intended as an aid in  the diagnosis of influenza from Nasopharyngeal swab specimens and  should not be used as a sole basis for treatment. Nasal washings and  aspirates are unacceptable for Xpert Xpress SARS-CoV-2/FLU/RSV  testing.  Fact Sheet for Patients: PinkCheek.be  Fact Sheet for Healthcare Providers: GravelBags.it  This test is not yet approved or cleared by the [place] FDA and  has been authorized for detection and/or diagnosis of SARS-CoV-2 by  FDA under an Emergency Use Authorization (EUA). This EUA will remain  in effect (meaning this test can be used) for the duration of the  Covid-19 declaration under Section 564(b)(1) of the Act, 21  U.S.C. section 360bbb-3(b)(1), unless the authorization is  terminated or revoked. Performed at [facility], [place] [address], [place], [place] [address]      Labs: BNP (last 3 results) No results for input(s): BNP in the last 8760 hours. Basic Metabolic Panel: Recent Labs  Lab 09/28/20 1300 09/30/20 1007 10/01/20 0442  NA  --  133* 137  K  --  4.0 3.3*  CL  --  96* 102  CO2  --  24 25  GLUCOSE  --  592* 197*  BUN  --  23 17  CREATININE 1.00 1.17 0.96  CALCIUM  --  9.3 9.4   Liver Function Tests: No results for input(s): AST, ALT, ALKPHOS, BILITOT, PROT, ALBUMIN in the last 168 hours. No results for input(s): LIPASE, AMYLASE in the last 168 hours. No results for input(s): AMMONIA in the last 168 hours. CBC: Recent Labs  Lab 09/30/20 1007 10/01/20 0442  WBC 11.3* 11.1*  NEUTROABS  --  6.0  HGB 12.0* 11.2*  HCT 37.5* 34.3*  MCV 80.1 78.9*  PLT 281 276   Cardiac Enzymes: No results for input(s): CKTOTAL, CKMB, CKMBINDEX, TROPONINI in the last 168 hours. BNP: Invalid input(s): POCBNP CBG: Recent Labs  Lab 09/30/20 1720 09/30/20 2217  10/01/20 0619 10/01/20 1058  GLUCAP 371* 276* 266* 278*   D-Dimer Recent Labs    09/30/20 1155  DDIMER 1.74*   Hgb A1c Recent Labs    09/30/20 1654  HGBA1C 13.4*   Lipid Profile No results for input(s): CHOL, HDL, LDLCALC, TRIG, CHOLHDL, LDLDIRECT in the last 72 hours. Thyroid function studies Recent Labs    09/30/20 1654  TSH <0.010*   Anemia work up No results for input(s): VITAMINB12, FOLATE, FERRITIN, TIBC, IRON, RETICCTPCT in the last 72 hours. Urinalysis    Component Value Date/Time   COLORURINE STRAW (A) 02/22/2020 0634   APPEARANCEUR CLEAR 02/22/2020 0634   LABSPEC 1.020 02/22/2020 0634   PHURINE 5.0 02/22/2020 0634   GLUCOSEU >=500 (A) 02/22/2020 0634   HGBUR NEGATIVE 02/22/2020 0634   [place] 02/22/2020 [place] 02/22/2020 0634   PROTEINUR 30 (A) 02/22/2020 0634   NITRITE NEGATIVE 02/22/2020 0634   LEUKOCYTESUR NEGATIVE 02/22/2020 0634   Sepsis Labs Invalid input(s): PROCALCITONIN,  WBC,  LACTICIDVEN Microbiology Recent Results (from the past 240 hour(s))  Respiratory Panel by RT PCR (Flu A&B, Covid) - Nasopharyngeal Swab     Status: None   Collection Time: 09/30/20  1:08 PM   Specimen: Nasopharyngeal Swab  Result Value Ref Range Status   SARS Coronavirus 2 by RT PCR NEGATIVE NEGATIVE Final    Comment: (NOTE) SARS-CoV-2 target nucleic acids are NOT DETECTED.  The SARS-CoV-2 RNA is generally detectable in upper respiratoy specimens during the acute phase of infection. The lowest concentration of SARS-CoV-2 viral copies this assay can detect is 131 copies/mL. A negative result does not preclude SARS-Cov-2 infection and should not be used as the sole basis for treatment or other patient management decisions. A negative result may occur with  improper specimen collection/handling, submission of specimen other than nasopharyngeal swab, presence of viral mutation(s) within the areas targeted by this assay, and inadequate  number of viral copies (<131 copies/mL). A negative result must be combined with clinical observations, patient history, and epidemiological information. The expected result is Negative.  Fact Sheet for Patients:  PinkCheek.be  Fact Sheet for Healthcare Providers:  GravelBags.it  This test is no t yet approved or cleared by the [place] FDA and  has been authorized for detection and/or diagnosis of SARS-CoV-2 by FDA under an Emergency Use Authorization (EUA). This EUA will remain  in effect (meaning this test can be used) for the duration of the COVID-19 declaration under Section 564(b)(1) of the Act, 21 U.S.C. section 360bbb-3(b)(1), unless the authorization is terminated or revoked sooner.     Influenza A by PCR NEGATIVE  NEGATIVE Final   Influenza B by PCR NEGATIVE NEGATIVE Final    Comment: (NOTE) The Xpert Xpress SARS-CoV-2/FLU/RSV assay is intended as an aid in  the diagnosis of influenza from Nasopharyngeal swab specimens and  should not be used as a sole basis for treatment. Nasal washings and  aspirates are unacceptable for Xpert Xpress SARS-CoV-2/FLU/RSV  testing.  Fact Sheet for Patients: PinkCheek.be  Fact Sheet for Healthcare Providers: GravelBags.it  This test is not yet approved or cleared by the [place] FDA and  has been authorized for detection and/or diagnosis of SARS-CoV-2 by  FDA under an Emergency Use Authorization (EUA). This EUA will remain  in effect (meaning this test can be used) for the duration of the  Covid-19 declaration under Section 564(b)(1) of the Act, 21  U.S.C. section 360bbb-3(b)(1), unless the authorization is  terminated or revoked. Performed at [facility], [place] [address] [place], [place] [address]      Time coordinating discharge: Over 30 minutes  SIGNED:   Maysin Carstens J [place] DO  [facility] 10/01/2020, 11:49 AM

## 2020-10-01 NOTE — Discharge Instructions (Signed)
Angina  Angina is very bad discomfort or pain in the chest, neck, arm, jaw, or back. The discomfort is caused by a lack of blood in the middle layer of the heart wall (myocardium). What are the causes? This condition is caused by a buildup of fat and cholesterol (plaque) in your arteries (atherosclerosis). This buildup narrows the arteries and makes it hard for blood to flow. What increases the risk? You are more likely to develop this condition if:  You have high levels of cholesterol in your blood.  You have high blood pressure (hypertension).  You have diabetes.  You have a family history of heart disease.  You are not active, or you do not exercise enough.  You feel sad (depressed).  You have been treated with high energy rays (radiation) on the left side of your chest. Other risk factors are:  Using tobacco.  Being very overweight (obese).  Eating a diet high in unhealthy fats (saturated fats).  Having stress, or being exposed to things that cause stress.  Using drugs, such as cocaine. Women have a greater risk for angina if:  They are older than 55.  They have stopped having their period (are in postmenopause). What are the signs or symptoms? Common symptoms of this condition in both men and women may include:  Chest pain, which may: ? Feel like a crushing or squeezing in the chest. ? Feel like a tightness, pressure, fullness, or heaviness in the chest. ? Last for more than a few minutes at a time. ? Stop and come back (recur) after a few minutes.  Pain in the neck, arm, jaw, or back.  Heartburn or upset stomach (indigestion) for no reason.  Being short of breath.  Feeling sick to your stomach (nauseous).  Sudden cold sweats. Women and people with diabetes may have other symptoms that are not usual, such as feeling:  Tired (fatigue).  Worried or nervous (anxious) for no reason.  Weak for no reason.  Dizzy or passing out (fainting). How is this  treated? This condition may be treated with:  Medicines. These are given to: ? Prevent blood clots. ? Prevent heart attack. ? Relax blood vessels and improve blood flow to the heart (nitrates). ? Reduce blood pressure. ? Improve the pumping action of the heart. ? Reduce fat and cholesterol in the blood.  A procedure to widen a narrowed or blocked artery in the heart (angioplasty).  Surgery to allow blood to go around a blocked artery (coronary artery bypass surgery). Follow these instructions at home: Medicines  Take over-the-counter and prescription medicines only as told by your doctor.  Do not take these medicines unless your doctor says that you can: ? NSAIDs. These include:  Ibuprofen.  Naproxen. ? Vitamin supplements that have vitamin A, vitamin E, or both. ? Hormone therapy that contains estrogen with or without progestin. Eating and drinking   Eat a heart-healthy diet that includes: ? Lots of fresh fruits and vegetables. ? Whole grains. ? Low-fat (lean) protein. ? Low-fat dairy products.  Follow instructions from your doctor about what you cannot eat or drink. Activity  Follow an exercise program that your doctor tells you.  Talk with your doctor about joining a program to help improve the health of your heart (cardiac rehab).  When you feel tired, take a break. Plan breaks if you know you are going to feel tired. Lifestyle   Do not use any products that contain nicotine or tobacco. This includes cigarettes, e-cigarettes, and   chewing tobacco. If you need help quitting, ask your doctor.  If your doctor says you can drink alcohol: ? Limit how much you use to:  0-1 drink a day for women who are not pregnant.  0-2 drinks a day for men. ? Be aware of how much alcohol is in your drink. In the [place] one drink equals:  One 12 oz bottle of beer (355 mL).  One 5 oz glass of wine (148 mL).  One 1 oz glass of hard liquor (44 mL). General instructions  Stay  at a healthy weight. If your doctor tells you to do so, work with him or her to lose weight.  Learn to deal with stress. If you need help, ask your doctor.  Keep your vaccines up to date. Get a flu shot every year.  Talk with your doctor if you feel sad. Take a screening test to see if you are at risk for depression.  Work with your doctor to manage any other health problems that you have. These may include diabetes or high blood pressure.  Keep all follow-up visits as told by your doctor. This is important. Get help right away if:  You have pain in your chest, neck, arm, jaw, or back, and the pain: ? Lasts more than a few minutes. ? Comes back. ? Does not get better after you take medicine under your tongue (sublingual nitroglycerin). ? Keeps getting worse. ? Comes more often.  You have any of these problems for no reason: ? Sweating a lot. ? Heartburn or upset stomach. ? Shortness of breath. ? Trouble breathing. ? Feeling sick to your stomach. ? Throwing up (vomiting). ? Feeling more tired than normal. ? Feeling nervous or worrying more than normal. ? Weakness.  You are suddenly dizzy or light-headed.  You pass out. These symptoms may be an emergency. Do not wait to see if the symptoms will go away. Get medical help right away. Call your local emergency services (911 in the [place] Do not drive yourself to the hospital. Summary  Angina is very bad discomfort or pain in the chest, neck, arm, neck, or back.  Symptoms include chest pain, heartburn or upset stomach for no reason, and shortness of breath.  Women or people with diabetes may have symptoms that are not usual, such as feeling nervous or worried for no reason, weak for no reason, or tired.  Take all medicines only as told by your doctor.  You should eat a heart-healthy diet and follow an exercise program. This information is not intended to replace advice given to you by your health care provider. Make sure you  discuss any questions you have with your health care provider. Document Revised: 06/23/2018 Document Reviewed: 06/23/2018 Elsevier Patient Education  2020 Elsevier Inc.  

## 2020-10-01 NOTE — Progress Notes (Addendum)
Progress Note  Patient Name: Ian [name] Date of Encounter: 10/01/2020  [facility] Cardiologist: Sherren Mocha, MD   Subjective   No further chest pain since admission.  He is very anxious to go home.  Has been walking in the hall with no angina.   Inpatient Medications    Scheduled Meds: . diphenhydrAMINE  25 mg Oral QHS   And  . acetaminophen  500 mg Oral QHS  . aspirin EC  81 mg Oral Daily  . Chlorhexidine Gluconate Cloth  6 each Topical Daily  . dapagliflozin propanediol  10 mg Oral Daily  . diltiazem  360 mg Oral Daily  . hydrochlorothiazide  25 mg Oral Daily  . icosapent Ethyl  2 g Oral BID  . insulin aspart  0-15 Units Subcutaneous TID WC  . insulin glargine  40 Units Subcutaneous q1800  . isosorbide mononitrate  30 mg Oral QPM  . isosorbide mononitrate  60 mg Oral Daily  . linagliptin  5 mg Oral Daily  . metoprolol succinate  100 mg Oral BID  . pantoprazole  40 mg Oral Daily  . ramipril  10 mg Oral Daily  . ranolazine  500 mg Oral BID  . rosuvastatin  40 mg Oral QHS  . sodium chloride flush  10-40 mL Intracatheter Q12H   Continuous Infusions: . heparin 1,200 Units/hr (10/01/20 0617)   PRN Meds: acetaminophen **OR** acetaminophen, hydrALAZINE, nitroGLYCERIN, ondansetron **OR** ondansetron (ZOFRAN) IV   Vital Signs    Vitals:   09/30/20 1825 09/30/20 2219 10/01/20 0443 10/01/20 0836  BP: (!) 119/49 137/64 (!) 162/73 131/60  Pulse:  80 86 86  Resp: 18 19 20    Temp: 98.4 F (36.9 C) 98.5 F (36.9 C) 97.9 F (36.6 C)   TempSrc: Oral Oral Oral   SpO2: 99% 97% 99%   Weight:      Height:        Intake/Output Summary (Last 24 hours) at 10/01/2020 1057 Last data filed at 10/01/2020 0300 Gross per 24 hour  Intake 1292.11 ml  Output --  Net 1292.11 ml   Last 3 Weights 09/30/2020 08/15/2020 07/26/2020  Weight (lbs) 173 lb 176 lb 181 lb  Weight (kg) 78.472 kg 79.833 kg 82.101 kg      Telemetry    NSR - Personally Reviewed  ECG    No new  EKG to review - Personally Reviewed  Physical Exam   GEN: No acute distress.   Neck: No JVD Cardiac: RRR, no murmurs, rubs, or gallops.  Respiratory: Clear to auscultation bilaterally. GI: Soft, nontender, non-distended  MS: No edema; No deformity. Neuro:  Nonfocal  Psych: Normal affect   Labs    High Sensitivity Troponin:   Recent Labs  Lab 09/30/20 1007 09/30/20 1155  TROPONINIHS 268* 241*      Chemistry Recent Labs  Lab 09/28/20 1300 09/30/20 1007 10/01/20 0442  NA  --  133* 137  K  --  4.0 3.3*  CL  --  96* 102  CO2  --  24 25  GLUCOSE  --  592* 197*  BUN  --  23 17  CREATININE 1.00 1.17 0.96  CALCIUM  --  9.3 9.4  GFRNONAA  --  >60 >60  ANIONGAP  --  13 10     Hematology Recent Labs  Lab 09/30/20 1007 10/01/20 0442  WBC 11.3* 11.1*  RBC 4.68 4.35  HGB 12.0* 11.2*  HCT 37.5* 34.3*  MCV 80.1 78.9*  MCH 25.6* 25.7*  MCHC 32.0 32.7  RDW 14.4 14.1  PLT 281 276    BNPNo results for input(s): BNP, PROBNP in the last 168 hours.   DDimer  Recent Labs  Lab 09/30/20 1155  DDIMER 1.74*      Radiology    DG Chest 2 View  Result Date: 09/30/2020 CLINICAL DATA:  Chest pain EXAM: CHEST - 2 VIEW COMPARISON:  01/03/2019 FINDINGS: Cardiac shadow is within normal limits. Postoperative changes are seen. Left chest wall port is again noted and stable. Aortic calcifications are seen. The lungs are well aerated bilaterally. No focal infiltrate or sizable effusion is seen. No acute bony abnormality is noted. IMPRESSION: No acute abnormality noted. Electronically Signed   By: Inez Catalina M.D.   On: 09/30/2020 10:27   ECHOCARDIOGRAM COMPLETE  Result Date: 09/30/2020    ECHOCARDIOGRAM REPORT   Patient Name:   Ian [name] Date of Exam: 09/30/2020 Medical Rec #:  [id number]        Height:       64.0 in Accession #:    [id number]       Weight:       173.0 lb Date of Birth:  March 17, 1956        BSA:          1.839 m Patient Age:    64 years         BP:            120/56 mmHg Patient Gender: M                HR:           81 bpm. Exam Location:  Inpatient Procedure: 2D Echo, Cardiac Doppler, Color Doppler and Intracardiac            Opacification Agent Indications:    Elevated Troponin  History:        Patient has prior history of Echocardiogram examinations, most                 recent 01/04/2020. Cardiomyopathy, CAD, Arrythmias:Atrial                 Fibrillation; Risk Factors:Hypertension, Dyslipidemia and                 Diabetes.  Sonographer:    Bernadene Person RDCS Referring Phys: [address] [place]  1. Left ventricular ejection fraction, by estimation, is 40 to 45%. The left ventricle has mildly decreased function. The left ventricle demonstrates global hypokinesis. Left ventricular diastolic parameters are consistent with Grade II diastolic dysfunction (pseudonormalization).  2. Right ventricular systolic function is normal. The right ventricular size is normal. There is normal pulmonary artery systolic pressure. The estimated right ventricular systolic pressure is 66.4 mmHg.  3. Left atrial size was moderately dilated.  4. The mitral valve is normal in structure. Mild mitral valve regurgitation. No evidence of mitral stenosis.  5. The aortic valve is tricuspid. Aortic valve regurgitation is not visualized. Mild aortic valve stenosis. Aortic valve mean gradient measures 11.0 mmHg.  6. The inferior vena cava is dilated in size with >50% respiratory variability, suggesting right atrial pressure of 8 mmHg.  7. Technically difficult study with very poor images even with Definity. For more accurate EF determination, would consider cardiac MR. FINDINGS  Left Ventricle: Left ventricular ejection fraction, by estimation, is 40 to 45%. The left ventricle has mildly decreased function. The left ventricle demonstrates global hypokinesis. Definity contrast agent was given IV to delineate  the left ventricular  endocardial borders. The left ventricular internal cavity  size was normal in size. There is no left ventricular hypertrophy. Left ventricular diastolic parameters are consistent with Grade II diastolic dysfunction (pseudonormalization). Right Ventricle: The right ventricular size is normal. No increase in right ventricular wall thickness. Right ventricular systolic function is normal. There is normal pulmonary artery systolic pressure. The tricuspid regurgitant velocity is 2.59 m/s, and  with an assumed right atrial pressure of 8 mmHg, the estimated right ventricular systolic pressure is 27.0 mmHg. Left Atrium: Left atrial size was moderately dilated. Right Atrium: Right atrial size was normal in size. Pericardium: There is no evidence of pericardial effusion. Mitral Valve: The mitral valve is normal in structure. There is mild calcification of the mitral valve leaflet(s). Mild mitral annular calcification. Mild mitral valve regurgitation. No evidence of mitral valve stenosis. Tricuspid Valve: The tricuspid valve is normal in structure. Tricuspid valve regurgitation is trivial. Aortic Valve: The aortic valve is tricuspid. Aortic valve regurgitation is not visualized. Mild aortic stenosis is present. Aortic valve mean gradient measures 11.0 mmHg. Aortic valve peak gradient measures 18.1 mmHg. Aortic valve area, by VTI measures 2.46 cm. Pulmonic Valve: The pulmonic valve was normal in structure. Pulmonic valve regurgitation is not visualized. Aorta: The aortic root is normal in size and structure. Venous: The inferior vena cava is dilated in size with greater than 50% respiratory variability, suggesting right atrial pressure of 8 mmHg. IAS/Shunts: No atrial level shunt detected by color flow Doppler.  LEFT VENTRICLE PLAX 2D LVIDd:         4.80 cm     Diastology LVIDs:         3.40 cm     LV e' medial:    5.78 cm/s LV PW:         1.10 cm     LV E/e' medial:  21.8 LV IVS:        1.10 cm     LV e' lateral:   7.53 cm/s LVOT diam:     2.20 cm     LV E/e' lateral: 16.7 LV SV:          111 LV SV Index:   60 LVOT Area:     3.80 cm  LV Volumes (MOD) LV vol d, MOD A2C: 80.2 ml LV vol d, MOD A4C: 93.2 ml LV vol s, MOD A2C: 40.0 ml LV vol s, MOD A4C: 40.2 ml LV SV MOD A2C:     40.2 ml LV SV MOD A4C:     93.2 ml LV SV MOD BP:      46.0 ml RIGHT VENTRICLE RV S prime:     9.39 cm/s RVOT diam:      2.60 cm TAPSE (M-mode): 1.5 cm LEFT ATRIUM             Index       RIGHT ATRIUM           Index LA diam:        4.50 cm 2.45 cm/m  RA Area:     16.90 cm LA Vol (A2C):   81.4 ml 44.25 ml/m RA Volume:   44.40 ml  24.14 ml/m LA Vol (A4C):   72.5 ml 39.41 ml/m LA Biplane Vol: 77.9 ml 42.35 ml/m  AORTIC VALVE                    PULMONIC VALVE AV Area (Vmax):    2.32 cm  RVOT Peak grad: 2 mmHg AV Area (Vmean):   2.40 cm AV Area (VTI):     2.46 cm AV Vmax:           212.50 cm/s AV Vmean:          154.000 cm/s AV VTI:            0.450 m AV Peak Grad:      18.1 mmHg AV Mean Grad:      11.0 mmHg LVOT Vmax:         129.67 cm/s LVOT Vmean:        97.400 cm/s LVOT VTI:          0.292 m LVOT/AV VTI ratio: 0.65  AORTA Ao Root diam: 3.40 cm Ao Asc diam:  2.90 cm MITRAL VALVE                TRICUSPID VALVE MV Area (PHT): 4.80 cm     TR Peak grad:   26.8 mmHg MV Decel Time: 158 msec     TR Vmax:        259.00 cm/s MV E velocity: 126.00 cm/s MV A velocity: 117.00 cm/s  SHUNTS MV E/A ratio:  1.08         Systemic VTI:  0.29 m                             Systemic Diam: 2.20 cm                             Pulmonic VTI:  0.188 m                             Pulmonic Diam: 2.60 cm                             Qp/Qs:         0.90 Loralie Champagne MD Electronically signed by Loralie Champagne MD Signature Date/Time: 09/30/2020/3:59:48 PM    Final     Cardiac Studies   2D echo 09/30/2020 IMPRESSIONS    1. Left ventricular ejection fraction, by estimation, is 40 to 45%. The  left ventricle has mildly decreased function. The left ventricle  demonstrates global hypokinesis. Left ventricular diastolic parameters are   consistent with Grade II diastolic  dysfunction (pseudonormalization).  2. Right ventricular systolic function is normal. The right ventricular  size is normal. There is normal pulmonary artery systolic pressure. The  estimated right ventricular systolic pressure is 74.9 mmHg.  3. Left atrial size was moderately dilated.  4. The mitral valve is normal in structure. Mild mitral valve  regurgitation. No evidence of mitral stenosis.  5. The aortic valve is tricuspid. Aortic valve regurgitation is not  visualized. Mild aortic valve stenosis. Aortic valve mean gradient  measures 11.0 mmHg.  6. The inferior vena cava is dilated in size with >50% respiratory  variability, suggesting right atrial pressure of 8 mmHg.  7. Technically difficult study with very poor images even with Definity.  For more accurate EF determination, would consider cardiac MR.    Cardiac Cath 01/04/2020 Conclusion  1.  Severe multivessel coronary artery disease with chronic total occlusion of the LAD and RCA, continued patency of the left mainstem and left circumflex stents with focal mild to moderate in-stent restenosis at  the transition of the left main into the circumflex. 2.  Status post aortocoronary bypass surgery with continued patency of the LIMA to LAD graft and severe aorto ostial stenosis of the radial graft to PDA treated successfully with PCI using a 3.0 x 30 mm resolute Onyx DES with intravascular ultrasound guidance 3.  Nonobstructive ostial left circumflex restenosis as demonstrated by intravascular ultrasound with a minimal lumen area of 5.96 mm  Recommendations: Long-term dual antiplatelet therapy with aspirin and Effient, continued aggressive medical therapy, 2D echocardiogram for assessment of LV systolic function.  Patient Profile     64 y.o. male with a hx of CAD s/p CABG 2008, STEMI 08/2019 s/p DES x2 to L radial-PDA with DES x 2 to protected LM into LCx, inf-lat STEMI 12/2019 s/p DES to L  radial-PDA, DM, GERD, HTN, HLD, ICM, PAD s/p R fempop BPG 02/2020, PAF (noted after FPBPG), colon CA (prior mets to liver s/p partial colectomy and chemotherapy with recent concern for new hepatic/pulmonary metastatic disease) who is being seen for the evaluation of chest pain at the request of Dr. Ron Parker  Assessment & Plan    1. Acceleration of known angina superimposed on CAD s/p prior CABG, PCIs as above, with elevated troponin - at baseline, the patient has chronic angina precipitated with any exertion beyond ADLs and also has history of worsening chest pain in the context of tachycardia - presents with worsening chest pain, elevated but flat troponin in the context of physiologic stressors of sinus tachycardia, marked hyperglycemia, and LLQ pain with underlying new metastatic disease - elevated troponin may reflect demand ischemia  - continue ASA, metoprolol and statin - started on Ranolazine 500mg  BID yesterday - continue long acting Imdur increased to 60mg  qam and 30mg  qpm and increase further if needed for breakthrough angina as outpt -he is VERY anxious to go home today and has been walking in the halls with no angina  2. Worsening hyperglycemia  - admits to not taking insulin in the context of multiple stressors - may be driving metabolic demand - appreciate IM management  3. Interval development of new hepatic, pulmonary and peritoneal soft tissue nodules (with prior history of colon CA) - consider input from oncology while he is bridged from anticoagulant to heparin - d-dimer elevated which is nonspecific in CA; will defer to primary team whether to pursue eval for PE  4. Ischemic cardiomyopathy - volume status looks OK - f/u echo with EF 40-45% but very poor images and cardiac MRI recommended for accurate assessment of EF>>this can be done as outpt  5. Mild AS/MR -Mild AS with mean AVG 35mmHg by echo this admit -mild MR by echo this admit  6. Paroxysmal atrial  fibrillation (in NSR) - Eliquis on hold for heparin for now >> restart when ok with TRH and oncology - maintaining NSR on tele - continue BB/CCB   7. HTN - BP controlled at 131/66mmhg - follow BP with home regimen including Cardizem CD 360mg  daily, HCTZ 25mg  daily, Toprol XL 100mg  BID - TSH very low at <0.01 - check full TFTs   8.  HLD - Continue Crestor.  - Zetia caused GI issues.  - PCSK9 authorization underway as OP     I have spent a total of 35 minutes with patient reviewing cardiac cath, 2D echo , telemetry, EKGs, labs and examining patient as well as establishing an assessment and plan that was discussed with the patient.  > 50% of time was spent in direct  patient care.    For questions or updates, please contact [place] Please consult [email or web address] for contact info under        Signed, Fransico Him, MD  10/01/2020, 10:57 AM

## 2020-10-01 NOTE — Progress Notes (Signed)
Pt IV removed, catheter intact and telemetry removed, CCMD aware Pt has all belongings  Pt discharge education went over at bedside with pt and pt wife Awaiting IV team to deaccess port prior to discharge

## 2020-10-02 LAB — T3, FREE: T3, Free: 4.1 pg/mL (ref 2.0–4.4)

## 2020-10-06 ENCOUNTER — Other Ambulatory Visit: Payer: Self-pay

## 2020-10-06 ENCOUNTER — Ambulatory Visit (HOSPITAL_COMMUNITY)
Admission: RE | Admit: 2020-10-06 | Discharge: 2020-10-06 | Disposition: A | Discharge status: Home / Self Care | Payer: Federal, State, Local not specified - PPO | Source: Ambulatory Visit | Attending: Hematology | Admitting: Hematology

## 2020-10-06 DIAGNOSIS — R160 Hepatomegaly, not elsewhere classified: Secondary | ICD-10-CM

## 2020-10-06 DIAGNOSIS — J984 Other disorders of lung: Secondary | ICD-10-CM

## 2020-10-06 DIAGNOSIS — I358 Other nonrheumatic aortic valve disorders: Secondary | ICD-10-CM | POA: Diagnosis not present

## 2020-10-06 DIAGNOSIS — C187 Malignant neoplasm of sigmoid colon: Secondary | ICD-10-CM | POA: Diagnosis not present

## 2020-10-06 DIAGNOSIS — C7800 Secondary malignant neoplasm of unspecified lung: Secondary | ICD-10-CM | POA: Diagnosis not present

## 2020-10-06 DIAGNOSIS — I2510 Atherosclerotic heart disease of native coronary artery without angina pectoris: Secondary | ICD-10-CM | POA: Diagnosis not present

## 2020-10-06 MED ORDER — IOHEXOL 300 MG/ML  SOLN
75.0000 mL | Freq: Once | INTRAMUSCULAR | Status: AC | PRN
  Administered 2020-10-06: 75 mL via INTRAVENOUS

## 2020-10-11 ENCOUNTER — Inpatient Hospital Stay (HOSPITAL_COMMUNITY): Payer: Federal, State, Local not specified - PPO | Attending: Hematology | Admitting: Hematology

## 2020-10-11 ENCOUNTER — Other Ambulatory Visit (HOSPITAL_COMMUNITY): Payer: Federal, State, Local not specified - PPO

## 2020-10-11 ENCOUNTER — Other Ambulatory Visit: Payer: Self-pay

## 2020-10-11 VITALS — BP 149/64 | HR 92 | Temp 97.3°F | Resp 18 | Wt 177.5 lb

## 2020-10-11 DIAGNOSIS — Z7189 Other specified counseling: Secondary | ICD-10-CM | POA: Insufficient documentation

## 2020-10-11 DIAGNOSIS — Z9221 Personal history of antineoplastic chemotherapy: Secondary | ICD-10-CM | POA: Insufficient documentation

## 2020-10-11 DIAGNOSIS — I350 Nonrheumatic aortic (valve) stenosis: Secondary | ICD-10-CM | POA: Insufficient documentation

## 2020-10-11 DIAGNOSIS — I480 Paroxysmal atrial fibrillation: Secondary | ICD-10-CM | POA: Insufficient documentation

## 2020-10-11 DIAGNOSIS — E119 Type 2 diabetes mellitus without complications: Secondary | ICD-10-CM | POA: Diagnosis not present

## 2020-10-11 DIAGNOSIS — I25810 Atherosclerosis of coronary artery bypass graft(s) without angina pectoris: Secondary | ICD-10-CM | POA: Diagnosis not present

## 2020-10-11 DIAGNOSIS — I340 Nonrheumatic mitral (valve) insufficiency: Secondary | ICD-10-CM | POA: Diagnosis not present

## 2020-10-11 DIAGNOSIS — K219 Gastro-esophageal reflux disease without esophagitis: Secondary | ICD-10-CM | POA: Insufficient documentation

## 2020-10-11 DIAGNOSIS — E785 Hyperlipidemia, unspecified: Secondary | ICD-10-CM | POA: Diagnosis not present

## 2020-10-11 DIAGNOSIS — I255 Ischemic cardiomyopathy: Secondary | ICD-10-CM | POA: Diagnosis not present

## 2020-10-11 DIAGNOSIS — C187 Malignant neoplasm of sigmoid colon: Secondary | ICD-10-CM | POA: Diagnosis not present

## 2020-10-11 DIAGNOSIS — Z951 Presence of aortocoronary bypass graft: Secondary | ICD-10-CM | POA: Diagnosis not present

## 2020-10-11 DIAGNOSIS — I10 Essential (primary) hypertension: Secondary | ICD-10-CM | POA: Diagnosis not present

## 2020-10-11 DIAGNOSIS — I252 Old myocardial infarction: Secondary | ICD-10-CM | POA: Insufficient documentation

## 2020-10-11 DIAGNOSIS — C787 Secondary malignant neoplasm of liver and intrahepatic bile duct: Secondary | ICD-10-CM | POA: Insufficient documentation

## 2020-10-11 DIAGNOSIS — F1520 Other stimulant dependence, uncomplicated: Secondary | ICD-10-CM | POA: Diagnosis not present

## 2020-10-11 DIAGNOSIS — Z7982 Long term (current) use of aspirin: Secondary | ICD-10-CM | POA: Insufficient documentation

## 2020-10-11 DIAGNOSIS — I739 Peripheral vascular disease, unspecified: Secondary | ICD-10-CM | POA: Diagnosis not present

## 2020-10-11 DIAGNOSIS — Z79899 Other long term (current) drug therapy: Secondary | ICD-10-CM | POA: Diagnosis not present

## 2020-10-11 NOTE — Patient Instructions (Signed)
[place] at [facility] Discharge Instructions  You were seen today by Dr. Delton Coombes. He went over your recent results and scans; your cancer has spread into your lungs and liver. You will be restarted on your previous chemotherapy treatment, FOLFIRI and Avastin. Dr. Delton Coombes will see you back in 2 weeks for labs and initiating treatment.   Thank you for choosing [place] at [facility] to provide your oncology and hematology care.  To afford each patient quality time with our provider, please arrive at least 15 minutes before your scheduled appointment time.   If you have a lab appointment with the [facility] please come in thru the Main Entrance and check in at the main information desk  You need to re-schedule your appointment should you arrive 10 or more minutes late.  We strive to give you quality time with our providers, and arriving late affects you and other patients whose appointments are after yours.  Also, if you no show three or more times for appointments you may be dismissed from the clinic at the providers discretion.     Again, thank you for choosing [facility].  Our hope is that these requests will decrease the amount of time that you wait before being seen by our physicians.       _____________________________________________________________  Should you have questions after your visit to [facility], please contact our office at [phone] between the hours of 8:00 a.m. and 4:30 p.m.  Voicemails left after 4:00 p.m. will not be returned until the following business day.  For prescription refill requests, have your pharmacy contact our office and allow 72 hours.    Cancer Center Support Programs:   > Cancer Support Group  2nd Tuesday of the month 1pm-2pm, Journey Room

## 2020-10-11 NOTE — Progress Notes (Signed)
[place],  [id number]   CLINIC:  Medical Oncology/Hematology  PCP:  Sharilyn Sites, [place] / [place] [address] [phone]   REASON FOR VISIT:  Follow-up for metastatic sigmoid colon cancer to the liver  PRIOR THERAPY:  1. Sigmoid colon segmental resection on 09/17/2018. 2. FOLFIRI and Avastin x 10 cycles from 11/03/2018 to 03/10/2019.  NGS Results: Foundation 1 MS--stable, TMB low, NRAS Q61R  CURRENT THERAPY: Observation  BRIEF ONCOLOGIC HISTORY:  Oncology History  Malignant neoplasm of sigmoid colon ([facility])  09/17/2018 Initial Diagnosis   Malignant neoplasm of sigmoid colon ([facility])   11/03/2018 -  Chemotherapy   The patient had palonosetron (ALOXI) injection 0.25 mg, 0.25 mg, Intravenous,  Once, 10 of 12 cycles Administration: 0.25 mg (11/03/2018), 0.25 mg (11/18/2018), 0.25 mg (12/01/2018), 0.25 mg (12/15/2018), 0.25 mg (12/30/2018), 0.25 mg (01/13/2019), 0.25 mg (01/27/2019), 0.25 mg (02/10/2019), 0.25 mg (02/24/2019), 0.25 mg (03/10/2019) bevacizumab (AVASTIN) 450 mg in sodium chloride 0.9 % 100 mL chemo infusion, 5 mg/kg = 450 mg, Intravenous,  Once, 10 of 12 cycles Administration: 450 mg (11/03/2018), 450 mg (11/18/2018), 450 mg (12/01/2018), 450 mg (12/15/2018), 450 mg (01/13/2019), 450 mg (01/27/2019), 450 mg (02/10/2019), 450 mg (02/24/2019), 450 mg (03/10/2019) irinotecan (CAMPTOSAR) 360 mg in sodium chloride 0.9 % 500 mL chemo infusion, 180 mg/m2 = 360 mg, Intravenous,  Once, 10 of 12 cycles Administration: 360 mg (11/03/2018), 360 mg (11/18/2018), 360 mg (12/01/2018), 360 mg (12/15/2018), 360 mg (12/30/2018), 360 mg (01/13/2019), 360 mg (01/27/2019), 360 mg (02/10/2019), 360 mg (02/24/2019), 360 mg (03/10/2019) leucovorin 800 mg in sodium chloride 0.9 % 250 mL infusion, 808 mg, Intravenous,  Once, 10 of 12 cycles Administration: 800 mg (11/03/2018), 800 mg (11/18/2018), 800 mg (12/01/2018), 800 mg (12/15/2018), 800 mg (12/30/2018), 800  mg (01/13/2019), 800 mg (01/27/2019), 800 mg (02/10/2019), 800 mg (02/24/2019), 800 mg (03/10/2019) fosaprepitant (EMEND) 150 mg in sodium chloride 0.9 % 145 mL IVPB, 150 mg, Intravenous,  Once, 1 of 1 cycle Administration: 150 mg (12/01/2018) fluorouracil (ADRUCIL) chemo injection 800 mg, 400 mg/m2 = 800 mg, Intravenous,  Once, 10 of 12 cycles Administration: 800 mg (11/03/2018), 800 mg (11/18/2018), 800 mg (12/01/2018), 800 mg (12/15/2018), 800 mg (12/30/2018), 800 mg (01/13/2019), 800 mg (01/27/2019), 800 mg (02/10/2019), 800 mg (02/24/2019), 800 mg (03/10/2019) fosaprepitant (EMEND) 150 mg, dexamethasone (DECADRON) 10 mg in sodium chloride 0.9 % 145 mL IVPB, , Intravenous,  Once, 7 of 9 cycles Administration:  (12/15/2018),  (12/30/2018),  (01/13/2019),  (01/27/2019),  (02/10/2019),  (02/24/2019),  (03/10/2019) fluorouracil (ADRUCIL) 4,850 mg in sodium chloride 0.9 % 53 mL chemo infusion, 2,400 mg/m2 = 4,850 mg, Intravenous, 1 Day/Dose, 10 of 12 cycles Administration: 4,850 mg (11/03/2018), 4,850 mg (11/18/2018), 4,850 mg (12/01/2018), 4,850 mg (12/15/2018), 4,850 mg (12/30/2018), 4,850 mg (01/13/2019), 4,850 mg (01/27/2019), 4,850 mg (02/10/2019), 5,000 mg (02/24/2019), 5,000 mg (03/10/2019)  for chemotherapy treatment.      CANCER STAGING: Cancer Staging No matching staging information was found for the patient.  INTERVAL HISTORY:  Mr. KHAMARION BJELLAND, a 64 y.o. male, returns for routine follow-up of his metastatic sigmoid colon cancer to the liver. Dom was last seen on 05/07/2019.   Today he is accompanied by his wife and he reports feeling well. He is not having routine follow-ups with [facility]. He reports having an intermittent sore spot in his left flank, but no other abdominal pain. He reports having 2 MI's since his last visit and ongoing chest pain with  exertion; he is on Eliquis and ASA 81. He continues having numbness in his feet which is improving since his fem-pop bypass on 02/25/2020. He denies having any issues  with his BM's or urination. He is eating less due to his decreased appetite and has lost 15 lbs in the last 3 months.  He is open to going back on chemo for his colon cancer. He tolerated the previous chemotherapy rather well.   REVIEW OF SYSTEMS:  Review of Systems  Constitutional: Positive for appetite change (50%), fatigue (75%) and unexpected weight change (lost 15 lbs in 3 months).  Cardiovascular: Positive for chest pain.  Gastrointestinal: Positive for abdominal pain (LLQ pain). Negative for constipation and diarrhea.  Genitourinary: Negative for difficulty urinating.   Neurological: Positive for numbness (feet).  All other systems reviewed and are negative.   PAST MEDICAL/SURGICAL HISTORY:  Past Medical History:  Diagnosis Date  . 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 Colon cancer ([facility])   . Complication of anesthesia    atypical pseudo-cholinesterase deficiency  . Coronary artery disease 01/10/2013   s/p CABG 2008 // s/p STEMI in 08/2019>>DES x 2 to L radial-PDA; staged PCI: DES x 2 to protected LM into LCx // s/p inf-lat STEMI 12/2019 >> DES to L Radial-PDA  . Diabetes mellitus   . Family Hx of adverse reaction to anesthesia    sister also has atypical pseudo-cholinesterase deficiency  . Gastroesophageal reflux disease   . History of heart bypass surgery   . HLD (hyperlipidemia)   . Hypertension   . Ischemic cardiomyopathy    Echo 01/04/2020: EF 45-50, mild LVH, Gr 2 DD, normal RVSF, mild to mod LAE, mild MR, mild AS (mean gradient 6 mmHg)  . Mild aortic stenosis   . Mild mitral regurgitation   . Myocardial infarction ([facility])   . PAD (peripheral artery disease) ([facility])    s/p R fem-pop bypass 02/2020 (Dr. Donnetta Hutching)  . Paroxysmal atrial fibrillation ([facility])    CHADS-VASc=3 // noted post op after fem pop bypass in 02/2020; due to high risk, Apixaban started   Past Surgical History:  Procedure Laterality Date  . ABDOMINAL AORTOGRAM W/LOWER EXTREMITY Bilateral 02/18/2020    Procedure: ABDOMINAL AORTOGRAM W/LOWER EXTREMITY;  Surgeon: Marty Heck, MD;  Location: [facility];  Service: Cardiovascular;  Laterality: Bilateral;  . COLONOSCOPY N/A 08/29/2018   Procedure: COLONOSCOPY;  Surgeon: Daneil Dolin, MD;  [facility];  Service: Endoscopy;  Laterality: N/A;  9:30  . CORONARY ARTERY BYPASS GRAFT     4 vessels  . CORONARY ATHERECTOMY N/A 09/15/2019   Procedure: CORONARY ATHERECTOMY;  Surgeon: Sherren Mocha, MD;  Location: [facility];  Service: Cardiovascular;  LM-LCx Atherectomy - DES PCI (*Protected LM)   . CORONARY STENT INTERVENTION N/A 09/15/2019   Procedure: CORONARY STENT INTERVENTION;  Surgeon: Sherren Mocha, MD;  Location: [facility];; Successful orbital atherectomy, PTCA, and overlapping DES treatment of severe calcific stenosis in the left main and left circumflex using a 2.5x34 mm Resolute Onyx DES (left circumflex) and 3.0x30 mm Resolute Onyx DES (overlapped proximally in the LCx extending into left main)  . CORONARY STENT INTERVENTION N/A 01/04/2020   Procedure: CORONARY STENT INTERVENTION;  Surgeon: Sherren Mocha, MD;  Location: [facility];  Service: Cardiovascular;  Laterality: N/A;  . CORONARY/GRAFT ACUTE MI REVASCULARIZATION N/A 09/14/2019   Procedure: Coronary/Graft Acute MI Revascularization;  Surgeon: Sherren Mocha, MD;  Location: [facility];  Service: Cardiovascular;  Laterality:  N/A;  . FEMORAL-POPLITEAL BYPASS GRAFT Right 02/25/2020   Procedure: FEMORAL-ABOVE KNEE POPLITEAL ARTERY BYPASS GRAFT with Saphenous vein;  Surgeon: Rosetta Posner, MD;  Location: [facility];  Service: Vascular;  Laterality: Right;  . heart bypass    . INSERTION OF MESH  02/07/2015   Procedure: INSERTION OF MESH;  Surgeon: Aviva Signs Md, MD;  Location: [facility];  Service: General;;  . INTRAVASCULAR ULTRASOUND/IVUS N/A 01/04/2020   Procedure: Intravascular Ultrasound/IVUS;  Surgeon: Sherren Mocha, MD;  Location:  [facility];  Service: Cardiovascular;  Laterality: N/A;  . LEFT HEART CATH AND CORONARY ANGIOGRAPHY N/A 09/14/2019   Procedure: LEFT HEART CATH AND CORONARY ANGIOGRAPHY;  Surgeon: Sherren Mocha, MD;  Location: [facility];  Service: Cardiovascular;  Laterality: N/A;  . LEFT HEART CATH AND CORONARY ANGIOGRAPHY N/A 01/04/2020   Procedure: LEFT HEART CATH AND CORONARY ANGIOGRAPHY;  Surgeon: Sherren Mocha, MD;  Location: [facility];  Service: Cardiovascular;  Laterality: N/A;  . PARTIAL COLECTOMY N/A 09/17/2018   Procedure: PARTIAL COLECTOMY WITH PARTIAL WEDGE RESECTION LIVER METASTASIS;  Surgeon: Aviva Signs, MD;  Location: [facility];  Service: General;  Laterality: N/A;  . POLYPECTOMY  08/29/2018   Procedure: POLYPECTOMY;  Surgeon: Daneil Dolin, MD;  [facility];  Service: Endoscopy;;  . PORTACATH PLACEMENT Left 10/10/2018   Procedure: INSERTION PORT-A-CATH (catheter in left subclavian);  Surgeon: Aviva Signs, MD;  Location: [facility];  Service: General;  Laterality: Left;  . UMBILICAL HERNIA REPAIR N/A 02/07/2015   Procedure: UMBILICAL HERNIORRHAPHY WITH MESH;  Surgeon: Aviva Signs Md, MD;  Location: [facility];  Service: General;  Laterality: N/A;    SOCIAL HISTORY:  Social History   Socioeconomic History  . Marital status: Married    Spouse name: Not on file  . Number of children: 3  . Years of education: Not on file  . Highest education level: Not on file  Occupational History  . Occupation: Programmer, systems: [employer]  Tobacco Use  . Smoking status: Never Smoker  . Smokeless tobacco: Never Used  Vaping Use  . Vaping Use: Never used  Substance and Sexual Activity  . Alcohol use: No  . Drug use: No  . Sexual activity: Yes    Birth control/protection: None  Other Topics Concern  . Not on file  Social History Narrative  . Not on file   Social Determinants of Health   Financial Resource Strain: Low Risk   .  Difficulty of Paying Living Expenses: Not hard at all  Food Insecurity: No Food Insecurity  . Worried About Charity fundraiser in the Last Year: Never true  . Ran Out of Food in the Last Year: Never true  Transportation Needs: No Transportation Needs  . Lack of Transportation (Medical): No  . Lack of Transportation (Non-Medical): No  Physical Activity: Inactive  . Days of Exercise per Week: 0 days  . Minutes of Exercise per Session: 0 min  Stress: No Stress Concern Present  . Feeling of Stress : Not at all  Social Connections: Moderately Integrated  . Frequency of Communication with Friends and Family: More than three times a week  . Frequency of Social Gatherings with Friends and Family: Three times a week  . Attends Religious Services: Never  . Active Member of Clubs or Organizations: No  . Attends Archivist Meetings: 1 to 4 times per year  . Marital Status: Married  Human resources officer Violence:  Not At Risk  . Fear of Current or Ex-Partner: No  . Emotionally Abused: No  . Physically Abused: No  . Sexually Abused: No    FAMILY HISTORY:  Family History  Problem Relation Age of Onset  . Hypertension Father   . Diabetes Father   . Heart disease Father   . Diabetes Sister   . Hypertension Sister   . Hypertension Brother   . Diabetes Brother   . Diabetes Sister   . Diabetes Brother   . Heart disease Brother   . Lymphoma Brother   . Hypertension Son     CURRENT MEDICATIONS:  Current Outpatient Medications  Medication Sig Dispense Refill  . apixaban (ELIQUIS) 5 MG TABS tablet Take 1 tablet (5 mg total) by mouth 2 (two) times daily. 60 tablet 5  . aspirin EC 81 MG EC tablet Take 1 tablet (81 mg total) by mouth daily. Swallow whole. 90 tablet 0  . Coenzyme Q10 (COQ10) 100 MG CAPS Take 100 mg by mouth daily.    Marland Kitchen diltiazem (CARDIZEM CD) 360 MG 24 hr capsule Take 1 capsule (360 mg total) by mouth daily. 90 capsule 3  . diphenhydramine-acetaminophen (TYLENOL PM)  25-500 MG TABS tablet Take 2 tablets by mouth at bedtime.    . Evolocumab (REPATHA SURECLICK) 379 MG/ML SOAJ Inject 140 mg into the skin every 14 (fourteen) days. 2 mL 11  . ezetimibe-simvastatin (VYTORIN) 10-80 MG tablet Take 1 tablet by mouth daily.     Marland Kitchen FARXIGA 10 MG TABS tablet Take 10 mg by mouth daily.     . hydrochlorothiazide (HYDRODIURIL) 25 MG tablet Take 1 tablet (25 mg total) by mouth daily. 90 tablet 3  . icosapent Ethyl (VASCEPA) 1 g capsule Take 2 capsules (2 g total) by mouth 2 (two) times daily. 360 capsule 3  . insulin aspart protamine- aspart (NOVOLOG MIX 70/30) (70-30) 100 UNIT/ML injection Inject 20 Units into the skin in the morning, at noon, and at bedtime.     . Insulin Glargine (BASAGLAR KWIKPEN) 100 UNIT/ML SOPN Inject 40 Units into the skin at bedtime.     . isosorbide mononitrate (IMDUR) 30 MG 24 hr tablet Take 1 tablet (30 mg total) by mouth every evening. 90 tablet 0  . isosorbide mononitrate (IMDUR) 60 MG 24 hr tablet Take 1 tablet (60 mg total) by mouth daily. 90 tablet 3  . JANUVIA 100 MG tablet Take 100 mg by mouth daily.     . metFORMIN (GLUCOPHAGE) 500 MG tablet Take 500 mg by mouth 2 (two) times daily.    . metoprolol succinate (TOPROL-XL) 100 MG 24 hr tablet Take 1 tablet (100 mg total) by mouth 2 (two) times daily. Take with or immediately following a meal. 60 tablet 1  . nitroGLYCERIN (NITROSTAT) 0.4 MG SL tablet Place 1 tablet (0.4 mg total) under the tongue every 5 (five) minutes as needed for chest pain. 20 tablet 0  . pantoprazole (PROTONIX) 40 MG tablet Take 40 mg by mouth daily.    . ramipril (ALTACE) 10 MG capsule Take 10 mg by mouth daily.    . ranolazine (RANEXA) 500 MG 12 hr tablet Take 1 tablet (500 mg total) by mouth 2 (two) times daily. 180 tablet 0  . rosuvastatin (CRESTOR) 40 MG tablet Take 1 tablet (40 mg total) by mouth at bedtime. 90 tablet 0   No current facility-administered medications for this visit.    ALLERGIES:  Allergies   Allergen Reactions  . Anectine [Succinylcholine] Other (  See Comments)    Atypical pseudocholinesterase deficiency.   . Nsaids Other (See Comments)    Contraindicated (can only have asa 59m)    PHYSICAL EXAM:  Performance status (ECOG): 1 - Symptomatic but completely ambulatory  Vitals:   10/11/20 0950  BP: (!) 149/64  Pulse: 92  Resp: 18  Temp: (!) 97.3 F (36.3 C)  SpO2: 99%   Wt Readings from Last 3 Encounters:  10/11/20 177 lb 8 oz (80.5 kg)  09/30/20 173 lb (78.5 kg)  08/15/20 176 lb (79.8 kg)   Physical Exam Vitals reviewed.  Constitutional:      Appearance: Normal appearance. He is obese.  Cardiovascular:     Rate and Rhythm: Normal rate and regular rhythm.     Pulses: Normal pulses.     Heart sounds: Normal heart sounds.  Pulmonary:     Effort: Pulmonary effort is normal.     Breath sounds: Normal breath sounds.  Abdominal:     Palpations: Abdomen is soft. There is no mass.     Tenderness: There is abdominal tenderness (TTP) in the left lower quadrant.     Hernia: No hernia is present.  Lymphadenopathy:     Upper Body:     Right upper body: No supraclavicular adenopathy.     Left upper body: No supraclavicular adenopathy.  Neurological:     General: No focal deficit present.     Mental Status: He is alert and oriented to person, place, and time.  Psychiatric:        Mood and Affect: Mood normal.        Behavior: Behavior normal.      LABORATORY DATA:  I have reviewed the labs as listed.  CBC Latest Ref Rng & Units 10/01/2020 09/30/2020 05/13/2020  WBC 4.0 - 10.5 K/uL 11.1(H) 11.3(H) 8.7  Hemoglobin 13.0 - 17.0 g/dL 11.2(L) 12.0(L) 12.3(L)  Hematocrit 39 - 52 % 34.3(L) 37.5(L) 38.3  Platelets 150 - 400 K/uL 276 281 273   CMP Latest Ref Rng & Units 10/01/2020 09/30/2020 09/28/2020  Glucose 70 - 99 mg/dL 197(H) 592(HH) -  BUN 8 - 23 mg/dL 17 23 -  Creatinine 0.61 - 1.24 mg/dL 0.96 1.17 1.00  Sodium 135 - 145 mmol/L 137 133(L) -  Potassium 3.5 -  5.1 mmol/L 3.3(L) 4.0 -  Chloride 98 - 111 mmol/L 102 96(L) -  CO2 22 - 32 mmol/L 25 24 -  Calcium 8.9 - 10.3 mg/dL 9.4 9.3 -  Total Protein 6.0 - 8.5 g/dL - - -  Total Bilirubin 0.0 - 1.2 mg/dL - - -  Alkaline Phos 48 - 121 IU/L - - -  AST 0 - 40 IU/L - - -  ALT 0 - 44 IU/L - - -    DIAGNOSTIC IMAGING:  I have independently reviewed the scans and discussed with the patient. DG Chest 2 View  Result Date: 09/30/2020 CLINICAL DATA:  Chest pain EXAM: CHEST - 2 VIEW COMPARISON:  01/03/2019 FINDINGS: Cardiac shadow is within normal limits. Postoperative changes are seen. Left chest wall port is again noted and stable. Aortic calcifications are seen. The lungs are well aerated bilaterally. No focal infiltrate or sizable effusion is seen. No acute bony abnormality is noted. IMPRESSION: No acute abnormality noted. Electronically Signed   By: MInez CatalinaM.D.   On: 09/30/2020 10:27   CT Chest W Contrast  Result Date: 10/08/2020 CLINICAL DATA:  64year old male with history of colorectal cancer. Follow-up study. EXAM: CT CHEST WITH CONTRAST  TECHNIQUE: Multidetector CT imaging of the chest was performed during intravenous contrast administration. CONTRAST:  25m OMNIPAQUE IOHEXOL 300 MG/ML  SOLN COMPARISON:  Chest CT 01/23/2019. CT the abdomen and pelvis 09/28/2020. FINDINGS: Cardiovascular: Heart size is normal. There is no significant pericardial fluid, thickening or pericardial calcification. There is aortic atherosclerosis, as well as atherosclerosis of the great vessels of the mediastinum and the coronary arteries, including calcified atherosclerotic plaque in the left main, left anterior descending, left circumflex and right coronary arteries. Status post median sternotomy for CABG including LIMA to the LAD. Thickening and calcification of the aortic valve. Mediastinum/Nodes: No pathologically enlarged mediastinal or hilar lymph nodes. Esophagus is unremarkable in appearance. Large nodule in the  right lobe of the thyroid gland measuring 3.8 x 2.7 cm (axial image 14 of series 12), similar to the prior examination. Lungs/Pleura: Multiple pulmonary nodules are noted throughout the lungs bilaterally, largest of which is in the left upper lobe (axial image 33 of series 4) measuring up to 1.4 cm in diameter, most compatible with widespread metastatic disease to the lungs. No acute consolidative airspace disease. No pleural effusions. Linear areas of scarring in the lower lobes of the lungs bilaterally. Upper Abdomen: Multiple hypovascular hepatic lesions are noted, suspicious for widespread metastatic disease to the liver, largest of which is in the periphery of segment 8 (axial image 135 of series 2) measuring 3.5 x 3.4 cm. Aortic atherosclerosis. Musculoskeletal: Median sternotomy wires. There are no aggressive appearing lytic or blastic lesions noted in the visualized portions of the skeleton. IMPRESSION: 1. Widespread metastatic disease to the lungs. 2. Hypovascular lesions scattered throughout the liver, similar to the recent prior CT of the abdomen and pelvis, as above, compatible with metastatic disease. 3. Aortic atherosclerosis, in addition to left main and 3 vessel coronary artery disease. Status post median sternotomy for CABG including LIMA to the LAD. 4. There are calcifications of the aortic valve. Echocardiographic correlation for evaluation of potential valvular dysfunction may be warranted if clinically indicated. Aortic Atherosclerosis (ICD10-I70.0). Electronically Signed   By: DVinnie LangtonM.D.   On: 10/08/2020 09:05   CT ABDOMEN PELVIS W CONTRAST  Result Date: 09/28/2020 CLINICAL DATA:  Side abdominal pain for 2 days, history of colon cancer surgery in 2019, prior chemotherapy; past history coronary artery disease post MI, diabetes mellitus, hypertension, ischemic cardiomyopathy EXAM: CT ABDOMEN AND PELVIS WITH CONTRAST TECHNIQUE: Multidetector CT imaging of the abdomen and pelvis was  performed using the standard protocol following bolus administration of intravenous contrast. Sagittal and coronal MPR images reconstructed from axial data set. CONTRAST:  1045mOMNIPAQUE IOHEXOL 300 MG/ML SOLN IV. Dilute oral contrast. COMPARISON:  03/21/2007 FINDINGS: Lower chest: Bibasilar pulmonary nodules highly suspicious for metastatic disease, new, nodules up to 6 mm diameter. Minimal bibasilar atelectasis. Hepatobiliary: Gallbladder unremarkable. No biliary dilatation. Multiple ill-defined hepatic masses consistent with metastatic disease. Largest lesions measure 3.8 x 3.7 cm anterior RIGHT lobe image 21, 3.3 x 1.9 cm inferior RIGHT lobe image 30, and 2.7 x 2.1 cm superiorly LEFT lobe image 14. Pancreas: Normal appearance Spleen: Normal appearance Adrenals/Urinary Tract: Adrenal glands, kidneys, and ureters normal appearance. Mild anterior wall bladder wall thickening, question artifact related underdistention though cystitis and less likely tumor not completely excluded. Stomach/Bowel: Few uncomplicated distal colonic diverticula. Prior segmental resection LEFT:. Stomach and bowel loops otherwise normal appearance. Normal appendix. Vascular/Lymphatic: Atherosclerotic calcifications aorta including visceral artery origins, iliac arteries, femoral arteries. Aorta normal caliber. Scattered normal sized mesenteric and retroperitoneal lymph nodes. Reproductive: Unremarkable  prostate gland and seminal vesicles Other: Tumor nodules are seen throughout the mid abdomen consistent with peritoneal metastases. Largest of these is 4.0 x 2.7 cm anterior RIGHT mid abdomen image 51. Next largest lesion is in lateral LEFT upper pelvis 2.1 x 1.8 cm. No ascites. No free air. Musculoskeletal: BILATERAL pars defects L5 without listhesis. No definite osseous metastatic lesions identified. IMPRESSION: Multiple new hepatic and pulmonary metastases. Peritoneal based soft tissue nodules in the mid abdomen consistent with  metastases. Mild anterior bladder wall thickening, question artifact related underdistention though cystitis and less likely tumor not completely excluded; recommend correlation with urinalysis. Minimal distal colonic diverticulosis without evidence of diverticulitis. Aortic Atherosclerosis (ICD10-I70.0). Findings called to Dr. Gerarda Fraction on 09/28/2020 at 1421 hrs. Electronically Signed   By: Lavonia Dana M.D.   On: 09/28/2020 14:23   ECHOCARDIOGRAM COMPLETE  Result Date: 09/30/2020    ECHOCARDIOGRAM REPORT   Patient Name:   Ian [name] Date of Exam: 09/30/2020 Medical Rec #:  [id number]        Height:       64.0 in Accession #:    [id number]       Weight:       173.0 lb Date of Birth:  1956/03/13        BSA:          1.839 m Patient Age:    64 years         BP:           120/56 mmHg Patient Gender: M                HR:           81 bpm. Exam Location:  Inpatient Procedure: 2D Echo, Cardiac Doppler, Color Doppler and Intracardiac            Opacification Agent Indications:    Elevated Troponin  History:        Patient has prior history of Echocardiogram examinations, most                 recent 01/04/2020. Cardiomyopathy, CAD, Arrythmias:Atrial                 Fibrillation; Risk Factors:Hypertension, Dyslipidemia and                 Diabetes.  Sonographer:    Bernadene Person RDCS Referring Phys: 28 [place]  1. Left ventricular ejection fraction, by estimation, is 40 to 45%. The left ventricle has mildly decreased function. The left ventricle demonstrates global hypokinesis. Left ventricular diastolic parameters are consistent with Grade II diastolic dysfunction (pseudonormalization).  2. Right ventricular systolic function is normal. The right ventricular size is normal. There is normal pulmonary artery systolic pressure. The estimated right ventricular systolic pressure is 31.5 mmHg.  3. Left atrial size was moderately dilated.  4. The mitral valve is normal in structure. Mild mitral valve  regurgitation. No evidence of mitral stenosis.  5. The aortic valve is tricuspid. Aortic valve regurgitation is not visualized. Mild aortic valve stenosis. Aortic valve mean gradient measures 11.0 mmHg.  6. The inferior vena cava is dilated in size with >50% respiratory variability, suggesting right atrial pressure of 8 mmHg.  7. Technically difficult study with very poor images even with Definity. For more accurate EF determination, would consider cardiac MR. FINDINGS  Left Ventricle: Left ventricular ejection fraction, by estimation, is 40 to 45%. The left ventricle has mildly decreased function. The left ventricle demonstrates global  hypokinesis. Definity contrast agent was given IV to delineate the left ventricular  endocardial borders. The left ventricular internal cavity size was normal in size. There is no left ventricular hypertrophy. Left ventricular diastolic parameters are consistent with Grade II diastolic dysfunction (pseudonormalization). Right Ventricle: The right ventricular size is normal. No increase in right ventricular wall thickness. Right ventricular systolic function is normal. There is normal pulmonary artery systolic pressure. The tricuspid regurgitant velocity is 2.59 m/s, and  with an assumed right atrial pressure of 8 mmHg, the estimated right ventricular systolic pressure is 27.6 mmHg. Left Atrium: Left atrial size was moderately dilated. Right Atrium: Right atrial size was normal in size. Pericardium: There is no evidence of pericardial effusion. Mitral Valve: The mitral valve is normal in structure. There is mild calcification of the mitral valve leaflet(s). Mild mitral annular calcification. Mild mitral valve regurgitation. No evidence of mitral valve stenosis. Tricuspid Valve: The tricuspid valve is normal in structure. Tricuspid valve regurgitation is trivial. Aortic Valve: The aortic valve is tricuspid. Aortic valve regurgitation is not visualized. Mild aortic stenosis is present.  Aortic valve mean gradient measures 11.0 mmHg. Aortic valve peak gradient measures 18.1 mmHg. Aortic valve area, by VTI measures 2.46 cm. Pulmonic Valve: The pulmonic valve was normal in structure. Pulmonic valve regurgitation is not visualized. Aorta: The aortic root is normal in size and structure. Venous: The inferior vena cava is dilated in size with greater than 50% respiratory variability, suggesting right atrial pressure of 8 mmHg. IAS/Shunts: No atrial level shunt detected by color flow Doppler.  LEFT VENTRICLE PLAX 2D LVIDd:         4.80 cm     Diastology LVIDs:         3.40 cm     LV e' medial:    5.78 cm/s LV PW:         1.10 cm     LV E/e' medial:  21.8 LV IVS:        1.10 cm     LV e' lateral:   7.53 cm/s LVOT diam:     2.20 cm     LV E/e' lateral: 16.7 LV SV:         111 LV SV Index:   60 LVOT Area:     3.80 cm  LV Volumes (MOD) LV vol d, MOD A2C: 80.2 ml LV vol d, MOD A4C: 93.2 ml LV vol s, MOD A2C: 40.0 ml LV vol s, MOD A4C: 40.2 ml LV SV MOD A2C:     40.2 ml LV SV MOD A4C:     93.2 ml LV SV MOD BP:      46.0 ml RIGHT VENTRICLE RV S prime:     9.39 cm/s RVOT diam:      2.60 cm TAPSE (M-mode): 1.5 cm LEFT ATRIUM             Index       RIGHT ATRIUM           Index LA diam:        4.50 cm 2.45 cm/m  RA Area:     16.90 cm LA Vol (A2C):   81.4 ml 44.25 ml/m RA Volume:   44.40 ml  24.14 ml/m LA Vol (A4C):   72.5 ml 39.41 ml/m LA Biplane Vol: 77.9 ml 42.35 ml/m  AORTIC VALVE                    PULMONIC VALVE AV Area (Vmax):  2.32 cm     RVOT Peak grad: 2 mmHg AV Area (Vmean):   2.40 cm AV Area (VTI):     2.46 cm AV Vmax:           212.50 cm/s AV Vmean:          154.000 cm/s AV VTI:            0.450 m AV Peak Grad:      18.1 mmHg AV Mean Grad:      11.0 mmHg LVOT Vmax:         129.67 cm/s LVOT Vmean:        97.400 cm/s LVOT VTI:          0.292 m LVOT/AV VTI ratio: 0.65  AORTA Ao Root diam: 3.40 cm Ao Asc diam:  2.90 cm MITRAL VALVE                TRICUSPID VALVE MV Area (PHT): 4.80 cm     TR  Peak grad:   26.8 mmHg MV Decel Time: 158 msec     TR Vmax:        259.00 cm/s MV E velocity: 126.00 cm/s MV A velocity: 117.00 cm/s  SHUNTS MV E/A ratio:  1.08         Systemic VTI:  0.29 m                             Systemic Diam: 2.20 cm                             Pulmonic VTI:  0.188 m                             Pulmonic Diam: 2.60 cm                             Qp/Qs:         0.90 Loralie Champagne MD Electronically signed by Loralie Champagne MD Signature Date/Time: 09/30/2020/3:59:48 PM    Final      ASSESSMENT:  1.  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  Pathology was consistent with invasive moderately differentiated adenocarcinoma arising in a tubular adenoma with tumor invading the submucosa with no lymphovascular invasion.  Cauterized resection margin is positive for tumor. -Preoperative CEA on 09/12/2018 was 46.5. - Sigmoid colon segmental resection on 09/17/2018 showing no residual carcinoma in the sigmoid colon, margins uninvolved, 3 tumor deposits present and 1/12 lymph nodes positive.  Liver biopsy was consistent with metastatic carcinoma (margins not reported). - He never had any history of bleeding per rectum or melena.  No family history of colorectal cancer.  Brother died of lymphoma. - MRI of the liver on 10/21/2018 shows complex but nonenhancing lesion at the prior wedge biopsy site measuring 3.1 x 2.3 cm, with a complex cystic cavity with fluid fluid level. - PET CT scan on 10/30/2018 did not show any distant metastatic disease other than in the liver.   - 10 cycles of FOLFIRI and Avastin from 11/03/2018 through 03/10/2019.  -Last CEA on 12/15/2018 was 2.  -  CT of the chest on 01/23/2019 showed 2-4 mm pulmonary nodule in the right lung, stable from prior study.  No other  abnormalities were seen related to malignancy. -MRI of the liver on 01/20/2019 shows decrease in size of the subcapsular liver metastasis, measuring 2 x 1.9 cm, previously 2.9 x 2.8 cm.  No new areas were seen. -MRI of the liver on 04/11/2027 [facility] shows decreasing subcapsular segment 4 liver lesion consistent with resolving hematoma.  Dr. Crisoforo Oxford has decided to wait and watch this lesion instead of surgical resection. -He was lost to follow-up after last visit. -CTAP on 09/28/2020 showed multiple new liver metastasis and smaller lung meta stasis.  Peritoneal based soft tissue nodules in the midabdomen. -CT chest on 10/06/2020 with widespread metastatic disease to the lungs.  2.  Peripheral neuropathy: -He has on and off grade 1 neuropathy in the feet from longstanding diabetes.  3.  Hypertension: -Continue Altace, HCTZ and Norvasc.   PLAN:  1.  Metastatic sigmoid colon cancer to the liver: -He was lost to follow-up since his last visit in 2020. -In the interim he had hospitalizations with cardiac issues.  He also lost 15 pounds in the last 3 months due to decreased appetite. -He apparently complained of left mid quadrant pain and a CT scan was ordered by Dr. Gerarda Fraction. -We have reviewed images of the CT scan of the abdomen and pelvis and chest. -We talked about prognosis of metastatic colon cancer and its treatment in the palliative setting. -He has tolerated FOLFIRI and bevacizumab in the past very well. -We will start him back on same therapy in the next couple of weeks. -We will check his labs including CEA, ferritin, iron panel, CBC and CMP today.     Orders placed this encounter:  Orders Placed This Encounter  Procedures  . CBC with Differential/Platelet  . Comprehensive metabolic panel  . CEA  . Ferritin  . Iron and TIBC     Derek Jack, MD [place] [phone]   I, Milinda Antis, am acting as a scribe for Dr. Sanda Linger.  I, Derek Jack MD, have reviewed the above documentation for accuracy and completeness, and I agree with the above.

## 2020-10-17 ENCOUNTER — Other Ambulatory Visit (HOSPITAL_COMMUNITY): Payer: Federal, State, Local not specified - PPO

## 2020-10-18 ENCOUNTER — Encounter (HOSPITAL_COMMUNITY): Payer: Self-pay | Admitting: Emergency Medicine

## 2020-10-18 ENCOUNTER — Emergency Department (HOSPITAL_COMMUNITY): Payer: Federal, State, Local not specified - PPO

## 2020-10-18 ENCOUNTER — Telehealth: Payer: Self-pay | Admitting: Cardiovascular Disease

## 2020-10-18 ENCOUNTER — Other Ambulatory Visit (HOSPITAL_COMMUNITY): Payer: Self-pay | Admitting: Emergency Medicine

## 2020-10-18 ENCOUNTER — Other Ambulatory Visit: Payer: Self-pay

## 2020-10-18 ENCOUNTER — Emergency Department (HOSPITAL_COMMUNITY)
Admission: EM | Admit: 2020-10-18 | Discharge: 2020-10-18 | Disposition: A | Discharge status: Home / Self Care | Payer: Federal, State, Local not specified - PPO | Source: Home / Self Care | Attending: Emergency Medicine | Admitting: Emergency Medicine

## 2020-10-18 DIAGNOSIS — Z85038 Personal history of other malignant neoplasm of large intestine: Secondary | ICD-10-CM | POA: Insufficient documentation

## 2020-10-18 DIAGNOSIS — Z951 Presence of aortocoronary bypass graft: Secondary | ICD-10-CM | POA: Insufficient documentation

## 2020-10-18 DIAGNOSIS — J90 Pleural effusion, not elsewhere classified: Secondary | ICD-10-CM | POA: Diagnosis not present

## 2020-10-18 DIAGNOSIS — E119 Type 2 diabetes mellitus without complications: Secondary | ICD-10-CM | POA: Insufficient documentation

## 2020-10-18 DIAGNOSIS — R0600 Dyspnea, unspecified: Secondary | ICD-10-CM | POA: Diagnosis not present

## 2020-10-18 DIAGNOSIS — I2510 Atherosclerotic heart disease of native coronary artery without angina pectoris: Secondary | ICD-10-CM | POA: Insufficient documentation

## 2020-10-18 DIAGNOSIS — Z79899 Other long term (current) drug therapy: Secondary | ICD-10-CM | POA: Insufficient documentation

## 2020-10-18 DIAGNOSIS — J189 Pneumonia, unspecified organism: Secondary | ICD-10-CM | POA: Diagnosis not present

## 2020-10-18 DIAGNOSIS — I4891 Unspecified atrial fibrillation: Secondary | ICD-10-CM | POA: Insufficient documentation

## 2020-10-18 DIAGNOSIS — I249 Acute ischemic heart disease, unspecified: Secondary | ICD-10-CM | POA: Diagnosis not present

## 2020-10-18 DIAGNOSIS — Z7901 Long term (current) use of anticoagulants: Secondary | ICD-10-CM | POA: Insufficient documentation

## 2020-10-18 DIAGNOSIS — Z7982 Long term (current) use of aspirin: Secondary | ICD-10-CM | POA: Insufficient documentation

## 2020-10-18 DIAGNOSIS — Z7984 Long term (current) use of oral hypoglycemic drugs: Secondary | ICD-10-CM | POA: Insufficient documentation

## 2020-10-18 DIAGNOSIS — Z794 Long term (current) use of insulin: Secondary | ICD-10-CM | POA: Insufficient documentation

## 2020-10-18 DIAGNOSIS — I110 Hypertensive heart disease with heart failure: Secondary | ICD-10-CM | POA: Diagnosis not present

## 2020-10-18 DIAGNOSIS — R079 Chest pain, unspecified: Secondary | ICD-10-CM

## 2020-10-18 DIAGNOSIS — I10 Essential (primary) hypertension: Secondary | ICD-10-CM | POA: Insufficient documentation

## 2020-10-18 DIAGNOSIS — E1110 Type 2 diabetes mellitus with ketoacidosis without coma: Secondary | ICD-10-CM | POA: Diagnosis not present

## 2020-10-18 DIAGNOSIS — I214 Non-ST elevation (NSTEMI) myocardial infarction: Secondary | ICD-10-CM | POA: Diagnosis not present

## 2020-10-18 DIAGNOSIS — I200 Unstable angina: Secondary | ICD-10-CM | POA: Diagnosis not present

## 2020-10-18 DIAGNOSIS — R911 Solitary pulmonary nodule: Secondary | ICD-10-CM | POA: Diagnosis not present

## 2020-10-18 DIAGNOSIS — I509 Heart failure, unspecified: Secondary | ICD-10-CM | POA: Diagnosis not present

## 2020-10-18 DIAGNOSIS — I25110 Atherosclerotic heart disease of native coronary artery with unstable angina pectoris: Secondary | ICD-10-CM | POA: Diagnosis not present

## 2020-10-18 DIAGNOSIS — I21A1 Myocardial infarction type 2: Secondary | ICD-10-CM | POA: Diagnosis not present

## 2020-10-18 LAB — TROPONIN I (HIGH SENSITIVITY)
Troponin I (High Sensitivity): 27 ng/L — ABNORMAL HIGH (ref <18)
Troponin I (High Sensitivity): 44 ng/L — ABNORMAL HIGH (ref <18)

## 2020-10-18 LAB — CBC
HCT: 32.8 % — ABNORMAL LOW (ref 39.0–52.0)
Hemoglobin: 10.4 g/dL — ABNORMAL LOW (ref 13.0–17.0)
MCH: 25.6 pg — ABNORMAL LOW (ref 26.0–34.0)
MCHC: 31.7 g/dL (ref 30.0–36.0)
MCV: 80.6 fL (ref 80.0–100.0)
Platelets: 338 10*3/uL (ref 150–400)
RBC: 4.07 MIL/uL — ABNORMAL LOW (ref 4.22–5.81)
RDW: 14.9 % (ref 11.5–15.5)
WBC: 8.7 10*3/uL (ref 4.0–10.5)
nRBC: 0 % (ref 0.0–0.2)

## 2020-10-18 LAB — BASIC METABOLIC PANEL
Anion gap: 9 (ref 5–15)
BUN: 22 mg/dL (ref 8–23)
CO2: 26 mmol/L (ref 22–32)
Calcium: 9.2 mg/dL (ref 8.9–10.3)
Chloride: 97 mmol/L — ABNORMAL LOW (ref 98–111)
Creatinine, Ser: 1.34 mg/dL — ABNORMAL HIGH (ref 0.61–1.24)
GFR, Estimated: 60 mL/min — ABNORMAL LOW (ref >60)
Glucose, Bld: 325 mg/dL — ABNORMAL HIGH (ref 70–99)
Potassium: 3.6 mmol/L (ref 3.5–5.1)
Sodium: 132 mmol/L — ABNORMAL LOW (ref 135–145)

## 2020-10-18 MED ORDER — NITROGLYCERIN 0.4 MG SL SUBL: 0.4000 mg | SUBLINGUAL_TABLET | SUBLINGUAL | 0 refills | Status: DC | PRN

## 2020-10-18 MED FILL — NITROGLYCERIN 0.4 MG TAB SL: 0.4 | 5 days supply | Qty: 25 | Fill #0

## 2020-10-18 NOTE — Telephone Encounter (Signed)
Pt c/o of Chest Pain: STAT if CP now or developed within 24 hours  1. Are you having CP right now? Not currently    2. Are you experiencing any other symptoms (ex. SOB, nausea, vomiting, sweating)?  Not currently, does get pain in his back and down his left arm   3. How long have you been experiencing CP?  Was in the hospital with same issue the second weekend in November - went to the hospital this morning and they sent him home and told him to reach out to his cardiologist   4. Is your CP continuous or coming and going?  Coming and going   5. Have you taken Nitroglycerin?  Has went through 25 pills in two days   Per patient's wife patient had a workup done at the hospital this morning, EKG, blood work, and a chest xray and was told that all of it looked good  ?

## 2020-10-18 NOTE — ED Triage Notes (Signed)
Pt c/o pain in left back radiating into left chest and left arm  Pt  sts pain started yesterday Sts he has taken 10 NTG SL since yesterday Pt denies nausea or vomiting or any other symptoms

## 2020-10-18 NOTE — Progress Notes (Signed)
Cardiology Office Note   Date:  10/19/2020   ID:  Rastus, Borton 16-Mar-1956, MRN [id number]  PCP:  Sharilyn Sites, MD  Cardiologist:  Dr. Burt Knack, MD    Chief Complaint  Patient presents with   Chest Pain    History of Present Illness: Ian [name] is a 64 y.o. male who presents for ED follow-up, seen for Dr. Burt Knack.  Ian [name] has a hx of CAD s/p CABG 2008, STEMI 08/2019 s/p DES x2 to L radial-PDA with DES x 2 to protected LM into LCx, repeat STEMI 12/2019 s/p DES to L radial-PDA, DM, GERD, HTN, HLD, ICM, PAD s/p R fempop BPG 02/2020, PAF (noted after FPBPG), and colon CA with recent dx of new hepatic and pulmonary metastasis (prior mets to liver s/p partial colectomy andchemotherapy).   In follow up with Richardson Dopp, PA-C 07/2020 he was having evening angina, especially if his day had been overly busy and he was active. His Imdur was up titrated at that time.   More recently, he developed left lower quadrant pain and was seen by his PCP at which time an outpatient CT was performed 09/28/2020 which showed multiple new hepatic and pulmonary metastatic areas with mild anterior bladder wall thickening.  He was referred to oncology with plan to start chemo next week.   Unfortunately he was then seen in cardiology hospital consultation for persistent chest pain on 09/30/2020. Initially his pain began as a stuttering sensation lasting a few minutes at a time. Over the course of several hours he required 7 sublingual nitroglycerin tablets. On ED arrival HST was elevated at 268 with a repeat at 241. EKG showed inferior ST depression as well as ST depression in leads V5-V6 similar to tracing from 07/2020. He was started on heparin infusion for NSTEMI. He was treated with conservative management with increasing nitrates and adding Ranexa.   Since hospital discharge, he continued to have daily chest pain which prompted another ED visit on 10/18/20. EKG appeared to be unchanged from  prior tracing. HST troponin at 27>>44. Creatinine elevated from 2 weeks prior from 0.96 to 1.34. CXR without acute cardiopulmonary disease. Plan discussed between ED MD and overnight cardiology fellow who recommended discharge home with outpatient cardiology follow-up. At that time he was using almost an entire bottle of sublingual nitroglycerin in a 5 day period. Pain typically would occur with exertion however is was also occurring at rest. Symptoms described as a anterior left-sided chest pressure with radiation to his left arm and neck.    Today he presents for follow up and states that he is miserable as his chest pain is occurring multiple times per day, at rest and with exertion. He is unable to perform any task without angina. He states that last night he took approximately 10 SL NTG tablets to help relieve his symptoms. He states symptoms are similar to prior angina with his STEMI's which begins as left sided back pain with radiation to his chest and left arm. Before the pain gets "too bad" he will start taking NTG which helps however only for approximately 30 minutes or so. He states he is beginning to have increased SOB as well. As above he was recently dx with metastatic cancer with lung and liver involvement. He is to start chemotherapy next week however he wishes to take care of his current cardiac issues first given the degree of his symptoms. He feels he has very poor quality of life at this  point. Discussed case with Dr. Burt Knack who feels that re-cath is appropriate and our only option. Pt wishes to proceed. We will load him with Plavix 300 today and he is to hold Eliquis PM dosing tonight and tomorrow morning. He is currently chest pain free. ED precautions reviewed with patient.   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   Colon cancer ([place])    Complication of anesthesia    atypical pseudo-cholinesterase deficiency   Coronary artery disease  01/10/2013   s/p CABG 2008 // s/p STEMI in 08/2019>>DES x 2 to L radial-PDA; staged PCI: DES x 2 to protected LM into LCx // s/p inf-lat STEMI 12/2019 >> DES to L Radial-PDA   Diabetes mellitus    Family Hx of adverse reaction to anesthesia    sister also has atypical pseudo-cholinesterase deficiency   Gastroesophageal reflux disease    History of heart bypass surgery    HLD (hyperlipidemia)    Hypertension    Ischemic cardiomyopathy    Echo 01/04/2020: EF 45-50, mild LVH, Gr 2 DD, normal RVSF, mild to mod LAE, mild MR, mild AS (mean gradient 6 mmHg)   Mild aortic stenosis    Mild mitral regurgitation    Myocardial infarction ([place])    PAD (peripheral artery disease) ([place])    s/p R fem-pop bypass 02/2020 (Dr. Donnetta Hutching)   Paroxysmal atrial fibrillation ([place])    CHADS-VASc=3 // noted post op after fem pop bypass in 02/2020; due to high risk, Apixaban started    Past Surgical History:  Procedure Laterality Date   ABDOMINAL AORTOGRAM W/LOWER EXTREMITY Bilateral 02/18/2020   Procedure: ABDOMINAL AORTOGRAM W/LOWER EXTREMITY;  Surgeon: Marty Heck, MD;  Location: [facility];  Service: Cardiovascular;  Laterality: Bilateral;   COLONOSCOPY N/A 08/29/2018   Procedure: COLONOSCOPY;  Surgeon: Daneil Dolin, MD;  [facility];  Service: Endoscopy;  Laterality: N/A;  9:30   CORONARY ARTERY BYPASS GRAFT     4 vessels   CORONARY ATHERECTOMY N/A 09/15/2019   Procedure: CORONARY ATHERECTOMY;  Surgeon: Sherren Mocha, MD;  Location: [facility];  Service: Cardiovascular;  LM-LCx Atherectomy - DES PCI (*Protected LM)    CORONARY STENT INTERVENTION N/A 09/15/2019   Procedure: CORONARY STENT INTERVENTION;  Surgeon: Sherren Mocha, MD;  Location: [facility];; Successful orbital atherectomy, PTCA, and overlapping DES treatment of severe calcific stenosis in the left main and left circumflex using a 2.5x34 mm Resolute Onyx DES (left circumflex) and 3.0x30  mm Resolute Onyx DES (overlapped proximally in the LCx extending into left main)   CORONARY STENT INTERVENTION N/A 01/04/2020   Procedure: CORONARY STENT INTERVENTION;  Surgeon: Sherren Mocha, MD;  Location: [facility];  Service: Cardiovascular;  Laterality: N/A;   CORONARY/GRAFT ACUTE MI REVASCULARIZATION N/A 09/14/2019   Procedure: Coronary/Graft Acute MI Revascularization;  Surgeon: Sherren Mocha, MD;  Location: [facility];  Service: Cardiovascular;  Laterality: N/A;   FEMORAL-POPLITEAL BYPASS GRAFT Right 02/25/2020   Procedure: FEMORAL-ABOVE KNEE POPLITEAL ARTERY BYPASS GRAFT with Saphenous vein;  Surgeon: Rosetta Posner, MD;  Location: [facility];  Service: Vascular;  Laterality: Right;   heart bypass     INSERTION OF MESH  02/07/2015   Procedure: INSERTION OF MESH;  Surgeon: Aviva Signs Md, MD;  Location: [facility];  Service: General;;   INTRAVASCULAR ULTRASOUND/IVUS N/A 01/04/2020   Procedure: Intravascular Ultrasound/IVUS;  Surgeon: Sherren Mocha, MD;  Location: [facility];  Service: Cardiovascular;  Laterality: N/A;   LEFT HEART CATH AND CORONARY ANGIOGRAPHY N/A 09/14/2019   Procedure: LEFT HEART CATH AND CORONARY ANGIOGRAPHY;  Surgeon: Sherren Mocha, MD;  Location: [facility];  Service: Cardiovascular;  Laterality: N/A;   LEFT HEART CATH AND CORONARY ANGIOGRAPHY N/A 01/04/2020   Procedure: LEFT HEART CATH AND CORONARY ANGIOGRAPHY;  Surgeon: Sherren Mocha, MD;  Location: [facility];  Service: Cardiovascular;  Laterality: N/A;   PARTIAL COLECTOMY N/A 09/17/2018   Procedure: PARTIAL COLECTOMY WITH PARTIAL WEDGE RESECTION LIVER METASTASIS;  Surgeon: Aviva Signs, MD;  Location: [facility];  Service: General;  Laterality: N/A;   POLYPECTOMY  08/29/2018   Procedure: POLYPECTOMY;  Surgeon: Daneil Dolin, MD;  [facility];  Service: Endoscopy;;   PORTACATH PLACEMENT Left 10/10/2018   Procedure: INSERTION PORT-A-CATH (catheter in left  subclavian);  Surgeon: Aviva Signs, MD;  Location: [facility];  Service: General;  Laterality: Left;   UMBILICAL HERNIA REPAIR N/A 02/07/2015   Procedure: UMBILICAL HERNIORRHAPHY WITH MESH;  Surgeon: Aviva Signs Md, MD;  Location: [facility];  Service: General;  Laterality: N/A;     Current Outpatient Medications  Medication Sig Dispense Refill   apixaban (ELIQUIS) 5 MG TABS tablet Take 1 tablet (5 mg total) by mouth 2 (two) times daily. 60 tablet 5   clopidogrel (PLAVIX) 75 MG tablet Take 75 mg by mouth daily.     Coenzyme Q10 (COQ10) 100 MG CAPS Take 100 mg by mouth daily.     diltiazem (CARDIZEM CD) 360 MG 24 hr capsule Take 1 capsule (360 mg total) by mouth daily. 90 capsule 3   diphenhydramine-acetaminophen (TYLENOL PM) 25-500 MG TABS tablet Take 2 tablets by mouth at bedtime.     Evolocumab (REPATHA SURECLICK) 147 MG/ML SOAJ Inject 140 mg into the skin every 14 (fourteen) days. 2 mL 11   ezetimibe-simvastatin (VYTORIN) 10-80 MG tablet Take 1 tablet by mouth daily.      FARXIGA 10 MG TABS tablet Take 10 mg by mouth daily.      hydrochlorothiazide (HYDRODIURIL) 25 MG tablet Take 1 tablet (25 mg total) by mouth daily. 90 tablet 3   icosapent Ethyl (VASCEPA) 1 g capsule Take 2 capsules (2 g total) by mouth 2 (two) times daily. 360 capsule 3   insulin aspart protamine- aspart (NOVOLOG MIX 70/30) (70-30) 100 UNIT/ML injection Inject 20 Units into the skin in the morning, at noon, and at bedtime.      Insulin Glargine (BASAGLAR KWIKPEN) 100 UNIT/ML SOPN Inject 40 Units into the skin at bedtime.      isosorbide mononitrate (IMDUR) 30 MG 24 hr tablet Take 1 tablet (30 mg total) by mouth every evening. 90 tablet 0   isosorbide mononitrate (IMDUR) 60 MG 24 hr tablet Take 1 tablet (60 mg total) by mouth daily. 90 tablet 3   JANUVIA 100 MG tablet Take 100 mg by mouth daily.      metFORMIN (GLUCOPHAGE) 500 MG tablet Take 500 mg by mouth 2 (two) times daily.     metoprolol succinate  (TOPROL-XL) 100 MG 24 hr tablet Take 1 tablet (100 mg total) by mouth 2 (two) times daily. Take with or immediately following a meal. 60 tablet 1   nitroGLYCERIN (NITROSTAT) 0.4 MG SL tablet Place 1 tablet (0.4 mg total) under the tongue every 5 (five) minutes as needed for chest pain. 15 tablet 0   pantoprazole (PROTONIX) 40 MG tablet Take 40 mg by mouth daily.     ramipril (ALTACE)  10 MG capsule Take 10 mg by mouth daily.     ranolazine (RANEXA) 500 MG 12 hr tablet Take 1 tablet (500 mg total) by mouth 2 (two) times daily. 180 tablet 0   rosuvastatin (CRESTOR) 40 MG tablet Take 1 tablet (40 mg total) by mouth at bedtime. 90 tablet 0   clopidogrel (PLAVIX) 300 MG TABS tablet Take 1 tablet (300 mg total) by mouth as directed. TAKE THIS PM 1 tablet 0   No current facility-administered medications for this visit.    Allergies:   Anectine [succinylcholine] and Nsaids    Social History:  The patient  reports that he has never smoked. He has never used smokeless tobacco. He reports that he does not drink alcohol and does not use drugs.   Family History:  The patient's family history includes Diabetes in his brother, brother, father, sister, and sister; Heart disease in his brother and father; Hypertension in his brother, father, sister, and son; Lymphoma in his brother.    ROS:  Please see the history of present illness.   Otherwise, review of systems are positive for none. All other systems are reviewed and negative.    PHYSICAL EXAM: VS:  BP 120/60    Pulse 100    Ht 5\' 4"  (1.626 m)    Wt 181 lb 9.6 oz (82.4 kg)    SpO2 95%    BMI 31.17 kg/m  , BMI Body mass index is 31.17 kg/m.   General: Well developed, well nourished, NAD Neck: Negative for carotid bruits. No JVD Lungs:Clear to ausculation bilaterally. No wheezes, rales, or rhonchi. Breathing is unlabored. Cardiovascular: RRR with S1 S2. No murmurs Extremities: No edema. Radial pulses 2+ bilaterally Neuro: Alert and oriented. No  focal deficits. No facial asymmetry. MAE spontaneously. Psych: Responds to questions appropriately with normal affect.    EKG:  EKG is not ordered today.  Recent Labs: 01/06/2020: Magnesium 2.1 07/15/2020: ALT 19 09/30/2020: TSH <0.010 10/18/2020: BUN 22; Creatinine, Ser 1.34; Hemoglobin 10.4; Platelets 338; Potassium 3.6; Sodium 132   Lipid Panel    Component Value Date/Time   CHOL 152 07/15/2020 0826   TRIG 279 (H) 07/15/2020 0826   HDL 25 (L) 07/15/2020 0826   CHOLHDL 6.1 (H) 07/15/2020 0826   CHOLHDL 6.8 02/23/2020 0320   VLDL 64 (H) 02/23/2020 0320   LDLCALC 81 07/15/2020 0826   LDLDIRECT 141.4 (H) 09/14/2019 0243      Wt Readings from Last 3 Encounters:  10/19/20 181 lb 9.6 oz (82.4 kg)  10/18/20 177 lb (80.3 kg)  10/11/20 177 lb 8 oz (80.5 kg)    Other studies Reviewed: Additional studies/ records that were reviewed today include: Review of the above records demonstrates:   Echo 09/30/2020:  1. Left ventricular ejection fraction, by estimation, is 40 to 45%. The  left ventricle has mildly decreased function. The left ventricle  demonstrates global hypokinesis. Left ventricular diastolic parameters are  consistent with Grade II diastolic  dysfunction (pseudonormalization).  2. Right ventricular systolic function is normal. The right ventricular  size is normal. There is normal pulmonary artery systolic pressure. The  estimated right ventricular systolic pressure is 33.8 mmHg.  3. Left atrial size was moderately dilated.  4. The mitral valve is normal in structure. Mild mitral valve  regurgitation. No evidence of mitral stenosis.  5. The aortic valve is tricuspid. Aortic valve regurgitation is not  visualized. Mild aortic valve stenosis. Aortic valve mean gradient  measures 11.0 mmHg.  6. The inferior vena  cava is dilated in size with >50% respiratory  variability, suggesting right atrial pressure of 8 mmHg.  7. Technically difficult study with very poor  images even with Definity.  For more accurate EF determination, would consider cardiac MR.   Cardiac Cath 01/04/2020 Conclusion  1. Severe multivessel coronary artery disease with chronic total occlusion of the LAD and RCA, continued patency of the left mainstem and left circumflex stents with focal mild to moderate in-stent restenosis at the transition of the left main into the circumflex. 2. Status post aortocoronary bypass surgery with continued patency of the LIMA to LAD graft and severe aorto ostial stenosis of the radial graft to PDA treated successfully with PCI using a 3.0 x 30 mm resolute Onyx DES with intravascular ultrasound guidance 3. Nonobstructive ostial left circumflex restenosis as demonstrated by intravascular ultrasound with a minimal lumen area of 5.96 mm  Recommendations: Long-term dual antiplatelet therapy with aspirin and Effient, continued aggressive medical therapy, 2D echocardiogram for assessment of LV systolic function.  Diagnostic Dominance: Right  Intervention      ASSESSMENT AND PLAN:  1. Complex CAD with progressive angina: -At baseline he has chronic angina however reports symptoms have worsened since recent hospitalization to the point that he takes at least 10 SL NTG tablets to help control his symptoms>>>ED visit on 11/30/21with  EKG that appeared to be unchanged from prior tracing. HST troponin at 27>>44. Symptoms described as a anterior left-sided chest pressure with radiation to his left arm and neck similar to prior MI -Case discussed with Dr. Burt Knack who feel our only option will be repeat cardiac cath. He is currently chest pain free -Cardiac catheterization was discussed with the patient fully. The patient understands that risks include but are not limited to stroke (1 in 1000), death (1 in 54), kidney failure [usually temporary] (1 in 500), bleeding (1 in 200), allergic reaction [possibly serious] (1 in 200).  The patient understands and is  willing to proceed.   -Obtain BMET, CBC -Load with 300mg  Plavix today and 75mg  tomorrow. -Hold Eliquis this evening and tomorrow  2. Recent dx of hepatic and pulmonary metastasis with hx of colon cancer: -Follows with oncology with plans to start chemo next week   3. Ischemic cardiomyopathy: -Last echo 12/2019 with EF at 45-50% with global hypokinesis, LVH, G2DD, mild MR -Appears euvolemic on exam today  -Continue ACEI  5. Mild AS/MR: -Stable per last echo 12/2019   6. Paroxysmal atrial fibrillation (in NSR): -Maintaining NSR -Hold Eliquis this evening and again tomorrow morning in anticipation of cath 10/20/20  7. HTN: -Stable, 120/60 -No change    Current medicines are reviewed at length with the patient today.  The patient does not have concerns regarding medicines.  The following changes have been made: Plavix load with 300mg , Plavix 75 QD, hold Eliquis today and tomorrow for cath   Labs/ tests ordered today include: BMET, CBC  Orders Placed This Encounter  Procedures   Basic metabolic panel   CBC   Disposition:   FU with Dr. Burt Knack in 3 weeks  Signed, Kathyrn Drown, NP  10/19/2020 11:37 AM    [address], [place], [place]  [address] Phone: [phone]; Fax: [phone] \

## 2020-10-18 NOTE — ED Notes (Signed)
ED Provider at bedside. 

## 2020-10-18 NOTE — ED Provider Notes (Signed)
[facility] EMERGENCY DEPARTMENT Provider Note   CSN: [id number] Arrival date & time: 10/18/20  0154     History Chief Complaint  Patient presents with  . Chest Pain    Ian [name] is a 64 y.o. male.  The history is provided by the patient and medical records.  Chest Pain   64 year old male with history of coronary artery disease status post CABG and stenting, diabetes, acid reflux, hypertension, ischemic cardiomyopathy, peripheral arterial disease, paroxysmal A. fib on Eliquis, recent diagnosis of metastatic colon cancer not yet started on chemotherapy, presenting to the ED with chest pain.  He was admitted earlier in the month for questionable demand ischemia versus NSTEMI as he did have some medication noncompliance due to other health concerns.  During hospitalization he was treated conservatively, started on ranolazine and increased Imdur dose.  States since leaving the hospital he has continued to have chest pain on a daily basis, often multiple times a day.  He has used an entire bottle of nitroglycerin over the past 5 days.  Pain occurs with exertion and at rest but states it feels the same each time--left-sided chest pressure with radiation into the arm and neck.  Denies any shortness of breath, diaphoresis, nausea, or vomiting.  States if pain continues for quite a while he will start to get lightheaded but he usually takes his nitro right away.  Currently he is pain-free.  LHC 01/04/20 w/findings: 1.  Severe multivessel coronary artery disease with chronic total occlusion of the LAD and RCA, continued patency of the left mainstem and left circumflex stents with focal mild to moderate in-stent restenosis at the transition of the left main into the circumflex. 2.  Status post aortocoronary bypass surgery with continued patency of the LIMA to LAD graft and severe aorto ostial stenosis of the radial graft to PDA treated successfully with PCI using a 3.0 x 30 mm  resolute Onyx DES with intravascular ultrasound guidance 3.  Nonobstructive ostial left circumflex restenosis as demonstrated by intravascular ultrasound with a minimal lumen area of 5.96 mm  Recommendations: Long-term dual antiplatelet therapy with aspirin and Effient, continued aggressive medical therapy, 2D echocardiogram for assessment of LV systolic function.   Past Medical History:  Diagnosis Date  . 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 Colon cancer ([place])   . Complication of anesthesia    atypical pseudo-cholinesterase deficiency  . Coronary artery disease 01/10/2013   s/p CABG 2008 // s/p STEMI in 08/2019>>DES x 2 to L radial-PDA; staged PCI: DES x 2 to protected LM into LCx // s/p inf-lat STEMI 12/2019 >> DES to L Radial-PDA  . Diabetes mellitus   . Family Hx of adverse reaction to anesthesia    sister also has atypical pseudo-cholinesterase deficiency  . Gastroesophageal reflux disease   . History of heart bypass surgery   . HLD (hyperlipidemia)   . Hypertension   . Ischemic cardiomyopathy    Echo 01/04/2020: EF 45-50, mild LVH, Gr 2 DD, normal RVSF, mild to mod LAE, mild MR, mild AS (mean gradient 6 mmHg)  . Mild aortic stenosis   . Mild mitral regurgitation   . Myocardial infarction ([place])   . PAD (peripheral artery disease) (H[facility])    s/p R fem-pop bypass 02/2020 (Dr. Donnetta Hutching)  . Paroxysmal atrial fibrillation ([facility])    CHADS-VASc=3 // noted post op after fem pop bypass in 02/2020; due to high risk, Apixaban started    Patient Active Problem List   Diagnosis Date Noted  .  Goals of care, counseling/discussion 10/11/2020  . Mixed hyperlipidemia 07/26/2020  . PAD (peripheral artery disease) ([place]) 02/25/2020  . Chest pain 02/22/2020  . ST elevation myocardial infarction (STEMI) of inferolateral wall ([place])   . Unstable angina ([place]) 01/04/2020  . Acute coronary syndrome ([place])   . Hx of CABG   . Presence of drug-eluting stent in RCA, LM-LCx 09/15/2019  . Non-ST elevation  (NSTEMI) myocardial infarction ([place]) 09/14/2019  . NSTEMI (non-ST elevated myocardial infarction) ([place]) 09/14/2019  . S/P partial colectomy 09/17/2018  . Malignant neoplasm of sigmoid colon ([place])   . Liver mass, right lobe   . Dysfunctional ventilatory weaning response ([place]) 02/07/2015  . Palpitations, recurrent 01/10/2013  . Obesity 01/10/2013  . Essential hypertension 01/10/2013  . Coronary artery disease involving native coronary artery of native heart with unstable angina pectoris ([place]) 01/10/2013  . Hyperlipidemia associated with type 2 diabetes mellitus ([place]) 01/10/2013  . OSA (obstructive sleep apnea), probable 01/10/2013  . Diabetes mellitus type II, uncontrolled ([place]) 01/10/2013  . 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 Chest tightness 01/10/2013    Past Surgical History:  Procedure Laterality Date  . ABDOMINAL AORTOGRAM W/LOWER EXTREMITY Bilateral 02/18/2020   Procedure: ABDOMINAL AORTOGRAM W/LOWER EXTREMITY;  Surgeon: Marty Heck, MD;  Location: [facility];  Service: Cardiovascular;  Laterality: Bilateral;  . COLONOSCOPY N/A 08/29/2018   Procedure: COLONOSCOPY;  Surgeon: Daneil Dolin, MD;  Location: [facility];  Service: Endoscopy;  Laterality: N/A;  9:30  . CORONARY ARTERY BYPASS GRAFT     4 vessels  . CORONARY ATHERECTOMY N/A 09/15/2019   Procedure: CORONARY ATHERECTOMY;  Surgeon: Sherren Mocha, MD;  Location: [facility];  Service: Cardiovascular;  LM-LCx Atherectomy - DES PCI (*Protected LM)   . CORONARY STENT INTERVENTION N/A 09/15/2019   Procedure: CORONARY STENT INTERVENTION;  Surgeon: Sherren Mocha, MD;  Location: [facility];; Successful orbital atherectomy, PTCA, and overlapping DES treatment of severe calcific stenosis in the left main and left circumflex using a 2.5x34 mm Resolute Onyx DES (left circumflex) and 3.0x30 mm Resolute Onyx DES (overlapped proximally in the LCx extending into left main)  . CORONARY STENT  INTERVENTION N/A 01/04/2020   Procedure: CORONARY STENT INTERVENTION;  Surgeon: Sherren Mocha, MD;  Location: [facility];  Service: Cardiovascular;  Laterality: N/A;  . CORONARY/GRAFT ACUTE MI REVASCULARIZATION N/A 09/14/2019   Procedure: Coronary/Graft Acute MI Revascularization;  Surgeon: Sherren Mocha, MD;  Location: [facility];  Service: Cardiovascular;  Laterality: N/A;  . FEMORAL-POPLITEAL BYPASS GRAFT Right 02/25/2020   Procedure: FEMORAL-ABOVE KNEE POPLITEAL ARTERY BYPASS GRAFT with Saphenous vein;  Surgeon: Rosetta Posner, MD;  Location: [facility];  Service: Vascular;  Laterality: Right;  . heart bypass    . INSERTION OF MESH  02/07/2015   Procedure: INSERTION OF MESH;  Surgeon: Aviva Signs Md, MD;  Location: [facility];  Service: General;;  . INTRAVASCULAR ULTRASOUND/IVUS N/A 01/04/2020   Procedure: Intravascular Ultrasound/IVUS;  Surgeon: Sherren Mocha, MD;  Location: [facility];  Service: Cardiovascular;  Laterality: N/A;  . LEFT HEART CATH AND CORONARY ANGIOGRAPHY N/A 09/14/2019   Procedure: LEFT HEART CATH AND CORONARY ANGIOGRAPHY;  Surgeon: Sherren Mocha, MD;  Location: [facility];  Service: Cardiovascular;  Laterality: N/A;  . LEFT HEART CATH AND CORONARY ANGIOGRAPHY N/A 01/04/2020   Procedure: LEFT HEART CATH AND CORONARY ANGIOGRAPHY;  Surgeon: Sherren Mocha, MD;  Location: [facility];  Service: Cardiovascular;  Laterality: N/A;  .  PARTIAL COLECTOMY N/A 09/17/2018   Procedure: PARTIAL COLECTOMY WITH PARTIAL WEDGE RESECTION LIVER METASTASIS;  Surgeon: Aviva Signs, MD;  Location: [facility];  Service: General;  Laterality: N/A;  . POLYPECTOMY  08/29/2018   Procedure: POLYPECTOMY;  Surgeon: Daneil Dolin, MD;  [facility];  Service: Endoscopy;;  . PORTACATH PLACEMENT Left 10/10/2018   Procedure: INSERTION PORT-A-CATH (catheter in left subclavian);  Surgeon: Aviva Signs, MD;  Location: [facility];  Service: General;  Laterality: Left;  .  UMBILICAL HERNIA REPAIR N/A 02/07/2015   Procedure: UMBILICAL HERNIORRHAPHY WITH MESH;  Surgeon: Aviva Signs Md, MD;  Location: [facility];  Service: General;  Laterality: N/A;       Family History  Problem Relation Age of Onset  . Hypertension Father   . Diabetes Father   . Heart disease Father   . Diabetes Sister   . Hypertension Sister   . Hypertension Brother   . Diabetes Brother   . Diabetes Sister   . Diabetes Brother   . Heart disease Brother   . Lymphoma Brother   . Hypertension Son     Social History   Tobacco Use  . Smoking status: Never Smoker  . Smokeless tobacco: Never Used  Vaping Use  . Vaping Use: Never used  Substance Use Topics  . Alcohol use: No  . Drug use: No    Home Medications Prior to Admission medications   Medication Sig Start Date End Date Taking? Authorizing Provider  apixaban (ELIQUIS) 5 MG TABS tablet Take 1 tablet (5 mg total) by mouth 2 (two) times daily. 05/31/20   Sherren Mocha, MD  aspirin EC 81 MG EC tablet Take 1 tablet (81 mg total) by mouth daily. Swallow whole. 10/02/20 12/31/20  [place] Donnamarie Poag, DO  Coenzyme Q10 (COQ10) 100 MG CAPS Take 100 mg by mouth daily.    [provider]  diltiazem (CARDIZEM CD) 360 MG 24 hr capsule Take 1 capsule (360 mg total) by mouth daily. 04/01/20 03/27/21  Sherren Mocha, MD  diphenhydramine-acetaminophen (TYLENOL PM) 25-500 MG TABS tablet Take 2 tablets by mouth at bedtime.    [provider]  Evolocumab (REPATHA SURECLICK) 412 MG/ML SOAJ Inject 140 mg into the skin every 14 (fourteen) days. 08/02/20   Sherren Mocha, MD  ezetimibe-simvastatin (VYTORIN) 10-80 MG tablet Take 1 tablet by mouth daily.  09/03/20   [provider]  FARXIGA 10 MG TABS tablet Take 10 mg by mouth daily.  12/19/18   [provider]  hydrochlorothiazide (HYDRODIURIL) 25 MG tablet Take 1 tablet (25 mg total) by mouth daily. 09/24/19   Bhagat, Crista Luria, PA  icosapent Ethyl (VASCEPA) 1 g capsule  Take 2 capsules (2 g total) by mouth 2 (two) times daily. 09/27/20   [place] Dopp T, PA-C  insulin aspart protamine- aspart (NOVOLOG MIX 70/30) (70-30) 100 UNIT/ML injection Inject 20 Units into the skin in the morning, at noon, and at bedtime.     [provider]  Insulin Glargine (BASAGLAR KWIKPEN) 100 UNIT/ML SOPN Inject 40 Units into the skin at bedtime.  12/10/18   [provider]  isosorbide mononitrate (IMDUR) 30 MG 24 hr tablet Take 1 tablet (30 mg total) by mouth every evening. 10/01/20 12/30/20  [place] Donnamarie Poag, DO  isosorbide mononitrate (IMDUR) 60 MG 24 hr tablet Take 1 tablet (60 mg total) by mouth daily. 02/09/20   Kathlen Mody, Scott T, PA-C  JANUVIA 100 MG tablet Take 100 mg by mouth daily.  12/19/18   [provider]  metFORMIN (GLUCOPHAGE) 500 MG tablet Take 500 mg by mouth 2 (two) times daily. 08/27/19   [provider]  metoprolol succinate (TOPROL-XL) 100 MG 24 hr tablet Take 1 tablet (100 mg total) by mouth 2 (two) times daily. Take with or immediately following a meal. 09/16/19   Cheryln Manly, NP  nitroGLYCERIN (NITROSTAT) 0.4 MG SL tablet Place 1 tablet (0.4 mg total) under the tongue every 5 (five) minutes as needed for chest pain. 10/01/20 10/31/20  [place] Donnamarie Poag, DO  pantoprazole (PROTONIX) 40 MG tablet Take 40 mg by mouth daily.    [provider]  ramipril (ALTACE) 10 MG capsule Take 10 mg by mouth daily.    [provider]  ranolazine (RANEXA) 500 MG 12 hr tablet Take 1 tablet (500 mg total) by mouth 2 (two) times daily. 10/01/20 12/30/20  [place] Donnamarie Poag, DO  rosuvastatin (CRESTOR) 40 MG tablet Take 1 tablet (40 mg total) by mouth at bedtime. 01/06/20   Cheryln Manly, NP    Allergies    Anectine [succinylcholine] and Nsaids  Review of Systems   Review of Systems  Cardiovascular: Positive for chest pain.  All other systems reviewed and are negative.   Physical Exam Updated Vital Signs BP 126/84   Pulse 83    Temp 98.1 F (36.7 C) (Oral)   Resp 18   Ht 5\' 4"  (1.626 m)   Wt 80.3 kg   SpO2 100%   BMI 30.38 kg/m   Physical Exam Vitals and nursing note reviewed.  Constitutional:      Appearance: He is well-developed.  HENT:     Head: Normocephalic and atraumatic.  Eyes:     Conjunctiva/sclera: Conjunctivae normal.     Pupils: Pupils are equal, round, and reactive to light.  Cardiovascular:     Rate and Rhythm: Normal rate and regular rhythm.     Heart sounds: Normal heart sounds.  Pulmonary:     Effort: Pulmonary effort is normal.     Breath sounds: Normal breath sounds. No decreased breath sounds, wheezing or rhonchi.  Abdominal:     General: Bowel sounds are normal.     Palpations: Abdomen is soft.  Musculoskeletal:        General: Normal range of motion.     Cervical back: Normal range of motion.  Skin:    General: Skin is warm and dry.  Neurological:     Mental Status: He is alert and oriented to person, place, and time.     ED Results / Procedures / Treatments   Labs (all labs ordered are listed, but only abnormal results are displayed) Labs Reviewed  BASIC METABOLIC PANEL - Abnormal; Notable for the following components:      Result Value   Sodium 132 (*)    Chloride 97 (*)    Glucose, Bld 325 (*)    Creatinine, Ser 1.34 (*)    GFR, Estimated 60 (*)    All other components within normal limits  CBC - Abnormal; Notable for the following components:   RBC 4.07 (*)    Hemoglobin 10.4 (*)    HCT 32.8 (*)    MCH 25.6 (*)    All other components within normal limits  TROPONIN I (HIGH SENSITIVITY) - Abnormal; Notable for the following components:   Troponin I (High Sensitivity) 27 (*)    All other components within normal limits  TROPONIN I (HIGH SENSITIVITY)    EKG None  Radiology DG Chest  2 View  Result Date: 10/18/2020 CLINICAL DATA:  Chest pain EXAM: CHEST - 2 VIEW COMPARISON:  09/30/2020 FINDINGS: The lungs are symmetrically expanded and are clear. No  pneumothorax or pleural effusion. Coronary artery bypass grafting has been performed. Cardiac size is within normal limits. Left subclavian chest port tip is seen within the superior vena cava. Pulmonary vascularity is normal. No acute bone abnormality. IMPRESSION: No active cardiopulmonary disease. Electronically Signed   By: Fidela Salisbury MD   On: 10/18/2020 02:38    Procedures Procedures (including critical care time)  Medications Ordered in ED Medications - No data to display  ED Course  I have reviewed the triage vital signs and the nursing notes.  Pertinent labs & imaging results that were available during my care of the patient were reviewed by me and considered in my medical decision making (see chart for details).    MDM Rules/Calculators/A&P  64 year old male presenting to the ED with chest pain.  Somewhat of a chronic issue for him, hospitalized earlier this month for NSTEMI vs demand ischemia from medication non-compliance.  Also recently diagnosed relapse of metastatic colon cancer, due to start chemo in 2 days.  Pain continues occurring at random intervals, sometimes at rest, sometimes with exertion but states always the same character.  He has not had any falls or syncopal events.  EKG appears unchanged from prior.  Initials labs with trop 27, slight bump in SrCr when compared with labs from 2 weeks ago (0.96 to 1.34 today).  Some hyperglycemia without findings of DKA.  CXR clear.  Patient pain free at present.  He does have extensive cardiac history and given recent hospitalization, will discuss with cardiology for recommendations.  Discussed with cardiology, Dr. Paticia Stack-- if delta trop not precipitously increased, can discharge home to follow-up with OP cardiology.  Could consider increasing his imdur 60mg  AM 30mg  PM then can do 60mg  BID or 90mg  AM then 30mg  PM if BP allows or can let cardiology titrate.  6:44 AM Delta trop 44.  Patient remains pain free.  Given less than a  change of 20, do not feel hospitalization at this time will provide much benefit.  Does have noted AKI, this may be contributing.  We discussed increasing his Imdur as recommended, however he is hesitant to do so without talking to his primary cardiologist which is reasonable.  I have given a very small refill of his nitroglycerin until then.  Will send message to his cardiologist to help facilitate sooner follow-up.  He may return here for any new/acute changes.  Case discussed with attending physician, Dr. Dayna Barker, who agrees with plan of care.  Final Clinical Impression(s) / ED Diagnoses Final diagnoses:  Chest pain in adult    Rx / DC Orders ED Discharge Orders    None       Alvester, Eads, PA-C 10/18/20 [id number]    Merrily Pew, MD 10/19/20 [phone]

## 2020-10-18 NOTE — Telephone Encounter (Signed)
The patient states he feels fine right now and denies cardiac symptoms.  Scheduled the patient for evaluation tomorrow morning with Kathyrn Drown for follow-up post ER visit. He was grateful for call and agrees with plan.

## 2020-10-18 NOTE — ED Notes (Signed)
Lab contacted for update on repeat trop result, Kim in lab advised they would check on reason for delay of results.

## 2020-10-18 NOTE — ED Notes (Signed)
Patient verbalized understanding of dc instructions, vss, ambulatory with nad.   

## 2020-10-18 NOTE — Discharge Instructions (Addendum)
Have sent refill to pharmacy for you. Dr. Burt Knack has been messaged about ER visit, he should be following up with you. Return here for any new/acute changes.

## 2020-10-19 ENCOUNTER — Ambulatory Visit: Payer: Federal, State, Local not specified - PPO | Admitting: Cardiology

## 2020-10-19 ENCOUNTER — Emergency Department (HOSPITAL_COMMUNITY): Payer: Federal, State, Local not specified - PPO

## 2020-10-19 ENCOUNTER — Other Ambulatory Visit (HOSPITAL_COMMUNITY)
Admission: RE | Admit: 2020-10-19 | Discharge: 2020-10-19 | Disposition: A | Discharge status: Home / Self Care | Payer: Federal, State, Local not specified - PPO | Source: Ambulatory Visit | Attending: Cardiology | Admitting: Cardiology

## 2020-10-19 ENCOUNTER — Other Ambulatory Visit: Payer: Self-pay

## 2020-10-19 ENCOUNTER — Encounter: Payer: Self-pay | Admitting: Cardiology

## 2020-10-19 ENCOUNTER — Inpatient Hospital Stay (HOSPITAL_COMMUNITY)
Admission: EM | Admit: 2020-10-19 | Discharge: 2020-11-01 | DRG: 280 | Disposition: A | Discharge status: Hospice | Payer: Federal, State, Local not specified - PPO | Attending: Internal Medicine | Admitting: Internal Medicine

## 2020-10-19 ENCOUNTER — Inpatient Hospital Stay (HOSPITAL_COMMUNITY): Payer: Federal, State, Local not specified - PPO

## 2020-10-19 VITALS — BP 120/60 | HR 100 | Ht 64.0 in | Wt 181.6 lb

## 2020-10-19 DIAGNOSIS — J189 Pneumonia, unspecified organism: Secondary | ICD-10-CM | POA: Diagnosis not present

## 2020-10-19 DIAGNOSIS — Z4682 Encounter for fitting and adjustment of non-vascular catheter: Secondary | ICD-10-CM | POA: Diagnosis not present

## 2020-10-19 DIAGNOSIS — Z951 Presence of aortocoronary bypass graft: Secondary | ICD-10-CM | POA: Diagnosis not present

## 2020-10-19 DIAGNOSIS — Z66 Do not resuscitate: Secondary | ICD-10-CM | POA: Diagnosis not present

## 2020-10-19 DIAGNOSIS — I214 Non-ST elevation (NSTEMI) myocardial infarction: Secondary | ICD-10-CM | POA: Diagnosis not present

## 2020-10-19 DIAGNOSIS — I1310 Hypertensive heart and chronic kidney disease without heart failure, with stage 1 through stage 4 chronic kidney disease, or unspecified chronic kidney disease: Secondary | ICD-10-CM | POA: Diagnosis not present

## 2020-10-19 DIAGNOSIS — J948 Other specified pleural conditions: Secondary | ICD-10-CM | POA: Diagnosis not present

## 2020-10-19 DIAGNOSIS — Z955 Presence of coronary angioplasty implant and graft: Secondary | ICD-10-CM

## 2020-10-19 DIAGNOSIS — R531 Weakness: Secondary | ICD-10-CM | POA: Diagnosis not present

## 2020-10-19 DIAGNOSIS — E118 Type 2 diabetes mellitus with unspecified complications: Secondary | ICD-10-CM | POA: Diagnosis not present

## 2020-10-19 DIAGNOSIS — J9601 Acute respiratory failure with hypoxia: Secondary | ICD-10-CM | POA: Diagnosis not present

## 2020-10-19 DIAGNOSIS — R079 Chest pain, unspecified: Secondary | ICD-10-CM | POA: Diagnosis not present

## 2020-10-19 DIAGNOSIS — E785 Hyperlipidemia, unspecified: Secondary | ICD-10-CM | POA: Diagnosis present

## 2020-10-19 DIAGNOSIS — Z7982 Long term (current) use of aspirin: Secondary | ICD-10-CM

## 2020-10-19 DIAGNOSIS — J811 Chronic pulmonary edema: Secondary | ICD-10-CM | POA: Diagnosis not present

## 2020-10-19 DIAGNOSIS — Z79899 Other long term (current) drug therapy: Secondary | ICD-10-CM

## 2020-10-19 DIAGNOSIS — C787 Secondary malignant neoplasm of liver and intrahepatic bile duct: Secondary | ICD-10-CM | POA: Diagnosis present

## 2020-10-19 DIAGNOSIS — R0989 Other specified symptoms and signs involving the circulatory and respiratory systems: Secondary | ICD-10-CM | POA: Diagnosis not present

## 2020-10-19 DIAGNOSIS — I2119 ST elevation (STEMI) myocardial infarction involving other coronary artery of inferior wall: Secondary | ICD-10-CM

## 2020-10-19 DIAGNOSIS — Z9221 Personal history of antineoplastic chemotherapy: Secondary | ICD-10-CM

## 2020-10-19 DIAGNOSIS — E8770 Fluid overload, unspecified: Secondary | ICD-10-CM | POA: Diagnosis present

## 2020-10-19 DIAGNOSIS — Z886 Allergy status to analgesic agent status: Secondary | ICD-10-CM

## 2020-10-19 DIAGNOSIS — C189 Malignant neoplasm of colon, unspecified: Secondary | ICD-10-CM | POA: Diagnosis not present

## 2020-10-19 DIAGNOSIS — R778 Other specified abnormalities of plasma proteins: Secondary | ICD-10-CM | POA: Diagnosis not present

## 2020-10-19 DIAGNOSIS — J8489 Other specified interstitial pulmonary diseases: Secondary | ICD-10-CM | POA: Diagnosis not present

## 2020-10-19 DIAGNOSIS — I130 Hypertensive heart and chronic kidney disease with heart failure and stage 1 through stage 4 chronic kidney disease, or unspecified chronic kidney disease: Secondary | ICD-10-CM | POA: Diagnosis present

## 2020-10-19 DIAGNOSIS — I4891 Unspecified atrial fibrillation: Secondary | ICD-10-CM | POA: Diagnosis not present

## 2020-10-19 DIAGNOSIS — J984 Other disorders of lung: Secondary | ICD-10-CM | POA: Diagnosis not present

## 2020-10-19 DIAGNOSIS — R0789 Other chest pain: Secondary | ICD-10-CM | POA: Diagnosis not present

## 2020-10-19 DIAGNOSIS — E11649 Type 2 diabetes mellitus with hypoglycemia without coma: Secondary | ICD-10-CM | POA: Diagnosis not present

## 2020-10-19 DIAGNOSIS — Z9289 Personal history of other medical treatment: Secondary | ICD-10-CM

## 2020-10-19 DIAGNOSIS — R570 Cardiogenic shock: Secondary | ICD-10-CM | POA: Diagnosis not present

## 2020-10-19 DIAGNOSIS — Z8249 Family history of ischemic heart disease and other diseases of the circulatory system: Secondary | ICD-10-CM

## 2020-10-19 DIAGNOSIS — J9600 Acute respiratory failure, unspecified whether with hypoxia or hypercapnia: Secondary | ICD-10-CM

## 2020-10-19 DIAGNOSIS — I10 Essential (primary) hypertension: Secondary | ICD-10-CM

## 2020-10-19 DIAGNOSIS — I519 Heart disease, unspecified: Secondary | ICD-10-CM | POA: Diagnosis not present

## 2020-10-19 DIAGNOSIS — I480 Paroxysmal atrial fibrillation: Secondary | ICD-10-CM | POA: Diagnosis present

## 2020-10-19 DIAGNOSIS — Z888 Allergy status to other drugs, medicaments and biological substances status: Secondary | ICD-10-CM

## 2020-10-19 DIAGNOSIS — I517 Cardiomegaly: Secondary | ICD-10-CM | POA: Diagnosis not present

## 2020-10-19 DIAGNOSIS — E1151 Type 2 diabetes mellitus with diabetic peripheral angiopathy without gangrene: Secondary | ICD-10-CM | POA: Diagnosis present

## 2020-10-19 DIAGNOSIS — Z452 Encounter for adjustment and management of vascular access device: Secondary | ICD-10-CM

## 2020-10-19 DIAGNOSIS — I110 Hypertensive heart disease with heart failure: Secondary | ICD-10-CM | POA: Diagnosis not present

## 2020-10-19 DIAGNOSIS — I249 Acute ischemic heart disease, unspecified: Secondary | ICD-10-CM | POA: Diagnosis not present

## 2020-10-19 DIAGNOSIS — N189 Chronic kidney disease, unspecified: Secondary | ICD-10-CM | POA: Diagnosis present

## 2020-10-19 DIAGNOSIS — R0902 Hypoxemia: Secondary | ICD-10-CM

## 2020-10-19 DIAGNOSIS — Z85038 Personal history of other malignant neoplasm of large intestine: Secondary | ICD-10-CM | POA: Diagnosis not present

## 2020-10-19 DIAGNOSIS — J80 Acute respiratory distress syndrome: Secondary | ICD-10-CM | POA: Diagnosis not present

## 2020-10-19 DIAGNOSIS — Z9689 Presence of other specified functional implants: Secondary | ICD-10-CM | POA: Diagnosis not present

## 2020-10-19 DIAGNOSIS — Z20822 Contact with and (suspected) exposure to covid-19: Secondary | ICD-10-CM | POA: Insufficient documentation

## 2020-10-19 DIAGNOSIS — Z4659 Encounter for fitting and adjustment of other gastrointestinal appliance and device: Secondary | ICD-10-CM

## 2020-10-19 DIAGNOSIS — E1110 Type 2 diabetes mellitus with ketoacidosis without coma: Secondary | ICD-10-CM | POA: Diagnosis not present

## 2020-10-19 DIAGNOSIS — Z794 Long term (current) use of insulin: Secondary | ICD-10-CM

## 2020-10-19 DIAGNOSIS — E782 Mixed hyperlipidemia: Secondary | ICD-10-CM | POA: Diagnosis present

## 2020-10-19 DIAGNOSIS — I350 Nonrheumatic aortic (valve) stenosis: Secondary | ICD-10-CM | POA: Diagnosis not present

## 2020-10-19 DIAGNOSIS — Z7902 Long term (current) use of antithrombotics/antiplatelets: Secondary | ICD-10-CM

## 2020-10-19 DIAGNOSIS — I255 Ischemic cardiomyopathy: Secondary | ICD-10-CM | POA: Diagnosis present

## 2020-10-19 DIAGNOSIS — J9811 Atelectasis: Secondary | ICD-10-CM | POA: Diagnosis not present

## 2020-10-19 DIAGNOSIS — C7800 Secondary malignant neoplasm of unspecified lung: Secondary | ICD-10-CM | POA: Diagnosis present

## 2020-10-19 DIAGNOSIS — Z6836 Body mass index (BMI) 36.0-36.9, adult: Secondary | ICD-10-CM

## 2020-10-19 DIAGNOSIS — N179 Acute kidney failure, unspecified: Secondary | ICD-10-CM

## 2020-10-19 DIAGNOSIS — I21A1 Myocardial infarction type 2: Secondary | ICD-10-CM | POA: Diagnosis present

## 2020-10-19 DIAGNOSIS — Z01812 Encounter for preprocedural laboratory examination: Secondary | ICD-10-CM | POA: Insufficient documentation

## 2020-10-19 DIAGNOSIS — R091 Pleurisy: Secondary | ICD-10-CM | POA: Diagnosis not present

## 2020-10-19 DIAGNOSIS — Z9049 Acquired absence of other specified parts of digestive tract: Secondary | ICD-10-CM

## 2020-10-19 DIAGNOSIS — I5043 Acute on chronic combined systolic (congestive) and diastolic (congestive) heart failure: Secondary | ICD-10-CM | POA: Diagnosis not present

## 2020-10-19 DIAGNOSIS — R0602 Shortness of breath: Secondary | ICD-10-CM

## 2020-10-19 DIAGNOSIS — Z833 Family history of diabetes mellitus: Secondary | ICD-10-CM

## 2020-10-19 DIAGNOSIS — E1122 Type 2 diabetes mellitus with diabetic chronic kidney disease: Secondary | ICD-10-CM | POA: Diagnosis present

## 2020-10-19 DIAGNOSIS — J849 Interstitial pulmonary disease, unspecified: Secondary | ICD-10-CM | POA: Diagnosis not present

## 2020-10-19 DIAGNOSIS — Z515 Encounter for palliative care: Secondary | ICD-10-CM | POA: Diagnosis not present

## 2020-10-19 DIAGNOSIS — E440 Moderate protein-calorie malnutrition: Secondary | ICD-10-CM | POA: Diagnosis present

## 2020-10-19 DIAGNOSIS — N170 Acute kidney failure with tubular necrosis: Secondary | ICD-10-CM | POA: Diagnosis not present

## 2020-10-19 DIAGNOSIS — E1159 Type 2 diabetes mellitus with other circulatory complications: Secondary | ICD-10-CM | POA: Diagnosis not present

## 2020-10-19 DIAGNOSIS — I25110 Atherosclerotic heart disease of native coronary artery with unstable angina pectoris: Secondary | ICD-10-CM | POA: Diagnosis not present

## 2020-10-19 DIAGNOSIS — J810 Acute pulmonary edema: Secondary | ICD-10-CM | POA: Diagnosis not present

## 2020-10-19 DIAGNOSIS — I200 Unstable angina: Secondary | ICD-10-CM | POA: Diagnosis present

## 2020-10-19 DIAGNOSIS — I252 Old myocardial infarction: Secondary | ICD-10-CM

## 2020-10-19 DIAGNOSIS — E1059 Type 1 diabetes mellitus with other circulatory complications: Secondary | ICD-10-CM | POA: Diagnosis not present

## 2020-10-19 DIAGNOSIS — Z6826 Body mass index (BMI) 26.0-26.9, adult: Secondary | ICD-10-CM

## 2020-10-19 DIAGNOSIS — I25700 Atherosclerosis of coronary artery bypass graft(s), unspecified, with unstable angina pectoris: Secondary | ICD-10-CM | POA: Diagnosis present

## 2020-10-19 DIAGNOSIS — R0600 Dyspnea, unspecified: Secondary | ICD-10-CM | POA: Diagnosis not present

## 2020-10-19 DIAGNOSIS — K219 Gastro-esophageal reflux disease without esophagitis: Secondary | ICD-10-CM | POA: Diagnosis present

## 2020-10-19 DIAGNOSIS — E669 Obesity, unspecified: Secondary | ICD-10-CM | POA: Diagnosis present

## 2020-10-19 DIAGNOSIS — I483 Typical atrial flutter: Secondary | ICD-10-CM | POA: Diagnosis not present

## 2020-10-19 DIAGNOSIS — I2510 Atherosclerotic heart disease of native coronary artery without angina pectoris: Secondary | ICD-10-CM | POA: Diagnosis not present

## 2020-10-19 DIAGNOSIS — I739 Peripheral vascular disease, unspecified: Secondary | ICD-10-CM

## 2020-10-19 DIAGNOSIS — I248 Other forms of acute ischemic heart disease: Secondary | ICD-10-CM | POA: Diagnosis not present

## 2020-10-19 DIAGNOSIS — Z938 Other artificial opening status: Secondary | ICD-10-CM

## 2020-10-19 DIAGNOSIS — I509 Heart failure, unspecified: Secondary | ICD-10-CM

## 2020-10-19 DIAGNOSIS — I5021 Acute systolic (congestive) heart failure: Secondary | ICD-10-CM | POA: Diagnosis not present

## 2020-10-19 DIAGNOSIS — R000 Tachycardia, unspecified: Secondary | ICD-10-CM | POA: Diagnosis present

## 2020-10-19 DIAGNOSIS — J90 Pleural effusion, not elsewhere classified: Secondary | ICD-10-CM | POA: Diagnosis not present

## 2020-10-19 DIAGNOSIS — J9690 Respiratory failure, unspecified, unspecified whether with hypoxia or hypercapnia: Secondary | ICD-10-CM | POA: Diagnosis not present

## 2020-10-19 DIAGNOSIS — R072 Precordial pain: Secondary | ICD-10-CM | POA: Diagnosis not present

## 2020-10-19 DIAGNOSIS — Z7189 Other specified counseling: Secondary | ICD-10-CM | POA: Diagnosis not present

## 2020-10-19 DIAGNOSIS — Z7901 Long term (current) use of anticoagulants: Secondary | ICD-10-CM

## 2020-10-19 DIAGNOSIS — N182 Chronic kidney disease, stage 2 (mild): Secondary | ICD-10-CM | POA: Diagnosis not present

## 2020-10-19 DIAGNOSIS — R911 Solitary pulmonary nodule: Secondary | ICD-10-CM | POA: Diagnosis not present

## 2020-10-19 DIAGNOSIS — I5023 Acute on chronic systolic (congestive) heart failure: Secondary | ICD-10-CM | POA: Diagnosis not present

## 2020-10-19 LAB — BASIC METABOLIC PANEL
BUN/Creatinine Ratio: 28 — ABNORMAL HIGH (ref 10–24)
BUN: 25 mg/dL (ref 8–27)
CO2: 24 mmol/L (ref 20–29)
Calcium: 9.4 mg/dL (ref 8.6–10.2)
Chloride: 98 mmol/L (ref 96–106)
Creatinine, Ser: 0.88 mg/dL (ref 0.76–1.27)
GFR calc Af Amer: 106 mL/min/{1.73_m2} (ref >59)
GFR calc non Af Amer: 91 mL/min/{1.73_m2} (ref >59)
Glucose: 425 mg/dL — ABNORMAL HIGH (ref 65–99)
Potassium: 4.4 mmol/L (ref 3.5–5.2)
Sodium: 135 mmol/L (ref 134–144)

## 2020-10-19 LAB — COMPREHENSIVE METABOLIC PANEL
ALT: 25 U/L (ref 0–44)
AST: 24 U/L (ref 15–41)
Albumin: 3.3 g/dL — ABNORMAL LOW (ref 3.5–5.0)
Alkaline Phosphatase: 118 U/L (ref 38–126)
Anion gap: 17 — ABNORMAL HIGH (ref 5–15)
BUN: 24 mg/dL — ABNORMAL HIGH (ref 8–23)
CO2: 19 mmol/L — ABNORMAL LOW (ref 22–32)
Calcium: 9.2 mg/dL (ref 8.9–10.3)
Chloride: 97 mmol/L — ABNORMAL LOW (ref 98–111)
Creatinine, Ser: 1.3 mg/dL — ABNORMAL HIGH (ref 0.61–1.24)
GFR, Estimated: >60 mL/min (ref >60)
Glucose, Bld: 431 mg/dL — ABNORMAL HIGH (ref 70–99)
Potassium: 4.3 mmol/L (ref 3.5–5.1)
Sodium: 133 mmol/L — ABNORMAL LOW (ref 135–145)
Total Bilirubin: 1.2 mg/dL (ref 0.3–1.2)
Total Protein: 6.8 g/dL (ref 6.5–8.1)

## 2020-10-19 LAB — CBC
Hematocrit: 33 % — ABNORMAL LOW (ref 37.5–51.0)
Hemoglobin: 10 g/dL — ABNORMAL LOW (ref 13.0–17.7)
MCH: 25.1 pg — ABNORMAL LOW (ref 26.6–33.0)
MCHC: 30.3 g/dL — ABNORMAL LOW (ref 31.5–35.7)
MCV: 83 fL (ref 79–97)
Platelets: 310 10*3/uL (ref 150–450)
RBC: 3.98 x10E6/uL — ABNORMAL LOW (ref 4.14–5.80)
RDW: 16.7 % — ABNORMAL HIGH (ref 11.6–15.4)
WBC: 13.8 10*3/uL — ABNORMAL HIGH (ref 3.4–10.8)

## 2020-10-19 LAB — HEMOGLOBIN A1C
Hgb A1c MFr Bld: 13.2 % — ABNORMAL HIGH (ref 4.8–5.6)
Mean Plasma Glucose: 332.14 mg/dL

## 2020-10-19 LAB — CBC WITH DIFFERENTIAL/PLATELET
Abs Immature Granulocytes: 0.16 10*3/uL — ABNORMAL HIGH (ref 0.00–0.07)
Basophils Absolute: 0.1 10*3/uL (ref 0.0–0.1)
Basophils Relative: 0 %
Eosinophils Absolute: 0 10*3/uL (ref 0.0–0.5)
Eosinophils Relative: 0 %
HCT: 36 % — ABNORMAL LOW (ref 39.0–52.0)
Hemoglobin: 11.2 g/dL — ABNORMAL LOW (ref 13.0–17.0)
Immature Granulocytes: 1 %
Lymphocytes Relative: 8 %
Lymphs Abs: 1.5 10*3/uL (ref 0.7–4.0)
MCH: 25.5 pg — ABNORMAL LOW (ref 26.0–34.0)
MCHC: 31.1 g/dL (ref 30.0–36.0)
MCV: 82 fL (ref 80.0–100.0)
Monocytes Absolute: 1.4 10*3/uL — ABNORMAL HIGH (ref 0.1–1.0)
Monocytes Relative: 8 %
Neutro Abs: 15 10*3/uL — ABNORMAL HIGH (ref 1.7–7.7)
Neutrophils Relative %: 83 %
Platelets: 349 10*3/uL (ref 150–400)
RBC: 4.39 MIL/uL (ref 4.22–5.81)
RDW: 15.4 % (ref 11.5–15.5)
WBC: 18.1 10*3/uL — ABNORMAL HIGH (ref 4.0–10.5)
nRBC: 0 % (ref 0.0–0.2)

## 2020-10-19 LAB — LIPID PANEL
Cholesterol: 120 mg/dL (ref 0–200)
HDL: 21 mg/dL — ABNORMAL LOW (ref >40)
LDL Cholesterol: 52 mg/dL (ref 0–99)
Total CHOL/HDL Ratio: 5.7 RATIO
Triglycerides: 234 mg/dL — ABNORMAL HIGH (ref <150)
VLDL: 47 mg/dL — ABNORMAL HIGH (ref 0–40)

## 2020-10-19 LAB — TYPE AND SCREEN
ABO/RH(D): O NEG
Antibody Screen: NEGATIVE

## 2020-10-19 LAB — TROPONIN I (HIGH SENSITIVITY): Troponin I (High Sensitivity): 51 ng/L — ABNORMAL HIGH (ref <18)

## 2020-10-19 LAB — SARS CORONAVIRUS 2 (TAT 6-24 HRS): SARS Coronavirus 2: NEGATIVE

## 2020-10-19 LAB — RESP PANEL BY RT-PCR (FLU A&B, COVID) ARPGX2
Influenza A by PCR: NEGATIVE
Influenza B by PCR: NEGATIVE
SARS Coronavirus 2 by RT PCR: NEGATIVE

## 2020-10-19 LAB — BRAIN NATRIURETIC PEPTIDE: B Natriuretic Peptide: 688.7 pg/mL — ABNORMAL HIGH (ref 0.0–100.0)

## 2020-10-19 LAB — PROTIME-INR
INR: 1.4 — ABNORMAL HIGH (ref 0.8–1.2)
Prothrombin Time: 16.6 seconds — ABNORMAL HIGH (ref 11.4–15.2)

## 2020-10-19 LAB — APTT: aPTT: 31 seconds (ref 24–36)

## 2020-10-19 MED ORDER — RANOLAZINE ER 500 MG PO TB12
500.0000 mg | ORAL_TABLET | Freq: Two times a day (BID) | ORAL | Status: DC
  Administered 2020-10-20 – 2020-10-22 (×4): 500 mg via ORAL
  Filled 2020-10-19 (×8): qty 1

## 2020-10-19 MED ORDER — ICOSAPENT ETHYL 1 G PO CAPS
2.0000 g | ORAL_CAPSULE | Freq: Two times a day (BID) | ORAL | Status: DC
  Administered 2020-10-20 (×3): 2 g via ORAL
  Filled 2020-10-19 (×6): qty 2

## 2020-10-19 MED ORDER — PANTOPRAZOLE SODIUM 40 MG PO TBEC
40.0000 mg | DELAYED_RELEASE_TABLET | Freq: Every day | ORAL | Status: DC
  Administered 2020-10-20: 40 mg via ORAL
  Filled 2020-10-19 (×2): qty 1

## 2020-10-19 MED ORDER — RAMIPRIL 10 MG PO CAPS
10.0000 mg | ORAL_CAPSULE | Freq: Every day | ORAL | Status: DC
  Administered 2020-10-20: 10 mg via ORAL
  Filled 2020-10-19 (×2): qty 1

## 2020-10-19 MED ORDER — CLOPIDOGREL BISULFATE 75 MG PO TABS
75.0000 mg | ORAL_TABLET | Freq: Every day | ORAL | Status: DC
  Administered 2020-10-20: 75 mg via ORAL
  Filled 2020-10-19 (×2): qty 1

## 2020-10-19 MED ORDER — FENTANYL CITRATE (PF) 100 MCG/2ML IJ SOLN
50.0000 ug | Freq: Once | INTRAMUSCULAR | Status: AC
  Administered 2020-10-19: 50 ug via INTRAVENOUS
  Filled 2020-10-19: qty 2

## 2020-10-19 MED ORDER — ACETAMINOPHEN 325 MG PO TABS: 650.0000 mg | ORAL_TABLET | ORAL | Status: DC | PRN

## 2020-10-19 MED ORDER — NITROGLYCERIN IN D5W 200-5 MCG/ML-% IV SOLN
0.0000 ug/min | INTRAVENOUS | Status: DC
  Administered 2020-10-19: 5 ug/min via INTRAVENOUS
  Administered 2020-10-20: 20 ug/min via INTRAVENOUS
  Filled 2020-10-19: qty 250

## 2020-10-19 MED ORDER — ASPIRIN EC 81 MG PO TBEC
81.0000 mg | DELAYED_RELEASE_TABLET | Freq: Every day | ORAL | Status: DC
  Administered 2020-10-20: 81 mg via ORAL
  Filled 2020-10-19 (×2): qty 1

## 2020-10-19 MED ORDER — ROSUVASTATIN CALCIUM 20 MG PO TABS
40.0000 mg | ORAL_TABLET | Freq: Every day | ORAL | Status: DC
  Administered 2020-10-20 (×2): 40 mg via ORAL
  Filled 2020-10-19 (×3): qty 2

## 2020-10-19 MED ORDER — METOPROLOL TARTRATE 5 MG/5ML IV SOLN
5.0000 mg | Freq: Once | INTRAVENOUS | Status: AC
  Administered 2020-10-19: 5 mg via INTRAVENOUS
  Filled 2020-10-19: qty 5

## 2020-10-19 MED ORDER — DAPAGLIFLOZIN PROPANEDIOL 10 MG PO TABS
10.0000 mg | ORAL_TABLET | Freq: Every day | ORAL | Status: DC
  Filled 2020-10-19: qty 1

## 2020-10-19 MED ORDER — CLOPIDOGREL BISULFATE 300 MG PO TABS: 300.0000 mg | ORAL_TABLET | ORAL | 0 refills | Status: DC

## 2020-10-19 MED ORDER — HEPARIN SODIUM (PORCINE) 5000 UNIT/ML IJ SOLN
4000.0000 [IU] | Freq: Once | INTRAMUSCULAR | Status: AC
  Administered 2020-10-19: 4000 [IU] via INTRAVENOUS
  Filled 2020-10-19: qty 1

## 2020-10-19 MED ORDER — HEPARIN (PORCINE) 25000 UT/250ML-% IV SOLN
1100.0000 [IU]/h | INTRAVENOUS | Status: DC
  Administered 2020-10-19 – 2020-10-20 (×2): 900 [IU]/h via INTRAVENOUS
  Filled 2020-10-19: qty 250

## 2020-10-19 MED ORDER — NITROGLYCERIN 0.4 MG SL SUBL
0.4000 mg | SUBLINGUAL_TABLET | SUBLINGUAL | Status: DC | PRN
  Administered 2020-10-19: 0.4 mg via SUBLINGUAL
  Filled 2020-10-19 (×2): qty 1

## 2020-10-19 MED ORDER — ONDANSETRON HCL 4 MG/2ML IJ SOLN
4.0000 mg | Freq: Four times a day (QID) | INTRAMUSCULAR | Status: DC | PRN
  Administered 2020-10-20 (×2): 4 mg via INTRAVENOUS
  Filled 2020-10-19 (×4): qty 2

## 2020-10-19 MED ORDER — SODIUM CHLORIDE 0.9 % IV SOLN: INTRAVENOUS | Status: DC

## 2020-10-19 NOTE — ED Notes (Signed)
RN has paged MD Tonna Boehringer, and made him aware of the pts increasing pain and the need for 2 additional doses of fentyal  for breakthrough pain.MD called back no new orders at this time

## 2020-10-19 NOTE — ED Provider Notes (Signed)
[facility] EMERGENCY DEPARTMENT Provider Note   CSN: [id number] Arrival date & time: 10/19/20  2130     History Chief Complaint  Patient presents with  . Chest Pain  . Shortness of Breath    Ian [name] is a 64 y.o. male.  Presents to ER with concern for chest pain.  She reports he has been having intermittent chest pain over the last few days, normally improves with rest and taking his nitroglycerin.  Today started having an episode this afternoon that has been relatively constant, had some improvement but does not resolve with nitro completely.  Currently 6 out of 10 in severity central tightness, nonradiating.  Also states throughout the day he has had increased shortness of breath.  Extensive history of coronary artery disease, saw cardiology in clinic earlier today, they recommended left heart cath and patient is scheduled for this tomorrow.  HPI     Past Medical History:  Diagnosis Date  . 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 Colon cancer ([place])   . Complication of anesthesia    atypical pseudo-cholinesterase deficiency  . Coronary artery disease 01/10/2013   s/p CABG 2008 // s/p STEMI in 08/2019>>DES x 2 to L radial-PDA; staged PCI: DES x 2 to protected LM into LCx // s/p inf-lat STEMI 12/2019 >> DES to L Radial-PDA  . Diabetes mellitus   . Family Hx of adverse reaction to anesthesia    sister also has atypical pseudo-cholinesterase deficiency  . Gastroesophageal reflux disease   . History of heart bypass surgery   . HLD (hyperlipidemia)   . Hypertension   . Ischemic cardiomyopathy    Echo 01/04/2020: EF 45-50, mild LVH, Gr 2 DD, normal RVSF, mild to mod LAE, mild MR, mild AS (mean gradient 6 mmHg)  . Mild aortic stenosis   . Mild mitral regurgitation   . Myocardial infarction ([place])   . PAD (peripheral artery disease) (HCC)    s/p R fem-pop bypass 02/2020 (Dr. Donnetta Hutching)  . Paroxysmal atrial fibrillation (HCC)    CHADS-VASc=3 // noted post op  after fem pop bypass in 02/2020; due to high risk, Apixaban started    Patient Active Problem List   Diagnosis Date Noted  . Goals of care, counseling/discussion 10/11/2020  . Mixed hyperlipidemia 07/26/2020  . PAD (peripheral artery disease) ([place]) 02/25/2020  . Chest pain 02/22/2020  . ST elevation myocardial infarction (STEMI) of inferolateral wall ([place])   . Unstable angina ([place]) 01/04/2020  . Acute coronary syndrome ([place])   . Hx of CABG   . Presence of drug-eluting stent in RCA, LM-LCx 09/15/2019  . Non-ST elevation (NSTEMI) myocardial infarction ([place]) 09/14/2019  . NSTEMI (non-ST elevated myocardial infarction) ([place]) 09/14/2019  . S/P partial colectomy 09/17/2018  . Malignant neoplasm of sigmoid colon ([place])   . Liver mass, right lobe   . Dysfunctional ventilatory weaning response ([place]) 02/07/2015  . Palpitations, recurrent 01/10/2013  . Obesity 01/10/2013  . Essential hypertension 01/10/2013  . Coronary artery disease involving native coronary artery of native heart with unstable angina pectoris ([place]) 01/10/2013  . Hyperlipidemia associated with type 2 diabetes mellitus ([place]) 01/10/2013  . OSA (obstructive sleep apnea), probable 01/10/2013  . Diabetes mellitus type II, uncontrolled ([place]) 01/10/2013  . 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 Chest tightness 01/10/2013    Past Surgical History:  Procedure Laterality Date  . ABDOMINAL AORTOGRAM W/LOWER EXTREMITY Bilateral 02/18/2020   Procedure: ABDOMINAL AORTOGRAM W/LOWER EXTREMITY;  Surgeon: Marty Heck, MD;  Location: [place]  CV LAB;  Service: Cardiovascular;  Laterality: Bilateral;  . COLONOSCOPY N/A 08/29/2018   Procedure: COLONOSCOPY;  Surgeon: Daneil Dolin, MD;  Location: [facility] END[facility] SU[facility];  Service: Endoscopy;  Laterality: N/A;  9:30  . CORONARY ARTERY BYPASS GRAFT     4 vessels  . CORONARY ATHERECTOMY N/A 09/15/2019   Procedure: CORONARY ATHERECTOMY;  Surgeon: Sherren Mocha, MD;  Location: [place] CV  LAB;  Service: Cardiovascular;  LM-LCx Atherectomy - DES PCI (*Protected LM)   . CORONARY STENT INTERVENTION N/A 09/15/2019   Procedure: CORONARY STENT INTERVENTION;  Surgeon: Sherren Mocha, MD;  Location: [place] CV LAB;; Successful orbital atherectomy, PTCA, and overlapping DES treatment of severe calcific stenosis in the left main and left circumflex using a 2.5x34 mm Resolute Onyx DES (left circumflex) and 3.0x30 mm Resolute Onyx DES (overlapped proximally in the LCx extending into left main)  . CORONARY STENT INTERVENTION N/A 01/04/2020   Procedure: CORONARY STENT INTERVENTION;  Surgeon: Sherren Mocha, MD;  Location: [place] CV LAB;  Service: Cardiovascular;  Laterality: N/A;  . CORONARY/GRAFT ACUTE MI REVASCULARIZATION N/A 09/14/2019   Procedure: Coronary/Graft Acute MI Revascularization;  Surgeon: Sherren Mocha, MD;  Location: [place] CV LAB;  Service: Cardiovascular;  Laterality: N/A;  . FEMORAL-POPLITEAL BYPASS GRAFT Right 02/25/2020   Procedure: FEMORAL-ABOVE KNEE POPLITEAL ARTERY BYPASS GRAFT with Saphenous vein;  Surgeon: Rosetta Posner, MD;  Location: [facility] OR;  Service: Vascular;  Laterality: Right;  . heart bypass    . INSERTION OF MESH  02/07/2015   Procedure: INSERTION OF MESH;  Surgeon: Aviva Signs Md, MD;  Location: [facility] ORS;  Service: General;;  . INTRAVASCULAR ULTRASOUND/IVUS N/A 01/04/2020   Procedure: Intravascular Ultrasound/IVUS;  Surgeon: Sherren Mocha, MD;  Location: [place] CV LAB;  Service: Cardiovascular;  Laterality: N/A;  . LEFT HEART CATH AND CORONARY ANGIOGRAPHY N/A 09/14/2019   Procedure: LEFT HEART CATH AND CORONARY ANGIOGRAPHY;  Surgeon: Sherren Mocha, MD;  Location: [place] CV LAB;  Service: Cardiovascular;  Laterality: N/A;  . LEFT HEART CATH AND CORONARY ANGIOGRAPHY N/A 01/04/2020   Procedure: LEFT HEART CATH AND CORONARY ANGIOGRAPHY;  Surgeon: Sherren Mocha, MD;  Location: [place] CV LAB;  Service: Cardiovascular;  Laterality: N/A;   . PARTIAL COLECTOMY N/A 09/17/2018   Procedure: PARTIAL COLECTOMY WITH PARTIAL WEDGE RESECTION LIVER METASTASIS;  Surgeon: Aviva Signs, MD;  Location: [facility] ORS;  Service: General;  Laterality: N/A;  . POLYPECTOMY  08/29/2018   Procedure: POLYPECTOMY;  Surgeon: Daneil Dolin, MD;  Location: [facility];  Service: Endoscopy;;  . PORTACATH PLACEMENT Left 10/10/2018   Procedure: INSERTION PORT-A-CATH (catheter in left subclavian);  Surgeon: Aviva Signs, MD;  Location: [facility] ORS;  Service: General;  Laterality: Left;  . UMBILICAL HERNIA REPAIR N/A 02/07/2015   Procedure: UMBILICAL HERNIORRHAPHY WITH MESH;  Surgeon: Aviva Signs Md, MD;  Location: [facility] OR[facility];  Service: General;  Laterality: N/A;       Family History  Problem Relation Age of Onset  . Hypertension Father   . Diabetes Father   . Heart disease Father   . Diabetes Sister   . Hypertension Sister   . Hypertension Brother   . Diabetes Brother   . Diabetes Sister   . Diabetes Brother   . Heart disease Brother   . Lymphoma Brother   . Hypertension Son     Social History   Tobacco Use  . Smoking status: Never Smoker  . Smokeless tobacco: Never Used  Vaping Use  . Vaping Use: Never  used  Substance Use Topics  . Alcohol use: No  . Drug use: No    Home Medications Prior to Admission medications   Medication Sig Start Date End Date Taking? Authorizing Provider  apixaban (ELIQUIS) 5 MG TABS tablet Take 1 tablet (5 mg total) by mouth 2 (two) times daily. 05/31/20   Sherren Mocha, MD  clopidogrel (PLAVIX) 300 MG TABS tablet Take 1 tablet (300 mg total) by mouth as directed. TAKE THIS PM 10/19/20   Kathyrn Drown D, NP  clopidogrel (PLAVIX) 75 MG tablet Take 75 mg by mouth daily. 10/06/20   [provider]  Coenzyme Q10 (COQ10) 100 MG CAPS Take 100 mg by mouth daily.    [provider]  diltiazem (CARDIZEM CD) 360 MG 24 hr capsule Take 1 capsule (360 mg total) by mouth daily. 04/01/20 03/27/21  Sherren Mocha,  MD  diphenhydramine-acetaminophen (TYLENOL PM) 25-500 MG TABS tablet Take 2 tablets by mouth at bedtime.    [provider]  Evolocumab (REPATHA SURECLICK) 151 MG/ML SOAJ Inject 140 mg into the skin every 14 (fourteen) days. 08/02/20   Sherren Mocha, MD  ezetimibe-simvastatin (VYTORIN) 10-80 MG tablet Take 1 tablet by mouth daily.  09/03/20   [provider]  FARXIGA 10 MG TABS tablet Take 10 mg by mouth daily.  12/19/18   [provider]  hydrochlorothiazide (HYDRODIURIL) 25 MG tablet Take 1 tablet (25 mg total) by mouth daily. 09/24/19   Bhagat, Crista Luria, PA  icosapent Ethyl (VASCEPA) 1 g capsule Take 2 capsules (2 g total) by mouth 2 (two) times daily. 09/27/20   Richardson Dopp T, PA-C  insulin aspart protamine- aspart (NOVOLOG MIX 70/30) (70-30) 100 UNIT/ML injection Inject 20 Units into the skin in the morning, at noon, and at bedtime.     [provider]  Insulin Glargine (BASAGLAR KWIKPEN) 100 UNIT/ML SOPN Inject 40 Units into the skin at bedtime.  12/10/18   [provider]  isosorbide mononitrate (IMDUR) 30 MG 24 hr tablet Take 1 tablet (30 mg total) by mouth every evening. 10/01/20 12/30/20  [place] Donnamarie Poag, DO  isosorbide mononitrate (IMDUR) 60 MG 24 hr tablet Take 1 tablet (60 mg total) by mouth daily. 02/09/20   Kathlen Mody, Scott T, PA-C  JANUVIA 100 MG tablet Take 100 mg by mouth daily.  12/19/18   [provider]  metFORMIN (GLUCOPHAGE) 500 MG tablet Take 500 mg by mouth 2 (two) times daily. 08/27/19   [provider]  metoprolol succinate (TOPROL-XL) 100 MG 24 hr tablet Take 1 tablet (100 mg total) by mouth 2 (two) times daily. Take with or immediately following a meal. 09/16/19   Cheryln Manly, NP  nitroGLYCERIN (NITROSTAT) 0.4 MG SL tablet Place 1 tablet (0.4 mg total) under the tongue every 5 (five) minutes as needed for chest pain. 10/18/20   Larene Pickett, PA-C  pantoprazole (PROTONIX) 40 MG tablet Take 40 mg by mouth  daily.    [provider]  ramipril (ALTACE) 10 MG capsule Take 10 mg by mouth daily.    [provider]  ranolazine (RANEXA) 500 MG 12 hr tablet Take 1 tablet (500 mg total) by mouth 2 (two) times daily. 10/01/20 12/30/20  [place] Donnamarie Poag, DO  rosuvastatin (CRESTOR) 40 MG tablet Take 1 tablet (40 mg total) by mouth at bedtime. 01/06/20   Cheryln Manly, NP    Allergies    Anectine [succinylcholine] and Nsaids  Review of Systems   Review of Systems  Constitutional:  Positive for fatigue. Negative for chills and fever.  HENT: Negative for ear pain and sore throat.   Eyes: Negative for pain and visual disturbance.  Respiratory: Positive for chest tightness and shortness of breath. Negative for cough.   Cardiovascular: Positive for chest pain. Negative for palpitations.  Gastrointestinal: Negative for abdominal pain and vomiting.  Genitourinary: Negative for dysuria and hematuria.  Musculoskeletal: Negative for arthralgias and back pain.  Skin: Negative for color change and rash.  Neurological: Negative for seizures and syncope.  All other systems reviewed and are negative.   Physical Exam Updated Vital Signs BP 136/76 (BP Location: Right Arm)   Pulse (!) 115   Temp 98.5 F (36.9 C) (Oral)   Resp 20   Ht 5\' 4"  (1.626 m)   Wt 80.3 kg   SpO2 95%   BMI 30.38 kg/m   Physical Exam Vitals and nursing note reviewed.  Constitutional:      Comments: Somewhat diaphoretic, increased work of breathing  HENT:     Head: Normocephalic and atraumatic.  Eyes:     Conjunctiva/sclera: Conjunctivae normal.  Cardiovascular:     Rate and Rhythm: Normal rate and regular rhythm.     Heart sounds: No murmur heard.   Pulmonary:     Comments: Crackles at bases, mild tachypnea but speaks in full sentences Abdominal:     Palpations: Abdomen is soft.     Tenderness: There is no abdominal tenderness.  Musculoskeletal:     Cervical back: Neck supple.     Right lower leg: No  edema.     Left lower leg: No edema.  Skin:    General: Skin is warm and dry.  Neurological:     General: No focal deficit present.     Mental Status: He is alert.     ED Results / Procedures / Treatments   Labs (all labs ordered are listed, but only abnormal results are displayed) Labs Reviewed  HEMOGLOBIN A1C - Abnormal; Notable for the following components:      Result Value   Hgb A1c MFr Bld 13.2 (*)    All other components within normal limits  CBC WITH DIFFERENTIAL/PLATELET - Abnormal; Notable for the following components:   WBC 18.1 (*)    Hemoglobin 11.2 (*)    HCT 36.0 (*)    MCH 25.5 (*)    Neutro Abs 15.0 (*)    Monocytes Absolute 1.4 (*)    Abs Immature Granulocytes 0.16 (*)    All other components within normal limits  PROTIME-INR - Abnormal; Notable for the following components:   Prothrombin Time 16.6 (*)    INR 1.4 (*)    All other components within normal limits  COMPREHENSIVE METABOLIC PANEL - Abnormal; Notable for the following components:   Sodium 133 (*)    Chloride 97 (*)    CO2 19 (*)    Glucose, Bld 431 (*)    BUN 24 (*)    Creatinine, Ser 1.30 (*)    Albumin 3.3 (*)    Anion gap 17 (*)    All other components within normal limits  LIPID PANEL - Abnormal; Notable for the following components:   Triglycerides 234 (*)    HDL 21 (*)    VLDL 47 (*)    All other components within normal limits  BRAIN NATRIURETIC PEPTIDE - Abnormal; Notable for the following components:   B Natriuretic Peptide 688.7 (*)    All other components within normal limits  TROPONIN I (HIGH  SENSITIVITY) - Abnormal; Notable for the following components:   Troponin I (High Sensitivity) 51 (*)    All other components within normal limits  RESP PANEL BY RT-PCR (FLU A&B, COVID) ARPGX2  APTT  BASIC METABOLIC PANEL  CBC  APTT  HEPARIN LEVEL (UNFRACTIONATED)  TYPE AND SCREEN  TROPONIN I (HIGH SENSITIVITY)    EKG None  Radiology DG Chest 2 View  Result Date:  10/18/2020 CLINICAL DATA:  Chest pain EXAM: CHEST - 2 VIEW COMPARISON:  09/30/2020 FINDINGS: The lungs are symmetrically expanded and are clear. No pneumothorax or pleural effusion. Coronary artery bypass grafting has been performed. Cardiac size is within normal limits. Left subclavian chest port tip is seen within the superior vena cava. Pulmonary vascularity is normal. No acute bone abnormality. IMPRESSION: No active cardiopulmonary disease. Electronically Signed   By: Fidela Salisbury MD   On: 10/18/2020 02:38   DG Chest Port 1 View  Result Date: 10/19/2020 CLINICAL DATA:  Chest pain, dyspnea EXAM: PORTABLE CHEST 1 VIEW COMPARISON:  10/18/2020 FINDINGS: The lungs are symmetrically expanded. Since the prior examination, there has developed extensive right basilar pulmonary infiltrate, likely related to infection or aspiration. Patchy pulmonary infiltrate is also developed at the left lung base. Multiple pulmonary nodules are again identified within the left lung, better assessed on prior CT examination of 10/06/2020. No pneumothorax. Left subclavian chest port is seen with its tip within the superior vena cava. Coronary artery bypass grafting has been performed. Cardiac size within normal limits. No acute bone abnormality. IMPRESSION: Interval development of bibasilar pulmonary infiltrate, more severe within the right lung base, infection versus aspiration. Electronically Signed   By: Fidela Salisbury MD   On: 10/19/2020 22:50    Procedures .Critical Care Performed by: Lucrezia Starch, MD Authorized by: Lucrezia Starch, MD   Critical care provider statement:    Critical care time (minutes):  45   Critical care was necessary to treat or prevent imminent or life-threatening deterioration of the following conditions:  Cardiac failure   Critical care was time spent personally by me on the following activities:  Discussions with consultants, evaluation of patient's response to treatment, examination  of patient, ordering and performing treatments and interventions, ordering and review of laboratory studies, ordering and review of radiographic studies, pulse oximetry, re-evaluation of patient's condition, obtaining history from patient or surrogate and review of old charts   (including critical care time)  Medications Ordered in ED Medications  0.9 %  sodium chloride infusion ( Intravenous New Bag/Given 10/19/20 2239)  nitroGLYCERIN 50 mg in dextrose 5 % 250 mL (0.2 mg/mL) infusion (15 mcg/min Intravenous Rate/Dose Change 10/19/20 2330)  aspirin EC tablet 81 mg (has no administration in time range)  acetaminophen (TYLENOL) tablet 650 mg (has no administration in time range)  heparin ADULT infusion 100 units/mL (25000 units/262mL sodium chloride 0.45%) (900 Units/hr Intravenous New Bag/Given 10/19/20 2329)  icosapent Ethyl (VASCEPA) 1 g capsule 2 g (has no administration in time range)  ranolazine (RANEXA) 12 hr tablet 500 mg (has no administration in time range)  clopidogrel (PLAVIX) tablet 75 mg (has no administration in time range)  pantoprazole (PROTONIX) EC tablet 40 mg (has no administration in time range)  dapagliflozin propanediol (FARXIGA) tablet 10 mg (has no administration in time range)  ramipril (ALTACE) capsule 10 mg (has no administration in time range)  rosuvastatin (CRESTOR) tablet 40 mg (has no administration in time range)  heparin injection 4,000 Units (4,000 Units Intravenous Given 10/19/20 2153)  metoprolol tartrate (LOPRESSOR) injection 5 mg (5 mg Intravenous Given 10/19/20 2155)  fentaNYL (SUBLIMAZE) injection 50 mcg (50 mcg Intravenous Given 10/19/20 2157)  fentaNYL (SUBLIMAZE) injection 50 mcg (50 mcg Intravenous Given 10/19/20 2327)    ED Course  I have reviewed the triage vital signs and the nursing notes.  Pertinent labs & imaging results that were available during my care of the patient were reviewed by me and considered in my medical decision making (see chart for  details).  Clinical Course as of Oct 19 2337  Wed Oct 19, 2020  2134 D/w Angelena Form regarding ems ekg - ekg does not meet stemi criteria   [RD]  2140 Cards fellow at bedside   [RD]  2140 Present on patient arrival - 6/10 pain, ST depressions but no STEMI will review with STEMI doc   [RD]  Fernando Salinas came to bedside - recommends medical management tonight, defer cath until tomorrow; will defer further management to cards fellow for now   [RD]  2205 Pt now pain free   [RD]    Clinical Course User Index [RD] Lucrezia Starch, MD   MDM Rules/Calculators/A&P                          64 year old male with extensive coronary artery disease presents to ER with concern for chest tightness, difficulty in breathing.  EKG with some ST depressions.  Reviewed EKG with on-call for STEMI, does not meet STEMI criteria.  Based on history concern for acute coronary syndrome, NSTEMI.  Cardiology fellow came to bedside to evaluate patient.  Cardiology currently recommending medical management, they will admit to their service.  Started patient on heparin and nitroglycerin gtt. BNP significantly elevated.  CXR read by radiologist concern for possible infection or aspiration.  Patient denied cough, no fever at home and afebrile here.  Based on the overall clinical picture, higher suspicion for heart failure, primary cardiac etiology.  Will defer antibiotics at this time.  Cardiology to admit.  Medical management for now, likely cath tomorrow.  Final Clinical Impression(s) / ED Diagnoses Final diagnoses:  NSTEMI (non-ST elevated myocardial infarction) ([place])  Acute coronary syndrome ([place])  Acute heart failure, unspecified heart failure type [facility])    Rx / DC Orders ED Discharge Orders    None       Lucrezia Starch, MD 10/19/20 2338

## 2020-10-19 NOTE — Progress Notes (Signed)
[place] for Heparin Indication: chest pain/ACS  Allergies  Allergen Reactions   Anectine [Succinylcholine] Other (See Comments)    Atypical pseudocholinesterase deficiency.    Nsaids Other (See Comments)    Contraindicated (can only have asa 81mg )    Patient Measurements: Height: 5\' 4"  (162.6 cm) Weight: 80.3 kg (177 lb) IBW/kg (Calculated) : 59.2 Heparin Dosing Weight: 75.9 kg  Vital Signs: Temp: 98.5 F (36.9 C) (12/01 2132) Temp Source: Oral (12/01 2132) BP: 136/76 (12/01 2216) Pulse Rate: 115 (12/01 2216)  Labs: Recent Labs    10/18/20 0214 10/18/20 0438 10/19/20 0940 10/19/20 2146  HGB 10.4*  --  10.0* 11.2*  HCT 32.8*  --  33.0* 36.0*  PLT 338  --  310 349  APTT  --   --   --  31  LABPROT  --   --   --  16.6*  INR  --   --   --  1.4*  CREATININE 1.34*  --  0.88  --   TROPONINIHS 27* 44*  --   --     Estimated Creatinine Clearance: 82.2 mL/min (by C-G formula based on SCr of 0.88 mg/dL).   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   Colon cancer ([place])    Complication of anesthesia    atypical pseudo-cholinesterase deficiency   Coronary artery disease 01/10/2013   s/p CABG 2008 // s/p STEMI in 08/2019>>DES x 2 to L radial-PDA; staged PCI: DES x 2 to protected LM into LCx // s/p inf-lat STEMI 12/2019 >> DES to L Radial-PDA   Diabetes mellitus    Family Hx of adverse reaction to anesthesia    sister also has atypical pseudo-cholinesterase deficiency   Gastroesophageal reflux disease    History of heart bypass surgery    HLD (hyperlipidemia)    Hypertension    Ischemic cardiomyopathy    Echo 01/04/2020: EF 45-50, mild LVH, Gr 2 DD, normal RVSF, mild to mod LAE, mild MR, mild AS (mean gradient 6 mmHg)   Mild aortic stenosis    Mild mitral regurgitation    Myocardial infarction ([place])    PAD (peripheral artery disease) ([place])    s/p R fem-pop  bypass 02/2020 (Dr. Donnetta Hutching)   Paroxysmal atrial fibrillation ([place])    CHADS-VASc=3 // noted post op after fem pop bypass in 02/2020; due to high risk, Apixaban started    Medications:  Scheduled:   [START ON 10/20/2020] aspirin EC  81 mg Oral Daily    Assessment: Patient is a 56 yom that presents to the ED with complaints of chest pain. Patient does have a slight bump in trop levels that is trending upward. The patient does take Apixaban PTA for afib.   Goal of Therapy:  aPTT 66-102 seconds Monitor platelets by anticoagulation protocol: Yes   Plan:  - Patient was given a bolus of heparin 4000 untis by the ED  - Start heparin drip @ 900 units/hr   - Will monitor heparin using aPTT until aPTT and Heparin levels correlate - aPTT level in ~ 6 hours  - Monitor patient for s/s of bleeding and CBC while on heparin   Duanne Limerick PharmD. BCPS  10/19/2020,10:56 PM

## 2020-10-19 NOTE — ED Triage Notes (Signed)
Pt BIB EMS from home with c/o chest pain intermit , has appt for cath at 10 am on  10/20/20 , seen Cardiac today , pt got home chest pain intensified, pt took 1 nitro with no relief ,  EMS gave 3 nitro, 324 ASA , 4 mg Zofran.Chest pain now 3/10   PMH: 2 MI (last one 12/2019), HTN, DM,   EKG, per EMS had elevation and depression  VS  145/70 89 RA HR 120 RR 16

## 2020-10-19 NOTE — Patient Instructions (Addendum)
Medication Instructions:  NO CHANGES *If you need a refill on your cardiac medications before your next appointment, please call your pharmacy*   Lab Work: TODAY BMET AND CBC If you have labs (blood work) drawn today and your tests are completely normal, you will receive your results only by: Marland Kitchen MyChart Message (if you have MyChart) OR . A paper copy in the mail If you have any lab test that is abnormal or we need to change your treatment, we will call you to review the results.   Testing/Procedures: Your physician has requested that you have a cardiac catheterization. Cardiac catheterization is used to diagnose and/or treat various heart conditions. Doctors may recommend this procedure for a number of different reasons. The most common reason is to evaluate chest pain. Chest pain can be a symptom of coronary artery disease (CAD), and cardiac catheterization can show whether plaque is narrowing or blocking your heart's arteries. This procedure is also used to evaluate the valves, as well as measure the blood flow and oxygen levels in different parts of your heart. For further information please visit [email or web address]. Please follow instruction sheet, as given.    Follow-Up: At [facility], you and your health needs are our priority.  As part of our continuing mission to provide you with exceptional heart care, we have created designated Provider Care Teams.  These Care Teams include your primary Cardiologist (physician) and Advanced Practice Providers (APPs -  Physician Assistants and Nurse Practitioners) who all work together to provide you with the care you need, when you need it.  We recommend signing up for the patient portal called "MyChart".  Sign up information is provided on this After Visit Summary.  MyChart is used to connect with patients for Virtual Visits (Telemedicine).  Patients are able to view lab/test results, encounter notes, upcoming appointments, etc.  Non-urgent  messages can be sent to your provider as well.   To learn more about what you can do with MyChart, go to [email or web address].    Your next appointment:  7 -10 DAYS AFTER CATHERIZATION The format for your next appointment:   In Person  Provider:   You may see Sherren Mocha, MD or one of the following Advanced Practice Providers on your designated Care Team:    Richardson Dopp, PA-C  Robbie Lis, [place]    Other Instructions    [place] OFFICE [place], [place] [address] Dept: [phone] Loc: [place]  10/19/2020  You are scheduled for a Cardiac Catheterization on Thursday, December 2 with Dr. Glenetta Hew.  1. Please arrive at the [facility] (Main Entrance A) at [facility]: [address] [place], [place] [address] at 10:00 AM (This time is two hours before your procedure to ensure your preparation). Free valet parking service is available.   Special note: Every effort is made to have your procedure done on time. Please understand that emergencies sometimes delay scheduled procedures.  2. Diet: Do not eat solid foods after midnight.  The patient may have clear liquids until 5am upon the day of the procedure.  3. Labs: You will need to have blood drawn on Wednesday, December 1 at [facility]. [address]  Open: 7:30am - 5pm    Phone: [phone]. You do not need to be fasting.  4. Medication instructions in preparation for your procedure:TAKE CLOPIDIGREL 300 MG  THIS PM    Contrast Allergy: No   *For reference purposes while preparing patient instructions.   Delete this med list prior to printing instructions for patient.*  HOLD PM AND AM DOSE OF ELIQUIS     HOLD METFORMIN 24 HOURS BEFORE CATHETERIZATION AND 48 HOURS AFTER  On the morning of your procedure, take your Plavix/Clopidogrel and  any morning medicines NOT listed above.  You may use sips of water.  5. Plan for one night stay--bring personal belongings. 6. Bring a current list of your medications and current insurance cards. 7. You MUST have a responsible person to drive you home. 8. Someone MUST be with you the first 24 hours after you arrive home or your discharge will be delayed. 9. Please wear clothes that are easy to get on and off and wear slip-on shoes.  Thank you for allowing [place] to care for you!   -- [facility]

## 2020-10-19 NOTE — ED Notes (Signed)
Pt made RN aware that pain increased from 2- to 8 , MD Dyktra made aware states to give SL nitro and start nitro drip , RN will continue to monitor

## 2020-10-20 ENCOUNTER — Inpatient Hospital Stay (HOSPITAL_COMMUNITY): Payer: Federal, State, Local not specified - PPO

## 2020-10-20 ENCOUNTER — Ambulatory Visit (HOSPITAL_COMMUNITY)
Admission: RE | Admit: 2020-10-20 | Payer: Federal, State, Local not specified - PPO | Source: Home / Self Care | Admitting: Cardiology

## 2020-10-20 ENCOUNTER — Encounter (HOSPITAL_COMMUNITY)
Admission: EM | Disposition: A | Discharge status: Home / Self Care | Payer: Self-pay | Source: Home / Self Care | Attending: Internal Medicine

## 2020-10-20 ENCOUNTER — Encounter (HOSPITAL_COMMUNITY): Payer: Self-pay | Admitting: Internal Medicine

## 2020-10-20 DIAGNOSIS — E1059 Type 1 diabetes mellitus with other circulatory complications: Secondary | ICD-10-CM | POA: Diagnosis not present

## 2020-10-20 DIAGNOSIS — I248 Other forms of acute ischemic heart disease: Secondary | ICD-10-CM

## 2020-10-20 DIAGNOSIS — C787 Secondary malignant neoplasm of liver and intrahepatic bile duct: Secondary | ICD-10-CM

## 2020-10-20 DIAGNOSIS — C189 Malignant neoplasm of colon, unspecified: Secondary | ICD-10-CM | POA: Diagnosis not present

## 2020-10-20 DIAGNOSIS — I200 Unstable angina: Secondary | ICD-10-CM

## 2020-10-20 DIAGNOSIS — I25110 Atherosclerotic heart disease of native coronary artery with unstable angina pectoris: Secondary | ICD-10-CM | POA: Diagnosis not present

## 2020-10-20 DIAGNOSIS — E1110 Type 2 diabetes mellitus with ketoacidosis without coma: Secondary | ICD-10-CM

## 2020-10-20 DIAGNOSIS — I249 Acute ischemic heart disease, unspecified: Secondary | ICD-10-CM | POA: Diagnosis not present

## 2020-10-20 DIAGNOSIS — A419 Sepsis, unspecified organism: Secondary | ICD-10-CM

## 2020-10-20 DIAGNOSIS — I214 Non-ST elevation (NSTEMI) myocardial infarction: Secondary | ICD-10-CM

## 2020-10-20 LAB — GLUCOSE, RANDOM: Glucose, Bld: 536 mg/dL (ref 70–99)

## 2020-10-20 LAB — BASIC METABOLIC PANEL
Anion gap: 23 — ABNORMAL HIGH (ref 5–15)
Anion gap: 19 — ABNORMAL HIGH (ref 5–15)
Anion gap: 15 (ref 5–15)
Anion gap: 12 (ref 5–15)
BUN: 28 mg/dL — ABNORMAL HIGH (ref 8–23)
BUN: 33 mg/dL — ABNORMAL HIGH (ref 8–23)
BUN: 31 mg/dL — ABNORMAL HIGH (ref 8–23)
BUN: 30 mg/dL — ABNORMAL HIGH (ref 8–23)
CO2: 15 mmol/L — ABNORMAL LOW (ref 22–32)
CO2: 17 mmol/L — ABNORMAL LOW (ref 22–32)
CO2: 22 mmol/L (ref 22–32)
CO2: 23 mmol/L (ref 22–32)
Calcium: 9 mg/dL (ref 8.9–10.3)
Calcium: 8.8 mg/dL — ABNORMAL LOW (ref 8.9–10.3)
Calcium: 8.5 mg/dL — ABNORMAL LOW (ref 8.9–10.3)
Calcium: 8.4 mg/dL — ABNORMAL LOW (ref 8.9–10.3)
Chloride: 97 mmol/L — ABNORMAL LOW (ref 98–111)
Chloride: 95 mmol/L — ABNORMAL LOW (ref 98–111)
Chloride: 96 mmol/L — ABNORMAL LOW (ref 98–111)
Chloride: 98 mmol/L (ref 98–111)
Creatinine, Ser: 1.62 mg/dL — ABNORMAL HIGH (ref 0.61–1.24)
Creatinine, Ser: 1.7 mg/dL — ABNORMAL HIGH (ref 0.61–1.24)
Creatinine, Ser: 1.42 mg/dL — ABNORMAL HIGH (ref 0.61–1.24)
Creatinine, Ser: 1.23 mg/dL (ref 0.61–1.24)
GFR, Estimated: 47 mL/min — ABNORMAL LOW (ref >60)
GFR, Estimated: 45 mL/min — ABNORMAL LOW (ref >60)
GFR, Estimated: 56 mL/min — ABNORMAL LOW (ref >60)
GFR, Estimated: >60 mL/min (ref >60)
Glucose, Bld: 511 mg/dL (ref 70–99)
Glucose, Bld: 508 mg/dL (ref 70–99)
Glucose, Bld: 369 mg/dL — ABNORMAL HIGH (ref 70–99)
Glucose, Bld: 221 mg/dL — ABNORMAL HIGH (ref 70–99)
Potassium: 4.2 mmol/L (ref 3.5–5.1)
Potassium: 3.5 mmol/L (ref 3.5–5.1)
Potassium: 3.3 mmol/L — ABNORMAL LOW (ref 3.5–5.1)
Potassium: 3.1 mmol/L — ABNORMAL LOW (ref 3.5–5.1)
Sodium: 135 mmol/L (ref 135–145)
Sodium: 131 mmol/L — ABNORMAL LOW (ref 135–145)
Sodium: 133 mmol/L — ABNORMAL LOW (ref 135–145)
Sodium: 133 mmol/L — ABNORMAL LOW (ref 135–145)

## 2020-10-20 LAB — URINALYSIS, ROUTINE W REFLEX MICROSCOPIC
Bilirubin Urine: NEGATIVE
Glucose, UA: >=500 mg/dL — AB
Ketones, ur: 5 mg/dL — AB
Leukocytes,Ua: NEGATIVE
Nitrite: NEGATIVE
Protein, ur: 30 mg/dL — AB
Specific Gravity, Urine: 1.026 (ref 1.005–1.030)
pH: 5 (ref 5.0–8.0)

## 2020-10-20 LAB — GLUCOSE, CAPILLARY
Glucose-Capillary: 443 mg/dL — ABNORMAL HIGH (ref 70–99)
Glucose-Capillary: 510 mg/dL (ref 70–99)
Glucose-Capillary: 510 mg/dL (ref 70–99)
Glucose-Capillary: 492 mg/dL — ABNORMAL HIGH (ref 70–99)
Glucose-Capillary: 453 mg/dL — ABNORMAL HIGH (ref 70–99)
Glucose-Capillary: 418 mg/dL — ABNORMAL HIGH (ref 70–99)
Glucose-Capillary: 342 mg/dL — ABNORMAL HIGH (ref 70–99)
Glucose-Capillary: 330 mg/dL — ABNORMAL HIGH (ref 70–99)
Glucose-Capillary: 320 mg/dL — ABNORMAL HIGH (ref 70–99)
Glucose-Capillary: 268 mg/dL — ABNORMAL HIGH (ref 70–99)
Glucose-Capillary: 225 mg/dL — ABNORMAL HIGH (ref 70–99)
Glucose-Capillary: 254 mg/dL — ABNORMAL HIGH (ref 70–99)
Glucose-Capillary: 201 mg/dL — ABNORMAL HIGH (ref 70–99)
Glucose-Capillary: 200 mg/dL — ABNORMAL HIGH (ref 70–99)
Glucose-Capillary: 194 mg/dL — ABNORMAL HIGH (ref 70–99)

## 2020-10-20 LAB — CBC
HCT: 33.5 % — ABNORMAL LOW (ref 39.0–52.0)
Hemoglobin: 10.9 g/dL — ABNORMAL LOW (ref 13.0–17.0)
MCH: 26.6 pg (ref 26.0–34.0)
MCHC: 32.5 g/dL (ref 30.0–36.0)
MCV: 81.7 fL (ref 80.0–100.0)
Platelets: 335 10*3/uL (ref 150–400)
RBC: 4.1 MIL/uL — ABNORMAL LOW (ref 4.22–5.81)
RDW: 15.5 % (ref 11.5–15.5)
WBC: 19.4 10*3/uL — ABNORMAL HIGH (ref 4.0–10.5)
nRBC: 0 % (ref 0.0–0.2)

## 2020-10-20 LAB — BETA-HYDROXYBUTYRIC ACID
Beta-Hydroxybutyric Acid: 3.09 mmol/L — ABNORMAL HIGH (ref 0.05–0.27)
Beta-Hydroxybutyric Acid: 0.22 mmol/L (ref 0.05–0.27)

## 2020-10-20 LAB — MRSA PCR SCREENING: MRSA by PCR: NEGATIVE

## 2020-10-20 LAB — TROPONIN I (HIGH SENSITIVITY)
Troponin I (High Sensitivity): 1386 ng/L (ref <18)
Troponin I (High Sensitivity): 4619 ng/L (ref <18)
Troponin I (High Sensitivity): 5929 ng/L (ref <18)

## 2020-10-20 LAB — HEPARIN LEVEL (UNFRACTIONATED)
Heparin Unfractionated: 1.74 IU/mL — ABNORMAL HIGH (ref 0.30–0.70)
Heparin Unfractionated: 1.7 IU/mL — ABNORMAL HIGH (ref 0.30–0.70)

## 2020-10-20 LAB — PROCALCITONIN: Procalcitonin: <0.1 ng/mL

## 2020-10-20 LAB — APTT
aPTT: 42 seconds — ABNORMAL HIGH (ref 24–36)
aPTT: >200 seconds (ref 24–36)
aPTT: 49 seconds — ABNORMAL HIGH (ref 24–36)

## 2020-10-20 LAB — LACTIC ACID, PLASMA: Lactic Acid, Venous: 1.7 mmol/L (ref 0.5–1.9)

## 2020-10-20 SURGERY — LEFT HEART CATH AND CORS/GRAFTS ANGIOGRAPHY
Anesthesia: LOCAL

## 2020-10-20 MED ORDER — FUROSEMIDE 10 MG/ML IJ SOLN
40.0000 mg | Freq: Once | INTRAMUSCULAR | Status: AC
  Administered 2020-10-20: 40 mg via INTRAVENOUS
  Filled 2020-10-20: qty 4

## 2020-10-20 MED ORDER — IOHEXOL 350 MG/ML SOLN
75.0000 mL | Freq: Once | INTRAVENOUS | Status: AC | PRN
  Administered 2020-10-20: 75 mL via INTRAVENOUS

## 2020-10-20 MED ORDER — ALBUTEROL SULFATE (2.5 MG/3ML) 0.083% IN NEBU
2.5000 mg | INHALATION_SOLUTION | Freq: Three times a day (TID) | RESPIRATORY_TRACT | Status: DC
  Filled 2020-10-20: qty 3

## 2020-10-20 MED ORDER — ALBUTEROL SULFATE (2.5 MG/3ML) 0.083% IN NEBU
2.5000 mg | INHALATION_SOLUTION | Freq: Four times a day (QID) | RESPIRATORY_TRACT | Status: DC | PRN
  Administered 2020-10-20: 2.5 mg via RESPIRATORY_TRACT
  Filled 2020-10-20: qty 3

## 2020-10-20 MED ORDER — HEPARIN (PORCINE) 25000 UT/250ML-% IV SOLN
1350.0000 [IU]/h | INTRAVENOUS | Status: DC
  Administered 2020-10-21: 1350 [IU]/h via INTRAVENOUS
  Administered 2020-10-21: 1150 [IU]/h via INTRAVENOUS
  Administered 2020-10-22 – 2020-10-23 (×2): 1350 [IU]/h via INTRAVENOUS
  Filled 2020-10-20 (×6): qty 250

## 2020-10-20 MED ORDER — INSULIN ASPART 100 UNIT/ML ~~LOC~~ SOLN
0.0000 [IU] | Freq: Four times a day (QID) | SUBCUTANEOUS | Status: DC
  Administered 2020-10-20: 15 [IU] via SUBCUTANEOUS

## 2020-10-20 MED ORDER — POTASSIUM CHLORIDE 10 MEQ/100ML IV SOLN
10.0000 meq | INTRAVENOUS | Status: AC
  Administered 2020-10-20 (×2): 10 meq via INTRAVENOUS
  Filled 2020-10-20: qty 100

## 2020-10-20 MED ORDER — SODIUM CHLORIDE 0.9 % IV SOLN
500.0000 mg | Freq: Every day | INTRAVENOUS | Status: AC
  Administered 2020-10-21 – 2020-10-24 (×5): 500 mg via INTRAVENOUS
  Filled 2020-10-20 (×5): qty 500

## 2020-10-20 MED ORDER — DEXTROSE IN LACTATED RINGERS 5 % IV SOLN: INTRAVENOUS | Status: DC

## 2020-10-20 MED ORDER — GUAIFENESIN ER 600 MG PO TB12
600.0000 mg | ORAL_TABLET | Freq: Two times a day (BID) | ORAL | Status: DC
  Administered 2020-10-21: 600 mg via ORAL
  Filled 2020-10-20 (×3): qty 1

## 2020-10-20 MED ORDER — SODIUM CHLORIDE 0.9 % IV SOLN: INTRAVENOUS | Status: DC

## 2020-10-20 MED ORDER — INSULIN ASPART 100 UNIT/ML ~~LOC~~ SOLN
20.0000 [IU] | Freq: Once | SUBCUTANEOUS | Status: AC
  Administered 2020-10-20: 20 [IU] via SUBCUTANEOUS

## 2020-10-20 MED ORDER — DEXTROSE 50 % IV SOLN: 0.0000 mL | INTRAVENOUS | Status: DC | PRN

## 2020-10-20 MED ORDER — INSULIN GLARGINE 100 UNIT/ML ~~LOC~~ SOLN
30.0000 [IU] | Freq: Every day | SUBCUTANEOUS | Status: DC
  Administered 2020-10-20: 30 [IU] via SUBCUTANEOUS
  Filled 2020-10-20 (×2): qty 0.3

## 2020-10-20 MED ORDER — METOPROLOL TARTRATE 5 MG/5ML IV SOLN
5.0000 mg | Freq: Once | INTRAVENOUS | Status: AC
  Administered 2020-10-20: 5 mg via INTRAVENOUS
  Filled 2020-10-20: qty 5

## 2020-10-20 MED ORDER — ALBUTEROL SULFATE (2.5 MG/3ML) 0.083% IN NEBU
2.5000 mg | INHALATION_SOLUTION | Freq: Four times a day (QID) | RESPIRATORY_TRACT | Status: DC
  Administered 2020-10-20 (×3): 2.5 mg via RESPIRATORY_TRACT
  Filled 2020-10-20 (×3): qty 3

## 2020-10-20 MED ORDER — CHLORHEXIDINE GLUCONATE CLOTH 2 % EX PADS
6.0000 | MEDICATED_PAD | Freq: Every day | CUTANEOUS | Status: DC
  Administered 2020-10-21 – 2020-10-31 (×10): 6 via TOPICAL

## 2020-10-20 MED ORDER — ADULT MULTIVITAMIN W/MINERALS CH
1.0000 | ORAL_TABLET | Freq: Every day | ORAL | Status: DC
  Administered 2020-10-20: 1 via ORAL
  Filled 2020-10-20 (×2): qty 1

## 2020-10-20 MED ORDER — VANCOMYCIN HCL 750 MG/150ML IV SOLN
750.0000 mg | Freq: Two times a day (BID) | INTRAVENOUS | Status: DC
  Administered 2020-10-20: 750 mg via INTRAVENOUS
  Filled 2020-10-20 (×2): qty 150

## 2020-10-20 MED ORDER — INSULIN ASPART 100 UNIT/ML ~~LOC~~ SOLN: 20.0000 [IU] | Freq: Once | SUBCUTANEOUS | Status: DC

## 2020-10-20 MED ORDER — METOPROLOL TARTRATE 12.5 MG HALF TABLET
12.5000 mg | ORAL_TABLET | Freq: Two times a day (BID) | ORAL | Status: DC
  Administered 2020-10-20: 12.5 mg via ORAL
  Filled 2020-10-20 (×2): qty 1

## 2020-10-20 MED ORDER — PIPERACILLIN-TAZOBACTAM 3.375 G IVPB
3.3750 g | Freq: Three times a day (TID) | INTRAVENOUS | Status: DC
  Administered 2020-10-20: 3.375 g via INTRAVENOUS
  Filled 2020-10-20 (×2): qty 50

## 2020-10-20 MED ORDER — ENSURE ENLIVE PO LIQD
237.0000 mL | Freq: Two times a day (BID) | ORAL | Status: DC
  Administered 2020-10-20: 237 mL via ORAL

## 2020-10-20 MED ORDER — INSULIN REGULAR(HUMAN) IN NACL 100-0.9 UT/100ML-% IV SOLN
INTRAVENOUS | Status: DC
  Administered 2020-10-20: 11 [IU]/h via INTRAVENOUS
  Administered 2020-10-20: 8.5 [IU]/h via INTRAVENOUS
  Filled 2020-10-20 (×2): qty 100

## 2020-10-20 MED ORDER — SODIUM CHLORIDE 0.9% FLUSH
10.0000 mL | INTRAVENOUS | Status: DC | PRN
  Administered 2020-10-25: 10 mL

## 2020-10-20 MED ORDER — SODIUM CHLORIDE 0.9 % IV SOLN
2.0000 g | Freq: Every day | INTRAVENOUS | Status: AC
  Administered 2020-10-21 – 2020-10-24 (×5): 2 g via INTRAVENOUS
  Filled 2020-10-20 (×5): qty 20

## 2020-10-20 MED ORDER — SODIUM CHLORIDE 0.9 % IV SOLN
3.0000 g | Freq: Three times a day (TID) | INTRAVENOUS | Status: DC
  Administered 2020-10-20 (×2): 3 g via INTRAVENOUS
  Filled 2020-10-20: qty 3
  Filled 2020-10-20: qty 8
  Filled 2020-10-20: qty 3
  Filled 2020-10-20: qty 8

## 2020-10-20 MED ORDER — INSULIN ASPART 100 UNIT/ML ~~LOC~~ SOLN: 15.0000 [IU] | Freq: Once | SUBCUTANEOUS | Status: DC

## 2020-10-20 NOTE — Progress Notes (Signed)
[facility] for Heparin Indication: chest pain/ACS  Allergies  Allergen Reactions  . Anectine [Succinylcholine] Other (See Comments)    Atypical pseudocholinesterase deficiency.   . Nsaids Other (See Comments)    Contraindicated (can only have asa 81mg )    Patient Measurements: Height: 5\' 4"  (162.6 cm) Weight: 80.3 kg (177 lb) IBW/kg (Calculated) : 59.2 Heparin Dosing Weight: 75.9 kg  Vital Signs: Temp: 98.5 F (36.9 C) (12/01 2132) Temp Source: Oral (12/01 2132) BP: 123/64 (12/02 0345) Pulse Rate: 116 (12/02 0345)  Labs: Recent Labs    10/18/20 0214 10/19/20 0940 10/19/20 2146 10/20/20 0121 10/20/20 0324  HGB   < > 10.0* 11.2*  --  10.9*  HCT  --  33.0* 36.0*  --  33.5*  PLT  --  310 349  --  335  APTT  --   --  31  --  42*  LABPROT  --   --  16.6*  --   --   INR  --   --  1.4*  --   --   HEPARINUNFRC  --   --   --   --  1.74*  CREATININE  --  0.88 1.30*  --  1.62*  TROPONINIHS   < >  --  51* 1,386* 4,619*   < > = values in this interval not displayed.    Estimated Creatinine Clearance: 44.6 mL/min (A) (by C-G formula based on SCr of 1.62 mg/dL (H)).   Medical History: Past Medical History:  Diagnosis Date  . 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 Colon cancer ([place])   . Complication of anesthesia    atypical pseudo-cholinesterase deficiency  . Coronary artery disease 01/10/2013   s/p CABG 2008 // s/p STEMI in 08/2019>>DES x 2 to L radial-PDA; staged PCI: DES x 2 to protected LM into LCx // s/p inf-lat STEMI 12/2019 >> DES to L Radial-PDA  . Diabetes mellitus   . Family Hx of adverse reaction to anesthesia    sister also has atypical pseudo-cholinesterase deficiency  . Gastroesophageal reflux disease   . History of heart bypass surgery   . HLD (hyperlipidemia)   . Hypertension   . Ischemic cardiomyopathy    Echo 01/04/2020: EF 45-50, mild LVH, Gr 2 DD, normal RVSF, mild to mod LAE, mild MR, mild AS (mean gradient 6  mmHg)  . Mild aortic stenosis   . Mild mitral regurgitation   . Myocardial infarction ([place])   . PAD (peripheral artery disease) ([facility])    s/p R fem-pop bypass 02/2020 (Dr. Donnetta Hutching)  . Paroxysmal atrial fibrillation ([facility])    CHADS-VASc=3 // noted post op after fem pop bypass in 02/2020; due to high risk, Apixaban started    Medications:  Scheduled:  . aspirin EC  81 mg Oral Daily  . clopidogrel  75 mg Oral Daily  . dapagliflozin propanediol  10 mg Oral Daily  . icosapent Ethyl  2 g Oral BID  . insulin aspart  0-15 Units Subcutaneous Q6H  . insulin aspart  20 Units Subcutaneous Once  . insulin glargine  30 Units Subcutaneous QHS  . pantoprazole  40 mg Oral Daily  . ramipril  10 mg Oral Daily  . ranolazine  500 mg Oral BID  . rosuvastatin  40 mg Oral QHS    Assessment: Patient is a 6 yom that presents to the ED with complaints of chest pain. Patient does have a slight bump in trop levels that is trending upward. The  patient does take Apixaban PTA for afib.   12/2 AM update:  APTT is sub-therapeutic  Troponin up Likely cath today  Goal of Therapy:  Heparin level 0.3-0.7 units/mL aPTT 66-102 seconds Monitor platelets by anticoagulation protocol: Yes   Plan:  -Inc heparin to 1100 units/hr -1300 aPTT/Heparin level  Narda Bonds, PharmD, [place] Pharmacist Phone: [phone]

## 2020-10-20 NOTE — H&P (Addendum)
CARDIOLOGY ADMISSION NOTE  Patient ID: Ian [name] MRN: [id number] DOB/AGE: 08-06-1956 63 y.o.  Admit date: 10/19/2020 Primary Physician   Sharilyn Sites, MD Primary Cardiologist   Sherren Mocha, MD Chief Complaint    CP, SOB  HPI:  Ian [name] is a 64 year old gentleman with a history of CAD s/p CABG (2008) c/b several STEMIs (08/2019 s/p DES x2 to L radial-PDA with DES x 2 to protected LM into LCx; repeat STEMI in 12/2019 s/p DES to L radial-PDA), ICM (EF 40-45%), DMII (A1c 13.2%), GERD, HTN, HLD, PAD s/p R fempop bypass (02/2020), paroxysmal AF (on DOAC), and metastatic colon cancer (newly found hepatic and pulmonary mets with history of prior mets to liver s/p partial colectomy andchemotherapy) who presents to the ED for evaluation of progressive chest pain and shortness of breath.   * Below taken and adapted from Cardiology clinic note by Lynne Logan, NP on 10/19/20.*  Patient with recurrent episodes of angina since September 2021. More recently, he developed LLQ pain and had an outpatient CT performed on 09/28/2020 which showed multiple new hepatic and pulmonary metastatic lesions and is being evaluated by oncology for chemotherapy.  He was seen again by cardiology consult team for persistent chest pain on 09/30/2020. Initially his pain began as a stuttering sensation lasting a few minutes at a time. Over the course of several hours he required 7 sublingual nitroglycerin tablets. On ED arrival, his high-sensitivity troponin was elevated at 268 with a repeat at 241. EKG showed inferior ST depression as well as ST depression in leads V5-V6 similar to tracing from 07/2020. He was started on heparin infusion for NSTEMI. He was treated with conservative management with increasing nitrates and the addition of Ranexa.   His last left heart cath was in February 2021 showed severe multivessel coronary disease.  Since hospital discharge, he has continued to have daily chest pain which prompted  another ED visit on 10/18/20. EKG appeared to be unchanged from prior tracing.  High-sensitivity troponin at was 27>>44. Creatinine elevated from 2 weeks prior from 0.96 to 1.34. CXR without acute cardiopulmonary disease. Patient was discharged home with outpatient cardiology follow-up. At that time he was using almost an entire bottle of sublingual nitroglycerin in a 5 day period. Pain typically would occur with exertion however it was also occurring at rest. Symptoms described as a anterior left-sided chest pressure with radiation to his left arm and neck.    On 10/19/20, he presented for follow up and stated that he was miserable as his chest pain is occurring multiple times per day, both at rest and with exertion. He is unable to perform any task without angina. He states that last night he took approximately 10 SL NTG tablets to help relieve his symptoms. He states symptoms are similar to prior angina with his STEMIs which begins as left sided back pain with radiation to his chest and left arm.  He also reports worsening SOB as well. Plan was made for repeat left heart cath in the outpatient setting on 10/20/20 with ED precautions given.  Given patient continued to have pain after he left the cardiology clinic that was not relieved with nitroglycerin, he called EMS and was brought into the ED for further evaluation.  In the ED, he was initially reporting 6-7/10 in severity chest pain with shortness of breath.  He subsequently given sublingual nitro and started on a nitroglycerin drip, as well as several doses of IV fentanyl brought his pain down  to a level 2.  His ECG showed sinus tachycardia with inferolateral ST depressions.  Case was discussed with interventionalist, decision was made not to take him to the lab given no STEMI and chest pain resolving.  Labs remarkable for a glucose of 425, WBC 13.8 -> 18.1, Hgb 11.2 (stable), sodium 133, serum creatinine 1.30, normal LFTs, initial troponin 51, BNP 688.7.  He was given a bolus of heparin and had already received full dose ASA with EMS.   On re-evaluation, patient reported that his chest pain was overall much improved and that his shortness of breath had now taken center stage.  He kept saying " if I could get my shortness of breath better, I would be ready to go home."  Given his concerning chest x-ray for interval increased bilateral pulmonary infiltrates (compared to study just 2 days ago), his dyspnea, hypoxia, leukocytosis, and diaphoresis the plan was made to get blood cultures and start empiric antibiotics, particularly concerned for aspiration (notably, patient reports several days of nausea and dry heaving without a frank episode of aspiration).  Decision was also made to diurese, as well as obtain CT-PE protocol given his persistent tachycardia and metastatic malignancy.     Past Medical History:  Diagnosis Date  . 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 Colon cancer ([place])   . Complication of anesthesia    atypical pseudo-cholinesterase deficiency  . Coronary artery disease 01/10/2013   s/p CABG 2008 // s/p STEMI in 08/2019>>DES x 2 to L radial-PDA; staged PCI: DES x 2 to protected LM into LCx // s/p inf-lat STEMI 12/2019 >> DES to L Radial-PDA  . Diabetes mellitus   . Family Hx of adverse reaction to anesthesia    sister also has atypical pseudo-cholinesterase deficiency  . Gastroesophageal reflux disease   . History of heart bypass surgery   . HLD (hyperlipidemia)   . Hypertension   . Ischemic cardiomyopathy    Echo 01/04/2020: EF 45-50, mild LVH, Gr 2 DD, normal RVSF, mild to mod LAE, mild MR, mild AS (mean gradient 6 mmHg)  . Mild aortic stenosis   . Mild mitral regurgitation   . Myocardial infarction ([place])   . PAD (peripheral artery disease) ([facility])    s/p R fem-pop bypass 02/2020 (Dr. Donnetta Hutching)  . Paroxysmal atrial fibrillation ([facility])    CHADS-VASc=3 // noted post op after fem pop bypass in 02/2020; due to high risk, Apixaban started     Past Surgical History:  Procedure Laterality Date  . ABDOMINAL AORTOGRAM W/LOWER EXTREMITY Bilateral 02/18/2020   Procedure: ABDOMINAL AORTOGRAM W/LOWER EXTREMITY;  Surgeon: Marty Heck, MD;  Location: [facility];  Service: Cardiovascular;  Laterality: Bilateral;  . COLONOSCOPY N/A 08/29/2018   Procedure: COLONOSCOPY;  Surgeon: Daneil Dolin, MD;  [facility];  Service: Endoscopy;  Laterality: N/A;  9:30  . CORONARY ARTERY BYPASS GRAFT     4 vessels  . CORONARY ATHERECTOMY N/A 09/15/2019   Procedure: CORONARY ATHERECTOMY;  Surgeon: Sherren Mocha, MD;  Location: [facility];  Service: Cardiovascular;  LM-LCx Atherectomy - DES PCI (*Protected LM)   . CORONARY STENT INTERVENTION N/A 09/15/2019   Procedure: CORONARY STENT INTERVENTION;  Surgeon: Sherren Mocha, MD;  Location: [place] [facility];; Successful orbital atherectomy, PTCA, and overlapping DES treatment of severe calcific stenosis in the left main and left circumflex using a 2.5x34 mm Resolute Onyx DES (left circumflex) and 3.0x30 mm Resolute Onyx DES (overlapped proximally in the LCx extending into left main)  .  CORONARY STENT INTERVENTION N/A 01/04/2020   Procedure: CORONARY STENT INTERVENTION;  Surgeon: Sherren Mocha, MD;  Location: [facility];  Service: Cardiovascular;  Laterality: N/A;  . CORONARY/GRAFT ACUTE MI REVASCULARIZATION N/A 09/14/2019   Procedure: Coronary/Graft Acute MI Revascularization;  Surgeon: Sherren Mocha, MD;  Location: [facility];  Service: Cardiovascular;  Laterality: N/A;  . FEMORAL-POPLITEAL BYPASS GRAFT Right 02/25/2020   Procedure: FEMORAL-ABOVE KNEE POPLITEAL ARTERY BYPASS GRAFT with Saphenous vein;  Surgeon: Rosetta Posner, MD;  Location: [facility];  Service: Vascular;  Laterality: Right;  . heart bypass    . INSERTION OF MESH  02/07/2015   Procedure: INSERTION OF MESH;  Surgeon: Aviva Signs Md, MD;  Location: [facility];  Service: General;;  .  INTRAVASCULAR ULTRASOUND/IVUS N/A 01/04/2020   Procedure: Intravascular Ultrasound/IVUS;  Surgeon: Sherren Mocha, MD;  Location: [facility];  Service: Cardiovascular;  Laterality: N/A;  . LEFT HEART CATH AND CORONARY ANGIOGRAPHY N/A 09/14/2019   Procedure: LEFT HEART CATH AND CORONARY ANGIOGRAPHY;  Surgeon: Sherren Mocha, MD;  Location: [place] [facility];  Service: Cardiovascular;  Laterality: N/A;  . LEFT HEART CATH AND CORONARY ANGIOGRAPHY N/A 01/04/2020   Procedure: LEFT HEART CATH AND CORONARY ANGIOGRAPHY;  Surgeon: Sherren Mocha, MD;  Location: [facility];  Service: Cardiovascular;  Laterality: N/A;  . PARTIAL COLECTOMY N/A 09/17/2018   Procedure: PARTIAL COLECTOMY WITH PARTIAL WEDGE RESECTION LIVER METASTASIS;  Surgeon: Aviva Signs, MD;  Location: [facility];  Service: General;  Laterality: N/A;  . POLYPECTOMY  08/29/2018   Procedure: POLYPECTOMY;  Surgeon: Daneil Dolin, MD;  [facility];  Service: Endoscopy;;  . PORTACATH PLACEMENT Left 10/10/2018   Procedure: INSERTION PORT-A-CATH (catheter in left subclavian);  Surgeon: Aviva Signs, MD;  Location: [facility];  Service: General;  Laterality: Left;  . UMBILICAL HERNIA REPAIR N/A 02/07/2015   Procedure: UMBILICAL HERNIORRHAPHY WITH MESH;  Surgeon: Aviva Signs Md, MD;  Location: [facility];  Service: General;  Laterality: N/A;    Allergies  Allergen Reactions  . Anectine [Succinylcholine] Other (See Comments)    Atypical pseudocholinesterase deficiency.   . Nsaids Other (See Comments)    Contraindicated (can only have asa 81mg )   No current facility-administered medications on file prior to encounter.   Current Outpatient Medications on File Prior to Encounter  Medication Sig Dispense Refill  . apixaban (ELIQUIS) 5 MG TABS tablet Take 1 tablet (5 mg total) by mouth 2 (two) times daily. 60 tablet 5  . clopidogrel (PLAVIX) 300 MG TABS tablet Take 1 tablet (300 mg total) by mouth as directed. TAKE THIS PM 1  tablet 0  . clopidogrel (PLAVIX) 75 MG tablet Take 75 mg by mouth daily.    . Coenzyme Q10 (COQ10) 100 MG CAPS Take 100 mg by mouth daily.    Marland Kitchen diltiazem (CARDIZEM CD) 360 MG 24 hr capsule Take 1 capsule (360 mg total) by mouth daily. 90 capsule 3  . diphenhydramine-acetaminophen (TYLENOL PM) 25-500 MG TABS tablet Take 2 tablets by mouth at bedtime.    . Evolocumab (REPATHA SURECLICK) 176 MG/ML SOAJ Inject 140 mg into the skin every 14 (fourteen) days. 2 mL 11  . ezetimibe-simvastatin (VYTORIN) 10-80 MG tablet Take 1 tablet by mouth daily.     Marland Kitchen FARXIGA 10 MG TABS tablet Take 10 mg by mouth daily.     . hydrochlorothiazide (HYDRODIURIL) 25 MG tablet Take 1 tablet (25 mg total) by mouth daily. 90 tablet 3  . icosapent Ethyl (  VASCEPA) 1 g capsule Take 2 capsules (2 g total) by mouth 2 (two) times daily. 360 capsule 3  . insulin aspart protamine- aspart (NOVOLOG MIX 70/30) (70-30) 100 UNIT/ML injection Inject 20 Units into the skin in the morning, at noon, and at bedtime.     . Insulin Glargine (BASAGLAR KWIKPEN) 100 UNIT/ML SOPN Inject 40 Units into the skin at bedtime.     . isosorbide mononitrate (IMDUR) 30 MG 24 hr tablet Take 1 tablet (30 mg total) by mouth every evening. 90 tablet 0  . isosorbide mononitrate (IMDUR) 60 MG 24 hr tablet Take 1 tablet (60 mg total) by mouth daily. 90 tablet 3  . JANUVIA 100 MG tablet Take 100 mg by mouth daily.     . metFORMIN (GLUCOPHAGE) 500 MG tablet Take 500 mg by mouth 2 (two) times daily.    . metoprolol succinate (TOPROL-XL) 100 MG 24 hr tablet Take 1 tablet (100 mg total) by mouth 2 (two) times daily. Take with or immediately following a meal. 60 tablet 1  . nitroGLYCERIN (NITROSTAT) 0.4 MG SL tablet Place 1 tablet (0.4 mg total) under the tongue every 5 (five) minutes as needed for chest pain. 15 tablet 0  . pantoprazole (PROTONIX) 40 MG tablet Take 40 mg by mouth daily.    . ramipril (ALTACE) 10 MG capsule Take 10 mg by mouth daily.    . ranolazine  (RANEXA) 500 MG 12 hr tablet Take 1 tablet (500 mg total) by mouth 2 (two) times daily. 180 tablet 0  . rosuvastatin (CRESTOR) 40 MG tablet Take 1 tablet (40 mg total) by mouth at bedtime. 90 tablet 0   Social History   Socioeconomic History  . Marital status: Married    Spouse name: Not on file  . Number of children: 3  . Years of education: Not on file  . Highest education level: Not on file  Occupational History  . Occupation: Programmer, systems: [employer]  Tobacco Use  . Smoking status: Never Smoker  . Smokeless tobacco: Never Used  Vaping Use  . Vaping Use: Never used  Substance and Sexual Activity  . Alcohol use: No  . Drug use: No  . Sexual activity: Yes    Birth control/protection: None  Other Topics Concern  . Not on file  Social History Narrative  . Not on file   Social Determinants of Health   Financial Resource Strain: Low Risk   . Difficulty of Paying Living Expenses: Not hard at all  Food Insecurity: No Food Insecurity  . Worried About Charity fundraiser in the Last Year: Never true  . Ran Out of Food in the Last Year: Never true  Transportation Needs: No Transportation Needs  . Lack of Transportation (Medical): No  . Lack of Transportation (Non-Medical): No  Physical Activity: Inactive  . Days of Exercise per Week: 0 days  . Minutes of Exercise per Session: 0 min  Stress: No Stress Concern Present  . Feeling of Stress : Not at all  Social Connections: Moderately Integrated  . Frequency of Communication with Friends and Family: More than three times a week  . Frequency of Social Gatherings with Friends and Family: Three times a week  . Attends Religious Services: Never  . Active Member of Clubs or Organizations: No  . Attends Archivist Meetings: 1 to 4 times per year  . Marital Status: Married  Human resources officer Violence: Not At Risk  . Fear  of Current or Ex-Partner: No  . Emotionally Abused: No  . Physically Abused:  No  . Sexually Abused: No    Family History  Problem Relation Age of Onset  . Hypertension Father   . Diabetes Father   . Heart disease Father   . Diabetes Sister   . Hypertension Sister   . Hypertension Brother   . Diabetes Brother   . Diabetes Sister   . Diabetes Brother   . Heart disease Brother   . Lymphoma Brother   . Hypertension Son      Review of Systems: [y] = yes, [ ]  = no       General: Weight gain [] ; Weight loss [ ] ; Anorexia [ ] ; Fatigue [ ] ; Fever [ ] ; Chills [ ] ; Weakness [ ]     Cardiac: As reported in HPI  Pulmonary: As reported in HPI   GI: As reported in HPI   GU: Hematuria[ ] ; Dysuria [ ] ; Nocturia[ ]   Vascular: Pain in legs with walking [ ] ; Pain in feet with lying flat [ ] ; Non-healing sores [ ] ; Stroke [ ] ; TIA [ ] ; Slurred speech [ ] ;    Neuro: Headaches[ ] ; Vertigo[ ] ; Seizures[ ] ; Paresthesias[ ] ;Blurred vision [ ] ; Diplopia [ ] ; Vision changes [ ]     Ortho/Skin: Arthritis [ ] ; Joint pain [ ] ; Muscle pain [ ] ; Joint swelling [ ] ; Back Pain [ ] ; Rash [ ]     Psych: Depression[ ] ; Anxiety[ ]     Heme: Bleeding problems [ ] ; Clotting disorders [ ] ; Anemia [ ]     Endocrine: Diabetes [ ] ; Thyroid dysfunction[ ]   Physical Exam: Blood pressure 127/65, pulse (!) 114, temperature 98.5 F (36.9 C), temperature source Oral, resp. rate (!) 34, height 5\' 4"  (1.626 m), weight 80.3 kg, SpO2 95 %.   GENERAL: Patient is afebrile, Vital signs reviewed, Appears unwell and uncomfortable, diaphoretic. Alert and lucid. Satting 95% on 6L nasal cannula HEENT:  normocephalic, atraumatic NECK:  JVP normal CARD:  Tachy, regular rhythm, heart sounds normal. RESP:  Mild respiratory distress, tachypneic, decreased breath sounds in the bilateral bases (R worse than L) ABD: soft, mildly tender to palpation without rebound or guarding, normoactive bowel sounds. MUSC:  normal ROM, non-tender, WWP. 1+ ankle/pedal edema . SKIN: color normal, no rash, warm, dry . NEURO: No  focal deficits PSYCH: mood/affect normal.   Labs: Lab Results  Component Value Date   BUN 24 (H) 10/19/2020   Lab Results  Component Value Date   CREATININE 1.30 (H) 10/19/2020   Lab Results  Component Value Date   NA 133 (L) 10/19/2020   K 4.3 10/19/2020   CL 97 (L) 10/19/2020   CO2 19 (L) 10/19/2020   Lab Results  Component Value Date   TROPONINI <0.30 01/10/2013   Lab Results  Component Value Date   WBC 18.1 (H) 10/19/2020   HGB 11.2 (L) 10/19/2020   HCT 36.0 (L) 10/19/2020   MCV 82.0 10/19/2020   PLT 349 10/19/2020   Lab Results  Component Value Date   CHOL 120 10/19/2020   HDL 21 (L) 10/19/2020   LDLCALC 52 10/19/2020   LDLDIRECT 141.4 (H) 09/14/2019   TRIG 234 (H) 10/19/2020   CHOLHDL 5.7 10/19/2020   Lab Results  Component Value Date   ALT 25 10/19/2020   AST 24 10/19/2020   ALKPHOS 118 10/19/2020   BILITOT 1.2 10/19/2020      Radiology:   CXR IMPRESSION: Interval development of bibasilar pulmonary  infiltrate, more severe within the right lung base, infection versus aspiration.  EKG:  Sinus tachycardia, Incomplete RBBB and LAFB, ST depression noted in inferolateral leads, prolonged QTc   ASSESSMENT AND PLAN:    Ian [name] is a 64 year old gentleman with a history of CAD s/p CABG (2008) c/b several STEMIs (08/2019 s/p DES x2 to L radial-PDA with DES x 2 to protected LM into LCx; repeat STEMI in 12/2019 s/p DES to L radial-PDA), ICM (EF 40-45%), DMII (A1c 13.2%), GERD, HTN, HLD, PAD s/p R fempop bypass (02/2020), paroxysmal AF (on DOAC), and metastatic colon cancer (newly found hepatic and pulmonary mets with history of prior mets to liver s/p partial colectomy andchemotherapy) who presents to the ED for evaluation of progressive chest pain and shortness of breath.    # Chest Pain # NSTEMI # Complex CAD with Progressive Angina # Ischemic Cardiomyopathy # PAD - Plan for diagnostic LHC in AM  - Patient to be NPO except for meds - Cont heparin  IV infusion protocol - Symptom management with Nitro gtt - Trend trop and serial ECGs - Continued aggressive risk factor modification with glycemic control and GDMT when stable clinically   # SOB  # Bilateral pulmonary infiltrates # Hypoxia # Concern for aspiration vs infectious pneumonia # Leukocytosis :: Significant interval development of bilateral infiltrates on chest x-ray coupled with several days of nausea and dry heaving, as well as GI malignancy makes me more suspicious of potential aspiration event (pneumonitis vs pneumonia). Procalcitonin is suprisingly normal. Infiltrates could also represent acute heart failure exacerbation. Lower on the differential is PE. - Given his leukocytosis and sepsis physiology, will start empiric antibiotics with Vanc/Zosyn - Will obtain blood cultures x2 - Trial 40mg  IV Lasix and monitor strict intake and output and daily weights - If cultures remain negative, can downtitrate antibiotics for treatment of CAP vs aspiration pneumonia - Obtain CTA to evaluate for PE and better evaluate lung parenchyma  # Sinus Tachycardia  :: Unclear whether this is due to hypovolemia/sepsis physiology vs respiratory distress vs anxiety vs possible PE.  Less likely to be withdrawal related (no history of alcohol or benzo abuse).  Low suspicion for PE, but would be remiss not to check given his active malignancy - CTA to evaluate for PE -Monitor on telemetry -We are diuresing currently given his persistent shortness of breath, but low threshold to change direction if he were to become more hypotensive or display signs of hypovolemia that would require IV fluid resuscitation  # AKI - Watch in response to diuresis and contrast loads  - Cont to trend for now  # DMII - Dose reduce home insulin regimen given critical illness and NPO status - Hypoglycemia protocol  # HTN -Hold all antihypertensives in the acute setting -Will monitor closely reintroduce as  appropriate  # HLD -Continue PTA statin  # Paroxymsal AF -Monitor on telemetry -Hold DOAC -Heparin GTT as above  # Colon Cancer with Pulmonary and Hepatic Mets -Continue to monitor clinically -No active interventions planned during hospitalization   Signed: Clois Dupes 10/20/2020, 1:13 AM

## 2020-10-20 NOTE — Consult Note (Addendum)
[facility] Medical Consultation  Ian [name] JAS:[id number] DOB: [birth year] DOA: 10/19/2020 PCP: Ian Sites, MD   Requesting physician: Ian Grooms, MD Date of consultation: 10/20/2020 Reason for consultation: Assistance with medical management of diabetes  Impression/Recommendations Active Problems:   Unstable angina ([place])    1. NSTEMI/CAD/ischemic cardiomyopathy: Patient currently chest pain-free.  Noted to have elevated troponins up to 5929.  Left heart cath currently on hold due to pneumonia, uncontrolled hyperglycemia, and acute kidney injury. -Per cardiology  2.  Respiratory failure with hypoxia with sepsis secondary to pneumonia:Acute. Patient noted to have complaints of cough and shortness of breath.  Seen to be tachycardic and tachypneic with white blood cell count of 19.4.  Lactic acids were reassuring, but patient require high flow nasal cannula oxygen to maintain O2 saturations..  Seen to have a right middle lobe infiltrate on CT scan of the chest. -Continuous pulse oximetry with oxygen as needed to maintain O2 saturation greater than 92% -Flutter valve and incentive spirometry -Follow-up blood cultures -Check procalcitonin -Change Unasyn to Rocephin and azithromycin day 1 of 5  3. Paroxysmal atrial fibrillation: Eliquis was held and patient has been on heparin drip.   -Per cardiology  4. DKA, type II, uncontrolled: Last hemoglobin A1c on 10/19/2020 was 13.2.  Patient noted to have uncontrolled blood sugars up into the 500 with elevated anion gap of 23 and CO2 15.  Hyperglycemia had been initially attempted to be treated with long and short acting insulin.  However, it appears patient is in DKA  - Hyperglycemia order set initiated - Stopped subcu insulin orders - Started insulin drip and IV fluids per protocol  - Serial BMPs every 4 hours - Check beta hydroxybutyrate acid every 8 hours - Correct electrolytes as needed - Monitoring for AG closure and  will transition to subcutaneous insulin once able - Diabetes education consulted  5. Metastatic colon cancer to liver: Patient with history of sigmoid colon cancer status post resection and chemotherapy in 2019.  Recently found to have liver masses currently followed by oncology.   6. Acute kidney injury: Improving.  Creatinine 1.62->1.7->1.42 with IV fluid hydration. -Continue to monitor kidney function   I will followup again tomorrow. Please contact me if I can be of assistance in the meanwhile. Thank you for this consultation.  Chief Complaint: Chest pain  HPI:  Ian [name] is a 64 y.o. male with medical history significant of CAD s/p bypass grafting and angioplasty, uncontrolleddiabetes type 2 , hypertension, paroxysmal atrial fibrillation, dyslipidemia, peripheral vascular disease s/p above-the-knee popliteal bypass, colon cancer in 2019 with new liver metastases found in 09/2020, and GERD presented initially with complaints of chest pain and tightness.  Patient had been back and forth to the emergency department and cardiology clinic.  Cardiology had been in the process of setting patient up for outpatient cath, but patient symptoms persisted to the point in which to presented to the emergency department on 12/1.  Reported having associated symptoms of shortness of breath, cough, malaise, and nausea.  Denied having any significant fevers.  EKG showed was noted to show ST depressions.  Chest x-ray showed concern for a possible pneumonia.  Initially started on vancomycin and cefepime, but switched over to Unasyn.  Home cardiology formally admitted the patient. Troponins have trended  up 51->1386->4619->5929.  Patient was placed on heparin drip.  Blood sugars have elevated up into the 500s with anion gap of 23.  Patient reports at home that he is having difficulty controlling blood  sugars with everything that have been going on here lately. He is followed in outpatient setting by Ian [name] of  endocrinology.  Review of Systems: Review of Systems  Constitutional: Positive for malaise/fatigue.  Respiratory: Positive for cough and shortness of breath.   Cardiovascular: Positive for leg swelling. Negative for chest pain.  Gastrointestinal: Positive for abdominal pain and nausea.     Past Medical History:  Diagnosis Date  . 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 Colon cancer ([place])   . Complication of anesthesia    atypical pseudo-cholinesterase deficiency  . Coronary artery disease 01/10/2013   s/p CABG 2008 // s/p STEMI in 08/2019>>DES x 2 to L radial-PDA; staged PCI: DES x 2 to protected LM into LCx // s/p inf-lat STEMI 12/2019 >> DES to L Radial-PDA  . Diabetes mellitus   . Family Hx of adverse reaction to anesthesia    sister also has atypical pseudo-cholinesterase deficiency  . Gastroesophageal reflux disease   . History of heart bypass surgery   . HLD (hyperlipidemia)   . Hypertension   . Ischemic cardiomyopathy    Echo 01/04/2020: EF 45-50, mild LVH, Gr 2 DD, normal RVSF, mild to mod LAE, mild MR, mild AS (mean gradient 6 mmHg)  . Mild aortic stenosis   . Mild mitral regurgitation   . Myocardial infarction ([place])   . PAD (peripheral artery disease) (HCC)    s/p R fem-pop bypass 02/2020 (Dr. Donnetta Hutching)  . Paroxysmal atrial fibrillation (H[facility])    CHADS-VASc=3 // noted post op after fem pop bypass in 02/2020; due to high risk, Apixaban started   Past Surgical History:  Procedure Laterality Date  . ABDOMINAL AORTOGRAM W/LOWER EXTREMITY Bilateral 02/18/2020   Procedure: ABDOMINAL AORTOGRAM W/LOWER EXTREMITY;  Surgeon: Marty Heck, MD;  Location: [place] [facility];  Service: Cardiovascular;  Laterality: Bilateral;  . COLONOSCOPY N/A 08/29/2018   Procedure: COLONOSCOPY;  Surgeon: Daneil Dolin, MD;  [facility];  Service: Endoscopy;  Laterality: N/A;  9:30  . CORONARY ARTERY BYPASS GRAFT     4 vessels  . CORONARY ATHERECTOMY N/A 09/15/2019   Procedure:  CORONARY ATHERECTOMY;  Surgeon: Sherren Mocha, MD;  Location: [place] [facility];  Service: Cardiovascular;  LM-LCx Atherectomy - DES PCI (*Protected LM)   . CORONARY STENT INTERVENTION N/A 09/15/2019   Procedure: CORONARY STENT INTERVENTION;  Surgeon: Sherren Mocha, MD;  Location: [facility] [place] [facility];; Successful orbital atherectomy, PTCA, and overlapping DES treatment of severe calcific stenosis in the left main and left circumflex using a 2.5x34 mm Resolute Onyx DES (left circumflex) and 3.0x30 mm Resolute Onyx DES (overlapped proximally in the LCx extending into left main)  . CORONARY STENT INTERVENTION N/A 01/04/2020   Procedure: CORONARY STENT INTERVENTION;  Surgeon: Sherren Mocha, MD;  Location: [place] [facility];  Service: Cardiovascular;  Laterality: N/A;  . CORONARY/GRAFT ACUTE MI REVASCULARIZATION N/A 09/14/2019   Procedure: Coronary/Graft Acute MI Revascularization;  Surgeon: Sherren Mocha, MD;  Location: [place] [facility];  Service: Cardiovascular;  Laterality: N/A;  . FEMORAL-POPLITEAL BYPASS GRAFT Right 02/25/2020   Procedure: FEMORAL-ABOVE KNEE POPLITEAL ARTERY BYPASS GRAFT with Saphenous vein;  Surgeon: Rosetta Posner, MD;  Location: [facility];  Service: Vascular;  Laterality: Right;  . heart bypass    . INSERTION OF MESH  02/07/2015   Procedure: INSERTION OF MESH;  Surgeon: Aviva Signs Md, MD;  Location: [facility];  Service: General;;  . INTRAVASCULAR ULTRASOUND/IVUS N/A 01/04/2020   Procedure: Intravascular Ultrasound/IVUS;  Surgeon: Burt Knack,  Legrand Como, MD;  Location: [place] [facility];  Service: Cardiovascular;  Laterality: N/A;  . LEFT HEART CATH AND CORONARY ANGIOGRAPHY N/A 09/14/2019   Procedure: LEFT HEART CATH AND CORONARY ANGIOGRAPHY;  Surgeon: Sherren Mocha, MD;  Location: [place] [facility];  Service: Cardiovascular;  Laterality: N/A;  . LEFT HEART CATH AND CORONARY ANGIOGRAPHY N/A 01/04/2020   Procedure: LEFT HEART CATH AND CORONARY ANGIOGRAPHY;  Surgeon: Sherren Mocha,  MD;  Location: [place] [facility];  Service: Cardiovascular;  Laterality: N/A;  . PARTIAL COLECTOMY N/A 09/17/2018   Procedure: PARTIAL COLECTOMY WITH PARTIAL WEDGE RESECTION LIVER METASTASIS;  Surgeon: Aviva Signs, MD;  Location: [facility];  Service: General;  Laterality: N/A;  . POLYPECTOMY  08/29/2018   Procedure: POLYPECTOMY;  Surgeon: Daneil Dolin, MD;  [facility];  Service: Endoscopy;;  . PORTACATH PLACEMENT Left 10/10/2018   Procedure: INSERTION PORT-A-CATH (catheter in left subclavian);  Surgeon: Aviva Signs, MD;  Location: [facility];  Service: General;  Laterality: Left;  . UMBILICAL HERNIA REPAIR N/A 02/07/2015   Procedure: UMBILICAL HERNIORRHAPHY WITH MESH;  Surgeon: Aviva Signs Md, MD;  Location: [facility];  Service: General;  Laterality: N/A;   Social History:  reports that he has never smoked. He has never used smokeless tobacco. He reports that he does not drink alcohol and does not use drugs.  Allergies  Allergen Reactions  . Anectine [Succinylcholine] Other (See Comments)    Atypical pseudocholinesterase deficiency.   . Nsaids Other (See Comments)    Contraindicated (can only have asa 81mg )   Family History  Problem Relation Age of Onset  . Hypertension Father   . Diabetes Father   . Heart disease Father   . Diabetes Sister   . Hypertension Sister   . Hypertension Brother   . Diabetes Brother   . Diabetes Sister   . Diabetes Brother   . Heart disease Brother   . Lymphoma Brother   . Hypertension Son     Prior to Admission medications   Medication Sig Start Date End Date Taking? Authorizing Provider  apixaban (ELIQUIS) 5 MG TABS tablet Take 1 tablet (5 mg total) by mouth 2 (two) times daily. 05/31/20   Sherren Mocha, MD  clopidogrel (PLAVIX) 300 MG TABS tablet Take 1 tablet (300 mg total) by mouth as directed. TAKE THIS PM 10/19/20   Kathyrn Drown D, NP  clopidogrel (PLAVIX) 75 MG tablet Take 75 mg by mouth daily. 10/06/20   [provider]   Coenzyme Q10 (COQ10) 100 MG CAPS Take 100 mg by mouth daily.    [provider]  diltiazem (CARDIZEM CD) 360 MG 24 hr capsule Take 1 capsule (360 mg total) by mouth daily. 04/01/20 03/27/21  Sherren Mocha, MD  diphenhydramine-acetaminophen (TYLENOL PM) 25-500 MG TABS tablet Take 2 tablets by mouth at bedtime.    [provider]  Evolocumab (REPATHA SURECLICK) 938 MG/ML SOAJ Inject 140 mg into the skin every 14 (fourteen) days. 08/02/20   Sherren Mocha, MD  ezetimibe-simvastatin (VYTORIN) 10-80 MG tablet Take 1 tablet by mouth daily.  09/03/20   [provider]  FARXIGA 10 MG TABS tablet Take 10 mg by mouth daily.  12/19/18   [provider]  hydrochlorothiazide (HYDRODIURIL) 25 MG tablet Take 1 tablet (25 mg total) by mouth daily. 09/24/19   Bhagat, Crista Luria, PA  icosapent Ethyl (VASCEPA) 1 g capsule Take 2 capsules (2 g total) by mouth 2 (two) times daily. 09/27/20   Liliane Shi, PA-C  insulin aspart protamine- aspart (NOVOLOG MIX 70/30) (70-30) 100 UNIT/ML injection Inject 20 Units into the skin in the morning, at noon, and at bedtime.     [provider]  Insulin Glargine (BASAGLAR KWIKPEN) 100 UNIT/ML SOPN Inject 40 Units into the skin at bedtime.  12/10/18   [provider]  isosorbide mononitrate (IMDUR) 30 MG 24 hr tablet Take 1 tablet (30 mg total) by mouth every evening. 10/01/20 12/30/20  [place] Donnamarie Poag, DO  isosorbide mononitrate (IMDUR) 60 MG 24 hr tablet Take 1 tablet (60 mg total) by mouth daily. 02/09/20   Kathlen Mody, Scott T, PA-C  JANUVIA 100 MG tablet Take 100 mg by mouth daily.  12/19/18   [provider]  metFORMIN (GLUCOPHAGE) 500 MG tablet Take 500 mg by mouth 2 (two) times daily. 08/27/19   [provider]  metoprolol succinate (TOPROL-XL) 100 MG 24 hr tablet Take 1 tablet (100 mg total) by mouth 2 (two) times daily. Take with or immediately following a meal. 09/16/19   Cheryln Manly, NP  nitroGLYCERIN  (NITROSTAT) 0.4 MG SL tablet Place 1 tablet (0.4 mg total) under the tongue every 5 (five) minutes as needed for chest pain. 10/18/20   Larene Pickett, PA-C  pantoprazole (PROTONIX) 40 MG tablet Take 40 mg by mouth daily.    [provider]  ramipril (ALTACE) 10 MG capsule Take 10 mg by mouth daily.    [provider]  ranolazine (RANEXA) 500 MG 12 hr tablet Take 1 tablet (500 mg total) by mouth 2 (two) times daily. 10/01/20 12/30/20  [place] Donnamarie Poag, DO  rosuvastatin (CRESTOR) 40 MG tablet Take 1 tablet (40 mg total) by mouth at bedtime. 01/06/20   Cheryln Manly, NP   Physical Exam:  Constitutional: Elderly appearing male who appears to be in Vitals:   10/20/20 0808 10/20/20 0815 10/20/20 0830 10/20/20 0845  BP:  115/68 106/61 105/66  Pulse:  (!) 118 (!) 118 (!) 121  Resp:  (!) 28 (!) 35 (!) 34  Temp:      TempSrc:      SpO2: 96% 99% 97% 97%  Weight:      Height:       Eyes: PERRL, lids and conjunctivae normal ENMT: Mucous membranes are moist. Posterior pharynx clear of any exudate or lesions.  Neck: normal, supple, no masses, no thyromegaly Respiratory: Tachypneic with rales appreciated on the right lung base.  No significant wheezing at this time appreciated. Cardiovascular: Tachycardic.  Patient with leads +1 lower extremity edema. Abdomen: Bowel sounds appreciated.  Mild tenderness to palpation appreciated over the right upper quadrant. Musculoskeletal: no clubbing / cyanosis. No joint deformity upper and lower extremities. Good ROM, no contractures. Normal muscle tone.  Skin: no rashes, lesions, ulcers. No induration Neurologic: CN 2-12 grossly intact. Sensation intact, DTR normal. Strength 5/5 in all 4.  Psychiatric: Normal judgment and insight. Alert and oriented x 3. Normal mood.   Labs on Admission:  Basic Metabolic Panel: Recent Labs  Lab 10/18/20 0214 10/19/20 0940 10/19/20 2146 10/20/20 0324  NA 132* 135 133* 135  K 3.6 4.4 4.3 4.2  CL 97*  98 97* 97*  CO2 26 24 19* 15*  GLUCOSE 325* 425* 431* 511*  BUN 22 25 24* 28*  CREATININE 1.34* 0.88 1.30* 1.62*  CALCIUM 9.2 9.4 9.2 9.0   Liver Function Tests: Recent Labs  Lab 10/19/20 2146  AST 24  ALT 25  ALKPHOS 118  BILITOT 1.2  PROT 6.8  ALBUMIN 3.3*   No results for input(s): LIPASE, AMYLASE in the last 168 hours. No results for input(s): AMMONIA in the last 168 hours. CBC: Recent Labs  Lab 10/18/20 0214 10/19/20 0940 10/19/20 2146 10/20/20 0324  WBC 8.7 13.8* 18.1* 19.4*  NEUTROABS  --   --  15.0*  --   HGB 10.4* 10.0* 11.2* 10.9*  HCT 32.8* 33.0* 36.0* 33.5*  MCV 80.6 83 82.0 81.7  PLT 338 310 349 335   Cardiac Enzymes: No results for input(s): CKTOTAL, CKMB, CKMBINDEX, TROPONINI in the last 168 hours. BNP: Invalid input(s): POCBNP CBG: Recent Labs  Lab 10/20/20 0605 10/20/20 0607 10/20/20 0807  GLUCAP 551* 510* 510*    Radiological Exams on Admission: CT ANGIO CHEST PE W OR WO CONTRAST  Result Date: 10/20/2020 CLINICAL DATA:  Chest pain EXAM: CT ANGIOGRAPHY CHEST WITH CONTRAST TECHNIQUE: Multidetector CT imaging of the chest was performed using the standard protocol during bolus administration of intravenous contrast. Multiplanar CT image reconstructions and MIPs were obtained to evaluate the vascular anatomy. CONTRAST:  71mL OMNIPAQUE IOHEXOL 350 MG/ML SOLN COMPARISON:  10/06/2020 FINDINGS: Cardiovascular: There is excellent opacification of the pulmonary arterial tree. There is no intraluminal filling defect seen to suggest acute pulmonary embolism. Central pulmonary arteries are of normal caliber. Coronary artery bypass grafting has been performed. Mild left ventricular dilation. Global cardiac size is within normal limits. No pericardial effusion. Moderate atherosclerotic calcification is seen within the thoracic aorta. No aortic aneurysm. Left subclavian chest port tip is seen within the superior vena cava, unchanged. Mediastinum/Nodes: Asymmetric  enlargement of the right thyroid lobe with a 4 cm right thyroid nodule is stable since multiple prior examinations. This was noted to be metabolically inactive on prior PET CT examination of 10/30/2018. Interval development of mild shotty right paratracheal and right hilar adenopathy is likely reactive in nature. No frankly pathologic thoracic adenopathy. The esophagus is unremarkable. Lungs/Pleura: There has developed widespread nodular pulmonary infiltrate with dense consolidation within the right middle lobe most in keeping with multifocal pneumonic infiltrate. Previously noted pulmonary nodules are partially obscured by extensive pneumonic infiltrate. Dominant nodule within the left apex is again noted, compatible with metastatic disease. Mild left basilar atelectasis. Small right pleural effusion. No pneumothorax. No central obstructing lesion. Upper Abdomen: No acute abnormality. Musculoskeletal: No chest wall abnormality. No acute or significant osseous findings. Review of the MIP images confirms the above findings. IMPRESSION: Interval development of extensive multifocal pulmonary infiltrate and dense consolidation of the right middle lobe most in keeping with acute infection. Small associated right parapneumonic effusion. No central obstructing lesion. Multiple bilateral pulmonary nodules are identified, but partially obscured, in keeping with pulmonary metastatic disease. Mild left ventricular dilation. No pulmonary embolism. Aortic Atherosclerosis (ICD10-I70.0). Electronically Signed   By: Fidela Salisbury MD   On: 10/20/2020 04:59   DG CHEST PORT 1 VIEW  Result Date: 10/20/2020 CLINICAL DATA:  Chest pain. EXAM: PORTABLE CHEST 1 VIEW COMPARISON:  October 19, 2020. FINDINGS: Stable cardiomegaly. Status post coronary bypass graft. No pneumothorax is noted. Left subclavian Port-A-Cath is unchanged. Stable right basilar pneumonia or atelectasis is noted. Left lung is unremarkable. Bony thorax is  unremarkable. IMPRESSION: Stable right basilar pneumonia or atelectasis. Aortic Atherosclerosis (ICD10-I70.0). Electronically Signed   By: Marijo Conception M.D.   On: 10/20/2020 08:18   DG Chest Port 1 View  Result Date: 10/19/2020 CLINICAL DATA:  Chest pain, dyspnea EXAM: PORTABLE CHEST 1 VIEW COMPARISON:  10/18/2020 FINDINGS: The lungs are symmetrically expanded. Since  the prior examination, there has developed extensive right basilar pulmonary infiltrate, likely related to infection or aspiration. Patchy pulmonary infiltrate is also developed at the left lung base. Multiple pulmonary nodules are again identified within the left lung, better assessed on prior CT examination of 10/06/2020. No pneumothorax. Left subclavian chest port is seen with its tip within the superior vena cava. Coronary artery bypass grafting has been performed. Cardiac size within normal limits. No acute bone abnormality. IMPRESSION: Interval development of bibasilar pulmonary infiltrate, more severe within the right lung base, infection versus aspiration. Electronically Signed   By: Fidela Salisbury MD   On: 10/19/2020 22:50    EKG: Independently reviewed from 12/1.  Sinus tachycardia at 122 bpm  Time spent: >45 minutes  [facility] Pager [phone]  If 7PM-7AM, please contact night-coverage [email or web address] Password [facility] 10/20/2020, 10:23 AM

## 2020-10-20 NOTE — Progress Notes (Signed)
[place] for Heparin Indication: chest pain/ACS  Allergies  Allergen Reactions  . Anectine [Succinylcholine] Other (See Comments)    Atypical pseudocholinesterase deficiency  . Nsaids Other (See Comments)    Contraindicated (can only have asa 81 mg)    Patient Measurements: Height: 5\' 5"  (165.1 cm) Weight: 80.3 kg (177 lb) IBW/kg (Calculated) : 61.5 Heparin Dosing Weight: 75.9 kg  Vital Signs: Temp: 98 F (36.7 C) (12/02 1124) Temp Source: Oral (12/02 1124) BP: 111/63 (12/02 1600) Pulse Rate: 132 (12/02 1600)  Labs: Recent Labs    10/18/20 0214 10/19/20 0940 10/19/20 2146 10/19/20 2146 10/20/20 0121 10/20/20 0324 10/20/20 0608 10/20/20 1040 10/20/20 1451  HGB   < > 10.0* 11.2*  --   --  10.9*  --   --   --   HCT  --  33.0* 36.0*  --   --  33.5*  --   --   --   PLT  --  310 349  --   --  335  --   --   --   APTT  --   --  31  --   --  42*  --   --  >200*  LABPROT  --   --  16.6*  --   --   --   --   --   --   INR  --   --  1.4*  --   --   --   --   --   --   HEPARINUNFRC  --   --   --   --   --  1.74*  --   --  1.70*  CREATININE   < > 0.88 1.30*   < >  --  1.62*  --  1.70* 1.42*  TROPONINIHS   < >  --  51*   < > 1,386* 4,619* 5,929*  --   --    < > = values in this interval not displayed.    Estimated Creatinine Clearance: 52 mL/min (A) (by C-G formula based on SCr of 1.42 mg/dL (H)).   Medical History: Past Medical History:  Diagnosis Date  . 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 Colon cancer ([place])   . Complication of anesthesia    atypical pseudo-cholinesterase deficiency  . Coronary artery disease 01/10/2013   s/p CABG 2008 // s/p STEMI in 08/2019>>DES x 2 to L radial-PDA; staged PCI: DES x 2 to protected LM into LCx // s/p inf-lat STEMI 12/2019 >> DES to L Radial-PDA  . Diabetes mellitus   . Family Hx of adverse reaction to anesthesia    sister also has atypical pseudo-cholinesterase deficiency  .  Gastroesophageal reflux disease   . History of heart bypass surgery   . HLD (hyperlipidemia)   . Hypertension   . Ischemic cardiomyopathy    Echo 01/04/2020: EF 45-50, mild LVH, Gr 2 DD, normal RVSF, mild to mod LAE, mild MR, mild AS (mean gradient 6 mmHg)  . Mild aortic stenosis   . Mild mitral regurgitation   . Myocardial infarction ([place])   . PAD (peripheral artery disease) (H[facility])    s/p R fem-pop bypass 02/2020 (Dr. Donnetta Hutching)  . Paroxysmal atrial fibrillation ([facility])    CHADS-VASc=3 // noted post op after fem pop bypass in 02/2020; due to high risk, Apixaban started    Medications:  Scheduled:  . albuterol  2.5 mg Nebulization Q6H  . aspirin EC  81 mg Oral Daily  .  Chlorhexidine Gluconate Cloth  6 each Topical Daily  . clopidogrel  75 mg Oral Daily  . feeding supplement  237 mL Oral BID BM  . icosapent Ethyl  2 g Oral BID  . multivitamin with minerals  1 tablet Oral Daily  . pantoprazole  40 mg Oral Daily  . ramipril  10 mg Oral Daily  . ranolazine  500 mg Oral BID  . rosuvastatin  40 mg Oral QHS    Assessment: Patient is a 68 yom that presents to the ED with complaints of chest pain. Patient does have a slight bump in trop levels that is trending upward. The patient does take Apixaban PTA for afib.   Heparin level still elevated as expected with recent apixaban, aptt was low this morning but not >200s after modest rate adjustment. Discussed with RN, no bleeding noted, noted to be drawn from port with no heparin running concurrently. Will hold heparin for an hour and reduce rate back to 900/hr and recheck again later tonight.   Goal of Therapy:  Heparin level 0.3-0.7 units/mL aPTT 66-102 seconds Monitor platelets by anticoagulation protocol: Yes   Plan:  -Decrease heparin back to 900 units/hr after holding for an hour -2300 aPTT  Erin Hearing PharmD., BCPS Clinical Pharmacist 10/20/2020 5:34 PM

## 2020-10-20 NOTE — ED Notes (Signed)
RN took pt to CT , pt was not able to tolerate laying flat but CT was completed. Pt is now sating 88-91 on 6L, RT called to get either a HF [place] or venti mask. RN will continue to monitor

## 2020-10-20 NOTE — ED Notes (Signed)
Pt states he feels to congested to lay down for CT, pt educated on the importance and states he still needs some time before going to CT, RN will continue to monitor

## 2020-10-20 NOTE — Progress Notes (Signed)
Pharmacy Antibiotic Note  Ian [name] is a 64 y.o. male admitted on 10/19/2020 with pneumonia.  Pharmacy has been consulted for Vancomycin/Zosyn dosing. Also has chest pain on heparin. CXR with infection vs aspiration. WBC elevated.   Plan: Vancomycin 750 mg IV q12h Zosyn 3.375G IV q8h to be infused over 4 hours Trend WBC, temp, renal function  F/U infectious work-up Drug levels as indicated   Height: 5\' 4"  (162.6 cm) Weight: 80.3 kg (177 lb) IBW/kg (Calculated) : 59.2  Temp (24hrs), Avg:98.5 F (36.9 C), Min:98.5 F (36.9 C), Max:98.5 F (36.9 C)  Recent Labs  Lab 10/18/20 0214 10/19/20 0940 10/19/20 2146  WBC 8.7 13.8* 18.1*  CREATININE 1.34* 0.88 1.30*    Estimated Creatinine Clearance: 55.6 mL/min (A) (by C-G formula based on SCr of 1.3 mg/dL (H)).    Allergies  Allergen Reactions  . Anectine [Succinylcholine] Other (See Comments)    Atypical pseudocholinesterase deficiency.   . Nsaids Other (See Comments)    Contraindicated (can only have asa 81mg )   Narda Bonds, PharmD, BCPS Clinical Pharmacist Phone: [phone]

## 2020-10-20 NOTE — ED Notes (Signed)
RN called 2H, floor RN will call RN back in 10 minutes

## 2020-10-20 NOTE — Progress Notes (Signed)
Initial Nutrition Assessment  DOCUMENTATION CODES:   Non-severe (moderate) malnutrition in context of chronic illness  INTERVENTION:   Advance diet  Ensure Enlive po BID, each supplement provides 350 kcal and 20 grams of protein  MVI with minerals    NUTRITION DIAGNOSIS:   Moderate Malnutrition related to chronic illness (colon cancer, CAD s/p CABG) as evidenced by mild fat depletion, mild muscle depletion, severe muscle depletion.   GOAL:   Patient will meet greater than or equal to 90% of their needs   MONITOR:   PO intake, Supplement acceptance, Labs, Weight trends  REASON FOR ASSESSMENT:   Malnutrition Screening Tool    ASSESSMENT:   Ian [name] is a 64 y.o. male with PMH of severe CAD s/p CABG (2008) c/b several STEMIs (10/20 and 02/21), T2DM, GERD, HTN, HLD, metastatic colon cancer.Undergoing chemo and had prior partial colectomy. Admitted for worsening chest pain, SOB, and PNA concerns.  Spoke with pt and son at bedside. Pt stated his appetite has been decreased over the past few months. Pt stated he was eating mostly soup PTA, which during this admission says soup/broth make him feel nauseous. Has not had much off of clear liquid tray, but willing to try regular consistency foods. Pt states he has been eating smaller portion sizes and experiences early satiety. Pt's typical intake at home: Breakfast: cereal (Cherrios) and milk, Lunch: Jordan packet and crackers, Dinner: beef stew, mashed potatoes, and green beans.   Pt reports a gradual unintended weight loss of about 20 lbs over the past year. Reports his UBW is 200 lbs but estimates it has been a year since he was last at this weight. Per chart review, pt has experienced a 8 lb weight loss since April, but further weight loss could be masked by edema.    Labs reviewed: CBG's: 443-551, Chloride: 95, Sodium 131  Medications reviewed and include: protonix, insulin drip, D5 at 75 mL/hr, NS at 50  ml/hr   NUTRITION - FOCUSED PHYSICAL EXAM:    Most Recent Value  Orbital Region No depletion  Upper Arm Region Moderate depletion  Thoracic and Lumbar Region No depletion  Buccal Region Mild depletion  Temple Region Mild depletion  Clavicle Bone Region Mild depletion  Clavicle and Acromion Bone Region Mild depletion  Scapular Bone Region Mild depletion  Dorsal Hand No depletion  Patellar Region Moderate depletion  Anterior Thigh Region Severe depletion  Posterior Calf Region Severe depletion  Edema (RD Assessment) Moderate  [RLE]  Hair Reviewed  Eyes Reviewed  Mouth Unable to assess  Skin Reviewed  Nails Reviewed       Diet Order:   Diet Order            Diet clear liquid Room service appropriate? Yes; Fluid consistency: Thin  Diet effective now                 EDUCATION NEEDS:   Education needs have been addressed  Skin:  Skin Assessment: Reviewed RN Assessment  Last BM:  10/19/2020  Height:   Ht Readings from Last 1 Encounters:  10/20/20 5\' 5"  (1.651 m)    Weight:   Wt Readings from Last 1 Encounters:  10/19/20 80.3 kg    Ideal Body Weight:  59 kg  BMI:  Body mass index is 29.45 kg/m.  Estimated Nutritional Needs:   Kcal:  5726-2035  Protein:  130-140  Fluid:  >2 L/day    Ronnald Nian, Dietetic Intern Pager: [phone] If unavailable: [phone]

## 2020-10-20 NOTE — Progress Notes (Addendum)
Inpatient Diabetes Program Recommendations  AACE/ADA: New Consensus Statement on Inpatient Glycemic Control (2015)  Target Ranges:  Prepandial:   less than 140 mg/dL      Peak postprandial:   less than 180 mg/dL (1-2 hours)      Critically ill patients:  140 - 180 mg/dL   Lab Results  Component Value Date   GLUCAP 492 (H) 10/20/2020   HGBA1C 13.2 (H) 10/19/2020    Review of Glycemic Control Results for Ian [name], Ian [name] (MRN [id number]) as of 10/20/2020 12:01  Ref. Range 10/20/2020 02:31 10/20/2020 06:05 10/20/2020 06:07 10/20/2020 08:07 10/20/2020 11:22  Glucose-Capillary Latest Ref Range: 70 - 99 mg/dL 443 (H) 551 (HH) 510 (HH) 510 (HH) 492 (H)   Diabetes history: DM 2 Outpatient Diabetes medications: Farxiga 10 mg daily, Novolog 70/30 mix 20 units tid with meals, Basaglar 40 units q HS, Metformin 500 mg bid Current orders for Inpatient glycemic control:  IV insulin/ DKA order set  Inpatient Diabetes Program Recommendations:   Agree with addition of IV insulin/DKA order set.  CO2=17/Anion Gap=19 His total insulin daily insulin use at home was approximately 100 units daily.    Once acidosis cleared and patient ready to transition off insulin drip, consider adding Levemir 30 units bid as well as Novolog moderate q 4 hours. Will follow and speak to patient when appropriate.   Thanks,  Adah Perl, RN, BC-ADM Inpatient Diabetes Coordinator Pager [phone] (8a-5p)

## 2020-10-20 NOTE — Progress Notes (Signed)
CRITICAL VALUE ALERT  Critical Value:  CBG 510  Date & Time Notied:  10/20/20  Provider Notified: Harlan Stains, NP  Orders Received/Actions taken: See [facility]

## 2020-10-20 NOTE — Progress Notes (Addendum)
Progress Note  Patient Name: Ian [name] Date of Encounter: 10/20/2020  [facility] Cardiologist: Sherren Mocha, MD   Subjective   No chest pain.  Feels short of breath  Inpatient Medications    Scheduled Meds: . albuterol  2.5 mg Nebulization Q6H  . aspirin EC  81 mg Oral Daily  . clopidogrel  75 mg Oral Daily  . dapagliflozin propanediol  10 mg Oral Daily  . feeding supplement  237 mL Oral BID BM  . icosapent Ethyl  2 g Oral BID  . insulin aspart  0-15 Units Subcutaneous Q6H  . insulin glargine  30 Units Subcutaneous QHS  . pantoprazole  40 mg Oral Daily  . ramipril  10 mg Oral Daily  . ranolazine  500 mg Oral BID  . rosuvastatin  40 mg Oral QHS   Continuous Infusions: . sodium chloride Stopped (10/19/20 2328)  . sodium chloride Stopped (10/20/20 0641)  . heparin 1,100 Units/hr (10/20/20 0800)  . nitroGLYCERIN 10 mcg/min (10/20/20 0800)  . piperacillin-tazobactam (ZOSYN)  IV Stopped (10/20/20 0305)  . vancomycin Stopped (10/20/20 0305)   PRN Meds: acetaminophen, ondansetron (ZOFRAN) IV   Vital Signs    Vitals:   10/20/20 0715 10/20/20 0730 10/20/20 0800 10/20/20 0808  BP: 112/70 113/66 112/67   Pulse: (!) 116 (!) 115 (!) 118   Resp: (!) 38 (!) 31 (!) 33   Temp:      TempSrc:      SpO2: 97% 97% 97% 96%  Weight:      Height:        Intake/Output Summary (Last 24 hours) at 10/20/2020 0852 Last data filed at 10/20/2020 0800 Gross per 24 hour  Intake 355.33 ml  Output --  Net 355.33 ml   Last 3 Weights 10/19/2020 10/19/2020 10/18/2020  Weight (lbs) 177 lb 181 lb 9.6 oz 177 lb  Weight (kg) 80.287 kg 82.373 kg 80.287 kg      Telemetry    Sinus tach - Personally Reviewed  ECG    ST, inferolateral ST depressions - Personally Reviewed  Physical Exam   GEN: No acute distress.   Neck: No JVD Cardiac: RRR, no murmurs, rubs, or gallops.  Respiratory: Clear to auscultation bilaterally. GI: Soft, nontender, non-distended  MS: No edema; No  deformity. Neuro:  Nonfocal  Psych: Normal affect   Labs    High Sensitivity Troponin:   Recent Labs  Lab 10/18/20 0438 10/19/20 2146 10/20/20 0121 10/20/20 0324 10/20/20 0608  TROPONINIHS 44* 51* 1,386* 4,619* 5,929*      Chemistry Recent Labs  Lab 10/18/20 0214 10/18/20 0214 10/19/20 0940 10/19/20 2146 10/20/20 0324  NA 132*  --  135 133* 135  K 3.6   < > 4.4 4.3 4.2  CL 97*   < > 98 97* 97*  CO2 26   < > 24 19* 15*  GLUCOSE 325*  --  425* 431* 511*  BUN 22  --  25 24* 28*  CREATININE 1.34*   < > 0.88 1.30* 1.62*  CALCIUM 9.2   < > 9.4 9.2 9.0  PROT  --   --   --  6.8  --   ALBUMIN  --   --   --  3.3*  --   AST  --   --   --  24  --   ALT  --   --   --  25  --   ALKPHOS  --   --   --  118  --   BILITOT  --   --   --  1.2  --   GFRNONAA 60*  --  91 >60 47*  GFRAA  --   --  106  --   --   ANIONGAP 9  --   --  17* 23*   < > = values in this interval not displayed.     Hematology Recent Labs  Lab 10/19/20 0940 10/19/20 2146 10/20/20 0324  WBC 13.8* 18.1* 19.4*  RBC 3.98* 4.39 4.10*  HGB 10.0* 11.2* 10.9*  HCT 33.0* 36.0* 33.5*  MCV 83 82.0 81.7  MCH 25.1* 25.5* 26.6  MCHC 30.3* 31.1 32.5  RDW 16.7* 15.4 15.5  PLT 310 349 335    BNP Recent Labs  Lab 10/19/20 2146  BNP 688.7*     DDimer No results for input(s): DDIMER in the last 168 hours.   Radiology    CT ANGIO CHEST PE W OR WO CONTRAST  Result Date: 10/20/2020 CLINICAL DATA:  Chest pain EXAM: CT ANGIOGRAPHY CHEST WITH CONTRAST TECHNIQUE: Multidetector CT imaging of the chest was performed using the standard protocol during bolus administration of intravenous contrast. Multiplanar CT image reconstructions and MIPs were obtained to evaluate the vascular anatomy. CONTRAST:  53mL OMNIPAQUE IOHEXOL 350 MG/ML SOLN COMPARISON:  10/06/2020 FINDINGS: Cardiovascular: There is excellent opacification of the pulmonary arterial tree. There is no intraluminal filling defect seen to suggest acute pulmonary  embolism. Central pulmonary arteries are of normal caliber. Coronary artery bypass grafting has been performed. Mild left ventricular dilation. Global cardiac size is within normal limits. No pericardial effusion. Moderate atherosclerotic calcification is seen within the thoracic aorta. No aortic aneurysm. Left subclavian chest port tip is seen within the superior vena cava, unchanged. Mediastinum/Nodes: Asymmetric enlargement of the right thyroid lobe with a 4 cm right thyroid nodule is stable since multiple prior examinations. This was noted to be metabolically inactive on prior PET CT examination of 10/30/2018. Interval development of mild shotty right paratracheal and right hilar adenopathy is likely reactive in nature. No frankly pathologic thoracic adenopathy. The esophagus is unremarkable. Lungs/Pleura: There has developed widespread nodular pulmonary infiltrate with dense consolidation within the right middle lobe most in keeping with multifocal pneumonic infiltrate. Previously noted pulmonary nodules are partially obscured by extensive pneumonic infiltrate. Dominant nodule within the left apex is again noted, compatible with metastatic disease. Mild left basilar atelectasis. Small right pleural effusion. No pneumothorax. No central obstructing lesion. Upper Abdomen: No acute abnormality. Musculoskeletal: No chest wall abnormality. No acute or significant osseous findings. Review of the MIP images confirms the above findings. IMPRESSION: Interval development of extensive multifocal pulmonary infiltrate and dense consolidation of the right middle lobe most in keeping with acute infection. Small associated right parapneumonic effusion. No central obstructing lesion. Multiple bilateral pulmonary nodules are identified, but partially obscured, in keeping with pulmonary metastatic disease. Mild left ventricular dilation. No pulmonary embolism. Aortic Atherosclerosis (ICD10-I70.0). Electronically Signed   By:  Fidela Salisbury MD   On: 10/20/2020 04:59   DG CHEST PORT 1 VIEW  Result Date: 10/20/2020 CLINICAL DATA:  Chest pain. EXAM: PORTABLE CHEST 1 VIEW COMPARISON:  October 19, 2020. FINDINGS: Stable cardiomegaly. Status post coronary bypass graft. No pneumothorax is noted. Left subclavian Port-A-Cath is unchanged. Stable right basilar pneumonia or atelectasis is noted. Left lung is unremarkable. Bony thorax is unremarkable. IMPRESSION: Stable right basilar pneumonia or atelectasis. Aortic Atherosclerosis (ICD10-I70.0). Electronically Signed   By: Bobbe Medico.D.  On: 10/20/2020 08:18   DG Chest Port 1 View  Result Date: 10/19/2020 CLINICAL DATA:  Chest pain, dyspnea EXAM: PORTABLE CHEST 1 VIEW COMPARISON:  10/18/2020 FINDINGS: The lungs are symmetrically expanded. Since the prior examination, there has developed extensive right basilar pulmonary infiltrate, likely related to infection or aspiration. Patchy pulmonary infiltrate is also developed at the left lung base. Multiple pulmonary nodules are again identified within the left lung, better assessed on prior CT examination of 10/06/2020. No pneumothorax. Left subclavian chest port is seen with its tip within the superior vena cava. Coronary artery bypass grafting has been performed. Cardiac size within normal limits. No acute bone abnormality. IMPRESSION: Interval development of bibasilar pulmonary infiltrate, more severe within the right lung base, infection versus aspiration. Electronically Signed   By: Fidela Salisbury MD   On: 10/19/2020 22:50    Cardiac Studies   EF 40-45% by prior echo; new RLL infiltrate which was not present on prior CXR.  Patient Profile     64 y.o. male with severe CAD.  Pneumonia noted on CXR.  Also concern for metastatic cancer  Assessment & Plan    1) CAD: Was reporting angina over the past month.  Will hold off on cath today as his sx seem to be primarily from pneumonia.  Will get IM consult to help with antibiotic  management.  Elevated troponin , nSTEMI may be partly demand ischemia, although preceding history before pneumonia diagnosis is concerning.Marland Kitchen   2) DM:  IM consult to help with severely elevated blood sugars.    3) Metastatic colon cancer on CT scan- following with oncology.    4) Advance diet as tolerated today.  He had some nausea over the past few days.  5) ARF: Cr increased.  Will need to follow and make sure it is stable before cath. Aggressive hydration limited by his shortness of breath and oxygen requirement.   Multiple medical issues with this patient that will affect his long term prognosis.   For questions or updates, please contact [place] Please consult [email or web address] for contact info under        Signed, Larae Grooms, MD  10/20/2020, 8:52 AM

## 2020-10-20 NOTE — ED Notes (Addendum)
RN called MD to make aware of glucose, MD [place] states to give 20 units now of novolog

## 2020-10-21 ENCOUNTER — Inpatient Hospital Stay (HOSPITAL_COMMUNITY): Payer: Federal, State, Local not specified - PPO

## 2020-10-21 DIAGNOSIS — J9601 Acute respiratory failure with hypoxia: Secondary | ICD-10-CM

## 2020-10-21 DIAGNOSIS — E1159 Type 2 diabetes mellitus with other circulatory complications: Secondary | ICD-10-CM

## 2020-10-21 DIAGNOSIS — E440 Moderate protein-calorie malnutrition: Secondary | ICD-10-CM | POA: Diagnosis not present

## 2020-10-21 DIAGNOSIS — J810 Acute pulmonary edema: Secondary | ICD-10-CM

## 2020-10-21 DIAGNOSIS — I483 Typical atrial flutter: Secondary | ICD-10-CM

## 2020-10-21 DIAGNOSIS — I200 Unstable angina: Secondary | ICD-10-CM | POA: Diagnosis not present

## 2020-10-21 DIAGNOSIS — Z794 Long term (current) use of insulin: Secondary | ICD-10-CM

## 2020-10-21 LAB — GLUCOSE, CAPILLARY
Glucose-Capillary: 156 mg/dL — ABNORMAL HIGH (ref 70–99)
Glucose-Capillary: 166 mg/dL — ABNORMAL HIGH (ref 70–99)
Glucose-Capillary: 169 mg/dL — ABNORMAL HIGH (ref 70–99)
Glucose-Capillary: 252 mg/dL — ABNORMAL HIGH (ref 70–99)
Glucose-Capillary: 262 mg/dL — ABNORMAL HIGH (ref 70–99)
Glucose-Capillary: 278 mg/dL — ABNORMAL HIGH (ref 70–99)
Glucose-Capillary: 292 mg/dL — ABNORMAL HIGH (ref 70–99)
Glucose-Capillary: 336 mg/dL — ABNORMAL HIGH (ref 70–99)

## 2020-10-21 LAB — POCT I-STAT 7, (LYTES, BLD GAS, ICA,H+H)
Acid-Base Excess: 0 mmol/L (ref 0.0–2.0)
Bicarbonate: 23.4 mmol/L (ref 20.0–28.0)
Calcium, Ion: 1.11 mmol/L — ABNORMAL LOW (ref 1.15–1.40)
HCT: 29 % — ABNORMAL LOW (ref 39.0–52.0)
Hemoglobin: 9.9 g/dL — ABNORMAL LOW (ref 13.0–17.0)
O2 Saturation: 100 %
Patient temperature: 98.8
Potassium: 4.9 mmol/L (ref 3.5–5.1)
Sodium: 134 mmol/L — ABNORMAL LOW (ref 135–145)
TCO2: 24 mmol/L (ref 22–32)
pCO2 arterial: 33.3 mmHg (ref 32.0–48.0)
pH, Arterial: 7.456 — ABNORMAL HIGH (ref 7.350–7.450)
pO2, Arterial: 291 mmHg — ABNORMAL HIGH (ref 83.0–108.0)

## 2020-10-21 LAB — BETA-HYDROXYBUTYRIC ACID: Beta-Hydroxybutyric Acid: 0.14 mmol/L (ref 0.05–0.27)

## 2020-10-21 LAB — BASIC METABOLIC PANEL
Anion gap: 13 (ref 5–15)
BUN: 31 mg/dL — ABNORMAL HIGH (ref 8–23)
CO2: 22 mmol/L (ref 22–32)
Calcium: 8.3 mg/dL — ABNORMAL LOW (ref 8.9–10.3)
Chloride: 99 mmol/L (ref 98–111)
Creatinine, Ser: 1.31 mg/dL — ABNORMAL HIGH (ref 0.61–1.24)
GFR, Estimated: >60 mL/min (ref >60)
Glucose, Bld: 160 mg/dL — ABNORMAL HIGH (ref 70–99)
Potassium: 3.5 mmol/L (ref 3.5–5.1)
Sodium: 134 mmol/L — ABNORMAL LOW (ref 135–145)

## 2020-10-21 LAB — PHOSPHORUS: Phosphorus: 4.6 mg/dL (ref 2.5–4.6)

## 2020-10-21 LAB — CBC
HCT: 28.1 % — ABNORMAL LOW (ref 39.0–52.0)
Hemoglobin: 8.8 g/dL — ABNORMAL LOW (ref 13.0–17.0)
MCH: 25.7 pg — ABNORMAL LOW (ref 26.0–34.0)
MCHC: 31.3 g/dL (ref 30.0–36.0)
MCV: 82.2 fL (ref 80.0–100.0)
Platelets: 258 10*3/uL (ref 150–400)
RBC: 3.42 MIL/uL — ABNORMAL LOW (ref 4.22–5.81)
RDW: 15.8 % — ABNORMAL HIGH (ref 11.5–15.5)
WBC: 16.1 10*3/uL — ABNORMAL HIGH (ref 4.0–10.5)
nRBC: 0 % (ref 0.0–0.2)

## 2020-10-21 LAB — HEPARIN LEVEL (UNFRACTIONATED): Heparin Unfractionated: 0.99 IU/mL — ABNORMAL HIGH (ref 0.30–0.70)

## 2020-10-21 LAB — MAGNESIUM: Magnesium: 2.3 mg/dL (ref 1.7–2.4)

## 2020-10-21 LAB — APTT
aPTT: 53 seconds — ABNORMAL HIGH (ref 24–36)
aPTT: 83 seconds — ABNORMAL HIGH (ref 24–36)

## 2020-10-21 MED ORDER — ROCURONIUM BROMIDE 10 MG/ML (PF) SYRINGE
PREFILLED_SYRINGE | INTRAVENOUS | Status: AC
  Administered 2020-10-21: 50 mg
  Filled 2020-10-21: qty 10

## 2020-10-21 MED ORDER — ORAL CARE MOUTH RINSE
15.0000 mL | OROMUCOSAL | Status: DC
  Administered 2020-10-22 – 2020-10-29 (×71): 15 mL via OROMUCOSAL

## 2020-10-21 MED ORDER — METOPROLOL TARTRATE 25 MG/10 ML ORAL SUSPENSION
12.5000 mg | Freq: Three times a day (TID) | ORAL | Status: DC
  Administered 2020-10-21 – 2020-10-22 (×2): 12.5 mg
  Filled 2020-10-21 (×2): qty 5

## 2020-10-21 MED ORDER — PHENYLEPHRINE HCL-NACL 10-0.9 MG/250ML-% IV SOLN
INTRAVENOUS | Status: AC
  Administered 2020-10-21: 20 ug/min via INTRAVENOUS
  Filled 2020-10-21: qty 250

## 2020-10-21 MED ORDER — INSULIN DETEMIR 100 UNIT/ML ~~LOC~~ SOLN
40.0000 [IU] | Freq: Every day | SUBCUTANEOUS | Status: DC
  Administered 2020-10-21 (×2): 40 [IU] via SUBCUTANEOUS
  Filled 2020-10-21 (×3): qty 0.4

## 2020-10-21 MED ORDER — PHENYLEPHRINE 40 MCG/ML (10ML) SYRINGE FOR IV PUSH (FOR BLOOD PRESSURE SUPPORT): 80.0000 ug | PREFILLED_SYRINGE | Freq: Once | INTRAVENOUS | Status: AC | PRN

## 2020-10-21 MED ORDER — ETOMIDATE 2 MG/ML IV SOLN
INTRAVENOUS | Status: AC
  Administered 2020-10-21: 20 mg
  Filled 2020-10-21: qty 20

## 2020-10-21 MED ORDER — METOPROLOL TARTRATE 5 MG/5ML IV SOLN
5.0000 mg | Freq: Once | INTRAVENOUS | Status: AC
  Administered 2020-10-21: 5 mg via INTRAVENOUS
  Filled 2020-10-21: qty 5

## 2020-10-21 MED ORDER — POTASSIUM CHLORIDE CRYS ER 20 MEQ PO TBCR
40.0000 meq | EXTENDED_RELEASE_TABLET | Freq: Once | ORAL | Status: AC
  Administered 2020-10-21: 40 meq via ORAL
  Filled 2020-10-21: qty 2

## 2020-10-21 MED ORDER — AMIODARONE HCL IN DEXTROSE 360-4.14 MG/200ML-% IV SOLN
60.0000 mg/h | INTRAVENOUS | Status: DC
  Administered 2020-10-21: 60 mg/h via INTRAVENOUS
  Filled 2020-10-21 (×2): qty 200

## 2020-10-21 MED ORDER — POTASSIUM CHLORIDE CRYS ER 20 MEQ PO TBCR
40.0000 meq | EXTENDED_RELEASE_TABLET | Freq: Once | ORAL | Status: DC
  Filled 2020-10-21: qty 2

## 2020-10-21 MED ORDER — RAMIPRIL 10 MG PO CAPS
10.0000 mg | ORAL_CAPSULE | Freq: Every day | ORAL | Status: DC
  Administered 2020-10-22: 10 mg
  Filled 2020-10-21: qty 1

## 2020-10-21 MED ORDER — AMIODARONE LOAD VIA INFUSION
150.0000 mg | Freq: Once | INTRAVENOUS | Status: AC
  Administered 2020-10-21: 150 mg via INTRAVENOUS
  Filled 2020-10-21: qty 83.34

## 2020-10-21 MED ORDER — INSULIN ASPART 100 UNIT/ML ~~LOC~~ SOLN
0.0000 [IU] | Freq: Three times a day (TID) | SUBCUTANEOUS | Status: DC
  Administered 2020-10-21: 8 [IU] via SUBCUTANEOUS
  Administered 2020-10-21: 3 [IU] via SUBCUTANEOUS
  Administered 2020-10-21: 8 [IU] via SUBCUTANEOUS
  Administered 2020-10-21: 11 [IU] via SUBCUTANEOUS

## 2020-10-21 MED ORDER — LACTATED RINGERS IV BOLUS
1000.0000 mL | Freq: Once | INTRAVENOUS | Status: AC
  Administered 2020-10-21: 1000 mL via INTRAVENOUS

## 2020-10-21 MED ORDER — PANTOPRAZOLE SODIUM 40 MG PO PACK
40.0000 mg | PACK | Freq: Every day | ORAL | Status: DC
  Administered 2020-10-22 – 2020-10-28 (×7): 40 mg
  Filled 2020-10-21 (×7): qty 20

## 2020-10-21 MED ORDER — DEXMEDETOMIDINE HCL IN NACL 400 MCG/100ML IV SOLN
0.0000 ug/kg/h | INTRAVENOUS | Status: AC
  Administered 2020-10-21: 0.4 ug/kg/h via INTRAVENOUS
  Administered 2020-10-21: 0.8 ug/kg/h via INTRAVENOUS
  Administered 2020-10-22 (×2): 0.6 ug/kg/h via INTRAVENOUS
  Administered 2020-10-22: 0.8 ug/kg/h via INTRAVENOUS
  Administered 2020-10-23: 0.5 ug/kg/h via INTRAVENOUS
  Administered 2020-10-23 – 2020-10-24 (×3): 0.6 ug/kg/h via INTRAVENOUS
  Filled 2020-10-21 (×10): qty 100

## 2020-10-21 MED ORDER — NOREPINEPHRINE 4 MG/250ML-% IV SOLN
INTRAVENOUS | Status: AC
  Administered 2020-10-21: 10 ug/min via INTRAVENOUS
  Filled 2020-10-21: qty 250

## 2020-10-21 MED ORDER — PROSOURCE TF PO LIQD
90.0000 mL | Freq: Three times a day (TID) | ORAL | Status: DC
  Administered 2020-10-21 – 2020-10-28 (×23): 90 mL
  Filled 2020-10-21 (×23): qty 90

## 2020-10-21 MED ORDER — ADULT MULTIVITAMIN W/MINERALS CH
1.0000 | ORAL_TABLET | Freq: Every day | ORAL | Status: DC
  Administered 2020-10-22 – 2020-10-26 (×5): 1
  Filled 2020-10-21 (×5): qty 1

## 2020-10-21 MED ORDER — METOCLOPRAMIDE HCL 5 MG/ML IJ SOLN
10.0000 mg | Freq: Once | INTRAMUSCULAR | Status: AC
  Administered 2020-10-21: 10 mg via INTRAVENOUS
  Filled 2020-10-21: qty 2

## 2020-10-21 MED ORDER — NOREPINEPHRINE 4 MG/250ML-% IV SOLN
0.0000 ug/min | INTRAVENOUS | Status: DC
  Administered 2020-10-21: 4 ug/min via INTRAVENOUS
  Administered 2020-10-22: 10 ug/min via INTRAVENOUS
  Administered 2020-10-22: 8 ug/min via INTRAVENOUS
  Administered 2020-10-22: 6.5 ug/min via INTRAVENOUS
  Administered 2020-10-23: 8 ug/min via INTRAVENOUS
  Administered 2020-10-23: 10 ug/min via INTRAVENOUS
  Administered 2020-10-24: 5 ug/min via INTRAVENOUS
  Filled 2020-10-21 (×8): qty 250

## 2020-10-21 MED ORDER — LEVALBUTEROL HCL 0.63 MG/3ML IN NEBU
0.6300 mg | INHALATION_SOLUTION | Freq: Four times a day (QID) | RESPIRATORY_TRACT | Status: DC
  Administered 2020-10-21 – 2020-10-23 (×10): 0.63 mg via RESPIRATORY_TRACT
  Filled 2020-10-21 (×11): qty 3

## 2020-10-21 MED ORDER — POTASSIUM CHLORIDE 10 MEQ/50ML IV SOLN
10.0000 meq | INTRAVENOUS | Status: AC
  Administered 2020-10-21 (×4): 10 meq via INTRAVENOUS
  Filled 2020-10-21 (×5): qty 50

## 2020-10-21 MED ORDER — POLYETHYLENE GLYCOL 3350 17 G PO PACK
17.0000 g | PACK | Freq: Every day | ORAL | Status: DC
  Administered 2020-10-22 – 2020-10-26 (×3): 17 g
  Filled 2020-10-21 (×4): qty 1

## 2020-10-21 MED ORDER — MIDAZOLAM HCL 2 MG/2ML IJ SOLN
INTRAMUSCULAR | Status: AC
  Administered 2020-10-21: 2 mg
  Filled 2020-10-21: qty 4

## 2020-10-21 MED ORDER — AMIODARONE IV BOLUS ONLY 150 MG/100ML: 150.0000 mg | Freq: Once | INTRAVENOUS | Status: DC

## 2020-10-21 MED ORDER — INSULIN ASPART 100 UNIT/ML ~~LOC~~ SOLN
0.0000 [IU] | SUBCUTANEOUS | Status: DC
  Administered 2020-10-21 (×2): 5 [IU] via SUBCUTANEOUS
  Administered 2020-10-22: 7 [IU] via SUBCUTANEOUS

## 2020-10-21 MED ORDER — GUAIFENESIN 100 MG/5ML PO SOLN
20.0000 mL | Freq: Three times a day (TID) | ORAL | Status: DC
  Administered 2020-10-21 – 2020-10-29 (×24): 400 mg
  Filled 2020-10-21: qty 20
  Filled 2020-10-21: qty 15
  Filled 2020-10-21 (×2): qty 5
  Filled 2020-10-21: qty 20
  Filled 2020-10-21: qty 5
  Filled 2020-10-21 (×6): qty 20
  Filled 2020-10-21: qty 5
  Filled 2020-10-21 (×6): qty 20
  Filled 2020-10-21 (×2): qty 5
  Filled 2020-10-21: qty 20
  Filled 2020-10-21: qty 5
  Filled 2020-10-21 (×2): qty 20

## 2020-10-21 MED ORDER — PHENYLEPHRINE HCL-NACL 10-0.9 MG/250ML-% IV SOLN: 0.0000 ug/min | INTRAVENOUS | Status: DC

## 2020-10-21 MED ORDER — PHENYLEPHRINE 40 MCG/ML (10ML) SYRINGE FOR IV PUSH (FOR BLOOD PRESSURE SUPPORT)
PREFILLED_SYRINGE | INTRAVENOUS | Status: AC
  Administered 2020-10-21: 200 ug via INTRAVENOUS
  Filled 2020-10-21: qty 10

## 2020-10-21 MED ORDER — FUROSEMIDE 10 MG/ML IJ SOLN
60.0000 mg | Freq: Once | INTRAMUSCULAR | Status: AC
  Administered 2020-10-21: 60 mg via INTRAVENOUS
  Filled 2020-10-21: qty 6

## 2020-10-21 MED ORDER — FENTANYL CITRATE (PF) 100 MCG/2ML IJ SOLN
50.0000 ug | Freq: Once | INTRAMUSCULAR | Status: AC
  Filled 2020-10-21: qty 2

## 2020-10-21 MED ORDER — FENTANYL BOLUS VIA INFUSION
50.0000 ug | INTRAVENOUS | Status: DC | PRN
  Administered 2020-10-26: 25 ug via INTRAVENOUS
  Filled 2020-10-21: qty 50

## 2020-10-21 MED ORDER — CHLORHEXIDINE GLUCONATE 0.12% ORAL RINSE (MEDLINE KIT)
15.0000 mL | Freq: Two times a day (BID) | OROMUCOSAL | Status: DC
  Administered 2020-10-21 – 2020-10-31 (×17): 15 mL via OROMUCOSAL

## 2020-10-21 MED ORDER — AMIODARONE HCL IN DEXTROSE 360-4.14 MG/200ML-% IV SOLN
30.0000 mg/h | INTRAVENOUS | Status: DC
  Administered 2020-10-21 (×3): 60 mg/h via INTRAVENOUS
  Administered 2020-10-21 – 2020-10-22 (×3): 30 mg/h via INTRAVENOUS
  Administered 2020-10-23 (×2): 60 mg/h via INTRAVENOUS
  Administered 2020-10-23: 08:00:00 30 mg/h via INTRAVENOUS
  Administered 2020-10-24 – 2020-10-27 (×13): 60 mg/h via INTRAVENOUS
  Administered 2020-10-27 – 2020-10-30 (×7): 30 mg/h via INTRAVENOUS
  Filled 2020-10-21 (×28): qty 200

## 2020-10-21 MED ORDER — FENTANYL 2500MCG IN NS 250ML (10MCG/ML) PREMIX INFUSION
50.0000 ug/h | INTRAVENOUS | Status: DC
  Administered 2020-10-21 – 2020-10-26 (×3): 50 ug/h via INTRAVENOUS
  Administered 2020-10-27: 100 ug/h via INTRAVENOUS
  Administered 2020-10-28: 125 ug/h via INTRAVENOUS
  Filled 2020-10-21 (×5): qty 250

## 2020-10-21 MED ORDER — ASPIRIN 81 MG PO CHEW
81.0000 mg | CHEWABLE_TABLET | Freq: Every day | ORAL | Status: DC
  Administered 2020-10-22 – 2020-10-29 (×8): 81 mg
  Filled 2020-10-21 (×8): qty 1

## 2020-10-21 MED ORDER — SODIUM BICARBONATE 8.4 % IV SOLN
INTRAVENOUS | Status: AC
  Administered 2020-10-21: 50 meq
  Filled 2020-10-21: qty 50

## 2020-10-21 MED ORDER — METOPROLOL TARTRATE 12.5 MG HALF TABLET
12.5000 mg | ORAL_TABLET | Freq: Three times a day (TID) | ORAL | Status: DC
  Administered 2020-10-21: 12.5 mg via ORAL
  Filled 2020-10-21: qty 1

## 2020-10-21 MED ORDER — DOCUSATE SODIUM 50 MG/5ML PO LIQD
100.0000 mg | Freq: Two times a day (BID) | ORAL | Status: DC
  Administered 2020-10-21 – 2020-10-28 (×10): 100 mg
  Filled 2020-10-21 (×11): qty 10

## 2020-10-21 MED ORDER — VITAL 1.5 CAL PO LIQD
1000.0000 mL | ORAL | Status: DC
  Administered 2020-10-21 – 2020-10-28 (×7): 1000 mL
  Filled 2020-10-21 (×6): qty 1000

## 2020-10-21 MED ORDER — AMIODARONE HCL IN DEXTROSE 360-4.14 MG/200ML-% IV SOLN: 30.0000 mg/h | INTRAVENOUS | Status: DC

## 2020-10-21 MED ORDER — ROSUVASTATIN CALCIUM 20 MG PO TABS
40.0000 mg | ORAL_TABLET | Freq: Every day | ORAL | Status: DC
  Administered 2020-10-21 – 2020-10-24 (×4): 40 mg
  Filled 2020-10-21 (×3): qty 2

## 2020-10-21 MED ORDER — CLOPIDOGREL BISULFATE 75 MG PO TABS
75.0000 mg | ORAL_TABLET | Freq: Every day | ORAL | Status: DC
  Administered 2020-10-22 – 2020-10-29 (×8): 75 mg
  Filled 2020-10-21 (×9): qty 1

## 2020-10-21 MED ORDER — FENTANYL CITRATE (PF) 100 MCG/2ML IJ SOLN
INTRAMUSCULAR | Status: AC
  Administered 2020-10-21: 50 ug via INTRAVENOUS
  Filled 2020-10-21: qty 2

## 2020-10-21 MED ORDER — LEVALBUTEROL HCL 0.63 MG/3ML IN NEBU
0.6300 mg | INHALATION_SOLUTION | Freq: Four times a day (QID) | RESPIRATORY_TRACT | Status: DC | PRN
  Administered 2020-10-23 – 2020-10-24 (×3): 0.63 mg via RESPIRATORY_TRACT
  Filled 2020-10-21: qty 3

## 2020-10-21 NOTE — Progress Notes (Signed)
  HR remains in the 130s after IV lopressor 5 mg.    One option would be cardioversion, since rhythm change is recent and he is anticoagulated; but sedation would be difficult given his resp status.  He will likely need vent for that.  Will give another 5 metoprolol and 150 mg IV Amio to see if we can convert him to NSR.  This will help forward output.    IV Lasix already given but no output as of yet.    Spoke with the son again about the potential need for intubation if resp status does not improve.    Appreciate PCCM assistance.    Jettie Booze, MD

## 2020-10-21 NOTE — Progress Notes (Signed)
[place] for Heparin Indication: chest pain/ACS  Allergies  Allergen Reactions  . Anectine [Succinylcholine] Other (See Comments)    Atypical pseudocholinesterase deficiency  . Nsaids Other (See Comments)    Contraindicated (can only have asa 81 mg)    Patient Measurements: Height: 5\' 5"  (165.1 cm) Weight: 80.3 kg (177 lb) IBW/kg (Calculated) : 61.5 Heparin Dosing Weight: 75.9 kg  Vital Signs: Temp: 98.5 F (36.9 C) (12/03 1935) Temp Source: Axillary (12/03 1935) BP: 129/66 (12/03 2032) Pulse Rate: 103 (12/03 1900)  Labs: Recent Labs     0000 10/19/20 2146 10/19/20 2146 10/20/20 0121 10/20/20 0324 10/20/20 0324 10/20/20 0608 10/20/20 1040 10/20/20 1451 10/20/20 1451 10/20/20 2220 10/21/20 0252 10/21/20 1024 10/21/20 1609 10/21/20 1935  HGB  --  11.2*   < >  --  10.9*   < >  --   --   --   --   --  8.8*  --  9.9*  --   HCT  --  36.0*   < >  --  33.5*  --   --   --   --   --   --  28.1*  --  29.0*  --   PLT  --  349  --   --  335  --   --   --   --   --   --  258  --   --   --   APTT   < > 31   < >  --  42*  --   --   --  >200*   < > 49*  --  53*  --  83*  LABPROT  --  16.6*  --   --   --   --   --   --   --   --   --   --   --   --   --   INR  --  1.4*  --   --   --   --   --   --   --   --   --   --   --   --   --   HEPARINUNFRC  --   --   --   --  1.74*  --   --   --  1.70*  --   --   --  0.99*  --   --   CREATININE  --  1.30*   < >  --  1.62*  --   --    < > 1.42*  --  1.23 1.31*  --   --   --   TROPONINIHS  --  51*   < > 1,386* 1,096*  --  5,929*  --   --   --   --   --   --   --   --    < > = values in this interval not displayed.    Estimated Creatinine Clearance: 56.3 mL/min (A) (by C-G formula based on SCr of 1.31 mg/dL (H)).  Assessment: 64 y.o. male admitted with chest pain and history of atrial fibrillation on Xarelto prior to admission. Xarelto is on hold and patient transitioned to IV Heparin. Currently  following aPTT values due to effect of recent Xarelto on heparin levels.   APTT is 83 - therapeutic for goal on IV Heparin at 1350 units/hr.  CBC is low but stable at Hgb 9.9/Hct 29. No bleeding reported.  Goal of Therapy:  Heparin level 0.3-0.7 units/mL aPTT 66-102 seconds Monitor platelets by anticoagulation protocol: Yes   Plan:  Continue heparin to 1350 units/hr Daily heparin level, aPTT, CBC -- will continue to use aPTT level until heparin levels are correlating.    Sloan Leiter, PharmD, BCPS, BCCCP Clinical Pharmacist Please refer to [facility] for [place] numbers 10/21/2020, 8:43 PM

## 2020-10-21 NOTE — Procedures (Signed)
Intubation Procedure Note  EDGEL DEGNAN  [id number]  1956-01-05  Date:10/21/20  Time:4:48 PM   Provider Performing:Fortune Torosian C Tamala Julian    Procedure: Intubation (31500)  Indication(s) Respiratory Failure  Consent Risks of the procedure as well as the alternatives and risks of each were explained to the patient and/or caregiver.  Consent for the procedure was obtained and is signed in the bedside chart   Anesthesia Etomidate, Versed, Fentanyl and Rocuronium   Time Out Verified patient identification, verified procedure, site/side was marked, verified correct patient position, special equipment/implants available, medications/allergies/relevant history reviewed, required imaging and test results available.   Sterile Technique Usual hand hygeine, masks, and gloves were used   Procedure Description Patient positioned in bed supine.  Sedation given as noted above.  Patient was intubated with endotracheal tube using Glidescope.  View was Grade 1 full glottis .  Number of attempts was 1.  Colorimetric CO2 detector was consistent with tracheal placement.   Complications/Tolerance None; patient tolerated the procedure well. Chest X-ray is ordered to verify placement.   EBL Minimal   Specimen(s) None

## 2020-10-21 NOTE — Progress Notes (Addendum)
Progress Note  Patient Name: Ian [name] Date of Encounter: 10/21/2020  [facility] Cardiologist: Sherren Mocha, MD   Subjective   Called to bedside for nursing for increased work of breathing.   Inpatient Medications    Scheduled Meds: . albuterol  2.5 mg Nebulization TID  . aspirin EC  81 mg Oral Daily  . Chlorhexidine Gluconate Cloth  6 each Topical Daily  . clopidogrel  75 mg Oral Daily  . feeding supplement  237 mL Oral BID BM  . guaiFENesin  600 mg Oral BID  . icosapent Ethyl  2 g Oral BID  . insulin aspart  0-15 Units Subcutaneous TID WC  . insulin detemir  40 Units Subcutaneous QHS  . metoprolol tartrate  12.5 mg Oral Q8H  . multivitamin with minerals  1 tablet Oral Daily  . pantoprazole  40 mg Oral Daily  . potassium chloride  40 mEq Oral Once  . ramipril  10 mg Oral Daily  . ranolazine  500 mg Oral BID  . rosuvastatin  40 mg Oral QHS   Continuous Infusions: . sodium chloride Stopped (10/19/20 2328)  . sodium chloride 10 mL/hr at 10/21/20 0600  . amiodarone 60 mg/hr (10/21/20 0600)   Followed by  . amiodarone    . azithromycin Stopped (10/21/20 0248)  . cefTRIAXone (ROCEPHIN)  IV Stopped (10/21/20 0137)  . dextrose 5% lactated ringers 75 mL/hr at 10/21/20 0628  . heparin 1,150 Units/hr (10/21/20 0600)  . nitroGLYCERIN 15 mcg/min (10/21/20 0600)   PRN Meds: acetaminophen, dextrose, ondansetron (ZOFRAN) IV, sodium chloride flush   Vital Signs    Vitals:   10/21/20 0500 10/21/20 0600 10/21/20 0739 10/21/20 0800  BP: 106/74 105/76  (!) 142/83  Pulse: (!) 140 (!) 136  (!) 143  Resp: (!) 35 (!) 29  (!) 23  Temp:   97.6 F (36.4 C)   TempSrc:   Oral   SpO2: 97% 96%  93%  Weight:      Height:        Intake/Output Summary (Last 24 hours) at 10/21/2020 0855 Last data filed at 10/21/2020 0600 Gross per 24 hour  Intake 2378.41 ml  Output 500 ml  Net 1878.41 ml   Last 3 Weights 10/19/2020 10/19/2020 10/18/2020  Weight (lbs) 177 lb 181 lb 9.6 oz  177 lb  Weight (kg) 80.287 kg 82.373 kg 80.287 kg      Telemetry    Afib RVR with rates in the 160s - Personally Reviewed  ECG    Afib ventricular rate 138 - Personally Reviewed  Physical Exam   GEN: obese male in respiratory distress Neck: No JVD Cardiac: irregular rhythm, tachycardic rate.  Respiratory: breath sounds throughout, diminished in bases, mild crackles GI: Soft, nontender, non-distended  MS: mild B LE edema; No deformity. Neuro:  Nonfocal  Psych: Normal affect   Labs    High Sensitivity Troponin:   Recent Labs  Lab 10/18/20 0438 10/19/20 2146 10/20/20 0121 10/20/20 0324 10/20/20 0608  TROPONINIHS 44* 51* 1,386* 4,619* 5,929*      Chemistry Recent Labs  Lab 10/18/20 0214 10/19/20 0940 10/19/20 2146 10/20/20 0324 10/20/20 1451 10/20/20 2220 10/21/20 0252  NA   < > 135 133*   < > 133* 133* 134*  K   < > 4.4 4.3   < > 3.3* 3.1* 3.5  CL   < > 98 97*   < > 96* 98 99  CO2   < > 24 19*   < >  22 23 22   GLUCOSE   < > 425* 431*   < > 369* 221* 160*  BUN   < > 25 24*   < > 31* 30* 31*  CREATININE   < > 0.88 1.30*   < > 1.42* 1.23 1.31*  CALCIUM   < > 9.4 9.2   < > 8.5* 8.4* 8.3*  PROT  --   --  6.8  --   --   --   --   ALBUMIN  --   --  3.3*  --   --   --   --   AST  --   --  24  --   --   --   --   ALT  --   --  25  --   --   --   --   ALKPHOS  --   --  118  --   --   --   --   BILITOT  --   --  1.2  --   --   --   --   GFRNONAA   < > 91 >60   < > 56* >60 >60  GFRAA  --  106  --   --   --   --   --   ANIONGAP   < >  --  17*   < > 15 12 13    < > = values in this interval not displayed.     Hematology Recent Labs  Lab 10/19/20 2146 10/20/20 0324 10/21/20 0252  WBC 18.1* 19.4* 16.1*  RBC 4.39 4.10* 3.42*  HGB 11.2* 10.9* 8.8*  HCT 36.0* 33.5* 28.1*  MCV 82.0 81.7 82.2  MCH 25.5* 26.6 25.7*  MCHC 31.1 32.5 31.3  RDW 15.4 15.5 15.8*  PLT 349 335 258    BNP Recent Labs  Lab 10/19/20 2146  BNP 688.7*     DDimer No results for  input(s): DDIMER in the last 168 hours.   Radiology    CT ANGIO CHEST PE W OR WO CONTRAST  Result Date: 10/20/2020 CLINICAL DATA:  Chest pain EXAM: CT ANGIOGRAPHY CHEST WITH CONTRAST TECHNIQUE: Multidetector CT imaging of the chest was performed using the standard protocol during bolus administration of intravenous contrast. Multiplanar CT image reconstructions and MIPs were obtained to evaluate the vascular anatomy. CONTRAST:  64mL OMNIPAQUE IOHEXOL 350 MG/ML SOLN COMPARISON:  10/06/2020 FINDINGS: Cardiovascular: There is excellent opacification of the pulmonary arterial tree. There is no intraluminal filling defect seen to suggest acute pulmonary embolism. Central pulmonary arteries are of normal caliber. Coronary artery bypass grafting has been performed. Mild left ventricular dilation. Global cardiac size is within normal limits. No pericardial effusion. Moderate atherosclerotic calcification is seen within the thoracic aorta. No aortic aneurysm. Left subclavian chest port tip is seen within the superior vena cava, unchanged. Mediastinum/Nodes: Asymmetric enlargement of the right thyroid lobe with a 4 cm right thyroid nodule is stable since multiple prior examinations. This was noted to be metabolically inactive on prior PET CT examination of 10/30/2018. Interval development of mild shotty right paratracheal and right hilar adenopathy is likely reactive in nature. No frankly pathologic thoracic adenopathy. The esophagus is unremarkable. Lungs/Pleura: There has developed widespread nodular pulmonary infiltrate with dense consolidation within the right middle lobe most in keeping with multifocal pneumonic infiltrate. Previously noted pulmonary nodules are partially obscured by extensive pneumonic infiltrate. Dominant nodule within the left apex is again noted, compatible with metastatic disease. Mild left basilar  atelectasis. Small right pleural effusion. No pneumothorax. No central obstructing lesion. Upper  Abdomen: No acute abnormality. Musculoskeletal: No chest wall abnormality. No acute or significant osseous findings. Review of the MIP images confirms the above findings. IMPRESSION: Interval development of extensive multifocal pulmonary infiltrate and dense consolidation of the right middle lobe most in keeping with acute infection. Small associated right parapneumonic effusion. No central obstructing lesion. Multiple bilateral pulmonary nodules are identified, but partially obscured, in keeping with pulmonary metastatic disease. Mild left ventricular dilation. No pulmonary embolism. Aortic Atherosclerosis (ICD10-I70.0). Electronically Signed   By: Fidela Salisbury MD   On: 10/20/2020 04:59   DG CHEST PORT 1 VIEW  Result Date: 10/20/2020 CLINICAL DATA:  Chest pain. EXAM: PORTABLE CHEST 1 VIEW COMPARISON:  October 19, 2020. FINDINGS: Stable cardiomegaly. Status post coronary bypass graft. No pneumothorax is noted. Left subclavian Port-A-Cath is unchanged. Stable right basilar pneumonia or atelectasis is noted. Left lung is unremarkable. Bony thorax is unremarkable. IMPRESSION: Stable right basilar pneumonia or atelectasis. Aortic Atherosclerosis (ICD10-I70.0). Electronically Signed   By: Marijo Conception M.D.   On: 10/20/2020 08:18   DG Chest Port 1 View  Result Date: 10/19/2020 CLINICAL DATA:  Chest pain, dyspnea EXAM: PORTABLE CHEST 1 VIEW COMPARISON:  10/18/2020 FINDINGS: The lungs are symmetrically expanded. Since the prior examination, there has developed extensive right basilar pulmonary infiltrate, likely related to infection or aspiration. Patchy pulmonary infiltrate is also developed at the left lung base. Multiple pulmonary nodules are again identified within the left lung, better assessed on prior CT examination of 10/06/2020. No pneumothorax. Left subclavian chest port is seen with its tip within the superior vena cava. Coronary artery bypass grafting has been performed. Cardiac size within normal  limits. No acute bone abnormality. IMPRESSION: Interval development of bibasilar pulmonary infiltrate, more severe within the right lung base, infection versus aspiration. Electronically Signed   By: Fidela Salisbury MD   On: 10/19/2020 22:50    Cardiac Studies   Heart cath 01/04/20: 1.  Severe multivessel coronary artery disease with chronic total occlusion of the LAD and RCA, continued patency of the left mainstem and left circumflex stents with focal mild to moderate in-stent restenosis at the transition of the left main into the circumflex. 2.  Status post aortocoronary bypass surgery with continued patency of the LIMA to LAD graft and severe aorto ostial stenosis of the radial graft to PDA treated successfully with PCI using a 3.0 x 30 mm resolute Onyx DES with intravascular ultrasound guidance 3.  Nonobstructive ostial left circumflex restenosis as demonstrated by intravascular ultrasound with a minimal lumen area of 5.96 mm  Recommendations: Long-term dual antiplatelet therapy with aspirin and Effient, continued aggressive medical therapy, 2D echocardiogram for assessment of LV systolic function.   Echo 09/30/20: 1. Left ventricular ejection fraction, by estimation, is 40 to 45%. The  left ventricle has mildly decreased function. The left ventricle  demonstrates global hypokinesis. Left ventricular diastolic parameters are  consistent with Grade II diastolic  dysfunction (pseudonormalization).  2. Right ventricular systolic function is normal. The right ventricular  size is normal. There is normal pulmonary artery systolic pressure. The  estimated right ventricular systolic pressure is 54.6 mmHg.  3. Left atrial size was moderately dilated.  4. The mitral valve is normal in structure. Mild mitral valve  regurgitation. No evidence of mitral stenosis.  5. The aortic valve is tricuspid. Aortic valve regurgitation is not  visualized. Mild aortic valve stenosis. Aortic valve mean  gradient  measures  11.0 mmHg.  6. The inferior vena cava is dilated in size with >50% respiratory  variability, suggesting right atrial pressure of 8 mmHg.  7. Technically difficult study with very poor images even with Definity.  For more accurate EF determination, would consider cardiac MR.    Patient Profile     64 y.o. male with a history of CAD s/p CABG, last heart cath 01/04/20, echo with EF 40-45%, PAF, and metastatic colon cancer with pulmonary and hepatic mets  Assessment & Plan    Afib RVR - RVR overnight, amiodarone gtt started without bolus - called to bedside for rates in the 150-160 and increased work of breathing on 8L HF - gave amiodarone bolus and placed BiPAP - rates have now improved to 120-130s  - PCCM consulted for help with airway management - if rates continue to be elevated, can consider IV digoxin pending renal function   CAD s/p CABG - severe disease in 3/4 grafts, last intervention was PCI  - pt initially presented with chest pain found to have elevated CE peaked at 5929 - nitro drip started, has been stopped this morning for more pressure room to titrate rate controlling medications - continue heparin gtt for now   Acute on chronic systolic heart failure - EF 40-45% - stat CXR with superimposed pulmonary edema on PNA - will give 60 mg IV lasix x 1 dose - place foley catheter due to respiratory distress and diuresis   AKI - sCr 1.31 (1.23) - BMP daily - if continues to decline, may need to hold ACEI   Metastatic colon cancer (pulmonary and hepatic mets) - following with oncology    For questions or updates, please contact [facility] Please consult [email or web address] for contact info under        Signed, Ledora Bottcher, PA  10/21/2020, 8:55 AM    I have examined the patient and reviewed assessment and plan and discussed with patient.  Agree with above as stated.  Discussed with the son at bedside.  Patient in worsening resp  distress.  CXR personally reviewed.  Now with pulmonary edema.  Cr improved.  Will give some IV Lasix. BiPap.    Appreciate internal med assistance with DKA, pneumonia.  IV heparin for NSTEMI/demand ischemia and atrial flutter.  Amio started to help with rates.    Larae Grooms

## 2020-10-21 NOTE — Procedures (Signed)
Central Venous Catheter Insertion Procedure Note  Ian [name]  [id number]  05-10-1956  Date:10/21/20  Time:4:51 PM   Provider Performing:Dabria Wadas Cipriano Mile   Procedure: Insertion of Non-tunneled Central Venous [phone]) with US guidance (05183)   Indication(s) Medication administration  Consent Risks of the procedure as well as the alternatives and risks of each were explained to the patient and/or caregiver.  Consent for the procedure was obtained and is signed in the bedside chart  Anesthesia Topical only with 1% lidocaine   Timeout Verified patient identification, verified procedure, site/side was marked, verified correct patient position, special equipment/implants available, medications/allergies/relevant history reviewed, required imaging and test results available.  Sterile Technique Maximal sterile technique including full sterile barrier drape, hand hygiene, sterile gown, sterile gloves, mask, hair covering, sterile ultrasound probe cover (if used).  Procedure Description Area of catheter insertion was cleaned with chlorhexidine and draped in sterile fashion.  With real-time ultrasound guidance a central venous catheter was placed into the right internal jugular vein. Nonpulsatile blood flow and easy flushing noted in all ports.  The catheter was sutured in place and sterile dressing applied.  Complications/Tolerance None; patient tolerated the procedure well. Chest X-ray is ordered to verify placement for internal jugular or subclavian cannulation.   Chest x-ray is not ordered for femoral cannulation.  EBL Minimal  Specimen(s) None

## 2020-10-21 NOTE — Progress Notes (Signed)
[place] for Heparin Indication: chest pain/ACS  Allergies  Allergen Reactions  . Anectine [Succinylcholine] Other (See Comments)    Atypical pseudocholinesterase deficiency  . Nsaids Other (See Comments)    Contraindicated (can only have asa 81 mg)    Patient Measurements: Height: 5\' 5"  (165.1 cm) Weight: 80.3 kg (177 lb) IBW/kg (Calculated) : 61.5 Heparin Dosing Weight: 75.9 kg  Vital Signs: Temp: 98.3 F (36.8 C) (12/03 1153) Temp Source: Axillary (12/03 1153) BP: 106/75 (12/03 1150) Pulse Rate: 135 (12/03 1150)  Labs: Recent Labs     0000 10/19/20 2146 10/19/20 2146 10/20/20 0121 10/20/20 0324 10/20/20 0608 10/20/20 1040 10/20/20 1451 10/20/20 2220 10/21/20 0252 10/21/20 1024  HGB  --  11.2*   < >  --  10.9*  --   --   --   --  8.8*  --   HCT  --  36.0*  --   --  33.5*  --   --   --   --  28.1*  --   PLT  --  349  --   --  335  --   --   --   --  258  --   APTT   < > 31   < >  --  42*  --   --  >200* 49*  --  53*  LABPROT  --  16.6*  --   --   --   --   --   --   --   --   --   INR  --  1.4*  --   --   --   --   --   --   --   --   --   HEPARINUNFRC  --   --   --   --  1.74*  --   --  1.70*  --   --  0.99*  CREATININE  --  1.30*   < >  --  1.62*  --    < > 1.42* 1.23 1.31*  --   TROPONINIHS  --  51*   < > 1,386* 4,619* 5,929*  --   --   --   --   --    < > = values in this interval not displayed.    Estimated Creatinine Clearance: 56.3 mL/min (A) (by C-G formula based on SCr of 1.31 mg/dL (H)).  Assessment: 64 y.o. male with chest pain, h/o Afib and Xarelto on hold,on heparin at 1150 units/hr -aPTT= 53  -hg= 10.9>>8.8 (+ 2.2L)   Goal of Therapy:  Heparin level 0.3-0.7 units/mL aPTT 66-102 seconds Monitor platelets by anticoagulation protocol: Yes   Plan:  -Increase heparin to 1350 units/hr -aPTT in 6 hours -Daily heparin level, aPTT, CBC   Hildred Laser, PharmD Clinical Pharmacist **Pharmacist phone  directory can now be found on [email or web address] ([email or web address]).  Listed under [place].

## 2020-10-21 NOTE — Progress Notes (Addendum)
Inpatient Diabetes Program Recommendations  AACE/ADA: New Consensus Statement on Inpatient Glycemic Control (2015)  Target Ranges:  Prepandial:   less than 140 mg/dL      Peak postprandial:   less than 180 mg/dL (1-2 hours)      Critically ill patients:  140 - 180 mg/dL   Lab Results  Component Value Date   GLUCAP 252 (H) 10/21/2020   HGBA1C 13.2 (H) 10/19/2020    Review of Glycemic Control Results for ELZIE, KNISLEY (MRN [id number]) as of 10/21/2020 10:20  Ref. Range 10/21/2020 06:54  Glucose-Capillary Latest Ref Range: 70 - 99 mg/dL 252 (H)  Diabetes history: DM 2 Outpatient Diabetes medications: Farxiga 10 mg daily, Novolog 70/30 mix 20 units tid with meals, Basaglar 40 units q HS, Metformin 500 mg bid Current orders for Inpatient glycemic control:  Levemir 40 units q HS, Novolog moderate tid with meals  Inpatient Diabetes Program Recommendations:     Once eating consistently, consider adding Novolog 6 units tid with meals.  Also consider increasing frequency of Novolog correction to q 4 hours.  Will f/u with patient to discuss A1C once condition improves.   Thanks,  Adah Perl, RN, BC-ADM Inpatient Diabetes Coordinator Pager [phone] (8a-5p)

## 2020-10-21 NOTE — Progress Notes (Addendum)
Nutrition Follow-up  DOCUMENTATION CODES:   Non-severe (moderate) malnutrition in context of chronic illness  INTERVENTION:   Initiate tube feeds via OG tube: - Vital 1.5 @ 50 ml/hr (1200 ml/day) - ProSource TF 90 ml TID  Tube feeding regimen provides 2040 kcal, 147 grams of protein, and 917 ml of H2O.   NUTRITION DIAGNOSIS:   Moderate Malnutrition related to chronic illness (colon cancer, CAD s/p CABG) as evidenced by mild fat depletion, mild muscle depletion, severe muscle depletion.  Ongoing  GOAL:   Patient will meet greater than or equal to 90% of their needs  Met via TF  MONITOR:   Vent status, Labs, Weight trends, TF tolerance, I & O's  REASON FOR ASSESSMENT:   Malnutrition Screening Tool    ASSESSMENT:   Ian [name] is a 64 y.o. male with PMH of severe CAD s/p CABG (2008) c/b several STEMIs (10/20 and 02/21), T2DM, GERD, HTN, HLD, metastatic colon cancer.Undergoing chemo and had prior partial colectomy. Admitted for worsening chest pain, SOB, and PNA concerns.  12/03 - intubated, bronch  RD consulted for TF initiation and management. Pt with OG tube in distal stomach per abdominal x-ray. Per CCM note, plan is to transition to Cortrak. Next date of Cortrak service is 10/24/20.  Patient is currently intubated on ventilator support MV: 12.6 L/min Temp (24hrs), Avg:98.5 F (36.9 C), Min:97.6 F (36.4 C), Max:99.7 F (37.6 C) BP (a-line): 125/64 MAP (a-line): 82  Drips: Precedex Fentanyl Heparin Amiodarone Levophed  Medications reviewed and include: colace, SSI, levemir 40 units daily, MVI with minerals, protonix, miralax, IV abx, IV KCl 10 mEq x 4 runs  Labs reviewed: sodium 134, BUN 31, creatinine 1.31, ionized calcium 1.11, hemoglobin 9.9 CBG's: 156-336 x 24 hours  UOP: 500 ml x 24 hours I/O's: +3.7 L since admit  Diet Order:   Diet Order            Diet NPO time specified  Diet effective now                 EDUCATION NEEDS:    No education needs have been identified at this time  Skin:  Skin Assessment: Reviewed RN Assessment  Last BM:  10/19/20  Height:   Ht Readings from Last 1 Encounters:  10/20/20 _0  (1.651 m)    Weight:   Wt Readings from Last 1 Encounters:  10/19/20 80.3 kg    Ideal Body Weight:  59 kg  BMI:  Body mass index is 29.45 kg/m.  Estimated Nutritional Needs:   Kcal:  1926  Protein:  130-140  Fluid:  >2 L/day    Gustavus Bryant, MS, RD, LDN Inpatient Clinical Dietitian Please see AMiON for contact information.

## 2020-10-21 NOTE — Progress Notes (Signed)
  Amiodarone Drug - Drug Interaction Consult Note  Recommendations: Monitor PRN Zofran use  Amiodarone is metabolized by the cytochrome P450 system and therefore has the potential to cause many drug interactions. Amiodarone has an average plasma half-life of 50 days (range 20 to 100 days).   There is potential for drug interactions to occur several weeks or months after stopping treatment and the onset of drug interactions may be slow after initiating amiodarone.   [x]  Statins: Increased risk of myopathy. Simvastatin- restrict dose to 20mg  daily. Other statins: counsel patients to report any muscle pain or weakness immediately.  []  Anticoagulants: Amiodarone can increase anticoagulant effect. Consider warfarin dose reduction. Patients should be monitored closely and the dose of anticoagulant altered accordingly, remembering that amiodarone levels take several weeks to stabilize.  []  Antiepileptics: Amiodarone can increase plasma concentration of phenytoin, the dose should be reduced. Note that small changes in phenytoin dose can result in large changes in levels. Monitor patient and counsel on signs of toxicity.  [x]  Beta blockers: increased risk of bradycardia, AV block and myocardial depression. Sotalol - avoid concomitant use.  []   Calcium channel blockers (diltiazem and verapamil): increased risk of bradycardia, AV block and myocardial depression.  []   Cyclosporine: Amiodarone increases levels of cyclosporine. Reduced dose of cyclosporine is recommended.  []  Digoxin dose should be halved when amiodarone is started.  []  Diuretics: increased risk of cardiotoxicity if hypokalemia occurs.  []  Oral hypoglycemic agents (glyburide, glipizide, glimepiride): increased risk of hypoglycemia. Patient's glucose levels should be monitored closely when initiating amiodarone therapy.   [x]  Drugs that prolong the QT interval:  Torsades de pointes risk may be increased with concurrent use - avoid if  possible.  Monitor QTc, also keep magnesium/potassium WNL if concurrent therapy can't be avoided. Marland Kitchen Antibiotics: e.g. fluoroquinolones, erythromycin. . Antiarrhythmics: e.g. quinidine, procainamide, disopyramide, sotalol. . Antipsychotics: e.g. phenothiazines, haloperidol.  . Lithium, tricyclic antidepressants, and methadone. Thank You,  Narda Bonds  10/21/2020 2:40 AM

## 2020-10-21 NOTE — Procedures (Signed)
Arterial Catheter Insertion Procedure Note  Ian [name]  [id number]  [birth year]  Date:10/21/20  Time:4:52 PM    Provider Performing: Candee Furbish    Procedure: Insertion of Arterial Line [phone]) with US guidance ([id number])   Indication(s) Blood pressure monitoring and/or need for frequent ABGs  Consent Risks of the procedure as well as the alternatives and risks of each were explained to the patient and/or caregiver.  Consent for the procedure was obtained and is signed in the bedside chart  Anesthesia None   Time Out Verified patient identification, verified procedure, site/side was marked, verified correct patient position, special equipment/implants available, medications/allergies/relevant history reviewed, required imaging and test results available.   Sterile Technique Maximal sterile technique including full sterile barrier drape, hand hygiene, sterile gown, sterile gloves, mask, hair covering, sterile ultrasound probe cover (if used).   Procedure Description Area of catheter insertion was cleaned with chlorhexidine and draped in sterile fashion. With real-time ultrasound guidance an arterial catheter was placed into the right radial artery.  Appropriate arterial tracings confirmed on monitor.     Complications/Tolerance None; patient tolerated the procedure well.   EBL Minimal   Specimen(s) None

## 2020-10-21 NOTE — Progress Notes (Signed)
[place] for Heparin Indication: chest pain/ACS  Allergies  Allergen Reactions  . Anectine [Succinylcholine] Other (See Comments)    Atypical pseudocholinesterase deficiency  . Nsaids Other (See Comments)    Contraindicated (can only have asa 81 mg)    Patient Measurements: Height: 5\' 5"  (165.1 cm) Weight: 80.3 kg (177 lb) IBW/kg (Calculated) : 61.5 Heparin Dosing Weight: 75.9 kg  Vital Signs: Temp: 99.7 F (37.6 C) (12/03 0000) Temp Source: Oral (12/03 0000) BP: 117/69 (12/02 2259) Pulse Rate: 146 (12/02 2259)  Labs: Recent Labs    10/18/20 0214 10/19/20 0940 10/19/20 2146 10/19/20 2146 10/20/20 0121 10/20/20 0324 10/20/20 0324 10/20/20 0608 10/20/20 1040 10/20/20 1451 10/20/20 2220  HGB   < > 10.0* 11.2*  --   --  10.9*  --   --   --   --   --   HCT  --  33.0* 36.0*  --   --  33.5*  --   --   --   --   --   PLT  --  310 349  --   --  335  --   --   --   --   --   APTT  --   --  31   < >  --  42*  --   --   --  >200* 49*  LABPROT  --   --  16.6*  --   --   --   --   --   --   --   --   INR  --   --  1.4*  --   --   --   --   --   --   --   --   HEPARINUNFRC  --   --   --   --   --  1.74*  --   --   --  1.70*  --   CREATININE   < > 0.88 1.30*   < >  --  1.62*   < >  --  1.70* 1.42* 1.23  TROPONINIHS   < >  --  51*   < > 1,386* 4,619*  --  5,929*  --   --   --    < > = values in this interval not displayed.    Estimated Creatinine Clearance: 60 mL/min (by C-G formula based on SCr of 1.23 mg/dL).  Assessment: 64 y.o. male with chest pain, h/o Afib and Xarelto on hold, for heparin   Goal of Therapy:  Heparin level 0.3-0.7 units/mL aPTT 66-102 seconds Monitor platelets by anticoagulation protocol: Yes   Plan:  Increase Heparin 1150 units/hr APTT in 8 hours  Phillis Knack, PharmD, BCPS

## 2020-10-21 NOTE — Procedures (Signed)
Bedside Bronchoscopy Procedure Note Ian [name] [id number] Oct 13, 1956  Procedure: Bronchoscopy Indications: Diagnostic evaluation of the airways, Obtain specimens for culture and/or other diagnostic studies and Remove secretions  Procedure Details: ET Tube Size: ET Tube secured at lip (cm): Bite block in place: Yes In preparation for procedure, Patient hyper-oxygenated with 20 % FiO2 Airway entered and the following bronchi were examined: RUL, RML, RLL, LUL, LLL and Bronchi.   Bronchoscope removed.  , Patient placed back on 100% FiO2 at conclusion of procedure.    Evaluation BP (!) 68/59   Pulse (!) 125   Temp 98.3 F (36.8 C) (Axillary)   Resp (!) 26   Ht 5\' 5"  (1.651 m)   Wt 80.3 kg   SpO2 100%   BMI 29.45 kg/m  Breath Sounds:Clear and Diminished O2 sats: stable throughout Patient's Current Condition: stable Specimens:  Pink tinged secretions Complications: No apparent complications Patient did tolerate procedure well.  Assisted Dr. Tamala Julian with bronchoscopy.  Ian [name], Ian [name] 10/21/2020, 3:15 PM

## 2020-10-21 NOTE — Consult Note (Signed)
10/21/2020  I have seen and evaluated the patient for respiratory failure.  S:  64 year old man with hx of symptomatic CAD s/p CABG and multiple PCIs, ischemic cardiomyopathy, DM2, GERD, HTN, HLD, PAD s/p fempop bypass, paroxysmal Afib, newly found metastatic cancer presenting for persistent chest pain admitted to cardiology service.  O: Blood pressure (!) 68/59, pulse (!) 125, temperature 98.3 F (36.8 C), temperature source Axillary, resp. rate (!) 26, height _0  (1.651 m), weight 80.3 kg, SpO2 100 %.  Ill appearing man in acute distress on BIPAP Using accessory muscles, struggling Minimal peripheral edema Lungs with severe rhonci bilaterally, worse on right Moves all 4 ext to command Port in place Midline sternotomy scar CDI  A:  - Acute hypoxemic respiratory failure secondary to probably aspiration event failing BIPAP - Refractory angina, extensive coronary history detailed in Dr. Everlene Other note 12/2. - Stage IV colon cancer with lung and liver mets: was planned to resume chemo before latest setback - CKD - Anemia - Afib/RVR - DM2 with hyperglycemia  P:  - Intubate, usual lung protective mechanical ventilation, bronch with attention to RLL for BAL - Continue CAP coverage for now - Insulin gtt - Guarded prognosis relayed to son, if not much improvement over next day or two will need to consider switching GoC to comfort   Patient critically ill due to respiratory failure Interventions to address this today intubation, mechanical ventilation, bronchoscopy Risk of deterioration without these interventions is high  I personally spent 35 minutes providing critical care not including any separately billable procedures  Erskine Emery MD [facility] 10/21/2020 4:47 PM Personal pager: [phone] If unanswered, please page CCM On-call: [phone]        NAME:  Ian [name], MRN:  [id number], DOB:  [birth year], LOS: 2 ADMISSION DATE:  10/19/2020,  CONSULTATION DATE:  10/21/2020 REFERRING MD: Beau Fanny , CHIEF COMPLAINT:  Airway and vent management   Brief History   64 year old male with history of NSTEMI 09/30/2020, treated with conservative management with increasing nitrates and adding Ranexa.  11/10 he was diagnosed with multiple new hepatic and pulmonary metastatic areas with mild anterior bladder wall thickening.  He was referred to oncology with plan to start chemo next week.  He had an ED visit 11/30, and returned 10/19/2020 due to chest pain is occurring multiple times per day, at rest and with exertion. He was unable to perform any task without angina. He was admitted by the cardiology service. CTA was done which was + for extensive multifocal pulmonary infiltrate and dense consolidation of the right middle lobe most in keeping with acute infection. Small associated right parapneumonic effusion.He was place on BiPAP 10/21/2020 for desats on HFNC.  PCCM have been consulted for high risk for  intubation and vent management.    History of present illness   [name] is a 64 year old male with hx of CAD s/p CABG 2008, STEMI 08/2019 s/p DES x2 to L radial-PDA with DES x 2 to protected LM into LCx, repeat STEMI 12/2019 s/p DES to L radial-PDA, DM, GERD, HTN, HLD, ICM, PAD s/p R fempop BPG 02/2020, PAF (noted after FPBPG), and colon CA with recent new  dx of new hepatic and pulmonary metastasis (prior mets to liver s/p partial colectomy andchemotherapy).  Admitted for NSTEMI 09/30/2020, treated with conservative management with increasing nitrates and adding Ranexa.  He had an ED visit 11/30, and returned 10/19/2020 due to chest pain is occurring multiple times per  day, at rest and with exertion. He was unable to perform any task without angina. He was admitted by the cardiology service. CTA was done which was + for extensive multifocal pulmonary infiltrate and dense consolidation of the right middle lobe most in keeping with acute infection. Small  associated right parapneumonic effusion. He was place on BiPAP 10/21/2020 for desats on HFNC.  PCCM have been consulted for high risk for  intubation and vent management.   Past Medical History    Past Medical History:  Diagnosis Date  . 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 Colon cancer ([place])   . Complication of anesthesia    atypical pseudo-cholinesterase deficiency  . Coronary artery disease 01/10/2013   s/p CABG 2008 // s/p STEMI in 08/2019>>DES x 2 to L radial-PDA; staged PCI: DES x 2 to protected LM into LCx // s/p inf-lat STEMI 12/2019 >> DES to L Radial-PDA  . Diabetes mellitus   . Family Hx of adverse reaction to anesthesia    sister also has atypical pseudo-cholinesterase deficiency  . Gastroesophageal reflux disease   . History of heart bypass surgery   . HLD (hyperlipidemia)   . Hypertension   . Ischemic cardiomyopathy    Echo 01/04/2020: EF 45-50, mild LVH, Gr 2 DD, normal RVSF, mild to mod LAE, mild MR, mild AS (mean gradient 6 mmHg)  . Mild aortic stenosis   . Mild mitral regurgitation   . Myocardial infarction ([place])   . PAD (peripheral artery disease) ([facility])    s/p R fem-pop bypass 02/2020 (Dr. Donnetta Hutching)  . Paroxysmal atrial fibrillation ([facility])    CHADS-VASc=3 // noted post op after fem pop bypass in 02/2020; due to high risk, Apixaban started    [facility] Events   10/19/2020 Admission Recent admission 09/30/2020-10/01/2020 ED Visit 10/18/2020  Consults:  10/21/2020 PCCM  Procedures:  10/21/2020 Intubation  Significant Diagnostic Tests:  CTA  10/20/2020 Interval development of extensive multifocal pulmonary infiltrate and dense consolidation of the right middle lobe most in keeping with acute infection. Small associated right parapneumonic effusion. No central obstructing lesion. Multiple bilateral pulmonary nodules are identified, but partially obscured, in keeping with pulmonary metastatic disease. Mild left ventricular dilation. No pulmonary  embolism. Aortic Atherosclerosis (ICD10-I70.0).  Heart cath 01/04/20: 1. Severe multivessel coronary artery disease with chronic total occlusion of the LAD and RCA, continued patency of the left mainstem and left circumflex stents with focal mild to moderate in-stent restenosis at the transition of the left main into the circumflex. 2. Status post aortocoronary bypass surgery with continued patency of the LIMA to LAD graft and severe aorto ostial stenosis of the radial graft to PDA treated successfully with PCI using a 3.0 x 30 mm resolute Onyx DES with intravascular ultrasound guidance 3. Nonobstructive ostial left circumflex restenosis as demonstrated by intravascular ultrasound with a minimal lumen area of 5.96 mm  Recommendations: Long-term dual antiplatelet therapy with aspirin and Effient, continued aggressive medical therapy, 2D echocardiogram for assessment of LV systolic function.   Echo 09/30/20: 1. Left ventricular ejection fraction, by estimation, is 40 to 45%. The  left ventricle has mildly decreased function. The left ventricle  demonstrates global hypokinesis. Left ventricular diastolic parameters are  consistent with Grade II diastolic  dysfunction (pseudonormalization).  2. Right ventricular systolic function is normal. The right ventricular  size is normal. There is normal pulmonary artery systolic pressure. The  estimated right ventricular systolic pressure is 77.4 mmHg.  3. Left atrial size was moderately dilated.  4. The mitral  valve is normal in structure. Mild mitral valve  regurgitation. No evidence of mitral stenosis.  5. The aortic valve is tricuspid. Aortic valve regurgitation is not  visualized. Mild aortic valve stenosis. Aortic valve mean gradient  measures 11.0 mmHg.  6. The inferior vena cava is dilated in size with >50% respiratory  variability, suggesting right atrial pressure of 8 mmHg.  7. Technically difficult study with very poor images  even with Definity.  For more accurate EF determination, would consider cardiac MR.   Micro Data:  10/20/2020 Blood Cx: pending 10/19/2020 Covid 19 Negative 10/19/2020 Influenza A&B Negative  Antimicrobials:  Azithromycin 10/21/2020>> Rocephin 10/21/2020>>  Interim history/subjective:  Remains on BiPAP, RR 40 and he states he is getting tired.  BP soft after Lopressor and Amio load for HR in the 140's, now 120's. Sats are 100% on BiPAP. K of 3.5 is being repleted Plan for bronch once tubed WBC is 16 T max last 24 is 98.3  Objective   Blood pressure 106/75, pulse (!) 135, temperature 98.3 F (36.8 C), temperature source Axillary, resp. rate (!) 38, height _0  (1.651 m), weight 80.3 kg, SpO2 100 %.    FiO2 (%):  [60 %] 60 %   Intake/Output Summary (Last 24 hours) at 10/21/2020 1328 Last data filed at 10/21/2020 1000 Gross per 24 hour  Intake 2603.6 ml  Output 500 ml  Net 2103.6 ml   Filed Weights   10/19/20 2130  Weight: 80.3 kg    Examination: General: Elderly male on BiPAP, alert and oriented, mild distress HENT:NCAT, No LAD, BiPAP mask in place Lungs: Bilateral chest excursion, Coarse throughout with rhonchi R>L and diminished per right Cardiovascular: S1, S2, RRR, Tachy per tele, No RMG Abdomen: Soft, NT, ND, BS + Extremities: L leg cool to touch, no mottling, no obvious deformities, No edema Neuro: Awake and alert, MAE x 4, Interactive, following commands GU: Foley cath with concentrated urine noted  Resolved Hospital Problem list     Assessment & Plan:  Acute Respiratory Failure in setting of extensive multifocal pulmonary infiltrate and dense consolidation of the right middle lobe. Increasing oxygen requirements and high risk intubation  Plan BiPAP for now with plans for intubation Initiate low tidal volume ventilation, 4 to 8 cc/kg ideal body weight goal plateau less than 30 and driving pressure less than 15. Titrate oxygen and PEEP for sats of > 90 Tracheal  aspirate once intubated BD [employer] Antibiotics ( Azithro/ Rocephin) VAP protocol RASS 0 to -1 Fentanyl and precedex for sedation Bronch once intubated by Dr. Tamala Julian   Atrial Fibrillation RVR on amiodarone May need cardioversion once intubated Plan Per Cards  CAD s/p CABG Severe disease in 3/4 grafts Chest pain most likely multifactorial with pneumonia in addition to CAD  Plan Heparin gtt for now Treat pneumonia Bronch Per cards  Acute on Chronic systolic HF EF 81-19% Bilateral Pleural effusions Creatinine 1.31 300 cc's out with lasix 60 this am Net + 2500 cc's Plan Diurese as renal function allows Strict I&O CXR in am and prn Trend BNP  AKI Serum Creatinine 1/31 Hypokalemia Plan Trend BMET daily Maintain renal perfusion  Replete electrolytes prn MAP goal > 65 mm HG Consider holding ACEI  Metastatic Colon Cancer with new involvement Lung and Liver Seen by oncology, plan for chemo Plan Per oncology once he has survived current hospitalization  Nutrition Plan [place] Dietitian for TF managemnt  Glycemic Control Plan  CBG q 4 SSI  Magdalen Spatz, MSN, AGACNP-BC [place] for personal pager PCCM on call pager [phone] 10/21/2020 2:58 PM      Best practice (evaluated daily)   Diet: NPO Pain/Anxiety/Delirium protocol (if indicated): Precedex and Fentanyl VAP protocol (if indicated): initiated DVT prophylaxis: Heparin gtt GI prophylaxis: Protonix Glucose control: CBG/ SSI Mobility: BR last date of multidisciplinary goals of care discussion: Pending Family and staff present  Summary of discussion  Follow up goals of care discussion due Code Status: Full Disposition: ICU  Labs   CBC: Recent Labs  Lab 10/18/20 0214 10/19/20 0940 10/19/20 2146 10/20/20 0324 10/21/20 0252  WBC 8.7 13.8* 18.1* 19.4* 16.1*  NEUTROABS  --   --  15.0*  --   --   HGB 10.4*  10.0* 11.2* 10.9* 8.8*  HCT 32.8* 33.0* 36.0* 33.5* 28.1*  MCV 80.6 83 82.0 81.7 82.2  PLT 338 310 349 335 144    Basic Metabolic Panel: Recent Labs  Lab 10/20/20 0324 10/20/20 0324 10/20/20 0910 10/20/20 1040 10/20/20 1451 10/20/20 2220 10/21/20 0252 10/21/20 1024  NA 135  --   --  131* 133* 133* 134*  --   K 4.2  --   --  3.5 3.3* 3.1* 3.5  --   CL 97*  --   --  95* 96* 98 99  --   CO2 15*  --   --  17* _0 --   GLUCOSE 511*   < > 536* 508* 369* 221* 160*  --   BUN 28*  --   --  33* 31* 30* 31*  --   CREATININE 1.62*  --   --  1.70* 1.42* 1.23 1.31*  --   CALCIUM 9.0  --   --  8.8* 8.5* 8.4* 8.3*  --   MG  --   --   --   --   --   --   --  2.3  PHOS  --   --   --   --   --   --   --  4.6   < > = values in this interval not displayed.   GFR: Estimated Creatinine Clearance: 56.3 mL/min (A) (by C-G formula based on SCr of 1.31 mg/dL (H)). Recent Labs  Lab 10/19/20 0940 10/19/20 2146 10/20/20 0030 10/20/20 0121 10/20/20 0324 10/21/20 0252  PROCALCITON  --   --  <0.10  --   --   --   WBC 13.8* 18.1*  --   --  19.4* 16.1*  LATICACIDVEN  --   --   --  1.7  --   --     Liver Function Tests: Recent Labs  Lab 10/19/20 2146  AST 24  ALT 25  ALKPHOS 118  BILITOT 1.2  PROT 6.8  ALBUMIN 3.3*   No results for input(s): LIPASE, AMYLASE in the last 168 hours. No results for input(s): AMMONIA in the last 168 hours.  ABG    Component Value Date/Time   PHART 7.411 02/07/2015 1020   PCO2ART 37.1 02/07/2015 1020   PO2ART 70.3 (L) 02/07/2015 1020   HCO3 23.1 02/07/2015 1020   TCO2 27 02/18/2020 1132   ACIDBASEDEF 0.9 02/07/2015 1020   O2SAT 93.2 02/07/2015 1020     Coagulation Profile: Recent Labs  Lab 10/19/20 2146  INR 1.4*    Cardiac Enzymes: No results for input(s): CKTOTAL, CKMB, CKMBINDEX, TROPONINI in the last 168 hours.  HbA1C: Hgb A1c MFr Bld  Date/Time Value Ref Range Status  10/19/2020 09:46 PM 13.2 (H) 4.8 - 5.6 % Final    Comment:     (NOTE) Pre diabetes:          5.7%-6.4%  Diabetes:              >6.4%  Glycemic control for   <7.0% adults with diabetes   09/30/2020 04:54 PM 13.4 (H) 4.8 - 5.6 % Final    Comment:    (NOTE) Pre diabetes:          5.7%-6.4%  Diabetes:              >6.4%  Glycemic control for   <7.0% adults with diabetes     CBG: Recent Labs  Lab 10/21/20 0053 10/21/20 0152 10/21/20 0256 10/21/20 0654 10/21/20 1301  GLUCAP 166* 169* 156* 252* 336*    Review of Systems:   Unable, patient on BiPAP and sedated  Past Medical History  He,  has a past medical history of 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   Surgical History    Past Surgical History:  Procedure Laterality Date  . ABDOMINAL AORTOGRAM W/LOWER EXTREMITY Bilateral 02/18/2020   Procedure: ABDOMINAL AORTOGRAM W/LOWER EXTREMITY;  Surgeon: Marty Heck, MD;  Location: [place] [facility];  Service: Cardiovascular;  Laterality: Bilateral;  . COLONOSCOPY N/A 08/29/2018   Procedure: COLONOSCOPY;  Surgeon: Daneil Dolin, MD;  [facility];  Service: Endoscopy;  Laterality: N/A;  9:30  . CORONARY ARTERY BYPASS GRAFT     4 vessels  . CORONARY ATHERECTOMY N/A 09/15/2019   Procedure: CORONARY ATHERECTOMY;  Surgeon: Sherren Mocha, MD;  Location: [place] [facility];  Service: Cardiovascular;  LM-LCx Atherectomy - DES PCI (*Protected LM)   . CORONARY STENT INTERVENTION N/A 09/15/2019   Procedure: CORONARY STENT INTERVENTION;  Surgeon: Sherren Mocha, MD;  Location: [place] [facility];;  Successful orbital atherectomy, PTCA, and overlapping DES treatment of severe calcific stenosis in the left main and left circumflex using a 2.5x34 mm Resolute Onyx DES (left circumflex) and 3.0x30 mm Resolute Onyx DES (overlapped proximally in the LCx extending into left main)  . CORONARY STENT INTERVENTION N/A 01/04/2020   Procedure: CORONARY STENT INTERVENTION;  Surgeon: Sherren Mocha, MD;  Location: [place] [facility];  Service: Cardiovascular;  Laterality: N/A;  . CORONARY/GRAFT ACUTE MI REVASCULARIZATION N/A 09/14/2019   Procedure: Coronary/Graft Acute MI Revascularization;  Surgeon: Sherren Mocha, MD;  Location: [place] [facility];  Service: Cardiovascular;  Laterality: N/A;  . FEMORAL-POPLITEAL BYPASS GRAFT Right 02/25/2020   Procedure: FEMORAL-ABOVE KNEE POPLITEAL ARTERY BYPASS GRAFT with Saphenous vein;  Surgeon: Rosetta Posner, MD;  Location[facility];  Service: Vascular;  Laterality: Right;  . heart bypass    . INSERTION OF MESH  02/07/2015   Procedure: INSERTION OF MESH;  Surgeon: Aviva Signs Md, MD;  Location: [facility];  Service: General;;  . INTRAVASCULAR ULTRASOUND/IVUS N/A 01/04/2020   Procedure: Intravascular Ultrasound/IVUS;  Surgeon: Sherren Mocha, MD;  Location: [place] [facility];  Service: Cardiovascular;  Laterality: N/A;  . LEFT HEART CATH AND CORONARY ANGIOGRAPHY N/A 09/14/2019   Procedure: LEFT HEART CATH AND CORONARY ANGIOGRAPHY;  Surgeon: Sherren Mocha, MD;  Location: [facility];  Service: Cardiovascular;  Laterality: N/A;  . LEFT HEART CATH AND CORONARY ANGIOGRAPHY N/A 01/04/2020   Procedure: LEFT HEART CATH AND CORONARY ANGIOGRAPHY;  Surgeon: Sherren Mocha, MD;  Location: [place] [facility];  Service: Cardiovascular;  Laterality: N/A;  . PARTIAL COLECTOMY N/A 09/17/2018   Procedure: PARTIAL COLECTOMY WITH PARTIAL WEDGE RESECTION LIVER METASTASIS;  Surgeon: Aviva Signs, MD;  Location: [facility];  Service: General;  Laterality: N/A;  . POLYPECTOMY  08/29/2018    Procedure: POLYPECTOMY;  Surgeon: Daneil Dolin, MD;  [facility];  Service: Endoscopy;;  . PORTACATH PLACEMENT Left 10/10/2018   Procedure: INSERTION PORT-A-CATH (catheter in left subclavian);  Surgeon: Aviva Signs, MD;  Location: [facility];  Service: General;  Laterality: Left;  . UMBILICAL HERNIA REPAIR N/A 02/07/2015   Procedure: UMBILICAL HERNIORRHAPHY WITH MESH;  Surgeon: Aviva Signs Md, MD;  Location: [facility];  Service: General;  Laterality: N/A;     Social History   reports that he has never smoked. He has never used smokeless tobacco. He reports that he does not drink alcohol and does not use drugs.   Family History   His family history includes Diabetes in his brother, brother, father, sister, and sister; Heart disease in his brother and father; Hypertension in his brother, father, sister, and son; Lymphoma in his brother.   Allergies Allergies  Allergen Reactions  . Anectine [Succinylcholine] Other (See Comments)    Atypical pseudocholinesterase deficiency  . Nsaids Other (See Comments)    Contraindicated (can only have asa 81 mg)     Home Medications  Prior to Admission medications   Medication Sig Start Date End Date Taking? Authorizing Provider  apixaban (ELIQUIS) 5 MG TABS tablet Take 1 tablet (5 mg total) by mouth 2 (two) times daily. 05/31/20  Yes Sherren Mocha, MD  aspirin EC 81 MG tablet Take 81 mg by mouth in the morning. Swallow whole.   Yes [provider]  clopidogrel (PLAVIX) 75 MG tablet Take 75 mg by mouth daily. 10/06/20  Yes [provider]  Coenzyme Q10 (COQ10) 100 MG CAPS Take 100 mg by mouth daily.   Yes [provider]  diltiazem (CARDIZEM CD) 360 MG 24 hr capsule Take 1 capsule (360 mg total) by mouth daily. 04/01/20 03/27/21 Yes Sherren Mocha, MD  diphenhydramine-acetaminophen (TYLENOL PM) 25-500 MG TABS tablet Take 2 tablets by mouth at bedtime.   Yes [provider]  Evolocumab (REPATHA SURECLICK) 161  MG/ML SOAJ Inject 140 mg into the skin every 14 (fourteen) days. 08/02/20  Yes Sherren Mocha, MD  ezetimibe-simvastatin (VYTORIN) 10-80 MG tablet Take 1 tablet by mouth daily.  09/03/20  Yes [provider]  FARXIGA 10 MG TABS tablet Take 10 mg by mouth daily.  12/19/18  Yes [provider]  hydrochlorothiazide (HYDRODIURIL) 25 MG tablet Take 1 tablet (25 mg total) by mouth daily. 09/24/19  Yes Bhagat, Bhavinkumar, PA  icosapent Ethyl (VASCEPA) 1 g capsule Take 2 capsules (2 g total) by mouth 2 (two) times daily. 09/27/20  Yes Weaver, Scott T, PA-C  insulin aspart protamine - aspart (NOVOLOG MIX 70/30 FLEXPEN) (70-30) 100 UNIT/ML FlexPen Inject 20 Units into the skin in the morning, at noon, and at bedtime.    Yes [provider]  Insulin Glargine (BASAGLAR KWIKPEN) 100 UNIT/ML SOPN Inject 40 Units into the skin at bedtime.  12/10/18  Yes [provider]  isosorbide mononitrate (IMDUR) 30 MG 24 hr tablet Take 1 tablet (  30 mg total) by mouth every evening. Patient taking differently: Take 30 mg by mouth at bedtime.  10/01/20 12/30/20 Yes [place] Eric J, DO  isosorbide mononitrate (IMDUR) 60 MG 24 hr tablet Take 1 tablet (60 mg total) by mouth daily. Patient taking differently: Take 60 mg by mouth in the morning.  02/09/20  Yes Weaver, Scott T, PA-C  JANUVIA 100 MG tablet Take 100 mg by mouth daily.  12/19/18  Yes [provider]  metFORMIN (GLUCOPHAGE) 500 MG tablet Take 500 mg by mouth 2 (two) times daily. 08/27/19  Yes [provider]  metoprolol succinate (TOPROL-XL) 100 MG 24 hr tablet Take 1 tablet (100 mg total) by mouth 2 (two) times daily. Take with or immediately following a meal. 09/16/19  Yes Reino Bellis B, NP  nitroGLYCERIN (NITROSTAT) 0.4 MG SL tablet Place 1 tablet (0.4 mg total) under the tongue every 5 (five) minutes as needed for chest pain. 10/18/20  Yes Larene Pickett, PA-C  pantoprazole (PROTONIX) 40 MG tablet Take 40 mg by mouth  daily.   Yes [provider]  ramipril (ALTACE) 10 MG capsule Take 10 mg by mouth daily.   Yes [provider]  ranolazine (RANEXA) 500 MG 12 hr tablet Take 1 tablet (500 mg total) by mouth 2 (two) times daily. 10/01/20 12/30/20 Yes [place] Eric J, DO  rosuvastatin (CRESTOR) 40 MG tablet Take 1 tablet (40 mg total) by mouth at bedtime. 01/06/20  Yes Reino Bellis B, NP  clopidogrel (PLAVIX) 300 MG TABS tablet Take 1 tablet (300 mg total) by mouth as directed. TAKE THIS PM 10/19/20   Tommie Raymond, NP     Critical care time: 15 minutes    Magdalen Spatz, MSN, AGACNP-BC [place] for personal pager PCCM on call pager [phone] 10/21/2020 3:01 PM

## 2020-10-21 NOTE — Plan of Care (Signed)
  Problem: Clinical Measurements: Goal: Ability to maintain clinical measurements within normal limits will improve Outcome: Progressing Goal: Will remain free from infection Outcome: Progressing Goal: Diagnostic test results will improve Outcome: Progressing Goal: Respiratory complications will improve Outcome: Progressing Goal: Cardiovascular complication will be avoided Outcome: Progressing   Problem: Nutrition: Goal: Adequate nutrition will be maintained Outcome: Progressing   Problem: Coping: Goal: Level of anxiety will decrease Outcome: Progressing   Problem: Pain Managment: Goal: General experience of comfort will improve Outcome: Progressing   Problem: Safety: Goal: Ability to remain free from injury will improve Outcome: Progressing   Problem: Skin Integrity: Goal: Risk for impaired skin integrity will decrease Outcome: Progressing   

## 2020-10-21 NOTE — Progress Notes (Signed)
Paged by nursing for RVR in the rates in the 140-150s with dyspnea. Chart review shows he is being treated for PNA, which is likely exacerbating his underlying PAF. Continue eliquis for stroke prevention. Pressure is SBP 110s. I will increase 12.5 mg lopressor from BID to q8hr. Home medications 360 mg cardizem and 100 mg toprol BID currently on hold for soft pressures. Suspect his rates will improve once his infection is treated.

## 2020-10-21 NOTE — Progress Notes (Signed)
PROGRESS NOTE  Ian [name]  DOB: 16-Sep-1956  PCP: Sharilyn Sites, MD MLY:[id number]  DOA: 10/19/2020  LOS: 2 days   Chief Complaint  Patient presents with  . Chest Pain  . Shortness of Breath   Brief narrative: Ian [name] is a 64 y.o. male with PMH significant for T2DM, HTN, HLD, CAD s/p CABG 2008, STEMIs in 2020 and 2021 s/p stents, ischemic cardiomyopathy EF 40 to 45%, PAD status post right femoropopliteal bypass (02/2020), paroxysmal A. Fib on DOAC, GERD, colon cancer s/p partial colectomy and chemotherapy now with new mets to liver and lungs. On 10/19/2020, patient presented to the ED with complaint of chest pain and shortness of breath. As an outpatient, cardiology was in process of setting up patient for outpatient cath but patient's symptoms persisted and required to come to ED. EKG showed ST depressions. Troponin was elevated and trended up. Patient was admitted under cardiology service, started on heparin drip.  Patient was also noted to have other acute medical issues including WBC count elevated to 19.4, CT chest concerning for pneumonia, blood sugar elevated to over 500s and anion gap elevated to 23. Hospitalist consultation was called for comanagement of medical issues.  Subjective: Patient was seen and examined this morning. Patient remains tachycardic, tachypneic on BiPAP.  Able to open eyes on verbal command but looks very tired. Chart reviewed Tachycardic to 140s, seems to be on BiPAP this morning. Labs from this morning with creatinine slightly up at 1.31, WBC count elevated to 16.1, hemoglobin low at 8.8  Assessment/Plan: NSTEMI Significant history of CAD, STEMIs s/p CABG and stents -Presented with worsening/persistent episodes of chest pain.  Troponin trend up as below.  EKG with ST depressions -Cardiology is the primary team.  Currently on heparin drip. Recent Labs    10/20/20 0121 10/20/20 0324 10/20/20 0608  TROPONINIHS 1,386* 4,619* 5,929*    Sepsis - POA Multifocal pneumonia -On presentation, patient had shortness of breath, cough, tachycardia, tachypnea, elevated WBC count to 19.4. -CT chest concerning for multifocal pulmonary infiltrate of the right middle lobe with associated small parapneumonic effusion. -Lactic acid level normal and procalcitonin level normal as well. -Currently on empiric treatment with IV Rocephin and IV azithromycin. -Continue to monitor. Recent Labs  Lab 10/18/20 0214 10/19/20 0940 10/19/20 2146 10/20/20 0030 10/20/20 0121 10/20/20 0324 10/21/20 0252  WBC 8.7 13.8* 18.1*  --   --  19.4* 16.1*  LATICACIDVEN  --   --   --   --  1.7  --   --   PROCALCITON  --   --   --  <0.10  --   --   --    Acute respiratory failure with hypoxia -Oxygen demand seems to be worsening. -Patient is on BiPAP this morning.  Despite diuresis, tachypneic and could be decompensating soon.  Noted PCCM consultation  A. fib with RVR -History of paroxysmal A. Fib.  Home meds include Cardizem and Toprol.   -Cardiology titrating medications. Currently on amiodarone drip and Lopressor 12.5 mg 3 times daily. -Currently on heparin drip.  Diabetic ketoacidosis -POA -On admission, blood sugar elevated to over 500, anion gap elevated to 19, beta hydroxy butyrate level elevated to 3.09 -Currently on insulin drip with DKA protocol. -Serial BMP being checked. -Currently on normal saline at 50 mill per hour.  Judicious hydration because of coexisting systolic CHF.  Uncontrolled type 2 diabetes mellitus -Last A1c 13.2 on 12/1 -Home meds include Lantus 40 units at bedtime, Farxiga 10 mg daily,  Januvia 1 mg daily, Metformin 5 mg twice daily, -I would continue telemetry for now.  Once patient's oral intake improves and blood sugar settles down to normal range, we can gradually reinitiate subcutaneous insulin. -Diabetic education consult  AKI -Expected to improve with hydration. Recent Labs    09/30/20 1007 10/01/20 0442  10/18/20 0214 10/19/20 0940 10/19/20 2146 10/20/20 0324 10/20/20 1040 10/20/20 1451 10/20/20 2220 10/21/20 0252  BUN 23 17 22 25  24* 28* 33* 31* 30* 31*  CREATININE 1.17 0.96 1.34* 0.88 1.30* 1.62* 1.70* 1.42* 1.23 1.31*   Metastatic colon cancer -colon cancer s/p partial colectomy and chemotherapy now with new mets to liver and lungs. -Follows up with oncology as an outpatient.  Mobility: Needs PT eval once medically stable Code Status:   Code Status: Full Code  Nutritional status: Body mass index is 29.45 kg/m. Nutrition Problem: Moderate Malnutrition Etiology: chronic illness (colon cancer, CAD s/p CABG) Signs/Symptoms: mild fat depletion, mild muscle depletion, severe muscle depletion Diet Order            Diet heart healthy/carb modified Room service appropriate? Yes; Fluid consistency: Thin  Diet effective now                 DVT prophylaxis:    Antimicrobials:  Rocephin and azithromycin Fluid: Normal saline at 50 mill per hour Family Communication:  None at bedside  Infusions:  . sodium chloride Stopped (10/19/20 2328)  . sodium chloride 10 mL/hr at 10/21/20 0600  . amiodarone 60 mg/hr (10/21/20 0905)  . azithromycin Stopped (10/21/20 0248)  . cefTRIAXone (ROCEPHIN)  IV Stopped (10/21/20 0137)  . dextrose 5% lactated ringers 75 mL/hr at 10/21/20 0628  . heparin 1,150 Units/hr (10/21/20 0600)  . nitroGLYCERIN 15 mcg/min (10/21/20 0600)    Scheduled Meds: . albuterol  2.5 mg Nebulization TID  . aspirin EC  81 mg Oral Daily  . Chlorhexidine Gluconate Cloth  6 each Topical Daily  . clopidogrel  75 mg Oral Daily  . feeding supplement  237 mL Oral BID BM  . furosemide  60 mg Intravenous Once  . guaiFENesin  600 mg Oral BID  . icosapent Ethyl  2 g Oral BID  . insulin aspart  0-15 Units Subcutaneous TID WC  . insulin detemir  40 Units Subcutaneous QHS  . metoprolol tartrate  12.5 mg Oral Q8H  . multivitamin with minerals  1 tablet Oral Daily  .  pantoprazole  40 mg Oral Daily  . potassium chloride  40 mEq Oral Once  . ramipril  10 mg Oral Daily  . ranolazine  500 mg Oral BID  . rosuvastatin  40 mg Oral QHS    Antimicrobials: Anti-infectives (From admission, onward)   Start     Dose/Rate Route Frequency Ordered Stop   10/20/20 2359  cefTRIAXone (ROCEPHIN) 2 g in sodium chloride 0.9 % 100 mL IVPB        2 g 200 mL/hr over 30 Minutes Intravenous Daily at bedtime 10/20/20 2306 10/25/20 2159   10/20/20 2359  azithromycin (ZITHROMAX) 500 mg in sodium chloride 0.9 % 250 mL IVPB        500 mg 250 mL/hr over 60 Minutes Intravenous Daily at bedtime 10/20/20 2306 10/25/20 2159   10/20/20 1100  Ampicillin-Sulbactam (UNASYN) 3 g in sodium chloride 0.9 % 100 mL IVPB  Status:  Discontinued        3 g 200 mL/hr over 30 Minutes Intravenous Every 8 hours 10/20/20 1011 10/20/20 2306   10/20/20  0200  piperacillin-tazobactam (ZOSYN) IVPB 3.375 g  Status:  Discontinued        3.375 g 12.5 mL/hr over 240 Minutes Intravenous Every 8 hours 10/20/20 0102 10/20/20 1011   10/20/20 0130  vancomycin (VANCOREADY) IVPB 750 mg/150 mL  Status:  Discontinued        750 mg 150 mL/hr over 60 Minutes Intravenous Every 12 hours 10/20/20 0102 10/20/20 1011      PRN meds: acetaminophen, dextrose, ondansetron (ZOFRAN) IV, sodium chloride flush   Objective: Vitals:   10/21/20 0800 10/21/20 0850  BP: (!) 142/83 128/74  Pulse: (!) 143 (!) 136  Resp: (!) 23 (!) 33  Temp:    SpO2: 93% 95%    Intake/Output Summary (Last 24 hours) at 10/21/2020 0917 Last data filed at 10/21/2020 0600 Gross per 24 hour  Intake 2363.01 ml  Output 500 ml  Net 1863.01 ml   Filed Weights   10/19/20 2130  Weight: 80.3 kg   Weight change:  Body mass index is 29.45 kg/m.   Physical Exam: General exam: Middle-aged Caucasian male.  Lethargic on BiPAP Skin: No rashes, lesions or ulcers. HEENT: Atraumatic, normocephalic, no obvious bleeding Lungs: Tachypneic, clear to  auscultation bilaterally CVS: Fast A. fib, no murmur GI/Abd soft, nontender, nondistended, bowel sound present CNS: Lethargic, opens eyes on verbal command Psychiatry: Depressed look Extremities: No pedal edema, no calf tenderness  Data Review: I have personally reviewed the laboratory data and studies available.  Recent Labs  Lab 10/18/20 0214 10/19/20 0940 10/19/20 2146 10/20/20 0324 10/21/20 0252  WBC 8.7 13.8* 18.1* 19.4* 16.1*  NEUTROABS  --   --  15.0*  --   --   HGB 10.4* 10.0* 11.2* 10.9* 8.8*  HCT 32.8* 33.0* 36.0* 33.5* 28.1*  MCV 80.6 83 82.0 81.7 82.2  PLT 338 310 349 335 258   Recent Labs  Lab 10/20/20 0324 10/20/20 0324 10/20/20 0910 10/20/20 1040 10/20/20 1451 10/20/20 2220 10/21/20 0252  NA 135  --   --  131* 133* 133* 134*  K 4.2  --   --  3.5 3.3* 3.1* 3.5  CL 97*  --   --  95* 96* 98 99  CO2 15*  --   --  17* 22 23 22   GLUCOSE 511*   < > 536* 508* 369* 221* 160*  BUN 28*  --   --  33* 31* 30* 31*  CREATININE 1.62*  --   --  1.70* 1.42* 1.23 1.31*  CALCIUM 9.0  --   --  8.8* 8.5* 8.4* 8.3*   < > = values in this interval not displayed.    F/u labs ordered.  Signed, Terrilee Croak, MD [facility] 10/21/2020

## 2020-10-21 NOTE — Procedures (Signed)
Bronchoscopy Procedure Note  VERMON GRAYS  [id number]  [birth year]  Date:10/21/20  Time:4:50 PM   Provider Performing:Aliese Brannum C Tamala Julian   Procedure(s):  Flexible bronchoscopy with bronchial alveolar lavage [phone])  Indication(s) pneumonia  Consent Risks of the procedure as well as the alternatives and risks of each were explained to the patient and/or caregiver.  Consent for the procedure was obtained and is signed in the bedside chart  Anesthesia In place for intubation   Time Out Verified patient identification, verified procedure, site/side was marked, verified correct patient position, special equipment/implants available, medications/allergies/relevant history reviewed, required imaging and test results available.   Sterile Technique Usual hand hygiene, masks, gowns, and gloves were used   Procedure Description Bronchoscope advanced through endotracheal tube and into airway.  Airways were examined down to subsegmental level with findings noted below.   Following diagnostic evaluation, BAL(s) performed in RLL with normal saline and return of cloudy fluid  Findings:  - ETT in good position - Small thick bloody secretions draping carina - No endobronchial lesions - BAL with cloudy return   Complications/Tolerance None; patient tolerated the procedure well. Chest X-ray is not needed post procedure.   EBL Minimal   Specimen(s) RLL BAL

## 2020-10-22 ENCOUNTER — Inpatient Hospital Stay (HOSPITAL_COMMUNITY): Payer: Federal, State, Local not specified - PPO

## 2020-10-22 DIAGNOSIS — I200 Unstable angina: Secondary | ICD-10-CM | POA: Diagnosis not present

## 2020-10-22 DIAGNOSIS — Z515 Encounter for palliative care: Secondary | ICD-10-CM

## 2020-10-22 DIAGNOSIS — E440 Moderate protein-calorie malnutrition: Secondary | ICD-10-CM | POA: Diagnosis not present

## 2020-10-22 DIAGNOSIS — I350 Nonrheumatic aortic (valve) stenosis: Secondary | ICD-10-CM | POA: Diagnosis not present

## 2020-10-22 DIAGNOSIS — I248 Other forms of acute ischemic heart disease: Secondary | ICD-10-CM | POA: Diagnosis not present

## 2020-10-22 DIAGNOSIS — I214 Non-ST elevation (NSTEMI) myocardial infarction: Secondary | ICD-10-CM | POA: Diagnosis not present

## 2020-10-22 LAB — MAGNESIUM
Magnesium: 2.2 mg/dL (ref 1.7–2.4)
Magnesium: 2.3 mg/dL (ref 1.7–2.4)

## 2020-10-22 LAB — HEPARIN LEVEL (UNFRACTIONATED): Heparin Unfractionated: 0.91 IU/mL — ABNORMAL HIGH (ref 0.30–0.70)

## 2020-10-22 LAB — CBC
HCT: 30 % — ABNORMAL LOW (ref 39.0–52.0)
Hemoglobin: 9.5 g/dL — ABNORMAL LOW (ref 13.0–17.0)
MCH: 25.2 pg — ABNORMAL LOW (ref 26.0–34.0)
MCHC: 31.7 g/dL (ref 30.0–36.0)
MCV: 79.6 fL — ABNORMAL LOW (ref 80.0–100.0)
Platelets: 312 10*3/uL (ref 150–400)
RBC: 3.77 MIL/uL — ABNORMAL LOW (ref 4.22–5.81)
RDW: 16 % — ABNORMAL HIGH (ref 11.5–15.5)
WBC: 15.4 10*3/uL — ABNORMAL HIGH (ref 4.0–10.5)
nRBC: 0 % (ref 0.0–0.2)

## 2020-10-22 LAB — ECHOCARDIOGRAM LIMITED
Area-P 1/2: 6.12 cm2
Height: 65 in
S' Lateral: 3.8 cm
Weight: 2832 oz

## 2020-10-22 LAB — COMPREHENSIVE METABOLIC PANEL
ALT: 170 U/L — ABNORMAL HIGH (ref 0–44)
AST: 259 U/L — ABNORMAL HIGH (ref 15–41)
Albumin: 2 g/dL — ABNORMAL LOW (ref 3.5–5.0)
Alkaline Phosphatase: 174 U/L — ABNORMAL HIGH (ref 38–126)
Anion gap: 13 (ref 5–15)
BUN: 52 mg/dL — ABNORMAL HIGH (ref 8–23)
CO2: 19 mmol/L — ABNORMAL LOW (ref 22–32)
Calcium: 7.8 mg/dL — ABNORMAL LOW (ref 8.9–10.3)
Chloride: 101 mmol/L (ref 98–111)
Creatinine, Ser: 2.48 mg/dL — ABNORMAL HIGH (ref 0.61–1.24)
GFR, Estimated: 28 mL/min — ABNORMAL LOW (ref >60)
Glucose, Bld: 352 mg/dL — ABNORMAL HIGH (ref 70–99)
Potassium: 3.8 mmol/L (ref 3.5–5.1)
Sodium: 133 mmol/L — ABNORMAL LOW (ref 135–145)
Total Bilirubin: 0.4 mg/dL (ref 0.3–1.2)
Total Protein: 5.3 g/dL — ABNORMAL LOW (ref 6.5–8.1)

## 2020-10-22 LAB — GLUCOSE, CAPILLARY
Glucose-Capillary: 330 mg/dL — ABNORMAL HIGH (ref 70–99)
Glucose-Capillary: 331 mg/dL — ABNORMAL HIGH (ref 70–99)
Glucose-Capillary: 344 mg/dL — ABNORMAL HIGH (ref 70–99)
Glucose-Capillary: 347 mg/dL — ABNORMAL HIGH (ref 70–99)
Glucose-Capillary: 386 mg/dL — ABNORMAL HIGH (ref 70–99)
Glucose-Capillary: 396 mg/dL — ABNORMAL HIGH (ref 70–99)
Glucose-Capillary: 396 mg/dL — ABNORMAL HIGH (ref 70–99)

## 2020-10-22 LAB — APTT: aPTT: 91 seconds — ABNORMAL HIGH (ref 24–36)

## 2020-10-22 LAB — PHOSPHORUS
Phosphorus: 3.2 mg/dL (ref 2.5–4.6)
Phosphorus: 3.3 mg/dL (ref 2.5–4.6)

## 2020-10-22 LAB — BRAIN NATRIURETIC PEPTIDE: B Natriuretic Peptide: 1460.1 pg/mL — ABNORMAL HIGH (ref 0.0–100.0)

## 2020-10-22 MED ORDER — PERFLUTREN LIPID MICROSPHERE
INTRAVENOUS | Status: AC
  Administered 2020-10-22: 4.5 mL
  Filled 2020-10-22: qty 10

## 2020-10-22 MED ORDER — INSULIN ASPART 100 UNIT/ML ~~LOC~~ SOLN
7.0000 [IU] | SUBCUTANEOUS | Status: DC
  Administered 2020-10-22 – 2020-10-23 (×7): 7 [IU] via SUBCUTANEOUS

## 2020-10-22 MED ORDER — INSULIN ASPART 100 UNIT/ML ~~LOC~~ SOLN
0.0000 [IU] | SUBCUTANEOUS | Status: DC
  Administered 2020-10-22 (×3): 20 [IU] via SUBCUTANEOUS
  Administered 2020-10-22 – 2020-10-23 (×5): 15 [IU] via SUBCUTANEOUS

## 2020-10-22 MED ORDER — INSULIN DETEMIR 100 UNIT/ML ~~LOC~~ SOLN
30.0000 [IU] | Freq: Two times a day (BID) | SUBCUTANEOUS | Status: DC
  Filled 2020-10-22: qty 0.3

## 2020-10-22 MED ORDER — ACETAMINOPHEN 160 MG/5ML PO SOLN
650.0000 mg | ORAL | Status: DC | PRN
  Administered 2020-10-22: 650 mg
  Filled 2020-10-22: qty 20.3

## 2020-10-22 MED ORDER — PERFLUTREN LIPID MICROSPHERE
1.0000 mL | INTRAVENOUS | Status: AC | PRN
  Filled 2020-10-22: qty 10

## 2020-10-22 MED ORDER — INSULIN DETEMIR 100 UNIT/ML ~~LOC~~ SOLN
30.0000 [IU] | Freq: Two times a day (BID) | SUBCUTANEOUS | Status: DC
  Administered 2020-10-22 – 2020-10-23 (×3): 30 [IU] via SUBCUTANEOUS
  Filled 2020-10-22 (×4): qty 0.3

## 2020-10-22 MED ORDER — INSULIN ASPART 100 UNIT/ML ~~LOC~~ SOLN
4.0000 [IU] | SUBCUTANEOUS | Status: DC
  Administered 2020-10-22: 4 [IU] via SUBCUTANEOUS

## 2020-10-22 NOTE — Progress Notes (Signed)
Progress Note  Patient Name: Ian [name] Date of Encounter: 10/22/2020  [facility] Cardiologist: Sherren Mocha, MD   Patient Profile     64 y.o. male with a history of CAD s/p CABG, last heart cath 01/04/20 in the setting of non-STEMI>> multivessel disease treated medically.  Echo with EF 40-45%, and metastatic colon cancer with pulmonary and hepatic mets admitted 12/1 with chest pain and shortness of breath.  Despite recurrent persistent chest pain dating back to 9/21 GI sign of CCM cardiology troponins were negative.  Chest x-ray however showed interval pneumonia-- recurrent problems with angina  12/2 hsTn  50>>5900, Tx with IV heparin 12/3 atrial fib -RVR complicated by hypotension respiratory failure and>>> intubation  T 101    Subjective   Intubated but denies pain and breathing is better; nods off Family at bedside   Inpatient Medications    Scheduled Meds: . aspirin  81 mg Per Tube Daily  . chlorhexidine gluconate (MEDLINE KIT)  15 mL Mouth Rinse BID  . Chlorhexidine Gluconate Cloth  6 each Topical Daily  . clopidogrel  75 mg Per Tube Daily  . docusate  100 mg Per Tube BID  . feeding supplement (PROSource TF)  90 mL Per Tube TID  . guaiFENesin  20 mL Per Tube Q8H  . icosapent Ethyl  2 g Oral BID  . insulin aspart  0-20 Units Subcutaneous Q4H  . insulin aspart  4 Units Subcutaneous Q4H  . insulin detemir  40 Units Subcutaneous QHS  . levalbuterol  0.63 mg Nebulization Q6H  . mouth rinse  15 mL Mouth Rinse 10 times per day  . metoprolol tartrate  12.5 mg Per Tube Q8H  . multivitamin with minerals  1 tablet Per Tube Daily  . pantoprazole sodium  40 mg Per Tube Daily  . polyethylene glycol  17 g Per Tube Daily  . ramipril  10 mg Per Tube Daily  . ranolazine  500 mg Oral BID  . rosuvastatin  40 mg Per Tube QHS   Continuous Infusions: . sodium chloride Stopped (10/19/20 2328)  . sodium chloride Stopped (10/21/20 1418)  . amiodarone 30 mg/hr (10/22/20 0900)   . azithromycin Stopped (10/21/20 2304)  . cefTRIAXone (ROCEPHIN)  IV Stopped (10/21/20 2203)  . dexmedetomidine (PRECEDEX) IV infusion 0.6 mcg/kg/hr (10/22/20 0900)  . dextrose 5% lactated ringers Stopped (10/21/20 0655)  . feeding supplement (VITAL 1.5 CAL) 50 mL/hr at 10/22/20 0400  . fentaNYL infusion INTRAVENOUS 50 mcg/hr (10/22/20 0900)  . heparin 1,350 Units/hr (10/22/20 0900)  . nitroGLYCERIN Stopped (10/21/20 0839)  . norepinephrine (LEVOPHED) Adult infusion 6 mcg/min (10/22/20 0900)  . phenylephrine (NEO-SYNEPHRINE) Adult infusion Stopped (10/21/20 1626)   PRN Meds: acetaminophen (TYLENOL) oral liquid 160 mg/5 mL, dextrose, fentaNYL, levalbuterol, ondansetron (ZOFRAN) IV, sodium chloride flush   Vital Signs    Vitals:   10/22/20 0845 10/22/20 0900 10/22/20 0915 10/22/20 0930  BP:  97/65    Pulse:      Resp: (!) 26 (!) 26 (!) 26 (!) 26  Temp:      TempSrc:      SpO2:      Weight:      Height:        Intake/Output Summary (Last 24 hours) at 10/22/2020 0959 Last data filed at 10/22/2020 0900 Gross per 24 hour  Intake 4736.76 ml  Output 683 ml  Net 4053.76 ml   Last 3 Weights 10/19/2020 10/19/2020 10/18/2020  Weight (lbs) 177 lb 181 lb 9.6 oz 177  lb  Weight (kg) 80.287 kg 82.373 kg 80.287 kg      Telemetry    sinus - Personally Reviewed  ECG       Physical Exam  Well developed and nourished intubated HENT normal Neck supple with JVP-  Clear Regular rate and rhythm, no murmurs or gallops Abd-soft with active BS No Clubbing cyanosis edema Skin-warm and dry A and then nods off   Grossly normal sensory and motor function     Labs    High Sensitivity Troponin:   Recent Labs  Lab 10/18/20 0438 10/19/20 2146 10/20/20 0121 10/20/20 0324 10/20/20 0608  TROPONINIHS 44* 51* 1,386* 4,619* 5,929*      Chemistry Recent Labs  Lab 10/18/20 0214 10/19/20 0940 10/19/20 2146 10/20/20 0324 10/20/20 2220 10/20/20 2220 10/21/20 0252 10/21/20 1609  10/22/20 0410  NA   < > 135 133*   < > 133*   < > 134* 134* 133*  K   < > 4.4 4.3   < > 3.1*   < > 3.5 4.9 3.8  CL   < > 98 97*   < > 98  --  99  --  101  CO2   < > 24 19*   < > 23  --  22  --  19*  GLUCOSE   < > 425* 431*   < > 221*  --  160*  --  352*  BUN   < > 25 24*   < > 30*  --  31*  --  52*  CREATININE   < > 0.88 1.30*   < > 1.23  --  1.31*  --  2.48*  CALCIUM   < > 9.4 9.2   < > 8.4*  --  8.3*  --  7.8*  PROT  --   --  6.8  --   --   --   --   --  5.3*  ALBUMIN  --   --  3.3*  --   --   --   --   --  2.0*  AST  --   --  24  --   --   --   --   --  259*  ALT  --   --  25  --   --   --   --   --  170*  ALKPHOS  --   --  118  --   --   --   --   --  174*  BILITOT  --   --  1.2  --   --   --   --   --  0.4  GFRNONAA   < > 91 >60   < > >60  --  >60  --  28*  GFRAA  --  106  --   --   --   --   --   --   --   ANIONGAP   < >  --  17*   < > 12  --  13  --  13   < > = values in this interval not displayed.     Hematology Recent Labs  Lab 10/20/20 0324 10/20/20 0324 10/21/20 0252 10/21/20 1609 10/22/20 0410  WBC 19.4*  --  16.1*  --  15.4*  RBC 4.10*  --  3.42*  --  3.77*  HGB 10.9*   < > 8.8* 9.9* 9.5*  HCT 33.5*   < > 28.1* 29.0*  30.0*  MCV 81.7  --  82.2  --  79.6*  MCH 26.6  --  25.7*  --  25.2*  MCHC 32.5  --  31.3  --  31.7  RDW 15.5  --  15.8*  --  16.0*  PLT 335  --  258  --  312   < > = values in this interval not displayed.    BNP Recent Labs  Lab 10/19/20 2146 10/22/20 0410  BNP 688.7* 1,460.1*     DDimer No results for input(s): DDIMER in the last 168 hours.   Radiology    DG Chest 1 View  Result Date: 10/21/2020 CLINICAL DATA:  64 year old male with central line placement. EXAM: ABDOMEN - 1 VIEW; CHEST  1 VIEW COMPARISON:  Earlier chest radiograph dated 10/21/2020. FINDINGS: Endotracheal tube with poorly visualized tip but appears above the carina. Enteric tube extends below the diaphragm with tip likely in the distal stomach. Right IJ central  venous line with tip close to the cavoatrial junction. Left subclavian central venous catheter with tip over central SVC. Bilateral confluent pulmonary opacities as seen previously consistent with multifocal pneumonia. There are bilateral pleural effusions, right greater than left. No pneumothorax. Stable cardiomegaly. Atherosclerotic calcification of the aorta. Median sternotomy wires and CABG vascular clips. No acute osseous pathology. Coronary vascular calcification. IMPRESSION: 1. Interval placement of right IJ central venous line with tip over central SVC. No pneumothorax. 2. Endotracheal tube above the carina and enteric tube in the distal stomach. 3. Bilateral confluent pulmonary opacities as seen previously consistent with multifocal pneumonia. Bilateral pleural effusions, right greater than left. Electronically Signed   By: Anner Crete M.D.   On: 10/21/2020 16:39   DG Abd 1 View  Result Date: 10/21/2020 CLINICAL DATA:  64 year old male with central line placement. EXAM: ABDOMEN - 1 VIEW; CHEST  1 VIEW COMPARISON:  Earlier chest radiograph dated 10/21/2020. FINDINGS: Endotracheal tube with poorly visualized tip but appears above the carina. Enteric tube extends below the diaphragm with tip likely in the distal stomach. Right IJ central venous line with tip close to the cavoatrial junction. Left subclavian central venous catheter with tip over central SVC. Bilateral confluent pulmonary opacities as seen previously consistent with multifocal pneumonia. There are bilateral pleural effusions, right greater than left. No pneumothorax. Stable cardiomegaly. Atherosclerotic calcification of the aorta. Median sternotomy wires and CABG vascular clips. No acute osseous pathology. Coronary vascular calcification. IMPRESSION: 1. Interval placement of right IJ central venous line with tip over central SVC. No pneumothorax. 2. Endotracheal tube above the carina and enteric tube in the distal stomach. 3. Bilateral  confluent pulmonary opacities as seen previously consistent with multifocal pneumonia. Bilateral pleural effusions, right greater than left. Electronically Signed   By: Anner Crete M.D.   On: 10/21/2020 16:39   DG CHEST PORT 1 VIEW  Result Date: 10/22/2020 CLINICAL DATA:  Follow-up pneumonia EXAM: PORTABLE CHEST 1 VIEW COMPARISON:  10/21/2020 FINDINGS: Cardiac shadow is stable. Aortic calcifications are again seen. Left chest wall port, endotracheal tube and gastric catheter are noted and stable. Right jugular catheter is again noted and stable as well. Patchy bilateral infiltrates are again seen and stable. Some slight increase of central vascular congestion is noted. IMPRESSION: Increased vascular congestion superimposed over bilateral airspace opacities. Electronically Signed   By: Inez Catalina M.D.   On: 10/22/2020 09:17   DG CHEST PORT 1 VIEW  Result Date: 10/21/2020 CLINICAL DATA:  Hypoxia.  Respiratory distress. EXAM: PORTABLE CHEST 1 VIEW  COMPARISON:  One-view chest x-ray 10/20/2020 FINDINGS: The heart is enlarged. Atherosclerotic calcifications are again noted at the aortic arch. Left subclavian Port-A-Cath is stable. Right lower lobe and upper lobe airspace opacities are present. Increasing interstitial and airspace opacities are present on the left. Progressive right greater than left pleural effusions are present. IMPRESSION: 1. Progressive bilateral interstitial and airspace disease, right greater than left. This is concerning for pneumonia. 2. Progressive bilateral pleural effusions, right greater than left. 3. Stable cardiomegaly. Electronically Signed   By: San Morelle M.D.   On: 10/21/2020 09:25    Cardiac Studies   Heart cath 01/04/20: 1.  Severe multivessel coronary artery disease with chronic total occlusion of the LAD and RCA, continued patency of the left mainstem and left circumflex stents with focal mild to moderate in-stent restenosis at the transition of the left  main into the circumflex. 2.  Status post aortocoronary bypass surgery with continued patency of the LIMA to LAD graft and severe aorto ostial stenosis of the radial graft to PDA treated successfully with PCI using a 3.0 x 30 mm resolute Onyx DES with intravascular ultrasound guidance 3.  Nonobstructive ostial left circumflex restenosis as demonstrated by intravascular ultrasound with a minimal lumen area of 5.96 mm  Recommendations: Long-term dual antiplatelet therapy with aspirin and Effient, continued aggressive medical therapy, 2D echocardiogram for assessment of LV systolic function.   Echo 09/30/20: 1. Left ventricular ejection fraction, by estimation, is 40 to 45%. The  left ventricle has mildly decreased function. The left ventricle  demonstrates global hypokinesis. Left ventricular diastolic parameters are  consistent with Grade II diastolic  dysfunction (pseudonormalization).  2. Right ventricular systolic function is normal. The right ventricular  size is normal. There is normal pulmonary artery systolic pressure. The  estimated right ventricular systolic pressure is 49.7 mmHg.  3. Left atrial size was moderately dilated.  4. The mitral valve is normal in structure. Mild mitral valve  regurgitation. No evidence of mitral stenosis.  5. The aortic valve is tricuspid. Aortic valve regurgitation is not  visualized. Mild aortic valve stenosis. Aortic valve mean gradient  measures 11.0 mmHg.  6. The inferior vena cava is dilated in size with >50% respiratory  variability, suggesting right atrial pressure of 8 mmHg.  7. Technically difficult study with very poor images even with Definity.  For more accurate EF determination, would consider cardiac MR.     Assessment & Plan    Pneumonia respiratory failure  Atrial fibrillation-RVR complicated by hypotension  Coronary artery disease with prior CABG severe residual native/graft disease  Congestive heart  failure-chronic/acute-systolic/diastolic  Renal insufficiency acute/chronic creatinine now 2.5  Metastatic colon cancer with pulmonary hepatic metastases   Intubated and currently stable respiratory wise  Reverted to sinus rhythm.  Holding.  On amiodarone.  Interval elevation of troponin.  Question cause possibly demand in the setting of severe  coronary disease  Renal insufficiency likely secondary to shock respiratory failure yesterday We will hold ramipril     For questions or updates, please contact [place] Please consult [email or web address] for contact info under        Signed, Virl Axe, MD  10/22/2020, 9:59 AM

## 2020-10-22 NOTE — Consult Note (Signed)
Palliative Medicine  Name: MARISSA LOWREY Date: 10/22/2020 MRN: [id number]  DOB: 1956/03/30  Patient Care Team: Sharilyn Sites, MD as PCP - General (Family Medicine) Sherren Mocha, MD as PCP - Cardiology (Cardiology) Danie Binder, MD (Inactive) as Consulting Physician (Gastroenterology)    REASON FOR CONSULTATION: Ian [name] is a 64 y.o. male with multiple medical problems including CAD status post CABG ischemic cardiomyopathy with EF of 40 to 45%, PAD status post right femoropopliteal bypass, paroxysmal AF, and stage IV colorectal cancer metastatic to liver (initially diagnosed (08/2018) status post neoadjuvant FOLFIRI chemotherapy and partial colectomy.  Patient was lost to oncology follow-up. Patient was hospitalized 09/30/2020-10/01/2020 with unstable angina. CT of abdomen and pelvis (11/10) and CT of the chest (11/18) revealed multiple new liver and pulmonary metastases.  Patient was again seen by outpatient medical oncology and plan was to resume FOLFIRI and bevacizumab.  Patient is now readmitted 10/20/2020 with unstable angina and pneumonia.  He developed hypoxic respiratory failure requiring intubation.  Palliative care is consulted to address goals.  SOCIAL HISTORY:     reports that he has never smoked. He has never used smokeless tobacco. He reports that he does not drink alcohol and does not use drugs.  Patient is married to his wife for 47 years.  He lives at home with wife and son.  Patient has 3 sons.  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  ADVANCE DIRECTIVES:  Not on file  CODE STATUS: Full code  PAST MEDICAL HISTORY: Past Medical History:  Diagnosis Date  . 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 Colon cancer ([place])   . Complication of anesthesia    atypical pseudo-cholinesterase deficiency  . Coronary artery disease 01/10/2013   s/p CABG 2008 // s/p STEMI in 08/2019>>DES x 2 to L radial-PDA; staged PCI: DES  x 2 to protected LM into LCx // s/p inf-lat STEMI 12/2019 >> DES to L Radial-PDA  . Diabetes mellitus   . Family Hx of adverse reaction to anesthesia    sister also has atypical pseudo-cholinesterase deficiency  . Gastroesophageal reflux disease   . History of heart bypass surgery   . HLD (hyperlipidemia)   . Hypertension   . Ischemic cardiomyopathy    Echo 01/04/2020: EF 45-50, mild LVH, Gr 2 DD, normal RVSF, mild to mod LAE, mild MR, mild AS (mean gradient 6 mmHg)  . Mild aortic stenosis   . Mild mitral regurgitation   . Myocardial infarction ([place])   . PAD (peripheral artery disease) ([facility])    s/p R fem-pop bypass 02/2020 (Dr. Donnetta Hutching)  . Paroxysmal atrial fibrillation ([facility])    CHADS-VASc=3 // noted post op after fem pop bypass in 02/2020; due to high risk, Apixaban started    PAST SURGICAL HISTORY:  Past Surgical History:  Procedure Laterality Date  . ABDOMINAL AORTOGRAM W/LOWER EXTREMITY Bilateral 02/18/2020   Procedure: ABDOMINAL AORTOGRAM W/LOWER EXTREMITY;  Surgeon: Marty Heck, MD;  Location: [facility];  Service: Cardiovascular;  Laterality: Bilateral;  . COLONOSCOPY N/A 08/29/2018   Procedure: COLONOSCOPY;  Surgeon: Daneil Dolin, MD;  [facility];  Service: Endoscopy;  Laterality: N/A;  9:30  . CORONARY ARTERY BYPASS GRAFT     4 vessels  . CORONARY ATHERECTOMY N/A 09/15/2019   Procedure: CORONARY ATHERECTOMY;  Surgeon: Sherren Mocha, MD;  Location: [facility];  Service: Cardiovascular;  LM-LCx Atherectomy - DES PCI (*Protected LM)   .  CORONARY STENT INTERVENTION N/A 09/15/2019   Procedure: CORONARY STENT INTERVENTION;  Surgeon: Sherren Mocha, MD;  Location: [facility];; Successful orbital atherectomy, PTCA, and overlapping DES treatment of severe calcific stenosis in the left main and left circumflex using a 2.5x34 mm Resolute Onyx DES (left circumflex) and 3.0x30 mm Resolute Onyx DES (overlapped proximally in the LCx extending into  left main)  . CORONARY STENT INTERVENTION N/A 01/04/2020   Procedure: CORONARY STENT INTERVENTION;  Surgeon: Sherren Mocha, MD;  Location: [facility];  Service: Cardiovascular;  Laterality: N/A;  . CORONARY/GRAFT ACUTE MI REVASCULARIZATION N/A 09/14/2019   Procedure: Coronary/Graft Acute MI Revascularization;  Surgeon: Sherren Mocha, MD;  Location: [facility];  Service: Cardiovascular;  Laterality: N/A;  . FEMORAL-POPLITEAL BYPASS GRAFT Right 02/25/2020   Procedure: FEMORAL-ABOVE KNEE POPLITEAL ARTERY BYPASS GRAFT with Saphenous vein;  Surgeon: Rosetta Posner, MD;  Location: [facility];  Service: Vascular;  Laterality: Right;  . heart bypass    . INSERTION OF MESH  02/07/2015   Procedure: INSERTION OF MESH;  Surgeon: Aviva Signs Md, MD;  Location: [facility];  Service: General;;  . INTRAVASCULAR ULTRASOUND/IVUS N/A 01/04/2020   Procedure: Intravascular Ultrasound/IVUS;  Surgeon: Sherren Mocha, MD;  Location: [facility];  Service: Cardiovascular;  Laterality: N/A;  . LEFT HEART CATH AND CORONARY ANGIOGRAPHY N/A 09/14/2019   Procedure: LEFT HEART CATH AND CORONARY ANGIOGRAPHY;  Surgeon: Sherren Mocha, MD;  Location: [facility];  Service: Cardiovascular;  Laterality: N/A;  . LEFT HEART CATH AND CORONARY ANGIOGRAPHY N/A 01/04/2020   Procedure: LEFT HEART CATH AND CORONARY ANGIOGRAPHY;  Surgeon: Sherren Mocha, MD;  Location: [facility];  Service: Cardiovascular;  Laterality: N/A;  . PARTIAL COLECTOMY N/A 09/17/2018   Procedure: PARTIAL COLECTOMY WITH PARTIAL WEDGE RESECTION LIVER METASTASIS;  Surgeon: Aviva Signs, MD;  Location: [facility];  Service: General;  Laterality: N/A;  . POLYPECTOMY  08/29/2018   Procedure: POLYPECTOMY;  Surgeon: Daneil Dolin, MD;  [facility];  Service: Endoscopy;;  . PORTACATH PLACEMENT Left 10/10/2018   Procedure: INSERTION PORT-A-CATH (catheter in left subclavian);  Surgeon: Aviva Signs, MD;  Location: [facility];  Service:  General;  Laterality: Left;  . UMBILICAL HERNIA REPAIR N/A 02/07/2015   Procedure: UMBILICAL HERNIORRHAPHY WITH MESH;  Surgeon: Aviva Signs Md, MD;  Location: [facility];  Service: General;  Laterality: N/A;    HEMATOLOGY/ONCOLOGY HISTORY:  Oncology History  Malignant neoplasm of sigmoid colon ([place])  09/17/2018 Initial Diagnosis   Malignant neoplasm of sigmoid colon ([place])   11/03/2018 -  Chemotherapy   The patient had palonosetron (ALOXI) injection 0.25 mg, 0.25 mg, Intravenous,  Once, 10 of 12 cycles Administration: 0.25 mg (11/03/2018), 0.25 mg (11/18/2018), 0.25 mg (12/01/2018), 0.25 mg (12/15/2018), 0.25 mg (12/30/2018), 0.25 mg (01/13/2019), 0.25 mg (01/27/2019), 0.25 mg (02/10/2019), 0.25 mg (02/24/2019), 0.25 mg (03/10/2019) bevacizumab (AVASTIN) 450 mg in sodium chloride 0.9 % 100 mL chemo infusion, 5 mg/kg = 450 mg, Intravenous,  Once, 10 of 10 cycles Administration: 450 mg (11/03/2018), 450 mg (11/18/2018), 450 mg (12/01/2018), 450 mg (12/15/2018), 450 mg (01/13/2019), 450 mg (01/27/2019), 450 mg (02/10/2019), 450 mg (02/24/2019), 450 mg (03/10/2019) irinotecan (CAMPTOSAR) 360 mg in sodium chloride 0.9 % 500 mL chemo infusion, 180 mg/m2 = 360 mg, Intravenous,  Once, 10 of 12 cycles Administration: 360 mg (11/03/2018), 360 mg (11/18/2018), 360 mg (12/01/2018), 360 mg (12/15/2018), 360 mg (12/30/2018), 360 mg (01/13/2019), 360 mg (01/27/2019), 360 mg (02/10/2019), 360 mg (02/24/2019),  360 mg (03/10/2019) leucovorin 800 mg in sodium chloride 0.9 % 250 mL infusion, 808 mg, Intravenous,  Once, 10 of 12 cycles Dose modification: 400 mg/m2 (original dose 400 mg/m2, Cycle 11, Reason: Provider Judgment) Administration: 800 mg (11/03/2018), 800 mg (11/18/2018), 800 mg (12/01/2018), 800 mg (12/15/2018), 800 mg (12/30/2018), 800 mg (01/13/2019), 800 mg (01/27/2019), 800 mg (02/10/2019), 800 mg (02/24/2019), 800 mg (03/10/2019) fosaprepitant (EMEND) 150 mg in sodium chloride 0.9 % 145 mL IVPB, 150 mg, Intravenous,  Once, 1 of 1  cycle Administration: 150 mg (12/01/2018) fluorouracil (ADRUCIL) chemo injection 800 mg, 400 mg/m2 = 800 mg, Intravenous,  Once, 10 of 12 cycles Administration: 800 mg (11/03/2018), 800 mg (11/18/2018), 800 mg (12/01/2018), 800 mg (12/15/2018), 800 mg (12/30/2018), 800 mg (01/13/2019), 800 mg (01/27/2019), 800 mg (02/10/2019), 800 mg (02/24/2019), 800 mg (03/10/2019) fosaprepitant (EMEND) 150 mg, dexamethasone (DECADRON) 10 mg in sodium chloride 0.9 % 145 mL IVPB, , Intravenous,  Once, 7 of 9 cycles Administration:  (12/15/2018),  (12/30/2018),  (01/13/2019),  (01/27/2019),  (02/10/2019),  (02/24/2019),  (03/10/2019) fluorouracil (ADRUCIL) 4,850 mg in sodium chloride 0.9 % 53 mL chemo infusion, 2,400 mg/m2 = 4,850 mg, Intravenous, 1 Day/Dose, 10 of 12 cycles Administration: 4,850 mg (11/03/2018), 4,850 mg (11/18/2018), 4,850 mg (12/01/2018), 4,850 mg (12/15/2018), 4,850 mg (12/30/2018), 4,850 mg (01/13/2019), 4,850 mg (01/27/2019), 4,850 mg (02/10/2019), 5,000 mg (02/24/2019), 5,000 mg (03/10/2019) bevacizumab-bvzr (ZIRABEV) 400 mg in sodium chloride 0.9 % 100 mL chemo infusion, 5 mg/kg = 400 mg (original dose ), Intravenous,  Once, 0 of 2 cycles Dose modification: 5 mg/kg (Cycle 11)  for chemotherapy treatment.      ALLERGIES:  is allergic to anectine [succinylcholine] and nsaids.  MEDICATIONS:  Current Facility-Administered Medications  Medication Dose Route Frequency Provider Last Rate Last Admin  . 0.9 %  sodium chloride infusion   Intravenous Continuous Lucrezia Starch, MD   Paused at 10/19/20 2328  . 0.9 %  sodium chloride infusion   Intravenous Continuous Elbert Ewings, MD   Stopped at 10/21/20 1418  . acetaminophen (TYLENOL) 160 MG/5ML solution 650 mg  650 mg Per Tube Q4H PRN Candee Furbish, MD   650 mg at 10/22/20 0114  . amiodarone (NEXTERONE PREMIX) 360-4.14 MG/200ML-% (1.8 mg/mL) IV infusion  30 mg/hr Intravenous Continuous Jettie Booze, MD 16.67 mL/hr at 10/22/20 1500 30 mg/hr at 10/22/20  1500  . aspirin chewable tablet 81 mg  81 mg Per Tube Daily Candee Furbish, MD   81 mg at 10/22/20 0836  . azithromycin (ZITHROMAX) 500 mg in sodium chloride 0.9 % 250 mL IVPB  500 mg Intravenous QHS Norval Morton, MD   Stopped at 10/21/20 2304  . cefTRIAXone (ROCEPHIN) 2 g in sodium chloride 0.9 % 100 mL IVPB  2 g Intravenous QHS Norval Morton, MD   Stopped at 10/21/20 2203  . chlorhexidine gluconate (MEDLINE KIT) (PERIDEX) 0.12 % solution 15 mL  15 mL Mouth Rinse BID Candee Furbish, MD   15 mL at 10/22/20 0836  . Chlorhexidine Gluconate Cloth 2 % PADS 6 each  6 each Topical Daily Freada Bergeron, MD   6 each at 10/21/20 2146  . clopidogrel (PLAVIX) tablet 75 mg  75 mg Per Tube Daily Candee Furbish, MD   75 mg at 10/22/20 1478  . dexmedetomidine (PRECEDEX) 400 MCG/100ML (4 mcg/mL) infusion  0-1.2 mcg/kg/hr Intravenous Continuous Magdalen Spatz, NP 12.05 mL/hr at 10/22/20 1500 0.6 mcg/kg/hr at 10/22/20 1500  . dextrose 5 %  in lactated ringers infusion   Intravenous Continuous Norval Morton, MD   Stopped at 10/21/20 [phone]  . dextrose 50 % solution 0-50 mL  0-50 mL Intravenous PRN Fuller Plan A, MD      . docusate (COLACE) 50 MG/5ML liquid 100 mg  100 mg Per Tube BID Magdalen Spatz, NP   100 mg at 10/22/20 0837  . feeding supplement (PROSource TF) liquid 90 mL  90 mL Per Tube TID Candee Furbish, MD   90 mL at 10/22/20 1643  . feeding supplement (VITAL 1.5 CAL) liquid 1,000 mL  1,000 mL Per Tube Continuous Candee Furbish, MD 50 mL/hr at 10/22/20 0400 Rate Verify at 10/22/20 0400  . fentaNYL (SUBLIMAZE) bolus via infusion 50 mcg  50 mcg Intravenous Q15 min PRN Magdalen Spatz, NP      . fentaNYL 2564mg in NS 2528m(1080mml) infusion-PREMIX  50-200 mcg/hr Intravenous Continuous GroMagdalen SpatzP 5 mL/hr at 10/22/20 1500 50 mcg/hr at 10/22/20 1500  . guaiFENesin (ROBITUSSIN) 100 MG/5ML solution 400 mg  20 mL Per Tube Q8H SmiCandee FurbishD   400 mg at 10/22/20 1442  . heparin ADULT  infusion 100 units/mL (25000 units/250m66mdium chloride 0.45%)  1,350 Units/hr Intravenous Continuous MeyeKris MoutonH 13.5 mL/hr at 10/22/20 1500 1,350 Units/hr at 10/22/20 1500  . insulin aspart (novoLOG) injection 0-20 Units  0-20 Units Subcutaneous Q4H SmitCandee Furbish   15 Units at 10/22/20 1640  . insulin aspart (novoLOG) injection 7 Units  7 Units Subcutaneous Q4H SmitCandee Furbish   7 Units at 10/22/20 1640  . insulin detemir (LEVEMIR) injection 30 Units  30 Units Subcutaneous BID SmitCandee Furbish   30 Units at 10/22/20 1216  . levalbuterol (XOPENEX) nebulizer solution 0.63 mg  0.63 mg Nebulization Q6H PembFreada Bergeron   0.63 mg at 10/22/20 1247  . levalbuterol (XOPENEX) nebulizer solution 0.63 mg  0.63 mg Nebulization Q6H PRN GrocMagdalen Spatz      . MEDLINE mouth rinse  15 mL Mouth Rinse 10 times per day SmitCandee Furbish   15 mL at 10/22/20 1641  . multivitamin with minerals tablet 1 tablet  1 tablet Per Tube Daily SmitCandee Furbish   1 tablet at 10/22/20 [id number] 50 mg in dextrose 5 % 250 mL (0.2 mg/mL) infusion  0-200 mcg/min Intravenous Titrated UgowClois Dupes   Paused at 10/21/20 [id number] (LEVOPHED) 4mg 35m250mL 26mix infusion  0-40 mcg/min Intravenous Titrated Smith,Candee Furbish6.3 mL/hr at 10/22/20 1500 7 mcg/min at 10/22/20 1500  . ondansetron (ZOFRAN) injection 4 mg  4 mg Intravenous Q6H PRN Ugowe,Clois Dupes 4 mg at 10/20/20 2159  . pantoprazole sodium (PROTONIX) 40 mg/20 mL oral suspension 40 mg  40 mg Per Tube Daily Smith,Candee Furbish 40 mg at 10/22/20 0836  . phenylephrine (NEOSYNEPHRINE) 10-0.9 MG/250ML-% infusion  0-400 mcg/min Intravenous Titrated Smith,Candee Furbish Paused at 10/21/20 1626  . polyethylene glycol (MIRALAX / GLYCOLAX) packet 17 g  17 g Per Tube Daily Groce,Magdalen Spatz 17 g at 10/22/20 0837  . ranolazine (RANEXA) 12 hr tablet 500 mg  500 mg Oral BID Ugowe,Clois Dupes 500 mg at 10/20/20  2135  . rosuvastatin (CRESTOR) tablet 40 mg  40 mg Per Tube QHS Smith,Candee Furbish  40 mg at 10/21/20 2146  . sodium chloride flush (NS) 0.9 % injection 10-40 mL  10-40 mL Intracatheter PRN Freada Bergeron, MD        VITAL SIGNS: BP 106/64   Pulse (!) 111   Temp 98.8 F (37.1 C) (Oral)   Resp (!) 26   Ht _0  (1.651 m)   Wt 177 lb (80.3 kg)   SpO2 100%   BMI 29.45 kg/m  Filed Weights   10/19/20 2130  Weight: 177 lb (80.3 kg)    Estimated body mass index is 29.45 kg/m as calculated from the following:   Height as of this encounter: _1  (1.651 m).   Weight as of this encounter: 177 lb (80.3 kg).  LABS: CBC:    Component Value Date/Time   WBC 15.4 (H) 10/22/2020 0410   HGB 9.5 (L) 10/22/2020 0410   HGB 10.0 (L) 10/19/2020 0940   HCT 30.0 (L) 10/22/2020 0410   HCT 33.0 (L) 10/19/2020 0940   PLT 312 10/22/2020 0410   PLT 310 10/19/2020 0940   MCV 79.6 (L) 10/22/2020 0410   MCV 83 10/19/2020 0940   NEUTROABS 15.0 (H) 10/19/2020 2146   LYMPHSABS 1.5 10/19/2020 2146   MONOABS 1.4 (H) 10/19/2020 2146   EOSABS 0.0 10/19/2020 2146   BASOSABS 0.1 10/19/2020 2146   Comprehensive Metabolic Panel:    Component Value Date/Time   NA 133 (L) 10/22/2020 0410   NA 135 10/19/2020 0940   K 3.8 10/22/2020 0410   CL 101 10/22/2020 0410   CO2 19 (L) 10/22/2020 0410   BUN 52 (H) 10/22/2020 0410   BUN 25 10/19/2020 0940   CREATININE 2.48 (H) 10/22/2020 0410   GLUCOSE 352 (H) 10/22/2020 0410   CALCIUM 7.8 (L) 10/22/2020 0410   AST 259 (H) 10/22/2020 0410   ALT 170 (H) 10/22/2020 0410   ALKPHOS 174 (H) 10/22/2020 0410   BILITOT 0.4 10/22/2020 0410   BILITOT 0.2 07/15/2020 0826   PROT 5.3 (L) 10/22/2020 0410   PROT 7.0 07/15/2020 0826   ALBUMIN 2.0 (L) 10/22/2020 0410   ALBUMIN 4.2 07/15/2020 0826    RADIOGRAPHIC STUDIES: DG Chest 1 View  Result Date: 10/21/2020 CLINICAL DATA:  64 year old male with central line placement. EXAM: ABDOMEN - 1 VIEW; CHEST  1 VIEW  COMPARISON:  Earlier chest radiograph dated 10/21/2020. FINDINGS: Endotracheal tube with poorly visualized tip but appears above the carina. Enteric tube extends below the diaphragm with tip likely in the distal stomach. Right IJ central venous line with tip close to the cavoatrial junction. Left subclavian central venous catheter with tip over central SVC. Bilateral confluent pulmonary opacities as seen previously consistent with multifocal pneumonia. There are bilateral pleural effusions, right greater than left. No pneumothorax. Stable cardiomegaly. Atherosclerotic calcification of the aorta. Median sternotomy wires and CABG vascular clips. No acute osseous pathology. Coronary vascular calcification. IMPRESSION: 1. Interval placement of right IJ central venous line with tip over central SVC. No pneumothorax. 2. Endotracheal tube above the carina and enteric tube in the distal stomach. 3. Bilateral confluent pulmonary opacities as seen previously consistent with multifocal pneumonia. Bilateral pleural effusions, right greater than left. Electronically Signed   By: Anner Crete M.D.   On: 10/21/2020 16:39   DG Chest 2 View  Result Date: 10/18/2020 CLINICAL DATA:  Chest pain EXAM: CHEST - 2 VIEW COMPARISON:  09/30/2020 FINDINGS: The lungs are symmetrically expanded and are clear. No pneumothorax or pleural effusion. Coronary artery bypass grafting has been performed.  Cardiac size is within normal limits. Left subclavian chest port tip is seen within the superior vena cava. Pulmonary vascularity is normal. No acute bone abnormality. IMPRESSION: No active cardiopulmonary disease. Electronically Signed   By: Fidela Salisbury MD   On: 10/18/2020 02:38   DG Chest 2 View  Result Date: 09/30/2020 CLINICAL DATA:  Chest pain EXAM: CHEST - 2 VIEW COMPARISON:  01/03/2019 FINDINGS: Cardiac shadow is within normal limits. Postoperative changes are seen. Left chest wall port is again noted and stable. Aortic  calcifications are seen. The lungs are well aerated bilaterally. No focal infiltrate or sizable effusion is seen. No acute bony abnormality is noted. IMPRESSION: No acute abnormality noted. Electronically Signed   By: Inez Catalina M.D.   On: 09/30/2020 10:27   DG Abd 1 View  Result Date: 10/21/2020 CLINICAL DATA:  64 year old male with central line placement. EXAM: ABDOMEN - 1 VIEW; CHEST  1 VIEW COMPARISON:  Earlier chest radiograph dated 10/21/2020. FINDINGS: Endotracheal tube with poorly visualized tip but appears above the carina. Enteric tube extends below the diaphragm with tip likely in the distal stomach. Right IJ central venous line with tip close to the cavoatrial junction. Left subclavian central venous catheter with tip over central SVC. Bilateral confluent pulmonary opacities as seen previously consistent with multifocal pneumonia. There are bilateral pleural effusions, right greater than left. No pneumothorax. Stable cardiomegaly. Atherosclerotic calcification of the aorta. Median sternotomy wires and CABG vascular clips. No acute osseous pathology. Coronary vascular calcification. IMPRESSION: 1. Interval placement of right IJ central venous line with tip over central SVC. No pneumothorax. 2. Endotracheal tube above the carina and enteric tube in the distal stomach. 3. Bilateral confluent pulmonary opacities as seen previously consistent with multifocal pneumonia. Bilateral pleural effusions, right greater than left. Electronically Signed   By: Anner Crete M.D.   On: 10/21/2020 16:39   CT Chest W Contrast  Result Date: 10/08/2020 CLINICAL DATA:  64 year old male with history of colorectal cancer. Follow-up study. EXAM: CT CHEST WITH CONTRAST TECHNIQUE: Multidetector CT imaging of the chest was performed during intravenous contrast administration. CONTRAST:  42m OMNIPAQUE IOHEXOL 300 MG/ML  SOLN COMPARISON:  Chest CT 01/23/2019. CT the abdomen and pelvis 09/28/2020. FINDINGS:  Cardiovascular: Heart size is normal. There is no significant pericardial fluid, thickening or pericardial calcification. There is aortic atherosclerosis, as well as atherosclerosis of the great vessels of the mediastinum and the coronary arteries, including calcified atherosclerotic plaque in the left main, left anterior descending, left circumflex and right coronary arteries. Status post median sternotomy for CABG including LIMA to the LAD. Thickening and calcification of the aortic valve. Mediastinum/Nodes: No pathologically enlarged mediastinal or hilar lymph nodes. Esophagus is unremarkable in appearance. Large nodule in the right lobe of the thyroid gland measuring 3.8 x 2.7 cm (axial image 14 of series 12), similar to the prior examination. Lungs/Pleura: Multiple pulmonary nodules are noted throughout the lungs bilaterally, largest of which is in the left upper lobe (axial image 33 of series 4) measuring up to 1.4 cm in diameter, most compatible with widespread metastatic disease to the lungs. No acute consolidative airspace disease. No pleural effusions. Linear areas of scarring in the lower lobes of the lungs bilaterally. Upper Abdomen: Multiple hypovascular hepatic lesions are noted, suspicious for widespread metastatic disease to the liver, largest of which is in the periphery of segment 8 (axial image 135 of series 2) measuring 3.5 x 3.4 cm. Aortic atherosclerosis. Musculoskeletal: Median sternotomy wires. There are no aggressive appearing  lytic or blastic lesions noted in the visualized portions of the skeleton. IMPRESSION: 1. Widespread metastatic disease to the lungs. 2. Hypovascular lesions scattered throughout the liver, similar to the recent prior CT of the abdomen and pelvis, as above, compatible with metastatic disease. 3. Aortic atherosclerosis, in addition to left main and 3 vessel coronary artery disease. Status post median sternotomy for CABG including LIMA to the LAD. 4. There are  calcifications of the aortic valve. Echocardiographic correlation for evaluation of potential valvular dysfunction may be warranted if clinically indicated. Aortic Atherosclerosis (ICD10-I70.0). Electronically Signed   By: Vinnie Langton M.D.   On: 10/08/2020 09:05   CT ANGIO CHEST PE W OR WO CONTRAST  Result Date: 10/20/2020 CLINICAL DATA:  Chest pain EXAM: CT ANGIOGRAPHY CHEST WITH CONTRAST TECHNIQUE: Multidetector CT imaging of the chest was performed using the standard protocol during bolus administration of intravenous contrast. Multiplanar CT image reconstructions and MIPs were obtained to evaluate the vascular anatomy. CONTRAST:  49m OMNIPAQUE IOHEXOL 350 MG/ML SOLN COMPARISON:  10/06/2020 FINDINGS: Cardiovascular: There is excellent opacification of the pulmonary arterial tree. There is no intraluminal filling defect seen to suggest acute pulmonary embolism. Central pulmonary arteries are of normal caliber. Coronary artery bypass grafting has been performed. Mild left ventricular dilation. Global cardiac size is within normal limits. No pericardial effusion. Moderate atherosclerotic calcification is seen within the thoracic aorta. No aortic aneurysm. Left subclavian chest port tip is seen within the superior vena cava, unchanged. Mediastinum/Nodes: Asymmetric enlargement of the right thyroid lobe with a 4 cm right thyroid nodule is stable since multiple prior examinations. This was noted to be metabolically inactive on prior PET CT examination of 10/30/2018. Interval development of mild shotty right paratracheal and right hilar adenopathy is likely reactive in nature. No frankly pathologic thoracic adenopathy. The esophagus is unremarkable. Lungs/Pleura: There has developed widespread nodular pulmonary infiltrate with dense consolidation within the right middle lobe most in keeping with multifocal pneumonic infiltrate. Previously noted pulmonary nodules are partially obscured by extensive pneumonic  infiltrate. Dominant nodule within the left apex is again noted, compatible with metastatic disease. Mild left basilar atelectasis. Small right pleural effusion. No pneumothorax. No central obstructing lesion. Upper Abdomen: No acute abnormality. Musculoskeletal: No chest wall abnormality. No acute or significant osseous findings. Review of the MIP images confirms the above findings. IMPRESSION: Interval development of extensive multifocal pulmonary infiltrate and dense consolidation of the right middle lobe most in keeping with acute infection. Small associated right parapneumonic effusion. No central obstructing lesion. Multiple bilateral pulmonary nodules are identified, but partially obscured, in keeping with pulmonary metastatic disease. Mild left ventricular dilation. No pulmonary embolism. Aortic Atherosclerosis (ICD10-I70.0). Electronically Signed   By: AFidela SalisburyMD   On: 10/20/2020 04:59   CT ABDOMEN PELVIS W CONTRAST  Result Date: 09/28/2020 CLINICAL DATA:  Side abdominal pain for 2 days, history of colon cancer surgery in 2019, prior chemotherapy; past history coronary artery disease post MI, diabetes mellitus, hypertension, ischemic cardiomyopathy EXAM: CT ABDOMEN AND PELVIS WITH CONTRAST TECHNIQUE: Multidetector CT imaging of the abdomen and pelvis was performed using the standard protocol following bolus administration of intravenous contrast. Sagittal and coronal MPR images reconstructed from axial data set. CONTRAST:  1015mOMNIPAQUE IOHEXOL 300 MG/ML SOLN IV. Dilute oral contrast. COMPARISON:  03/21/2007 FINDINGS: Lower chest: Bibasilar pulmonary nodules highly suspicious for metastatic disease, new, nodules up to 6 mm diameter. Minimal bibasilar atelectasis. Hepatobiliary: Gallbladder unremarkable. No biliary dilatation. Multiple ill-defined hepatic masses consistent with metastatic disease. Largest  lesions measure 3.8 x 3.7 cm anterior RIGHT lobe image 21, 3.3 x 1.9 cm inferior RIGHT  lobe image 30, and 2.7 x 2.1 cm superiorly LEFT lobe image 14. Pancreas: Normal appearance Spleen: Normal appearance Adrenals/Urinary Tract: Adrenal glands, kidneys, and ureters normal appearance. Mild anterior wall bladder wall thickening, question artifact related underdistention though cystitis and less likely tumor not completely excluded. Stomach/Bowel: Few uncomplicated distal colonic diverticula. Prior segmental resection LEFT:. Stomach and bowel loops otherwise normal appearance. Normal appendix. Vascular/Lymphatic: Atherosclerotic calcifications aorta including visceral artery origins, iliac arteries, femoral arteries. Aorta normal caliber. Scattered normal sized mesenteric and retroperitoneal lymph nodes. Reproductive: Unremarkable prostate gland and seminal vesicles Other: Tumor nodules are seen throughout the mid abdomen consistent with peritoneal metastases. Largest of these is 4.0 x 2.7 cm anterior RIGHT mid abdomen image 51. Next largest lesion is in lateral LEFT upper pelvis 2.1 x 1.8 cm. No ascites. No free air. Musculoskeletal: BILATERAL pars defects L5 without listhesis. No definite osseous metastatic lesions identified. IMPRESSION: Multiple new hepatic and pulmonary metastases. Peritoneal based soft tissue nodules in the mid abdomen consistent with metastases. Mild anterior bladder wall thickening, question artifact related underdistention though cystitis and less likely tumor not completely excluded; recommend correlation with urinalysis. Minimal distal colonic diverticulosis without evidence of diverticulitis. Aortic Atherosclerosis (ICD10-I70.0). Findings called to Dr. Gerarda Fraction on 09/28/2020 at 1421 hrs. Electronically Signed   By: Lavonia Dana M.D.   On: 09/28/2020 14:23   DG CHEST PORT 1 VIEW  Result Date: 10/22/2020 CLINICAL DATA:  Follow-up pneumonia EXAM: PORTABLE CHEST 1 VIEW COMPARISON:  10/21/2020 FINDINGS: Cardiac shadow is stable. Aortic calcifications are again seen. Left chest wall  port, endotracheal tube and gastric catheter are noted and stable. Right jugular catheter is again noted and stable as well. Patchy bilateral infiltrates are again seen and stable. Some slight increase of central vascular congestion is noted. IMPRESSION: Increased vascular congestion superimposed over bilateral airspace opacities. Electronically Signed   By: Inez Catalina M.D.   On: 10/22/2020 09:17   DG CHEST PORT 1 VIEW  Result Date: 10/21/2020 CLINICAL DATA:  Hypoxia.  Respiratory distress. EXAM: PORTABLE CHEST 1 VIEW COMPARISON:  One-view chest x-ray 10/20/2020 FINDINGS: The heart is enlarged. Atherosclerotic calcifications are again noted at the aortic arch. Left subclavian Port-A-Cath is stable. Right lower lobe and upper lobe airspace opacities are present. Increasing interstitial and airspace opacities are present on the left. Progressive right greater than left pleural effusions are present. IMPRESSION: 1. Progressive bilateral interstitial and airspace disease, right greater than left. This is concerning for pneumonia. 2. Progressive bilateral pleural effusions, right greater than left. 3. Stable cardiomegaly. Electronically Signed   By: San Morelle M.D.   On: 10/21/2020 09:25   DG CHEST PORT 1 VIEW  Result Date: 10/20/2020 CLINICAL DATA:  Chest pain. EXAM: PORTABLE CHEST 1 VIEW COMPARISON:  October 19, 2020. FINDINGS: Stable cardiomegaly. Status post coronary bypass graft. No pneumothorax is noted. Left subclavian Port-A-Cath is unchanged. Stable right basilar pneumonia or atelectasis is noted. Left lung is unremarkable. Bony thorax is unremarkable. IMPRESSION: Stable right basilar pneumonia or atelectasis. Aortic Atherosclerosis (ICD10-I70.0). Electronically Signed   By: Marijo Conception M.D.   On: 10/20/2020 08:18   DG Chest Port 1 View  Result Date: 10/19/2020 CLINICAL DATA:  Chest pain, dyspnea EXAM: PORTABLE CHEST 1 VIEW COMPARISON:  10/18/2020 FINDINGS: The lungs are symmetrically  expanded. Since the prior examination, there has developed extensive right basilar pulmonary infiltrate, likely related to infection or aspiration.  Patchy pulmonary infiltrate is also developed at the left lung base. Multiple pulmonary nodules are again identified within the left lung, better assessed on prior CT examination of 10/06/2020. No pneumothorax. Left subclavian chest port is seen with its tip within the superior vena cava. Coronary artery bypass grafting has been performed. Cardiac size within normal limits. No acute bone abnormality. IMPRESSION: Interval development of bibasilar pulmonary infiltrate, more severe within the right lung base, infection versus aspiration. Electronically Signed   By: Fidela Salisbury MD   On: 10/19/2020 22:50   ECHOCARDIOGRAM COMPLETE  Result Date: 09/30/2020    ECHOCARDIOGRAM REPORT   Patient Name:   THADDIUS MANES Date of Exam: 09/30/2020 Medical Rec #:  [id number]        Height:       64.0 in Accession #:    [id number]       Weight:       173.0 lb Date of Birth:  08-26-1956        BSA:          1.839 m Patient Age:    89 years         BP:           120/56 mmHg Patient Gender: M                HR:           81 bpm. Exam Location:  Inpatient Procedure: 2D Echo, Cardiac Doppler, Color Doppler and Intracardiac            Opacification Agent Indications:    Elevated Troponin  History:        Patient has prior history of Echocardiogram examinations, most                 recent 01/04/2020. Cardiomyopathy, CAD, Arrythmias:Atrial                 Fibrillation; Risk Factors:Hypertension, Dyslipidemia and                 Diabetes.  Sonographer:    Bernadene Person RDCS Referring Phys: [address] [place]  1. Left ventricular ejection fraction, by estimation, is 40 to 45%. The left ventricle has mildly decreased function. The left ventricle demonstrates global hypokinesis. Left ventricular diastolic parameters are consistent with Grade II diastolic dysfunction  (pseudonormalization).  2. Right ventricular systolic function is normal. The right ventricular size is normal. There is normal pulmonary artery systolic pressure. The estimated right ventricular systolic pressure is 62.9 mmHg.  3. Left atrial size was moderately dilated.  4. The mitral valve is normal in structure. Mild mitral valve regurgitation. No evidence of mitral stenosis.  5. The aortic valve is tricuspid. Aortic valve regurgitation is not visualized. Mild aortic valve stenosis. Aortic valve mean gradient measures 11.0 mmHg.  6. The inferior vena cava is dilated in size with >50% respiratory variability, suggesting right atrial pressure of 8 mmHg.  7. Technically difficult study with very poor images even with Definity. For more accurate EF determination, would consider cardiac MR. FINDINGS  Left Ventricle: Left ventricular ejection fraction, by estimation, is 40 to 45%. The left ventricle has mildly decreased function. The left ventricle demonstrates global hypokinesis. Definity contrast agent was given IV to delineate the left ventricular  endocardial Bocephus Cali. The left ventricular internal cavity size was normal in size. There is no left ventricular hypertrophy. Left ventricular diastolic parameters are consistent with Grade II diastolic dysfunction (pseudonormalization). Right Ventricle: The right ventricular  size is normal. No increase in right ventricular wall thickness. Right ventricular systolic function is normal. There is normal pulmonary artery systolic pressure. The tricuspid regurgitant velocity is 2.59 m/s, and  with an assumed right atrial pressure of 8 mmHg, the estimated right ventricular systolic pressure is 22.0 mmHg. Left Atrium: Left atrial size was moderately dilated. Right Atrium: Right atrial size was normal in size. Pericardium: There is no evidence of pericardial effusion. Mitral Valve: The mitral valve is normal in structure. There is mild calcification of the mitral valve  leaflet(s). Mild mitral annular calcification. Mild mitral valve regurgitation. No evidence of mitral valve stenosis. Tricuspid Valve: The tricuspid valve is normal in structure. Tricuspid valve regurgitation is trivial. Aortic Valve: The aortic valve is tricuspid. Aortic valve regurgitation is not visualized. Mild aortic stenosis is present. Aortic valve mean gradient measures 11.0 mmHg. Aortic valve peak gradient measures 18.1 mmHg. Aortic valve area, by VTI measures 2.46 cm. Pulmonic Valve: The pulmonic valve was normal in structure. Pulmonic valve regurgitation is not visualized. Aorta: The aortic root is normal in size and structure. Venous: The inferior vena cava is dilated in size with greater than 50% respiratory variability, suggesting right atrial pressure of 8 mmHg. IAS/Shunts: No atrial level shunt detected by color flow Doppler.  LEFT VENTRICLE PLAX 2D LVIDd:         4.80 cm     Diastology LVIDs:         3.40 cm     LV e' medial:    5.78 cm/s LV PW:         1.10 cm     LV E/e' medial:  21.8 LV IVS:        1.10 cm     LV e' lateral:   7.53 cm/s LVOT diam:     2.20 cm     LV E/e' lateral: 16.7 LV SV:         111 LV SV Index:   60 LVOT Area:     3.80 cm  LV Volumes (MOD) LV vol d, MOD A2C: 80.2 ml LV vol d, MOD A4C: 93.2 ml LV vol s, MOD A2C: 40.0 ml LV vol s, MOD A4C: 40.2 ml LV SV MOD A2C:     40.2 ml LV SV MOD A4C:     93.2 ml LV SV MOD BP:      46.0 ml RIGHT VENTRICLE RV S prime:     9.39 cm/s RVOT diam:      2.60 cm TAPSE (M-mode): 1.5 cm LEFT ATRIUM             Index       RIGHT ATRIUM           Index LA diam:        4.50 cm 2.45 cm/m  RA Area:     16.90 cm LA Vol (A2C):   81.4 ml 44.25 ml/m RA Volume:   44.40 ml  24.14 ml/m LA Vol (A4C):   72.5 ml 39.41 ml/m LA Biplane Vol: 77.9 ml 42.35 ml/m  AORTIC VALVE                    PULMONIC VALVE AV Area (Vmax):    2.32 cm     RVOT Peak grad: 2 mmHg AV Area (Vmean):   2.40 cm AV Area (VTI):     2.46 cm AV Vmax:           212.50 cm/s AV Vmean:  154.000 cm/s AV VTI:            0.450 m AV Peak Grad:      18.1 mmHg AV Mean Grad:      11.0 mmHg LVOT Vmax:         129.67 cm/s LVOT Vmean:        97.400 cm/s LVOT VTI:          0.292 m LVOT/AV VTI ratio: 0.65  AORTA Ao Root diam: 3.40 cm Ao Asc diam:  2.90 cm MITRAL VALVE                TRICUSPID VALVE MV Area (PHT): 4.80 cm     TR Peak grad:   26.8 mmHg MV Decel Time: 158 msec     TR Vmax:        259.00 cm/s MV E velocity: 126.00 cm/s MV A velocity: 117.00 cm/s  SHUNTS MV E/A ratio:  1.08         Systemic VTI:  0.29 m                             Systemic Diam: 2.20 cm                             Pulmonic VTI:  0.188 m                             Pulmonic Diam: 2.60 cm                             Qp/Qs:         0.90 Loralie Champagne MD Electronically signed by Loralie Champagne MD Signature Date/Time: 09/30/2020/3:59:48 PM    Final    ECHOCARDIOGRAM LIMITED  Result Date: 10/22/2020    ECHOCARDIOGRAM LIMITED REPORT   Patient Name:   YANG RACK Date of Exam: 10/22/2020 Medical Rec #:  [id number]        Height:       65.0 in Accession #:    [id number]       Weight:       177.0 lb Date of Birth:  1956-09-14        BSA:          1.878 m Patient Age:    18 years         BP:           97/65 mmHg Patient Gender: M                HR:           104 bpm. Exam Location:  Inpatient Procedure: 2D Echo and Intracardiac Opacification Agent Indications:    acute coronary syndrome  History:        Patient has prior history of Echocardiogram examinations, most                 recent 09/30/2020. Signs/Symptoms:Chest Pain; Risk                 Factors:Diabetes, Sleep Apnea and Dyslipidemia.  Sonographer:    Johny Chess Referring Phys: [id number] Candee Furbish  Sonographer Comments: Echo performed with patient supine and on artificial respirator. IMPRESSIONS  1. Left ventricular ejection fraction, by estimation, is 30 to 35%. The left ventricle has moderately  decreased function. The left ventricle demonstrates regional wall  motion abnormalities (see scoring diagram/findings for description). There is mild left ventricular hypertrophy. Left ventricular diastolic parameters are consistent with Grade III diastolic dysfunction (restrictive). Elevated left atrial pressure.  2. Right ventricular systolic function is normal. The right ventricular size is normal.  3. The mitral valve is normal in structure. Trivial mitral valve regurgitation.  4. The aortic valve is calcified. There is moderate calcification of the aortic valve. Aortic valve regurgitation is not visualized. Mild aortic valve stenosis. FINDINGS  Left Ventricle: Left ventricular ejection fraction, by estimation, is 30 to 35%. The left ventricle has moderately decreased function. The left ventricle demonstrates regional wall motion abnormalities. Definity contrast agent was given IV to delineate the left ventricular endocardial Nihar Klus. The left ventricular internal cavity size was normal in size. There is mild left ventricular hypertrophy. Left ventricular diastolic parameters are consistent with Grade III diastolic dysfunction (restrictive). Elevated left atrial pressure.  LV Wall Scoring: The entire apex is akinetic. The mid anteroseptal segment, mid anterolateral segment, and mid anterior segment are hypokinetic. The inferior wall, posterior wall, basal anteroseptal segment, basal anterolateral segment, mid inferoseptal segment, basal anterior segment, and basal inferoseptal segment are normal. Right Ventricle: The right ventricular size is normal. Right vetricular wall thickness was not well visualized. Right ventricular systolic function is normal. The tricuspid regurgitant velocity is 2.55 m/s, and with an assumed right atrial pressure of 8 mmHg, the estimated right ventricular systolic pressure is 60.7 mmHg. Left Atrium: Left atrial size was not well visualized. Right Atrium: Right atrial size was not well visualized. Pericardium: There is no evidence of pericardial  effusion. Mitral Valve: The mitral valve is normal in structure. Trivial mitral valve regurgitation. Tricuspid Valve: The tricuspid valve is normal in structure. Tricuspid valve regurgitation is trivial. Aortic Valve: The aortic valve is calcified. There is moderate calcification of the aortic valve. Aortic valve regurgitation is not visualized. Mild aortic stenosis is present. Aorta: The aortic root and ascending aorta are structurally normal, with no evidence of dilitation. IAS/Shunts: The interatrial septum was not well visualized. LEFT VENTRICLE PLAX 2D LVIDd:         4.80 cm Diastology LVIDs:         3.80 cm LV e' medial:    4.46 cm/s LV PW:         1.10 cm LV E/e' medial:  29.8 LV IVS:        0.90 cm LV e' lateral:   4.79 cm/s                        LV E/e' lateral: 27.8  IVC IVC diam: 2.40 cm LEFT ATRIUM         Index LA diam:    4.00 cm 2.13 cm/m  AORTIC VALVE LVOT Vmax:   99.30 cm/s LVOT Vmean:  60.400 cm/s LVOT VTI:    0.127 m  AORTA Ao Asc diam: 3.40 cm MITRAL VALVE                TRICUSPID VALVE MV Area (PHT): 6.12 cm     TR Peak grad:   26.0 mmHg MV Decel Time: 124 msec     TR Vmax:        255.00 cm/s MV E velocity: 133.00 cm/s MV A velocity: 62.40 cm/s   SHUNTS MV E/A ratio:  2.13         Systemic VTI: 0.13 m Oswaldo Milian MD Electronically  signed by Oswaldo Milian MD Signature Date/Time: 10/22/2020/3:52:39 PM    Final     PERFORMANCE STATUS (ECOG) : 4 - Bedbound  Review of Systems Unable to provide  Physical Exam General: Critically ill-appearing Cardiovascular: Tachycardic Pulmonary: clear ant fields Abdomen: soft, nontender, + bowel sounds GU: no suprapubic tenderness Extremities: no edema, no joint deformities Skin: no rashes Neurological: Wakes with stimulation, nods head in response to questions  IMPRESSION: Patient remains in ICU on ventilator and pressors.  I met with patient's son who was at the bedside.  He has a reasonable understanding of patient's current  medical problems and overall poor prognosis.  He says that family has been thinking that patient is likely approaching end-of-life.  He says that patient also has talked to family about the same.  Son says that his primary goal is to ensure the patient does not "suffer."   We discussed code status in detail. Family would want to continue ventilator support for now but son thinks family might decide not to resuscitate (DNR) in the event of cardiac arrest. However, he does not want to make this decision and needs to speak with his mother and brothers.   Although there was a plan to resume chemotherapy, family recognizes that options for chemotherapy can be limited by clinical decline and poor performance status.  It is possible that patient is extubated only to face a situation where treatment options for the cancer are limited and where hospice might be the most appropriate disposition.   Son asked about details surrounding a transition to comfort care although family is clearly not ready to make that decision.   Son says that patient's wife is home recovering after eye surgery. However, she wants to come to the hospital and visit with patient. This was discussed with ICU nursing leadership who approved her visitation.   PLAN: -Continue current scope of treatment -Family considering options for code status -Please allow expanded visitation to allow wife to visit -Will follow   Time Total: 75 minutes  Visit consisted of counseling and education dealing with the complex and emotionally intense issues of symptom management and palliative care in the setting of serious and potentially life-threatening illness.Greater than 50%  of this time was spent counseling and coordinating care related to the above assessment and plan.  Signed by: Altha Harm, PhD, NP-C

## 2020-10-22 NOTE — Progress Notes (Signed)
  Echocardiogram 2D Echocardiogram has been performed.  Ian [name] 10/22/2020, 11:17 AM

## 2020-10-22 NOTE — Progress Notes (Signed)
[place] for Heparin Indication: chest pain/ACS  Allergies  Allergen Reactions  . Anectine [Succinylcholine] Other (See Comments)    Atypical pseudocholinesterase deficiency  . Nsaids Other (See Comments)    Contraindicated (can only have asa 81 mg)    Patient Measurements: Height: 5\' 5"  (165.1 cm) Weight: 80.3 kg (177 lb) IBW/kg (Calculated) : 61.5 Heparin Dosing Weight: 75.9 kg  Vital Signs: Temp: 98.4 F (36.9 C) (12/04 0739) Temp Source: Oral (12/04 0739) BP: 97/65 (12/04 0900) Pulse Rate: 108 (12/04 0830)  Labs: Recent Labs     0000 10/19/20 2146 10/19/20 2146 10/20/20 0121 10/20/20 0324 10/20/20 0324 10/20/20 0608 10/20/20 1040 10/20/20 1451 10/20/20 1451 10/20/20 2220 10/20/20 2220 10/21/20 0252 10/21/20 0252 10/21/20 1024 10/21/20 1609 10/21/20 1935 10/22/20 0410  HGB  --  11.2*   < >  --  10.9*   < >  --   --   --   --   --   --  8.8*   < >  --  9.9*  --  9.5*  HCT  --  36.0*   < >  --  33.5*   < >  --   --   --   --   --   --  28.1*  --   --  29.0*  --  30.0*  PLT  --  349   < >  --  335  --   --   --   --   --   --   --  258  --   --   --   --  312  APTT   < > 31   < >  --  42*  --   --   --  >200*   < > 49*   < >  --   --  53*  --  83* 91*  LABPROT  --  16.6*  --   --   --   --   --   --   --   --   --   --   --   --   --   --   --   --   INR  --  1.4*  --   --   --   --   --   --   --   --   --   --   --   --   --   --   --   --   HEPARINUNFRC   < >  --   --   --  1.74*  --   --   --  1.70*  --   --   --   --   --  0.99*  --   --  0.91*  CREATININE  --  1.30*   < >  --  1.62*  --   --    < > 1.42*   < > 1.23  --  1.31*  --   --   --   --  2.48*  TROPONINIHS  --  51*   < > 1,386* 1,610*  --  5,929*  --   --   --   --   --   --   --   --   --   --   --    < > = values in this interval not displayed.    Estimated Creatinine Clearance: 29.8 mL/min (A) (  by C-G formula based on SCr of 2.48 mg/dL  (H)).  Assessment: 65 y.o. male admitted with chest pain and history of atrial fibrillation on Xarelto prior to admission. Xarelto is on hold and patient transitioned to IV Heparin. Currently following aPTT values due to effect of recent Xarelto on heparin levels.   APTT is 91 - therapeutic for goal on IV Heparin at 1350 units/hr.  CBC is low but stable at Hgb 9.5/Hct 30. No bleeding reported.   Goal of Therapy:  Heparin level 0.3-0.7 units/mL aPTT 66-102 seconds Monitor platelets by anticoagulation protocol: Yes   Plan:  Continue heparin to 1350 units/hr Daily heparin level, aPTT, CBC -- will continue to use aPTT level until heparin levels are correlating.    [place] Crane, Vena Austria, BCPS, BCCP Clinical Pharmacist  10/22/2020 10:12 AM   [facility] pharmacy phone numbers are listed on [email or web address]

## 2020-10-22 NOTE — Plan of Care (Signed)
  Problem: Clinical Measurements: ?Goal: Diagnostic test results will improve ?Outcome: Progressing ?Goal: Respiratory complications will improve ?Outcome: Progressing ?Goal: Cardiovascular complication will be avoided ?Outcome: Progressing ?  ?Problem: Nutrition: ?Goal: Adequate nutrition will be maintained ?Outcome: Progressing ?  ?Problem: Coping: ?Goal: Level of anxiety will decrease ?Outcome: Progressing ?  ?Problem: Elimination: ?Goal: Will not experience complications related to urinary retention ?Outcome: Progressing ?  ?Problem: Pain Managment: ?Goal: General experience of comfort will improve ?Outcome: Progressing ?  ?

## 2020-10-22 NOTE — Progress Notes (Signed)
Temp is 101 axillary. Elink confirmed regarding tylenol. Tylenol given and ice pack applied. Will continue to assess.

## 2020-10-23 ENCOUNTER — Inpatient Hospital Stay (HOSPITAL_COMMUNITY): Payer: Federal, State, Local not specified - PPO

## 2020-10-23 DIAGNOSIS — N179 Acute kidney failure, unspecified: Secondary | ICD-10-CM

## 2020-10-23 DIAGNOSIS — I509 Heart failure, unspecified: Secondary | ICD-10-CM

## 2020-10-23 DIAGNOSIS — I200 Unstable angina: Secondary | ICD-10-CM | POA: Diagnosis not present

## 2020-10-23 DIAGNOSIS — I214 Non-ST elevation (NSTEMI) myocardial infarction: Secondary | ICD-10-CM | POA: Diagnosis not present

## 2020-10-23 DIAGNOSIS — I480 Paroxysmal atrial fibrillation: Secondary | ICD-10-CM | POA: Diagnosis not present

## 2020-10-23 DIAGNOSIS — N182 Chronic kidney disease, stage 2 (mild): Secondary | ICD-10-CM | POA: Diagnosis not present

## 2020-10-23 LAB — APTT: aPTT: 78 seconds — ABNORMAL HIGH (ref 24–36)

## 2020-10-23 LAB — GLUCOSE, CAPILLARY
Glucose-Capillary: 248 mg/dL — ABNORMAL HIGH (ref 70–99)
Glucose-Capillary: 249 mg/dL — ABNORMAL HIGH (ref 70–99)
Glucose-Capillary: 286 mg/dL — ABNORMAL HIGH (ref 70–99)
Glucose-Capillary: 291 mg/dL — ABNORMAL HIGH (ref 70–99)
Glucose-Capillary: 303 mg/dL — ABNORMAL HIGH (ref 70–99)
Glucose-Capillary: 304 mg/dL — ABNORMAL HIGH (ref 70–99)
Glucose-Capillary: 306 mg/dL — ABNORMAL HIGH (ref 70–99)
Glucose-Capillary: 325 mg/dL — ABNORMAL HIGH (ref 70–99)
Glucose-Capillary: 327 mg/dL — ABNORMAL HIGH (ref 70–99)
Glucose-Capillary: 332 mg/dL — ABNORMAL HIGH (ref 70–99)

## 2020-10-23 LAB — BASIC METABOLIC PANEL
Anion gap: 14 (ref 5–15)
BUN: 76 mg/dL — ABNORMAL HIGH (ref 8–23)
CO2: 18 mmol/L — ABNORMAL LOW (ref 22–32)
Calcium: 7.5 mg/dL — ABNORMAL LOW (ref 8.9–10.3)
Chloride: 102 mmol/L (ref 98–111)
Creatinine, Ser: 3.34 mg/dL — ABNORMAL HIGH (ref 0.61–1.24)
GFR, Estimated: 20 mL/min — ABNORMAL LOW (ref >60)
Glucose, Bld: 336 mg/dL — ABNORMAL HIGH (ref 70–99)
Potassium: 3.3 mmol/L — ABNORMAL LOW (ref 3.5–5.1)
Sodium: 134 mmol/L — ABNORMAL LOW (ref 135–145)

## 2020-10-23 LAB — CBC
HCT: 27.2 % — ABNORMAL LOW (ref 39.0–52.0)
Hemoglobin: 8.7 g/dL — ABNORMAL LOW (ref 13.0–17.0)
MCH: 25.7 pg — ABNORMAL LOW (ref 26.0–34.0)
MCHC: 32 g/dL (ref 30.0–36.0)
MCV: 80.2 fL (ref 80.0–100.0)
Platelets: 284 10*3/uL (ref 150–400)
RBC: 3.39 MIL/uL — ABNORMAL LOW (ref 4.22–5.81)
RDW: 16.2 % — ABNORMAL HIGH (ref 11.5–15.5)
WBC: 13.5 10*3/uL — ABNORMAL HIGH (ref 4.0–10.5)
nRBC: 0 % (ref 0.0–0.2)

## 2020-10-23 LAB — CULTURE, RESPIRATORY W GRAM STAIN
Culture: NO GROWTH
Gram Stain: NONE SEEN

## 2020-10-23 LAB — PHOSPHORUS
Phosphorus: 4 mg/dL (ref 2.5–4.6)
Phosphorus: 4.3 mg/dL (ref 2.5–4.6)

## 2020-10-23 LAB — COOXEMETRY PANEL
Carboxyhemoglobin: 1.1 % (ref 0.5–1.5)
Methemoglobin: 1.3 % (ref 0.0–1.5)
O2 Saturation: 76.3 %
Total hemoglobin: 8.4 g/dL — ABNORMAL LOW (ref 12.0–16.0)

## 2020-10-23 LAB — HEPARIN LEVEL (UNFRACTIONATED): Heparin Unfractionated: 0.48 IU/mL (ref 0.30–0.70)

## 2020-10-23 LAB — MAGNESIUM
Magnesium: 2.6 mg/dL — ABNORMAL HIGH (ref 1.7–2.4)
Magnesium: 3.2 mg/dL — ABNORMAL HIGH (ref 1.7–2.4)

## 2020-10-23 LAB — POTASSIUM: Potassium: 3.7 mmol/L (ref 3.5–5.1)

## 2020-10-23 MED ORDER — DEXTROSE IN LACTATED RINGERS 5 % IV SOLN: INTRAVENOUS | Status: DC

## 2020-10-23 MED ORDER — ALBUMIN HUMAN 25 % IV SOLN
25.0000 g | Freq: Four times a day (QID) | INTRAVENOUS | Status: AC
  Administered 2020-10-23 – 2020-10-24 (×4): 25 g via INTRAVENOUS
  Filled 2020-10-23 (×4): qty 100

## 2020-10-23 MED ORDER — AMIODARONE LOAD VIA INFUSION
150.0000 mg | Freq: Once | INTRAVENOUS | Status: AC
  Administered 2020-10-23: 150 mg via INTRAVENOUS
  Filled 2020-10-23: qty 83.34

## 2020-10-23 MED ORDER — AMIODARONE IV BOLUS ONLY 150 MG/100ML: 150.0000 mg | Freq: Once | INTRAVENOUS | Status: DC

## 2020-10-23 MED ORDER — DIGOXIN 0.25 MG/ML IJ SOLN
0.2500 mg | Freq: Four times a day (QID) | INTRAMUSCULAR | Status: AC
  Administered 2020-10-23 (×2): 0.25 mg via INTRAVENOUS
  Filled 2020-10-23 (×2): qty 2

## 2020-10-23 MED ORDER — INSULIN REGULAR(HUMAN) IN NACL 100-0.9 UT/100ML-% IV SOLN
INTRAVENOUS | Status: DC
  Administered 2020-10-23: 13 [IU]/h via INTRAVENOUS
  Administered 2020-10-23: 11 [IU]/h via INTRAVENOUS
  Filled 2020-10-23 (×3): qty 100

## 2020-10-23 MED ORDER — LACTATED RINGERS IV SOLN: INTRAVENOUS | Status: DC

## 2020-10-23 MED ORDER — POTASSIUM CHLORIDE 10 MEQ/100ML IV SOLN
10.0000 meq | INTRAVENOUS | Status: AC
  Administered 2020-10-23 (×2): 10 meq via INTRAVENOUS
  Filled 2020-10-23 (×2): qty 100

## 2020-10-23 MED ORDER — FUROSEMIDE 10 MG/ML IJ SOLN
40.0000 mg | Freq: Four times a day (QID) | INTRAMUSCULAR | Status: AC
  Administered 2020-10-23 (×2): 40 mg via INTRAVENOUS
  Filled 2020-10-23 (×2): qty 4

## 2020-10-23 MED ORDER — METOPROLOL TARTRATE 5 MG/5ML IV SOLN
5.0000 mg | INTRAVENOUS | Status: DC | PRN
  Administered 2020-10-23 (×2): 5 mg via INTRAVENOUS
  Filled 2020-10-23 (×2): qty 5

## 2020-10-23 MED ORDER — MAGNESIUM SULFATE 2 GM/50ML IV SOLN
2.0000 g | Freq: Once | INTRAVENOUS | Status: AC
  Administered 2020-10-23: 2 g via INTRAVENOUS
  Filled 2020-10-23: qty 50

## 2020-10-23 MED ORDER — DEXTROSE 50 % IV SOLN: 0.0000 mL | INTRAVENOUS | Status: DC | PRN

## 2020-10-23 NOTE — Progress Notes (Signed)
eLink Physician-Brief Progress Note Patient Name: Ian [name] DOB: 06-22-1956 MRN: [id number]   Date of Service  10/23/2020  HPI/Events of Note  K low, Cr elevated. GFR low.  afib.  eICU Interventions  Kcl 10 meq IVPB q1 x 2 doses, follow K level at 10 AM.      Intervention Category Intermediate Interventions: Electrolyte abnormality - evaluation and management  Elmer Sow 10/23/2020, 6:15 AM

## 2020-10-23 NOTE — Plan of Care (Signed)
  Problem: Nutrition: Goal: Adequate nutrition will be maintained Outcome: Progressing   Problem: Coping: Goal: Level of anxiety will decrease Outcome: Progressing   Problem: Elimination: Goal: Will not experience complications related to bowel motility Outcome: Progressing   Problem: Pain Managment: Goal: General experience of comfort will improve Outcome: Progressing   Problem: Clinical Measurements: Goal: Cardiovascular complication will be avoided Outcome: Not Progressing Note: Pt in a fib RVR

## 2020-10-23 NOTE — Consult Note (Signed)
10/22/20  I have seen and evaluated the patient for respiratory failure.  S:  64 year old man with hx of symptomatic CAD s/p CABG and multiple PCIs, ischemic cardiomyopathy, DM2, GERD, HTN, HLD, PAD s/p fempop bypass, paroxysmal Afib, newly found metastatic cancer presenting for persistent chest pain admitted to cardiology service.  Hospital course complicated by respiratory and renal failure.  Remains oliguric, waking up a bit more, comfortable on vent Son and wife at bedside Palliative to meet today  O: No acute distress on vent Follows commands x 4 ext Rhonci on lung auscultation Trace to 1+ LE edema  A:  - Acute hypoxemic respiratory failure secondary to probably aspiration  - Refractory angina, extensive coronary history detailed in Dr. Everlene Other note 12/2. - Stage IV colon cancer with lung and liver mets: was planned to resume chemo before latest setback - AKI on CKD - Anemia - Afib/RVR on amiodarone - DM2 with hyperglycemia  P:  - Continue CAP coverage, f/u culture data - Adjust insulin to BG 140-180 - Continue vent support  - Avoid nephrotoxins, trial of albumin, monitor UoP  Patient critically ill due to respiratory failure, aki Interventions to address this today include mechanical ventilation titration Risk of deterioration without these interventions is high  I personally spent 32 minutes providing critical care not including any separately billable procedures  Erskine Emery MD [facility] Personal pager: [phone] If unanswered, please page CCM On-call: [phone]

## 2020-10-23 NOTE — Progress Notes (Incomplete)
eLink Physician-Brief Progress Note Patient Name: Ian [name] DOB: 06-Sep-1956 MRN: [id number]   Date of Service  10/23/2020  HPI/Events of Note  Notified of patient complaining of chest discomfort. diaphoretic. No increased in pressor requirement. In afib RVR 120s. ETT being displaced and has been repositioned.  eICU Interventions       Intervention Category Major Interventions: Other:  Judd Lien 10/23/2020, 11:36 PM

## 2020-10-23 NOTE — Progress Notes (Signed)
NAME:  TAMIR WALLMAN, MRN:  [id number], DOB:  05-31-1956, LOS: 4 ADMISSION DATE:  10/19/2020, CONSULTATION DATE:  10/21/2020 REFERRING MD: Beau Fanny , CHIEF COMPLAINT:  Airway and vent management   Brief History   64 year old male with history of NSTEMI 09/30/2020, treated with conservative management with increasing nitrates and adding Ranexa.  11/10 he was diagnosed with multiple new hepatic and pulmonary metastatic areas with mild anterior bladder wall thickening.  He was referred to oncology with plan to start chemo next week.  He had an ED visit 11/30, and returned 10/19/2020 due to chest pain is occurring multiple times per day, at rest and with exertion. He was unable to perform any task without angina. He was admitted by the cardiology service. CTA was done which was + for extensive multifocal pulmonary infiltrate and dense consolidation of the right middle lobe most in keeping with acute infection. Small associated right parapneumonic effusion.He was place on BiPAP 10/21/2020 for desats on HFNC.  PCCM have been consulted for high risk for  intubation and vent management.    History of present illness   Mr. Delair is a 64 year old male with hx of CAD s/p CABG 2008, STEMI 08/2019 s/p DES x2 to L radial-PDA with DES x 2 to protected LM into LCx, repeat STEMI 12/2019 s/p DES to L radial-PDA, DM, GERD, HTN, HLD, ICM, PAD s/p R fempop BPG 02/2020, PAF (noted after FPBPG), and colon CA with recent new  dx of new hepatic and pulmonary metastasis (prior mets to liver s/p partial colectomy andchemotherapy).  Admitted for NSTEMI 09/30/2020, treated with conservative management with increasing nitrates and adding Ranexa.  He had an ED visit 11/30, and returned 10/19/2020 due to chest pain is occurring multiple times per day, at rest and with exertion. He was unable to perform any task without angina. He was admitted by the cardiology service. CTA was done which was + for extensive multifocal pulmonary  infiltrate and dense consolidation of the right middle lobe most in keeping with acute infection. Small associated right parapneumonic effusion. He was place on BiPAP 10/21/2020 for desats on HFNC.  PCCM have been consulted for high risk for  intubation and vent management.   Past Medical History    Past Medical History:  Diagnosis Date  . 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 Colon cancer ([place])   . Complication of anesthesia    atypical pseudo-cholinesterase deficiency  . Coronary artery disease 01/10/2013   s/p CABG 2008 // s/p STEMI in 08/2019>>DES x 2 to L radial-PDA; staged PCI: DES x 2 to protected LM into LCx // s/p inf-lat STEMI 12/2019 >> DES to L Radial-PDA  . Diabetes mellitus   . Family Hx of adverse reaction to anesthesia    sister also has atypical pseudo-cholinesterase deficiency  . Gastroesophageal reflux disease   . History of heart bypass surgery   . HLD (hyperlipidemia)   . Hypertension   . Ischemic cardiomyopathy    Echo 01/04/2020: EF 45-50, mild LVH, Gr 2 DD, normal RVSF, mild to mod LAE, mild MR, mild AS (mean gradient 6 mmHg)  . Mild aortic stenosis   . Mild mitral regurgitation   . Myocardial infarction ([place])   . PAD (peripheral artery disease) ([facility])    s/p R fem-pop bypass 02/2020 (Dr. Donnetta Hutching)  . Paroxysmal atrial fibrillation ([facility])    CHADS-VASc=3 // noted post op after fem pop bypass in 02/2020; due to high risk, Apixaban started  Significant Hospital Events   10/19/2020 Admission Recent admission 09/30/2020-10/01/2020 ED Visit 10/18/2020  Consults:  10/21/2020 PCCM  Procedures:  10/21/2020 Intubation  Significant Diagnostic Tests:  CTA  10/20/2020 Interval development of extensive multifocal pulmonary infiltrate and dense consolidation of the right middle lobe most in keeping with acute infection. Small associated right parapneumonic effusion. No central obstructing lesion. Multiple bilateral pulmonary nodules are identified, but  partially obscured, in keeping with pulmonary metastatic disease. Mild left ventricular dilation. No pulmonary embolism. Aortic Atherosclerosis (ICD10-I70.0).  Heart cath 01/04/20: 1. Severe multivessel coronary artery disease with chronic total occlusion of the LAD and RCA, continued patency of the left mainstem and left circumflex stents with focal mild to moderate in-stent restenosis at the transition of the left main into the circumflex. 2. Status post aortocoronary bypass surgery with continued patency of the LIMA to LAD graft and severe aorto ostial stenosis of the radial graft to PDA treated successfully with PCI using a 3.0 x 30 mm resolute Onyx DES with intravascular ultrasound guidance 3. Nonobstructive ostial left circumflex restenosis as demonstrated by intravascular ultrasound with a minimal lumen area of 5.96 mm  Recommendations: Long-term dual antiplatelet therapy with aspirin and Effient, continued aggressive medical therapy, 2D echocardiogram for assessment of LV systolic function.   Echo 09/30/20: 1. Left ventricular ejection fraction, by estimation, is 40 to 45%. The  left ventricle has mildly decreased function. The left ventricle  demonstrates global hypokinesis. Left ventricular diastolic parameters are  consistent with Grade II diastolic  dysfunction (pseudonormalization).  2. Right ventricular systolic function is normal. The right ventricular  size is normal. There is normal pulmonary artery systolic pressure. The  estimated right ventricular systolic pressure is 16.3 mmHg.  3. Left atrial size was moderately dilated.  4. The mitral valve is normal in structure. Mild mitral valve  regurgitation. No evidence of mitral stenosis.  5. The aortic valve is tricuspid. Aortic valve regurgitation is not  visualized. Mild aortic valve stenosis. Aortic valve mean gradient  measures 11.0 mmHg.  6. The inferior vena cava is dilated in size with >50% respiratory   variability, suggesting right atrial pressure of 8 mmHg.  7. Technically difficult study with very poor images even with Definity.  For more accurate EF determination, would consider cardiac MR.   Micro Data:  10/20/2020 Blood Cx: pending 10/19/2020 Covid 19 Negative 10/19/2020 Influenza A&B Negative  Antimicrobials:  Azithromycin 10/21/2020>> Rocephin 10/21/2020>>  Interim history/subjective:  Remains sedated but interactive.  Kidney function not doing well.  Objective   Blood pressure 136/85, pulse (!) 134, temperature 98.8 F (37.1 C), temperature source Oral, resp. rate (!) 26, height _0  (1.651 m), weight 88.6 kg, SpO2 100 %. CVP:  [8 mmHg-17 mmHg] 12 mmHg  Vent Mode: PRVC FiO2 (%):  [40 %] 40 % Set Rate:  [26 bmp] 26 bmp Vt Set:  [600 mL] 600 mL PEEP:  [5 cmH20] 5 cmH20 Plateau Pressure:  [20 cmH20-22 cmH20] 21 cmH20   Intake/Output Summary (Last 24 hours) at 10/23/2020 1505 Last data filed at 10/23/2020 1400 Gross per 24 hour  Intake 3769.28 ml  Output 759 ml  Net 3010.28 ml   Filed Weights   10/19/20 2130 10/23/20 0145  Weight: 80.3 kg 88.6 kg    Examination: Constitutional: no acute distress on vent  Eyes: eomi, pupils equal Ears, nose, mouth, and throat: ETT in place, copious thick secretions Cardiovascular: Irregular, tachycardic, ext warm Respiratory: Scattered rhonci, no accessory muscle use Gastrointestinal: soft, +BS Skin: No rashes,  normal turgor Neurologic: able to move all 4 ext to command Psychiatric: RASS -2  Glucose in 300s SvO2 76% CBC stable CXR with bibasilar infiltrates BAL culture neg to date  Resolved Hospital Problem list     Assessment & Plan:  Acute Respiratory Failure in setting of extensive multifocal pulmonary infiltrates and dense consolidation of the right middle lobe question aspiration.  BAL unrevealing.  On CAP coverage.  Now complicated by fluid overload. - Continue vent support, VAP prevention bundle - CAP coverage as  ordered - Needs to get some fluid off before can consider vent liberation  Atrial Fibrillation RVR on amiodarone - Bolus amio again - Add mag and a couple doses of digoxin - Cardiology following  CAD s/p CABG Severe disease in 3/4 grafts Refractory angina Acute on Chronic systolic HF Cardiorenal +/- ATN  - Current opiate medications should work for angina - Continue levophed, salvage albumin + lasix, family to decide about hemodialysis if renal function continues to deteriorate  Metastatic Colon Cancer with new involvement Lung and Liver Seen by oncology, plan for chemo - Consideration for salvage palliative chemo if/when recovers  Best practice (evaluated daily)   Diet: TF Pain/Anxiety/Delirium protocol (if indicated): Precedex and Fentanyl VAP protocol (if indicated): initiated DVT prophylaxis: Heparin gtt GI prophylaxis: Protonix Glucose control: CBG/ SSI Mobility: BR last date of multidisciplinary goals of care discussion: ongoing daily with nurse in room Code Status: Full Disposition: ICU   Patient critically ill due to respiratory failure Interventions to address this today ventilator titration Risk of deterioration without these interventions is high  I personally spent 32 minutes providing critical care not including any separately billable procedures  Erskine Emery MD [facility] 10/23/2020 4:07 PM Personal pager: [phone] If unanswered, please page CCM On-call: [phone]

## 2020-10-23 NOTE — Progress Notes (Signed)
eLink Physician-Brief Progress Note Patient Name: Ian [name] DOB: 14-Apr-1956 MRN: [id number]   Date of Service  10/23/2020  HPI/Events of Note  Notified of patient complaining of chest discomfort, diaphoretic. No increase in pressor requirement. In afib RVR 120s. On amiodarone and norepinephrine drip. Notified that ETT was displaced and has been repositioned. VAC 26/600/50%/5 PEEP. Peak pressure 24 EKG unchanged  eICU Interventions  Ordered CXR stat CBC, BMP, Trop eLink to be informed once CXR done     Intervention Category Major Interventions: Other:  Judd Lien 10/23/2020, 11:36 PM

## 2020-10-23 NOTE — Progress Notes (Signed)
[facility] for Heparin Indication: chest pain/ACS  Allergies  Allergen Reactions  . Anectine [Succinylcholine] Other (See Comments)    Atypical pseudocholinesterase deficiency  . Nsaids Other (See Comments)    Contraindicated (can only have asa 81 mg)    Patient Measurements: Height: 5\' 5"  (165.1 cm) Weight: 88.6 kg (195 lb 5.2 oz) IBW/kg (Calculated) : 61.5 Heparin Dosing Weight: 75.9 kg  Vital Signs: Temp: 98.8 F (37.1 C) (12/05 1524) Temp Source: Oral (12/05 1524) BP: 136/85 (12/05 1400) Pulse Rate: 134 (12/05 1430)  Labs: Recent Labs    10/20/20 2220 10/21/20 0252 10/21/20 0252 10/21/20 1024 10/21/20 1024 10/21/20 1609 10/21/20 1609 10/21/20 1935 10/22/20 0410 10/23/20 0507  HGB  --  8.8*   < >  --   --  9.9*   < >  --  9.5* 8.7*  HCT  --  28.1*   < >  --   --  29.0*  --   --  30.0* 27.2*  PLT  --  258  --   --   --   --   --   --  312 284  APTT   < >  --   --  53*   < >  --   --  83* 91* 78*  HEPARINUNFRC  --   --   --  0.99*  --   --   --   --  0.91* 0.48  CREATININE  --  1.31*  --   --   --   --   --   --  2.48* 3.34*   < > = values in this interval not displayed.    Estimated Creatinine Clearance: 23.1 mL/min (A) (by C-G formula based on SCr of 3.34 mg/dL (H)).  Assessment: 64 y.o. male admitted with chest pain and history of atrial fibrillation on Xarelto prior to admission. Xarelto is on hold and patient transitioned to IV Heparin. Currently following aPTT values due to effect of recent Xarelto on heparin levels.   APTT is 78, HL 0.48 - both therapeutic for goal on IV Heparin at 1350 units/hr.  CBC is low but stable at Hgb 9.5/Hct 30. No bleeding reported.   Goal of Therapy:  Heparin level 0.3-0.7 units/mL aPTT 66-102 seconds Monitor platelets by anticoagulation protocol: Yes   Plan:  Continue heparin to 1350 units/hr Daily heparin level, aPTT, CBC -- will continue to use aPTT level until heparin levels are  correlating (likely by tomorrow).    [place] Crane, Roylene Reason, BCCP Clinical Pharmacist  10/23/2020 3:39 PM   [facility] pharmacy phone numbers are listed on [email or web address]

## 2020-10-23 NOTE — Progress Notes (Signed)
Progress Note  Patient Name: Ian [name] Date of Encounter: 10/23/2020  [facility] Cardiologist: Sherren Mocha, MD   Patient Profile     64 y.o. male with a history of CAD s/p CABG,  metastatic colon cancer with pulmonary and hepatic mets admitted 12/1 with chest pain and shortness of breath.  last heart cath 01/04/20 in the setting of non-STEMI>> multivessel disease treated medically.  11/21 Echo with EF 40-45%, repeat 12/4>> 30-35%Despite recurrent persistent chest pain dating back to 9/21 Hs Tn persistently negative.  Chest x-ray however showed interval pneumonia  12/2 hsTn  50>>5900, Tx with IV heparin 12/3 atrial fib -RVR complicated by hypotension on amiodarone holding sinus respiratory failure and>>> intubation    Ongoing pressors    Subjective   Denies chest pain    Inpatient Medications    Scheduled Meds: . aspirin  81 mg Per Tube Daily  . chlorhexidine gluconate (MEDLINE KIT)  15 mL Mouth Rinse BID  . Chlorhexidine Gluconate Cloth  6 each Topical Daily  . clopidogrel  75 mg Per Tube Daily  . docusate  100 mg Per Tube BID  . feeding supplement (PROSource TF)  90 mL Per Tube TID  . guaiFENesin  20 mL Per Tube Q8H  . insulin aspart  0-20 Units Subcutaneous Q4H  . insulin aspart  7 Units Subcutaneous Q4H  . insulin detemir  30 Units Subcutaneous BID  . levalbuterol  0.63 mg Nebulization Q6H  . mouth rinse  15 mL Mouth Rinse 10 times per day  . multivitamin with minerals  1 tablet Per Tube Daily  . pantoprazole sodium  40 mg Per Tube Daily  . polyethylene glycol  17 g Per Tube Daily  . ranolazine  500 mg Oral BID  . rosuvastatin  40 mg Per Tube QHS   Continuous Infusions: . sodium chloride Stopped (10/19/20 2328)  . sodium chloride Stopped (10/21/20 1418)  . amiodarone 30 mg/hr (10/23/20 0700)  . azithromycin Stopped (10/22/20 2226)  . cefTRIAXone (ROCEPHIN)  IV Stopped (10/22/20 2340)  . dexmedetomidine (PRECEDEX) IV infusion 0.6 mcg/kg/hr  (10/23/20 0700)  . dextrose 5% lactated ringers Stopped (10/21/20 0655)  . feeding supplement (VITAL 1.5 CAL) 1,000 mL (10/22/20 2001)  . fentaNYL infusion INTRAVENOUS 50 mcg/hr (10/23/20 0700)  . heparin 1,350 Units/hr (10/23/20 0700)  . nitroGLYCERIN Stopped (10/21/20 0839)  . norepinephrine (LEVOPHED) Adult infusion 8 mcg/min (10/23/20 0700)  . phenylephrine (NEO-SYNEPHRINE) Adult infusion Stopped (10/21/20 1626)  . potassium chloride 100 mL/hr at 10/23/20 0700   PRN Meds: acetaminophen (TYLENOL) oral liquid 160 mg/5 mL, dextrose, fentaNYL, levalbuterol, ondansetron (ZOFRAN) IV, sodium chloride flush   Vital Signs    Vitals:   10/23/20 0630 10/23/20 0645 10/23/20 0700 10/23/20 0758  BP:      Pulse: (!) 103 (!) 101 (!) 102   Resp: (!) 26 (!) 26 (!) 26   Temp:    98.4 F (36.9 C)  TempSrc:    Oral  SpO2: 100% 100% 100%   Weight:      Height:        Intake/Output Summary (Last 24 hours) at 10/23/2020 0813 Last data filed at 10/23/2020 0700 Gross per 24 hour  Intake 3801.93 ml  Output 722 ml  Net 3079.93 ml   Last 3 Weights 10/23/2020 10/19/2020 10/19/2020  Weight (lbs) 195 lb 5.2 oz 177 lb 181 lb 9.6 oz  Weight (kg) 88.6 kg 80.287 kg 82.373 kg      Telemetry    sinus -  Personally Reviewed  ECG       Physical Exam  Well developed and nourished intubated no acute distress HENT normal Neck supple   Good air movement  Regular but rapid rate and rhythm, no murmurs or gallops Abd-soft with active BS No Clubbing cyanosis tr edema Skin-warm and dry A & Oriented  Grossly normal sensory and motor function     Labs    High Sensitivity Troponin:   Recent Labs  Lab 10/18/20 0438 10/19/20 2146 10/20/20 0121 10/20/20 0324 10/20/20 0608  TROPONINIHS 44* 51* 1,386* 4,619* 5,929*      Chemistry Recent Labs  Lab 10/18/20 0214 10/19/20 0940 10/19/20 2146 10/20/20 0324 10/21/20 0252 10/21/20 0252 10/21/20 1609 10/22/20 0410 10/23/20 0507  NA   < > 135 133*    < > 134*   < > 134* 133* 134*  K   < > 4.4 4.3   < > 3.5   < > 4.9 3.8 3.3*  CL   < > 98 97*   < > 99  --   --  101 102  CO2   < > 24 19*   < > 22  --   --  19* 18*  GLUCOSE   < > 425* 431*   < > 160*  --   --  352* 336*  BUN   < > 25 24*   < > 31*  --   --  52* 76*  CREATININE   < > 0.88 1.30*   < > 1.31*  --   --  2.48* 3.34*  CALCIUM   < > 9.4 9.2   < > 8.3*  --   --  7.8* 7.5*  PROT  --   --  6.8  --   --   --   --  5.3*  --   ALBUMIN  --   --  3.3*  --   --   --   --  2.0*  --   AST  --   --  24  --   --   --   --  259*  --   ALT  --   --  25  --   --   --   --  170*  --   ALKPHOS  --   --  118  --   --   --   --  174*  --   BILITOT  --   --  1.2  --   --   --   --  0.4  --   GFRNONAA   < > 91 >60   < > >60  --   --  28* 20*  GFRAA  --  106  --   --   --   --   --   --   --   ANIONGAP   < >  --  17*   < > 13  --   --  13 14   < > = values in this interval not displayed.     Hematology Recent Labs  Lab 10/21/20 0252 10/21/20 0252 10/21/20 1609 10/22/20 0410 10/23/20 0507  WBC 16.1*  --   --  15.4* 13.5*  RBC 3.42*  --   --  3.77* 3.39*  HGB 8.8*   < > 9.9* 9.5* 8.7*  HCT 28.1*   < > 29.0* 30.0* 27.2*  MCV 82.2  --   --  79.6* 80.2  MCH 25.7*  --   --  25.2* 25.7*  MCHC 31.3  --   --  31.7 32.0  RDW 15.8*  --   --  16.0* 16.2*  PLT 258  --   --  312 284   < > = values in this interval not displayed.    BNP Recent Labs  Lab 10/19/20 2146 10/22/20 0410  BNP 688.7* 1,460.1*     DDimer No results for input(s): DDIMER in the last 168 hours.   Radiology    DG Chest 1 View  Result Date: 10/21/2020 CLINICAL DATA:  64 year old male with central line placement. EXAM: ABDOMEN - 1 VIEW; CHEST  1 VIEW COMPARISON:  Earlier chest radiograph dated 10/21/2020. FINDINGS: Endotracheal tube with poorly visualized tip but appears above the carina. Enteric tube extends below the diaphragm with tip likely in the distal stomach. Right IJ central venous line with tip close to the  cavoatrial junction. Left subclavian central venous catheter with tip over central SVC. Bilateral confluent pulmonary opacities as seen previously consistent with multifocal pneumonia. There are bilateral pleural effusions, right greater than left. No pneumothorax. Stable cardiomegaly. Atherosclerotic calcification of the aorta. Median sternotomy wires and CABG vascular clips. No acute osseous pathology. Coronary vascular calcification. IMPRESSION: 1. Interval placement of right IJ central venous line with tip over central SVC. No pneumothorax. 2. Endotracheal tube above the carina and enteric tube in the distal stomach. 3. Bilateral confluent pulmonary opacities as seen previously consistent with multifocal pneumonia. Bilateral pleural effusions, right greater than left. Electronically Signed   By: Anner Crete M.D.   On: 10/21/2020 16:39   DG Abd 1 View  Result Date: 10/21/2020 CLINICAL DATA:  64 year old male with central line placement. EXAM: ABDOMEN - 1 VIEW; CHEST  1 VIEW COMPARISON:  Earlier chest radiograph dated 10/21/2020. FINDINGS: Endotracheal tube with poorly visualized tip but appears above the carina. Enteric tube extends below the diaphragm with tip likely in the distal stomach. Right IJ central venous line with tip close to the cavoatrial junction. Left subclavian central venous catheter with tip over central SVC. Bilateral confluent pulmonary opacities as seen previously consistent with multifocal pneumonia. There are bilateral pleural effusions, right greater than left. No pneumothorax. Stable cardiomegaly. Atherosclerotic calcification of the aorta. Median sternotomy wires and CABG vascular clips. No acute osseous pathology. Coronary vascular calcification. IMPRESSION: 1. Interval placement of right IJ central venous line with tip over central SVC. No pneumothorax. 2. Endotracheal tube above the carina and enteric tube in the distal stomach. 3. Bilateral confluent pulmonary opacities as  seen previously consistent with multifocal pneumonia. Bilateral pleural effusions, right greater than left. Electronically Signed   By: Anner Crete M.D.   On: 10/21/2020 16:39   DG CHEST PORT 1 VIEW  Result Date: 10/22/2020 CLINICAL DATA:  Follow-up pneumonia EXAM: PORTABLE CHEST 1 VIEW COMPARISON:  10/21/2020 FINDINGS: Cardiac shadow is stable. Aortic calcifications are again seen. Left chest wall port, endotracheal tube and gastric catheter are noted and stable. Right jugular catheter is again noted and stable as well. Patchy bilateral infiltrates are again seen and stable. Some slight increase of central vascular congestion is noted. IMPRESSION: Increased vascular congestion superimposed over bilateral airspace opacities. Electronically Signed   By: Inez Catalina M.D.   On: 10/22/2020 09:17   DG CHEST PORT 1 VIEW  Result Date: 10/21/2020 CLINICAL DATA:  Hypoxia.  Respiratory distress. EXAM: PORTABLE CHEST 1 VIEW COMPARISON:  One-view chest x-ray 10/20/2020 FINDINGS: The heart is enlarged. Atherosclerotic calcifications are again noted at the aortic arch. Left  subclavian Port-A-Cath is stable. Right lower lobe and upper lobe airspace opacities are present. Increasing interstitial and airspace opacities are present on the left. Progressive right greater than left pleural effusions are present. IMPRESSION: 1. Progressive bilateral interstitial and airspace disease, right greater than left. This is concerning for pneumonia. 2. Progressive bilateral pleural effusions, right greater than left. 3. Stable cardiomegaly. Electronically Signed   By: San Morelle M.D.   On: 10/21/2020 09:25   ECHOCARDIOGRAM LIMITED  Result Date: 10/22/2020    ECHOCARDIOGRAM LIMITED REPORT   Patient Name:   Ian [name] Date of Exam: 10/22/2020 Medical Rec #:  [id number]        Height:       65.0 in Accession #:    [id number]       Weight:       177.0 lb Date of Birth:  [birth year]        BSA:          1.878 m Patient  Age:    48 years         BP:           97/65 mmHg Patient Gender: M                HR:           104 bpm. Exam Location:  Inpatient Procedure: 2D Echo and Intracardiac Opacification Agent Indications:    acute coronary syndrome  History:        Patient has prior history of Echocardiogram examinations, most                 recent 09/30/2020. Signs/Symptoms:Chest Pain; Risk                 Factors:Diabetes, Sleep Apnea and Dyslipidemia.  Sonographer:    Johny Chess Referring Phys: [id number] Candee Furbish  Sonographer Comments: Echo performed with patient supine and on artificial respirator. IMPRESSIONS  1. Left ventricular ejection fraction, by estimation, is 30 to 35%. The left ventricle has moderately decreased function. The left ventricle demonstrates regional wall motion abnormalities (see scoring diagram/findings for description). There is mild left ventricular hypertrophy. Left ventricular diastolic parameters are consistent with Grade III diastolic dysfunction (restrictive). Elevated left atrial pressure.  2. Right ventricular systolic function is normal. The right ventricular size is normal.  3. The mitral valve is normal in structure. Trivial mitral valve regurgitation.  4. The aortic valve is calcified. There is moderate calcification of the aortic valve. Aortic valve regurgitation is not visualized. Mild aortic valve stenosis. FINDINGS  Left Ventricle: Left ventricular ejection fraction, by estimation, is 30 to 35%. The left ventricle has moderately decreased function. The left ventricle demonstrates regional wall motion abnormalities. Definity contrast agent was given IV to delineate the left ventricular endocardial borders. The left ventricular internal cavity size was normal in size. There is mild left ventricular hypertrophy. Left ventricular diastolic parameters are consistent with Grade III diastolic dysfunction (restrictive). Elevated left atrial pressure.  LV Wall Scoring: The entire apex is  akinetic. The mid anteroseptal segment, mid anterolateral segment, and mid anterior segment are hypokinetic. The inferior wall, posterior wall, basal anteroseptal segment, basal anterolateral segment, mid inferoseptal segment, basal anterior segment, and basal inferoseptal segment are normal. Right Ventricle: The right ventricular size is normal. Right vetricular wall thickness was not well visualized. Right ventricular systolic function is normal. The tricuspid regurgitant velocity is 2.55 m/s, and with an assumed right atrial pressure of 8 mmHg, the estimated right  ventricular systolic pressure is 02.5 mmHg. Left Atrium: Left atrial size was not well visualized. Right Atrium: Right atrial size was not well visualized. Pericardium: There is no evidence of pericardial effusion. Mitral Valve: The mitral valve is normal in structure. Trivial mitral valve regurgitation. Tricuspid Valve: The tricuspid valve is normal in structure. Tricuspid valve regurgitation is trivial. Aortic Valve: The aortic valve is calcified. There is moderate calcification of the aortic valve. Aortic valve regurgitation is not visualized. Mild aortic stenosis is present. Aorta: The aortic root and ascending aorta are structurally normal, with no evidence of dilitation. IAS/Shunts: The interatrial septum was not well visualized. LEFT VENTRICLE PLAX 2D LVIDd:         4.80 cm Diastology LVIDs:         3.80 cm LV e' medial:    4.46 cm/s LV PW:         1.10 cm LV E/e' medial:  29.8 LV IVS:        0.90 cm LV e' lateral:   4.79 cm/s                        LV E/e' lateral: 27.8  IVC IVC diam: 2.40 cm LEFT ATRIUM         Index LA diam:    4.00 cm 2.13 cm/m  AORTIC VALVE LVOT Vmax:   99.30 cm/s LVOT Vmean:  60.400 cm/s LVOT VTI:    0.127 m  AORTA Ao Asc diam: 3.40 cm MITRAL VALVE                TRICUSPID VALVE MV Area (PHT): 6.12 cm     TR Peak grad:   26.0 mmHg MV Decel Time: 124 msec     TR Vmax:        255.00 cm/s MV E velocity: 133.00 cm/s MV A  velocity: 62.40 cm/s   SHUNTS MV E/A ratio:  2.13         Systemic VTI: 0.13 m Oswaldo Milian MD Electronically signed by Oswaldo Milian MD Signature Date/Time: 10/22/2020/3:52:39 PM    Final     Cardiac Studies   Heart cath 01/04/20: 1.  Severe multivessel coronary artery disease with chronic total occlusion of the LAD and RCA, continued patency of the left mainstem and left circumflex stents with focal mild to moderate in-stent restenosis at the transition of the left main into the circumflex. 2.  Status post aortocoronary bypass surgery with continued patency of the LIMA to LAD graft and severe aorto ostial stenosis of the radial graft to PDA treated successfully with PCI using a 3.0 x 30 mm resolute Onyx DES with intravascular ultrasound guidance 3.  Nonobstructive ostial left circumflex restenosis as demonstrated by intravascular ultrasound with a minimal lumen area of 5.96 mm  Recommendations: Long-term dual antiplatelet therapy with aspirin and Effient, continued aggressive medical therapy, 2D echocardiogram for assessment of LV systolic function.   Echo 09/30/20: 1. Left ventricular ejection fraction, by estimation, is 40 to 45%. The  left ventricle has mildly decreased function. The left ventricle  demonstrates global hypokinesis. Left ventricular diastolic parameters are  consistent with Grade II diastolic  dysfunction (pseudonormalization).  2. Right ventricular systolic function is normal. The right ventricular  size is normal. There is normal pulmonary artery systolic pressure. The  estimated right ventricular systolic pressure is 85.2 mmHg.  3. Left atrial size was moderately dilated.  4. The mitral valve is normal in structure. Mild mitral valve  regurgitation.  No evidence of mitral stenosis.  5. The aortic valve is tricuspid. Aortic valve regurgitation is not  visualized. Mild aortic valve stenosis. Aortic valve mean gradient  measures 11.0 mmHg.  6. The  inferior vena cava is dilated in size with >50% respiratory  variability, suggesting right atrial pressure of 8 mmHg.  7. Technically difficult study with very poor images even with Definity.  For more accurate EF determination, would consider cardiac MR.     Assessment & Plan    Pneumonia respiratory failure  Atrial fibrillation-RVR complicated by hypotension  Coronary artery disease with prior CABG severe residual native/graft disease  NSTEMI  Congestive heart failure-chronic/acute-systolic/diastolic  Renal insufficiency acute/chronic creatinine 3.4<<2.5   Metastatic colon cancer with pulmonary hepatic metastases  Palliative care consult ongoing considering code status    Intubated and stable   Holding sinus on amio  Worsening renal function   Continue heparin for Afib and NSTEMI ? demand  Will stop plavix and use ASA alone with anticoagulation started for afib--worry in this cancer patient about bleeding risks    For questions or updates, please contact [facility] Please consult [email or web address] for contact info under        Signed, Virl Axe, MD  10/23/2020, 8:13 AM

## 2020-10-24 ENCOUNTER — Inpatient Hospital Stay (HOSPITAL_COMMUNITY): Payer: Federal, State, Local not specified - PPO

## 2020-10-24 DIAGNOSIS — I4891 Unspecified atrial fibrillation: Secondary | ICD-10-CM | POA: Diagnosis not present

## 2020-10-24 DIAGNOSIS — R778 Other specified abnormalities of plasma proteins: Secondary | ICD-10-CM

## 2020-10-24 DIAGNOSIS — J9600 Acute respiratory failure, unspecified whether with hypoxia or hypercapnia: Secondary | ICD-10-CM

## 2020-10-24 DIAGNOSIS — I214 Non-ST elevation (NSTEMI) myocardial infarction: Secondary | ICD-10-CM | POA: Diagnosis not present

## 2020-10-24 DIAGNOSIS — Z66 Do not resuscitate: Secondary | ICD-10-CM

## 2020-10-24 DIAGNOSIS — I249 Acute ischemic heart disease, unspecified: Secondary | ICD-10-CM | POA: Diagnosis not present

## 2020-10-24 DIAGNOSIS — J189 Pneumonia, unspecified organism: Secondary | ICD-10-CM | POA: Diagnosis not present

## 2020-10-24 DIAGNOSIS — J9601 Acute respiratory failure with hypoxia: Secondary | ICD-10-CM

## 2020-10-24 DIAGNOSIS — I509 Heart failure, unspecified: Secondary | ICD-10-CM | POA: Diagnosis not present

## 2020-10-24 LAB — CBC
HCT: 24.2 % — ABNORMAL LOW (ref 39.0–52.0)
HCT: 25.8 % — ABNORMAL LOW (ref 39.0–52.0)
Hemoglobin: 8 g/dL — ABNORMAL LOW (ref 13.0–17.0)
Hemoglobin: 8.1 g/dL — ABNORMAL LOW (ref 13.0–17.0)
MCH: 25.3 pg — ABNORMAL LOW (ref 26.0–34.0)
MCH: 26.1 pg (ref 26.0–34.0)
MCHC: 31.4 g/dL (ref 30.0–36.0)
MCHC: 33.1 g/dL (ref 30.0–36.0)
MCV: 78.8 fL — ABNORMAL LOW (ref 80.0–100.0)
MCV: 80.6 fL (ref 80.0–100.0)
Platelets: 272 10*3/uL (ref 150–400)
Platelets: 301 10*3/uL (ref 150–400)
RBC: 3.07 MIL/uL — ABNORMAL LOW (ref 4.22–5.81)
RBC: 3.2 MIL/uL — ABNORMAL LOW (ref 4.22–5.81)
RDW: 16.9 % — ABNORMAL HIGH (ref 11.5–15.5)
RDW: 17 % — ABNORMAL HIGH (ref 11.5–15.5)
WBC: 11.6 10*3/uL — ABNORMAL HIGH (ref 4.0–10.5)
WBC: 12.2 10*3/uL — ABNORMAL HIGH (ref 4.0–10.5)
nRBC: 0 % (ref 0.0–0.2)
nRBC: 0 % (ref 0.0–0.2)

## 2020-10-24 LAB — GLUCOSE, CAPILLARY
Glucose-Capillary: 172 mg/dL — ABNORMAL HIGH (ref 70–99)
Glucose-Capillary: 180 mg/dL — ABNORMAL HIGH (ref 70–99)
Glucose-Capillary: 183 mg/dL — ABNORMAL HIGH (ref 70–99)
Glucose-Capillary: 184 mg/dL — ABNORMAL HIGH (ref 70–99)
Glucose-Capillary: 187 mg/dL — ABNORMAL HIGH (ref 70–99)
Glucose-Capillary: 214 mg/dL — ABNORMAL HIGH (ref 70–99)
Glucose-Capillary: 225 mg/dL — ABNORMAL HIGH (ref 70–99)
Glucose-Capillary: 231 mg/dL — ABNORMAL HIGH (ref 70–99)
Glucose-Capillary: 551 mg/dL (ref 70–99)

## 2020-10-24 LAB — RENAL FUNCTION PANEL
Albumin: 2.4 g/dL — ABNORMAL LOW (ref 3.5–5.0)
Anion gap: 15 (ref 5–15)
BUN: 87 mg/dL — ABNORMAL HIGH (ref 8–23)
CO2: 16 mmol/L — ABNORMAL LOW (ref 22–32)
Calcium: 7.4 mg/dL — ABNORMAL LOW (ref 8.9–10.3)
Chloride: 103 mmol/L (ref 98–111)
Creatinine, Ser: 4.14 mg/dL — ABNORMAL HIGH (ref 0.61–1.24)
GFR, Estimated: 15 mL/min — ABNORMAL LOW (ref >60)
Glucose, Bld: 258 mg/dL — ABNORMAL HIGH (ref 70–99)
Phosphorus: 4.9 mg/dL — ABNORMAL HIGH (ref 2.5–4.6)
Potassium: 3.8 mmol/L (ref 3.5–5.1)
Sodium: 134 mmol/L — ABNORMAL LOW (ref 135–145)

## 2020-10-24 LAB — BASIC METABOLIC PANEL
Anion gap: 15 (ref 5–15)
BUN: 91 mg/dL — ABNORMAL HIGH (ref 8–23)
CO2: 16 mmol/L — ABNORMAL LOW (ref 22–32)
Calcium: 7.7 mg/dL — ABNORMAL LOW (ref 8.9–10.3)
Chloride: 103 mmol/L (ref 98–111)
Creatinine, Ser: 4.37 mg/dL — ABNORMAL HIGH (ref 0.61–1.24)
GFR, Estimated: 14 mL/min — ABNORMAL LOW (ref >60)
Glucose, Bld: 200 mg/dL — ABNORMAL HIGH (ref 70–99)
Potassium: 3.6 mmol/L (ref 3.5–5.1)
Sodium: 134 mmol/L — ABNORMAL LOW (ref 135–145)

## 2020-10-24 LAB — APTT: aPTT: 60 seconds — ABNORMAL HIGH (ref 24–36)

## 2020-10-24 LAB — HEPARIN LEVEL (UNFRACTIONATED): Heparin Unfractionated: 0.27 IU/mL — ABNORMAL LOW (ref 0.30–0.70)

## 2020-10-24 LAB — TROPONIN I (HIGH SENSITIVITY)
Troponin I (High Sensitivity): 3736 ng/L (ref <18)
Troponin I (High Sensitivity): 4207 ng/L (ref <18)

## 2020-10-24 MED ORDER — SODIUM CHLORIDE 0.9 % FOR CRRT: INTRAVENOUS_CENTRAL | Status: DC | PRN

## 2020-10-24 MED ORDER — PRISMASOL BGK 4/2.5 32-4-2.5 MEQ/L REPLACEMENT SOLN: Status: DC

## 2020-10-24 MED ORDER — INSULIN ASPART 100 UNIT/ML ~~LOC~~ SOLN
2.0000 [IU] | SUBCUTANEOUS | Status: DC
  Administered 2020-10-24 – 2020-10-25 (×3): 6 [IU] via SUBCUTANEOUS

## 2020-10-24 MED ORDER — HEPARIN (PORCINE) 25000 UT/250ML-% IV SOLN
1500.0000 [IU]/h | INTRAVENOUS | Status: DC
  Administered 2020-10-24: 1450 [IU]/h via INTRAVENOUS
  Administered 2020-10-25: 1500 [IU]/h via INTRAVENOUS
  Administered 2020-10-25: 07:00:00 1450 [IU]/h via INTRAVENOUS
  Administered 2020-10-26 – 2020-10-27 (×2): 1500 [IU]/h via INTRAVENOUS
  Administered 2020-10-28 – 2020-10-29 (×3): 1600 [IU]/h via INTRAVENOUS
  Administered 2020-10-30: 05:00:00 1500 [IU]/h via INTRAVENOUS
  Filled 2020-10-24 (×9): qty 250

## 2020-10-24 MED ORDER — HEPARIN SODIUM (PORCINE) 1000 UNIT/ML DIALYSIS
1000.0000 [IU] | INTRAMUSCULAR | Status: DC | PRN
  Administered 2020-10-30: 07:00:00 1000 [IU] via INTRAVENOUS_CENTRAL
  Filled 2020-10-24 (×2): qty 6
  Filled 2020-10-24: qty 4
  Filled 2020-10-24: qty 5

## 2020-10-24 MED ORDER — INSULIN DETEMIR 100 UNIT/ML ~~LOC~~ SOLN
22.0000 [IU] | Freq: Two times a day (BID) | SUBCUTANEOUS | Status: DC
  Administered 2020-10-24: 22 [IU] via SUBCUTANEOUS
  Filled 2020-10-24 (×2): qty 0.22

## 2020-10-24 MED ORDER — NOREPINEPHRINE 16 MG/250ML-% IV SOLN
0.0000 ug/min | INTRAVENOUS | Status: DC
  Administered 2020-10-25: 15 ug/min via INTRAVENOUS
  Administered 2020-10-26: 7 ug/min via INTRAVENOUS
  Filled 2020-10-24 (×3): qty 250

## 2020-10-24 MED ORDER — INSULIN DETEMIR 100 UNIT/ML ~~LOC~~ SOLN
22.0000 [IU] | Freq: Two times a day (BID) | SUBCUTANEOUS | Status: DC
  Administered 2020-10-24 – 2020-10-25 (×2): 22 [IU] via SUBCUTANEOUS
  Filled 2020-10-24 (×3): qty 0.22

## 2020-10-24 MED ORDER — LEVALBUTEROL HCL 0.63 MG/3ML IN NEBU
0.6300 mg | INHALATION_SOLUTION | Freq: Four times a day (QID) | RESPIRATORY_TRACT | Status: DC
  Administered 2020-10-24 – 2020-10-28 (×17): 0.63 mg via RESPIRATORY_TRACT
  Filled 2020-10-24 (×17): qty 3

## 2020-10-24 MED ORDER — DEXTROSE 10 % IV SOLN: INTRAVENOUS | Status: DC | PRN

## 2020-10-24 MED ORDER — INSULIN ASPART 100 UNIT/ML ~~LOC~~ SOLN
7.0000 [IU] | SUBCUTANEOUS | Status: DC
  Administered 2020-10-24: 7 [IU] via SUBCUTANEOUS

## 2020-10-24 MED ORDER — INSULIN ASPART 100 UNIT/ML ~~LOC~~ SOLN
2.0000 [IU] | SUBCUTANEOUS | Status: DC
  Administered 2020-10-24: 4 [IU] via SUBCUTANEOUS

## 2020-10-24 MED ORDER — LEVALBUTEROL HCL 0.63 MG/3ML IN NEBU
0.6300 mg | INHALATION_SOLUTION | Freq: Four times a day (QID) | RESPIRATORY_TRACT | Status: DC
  Filled 2020-10-24 (×2): qty 3

## 2020-10-24 MED ORDER — DEXMEDETOMIDINE HCL IN NACL 400 MCG/100ML IV SOLN
0.4000 ug/kg/h | INTRAVENOUS | Status: AC
  Administered 2020-10-24: 0.8 ug/kg/h via INTRAVENOUS
  Administered 2020-10-25: 0.5 ug/kg/h via INTRAVENOUS
  Administered 2020-10-25 – 2020-10-26 (×2): 0.4 ug/kg/h via INTRAVENOUS
  Administered 2020-10-26: 0.5 ug/kg/h via INTRAVENOUS
  Administered 2020-10-26 – 2020-10-27 (×2): 0.4 ug/kg/h via INTRAVENOUS
  Filled 2020-10-24 (×8): qty 100

## 2020-10-24 MED ORDER — INSULIN ASPART 100 UNIT/ML ~~LOC~~ SOLN
7.0000 [IU] | SUBCUTANEOUS | Status: DC
  Administered 2020-10-24 – 2020-10-26 (×14): 7 [IU] via SUBCUTANEOUS

## 2020-10-24 MED ORDER — PRISMASOL BGK 4/2.5 32-4-2.5 MEQ/L EC SOLN: Status: DC

## 2020-10-24 NOTE — Progress Notes (Signed)
Daily Progress Note   Patient Name: Ian [name]       Date: 10/24/2020 DOB: 04/04/1956  Age: 64 y.o. MRN#: [id number] Attending Physician: Candee Furbish, MD Primary Care Physician: Sharilyn Sites, MD Admit Date: 10/19/2020  Reason for Consultation/Follow-up: To discuss complex medical decision making related to patient's goals of care  Subjective: Met patient and son Ian [name]) at bedside.  Then Ian [name] and I went to a conference room and talked.  Ian [name] and his family live with Ian [name] and Ian [name] right now as they are building a house.  The family is exceptionally close. Ian [name] is the middle of three sons.    He understands the critical nature of his father's condition and asks very pertinent questions about his father - is he suffering?   Ian [name] explains his father was experiencing severe chest pain and pain from colon cancer at home.  On one hand he doesn't want him to suffer thru a prolonged hospitalization only to go home and suffer thru dying with colon cancer.   On the other hand he feels his father is still fighting to go home for Christmas and he wants to fight with him.  We added Ian [name] on by telephone.  We talked about if things go well and he is able to come off the vent and stabilize  - going home with Hospice ([facility]).  Discussed the in home services they offer.  Discussed code status.  Ian [name] and Ian [name] were both opposed to resuscitation "he has been thru so much".  We returned to the room and talked with Ian [name].  Asked Ian [name] "if your heart were to stop and God called you home, would you want [place] to aggressively resuscitate you "?  Ian [name] clearly shook his head no.  Ian [name] and Ian [name] supported his decision.  We made a plan to meet again tomorrow at 10 AM with both Ian [name] and Ian [name]  present at bedside for a palliative discussion with Ian [name] about hospice services in the home.  Assessment: Fragile 64 year old gentleman who has just started CRRT for metabolic acidosis.  Currently intubated due to multifocal pneumonia and acute on chronic heart failure.  The hope is that he will be able to come off of the vent as CRRT cleans the acid from his blood.  He will hopefully stabilize and be  able to go home with hospice to spend his last days to weeks.   Patient Profile/HPI: Ian [name] is a 64 y.o. male with multiple medical problems including CAD status post CABG ischemic cardiomyopathy with EF of 40 to 45%, PAD status post right femoropopliteal bypass, paroxysmal AF, and stage IV colorectal cancer metastatic to liver (initially diagnosed (08/2018) status post neoadjuvant FOLFIRI chemotherapy and partial colectomy.  Patient was lost to oncology follow-up. Patient was hospitalized 09/30/2020-10/01/2020 with unstable angina. CT of abdomen and pelvis (11/10) and CT of the chest (11/18) revealed multiple new liver and pulmonary metastases.  Patient was again seen by outpatient medical oncology and plan was to resume FOLFIRI and bevacizumab.  Patient is now readmitted 10/20/2020 with unstable angina and pneumonia.  He developed hypoxic respiratory failure requiring intubation.  Palliative care is consulted to address goals.    Length of Stay: 5   Vital Signs: BP 115/64   Pulse 92   Temp 97.6 F (36.4 C) (Axillary)   Resp (!) 26   Ht '5\' 5"'  (1.651 m)   Wt 91 kg   SpO2 100%   BMI 33.38 kg/m  SpO2: SpO2: 100 % O2 Device: O2 Device: Ventilator O2 Flow Rate: O2 Flow Rate (L/min): 8 L/min       Palliative Assessment/Data: 10%     Palliative Care Plan    Recommendations/Plan:  CODE STATUS changed to DNR.  Patient's goal is to be at home for 1 more Christmas!  Wife and son want him home for Christmas but do not want him to suffer any longer.  Continue with current  care  PMT will follow up at 10 AM on 12/7 to discuss hospice services with patient and family.  Code Status:  DNR  Prognosis:  Difficult to estimate at this point, will be able to better predict prognosis if he is able to come off CRRT and ventilation.  If he is successful he will hopefully have weeks at home.  Discharge Planning:  To Be Determined.  Care plan was discussed with wife and son.  Patient changed his CODE STATUS to DNR  Thank you for allowing the Palliative Medicine Team to assist in the care of this patient.  Total time spent: 60 minutes     Greater than 50%  of this time was spent counseling and coordinating care related to the above assessment and plan.  Ian Jenny, PA-C Palliative Medicine  Please contact Palliative MedicineTeam phone at [phone] for questions and concerns between 7 am - 7 pm.   Please see AMION for individual provider pager numbers.

## 2020-10-24 NOTE — Progress Notes (Signed)
[facility] for Heparin Indication: chest pain/ACS  Allergies  Allergen Reactions  . Anectine [Succinylcholine] Other (See Comments)    Atypical pseudocholinesterase deficiency  . Nsaids Other (See Comments)    Contraindicated (can only have asa 81 mg)    Patient Measurements: Height: 5\' 5"  (165.1 cm) Weight: 91 kg (200 lb 9.9 oz) IBW/kg (Calculated) : 61.5 Heparin Dosing Weight: 75.9 kg  Vital Signs: Temp: 99.1 F (37.3 C) (12/06 0754) Temp Source: Oral (12/06 0300) BP: 133/74 (12/06 1200) Pulse Rate: 127 (12/06 1200)  Labs: Recent Labs    10/22/20 0410 10/22/20 0410 10/23/20 0507 10/23/20 0507 10/23/20 2345 10/24/20 0210  HGB 9.5*   < > 8.7*   < > 8.1* 8.0*  HCT 30.0*   < > 27.2*  --  25.8* 24.2*  PLT 312   < > 284  --  301 272  APTT 91*  --  78*  --   --  60*  HEPARINUNFRC 0.91*  --  0.48  --   --  0.27*  CREATININE 2.48*  --  3.34*  --   --  4.37*  TROPONINIHS  --   --   --   --  4,207* 3,736*   < > = values in this interval not displayed.    Estimated Creatinine Clearance: 17.9 mL/min (A) (by C-G formula based on SCr of 4.37 mg/dL (H)).  Assessment: 64 y.o. male admitted with chest pain and history of atrial fibrillation on Xarelto prior to admission. Xarelto is on hold and patient transitioned to IV Heparin. Currently following aPTT values due to effect of recent Xarelto on heparin levels.   APTT is 60, HL 0.27 - both now below goal on IV Heparin at 1350 units/hr.  CBC is low but stable at Hgb 8, plt wnl.   Heparin off briefly for HD line placement. Heparin level and aptt appear correlated so will stop checking aptt.   Goal of Therapy:  Heparin level 0.3-0.7 units/mL aPTT 66-102 seconds Monitor platelets by anticoagulation protocol: Yes   Plan:  Will restart heparin at 1450 units/hr @1300  Daily heparin level, CBC  Erin Hearing PharmD., BCPS Clinical Pharmacist 10/24/2020 12:17 PM  [facility] pharmacy phone numbers are  listed on [email or web address]

## 2020-10-24 NOTE — Progress Notes (Signed)
NAME:  Ian [name], MRN:  [id number], DOB:  [birth year], LOS: 5 ADMISSION DATE:  10/19/2020, CONSULTATION DATE:  10/21/2020 REFERRING MD: Beau Fanny , CHIEF COMPLAINT:  Airway and vent management   Brief History   64 year old male with history of NSTEMI 09/30/2020, treated with conservative management with increasing nitrates and adding Ranexa.  11/10 he was diagnosed with multiple new hepatic and pulmonary metastatic areas with mild anterior bladder wall thickening.  He was referred to oncology with plan to start chemo next week.  He had an ED visit 11/30, and returned 10/19/2020 due to chest pain is occurring multiple times per day, at rest and with exertion. He was unable to perform any task without angina. He was admitted by the cardiology service. CTA was done which was + for extensive multifocal pulmonary infiltrate and dense consolidation of the right middle lobe most in keeping with acute infection. Small associated right parapneumonic effusion.He was place on BiPAP 10/21/2020 for desats on HFNC.  PCCM have been consulted for high risk for  intubation and vent management.    History of present illness   Ian [name] is a 64 year old male with hx of CAD s/p CABG 2008, STEMI 08/2019 s/p DES x2 to L radial-PDA with DES x 2 to protected LM into LCx, repeat STEMI 12/2019 s/p DES to L radial-PDA, DM, GERD, HTN, HLD, ICM, PAD s/p R fempop BPG 02/2020, PAF (noted after FPBPG), and colon CA with recent new  dx of new hepatic and pulmonary metastasis (prior mets to liver s/p partial colectomy andchemotherapy).  Admitted for NSTEMI 09/30/2020, treated with conservative management with increasing nitrates and adding Ranexa.  He had an ED visit 11/30, and returned 10/19/2020 due to chest pain is occurring multiple times per day, at rest and with exertion. He was unable to perform any task without angina. He was admitted by the cardiology service. CTA was done which was + for extensive multifocal pulmonary  infiltrate and dense consolidation of the right middle lobe most in keeping with acute infection. Small associated right parapneumonic effusion. He was place on BiPAP 10/21/2020 for desats on HFNC.  PCCM have been consulted for high risk for  intubation and vent management.   Past Medical History    Past Medical History:  Diagnosis Date  . 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 Colon cancer ([place])   . Complication of anesthesia    atypical pseudo-cholinesterase deficiency  . Coronary artery disease 01/10/2013   s/p CABG 2008 // s/p STEMI in 08/2019>>DES x 2 to L radial-PDA; staged PCI: DES x 2 to protected LM into LCx // s/p inf-lat STEMI 12/2019 >> DES to L Radial-PDA  . Diabetes mellitus   . Family Hx of adverse reaction to anesthesia    sister also has atypical pseudo-cholinesterase deficiency  . Gastroesophageal reflux disease   . History of heart bypass surgery   . HLD (hyperlipidemia)   . Hypertension   . Ischemic cardiomyopathy    Echo 01/04/2020: EF 45-50, mild LVH, Gr 2 DD, normal RVSF, mild to mod LAE, mild MR, mild AS (mean gradient 6 mmHg)  . Mild aortic stenosis   . Mild mitral regurgitation   . Myocardial infarction ([place])   . PAD (peripheral artery disease) ([facility])    s/p R fem-pop bypass 02/2020 (Dr. Donnetta Hutching)  . Paroxysmal atrial fibrillation ([facility])    CHADS-VASc=3 // noted post op after fem pop bypass in 02/2020; due to high risk, Apixaban started  Significant Hospital Events   10/19/2020 Admission Recent admission 09/30/2020-10/01/2020 ED Visit 10/18/2020 12/6 still in shock, still volume overloaded.  Renal function and acid-base worse.  Nephrology consulted.  Starting CRRT.  Consults:  10/21/2020 PCCM  Procedures:  10/21/2020 Intubation  Significant Diagnostic Tests:  CTA  10/20/2020 Interval development of extensive multifocal pulmonary infiltrate and dense consolidation of the right middle lobe most in keeping with acute infection. Small associated right  parapneumonic effusion. No central obstructing lesion. Multiple bilateral pulmonary nodules are identified, but partially obscured, in keeping with pulmonary metastatic disease. Mild left ventricular dilation. No pulmonary embolism. Aortic Atherosclerosis (ICD10-I70.0).  Heart cath 01/04/20: 1. Severe multivessel coronary artery disease with chronic total occlusion of the LAD and RCA, continued patency of the left mainstem and left circumflex stents with focal mild to moderate in-stent restenosis at the transition of the left main into the circumflex. 2. Status post aortocoronary bypass surgery with continued patency of the LIMA to LAD graft and severe aorto ostial stenosis of the radial graft to PDA treated successfully with PCI using a 3.0 x 30 mm resolute Onyx DES with intravascular ultrasound guidance 3. Nonobstructive ostial left circumflex restenosis as demonstrated by intravascular ultrasound with a minimal lumen area of 5.96 mm  Recommendations: Long-term dual antiplatelet therapy with aspirin and Effient, continued aggressive medical therapy, 2D echocardiogram for assessment of LV systolic function.   Echo 09/30/20: 1. Left ventricular ejection fraction, by estimation, is 40 to 45%. The  left ventricle has mildly decreased function. The left ventricle  demonstrates global hypokinesis. Left ventricular diastolic parameters are  consistent with Grade II diastolic  dysfunction (pseudonormalization).  2. Right ventricular systolic function is normal. The right ventricular  size is normal. There is normal pulmonary artery systolic pressure. The  estimated right ventricular systolic pressure is 74.2 mmHg.  3. Left atrial size was moderately dilated.  4. The mitral valve is normal in structure. Mild mitral valve  regurgitation. No evidence of mitral stenosis.  5. The aortic valve is tricuspid. Aortic valve regurgitation is not  visualized. Mild aortic valve stenosis.  Aortic valve mean gradient  measures 11.0 mmHg.  6. The inferior vena cava is dilated in size with >50% respiratory  variability, suggesting right atrial pressure of 8 mmHg.  7. Technically difficult study with very poor images even with Definity.  For more accurate EF determination, would consider cardiac MR.   Micro Data:  10/20/2020 Blood Cx: pending 10/19/2020 Covid 19 Negative 10/19/2020 Influenza A&B Negative  Antimicrobials:  Azithromycin 10/21/2020>> Rocephin 10/21/2020>>  Interim history/subjective:  Cool, clammy, reports chest tightness still.  Remains in atrial fibrillation with RVR Objective   Blood pressure 91/63, pulse (Abnormal) 125, temperature 99.1 F (37.3 C), resp. rate (Abnormal) 30, height _0  (1.651 m), weight 91 kg, SpO2 95 %. CVP:  [8 mmHg-18 mmHg] 14 mmHg  Vent Mode: PRVC FiO2 (%):  [40 %-60 %] 60 % Set Rate:  [26 bmp] 26 bmp Vt Set:  [600 mL] 600 mL PEEP:  [5 cmH20] 5 cmH20 Plateau Pressure:  [18 cmH20-26 cmH20] 22 cmH20   Intake/Output Summary (Last 24 hours) at 10/24/2020 0846 Last data filed at 10/24/2020 0700 Gross per 24 hour  Intake 3654.95 ml  Output 1188 ml  Net 2466.95 ml   Filed Weights   10/19/20 2130 10/23/20 0145 10/24/20 0500  Weight: 80.3 kg 88.6 kg 91 kg    Examination: General: 64 year old white male currently on Precedex and fentanyl infusion remains in shock, remains cool clammy slightly  diaphoretic  HEENT: Normocephalic atraumatic orally intubated no JVD right IJ triple-lumen catheter in place  Pulmonary: Equal bilateral breath sounds.  Decreased bases mild accessory use prior to PEEP titration  Cardiac: Irregular irregular with atrial fibrillation and rapid ventricular response on telemetry extremities cool, sluggish capillary refill, pulses are palpable dependent edema  Abdomen soft nontender, liquid stools  GU Foley catheter in place minimal urine output  Neuro awake opens eyes follows commands generalized weakness no focal  deficits appreciated    Resolved Hospital Problem list     Assessment & Plan:  Acute Respiratory Failure in setting of extensive multifocal pulmonary infiltrates and dense consolidation of the right middle lobe working diagnosis pneumonia (NOS), superimposed on pulmonary metastasis and complicated by volume overload  -question aspiration, versus CAP versus postobstructive process.  BAL unrevealing.  On CAP coverage. -Portable chest x-ray personally reviewed endotracheal tube in satisfactory position.  Dense right lower lobe consolidation, multiple nodular opacities throughout, bilateral hazy airspace disease. -Increased PEEP to 8 due to work of breathing and ongoing oxygen requirements; I think he will need volume removal via CRRT in order to successfully liberate him from the vent Plan Continue full ventilator support VAP bundle PAD protocol RASS goal -2 Volume removal via CRRT Day #5 of 5 azithromycin, day #5 of 7 ceftriaxone A.m. chest x-ray  Non-ST elevation MI in context of known CAD s/p CABG with severe disease in 3/4 grafts; complicated by Acute on Chronic systolic HF, and cardiogenic shock- Serial troponins trending down Plan Aiming for euvolemia Continue telemetry monitoring Titrate norepinephrine for mean arterial pressure goal greater than 65 Try to correct acidosis Holding beta-blockade  Atrial Fibrillation with RVR on amiodarone Cardiology following -Remains tachycardic Plan Continue amiodarone Continue aspirin with IV heparin Continue telemetry monitoring Continue digoxin If remains tachycardic in spite of correcting acid-base would consider cardioversion in the next 24 hours  Cardiorenal +/- ATN; with nonanion gap/borderline anion gap metabolic acidosis -BUN and creatinine continuing to climb,Acid-base worse -UOP:  Plan May need CRRT to get off ventilator Continue serial chemistries Strict intake output Renal dose medications  Anemia w/out evidence of  bleeding Plan Trend CBC Transfuse for hgb < 7  Hyperglycemia Plan Continue Endo tool with insulin drip; transition Goal glucose 140-180  Metastatic Colon Cancer with new involvement Lung and Liver Seen by oncology, plan for chemo Plan We will follow-up with oncology at discharge with plan for possible palliative chemo if/when he recovers He has been seen by palliative care, family discussing goals of care currently  Best practice (evaluated daily)   Diet: TF Pain/Anxiety/Delirium protocol (if indicated): Precedex and Fentanyl VAP protocol (if indicated): initiated DVT prophylaxis: Heparin gtt GI prophylaxis: Protonix Glucose control: CBG/ SSI Mobility: BR last date of multidisciplinary goals of care discussion: Completed once again on 12/7; NP, MD, patient and his son Code Status: Full Disposition: ICU  My cct 34 min   Erick Colace ACNP-BC [place] Pager # [phone] OR # [phone] if no answer

## 2020-10-24 NOTE — Procedures (Signed)
Central Venous Catheter Insertion Procedure Note  Ian [name]  [id number]  [birth year]  Date:10/24/20  Time:10:52 AM   Provider Performing:Pete E Kary Kos   Procedure: Insertion of Non-tunneled Central Venous Catheter(36556)with US guidance (05697)    Indication(s) Hemodialysis  Consent Risks of the procedure as well as the alternatives and risks of each were explained to the patient and/or caregiver.  Consent for the procedure was obtained and is signed in the bedside chart  Anesthesia Topical only with 1% lidocaine   Timeout Verified patient identification, verified procedure, site/side was marked, verified correct patient position, special equipment/implants available, medications/allergies/relevant history reviewed, required imaging and test results available.  Sterile Technique Maximal sterile technique including full sterile barrier drape, hand hygiene, sterile gown, sterile gloves, mask, hair covering, sterile ultrasound probe cover (if used).  Procedure Description Area of catheter insertion was cleaned with chlorhexidine and draped in sterile fashion.   With real-time ultrasound guidance a HD catheter was placed into the left internal jugular vein.  Nonpulsatile blood flow and easy flushing noted in all ports.  The catheter was sutured in place and sterile dressing applied.  Complications/Tolerance None; patient tolerated the procedure well. Chest X-ray is ordered to verify placement for internal jugular or subclavian cannulation.  Chest x-ray is not ordered for femoral cannulation.  EBL Minimal  Specimen(s) None  Erick Colace ACNP-BC [place] Pager # [phone] OR # [phone] if no answer

## 2020-10-24 NOTE — Consult Note (Addendum)
Reason for Consult: CRRT Referring Physician: Dr. Illene Bolus is an 63 y.o. male.  HPI: This is a 64 year old male with a history of CAD s/p CABG 2008, STEMI 10/20 s/p DES x2 to L radial PDA with DES x2 to protected LM into LCx, STEMi 2/21 s/p DES to L radial PDA, DM, GERD, HTN, HLD, ICM, PAD s/p R fem-pop BPG 4/21, PAF, and metastatic colon cancer with hepatic and pulmonary metastasis with prior mets to liver s/p partial colectomy and chemotherapy who initially presented with chest pain and admitted to cardiology service, CTA showed extensive multifocal pulmonary infiltrates and dense consolidation of right middle lobe and small associated right parapneumonic effusion. Placed on BiPAP and PCCM was consulted for high risk of intubation and vent management. Patient was failing BiPAP and he was intubated. Started on azithromycin and rocephin for pneumonia, unclear if CAP vs aspiration vs postobstructive process. Patient has been requiring higher amounts of PEEP and oxygen requirements, there was concern for volume overload worsening his respiratory status. Cr and BUN continue to rise, BL cr around 1.0, up to 4.37 today. He has had low amounts of urinary output, has been 11L net positive since admission. Nephrology consulted for CRRT.   Patient currently intubated, able to answer yes or no questions. He denies any pain right now, denies nausea, vomiting, fevers, chills, headache, or discomfort at this time.   Trend in Creatinine: Creatinine, Ser  Date/Time Value Ref Range Status  10/24/2020 02:10 AM 4.37 (H) 0.61 - 1.24 mg/dL Final  10/23/2020 05:07 AM 3.34 (H) 0.61 - 1.24 mg/dL Final  10/22/2020 04:10 AM 2.48 (H) 0.61 - 1.24 mg/dL Final    Comment:    DELTA CHECK NOTED  10/21/2020 02:52 AM 1.31 (H) 0.61 - 1.24 mg/dL Final  10/20/2020 10:20 PM 1.23 0.61 - 1.24 mg/dL Final  10/20/2020 02:51 PM 1.42 (H) 0.61 - 1.24 mg/dL Final  10/20/2020 10:40 AM 1.70 (H) 0.61 - 1.24 mg/dL Final   10/20/2020 03:24 AM 1.62 (H) 0.61 - 1.24 mg/dL Final  10/19/2020 09:46 PM 1.30 (H) 0.61 - 1.24 mg/dL Final  10/19/2020 09:40 AM 0.88 0.76 - 1.27 mg/dL Final  10/18/2020 02:14 AM 1.34 (H) 0.61 - 1.24 mg/dL Final  10/01/2020 04:42 AM 0.96 0.61 - 1.24 mg/dL Final  09/30/2020 10:07 AM 1.17 0.61 - 1.24 mg/dL Final  09/28/2020 01:00 PM 1.00 0.61 - 1.24 mg/dL Final  05/13/2020 08:40 AM 1.15 0.76 - 1.27 mg/dL Final  02/25/2020 10:37 PM 1.58 (H) 0.61 - 1.24 mg/dL Final  02/25/2020 02:29 PM 1.33 (H) 0.61 - 1.24 mg/dL Final  02/23/2020 03:20 AM 0.94 0.61 - 1.24 mg/dL Final  02/22/2020 03:11 PM 1.14 0.61 - 1.24 mg/dL Final  02/22/2020 06:35 AM 1.34 (H) 0.61 - 1.24 mg/dL Final  02/18/2020 11:32 AM 1.10 0.61 - 1.24 mg/dL Final  01/06/2020 04:13 AM 1.12 0.61 - 1.24 mg/dL Final  01/05/2020 03:47 AM 0.84 0.61 - 1.24 mg/dL Final  01/04/2020 01:20 AM 1.24 0.61 - 1.24 mg/dL Final  09/16/2019 07:00 AM 1.02 0.61 - 1.24 mg/dL Final  09/15/2019 08:44 AM 1.00 0.61 - 1.24 mg/dL Final  09/14/2019 02:43 AM 1.27 (H) 0.61 - 1.24 mg/dL Final  09/14/2019 02:43 AM 1.25 (H) 0.61 - 1.24 mg/dL Final  09/13/2019 11:18 PM 1.34 (H) 0.61 - 1.24 mg/dL Final  03/10/2019 07:56 AM 1.04 0.61 - 1.24 mg/dL Final  02/24/2019 08:05 AM 1.07 0.61 - 1.24 mg/dL Final  02/10/2019 09:09 AM 1.03 0.61 - 1.24 mg/dL Final  01/23/2019 11:16 AM 0.85 0.61 - 1.24 mg/dL Final  01/13/2019 09:03 AM 1.26 (H) 0.61 - 1.24 mg/dL Final  12/30/2018 09:06 AM 1.06 0.61 - 1.24 mg/dL Final  12/15/2018 08:21 AM 0.94 0.61 - 1.24 mg/dL Final  12/01/2018 08:53 AM 1.02 0.61 - 1.24 mg/dL Final  11/17/2018 11:07 AM 1.02 0.61 - 1.24 mg/dL Final  11/03/2018 08:32 AM 1.37 (H) 0.61 - 1.24 mg/dL Final  09/19/2018 04:18 AM 0.93 0.61 - 1.24 mg/dL Final  09/18/2018 05:08 AM 1.07 0.61 - 1.24 mg/dL Final  09/12/2018 02:19 PM 1.08 0.61 - 1.24 mg/dL Final  02/02/2015 09:30 AM 0.98 0.50 - 1.35 mg/dL Final  01/10/2013 12:45 AM 0.95 0.50 - 1.35 mg/dL Final    PMH:   Past  Medical History:  Diagnosis Date  . 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 Colon cancer ([place])   . Complication of anesthesia    atypical pseudo-cholinesterase deficiency  . Coronary artery disease 01/10/2013   s/p CABG 2008 // s/p STEMI in 08/2019>>DES x 2 to L radial-PDA; staged PCI: DES x 2 to protected LM into LCx // s/p inf-lat STEMI 12/2019 >> DES to L Radial-PDA  . Diabetes mellitus   . Family Hx of adverse reaction to anesthesia    sister also has atypical pseudo-cholinesterase deficiency  . Gastroesophageal reflux disease   . History of heart bypass surgery   . HLD (hyperlipidemia)   . Hypertension   . Ischemic cardiomyopathy    Echo 01/04/2020: EF 45-50, mild LVH, Gr 2 DD, normal RVSF, mild to mod LAE, mild MR, mild AS (mean gradient 6 mmHg)  . Mild aortic stenosis   . Mild mitral regurgitation   . Myocardial infarction ([place])   . PAD (peripheral artery disease) ([facility])    s/p R fem-pop bypass 02/2020 (Dr. Donnetta Hutching)  . Paroxysmal atrial fibrillation ([facility])    CHADS-VASc=3 // noted post op after fem pop bypass in 02/2020; due to high risk, Apixaban started    PSH:   Past Surgical History:  Procedure Laterality Date  . ABDOMINAL AORTOGRAM W/LOWER EXTREMITY Bilateral 02/18/2020   Procedure: ABDOMINAL AORTOGRAM W/LOWER EXTREMITY;  Surgeon: Marty Heck, MD;  Location: [facility];  Service: Cardiovascular;  Laterality: Bilateral;  . COLONOSCOPY N/A 08/29/2018   Procedure: COLONOSCOPY;  Surgeon: Daneil Dolin, MD;  [facility];  Service: Endoscopy;  Laterality: N/A;  9:30  . CORONARY ARTERY BYPASS GRAFT     4 vessels  . CORONARY ATHERECTOMY N/A 09/15/2019   Procedure: CORONARY ATHERECTOMY;  Surgeon: Sherren Mocha, MD;  Location: [facility] [facility];  Service: Cardiovascular;  LM-LCx Atherectomy - DES PCI (*Protected LM)   . CORONARY STENT INTERVENTION N/A 09/15/2019   Procedure: CORONARY STENT INTERVENTION;  Surgeon: Sherren Mocha, MD;  Location: [facility];; Successful orbital atherectomy, PTCA, and overlapping DES treatment of severe calcific stenosis in the left main and left circumflex using a 2.5x34 mm Resolute Onyx DES (left circumflex) and 3.0x30 mm Resolute Onyx DES (overlapped proximally in the LCx extending into left main)  . CORONARY STENT INTERVENTION N/A 01/04/2020   Procedure: CORONARY STENT INTERVENTION;  Surgeon: Sherren Mocha, MD;  Location: [facility];  Service: Cardiovascular;  Laterality: N/A;  . CORONARY/GRAFT ACUTE MI REVASCULARIZATION N/A 09/14/2019   Procedure: Coronary/Graft Acute MI Revascularization;  Surgeon: Sherren Mocha, MD;  Location: [place] [facility];  Service: Cardiovascular;  Laterality: N/A;  . FEMORAL-POPLITEAL BYPASS GRAFT Right 02/25/2020   Procedure: FEMORAL-ABOVE KNEE POPLITEAL ARTERY BYPASS  GRAFT with Saphenous vein;  Surgeon: Rosetta Posner, MD;  Location: [facility];  Service: Vascular;  Laterality: Right;  . heart bypass    . INSERTION OF MESH  02/07/2015   Procedure: INSERTION OF MESH;  Surgeon: Aviva Signs Md, MD;  Location: [facility];  Service: General;;  . INTRAVASCULAR ULTRASOUND/IVUS N/A 01/04/2020   Procedure: Intravascular Ultrasound/IVUS;  Surgeon: Sherren Mocha, MD;  Location: [place] [facility];  Service: Cardiovascular;  Laterality: N/A;  . LEFT HEART CATH AND CORONARY ANGIOGRAPHY N/A 09/14/2019   Procedure: LEFT HEART CATH AND CORONARY ANGIOGRAPHY;  Surgeon: Sherren Mocha, MD;  Location: [place] [facility];  Service: Cardiovascular;  Laterality: N/A;  . LEFT HEART CATH AND CORONARY ANGIOGRAPHY N/A 01/04/2020   Procedure: LEFT HEART CATH AND CORONARY ANGIOGRAPHY;  Surgeon: Sherren Mocha, MD;  Location: [place] [facility];  Service: Cardiovascular;  Laterality: N/A;  . PARTIAL COLECTOMY N/A 09/17/2018   Procedure: PARTIAL COLECTOMY WITH PARTIAL WEDGE RESECTION LIVER METASTASIS;  Surgeon: Aviva Signs, MD;  Location: [facility];  Service: General;  Laterality: N/A;  .  POLYPECTOMY  08/29/2018   Procedure: POLYPECTOMY;  Surgeon: Daneil Dolin, MD;  [facility];  Service: Endoscopy;;  . PORTACATH PLACEMENT Left 10/10/2018   Procedure: INSERTION PORT-A-CATH (catheter in left subclavian);  Surgeon: Aviva Signs, MD;  Location: [facility];  Service: General;  Laterality: Left;  . UMBILICAL HERNIA REPAIR N/A 02/07/2015   Procedure: UMBILICAL HERNIORRHAPHY WITH MESH;  Surgeon: Aviva Signs Md, MD;  Location: [facility];  Service: General;  Laterality: N/A;    Allergies:  Allergies  Allergen Reactions  . Anectine [Succinylcholine] Other (See Comments)    Atypical pseudocholinesterase deficiency  . Nsaids Other (See Comments)    Contraindicated (can only have asa 81 mg)    Medications:   Prior to Admission medications   Medication Sig Start Date End Date Taking? Authorizing Provider  apixaban (ELIQUIS) 5 MG TABS tablet Take 1 tablet (5 mg total) by mouth 2 (two) times daily. 05/31/20  Yes Sherren Mocha, MD  aspirin EC 81 MG tablet Take 81 mg by mouth in the morning. Swallow whole.   Yes [provider]  clopidogrel (PLAVIX) 75 MG tablet Take 75 mg by mouth daily. 10/06/20  Yes [provider]  Coenzyme Q10 (COQ10) 100 MG CAPS Take 100 mg by mouth daily.   Yes [provider]  diltiazem (CARDIZEM CD) 360 MG 24 hr capsule Take 1 capsule (360 mg total) by mouth daily. 04/01/20 03/27/21 Yes Sherren Mocha, MD  diphenhydramine-acetaminophen (TYLENOL PM) 25-500 MG TABS tablet Take 2 tablets by mouth at bedtime.   Yes [provider]  Evolocumab (REPATHA SURECLICK) 409 MG/ML SOAJ Inject 140 mg into the skin every 14 (fourteen) days. 08/02/20  Yes Sherren Mocha, MD  ezetimibe-simvastatin (VYTORIN) 10-80 MG tablet Take 1 tablet by mouth daily.  09/03/20  Yes [provider]  FARXIGA 10 MG TABS tablet Take 10 mg by mouth daily.  12/19/18  Yes [provider]  hydrochlorothiazide (HYDRODIURIL) 25 MG tablet Take 1  tablet (25 mg total) by mouth daily. 09/24/19  Yes Bhagat, Bhavinkumar, PA  icosapent Ethyl (VASCEPA) 1 g capsule Take 2 capsules (2 g total) by mouth 2 (two) times daily. 09/27/20  Yes Weaver, Scott T, PA-C  insulin aspart protamine - aspart (NOVOLOG MIX 70/30 FLEXPEN) (70-30) 100 UNIT/ML FlexPen Inject 20 Units into the skin in the morning, at noon, and at bedtime.    Yes [provider]  Insulin Glargine (BASAGLAR KWIKPEN) 100 UNIT/ML SOPN Inject 40 Units into the skin at bedtime.  12/10/18  Yes [provider]  isosorbide mononitrate (IMDUR) 30 MG 24 hr tablet Take 1 tablet (30 mg total) by mouth every evening. Patient taking differently: Take 30 mg by mouth at bedtime.  10/01/20 12/30/20 Yes [place] Eric J, DO  isosorbide mononitrate (IMDUR) 60 MG 24 hr tablet Take 1 tablet (60 mg total) by mouth daily. Patient taking differently: Take 60 mg by mouth in the morning.  02/09/20  Yes Weaver, Scott T, PA-C  JANUVIA 100 MG tablet Take 100 mg by mouth daily.  12/19/18  Yes [provider]  metFORMIN (GLUCOPHAGE) 500 MG tablet Take 500 mg by mouth 2 (two) times daily. 08/27/19  Yes [provider]  metoprolol succinate (TOPROL-XL) 100 MG 24 hr tablet Take 1 tablet (100 mg total) by mouth 2 (two) times daily. Take with or immediately following a meal. 09/16/19  Yes Reino Bellis B, NP  nitroGLYCERIN (NITROSTAT) 0.4 MG SL tablet Place 1 tablet (0.4 mg total) under the tongue every 5 (five) minutes as needed for chest pain. 10/18/20  Yes Larene Pickett, PA-C  pantoprazole (PROTONIX) 40 MG tablet Take 40 mg by mouth daily.   Yes [provider]  ramipril (ALTACE) 10 MG capsule Take 10 mg by mouth daily.   Yes [provider]  ranolazine (RANEXA) 500 MG 12 hr tablet Take 1 tablet (500 mg total) by mouth 2 (two) times daily. 10/01/20 12/30/20 Yes [place] Eric J, DO  rosuvastatin (CRESTOR) 40 MG tablet Take 1 tablet (40 mg total) by mouth at bedtime. 01/06/20   Yes Reino Bellis B, NP  clopidogrel (PLAVIX) 300 MG TABS tablet Take 1 tablet (300 mg total) by mouth as directed. TAKE THIS PM 10/19/20   Kathyrn Drown D, NP    Inpatient medications: . aspirin  81 mg Per Tube Daily  . chlorhexidine gluconate (MEDLINE KIT)  15 mL Mouth Rinse BID  . Chlorhexidine Gluconate Cloth  6 each Topical Daily  . clopidogrel  75 mg Per Tube Daily  . docusate  100 mg Per Tube BID  . feeding supplement (PROSource TF)  90 mL Per Tube TID  . guaiFENesin  20 mL Per Tube Q8H  . insulin aspart  2-6 Units Subcutaneous Q4H  . insulin aspart  7 Units Subcutaneous Q4H  . insulin detemir  22 Units Subcutaneous Q12H  . [START ON 10/25/2020] levalbuterol  0.63 mg Nebulization Q6H  . mouth rinse  15 mL Mouth Rinse 10 times per day  . multivitamin with minerals  1 tablet Per Tube Daily  . pantoprazole sodium  40 mg Per Tube Daily  . polyethylene glycol  17 g Per Tube Daily  . rosuvastatin  40 mg Per Tube QHS    Discontinued Meds:   Medications Discontinued During This Encounter  Medication Reason  . nitroGLYCERIN (NITROSTAT) SL tablet 0.4 mg   . albuterol (PROVENTIL) (2.5 MG/3ML) 0.083% nebulizer solution 2.5 mg   . insulin aspart (novoLOG) injection 15 Units Discontinued by provider  . dapagliflozin propanediol (FARXIGA) tablet 10 mg   . insulin glargine (LANTUS) injection 30 Units   . insulin aspart (novoLOG) injection 0-15 Units   . insulin aspart (novoLOG) injection 20 Units   . vancomycin (VANCOREADY) IVPB 750 mg/150 mL   . piperacillin-tazobactam (ZOSYN) IVPB 3.375 g   . insulin isophane & regular human (NOVOLIN 70/30 FLEXPEN) (70-30) 100 UNIT/ML KwikPen Entry Error  . insulin  aspart protamine- aspart (NOVOLOG MIX 70/30) (70-30) 100 UNIT/ML injection   . heparin ADULT infusion 100 units/mL (25000 units/265m sodium chloride 0.45%)   . Ampicillin-Sulbactam (UNASYN) 3 g in sodium chloride 0.9 % 100 mL IVPB   . albuterol (PROVENTIL) (2.5 MG/3ML) 0.083% nebulizer  solution 2.5 mg   . insulin regular, human (MYXREDLIN) 100 units/ 100 mL infusion   . metoprolol tartrate (LOPRESSOR) tablet 12.5 mg   . amiodarone (NEXTERONE PREMIX) 360-4.14 MG/200ML-% (1.8 mg/mL) IV infusion   . amiodarone (NEXTERONE PREMIX) 360-4.14 MG/200ML-% (1.8 mg/mL) IV infusion   . potassium chloride SA (KLOR-CON) CR tablet 40 mEq   . amiodarone (NEXTERONE) IV bolus only 150 mg/100 mL Change in therapy  . albuterol (PROVENTIL) (2.5 MG/3ML) 0.083% nebulizer solution 2.5 mg   . feeding supplement (ENSURE ENLIVE / ENSURE PLUS) liquid 237 mL   . guaiFENesin (MUCINEX) 12 hr tablet 600 mg Inpatient Standard  . metoprolol tartrate (LOPRESSOR) tablet 12.5 mg Inpatient Standard  . ramipril (ALTACE) capsule 10 mg   . rosuvastatin (CRESTOR) tablet 40 mg   . aspirin EC tablet 81 mg Inpatient Standard  . pantoprazole (PROTONIX) EC tablet 40 mg Inpatient Standard  . clopidogrel (PLAVIX) tablet 75 mg   . multivitamin with minerals tablet 1 tablet   . acetaminophen (TYLENOL) tablet 650 mg Inpatient Standard  . insulin aspart (novoLOG) injection 0-15 Units   . insulin aspart (novoLOG) injection 0-9 Units   . icosapent Ethyl (VASCEPA) 1 g capsule 2 g   . ramipril (ALTACE) capsule 10 mg   . insulin detemir (LEVEMIR) injection 40 Units   . insulin aspart (novoLOG) injection 4 Units   . insulin detemir (LEVEMIR) injection 30 Units   . metoprolol tartrate (LOPRESSOR) 25 mg/10 mL oral suspension 12.5 mg   . amiodarone (NEXTERONE) IV bolus only 150 mg/100 mL   . insulin aspart (novoLOG) injection 0-20 Units   . insulin aspart (novoLOG) injection 7 Units   . insulin detemir (LEVEMIR) injection 30 Units   . ranolazine (RANEXA) 12 hr tablet 500 mg   . dextrose 5 % in lactated ringers infusion Dose change  . dextrose 50 % solution 0-50 mL Dose change  . lactated ringers infusion Dose change  . levalbuterol (XOPENEX) nebulizer solution 0.63 mg     Social History:  reports that he has never smoked.  He has never used smokeless tobacco. He reports that he does not drink alcohol and does not use drugs.  Family History:   Family History  Problem Relation Age of Onset  . Hypertension Father   . Diabetes Father   . Heart disease Father   . Diabetes Sister   . Hypertension Sister   . Hypertension Brother   . Diabetes Brother   . Diabetes Sister   . Diabetes Brother   . Heart disease Brother   . Lymphoma Brother   . Hypertension Son     Pertinent items are noted in HPI. Weight change: 2.4 kg  Intake/Output Summary (Last 24 hours) at 10/24/2020 1029 Last data filed at 10/24/2020 0700 Gross per 24 hour  Intake 3190.49 ml  Output 1188 ml  Net 2002.49 ml   BP 104/63   Pulse (!) 117   Temp 99.1 F (37.3 C)   Resp (!) 29   Ht _0  (1.651 m)   Wt 91 kg   SpO2 100%   BMI 33.38 kg/m  Vitals:   10/24/20 0900 10/24/20 0915 10/24/20 0930 10/24/20 0945  BP: (!) 85/61  104/63  Pulse: (!) 133   (!) 117  Resp: (!) 29 (!) 30 (!) 30 (!) 29  Temp:      TempSrc:      SpO2: 100%   100%  Weight:      Height:         General appearance: alert, moderate distress, moderately obese and intubated Head: Normocephalic, without obvious abnormality, atraumatic Neck: no adenopathy, no carotid bruit, 10 JVD, supple, symmetrical, trachea midline and Right IJ in place. Left IJ in place.  Cardio: irregularly irregular rhythm GI: soft, non-tender; bowel sounds normal; no masses,  no organomegaly Extremities: Trace-1+ BL LE swelling, sacral edema noted Pulses: 2+ and symmetric Neurologic: Follows commands, intubated.   Labs: Basic Metabolic Panel: Recent Labs  Lab 10/19/20 2146 10/20/20 0324 10/20/20 1040 10/20/20 1040 10/20/20 1451 10/20/20 1451 10/20/20 2220 10/21/20 0252 10/21/20 1024 10/21/20 1609 10/22/20 0410 10/22/20 1955 10/23/20 0507 10/23/20 1027 10/23/20 1828 10/24/20 0210  NA 133*   < > 131*   < > 133*  --  133* 134*  --  134* 133*  --  134*  --   --  134*  K 4.3    < > 3.5   < > 3.3*   < > 3.1* 3.5  --  4.9 3.8  --  3.3* 3.7  --  3.6  CL 97*   < > 95*  --  96*  --  98 99  --   --  101  --  102  --   --  103  CO2 19*   < > 17*  --  22  --  23 22  --   --  19*  --  18*  --   --  16*  GLUCOSE 431*   < > 508*  --  369*  --  221* 160*  --   --  352*  --  336*  --   --  200*  BUN 24*   < > 33*  --  31*  --  30* 31*  --   --  52*  --  76*  --   --  91*  CREATININE 1.30*   < > 1.70*  --  1.42*  --  1.23 1.31*  --   --  2.48*  --  3.34*  --   --  4.37*  ALBUMIN 3.3*  --   --   --   --   --   --   --   --   --  2.0*  --   --   --   --   --   CALCIUM 9.2   < > 8.8*  --  8.5*  --  8.4* 8.3*  --   --  7.8*  --  7.5*  --   --  7.7*  PHOS  --   --   --   --   --   --   --   --  4.6  --  3.2 3.3 4.0  --  4.3  --    < > = values in this interval not displayed.   Liver Function Tests: Recent Labs  Lab 10/19/20 2146 10/22/20 0410  AST 24 259*  ALT 25 170*  ALKPHOS 118 174*  BILITOT 1.2 0.4  PROT 6.8 5.3*  ALBUMIN 3.3* 2.0*   No results for input(s): LIPASE, AMYLASE in the last 168 hours. No results for input(s): AMMONIA in the last 168 hours. CBC: Recent  Labs  Lab 10/19/20 2146 10/20/20 0324 10/22/20 0410 10/23/20 0507 10/23/20 2345 10/24/20 0210  WBC 18.1*   < > 15.4* 13.5* 12.2* 11.6*  NEUTROABS 15.0*  --   --   --   --   --   HGB 11.2*   < > 9.5* 8.7* 8.1* 8.0*  HCT 36.0*   < > 30.0* 27.2* 25.8* 24.2*  MCV 82.0   < > 79.6* 80.2 80.6 78.8*  PLT 349   < > 312 284 301 272   < > = values in this interval not displayed.   PT/INR: _0 (inr:5) Cardiac Enzymes: )No results for input(s): CKTOTAL, CKMB, CKMBINDEX, TROPONINI in the last 168 hours. CBG: Recent Labs  Lab 10/24/20 0057 10/24/20 0157 10/24/20 0419 10/24/20 0612 10/24/20 0821  GLUCAP 231* 184* 172* 180* 183*    Iron Studies: No results for input(s): IRON, TIBC, TRANSFERRIN, FERRITIN in the last 168 hours.  Xrays/Other Studies: DG Chest 1 View  Result Date:  10/23/2020 CLINICAL DATA:  Follow-up ARDS EXAM: CHEST  1 VIEW COMPARISON:  10/22/2020 FINDINGS: Endotracheal tube and gastric catheter are again seen and stable. Left chest wall port and right jugular central line are again seen and stable. Lungs again demonstrate bilateral airspace opacity primarily in the bases. Small effusions are noted. Vascular congestion is seen but somewhat improved when compared with the prior exam. IMPRESSION: Stable bibasilar opacities. Interval improvement in aeration in the upper lobes. Electronically Signed   By: Inez Catalina M.D.   On: 10/23/2020 10:24   DG Chest Port 1 View  Result Date: 10/23/2020 CLINICAL DATA:  Chest pain EXAM: PORTABLE CHEST 1 VIEW COMPARISON:  October 24, 2019 FINDINGS: The right-sided central venous catheter is well position. The left-sided subclavian Port-A-Cath is stable in positioning. There is no pneumothorax. Bilateral hazy airspace opacities are noted. Multiple pulmonary nodules are again noted. The heart size remains enlarged. There are bilateral pleural effusions with vascular congestion and pulmonary edema. The enteric tube extends below the left hemidiaphragm. The endotracheal tube terminates above the carina. IMPRESSION: 1. Lines and tubes as above. 2. No significant interval change. Electronically Signed   By: Constance Holster M.D.   On: 10/23/2020 23:54   ECHOCARDIOGRAM LIMITED  Result Date: 10/22/2020    ECHOCARDIOGRAM LIMITED REPORT   Patient Name:   PHARELL ROLFSON Date of Exam: 10/22/2020 Medical Rec #:  [id number]        Height:       65.0 in Accession #:    [id number]       Weight:       177.0 lb Date of Birth:  1956-05-17        BSA:          1.878 m Patient Age:    77 years         BP:           97/65 mmHg Patient Gender: M                HR:           104 bpm. Exam Location:  Inpatient Procedure: 2D Echo and Intracardiac Opacification Agent Indications:    acute coronary syndrome  History:        Patient has prior history of  Echocardiogram examinations, most                 recent 09/30/2020. Signs/Symptoms:Chest Pain; Risk  Factors:Diabetes, Sleep Apnea and Dyslipidemia.  Sonographer:    Johny Chess Referring Phys: [id number] Candee Furbish  Sonographer Comments: Echo performed with patient supine and on artificial respirator. IMPRESSIONS  1. Left ventricular ejection fraction, by estimation, is 30 to 35%. The left ventricle has moderately decreased function. The left ventricle demonstrates regional wall motion abnormalities (see scoring diagram/findings for description). There is mild left ventricular hypertrophy. Left ventricular diastolic parameters are consistent with Grade III diastolic dysfunction (restrictive). Elevated left atrial pressure.  2. Right ventricular systolic function is normal. The right ventricular size is normal.  3. The mitral valve is normal in structure. Trivial mitral valve regurgitation.  4. The aortic valve is calcified. There is moderate calcification of the aortic valve. Aortic valve regurgitation is not visualized. Mild aortic valve stenosis. FINDINGS  Left Ventricle: Left ventricular ejection fraction, by estimation, is 30 to 35%. The left ventricle has moderately decreased function. The left ventricle demonstrates regional wall motion abnormalities. Definity contrast agent was given IV to delineate the left ventricular endocardial borders. The left ventricular internal cavity size was normal in size. There is mild left ventricular hypertrophy. Left ventricular diastolic parameters are consistent with Grade III diastolic dysfunction (restrictive). Elevated left atrial pressure.  LV Wall Scoring: The entire apex is akinetic. The mid anteroseptal segment, mid anterolateral segment, and mid anterior segment are hypokinetic. The inferior wall, posterior wall, basal anteroseptal segment, basal anterolateral segment, mid inferoseptal segment, basal anterior segment, and basal inferoseptal  segment are normal. Right Ventricle: The right ventricular size is normal. Right vetricular wall thickness was not well visualized. Right ventricular systolic function is normal. The tricuspid regurgitant velocity is 2.55 m/s, and with an assumed right atrial pressure of 8 mmHg, the estimated right ventricular systolic pressure is 81.1 mmHg. Left Atrium: Left atrial size was not well visualized. Right Atrium: Right atrial size was not well visualized. Pericardium: There is no evidence of pericardial effusion. Mitral Valve: The mitral valve is normal in structure. Trivial mitral valve regurgitation. Tricuspid Valve: The tricuspid valve is normal in structure. Tricuspid valve regurgitation is trivial. Aortic Valve: The aortic valve is calcified. There is moderate calcification of the aortic valve. Aortic valve regurgitation is not visualized. Mild aortic stenosis is present. Aorta: The aortic root and ascending aorta are structurally normal, with no evidence of dilitation. IAS/Shunts: The interatrial septum was not well visualized. LEFT VENTRICLE PLAX 2D LVIDd:         4.80 cm Diastology LVIDs:         3.80 cm LV e' medial:    4.46 cm/s LV PW:         1.10 cm LV E/e' medial:  29.8 LV IVS:        0.90 cm LV e' lateral:   4.79 cm/s                        LV E/e' lateral: 27.8  IVC IVC diam: 2.40 cm LEFT ATRIUM         Index LA diam:    4.00 cm 2.13 cm/m  AORTIC VALVE LVOT Vmax:   99.30 cm/s LVOT Vmean:  60.400 cm/s LVOT VTI:    0.127 m  AORTA Ao Asc diam: 3.40 cm MITRAL VALVE                TRICUSPID VALVE MV Area (PHT): 6.12 cm     TR Peak grad:   26.0 mmHg MV Decel Time: 124 msec  TR Vmax:        255.00 cm/s MV E velocity: 133.00 cm/s MV A velocity: 62.40 cm/s   SHUNTS MV E/A ratio:  2.13         Systemic VTI: 0.13 m Oswaldo Milian MD Electronically signed by Oswaldo Milian MD Signature Date/Time: 10/22/2020/3:52:39 PM    Final      Assessment/Plan: 1.  AKI 2/2 cardiorenal syndrome vs ATN:  1. BL  Cr around 1.0. Patient has had severe ischemic cardiomyopathy and mixed cardiogenic and distributive shock while admitted. Currently on norepinephrine drip for blood pressure. Cr worsening and now up to 4.3 today. Has had a net input of 11.4 L since admission, weight increased from 80 kg to 91 kg. Has been requiring increasing amounts of PEEP and still requiring oxygen.  2. Given patients multiple comorbidities and poor long term prognosis he would not typically be a candidate for long term dialysis however given his respiratory status and goals to attempt to spend his last remaining time with his family it appears reasonable to attempt CRRT for volume removal in an attempt to wean of the ventilator. 3. Left IJ temp placed today. 4. Plan to start CRRT today, 4K bath with net 50-100 ml/hr as tolerated to facilitate extubation 5. Strict I+Os 2. Metabolic acidosis: Starting CRRT.  3. Acute hypoxic respiratory failure multifactorial from pneumonia, pulmonary metastasis and volume overload: 1. Currently intubated. CXR on 12/5 showed right lower lobe consolidation, multiple nodular opacities through out. Currently on azithromycin and ceftriaxone. Pulmonology following and attempting to wean off ventilator.  4. NSTEMI: History of CAD s/p multiple DES, history of acute on chronic systolic heart failure and cardiogenic shock: Continued on heparin and ASA, treating medically for now. Cardiology on board.  5. Afib with RVR: Currently on amiodarone drip, ASA, and digoxin. Remains tachycardic. Considering cardioversion in next 24 hours.  6. Metastatic colon cancer with mets to lung and liver: Per primary, f/u with oncology on discharge for possible palliative chemotherapy if he has improvement 7. Microcytic anemia: Hgb 8.0 today. BL around 10. Likely 2/2 AoCD.  8. Hyperglycemia: Per primary   Asencion Noble 10/24/2020, 10:29 AM   I have seen and examined this patient and agree with the plan of care. Planning  on initiating CRRT for AKI and to help manage fluids; he is not oliguric but UOP inadequate. Hopefully he tolerates UF (starting at 50-100) to facilitate extubation.  Dwana Melena, MD 10/24/2020, 12:03 PM

## 2020-10-24 NOTE — Progress Notes (Signed)
CPT held at this time due to patients heart rate and appearance. Patient did not tolerate 0800 round of CPT well. He became super diaphoretic and HR sustained in Afib with a rate in the 150's after the full 10 minutes.

## 2020-10-24 NOTE — Progress Notes (Signed)
CPT held due to CRRT.

## 2020-10-24 NOTE — Progress Notes (Signed)
Progress Note  Patient Name: Ian [name] Date of Encounter: 10/24/2020  [facility] Cardiologist: Sherren Mocha, MD   Subjective   Intubated, sedated.   Inpatient Medications    Scheduled Meds: . aspirin  81 mg Per Tube Daily  . chlorhexidine gluconate (MEDLINE KIT)  15 mL Mouth Rinse BID  . Chlorhexidine Gluconate Cloth  6 each Topical Daily  . clopidogrel  75 mg Per Tube Daily  . docusate  100 mg Per Tube BID  . feeding supplement (PROSource TF)  90 mL Per Tube TID  . guaiFENesin  20 mL Per Tube Q8H  . insulin aspart  2-6 Units Subcutaneous Q4H  . insulin aspart  7 Units Subcutaneous Q4H  . insulin detemir  22 Units Subcutaneous Q12H  . [START ON 10/25/2020] levalbuterol  0.63 mg Nebulization Q6H  . mouth rinse  15 mL Mouth Rinse 10 times per day  . multivitamin with minerals  1 tablet Per Tube Daily  . pantoprazole sodium  40 mg Per Tube Daily  . polyethylene glycol  17 g Per Tube Daily  . rosuvastatin  40 mg Per Tube QHS   Continuous Infusions: . sodium chloride Stopped (10/19/20 2328)  . sodium chloride Stopped (10/21/20 1418)  . amiodarone 60 mg/hr (10/24/20 0801)  . azithromycin Stopped (10/23/20 2312)  . cefTRIAXone (ROCEPHIN)  IV Stopped (10/23/20 2205)  . dexmedetomidine (PRECEDEX) IV infusion 0.6 mcg/kg/hr (10/24/20 0804)  . dextrose    . dextrose 5% lactated ringers Stopped (10/23/20 1747)  . feeding supplement (VITAL 1.5 CAL) 1,000 mL (10/23/20 1646)  . fentaNYL infusion INTRAVENOUS Stopped (10/23/20 0908)  . heparin 1,350 Units/hr (10/24/20 0700)  . insulin 8 mL/hr at 10/24/20 0700  . nitroGLYCERIN Stopped (10/21/20 0839)  . norepinephrine (LEVOPHED) Adult infusion 3 mcg/min (10/24/20 0700)  . phenylephrine (NEO-SYNEPHRINE) Adult infusion Stopped (10/21/20 1626)   PRN Meds: acetaminophen (TYLENOL) oral liquid 160 mg/5 mL, dextrose, dextrose, fentaNYL, levalbuterol, metoprolol tartrate, ondansetron (ZOFRAN) IV, sodium chloride flush   Vital  Signs    Vitals:   10/24/20 0745 10/24/20 0754 10/24/20 0804 10/24/20 0838  BP:   91/63   Pulse: (!) 125     Resp: (!) 30     Temp:  99.1 F (37.3 C)    TempSrc:      SpO2: 100%  100% 95%  Weight:      Height:        Intake/Output Summary (Last 24 hours) at 10/24/2020 0936 Last data filed at 10/24/2020 0700 Gross per 24 hour  Intake 3277.54 ml  Output 1188 ml  Net 2089.54 ml   Last 3 Weights 10/24/2020 10/23/2020 10/19/2020  Weight (lbs) 200 lb 9.9 oz 195 lb 5.2 oz 177 lb  Weight (kg) 91 kg 88.6 kg 80.287 kg      Telemetry    Atrial fib, rate 120s - Personally Reviewed  ECG    No AM EKG - Personally Reviewed  Physical Exam   GEN: Intubated, sedated.  Neck: No JVD Cardiac: Irreg irreg, tachy. Systolic murmur  Respiratory: rhonci bilaterally GI: Soft MS: No LE edema Neuro:  unable to assess Psych: unable to assess  Labs    High Sensitivity Troponin:   Recent Labs  Lab 10/20/20 0121 10/20/20 0324 10/20/20 0608 10/23/20 2345 10/24/20 0210  TROPONINIHS 1,386* 4,619* 5,929* 4,207* 3,736*      Chemistry Recent Labs  Lab 10/18/20 0214 10/19/20 0940 10/19/20 2146 10/20/20 0324 10/22/20 0410 10/22/20 0410 10/23/20 0507 10/23/20 1027 10/24/20 0210  NA   < > 135 133*   < > 133*  --  134*  --  134*  K   < > 4.4 4.3   < > 3.8   < > 3.3* 3.7 3.6  CL   < > 98 97*   < > 101  --  102  --  103  CO2   < > 24 19*   < > 19*  --  18*  --  16*  GLUCOSE   < > 425* 431*   < > 352*  --  336*  --  200*  BUN   < > 25 24*   < > 52*  --  76*  --  91*  CREATININE   < > 0.88 1.30*   < > 2.48*  --  3.34*  --  4.37*  CALCIUM   < > 9.4 9.2   < > 7.8*  --  7.5*  --  7.7*  PROT  --   --  6.8  --  5.3*  --   --   --   --   ALBUMIN  --   --  3.3*  --  2.0*  --   --   --   --   AST  --   --  24  --  259*  --   --   --   --   ALT  --   --  25  --  170*  --   --   --   --   ALKPHOS  --   --  118  --  174*  --   --   --   --   BILITOT  --   --  1.2  --  0.4  --   --   --   --    GFRNONAA   < > 91 >60   < > 28*  --  20*  --  14*  GFRAA  --  106  --   --   --   --   --   --   --   ANIONGAP   < >  --  17*   < > 13  --  14  --  15   < > = values in this interval not displayed.     Hematology Recent Labs  Lab 10/23/20 0507 10/23/20 2345 10/24/20 0210  WBC 13.5* 12.2* 11.6*  RBC 3.39* 3.20* 3.07*  HGB 8.7* 8.1* 8.0*  HCT 27.2* 25.8* 24.2*  MCV 80.2 80.6 78.8*  MCH 25.7* 25.3* 26.1  MCHC 32.0 31.4 33.1  RDW 16.2* 16.9* 17.0*  PLT 284 301 272    BNP Recent Labs  Lab 10/19/20 2146 10/22/20 0410  BNP 688.7* 1,460.1*     DDimer No results for input(s): DDIMER in the last 168 hours.   Radiology    DG Chest 1 View  Result Date: 10/23/2020 CLINICAL DATA:  Follow-up ARDS EXAM: CHEST  1 VIEW COMPARISON:  10/22/2020 FINDINGS: Endotracheal tube and gastric catheter are again seen and stable. Left chest wall port and right jugular central line are again seen and stable. Lungs again demonstrate bilateral airspace opacity primarily in the bases. Small effusions are noted. Vascular congestion is seen but somewhat improved when compared with the prior exam. IMPRESSION: Stable bibasilar opacities. Interval improvement in aeration in the upper lobes. Electronically Signed   By: Inez Catalina M.D.   On: 10/23/2020 10:24   DG Chest [facility]  1 View  Result Date: 10/23/2020 CLINICAL DATA:  Chest pain EXAM: PORTABLE CHEST 1 VIEW COMPARISON:  October 24, 2019 FINDINGS: The right-sided central venous catheter is well position. The left-sided subclavian Port-A-Cath is stable in positioning. There is no pneumothorax. Bilateral hazy airspace opacities are noted. Multiple pulmonary nodules are again noted. The heart size remains enlarged. There are bilateral pleural effusions with vascular congestion and pulmonary edema. The enteric tube extends below the left hemidiaphragm. The endotracheal tube terminates above the carina. IMPRESSION: 1. Lines and tubes as above. 2. No significant  interval change. Electronically Signed   By: Constance Holster M.D.   On: 10/23/2020 23:54   ECHOCARDIOGRAM LIMITED  Result Date: 10/22/2020    ECHOCARDIOGRAM LIMITED REPORT   Patient Name:   Ian [name] Date of Exam: 10/22/2020 Medical Rec #:  [id number]        Height:       65.0 in Accession #:    [id number]       Weight:       177.0 lb Date of Birth:  [birth year]        BSA:          1.878 m Patient Age:    33 years         BP:           97/65 mmHg Patient Gender: M                HR:           104 bpm. Exam Location:  Inpatient Procedure: 2D Echo and Intracardiac Opacification Agent Indications:    acute coronary syndrome  History:        Patient has prior history of Echocardiogram examinations, most                 recent 09/30/2020. Signs/Symptoms:Chest Pain; Risk                 Factors:Diabetes, Sleep Apnea and Dyslipidemia.  Sonographer:    Johny Chess Referring Phys: [id number] Candee Furbish  Sonographer Comments: Echo performed with patient supine and on artificial respirator. IMPRESSIONS  1. Left ventricular ejection fraction, by estimation, is 30 to 35%. The left ventricle has moderately decreased function. The left ventricle demonstrates regional wall motion abnormalities (see scoring diagram/findings for description). There is mild left ventricular hypertrophy. Left ventricular diastolic parameters are consistent with Grade III diastolic dysfunction (restrictive). Elevated left atrial pressure.  2. Right ventricular systolic function is normal. The right ventricular size is normal.  3. The mitral valve is normal in structure. Trivial mitral valve regurgitation.  4. The aortic valve is calcified. There is moderate calcification of the aortic valve. Aortic valve regurgitation is not visualized. Mild aortic valve stenosis. FINDINGS  Left Ventricle: Left ventricular ejection fraction, by estimation, is 30 to 35%. The left ventricle has moderately decreased function. The left ventricle  demonstrates regional wall motion abnormalities. Definity contrast agent was given IV to delineate the left ventricular endocardial borders. The left ventricular internal cavity size was normal in size. There is mild left ventricular hypertrophy. Left ventricular diastolic parameters are consistent with Grade III diastolic dysfunction (restrictive). Elevated left atrial pressure.  LV Wall Scoring: The entire apex is akinetic. The mid anteroseptal segment, mid anterolateral segment, and mid anterior segment are hypokinetic. The inferior wall, posterior wall, basal anteroseptal segment, basal anterolateral segment, mid inferoseptal segment, basal anterior segment, and basal inferoseptal segment are normal. Right Ventricle: The right ventricular size is  normal. Right vetricular wall thickness was not well visualized. Right ventricular systolic function is normal. The tricuspid regurgitant velocity is 2.55 m/s, and with an assumed right atrial pressure of 8 mmHg, the estimated right ventricular systolic pressure is 26.8 mmHg. Left Atrium: Left atrial size was not well visualized. Right Atrium: Right atrial size was not well visualized. Pericardium: There is no evidence of pericardial effusion. Mitral Valve: The mitral valve is normal in structure. Trivial mitral valve regurgitation. Tricuspid Valve: The tricuspid valve is normal in structure. Tricuspid valve regurgitation is trivial. Aortic Valve: The aortic valve is calcified. There is moderate calcification of the aortic valve. Aortic valve regurgitation is not visualized. Mild aortic stenosis is present. Aorta: The aortic root and ascending aorta are structurally normal, with no evidence of dilitation. IAS/Shunts: The interatrial septum was not well visualized. LEFT VENTRICLE PLAX 2D LVIDd:         4.80 cm Diastology LVIDs:         3.80 cm LV e' medial:    4.46 cm/s LV PW:         1.10 cm LV E/e' medial:  29.8 LV IVS:        0.90 cm LV e' lateral:   4.79 cm/s                         LV E/e' lateral: 27.8  IVC IVC diam: 2.40 cm LEFT ATRIUM         Index LA diam:    4.00 cm 2.13 cm/m  AORTIC VALVE LVOT Vmax:   99.30 cm/s LVOT Vmean:  60.400 cm/s LVOT VTI:    0.127 m  AORTA Ao Asc diam: 3.40 cm MITRAL VALVE                TRICUSPID VALVE MV Area (PHT): 6.12 cm     TR Peak grad:   26.0 mmHg MV Decel Time: 124 msec     TR Vmax:        255.00 cm/s MV E velocity: 133.00 cm/s MV A velocity: 62.40 cm/s   SHUNTS MV E/A ratio:  2.13         Systemic VTI: 0.13 m Oswaldo Milian MD Electronically signed by Oswaldo Milian MD Signature Date/Time: 10/22/2020/3:52:39 PM    Final     Cardiac Studies   Echo 10/22/20:  1. Left ventricular ejection fraction, by estimation, is 30 to 35%. The  left ventricle has moderately decreased function. The left ventricle  demonstrates regional wall motion abnormalities (see scoring  diagram/findings for description). There is mild  left ventricular hypertrophy. Left ventricular diastolic parameters are  consistent with Grade III diastolic dysfunction (restrictive). Elevated  left atrial pressure.  2. Right ventricular systolic function is normal. The right ventricular  size is normal.  3. The mitral valve is normal in structure. Trivial mitral valve  regurgitation.  4. The aortic valve is calcified. There is moderate calcification of the  aortic valve. Aortic valve regurgitation is not visualized. Mild aortic  valve stenosis.   Patient Profile     65 y.o. male with history of severe multi-vessel CAD s/p CABG and multiple prior PCIs/stents, metastatic colon cancer with pulmonary and hepatic mets, ischemic cardiomyopathy admitted with chest pain and dyspnea and found to have pneumonia, elevated troponin, atrial fib with RVR. He has been treated with IV heparin and amiodarone drip. He has required pressor support.   Assessment & Plan    1. Atrial fib with  RVR: Atrial fib this am with HR in the 120s. Continue amiodarone  drip. Continue IV heparin. Continue digoxin. Can consider DCCV this week if unable to control heart rate. He would need a TEE prior to DCCV  2. CAD s/p CABG/Elevated troponin: Not surprising to see a troponin elevation given his presentation with pneumonia and hypotension. LVEF is slightly worse. Currently on ASA and IV heparin, statin. Plavix stopped 10/23/20 by Dr. Caryl Comes.   3. Acute hypoxic respiratory failure with extensive multi-focal pulmonary infiltrates and pneumonia. Intubated and being followed by the PCCM service.   4. Acute on chronic systolic CHF: Volume removal by CRRT today  5. Acute renal failure: Renal function worsening. Possible CRRT today  For questions or updates, please contact [place] Please consult [email or web address] for contact info under        Signed, Lauree Chandler, MD  10/24/2020, 9:36 AM

## 2020-10-25 ENCOUNTER — Inpatient Hospital Stay (HOSPITAL_COMMUNITY): Payer: Federal, State, Local not specified - PPO

## 2020-10-25 DIAGNOSIS — I5021 Acute systolic (congestive) heart failure: Secondary | ICD-10-CM

## 2020-10-25 DIAGNOSIS — I1310 Hypertensive heart and chronic kidney disease without heart failure, with stage 1 through stage 4 chronic kidney disease, or unspecified chronic kidney disease: Secondary | ICD-10-CM

## 2020-10-25 DIAGNOSIS — Z7189 Other specified counseling: Secondary | ICD-10-CM

## 2020-10-25 DIAGNOSIS — I5023 Acute on chronic systolic (congestive) heart failure: Secondary | ICD-10-CM

## 2020-10-25 LAB — RENAL FUNCTION PANEL
Albumin: 2.7 g/dL — ABNORMAL LOW (ref 3.5–5.0)
Albumin: 2.6 g/dL — ABNORMAL LOW (ref 3.5–5.0)
Anion gap: 15 (ref 5–15)
Anion gap: 14 (ref 5–15)
BUN: 61 mg/dL — ABNORMAL HIGH (ref 8–23)
BUN: 44 mg/dL — ABNORMAL HIGH (ref 8–23)
CO2: 20 mmol/L — ABNORMAL LOW (ref 22–32)
CO2: 21 mmol/L — ABNORMAL LOW (ref 22–32)
Calcium: 7.9 mg/dL — ABNORMAL LOW (ref 8.9–10.3)
Calcium: 7.9 mg/dL — ABNORMAL LOW (ref 8.9–10.3)
Chloride: 99 mmol/L (ref 98–111)
Chloride: 100 mmol/L (ref 98–111)
Creatinine, Ser: 3.02 mg/dL — ABNORMAL HIGH (ref 0.61–1.24)
Creatinine, Ser: 2.29 mg/dL — ABNORMAL HIGH (ref 0.61–1.24)
GFR, Estimated: 22 mL/min — ABNORMAL LOW (ref >60)
GFR, Estimated: 31 mL/min — ABNORMAL LOW (ref >60)
Glucose, Bld: 286 mg/dL — ABNORMAL HIGH (ref 70–99)
Glucose, Bld: 191 mg/dL — ABNORMAL HIGH (ref 70–99)
Phosphorus: 3.5 mg/dL (ref 2.5–4.6)
Phosphorus: 3.3 mg/dL (ref 2.5–4.6)
Potassium: 3.8 mmol/L (ref 3.5–5.1)
Potassium: 4.2 mmol/L (ref 3.5–5.1)
Sodium: 134 mmol/L — ABNORMAL LOW (ref 135–145)
Sodium: 135 mmol/L (ref 135–145)

## 2020-10-25 LAB — POCT I-STAT 7, (LYTES, BLD GAS, ICA,H+H)
Acid-base deficit: 9 mmol/L — ABNORMAL HIGH (ref 0.0–2.0)
Bicarbonate: 15.4 mmol/L — ABNORMAL LOW (ref 20.0–28.0)
Calcium, Ion: 0.98 mmol/L — ABNORMAL LOW (ref 1.15–1.40)
HCT: 22 % — ABNORMAL LOW (ref 39.0–52.0)
Hemoglobin: 7.5 g/dL — ABNORMAL LOW (ref 13.0–17.0)
O2 Saturation: 99 %
Patient temperature: 97
Potassium: 3.6 mmol/L (ref 3.5–5.1)
Sodium: 137 mmol/L (ref 135–145)
TCO2: 16 mmol/L — ABNORMAL LOW (ref 22–32)
pCO2 arterial: 24.7 mmHg — ABNORMAL LOW (ref 32.0–48.0)
pH, Arterial: 7.398 (ref 7.350–7.450)
pO2, Arterial: 137 mmHg — ABNORMAL HIGH (ref 83.0–108.0)

## 2020-10-25 LAB — CBC
HCT: 22.5 % — ABNORMAL LOW (ref 39.0–52.0)
Hemoglobin: 7.7 g/dL — ABNORMAL LOW (ref 13.0–17.0)
MCH: 25.8 pg — ABNORMAL LOW (ref 26.0–34.0)
MCHC: 34.2 g/dL (ref 30.0–36.0)
MCV: 75.5 fL — ABNORMAL LOW (ref 80.0–100.0)
Platelets: 311 10*3/uL (ref 150–400)
RBC: 2.98 MIL/uL — ABNORMAL LOW (ref 4.22–5.81)
RDW: 17.2 % — ABNORMAL HIGH (ref 11.5–15.5)
WBC: 16 10*3/uL — ABNORMAL HIGH (ref 4.0–10.5)
nRBC: 0 % (ref 0.0–0.2)

## 2020-10-25 LAB — CULTURE, BLOOD (ROUTINE X 2)
Culture: NO GROWTH
Culture: NO GROWTH
Special Requests: ADEQUATE
Special Requests: ADEQUATE

## 2020-10-25 LAB — GLUCOSE, CAPILLARY
Glucose-Capillary: 161 mg/dL — ABNORMAL HIGH (ref 70–99)
Glucose-Capillary: 167 mg/dL — ABNORMAL HIGH (ref 70–99)
Glucose-Capillary: 199 mg/dL — ABNORMAL HIGH (ref 70–99)
Glucose-Capillary: 210 mg/dL — ABNORMAL HIGH (ref 70–99)
Glucose-Capillary: 236 mg/dL — ABNORMAL HIGH (ref 70–99)
Glucose-Capillary: 273 mg/dL — ABNORMAL HIGH (ref 70–99)

## 2020-10-25 LAB — MAGNESIUM: Magnesium: 2.8 mg/dL — ABNORMAL HIGH (ref 1.7–2.4)

## 2020-10-25 LAB — HEPARIN LEVEL (UNFRACTIONATED): Heparin Unfractionated: 0.3 IU/mL (ref 0.30–0.70)

## 2020-10-25 MED ORDER — LORAZEPAM 2 MG/ML IJ SOLN
1.0000 mg | INTRAMUSCULAR | Status: DC | PRN
  Administered 2020-10-25 – 2020-11-01 (×3): 1 mg via INTRAVENOUS
  Filled 2020-10-25 (×3): qty 1

## 2020-10-25 MED ORDER — INSULIN ASPART 100 UNIT/ML ~~LOC~~ SOLN
0.0000 [IU] | SUBCUTANEOUS | Status: DC
  Administered 2020-10-25 – 2020-10-26 (×4): 4 [IU] via SUBCUTANEOUS
  Administered 2020-10-26: 3 [IU] via SUBCUTANEOUS
  Administered 2020-10-26 – 2020-10-27 (×3): 4 [IU] via SUBCUTANEOUS
  Administered 2020-10-27: 7 [IU] via SUBCUTANEOUS
  Administered 2020-10-27 – 2020-10-28 (×2): 3 [IU] via SUBCUTANEOUS
  Administered 2020-10-28: 11 [IU] via SUBCUTANEOUS
  Administered 2020-10-28 (×3): 7 [IU] via SUBCUTANEOUS
  Administered 2020-10-28: 11 [IU] via SUBCUTANEOUS
  Administered 2020-10-29: 05:00:00 7 [IU] via SUBCUTANEOUS
  Administered 2020-10-29 (×2): 3 [IU] via SUBCUTANEOUS
  Administered 2020-10-29: 08:00:00 4 [IU] via SUBCUTANEOUS
  Administered 2020-10-30 (×2): 7 [IU] via SUBCUTANEOUS
  Administered 2020-10-30 – 2020-10-31 (×7): 4 [IU] via SUBCUTANEOUS

## 2020-10-25 MED ORDER — INSULIN ASPART 100 UNIT/ML ~~LOC~~ SOLN
0.0000 [IU] | Freq: Three times a day (TID) | SUBCUTANEOUS | Status: DC
  Administered 2020-10-25: 7 [IU] via SUBCUTANEOUS
  Administered 2020-10-25: 11 [IU] via SUBCUTANEOUS

## 2020-10-25 MED ORDER — INSULIN DETEMIR 100 UNIT/ML ~~LOC~~ SOLN
32.0000 [IU] | Freq: Two times a day (BID) | SUBCUTANEOUS | Status: DC
  Administered 2020-10-25 – 2020-10-26 (×3): 32 [IU] via SUBCUTANEOUS
  Filled 2020-10-25 (×5): qty 0.32

## 2020-10-25 MED ORDER — ROSUVASTATIN CALCIUM 5 MG PO TABS
10.0000 mg | ORAL_TABLET | Freq: Every day | ORAL | Status: DC
  Administered 2020-10-25 – 2020-10-28 (×3): 10 mg
  Filled 2020-10-25 (×5): qty 2

## 2020-10-25 MED ORDER — INSULIN DETEMIR 100 UNIT/ML ~~LOC~~ SOLN
10.0000 [IU] | Freq: Once | SUBCUTANEOUS | Status: AC
  Administered 2020-10-25: 10 [IU] via SUBCUTANEOUS
  Filled 2020-10-25: qty 0.1

## 2020-10-25 MED ORDER — ALBUMIN HUMAN 25 % IV SOLN
25.0000 g | Freq: Once | INTRAVENOUS | Status: AC
  Administered 2020-10-25: 25 g via INTRAVENOUS
  Filled 2020-10-25: qty 100

## 2020-10-25 NOTE — Progress Notes (Signed)
eLink Physician-Brief Progress Note Patient Name: Ian [name] DOB: 1956-01-25 MRN: [id number]   Date of Service  10/25/2020  HPI/Events of Note  Notified of increasing pressor requirement from 5 to 15 of norepinephrine. Fentanyl deceased to 25 from 30, Precedex 0.5 from 0.8. Net zero on CRRT. MAP now 70  eICU Interventions  Ordered albumin 25 g     Intervention Category Major Interventions: Hypotension - evaluation and management  Ian [name] 10/25/2020, 1:21 AM

## 2020-10-25 NOTE — Progress Notes (Signed)
Progress Note  Patient Name: Ian [name] Date of Encounter: 10/25/2020  [facility] Cardiologist: Sherren Mocha, MD   Subjective   Patient intubated but wakes up easily and follows commands.  Denies pain.  Inpatient Medications    Scheduled Meds: . aspirin  81 mg Per Tube Daily  . chlorhexidine gluconate (MEDLINE KIT)  15 mL Mouth Rinse BID  . Chlorhexidine Gluconate Cloth  6 each Topical Daily  . clopidogrel  75 mg Per Tube Daily  . docusate  100 mg Per Tube BID  . feeding supplement (PROSource TF)  90 mL Per Tube TID  . guaiFENesin  20 mL Per Tube Q8H  . insulin aspart  0-20 Units Subcutaneous TID WC  . insulin aspart  7 Units Subcutaneous Q4H  . insulin detemir  22 Units Subcutaneous Q12H  . levalbuterol  0.63 mg Nebulization Q6H  . mouth rinse  15 mL Mouth Rinse 10 times per day  . multivitamin with minerals  1 tablet Per Tube Daily  . pantoprazole sodium  40 mg Per Tube Daily  . polyethylene glycol  17 g Per Tube Daily  . rosuvastatin  40 mg Per Tube QHS   Continuous Infusions: .  prismasol BGK 4/2.5 500 mL/hr at 10/25/20 0046  .  prismasol BGK 4/2.5 300 mL/hr at 10/24/20 1416  . amiodarone 60 mg/hr (10/25/20 0900)  . dexmedetomidine (PRECEDEX) IV infusion 0.5 mcg/kg/hr (10/25/20 0900)  . feeding supplement (VITAL 1.5 CAL) 1,000 mL (10/24/20 1754)  . fentaNYL infusion INTRAVENOUS 75 mcg/hr (10/25/20 0900)  . heparin 1,450 Units/hr (10/25/20 0900)  . nitroGLYCERIN Stopped (10/21/20 0839)  . norepinephrine (LEVOPHED) Adult infusion 15 mcg/min (10/25/20 0900)  . phenylephrine (NEO-SYNEPHRINE) Adult infusion Stopped (10/21/20 1626)  . prismasol BGK 4/2.5 1,500 mL/hr at 10/25/20 0414   PRN Meds: acetaminophen (TYLENOL) oral liquid 160 mg/5 mL, fentaNYL, heparin, metoprolol tartrate, ondansetron (ZOFRAN) IV, sodium chloride, sodium chloride flush   Vital Signs    Vitals:   10/25/20 0815 10/25/20 0820 10/25/20 0824 10/25/20 0900  BP: (!) 118/58   117/64   Pulse: (!) 108     Resp: (!) 26   (!) 26  Temp:   98 F (36.7 C)   TempSrc:   Oral   SpO2: 100% 100%    Weight:      Height:        Intake/Output Summary (Last 24 hours) at 10/25/2020 0956 Last data filed at 10/25/2020 0900 Gross per 24 hour  Intake 4746.67 ml  Output 4131 ml  Net 615.67 ml   Last 3 Weights 10/25/2020 10/24/2020 10/23/2020  Weight (lbs) 204 lb 9.4 oz 200 lb 9.9 oz 195 lb 5.2 oz  Weight (kg) 92.8 kg 91 kg 88.6 kg      Telemetry    Normal sinus rhythm/sinus tachycardia with PVCs - Personally Reviewed   Physical Exam  Intubated, sedated, wakes up and follows commands, good bilateral grip strength GEN: No acute distress.   Neck:  JVP elevated Cardiac:  Tachycardic and regular, distant heart sounds, no obvious murmur Respiratory:  Coarse breath sounds bilaterally. GI: Soft, nontender, distended, mild tenderness MS:  Diffuse 2+ bilateral pretibial edema; No deformity. Neuro:  Nonfocal  Psych: Difficult to assess, awake and alert  Labs    High Sensitivity Troponin:   Recent Labs  Lab 10/20/20 0121 10/20/20 0324 10/20/20 0608 10/23/20 2345 10/24/20 0210  TROPONINIHS 1,386* 4,619* 5,929* 4,207* 3,736*      Chemistry Recent Labs  Lab 10/19/20 0940 10/19/20  1505 10/19/20 2146 10/20/20 0324 10/22/20 0410 10/23/20 0507 10/24/20 0210 10/24/20 0210 10/24/20 1652 10/25/20 0027 10/25/20 0347  NA 135  --  133*   < > 133*   < > 134*   < > 134* 137 134*  K 4.4   < > 4.3   < > 3.8   < > 3.6   < > 3.8 3.6 3.8  CL 98   < > 97*   < > 101   < > 103  --  103  --  99  CO2 24   < > 19*   < > 19*   < > 16*  --  16*  --  20*  GLUCOSE 425*  --  431*   < > 352*   < > 200*  --  258*  --  286*  BUN 25  --  24*   < > 52*   < > 91*  --  87*  --  61*  CREATININE 0.88   < > 1.30*   < > 2.48*   < > 4.37*  --  4.14*  --  3.02*  CALCIUM 9.4   < > 9.2   < > 7.8*   < > 7.7*  --  7.4*  --  7.9*  PROT  --   --  6.8  --  5.3*  --   --   --   --   --   --   ALBUMIN  --    < >  3.3*  --  2.0*  --   --   --  2.4*  --  2.7*  AST  --   --  24  --  259*  --   --   --   --   --   --   ALT  --   --  25  --  170*  --   --   --   --   --   --   ALKPHOS  --   --  118  --  174*  --   --   --   --   --   --   BILITOT  --   --  1.2  --  0.4  --   --   --   --   --   --   GFRNONAA 91  --  >60   < > 28*   < > 14*  --  15*  --  22*  GFRAA 106  --   --   --   --   --   --   --   --   --   --   ANIONGAP  --   --  17*   < > 13   < > 15  --  15  --  15   < > = values in this interval not displayed.     Hematology Recent Labs  Lab 10/23/20 2345 10/23/20 2345 10/24/20 0210 10/25/20 0027 10/25/20 0347  WBC 12.2*  --  11.6*  --  16.0*  RBC 3.20*  --  3.07*  --  2.98*  HGB 8.1*   < > 8.0* 7.5* 7.7*  HCT 25.8*   < > 24.2* 22.0* 22.5*  MCV 80.6  --  78.8*  --  75.5*  MCH 25.3*  --  26.1  --  25.8*  MCHC 31.4  --  33.1  --  34.2  RDW 16.9*  --  17.0*  --  17.2*  PLT 301  --  272  --  311   < > = values in this interval not displayed.    BNP Recent Labs  Lab 10/19/20 2146 10/22/20 0410  BNP 688.7* 1,460.1*     DDimer No results for input(s): DDIMER in the last 168 hours.   Radiology    DG Chest Port 1 View  Result Date: 10/25/2020 CLINICAL DATA:  Endotracheal tube in the setting of respiratory failure EXAM: PORTABLE CHEST 1 VIEW COMPARISON:  October 24, 2020 FINDINGS: LEFT-sided Port-A-Cath terminates in the area of the caval to atrial junction. RIGHT IJ central venous line in similar position. Vascular catheter entering via LEFT IJ approach terminates at the midportion of the RIGHT atrium with similar appearance to previous imaging. Gastric tube courses through in off the field of the radiograph. Image rotated slightly to the RIGHT. Accounting for this endotracheal tube position stable at approximately 3 cm above the level of the carina. Post median sternotomy and CABG. Heart size accentuated by portable technique, grossly stable. Diffuse interstitial and airspace  opacities with more confluent airspace disease in the retrocardiac region and RIGHT lung base as on the prior study. Graded opacity in the RIGHT chest. Focal nodular density in the LEFT lung apex as before. Background pulmonary metastatic lesions better seen on recent chest CT. On limited assessment no acute skeletal process. IMPRESSION: 1. Stable appearance of support apparatus as described. 2. Diffuse interstitial and airspace opacities with more confluent airspace disease in the retrocardiac region and RIGHT lung base. Findings are unchanged accounting for differences in technique. Electronically Signed   By: Zetta Bills M.D.   On: 10/25/2020 07:39   DG Chest Port 1 View  Result Date: 10/24/2020 CLINICAL DATA:  Line placement. EXAM: PORTABLE CHEST 1 VIEW COMPARISON:  10/23/2020 chest radiograph and prior. FINDINGS: New left IJ CVC tip overlies the right atrium. Remaining support lines and tubes are unchanged. Postsurgical appearance of the cardiomediastinal silhouette. Decreased conspicuity of confluent/patchy and nodular bilateral pulmonary opacities. No pneumothorax. Small bilateral effusions, unchanged. IMPRESSION: 1. New left IJ CVC tip overlies the right atrium.  No pneumothorax. 2. Remaining support lines and tubes are unchanged. 3. Decreased conspicuity of bilateral pulmonary opacities. Electronically Signed   By: Primitivo Gauze M.D.   On: 10/24/2020 11:31   DG Chest Port 1 View  Result Date: 10/23/2020 CLINICAL DATA:  Chest pain EXAM: PORTABLE CHEST 1 VIEW COMPARISON:  October 24, 2019 FINDINGS: The right-sided central venous catheter is well position. The left-sided subclavian Port-A-Cath is stable in positioning. There is no pneumothorax. Bilateral hazy airspace opacities are noted. Multiple pulmonary nodules are again noted. The heart size remains enlarged. There are bilateral pleural effusions with vascular congestion and pulmonary edema. The enteric tube extends below the left  hemidiaphragm. The endotracheal tube terminates above the carina. IMPRESSION: 1. Lines and tubes as above. 2. No significant interval change. Electronically Signed   By: Constance Holster M.D.   On: 10/23/2020 23:54     Patient Profile     64 y.o. male with history of severe multi-vessel CAD s/p CABG and multiple prior PCIs/stents, metastatic colon cancer with pulmonary and hepatic mets, ischemic cardiomyopathy admitted with chest pain and dyspnea and found to have pneumonia, elevated troponin, atrial fib with RVR. He has been treated with IV heparin and amiodarone drip. He has required pressor support.   Assessment & Plan    1.  Atrial fibrillation with RVR: Telemetry reviewed and  demonstrates conversion to sinus rhythm yesterday at about noon.  He now has sinus tachycardia with a heart rate ranging from 100 to 105 bpm with PVCs.  He remains on IV amiodarone, would continue this.  Continue IV heparin 2.  Mixed cardiogenic and distributive shock: Supported with Levophed, currently stable on a dose of 11 mics.  Appreciate CCM management.  Blood pressure has been limiting factor with volume removal. 3.  Acute oliguric renal failure: Appreciate nephrology care, notes reviewed, tolerating 50 to 100 mL/h net ultrafiltration. 4.  Non-STEMI: In background of multiorgan failure and shock.  Conservative management is appropriate.  Has had increasing angina leading up to his hospitalization. 5.  Acute on chronic systolic heart failure: Echo reviewed with severe LV dysfunction, LVEF 30 to 35%, anteroapical akinesis.  Clearly ischemic cardiomyopathy is the cause of his heart failure.  Again, medical therapy is appropriate.  Unable to take beta-blocker/ACE/ARB/ARNI under current circumstances with shock and renal failure. 6.  Goals of care: Discussed with the palliative medicine team.  Goal is to remove fluid with hope of extubation and home with hospice.  I think we will know more over the next 24 to 48 hours.   I am hopeful that if he can tolerate fluid removal he will be able to extubate and go home with comfort care.  If he has any significant setbacks a full comfort approach would be reasonable.  We will continue discussions with family.  The patient is critically ill with multiple organ systems failure and requires high complexity decision making for assessment and support, frequent evaluation and titration of therapies, application of advanced monitoring technologies and extensive interpretation of multiple databases.   Critical Care Time devoted to patient care services described in this note is 40 minutes.  For questions or updates, please contact [place] Please consult [email or web address] for contact info under        Signed, Sherren Mocha, MD  10/25/2020, 9:56 AM

## 2020-10-25 NOTE — Progress Notes (Signed)
NAME:  Ian [name], MRN:  [id number], DOB:  [birth year], LOS: 6 ADMISSION DATE:  10/19/2020, CONSULTATION DATE:  10/21/2020 REFERRING MD: Beau Fanny , CHIEF COMPLAINT:  Airway and vent management   Brief History   64 year old male with history of NSTEMI 09/30/2020, treated with conservative management with increasing nitrates and adding Ranexa.  11/10 he was diagnosed with multiple new hepatic and pulmonary metastatic areas with mild anterior bladder wall thickening.  He was referred to oncology with plan to start chemo next week.  He had an ED visit 11/30, and returned 10/19/2020 due to chest pain is occurring multiple times per day, at rest and with exertion. He was unable to perform any task without angina. He was admitted by the cardiology service. CTA was done which was + for extensive multifocal pulmonary infiltrate and dense consolidation of the right middle lobe most in keeping with acute infection. Small associated right parapneumonic effusion.He was place on BiPAP 10/21/2020 for desats on HFNC.  PCCM have been consulted for high risk for  intubation and vent management.    History of present illness   Ian [name] is a 64 year old male with hx of CAD s/p CABG 2008, STEMI 08/2019 s/p DES x2 to L radial-PDA with DES x 2 to protected LM into LCx, repeat STEMI 12/2019 s/p DES to L radial-PDA, DM, GERD, HTN, HLD, ICM, PAD s/p R fempop BPG 02/2020, PAF (noted after FPBPG), and colon CA with recent new  dx of new hepatic and pulmonary metastasis (prior mets to liver s/p partial colectomy andchemotherapy).  Admitted for NSTEMI 09/30/2020, treated with conservative management with increasing nitrates and adding Ranexa.  He had an ED visit 11/30, and returned 10/19/2020 due to chest pain is occurring multiple times per day, at rest and with exertion. He was unable to perform any task without angina. He was admitted by the cardiology service. CTA was done which was + for extensive multifocal pulmonary  infiltrate and dense consolidation of the right middle lobe most in keeping with acute infection. Small associated right parapneumonic effusion. He was place on BiPAP 10/21/2020 for desats on HFNC.  PCCM have been consulted for high risk for  intubation and vent management.   Past Medical History    Past Medical History:  Diagnosis Date  . 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 Colon cancer ([place])   . Complication of anesthesia    atypical pseudo-cholinesterase deficiency  . Coronary artery disease 01/10/2013   s/p CABG 2008 // s/p STEMI in 08/2019>>DES x 2 to L radial-PDA; staged PCI: DES x 2 to protected LM into LCx // s/p inf-lat STEMI 12/2019 >> DES to L Radial-PDA  . Diabetes mellitus   . Family Hx of adverse reaction to anesthesia    sister also has atypical pseudo-cholinesterase deficiency  . Gastroesophageal reflux disease   . History of heart bypass surgery   . HLD (hyperlipidemia)   . Hypertension   . Ischemic cardiomyopathy    Echo 01/04/2020: EF 45-50, mild LVH, Gr 2 DD, normal RVSF, mild to mod LAE, mild MR, mild AS (mean gradient 6 mmHg)  . Mild aortic stenosis   . Mild mitral regurgitation   . Myocardial infarction ([place])   . PAD (peripheral artery disease) ([facility])    s/p R fem-pop bypass 02/2020 (Dr. Donnetta Hutching)  . Paroxysmal atrial fibrillation ([facility])    CHADS-VASc=3 // noted post op after fem pop bypass in 02/2020; due to high risk, Apixaban started  Significant Hospital Events   10/19/2020 Admission Recent admission 09/30/2020-10/01/2020 ED Visit 10/18/2020 12/6 still in shock, still volume overloaded.  Renal function and acid-base worse.  Nephrology consulted.  Starting CRRT.  Consults:  10/21/2020 PCCM  Procedures:  10/21/2020 Intubation  Significant Diagnostic Tests:  CTA  10/20/2020 Interval development of extensive multifocal pulmonary infiltrate and dense consolidation of the right middle lobe most in keeping with acute infection. Small associated right  parapneumonic effusion. No central obstructing lesion. Multiple bilateral pulmonary nodules are identified, but partially obscured, in keeping with pulmonary metastatic disease. Mild left ventricular dilation. No pulmonary embolism. Aortic Atherosclerosis (ICD10-I70.0).  Heart cath 01/04/20: 1. Severe multivessel coronary artery disease with chronic total occlusion of the LAD and RCA, continued patency of the left mainstem and left circumflex stents with focal mild to moderate in-stent restenosis at the transition of the left main into the circumflex. 2. Status post aortocoronary bypass surgery with continued patency of the LIMA to LAD graft and severe aorto ostial stenosis of the radial graft to PDA treated successfully with PCI using a 3.0 x 30 mm resolute Onyx DES with intravascular ultrasound guidance 3. Nonobstructive ostial left circumflex restenosis as demonstrated by intravascular ultrasound with a minimal lumen area of 5.96 mm  Recommendations: Long-term dual antiplatelet therapy with aspirin and Effient, continued aggressive medical therapy, 2D echocardiogram for assessment of LV systolic function.   Echo 09/30/20: 1. Left ventricular ejection fraction, by estimation, is 40 to 45%. The  left ventricle has mildly decreased function. The left ventricle  demonstrates global hypokinesis. Left ventricular diastolic parameters are  consistent with Grade II diastolic  dysfunction (pseudonormalization).  2. Right ventricular systolic function is normal. The right ventricular  size is normal. There is normal pulmonary artery systolic pressure. The  estimated right ventricular systolic pressure is 46.5 mmHg.  3. Left atrial size was moderately dilated.  4. The mitral valve is normal in structure. Mild mitral valve  regurgitation. No evidence of mitral stenosis.  5. The aortic valve is tricuspid. Aortic valve regurgitation is not  visualized. Mild aortic valve stenosis.  Aortic valve mean gradient  measures 11.0 mmHg.  6. The inferior vena cava is dilated in size with >50% respiratory  variability, suggesting right atrial pressure of 8 mmHg.  7. Technically difficult study with very poor images even with Definity.  For more accurate EF determination, would consider cardiac MR.   Micro Data:  10/20/2020 Blood Cx: pending 10/19/2020 Covid 19 Negative 10/19/2020 Influenza A&B Negative  Antimicrobials:  Azithromycin 10/21/2020>> Rocephin 10/21/2020>>  Interim history/subjective:  No events. Remains awake on vent. Unfortunately fluid removal limited by Bps.  Objective   Blood pressure 110/69, pulse (!) 108, temperature 98 F (36.7 C), temperature source Oral, resp. rate (!) 27, height 5\' 5"  (1.651 m), weight 92.8 kg, SpO2 100 %. CVP:  [10 mmHg-32 mmHg] 16 mmHg  Vent Mode: PRVC FiO2 (%):  [40 %] 40 % Set Rate:  [26 bmp] 26 bmp Vt Set:  [600 mL] 600 mL PEEP:  [5 cmH20-8 cmH20] 5 cmH20 Plateau Pressure:  [17 cmH20-27 cmH20] 26 cmH20   Intake/Output Summary (Last 24 hours) at 10/25/2020 1035 Last data filed at 10/25/2020 1000 Gross per 24 hour  Intake 4799.29 ml  Output 4331 ml  Net 468.29 ml   Filed Weights   10/23/20 0145 10/24/20 0500 10/25/20 0600  Weight: 88.6 kg 91 kg 92.8 kg    Examination: Constitutional: ill appearing man on vent  Eyes: EOMI, pupils equal Ears, nose, mouth,  and throat: ETT in place, small thick secretions Cardiovascular: now regular, ext lukewarm, 1-2+ LE edema Respiratory: harsh rhonci at bases, clear at apices Gastrointestinal: soft, +BS Skin: No rashes, normal turgor Neurologic: moves all 4 ext to command Psychiatric: RASS 0  Sugars high Net +1L H/H down slightly WBC slightly up   Resolved Hospital Problem list     Assessment & Plan:  Acute Respiratory Failure in setting of extensive multifocal pulmonary infiltrates and dense consolidation of the right middle lobe working diagnosis pneumonia (NOS),  superimposed on pulmonary metastasis and complicated by volume overload  - Work on volume removal - Completing 5 days abx - Vent support until we get better volume status  Non-ST elevation MI in context of known CAD s/p CABG with severe disease in 3/4 grafts; complicated by Acute on Chronic systolic HF Atrial Fibrillation with RVR on amiodarone Cardiorenal and ATN leading to acute renal failure - BB on hold with shock - Fluid removal with CRRT - Continue amiodarone and heparin gtt - Maintain normal electrolytes  Anemia w/out evidence of bleeding -Trend CBC -Transfuse for hgb < 7  Hyperglycemia- increase levemir, continue TF coverage and SSI  Metastatic Colon Cancer with new involvement Lung and Liver Seen by oncology, plan for chemo - I think goal now is try to get him off vent and on home hospice.  I do not think he will be a salvage chemo candidate given his advanced heart and kidney failure  Best practice (evaluated daily)   Diet: TF Pain/Anxiety/Delirium protocol (if indicated): Precedex and Fentanyl VAP protocol (if indicated): initiated DVT prophylaxis: Heparin gtt GI prophylaxis: Protonix Glucose control: see above Mobility: BR last date of multidisciplinary goals of care discussion: Completed once again on 12/7; NP, MD, patient and his son Code Status: Full Disposition: ICU  38 minutes critical care time Erskine Emery MD PCCM

## 2020-10-25 NOTE — Progress Notes (Signed)
Inpatient Diabetes Program Recommendations  AACE/ADA: New Consensus Statement on Inpatient Glycemic Control (2015)  Target Ranges:  Prepandial:   less than 140 mg/dL      Peak postprandial:   less than 180 mg/dL (1-2 hours)      Critically ill patients:  140 - 180 mg/dL   Lab Results  Component Value Date   GLUCAP 210 (H) 10/25/2020   HGBA1C 13.2 (H) 10/19/2020    Review of Glycemic Control Results for Ian [name], Ian [name] (MRN [id number]) as of 10/25/2020 10:01  Ref. Range 10/24/2020 15:32 10/24/2020 19:49 10/25/2020 00:24 10/25/2020 03:53 10/25/2020 08:21  Glucose-Capillary Latest Ref Range: 70 - 99 mg/dL 225 (H) 214 (H) 236 (H) 273 (H) 210 (H)   Diabetes history: DM 2 Outpatient Diabetes medications: Farxiga 10 mg daily, Novolog 70/30 mix 20 units tid with meals, Basaglar 40 units q HS, Metformin 500 mg bid Current orders for Inpatient glycemic control: Levemir 22 units bid + Novolog 7 units q 4 hrs. Tube feed coverage + Novolog 0-20 units tid meals  Inpatient Diabetes Program Recommendations:   -Change Novolog correction to q 4 hrs. While on tube feeds -Increase Levemir to 24 units bid  Thank you, Nani Gasser. Hania Cerone, RN, MSN, CDE  Diabetes Coordinator Inpatient Glycemic Control Team Team Pager [phone] (8am-5pm) 10/25/2020 10:03 AM

## 2020-10-25 NOTE — Progress Notes (Signed)
CPT held at this time due to CRRT. RT will continue to monitor.

## 2020-10-25 NOTE — Progress Notes (Signed)
Daily Progress Note   Patient Name: Ian [name]       Date: 10/25/2020 DOB: 1956-03-19  Age: 64 y.o. MRN#: [id number] Attending Physician: Ian Furbish, MD Primary Care Physician: Ian Sites, MD Admit Date: 10/19/2020  Reason for Consultation/Follow-up:  To discuss complex medical decision making related to patient's goals of care.  Hospice discussion.  Subjective:  Met with patient, wife and son Ian [name] at beside.  Discussed current condition.  Family knows he is critical and very tenuous.  Talked about a successful discharge would be going home with [facility] support.  Wife indicates that there is a good amount of family support and she would want to take him home for his last days - weeks if that is his wish.  Patient wants to be at home for 1 more Christmas.  Ian [name] played a video of patient's favored grandson, Ian [name], play and tune on his instrument for grandfather.  Will continue to hope for the best - but in the interest of being prepared for the worst, I asked Ian [name] and Ian [name] to consider if they get a call that he has declined significantly or is in distress - would they want [place] to focus on comfort or to push forward?  Family will give this consideration but appeared to lean towards not wanting Ian [name] to suffer further.    Assessment: Patient beginning to tolerate effective CRRT.  CO2 level decreasing.  Patient continues to be alert and orientated on the vent.   Patient Profile/HPI:  [name] "Ian [name]" is a 64 y.o. male with multiple medical problems including CAD status post CABG ischemic cardiomyopathy with EF of 40 to 45%, PAD status post right femoropopliteal bypass, paroxysmal AF, and stage IV colorectal cancer metastatic to liver (initially diagnosed (08/2018)  status post neoadjuvant FOLFIRI chemotherapy and partial colectomy.  Patient was lost to oncology follow-up. Patient was hospitalized 09/30/2020-10/01/2020 with unstable angina. CT of abdomen and pelvis (11/10) and CT of the chest (11/18) revealed multiple new liver and pulmonary metastases.  Patient was again seen by outpatient medical oncology and plan was to resume FOLFIRI and bevacizumab.  Patient is now readmitted 10/20/2020 with unstable angina and pneumonia.  He developed hypoxic respiratory failure requiring intubation.  Palliative care is consulted to address goals.  Length of Stay: 6  Vital Signs: BP 117/64   Pulse (!) 108   Temp 98 F (36.7 C) (Oral)   Resp (!) 26   Ht _0  (1.651 m)   Wt 92.8 kg   SpO2 100%   BMI 34.05 kg/m  SpO2: SpO2: 100 % O2 Device: O2 Device: Ventilator O2 Flow Rate: O2 Flow Rate (L/min): 8 L/min       Palliative Assessment/Data: 20%     Palliative Care Plan    Recommendations/Plan:  Continue current care.  With continued improvement we are hopeful he will discharge home with [facility] support  If he develops distress family would likely shift to comfort.  Code Status:  DNR  Prognosis:  Days to weeks.  Discharge Planning:  Home with Hospice vs Hospital death.  Care plan was discussed with Dr. Burt Knack, patient and family.  Thank you for allowing the Palliative Medicine Team to assist in the care of this patient.  Total time spent:  35 min.     Greater than 50%  of this time was spent counseling and coordinating care related to the above assessment and plan.  Ian Jenny, PA-C Palliative Medicine  Please contact Palliative MedicineTeam phone at [phone] for questions and concerns between 7 am - 7 pm.   Please see AMION for individual provider pager numbers.

## 2020-10-25 NOTE — Progress Notes (Signed)
RT NOTE: CPT held at this time due to CRRT. Vitals are stable. RT will continue to monitor.

## 2020-10-25 NOTE — Progress Notes (Signed)
eLink Physician-Brief Progress Note Patient Name: ETAI COPADO DOB: 1956/10/19 MRN: [id number]   Date of Service  10/25/2020  HPI/Events of Note  Notified of glucose 200s. On moderate dose SSI and Levemir 22 units BID started last night  eICU Interventions  Increase SSI to resistant scale. May need further increase in basal insulin dose     Intervention Category Major Interventions: Hyperglycemia - active titration of insulin therapy  Judd Lien 10/25/2020, 4:53 AM

## 2020-10-25 NOTE — Progress Notes (Signed)
[facility] Progress Note   64 year old male  CAD s/p CABG 2008, STEMI 10/20 s/p DES x2 to L radial PDA with DES x2 to protected LM into LCx, STEMi 2/21 s/p DES to L radial PDA, DM, GERD, HTN, HLD, ICM, PAD s/p R fem-pop BPG 4/21, PAF, and metastatic colon cancer with hepatic and pulmonary metastasis with prior mets to liver s/p partial colectomy and chemotherapy p/w PNA, CHF consulted for CRRT.    Assessment/ Plan:   1.  AKI 2/2 cardiorenal syndrome vs ATN:  1. BL Cr around 1.0. Patient has had severe ischemic cardiomyopathy and mixed cardiogenic and distributive shock while admitted. Currently on norepinephrine drip for blood pressure. Cr worsening and now up to 4.3 today. Has had a net input of 11.4 L since admission, weight increased from 80 kg to 92.8 kg. Has been requiring increasing amounts of PEEP and still requiring oxygen.  2. Not  candidate for long term dialysis however given his respiratory status and goals to attempt to spend his last remaining time with his family it appears reasonable to attempt CRRT for volume removal in an attempt to wean of the ventilator. 3. Appreciate CCM placing Left IJ temp on 12/6. 4. Did not tolerate UF yest with increasing pressor requirements; currently tolerating 50-100 ml / hr net UF. Even with CRRT may be difficult to get the volume off.  5. Seen by palliative and now DNR.   2. Metabolic acidosis: Started CRRT.  3. Acute hypoxic respiratory failure multifactorial from pneumonia, pulmonary metastasis and volume overload: 1. Currently intubated. CXR on 12/5 showed right lower lobe consolidation, multiple nodular opacities through out. Currently on azithromycin and ceftriaxone. Pulmonology following and attempting to wean off ventilator.  4. NSTEMI: History of CAD s/p multiple DES, history of acute on chronic systolic heart failure and cardiogenic shock: Continued on heparin and ASA, treating medically for now. Cardiology on board.   5. Afib with RVR: Currently on amiodarone drip, ASA, and digoxin. Remains tachycardic. Considering cardioversion in next 24 hours.  6. Metastatic colon cancer with mets to lung and liver: Per primary, f/u with oncology on discharge for possible palliative chemotherapy if he has improvement 7. Microcytic anemia: Hgb 8.0 today. BL around 10. Likely 2/2 AoCD.  8. Hyperglycemia: Per primary  Subjective:   No net UF overnight bec of issues with hypotension; required albumin x2 and finally able to UF this AM.   Objective:   BP (!) 117/56   Pulse (!) 110   Temp 97.9 F (36.6 C) (Oral)   Resp (!) 25   Ht '5\' 5"'  (1.651 m)   Wt 92.8 kg   SpO2 100%   BMI 34.05 kg/m   Intake/Output Summary (Last 24 hours) at 10/25/2020 0719 Last data filed at 10/25/2020 0700 Gross per 24 hour  Intake 4643.9 ml  Output 3664 ml  Net 979.9 ml   Weight change: 1.8 kg  Physical Exam: General appearance: intubated Head: NCAT Neck: +JVD Cardio: irregularly irregular rhythm GI: soft, non-tender; bowel sounds normal Extremities: Trace-1+ BL LE swelling, sacral edema noted Pulses: 2+ and symmetric Neurologic: Follows commands  Imaging: DG Chest 1 View  Result Date: 10/23/2020 CLINICAL DATA:  Follow-up ARDS EXAM: CHEST  1 VIEW COMPARISON:  10/22/2020 FINDINGS: Endotracheal tube and gastric catheter are again seen and stable. Left chest wall port and right jugular central line are again seen and stable. Lungs again demonstrate bilateral airspace opacity primarily in the bases. Small effusions are noted. Vascular congestion  is seen but somewhat improved when compared with the prior exam. IMPRESSION: Stable bibasilar opacities. Interval improvement in aeration in the upper lobes. Electronically Signed   By: Inez Catalina M.D.   On: 10/23/2020 10:24   DG Chest Port 1 View  Result Date: 10/24/2020 CLINICAL DATA:  Line placement. EXAM: PORTABLE CHEST 1 VIEW COMPARISON:  10/23/2020 chest radiograph and prior. FINDINGS:  New left IJ CVC tip overlies the right atrium. Remaining support lines and tubes are unchanged. Postsurgical appearance of the cardiomediastinal silhouette. Decreased conspicuity of confluent/patchy and nodular bilateral pulmonary opacities. No pneumothorax. Small bilateral effusions, unchanged. IMPRESSION: 1. New left IJ CVC tip overlies the right atrium.  No pneumothorax. 2. Remaining support lines and tubes are unchanged. 3. Decreased conspicuity of bilateral pulmonary opacities. Electronically Signed   By: Primitivo Gauze M.D.   On: 10/24/2020 11:31   DG Chest Port 1 View  Result Date: 10/23/2020 CLINICAL DATA:  Chest pain EXAM: PORTABLE CHEST 1 VIEW COMPARISON:  October 24, 2019 FINDINGS: The right-sided central venous catheter is well position. The left-sided subclavian Port-A-Cath is stable in positioning. There is no pneumothorax. Bilateral hazy airspace opacities are noted. Multiple pulmonary nodules are again noted. The heart size remains enlarged. There are bilateral pleural effusions with vascular congestion and pulmonary edema. The enteric tube extends below the left hemidiaphragm. The endotracheal tube terminates above the carina. IMPRESSION: 1. Lines and tubes as above. 2. No significant interval change. Electronically Signed   By: Constance Holster M.D.   On: 10/23/2020 23:54    Labs: BMET Recent Labs  Lab 10/20/20 2220 10/20/20 2220 10/21/20 0252 10/21/20 0252 10/21/20 1024 10/21/20 1609 10/21/20 1609 10/22/20 0410 10/22/20 1955 10/23/20 0507 10/23/20 1027 10/23/20 1828 10/24/20 0210 10/24/20 1652 10/25/20 0027 10/25/20 0347  NA 133*   < > 134*   < >  --  134*  --  133*  --  134*  --   --  134* 134* 137 134*  K 3.1*   < > 3.5   < >  --  4.9   < > 3.8  --  3.3* 3.7  --  3.6 3.8 3.6 3.8  CL 98  --  99  --   --   --   --  101  --  102  --   --  103 103  --  99  CO2 23  --  22  --   --   --   --  19*  --  18*  --   --  16* 16*  --  20*  GLUCOSE 221*  --  160*  --   --    --   --  352*  --  336*  --   --  200* 258*  --  286*  BUN 30*  --  31*  --   --   --   --  52*  --  76*  --   --  91* 87*  --  61*  CREATININE 1.23  --  1.31*  --   --   --   --  2.48*  --  3.34*  --   --  4.37* 4.14*  --  3.02*  CALCIUM 8.4*  --  8.3*  --   --   --   --  7.8*  --  7.5*  --   --  7.7* 7.4*  --  7.9*  PHOS  --   --   --   --  4.6  --   --  3.2 3.3 4.0  --  4.3  --  4.9*  --  3.5   < > = values in this interval not displayed.   CBC Recent Labs  Lab 10/19/20 2146 10/20/20 0324 10/23/20 0507 10/23/20 0507 10/23/20 2345 10/24/20 0210 10/25/20 0027 10/25/20 0347  WBC 18.1*   < > 13.5*  --  12.2* 11.6*  --  16.0*  NEUTROABS 15.0*  --   --   --   --   --   --   --   HGB 11.2*   < > 8.7*   < > 8.1* 8.0* 7.5* 7.7*  HCT 36.0*   < > 27.2*   < > 25.8* 24.2* 22.0* 22.5*  MCV 82.0   < > 80.2  --  80.6 78.8*  --  75.5*  PLT 349   < > 284  --  301 272  --  311   < > = values in this interval not displayed.    Medications:    . aspirin  81 mg Per Tube Daily  . chlorhexidine gluconate (MEDLINE KIT)  15 mL Mouth Rinse BID  . Chlorhexidine Gluconate Cloth  6 each Topical Daily  . clopidogrel  75 mg Per Tube Daily  . docusate  100 mg Per Tube BID  . feeding supplement (PROSource TF)  90 mL Per Tube TID  . guaiFENesin  20 mL Per Tube Q8H  . insulin aspart  0-20 Units Subcutaneous TID WC  . insulin aspart  7 Units Subcutaneous Q4H  . insulin detemir  22 Units Subcutaneous Q12H  . levalbuterol  0.63 mg Nebulization Q6H  . mouth rinse  15 mL Mouth Rinse 10 times per day  . multivitamin with minerals  1 tablet Per Tube Daily  . pantoprazole sodium  40 mg Per Tube Daily  . polyethylene glycol  17 g Per Tube Daily  . rosuvastatin  40 mg Per Tube QHS      Otelia Santee, MD 10/25/2020, 7:19 AM

## 2020-10-25 NOTE — Progress Notes (Signed)
[place] for Heparin Indication: chest pain/ACS  Allergies  Allergen Reactions  . Anectine [Succinylcholine] Other (See Comments)    Atypical pseudocholinesterase deficiency  . Nsaids Other (See Comments)    Contraindicated (can only have asa 81 mg)    Patient Measurements: Height: 5\' 5"  (165.1 cm) Weight: 92.8 kg (204 lb 9.4 oz) IBW/kg (Calculated) : 61.5 Heparin Dosing Weight: 75.9 kg  Vital Signs: Temp: 98 F (36.7 C) (12/07 0824) Temp Source: Oral (12/07 0824) BP: 110/69 (12/07 1000) Pulse Rate: 108 (12/07 0815)  Labs: Recent Labs    10/23/20 0507 10/23/20 0507 10/23/20 2345 10/23/20 2345 10/24/20 0210 10/24/20 0210 10/24/20 1652 10/25/20 0027 10/25/20 0347  HGB 8.7*   < > 8.1*   < > 8.0*   < >  --  7.5* 7.7*  HCT 27.2*   < > 25.8*   < > 24.2*  --   --  22.0* 22.5*  PLT 284   < > 301  --  272  --   --   --  311  APTT 78*  --   --   --  60*  --   --   --   --   HEPARINUNFRC 0.48  --   --   --  0.27*  --   --   --  0.30  CREATININE 3.34*   < >  --   --  4.37*  --  4.14*  --  3.02*  TROPONINIHS  --   --  4,207*  --  3,736*  --   --   --   --    < > = values in this interval not displayed.    Estimated Creatinine Clearance: 26.2 mL/min (A) (by C-G formula based on SCr of 3.02 mg/dL (H)).  Assessment: 64 y.o. male admitted with chest pain and history of atrial fibrillation on Xarelto prior to admission. Xarelto is on hold and patient transitioned to IV Heparin. Currently following aPTT values due to effect of recent Xarelto on heparin levels.   Heparin level at low end of goal this morning (0.3), heparin increased to  1450 units/hr yesterday. Will increase slightly. No overt bleeding noted.  CBC is low but stable at Hgb 7.7, plt normal.   Heparin off briefly for HD line placement, did have some bleeding from line but now resolved. MD aware.   Goal of Therapy:  Heparin level 0.3-0.7 units/mL Monitor platelets by  anticoagulation protocol: Yes   Plan:  Will increase heparin to 1500 units/hr  Daily heparin level, CBC  Erin Hearing PharmD., BCPS Clinical Pharmacist 10/25/2020 10:52 AM  [facility] pharmacy phone numbers are listed on [email or web address]

## 2020-10-26 ENCOUNTER — Ambulatory Visit (HOSPITAL_COMMUNITY): Payer: Federal, State, Local not specified - PPO | Admitting: Hematology and Oncology

## 2020-10-26 ENCOUNTER — Other Ambulatory Visit (HOSPITAL_COMMUNITY): Payer: Federal, State, Local not specified - PPO

## 2020-10-26 ENCOUNTER — Ambulatory Visit (HOSPITAL_COMMUNITY): Payer: Federal, State, Local not specified - PPO

## 2020-10-26 DIAGNOSIS — I2510 Atherosclerotic heart disease of native coronary artery without angina pectoris: Secondary | ICD-10-CM

## 2020-10-26 LAB — RENAL FUNCTION PANEL
Albumin: 2.5 g/dL — ABNORMAL LOW (ref 3.5–5.0)
Albumin: 2.5 g/dL — ABNORMAL LOW (ref 3.5–5.0)
Anion gap: 14 (ref 5–15)
Anion gap: 13 (ref 5–15)
BUN: 42 mg/dL — ABNORMAL HIGH (ref 8–23)
BUN: 44 mg/dL — ABNORMAL HIGH (ref 8–23)
CO2: 21 mmol/L — ABNORMAL LOW (ref 22–32)
CO2: 23 mmol/L (ref 22–32)
Calcium: 7.9 mg/dL — ABNORMAL LOW (ref 8.9–10.3)
Calcium: 8.4 mg/dL — ABNORMAL LOW (ref 8.9–10.3)
Chloride: 99 mmol/L (ref 98–111)
Chloride: 100 mmol/L (ref 98–111)
Creatinine, Ser: 2.07 mg/dL — ABNORMAL HIGH (ref 0.61–1.24)
Creatinine, Ser: 1.93 mg/dL — ABNORMAL HIGH (ref 0.61–1.24)
GFR, Estimated: 35 mL/min — ABNORMAL LOW (ref >60)
GFR, Estimated: 38 mL/min — ABNORMAL LOW (ref >60)
Glucose, Bld: 157 mg/dL — ABNORMAL HIGH (ref 70–99)
Glucose, Bld: 85 mg/dL (ref 70–99)
Phosphorus: 3.3 mg/dL (ref 2.5–4.6)
Phosphorus: 3.2 mg/dL (ref 2.5–4.6)
Potassium: 4.7 mmol/L (ref 3.5–5.1)
Potassium: 4.6 mmol/L (ref 3.5–5.1)
Sodium: 134 mmol/L — ABNORMAL LOW (ref 135–145)
Sodium: 136 mmol/L (ref 135–145)

## 2020-10-26 LAB — CBC
HCT: 22 % — ABNORMAL LOW (ref 39.0–52.0)
Hemoglobin: 7.3 g/dL — ABNORMAL LOW (ref 13.0–17.0)
MCH: 25.4 pg — ABNORMAL LOW (ref 26.0–34.0)
MCHC: 33.2 g/dL (ref 30.0–36.0)
MCV: 76.7 fL — ABNORMAL LOW (ref 80.0–100.0)
Platelets: 257 10*3/uL (ref 150–400)
RBC: 2.87 MIL/uL — ABNORMAL LOW (ref 4.22–5.81)
RDW: 17.5 % — ABNORMAL HIGH (ref 11.5–15.5)
WBC: 14.1 10*3/uL — ABNORMAL HIGH (ref 4.0–10.5)
nRBC: 0.6 % — ABNORMAL HIGH (ref 0.0–0.2)

## 2020-10-26 LAB — GLUCOSE, CAPILLARY
Glucose-Capillary: 107 mg/dL — ABNORMAL HIGH (ref 70–99)
Glucose-Capillary: 111 mg/dL — ABNORMAL HIGH (ref 70–99)
Glucose-Capillary: 115 mg/dL — ABNORMAL HIGH (ref 70–99)
Glucose-Capillary: 121 mg/dL — ABNORMAL HIGH (ref 70–99)
Glucose-Capillary: 151 mg/dL — ABNORMAL HIGH (ref 70–99)
Glucose-Capillary: 165 mg/dL — ABNORMAL HIGH (ref 70–99)
Glucose-Capillary: 79 mg/dL (ref 70–99)

## 2020-10-26 LAB — HEPARIN LEVEL (UNFRACTIONATED): Heparin Unfractionated: 0.31 IU/mL (ref 0.30–0.70)

## 2020-10-26 LAB — MAGNESIUM: Magnesium: 2.7 mg/dL — ABNORMAL HIGH (ref 1.7–2.4)

## 2020-10-26 MED ORDER — CHLORHEXIDINE GLUCONATE 0.12 % MT SOLN
OROMUCOSAL | Status: AC
  Administered 2020-10-26: 15 mL via OROMUCOSAL
  Filled 2020-10-26: qty 15

## 2020-10-26 MED ORDER — NUTRISOURCE FIBER PO PACK
1.0000 | PACK | Freq: Two times a day (BID) | ORAL | Status: DC
  Administered 2020-10-26 – 2020-10-27 (×3): 1
  Filled 2020-10-26 (×4): qty 1

## 2020-10-26 MED ORDER — B COMPLEX-C PO TABS
1.0000 | ORAL_TABLET | Freq: Every day | ORAL | Status: DC
  Administered 2020-10-26 – 2020-10-28 (×3): 1
  Filled 2020-10-26 (×3): qty 1

## 2020-10-26 NOTE — Progress Notes (Addendum)
[facility] Progress Note   64 year old male  CAD s/p CABG 2008, STEMI 10/20 s/p DES x2 to L radial PDA with DES x2 to protected LM into LCx, STEMi 2/21 s/p DES to L radial PDA, DM, GERD, HTN, HLD, ICM, PAD s/p R fem-pop BPG 4/21, PAF, and metastatic colon cancer with hepatic and pulmonary metastasis with prior mets to liver s/p partial colectomy and chemotherapy p/w PNA, CHF consulted for CRRT.    Assessment/ Plan:   1.  AKI 2/2 cardiorenal syndrome vs ATN:  1. BL Cr around 1.0. Patient has had severe ischemic cardiomyopathy and mixed cardiogenic and distributive shock while admitted. Currently on norepinephrine drip for blood pressure. Cr worsening with attempted diuresis. Currently still +9941m during this hospitalization.  Has been requiring increasing amounts of PEEP and still requiring oxygen.  2. Not  candidate for long term dialysis however given his respiratory status and goals to attempt to spend his last remaining time with his family it appears reasonable to attempt CRRT for volume removal in an attempt to wean of the ventilator. 3. Appreciate CCM placing Left IJ temp on 12/6. 4. Has been better tolerating UF however is still requiring pressors. Finally with better progress and had 5,144 cc out yesterday. Currently tolerating 140 ml / hr net UF, 4/4/4 pre/dial/post. Even with CRRT may be difficult to get all the volume off.  Continue CRRT for now with goal to stop in the next 48hr.. 5. Seen by palliative and now DNR.   2. Metabolic acidosis: On CRRT.  3. Acute hypoxic respiratory failure multifactorial from pneumonia, pulmonary metastasis and volume overload: 1. Currently intubated. CXR on 12/5 showed right lower lobe consolidation, multiple nodular opacities through out. Currently on azithromycin and ceftriaxone.  2. Pulmonology following and attempting to wean off ventilator. Able to wean to pressure support of 5 today however only tolerated for 20 minutes, will  attempt again later.  4. NSTEMI: History of CAD s/p multiple DES, history of acute on chronic systolic heart failure and cardiogenic shock: Continued on heparin and ASA, treating medically for now. Cardiology on board.  5. Afib with RVR: Currently on amiodarone drip, ASA, and digoxin. Remains tachycardic.  6. Metastatic colon cancer with mets to lung and liver: Per primary, f/u with oncology on discharge for possible palliative chemotherapy if he has improvement 7. Microcytic anemia: Hgb 7.3 today. BL around 10. Likely 2/2 AoCD.  8. Hyperglycemia: Per primary  I have seen and examined this patient and agree with the plan of care.   LDwana Melena MD 10/26/2020, 10:42 AM   Subjective:   Patient and wife seen at bedside. Patient denied any pain, nausea, chest pain, or discomfort.   Objective:   BP 108/70   Pulse 100   Temp 99.3 F (37.4 C) (Axillary)   Resp 18   Ht '5\' 5"'  (1.651 m)   Wt 90.2 kg   SpO2 100%   BMI 33.09 kg/m   Intake/Output Summary (Last 24 hours) at 10/26/2020 [id number] data filed at 10/26/2020 0900 Gross per 24 hour  Intake 3215.33 ml  Output 5291 ml  Net -2075.67 ml   Weight change: -2.6 kg  Physical Exam: General appearance: intubated, follows commands Head: NCAT Neck: +JVD, RIJ and LIJ in place Cardio: irregularly irregular rhythm GI: soft, non-tender; bowel sounds normal Extremities: Trace-1+ BL LE swelling, sacral edema noted Pulses: 2+ and symmetric Neurologic: Follows commands  Imaging: DG Chest Port 1 View  Result Date: 10/25/2020 CLINICAL  DATA:  Endotracheal tube in the setting of respiratory failure EXAM: PORTABLE CHEST 1 VIEW COMPARISON:  October 24, 2020 FINDINGS: LEFT-sided Port-A-Cath terminates in the area of the caval to atrial junction. RIGHT IJ central venous line in similar position. Vascular catheter entering via LEFT IJ approach terminates at the midportion of the RIGHT atrium with similar appearance to previous imaging. Gastric tube  courses through in off the field of the radiograph. Image rotated slightly to the RIGHT. Accounting for this endotracheal tube position stable at approximately 3 cm above the level of the carina. Post median sternotomy and CABG. Heart size accentuated by portable technique, grossly stable. Diffuse interstitial and airspace opacities with more confluent airspace disease in the retrocardiac region and RIGHT lung base as on the prior study. Graded opacity in the RIGHT chest. Focal nodular density in the LEFT lung apex as before. Background pulmonary metastatic lesions better seen on recent chest CT. On limited assessment no acute skeletal process. IMPRESSION: 1. Stable appearance of support apparatus as described. 2. Diffuse interstitial and airspace opacities with more confluent airspace disease in the retrocardiac region and RIGHT lung base. Findings are unchanged accounting for differences in technique. Electronically Signed   By: Zetta Bills M.D.   On: 10/25/2020 07:39   DG Chest Port 1 View  Result Date: 10/24/2020 CLINICAL DATA:  Line placement. EXAM: PORTABLE CHEST 1 VIEW COMPARISON:  10/23/2020 chest radiograph and prior. FINDINGS: New left IJ CVC tip overlies the right atrium. Remaining support lines and tubes are unchanged. Postsurgical appearance of the cardiomediastinal silhouette. Decreased conspicuity of confluent/patchy and nodular bilateral pulmonary opacities. No pneumothorax. Small bilateral effusions, unchanged. IMPRESSION: 1. New left IJ CVC tip overlies the right atrium.  No pneumothorax. 2. Remaining support lines and tubes are unchanged. 3. Decreased conspicuity of bilateral pulmonary opacities. Electronically Signed   By: Primitivo Gauze M.D.   On: 10/24/2020 11:31    Labs: BMET Recent Labs  Lab 10/22/20 0410 10/22/20 0410 10/22/20 1955 10/23/20 0507 10/23/20 0507 10/23/20 1027 10/23/20 1828 10/24/20 0210 10/24/20 1652 10/25/20 0027 10/25/20 0347 10/25/20 1715  10/26/20 0401  NA 133*   < >  --  134*  --   --   --  134* 134* 137 134* 135 134*  K 3.8   < >  --  3.3*   < > 3.7  --  3.6 3.8 3.6 3.8 4.2 4.7  CL 101  --   --  102  --   --   --  103 103  --  99 100 99  CO2 19*  --   --  18*  --   --   --  16* 16*  --  20* 21* 21*  GLUCOSE 352*  --   --  336*  --   --   --  200* 258*  --  286* 191* 157*  BUN 52*  --   --  76*  --   --   --  91* 87*  --  61* 44* 42*  CREATININE 2.48*  --   --  3.34*  --   --   --  4.37* 4.14*  --  3.02* 2.29* 2.07*  CALCIUM 7.8*  --   --  7.5*  --   --   --  7.7* 7.4*  --  7.9* 7.9* 7.9*  PHOS 3.2   < > 3.3 4.0  --   --  4.3  --  4.9*  --  3.5 3.3 3.3   < > =  values in this interval not displayed.   CBC Recent Labs  Lab 10/19/20 2146 10/20/20 0324 10/23/20 2345 10/23/20 2345 10/24/20 0210 10/25/20 0027 10/25/20 0347 10/26/20 0401  WBC 18.1*   < > 12.2*  --  11.6*  --  16.0* 14.1*  NEUTROABS 15.0*  --   --   --   --   --   --   --   HGB 11.2*   < > 8.1*   < > 8.0* 7.5* 7.7* 7.3*  HCT 36.0*   < > 25.8*   < > 24.2* 22.0* 22.5* 22.0*  MCV 82.0   < > 80.6  --  78.8*  --  75.5* 76.7*  PLT 349   < > 301  --  272  --  311 257   < > = values in this interval not displayed.    Medications:    . aspirin  81 mg Per Tube Daily  . chlorhexidine gluconate (MEDLINE KIT)  15 mL Mouth Rinse BID  . Chlorhexidine Gluconate Cloth  6 each Topical Daily  . clopidogrel  75 mg Per Tube Daily  . docusate  100 mg Per Tube BID  . feeding supplement (PROSource TF)  90 mL Per Tube TID  . guaiFENesin  20 mL Per Tube Q8H  . insulin aspart  0-20 Units Subcutaneous Q4H  . insulin aspart  7 Units Subcutaneous Q4H  . insulin detemir  32 Units Subcutaneous BID  . levalbuterol  0.63 mg Nebulization Q6H  . mouth rinse  15 mL Mouth Rinse 10 times per day  . multivitamin with minerals  1 tablet Per Tube Daily  . pantoprazole sodium  40 mg Per Tube Daily  . polyethylene glycol  17 g Per Tube Daily  . rosuvastatin  10 mg Per Tube QHS     Asencion Noble, M.D. [facility] 10/26/2020 9:44 AM

## 2020-10-26 NOTE — Progress Notes (Signed)
[place] for Heparin Indication: chest pain/ACS  Allergies  Allergen Reactions  . Anectine [Succinylcholine] Other (See Comments)    Atypical pseudocholinesterase deficiency  . Nsaids Other (See Comments)    Contraindicated (can only have asa 81 mg)    Patient Measurements: Height: 5\' 5"  (165.1 cm) Weight: 90.2 kg (198 lb 13.7 oz) IBW/kg (Calculated) : 61.5 Heparin Dosing Weight: 75.9 kg  Vital Signs: Temp: 99.3 F (37.4 C) (12/08 0800) Temp Source: Axillary (12/08 0800) BP: 108/62 (12/08 1400) Pulse Rate: 96 (12/08 1400)  Labs: Recent Labs    10/23/20 2345 10/23/20 2345 10/24/20 0210 10/24/20 1652 10/25/20 0027 10/25/20 0027 10/25/20 0347 10/25/20 1715 10/26/20 0401  HGB 8.1*   < > 8.0*  --  7.5*   < > 7.7*  --  7.3*  HCT 25.8*   < > 24.2*  --  22.0*  --  22.5*  --  22.0*  PLT 301   < > 272  --   --   --  311  --  257  APTT  --   --  60*  --   --   --   --   --   --   HEPARINUNFRC  --   --  0.27*  --   --   --  0.30  --  0.31  CREATININE  --   --  4.37*   < >  --   --  3.02* 2.29* 2.07*  TROPONINIHS 4,207*  --  3,736*  --   --   --   --   --   --    < > = values in this interval not displayed.    Estimated Creatinine Clearance: 37.7 mL/min (A) (by C-G formula based on SCr of 2.07 mg/dL (H)).  Assessment: 64 y.o. male admitted with chest pain and history of atrial fibrillation on Xarelto prior to admission. Xarelto is on hold and patient transitioned to IV Heparin. Currently following aPTT values due to effect of recent Xarelto on heparin levels.   Heparin level at low end of goal this morning (0.31), heparin increased to  1500 units/hr yesterday. No overt bleeding noted.  CBC is low but stable at Hgb 7.3, plt normal.   Goal of Therapy:  Heparin level 0.3-0.7 units/mL Monitor platelets by anticoagulation protocol: Yes   Plan:  Will continue heparin at 1500 units/hr  Daily heparin level, CBC  Erin Hearing PharmD.,  BCPS Clinical Pharmacist 10/26/2020 2:58 PM  [facility] pharmacy phone numbers are listed on [email or web address]

## 2020-10-26 NOTE — Progress Notes (Signed)
Nutrition Follow-up  DOCUMENTATION CODES:   Non-severe (moderate) malnutrition in context of chronic illness  INTERVENTION:   Add Nutrisource fiber BID  Continue tube feeding:  -Vital 1.5 @ 50 ml/hr (1200 ml/day) via OG -ProSource TF 90 ml TID -B complex with Vitamin C to account for losses with CRRT  Tube feeding regimen provides 2040 kcal, 147 grams of protein, and 917 ml of H2O.   NUTRITION DIAGNOSIS:   Moderate Malnutrition related to chronic illness (colon cancer, CAD s/p CABG) as evidenced by mild fat depletion, mild muscle depletion, severe muscle depletion.  Ongoing  GOAL:   Patient will meet greater than or equal to 90% of their needs  Addressed via TF  MONITOR:   Vent status, Labs, Weight trends, TF tolerance, I & O's  REASON FOR ASSESSMENT:   Malnutrition Screening Tool    ASSESSMENT:   Ian [name] is a 64 y.o. male with PMH of severe CAD s/p CABG (2008) c/b several STEMIs (10/20 and 02/21), T2DM, GERD, HTN, HLD, metastatic colon cancer.Undergoing chemo and had prior partial colectomy. Admitted for worsening chest pain, SOB, and PNA concerns.  12/03- intubated, bronch 12/06- start CRRT  Pt discussed during ICU rounds and with RN.   Requiring levophed. Continuing CRRT for volume removal with plan to extubate and go home with hospice. Receiving Vital 1.5 @ 50 ml/hr. Original plan for Cortrak today but given GOC will continue feeding via OG. Noted liquid stool this am. Trial Nutrisource fiber BID.   Admission weight: 88.6 kg  Current weight: 90.2 kg   Patient is currently intubated on ventilator support MV: 16.3 L/min Temp (24hrs), Avg:98.3 F (36.8 C), Min:97.8 F (36.6 C), Max:99.3 F (37.4 C)  CRRT: 5144 ml x 24 hrs   Drips: precedex, levophed  Medications: colace, SS novolog, levemir, miralax Labs: Na 134 (L) Mg 2.7 (H) CBG 107-199  Diet Order:   Diet Order            Diet NPO time specified  Diet effective now                  EDUCATION NEEDS:   No education needs have been identified at this time  Skin:  Skin Assessment: Reviewed RN Assessment  Last BM:  12/8  Height:   Ht Readings from Last 1 Encounters:  10/20/20 5\' 5"  (1.651 m)    Weight:   Wt Readings from Last 1 Encounters:  10/26/20 90.2 kg    Ideal Body Weight:  59 kg  BMI:  Body mass index is 33.09 kg/m.  Estimated Nutritional Needs:   Kcal:  1907 kcal  Protein:  145-160 grams  Fluid:  >/= 1.9 L/day   Mariana Single RD, LDN Clinical Nutrition Pager listed in [place]

## 2020-10-26 NOTE — Progress Notes (Signed)
S/p liquid bowel movement

## 2020-10-26 NOTE — Progress Notes (Addendum)
10/26/2020   I have seen and evaluated the patient for heart failure, renal failure, metastatic cancer on ventilator.  S:  No events, finally able to get off some fluid with CRRT.  O: Blood pressure 116/63, pulse 100, temperature 99.3 F (37.4 C), temperature source Axillary, resp. rate (!) 24, height 5\' 5"  (1.651 m), weight 90.2 kg, SpO2 100 %.  Awake and alert on vent, gets into distress with PS trials Continued gross anasarca Ext warm Lungs with rhonci bilaterally Moves all 4 ext to command  A:  -Acute hypoxemic respiratory failure secondary to volume overload, ?aspiration pneumonia, and underlying stage IV colon cancer mets to lung. -Severe refractory angina, NSTEMI, underlying ischemic cardiomyopathy -Acute renal failure from ATN and cardiorenal syndrome -DM2 with CBGs improved -Approaching end of life  P:  - Continue to try to get fluid off with CRRT, goal to get home with hospice; appreciate Dr. Aron Baba help - Continue vent support for now - Ongoing GOC discussions with palliative, family, and PCCM   Patient critically ill due to respiratory failure Interventions to address this today ventilator titration, CRRT Risk of deterioration without these interventions is high  I personally spent 35 minutes providing critical care not including any separately billable procedures  Erskine Emery MD [facility] 10/26/2020 1:54 PM Personal pager: [phone] If unanswered, please page CCM On-call: [phone]       NAME:  Ian [name], MRN:  [id number], DOB:  1956/06/02, LOS: 7 ADMISSION DATE:  10/19/2020, CONSULTATION DATE:  10/21/2020 REFERRING MD: Beau Fanny , CHIEF COMPLAINT:  Airway and vent management   Brief History   65 year old male with history of NSTEMI 09/30/2020, treated with conservative management with increasing nitrates and adding Ranexa.  11/10 he was diagnosed with multiple new hepatic and pulmonary metastatic areas with mild anterior  bladder wall thickening.  He was referred to oncology with plan to start chemo next week.  He had an ED visit 11/30, and returned 10/19/2020 due to chest pain is occurring multiple times per day, at rest and with exertion. He was unable to perform any task without angina. He was admitted by the cardiology service. CTA was done which was + for extensive multifocal pulmonary infiltrate and dense consolidation of the right middle lobe most in keeping with acute infection. Small associated right parapneumonic effusion.He was place on BiPAP 10/21/2020 for desats on HFNC.  PCCM have been consulted for high risk for  intubation and vent management.     Past Medical History    Past Medical History:  Diagnosis Date  . 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 Colon cancer ([place])   . Complication of anesthesia    atypical pseudo-cholinesterase deficiency  . Coronary artery disease 01/10/2013   s/p CABG 2008 // s/p STEMI in 08/2019>>DES x 2 to L radial-PDA; staged PCI: DES x 2 to protected LM into LCx // s/p inf-lat STEMI 12/2019 >> DES to L Radial-PDA  . Diabetes mellitus   . Family Hx of adverse reaction to anesthesia    sister also has atypical pseudo-cholinesterase deficiency  . Gastroesophageal reflux disease   . History of heart bypass surgery   . HLD (hyperlipidemia)   . Hypertension   . Ischemic cardiomyopathy    Echo 01/04/2020: EF 45-50, mild LVH, Gr 2 DD, normal RVSF, mild to mod LAE, mild MR, mild AS (mean gradient 6 mmHg)  . Mild aortic stenosis   . Mild mitral regurgitation   . Myocardial infarction ([place])   .  PAD (peripheral artery disease) ([facility])    s/p R fem-pop bypass 02/2020 (Dr. Donnetta Hutching)  . Paroxysmal atrial fibrillation ([facility])    CHADS-VASc=3 // noted post op after fem pop bypass in 02/2020; due to high risk, Apixaban started    [facility] Events   10/19/2020 Admission Recent admission 09/30/2020-10/01/2020 ED Visit 10/18/2020 12/6 still in shock, still volume overloaded.   Renal function and acid-base worse.  Nephrology consulted.  Starting CRRT. 12/7 minimal volume removed 12/8 looks more comfortable, still over 9 L positive, briefly tolerated pressure support ventilation, heart rate better controlled than it had been.  Overall looking better but not ready for extubation  Consults:  10/21/2020 PCCM  Procedures:  10/21/2020 Intubation 12/6 left IJ HD catheter placed Significant Diagnostic Tests:  CTA  10/20/2020 Interval development of extensive multifocal pulmonary infiltrate and dense consolidation of the right middle lobe most in keeping with acute infection. Small associated right parapneumonic effusion. No central obstructing lesion. Multiple bilateral pulmonary nodules are identified, but partially obscured, in keeping with pulmonary metastatic disease. Mild left ventricular dilation. No pulmonary embolism. Aortic Atherosclerosis (ICD10-I70.0).  Heart cath 01/04/20: 1. Severe multivessel coronary artery disease with chronic total occlusion of the LAD and RCA, continued patency of the left mainstem and left circumflex stents with focal mild to moderate in-stent restenosis at the transition of the left main into the circumflex. 2. Status post aortocoronary bypass surgery with continued patency of the LIMA to LAD graft and severe aorto ostial stenosis of the radial graft to PDA treated successfully with PCI using a 3.0 x 30 mm resolute Onyx DES with intravascular ultrasound guidance 3. Nonobstructive ostial left circumflex restenosis as demonstrated by intravascular ultrasound with a minimal lumen area of 5.96 mm  Recommendations: Long-term dual antiplatelet therapy with aspirin and Effient, continued aggressive medical therapy, 2D echocardiogram for assessment of LV systolic function.   Echo 09/30/20: 1. Left ventricular ejection fraction, by estimation, is 40 to 45%. The  left ventricle has mildly decreased function. The left ventricle   demonstrates global hypokinesis. Left ventricular diastolic parameters are  consistent with Grade II diastolic  dysfunction (pseudonormalization).  2. Right ventricular systolic function is normal. The right ventricular  size is normal. There is normal pulmonary artery systolic pressure. The  estimated right ventricular systolic pressure is 25.9 mmHg.  3. Left atrial size was moderately dilated.  4. The mitral valve is normal in structure. Mild mitral valve  regurgitation. No evidence of mitral stenosis.  5. The aortic valve is tricuspid. Aortic valve regurgitation is not  visualized. Mild aortic valve stenosis. Aortic valve mean gradient  measures 11.0 mmHg.  6. The inferior vena cava is dilated in size with >50% respiratory  variability, suggesting right atrial pressure of 8 mmHg.  7. Technically difficult study with very poor images even with Definity.  For more accurate EF determination, would consider cardiac MR.   Micro Data:  10/20/2020 Blood Cx: pending 10/19/2020 Covid 19 Negative 10/19/2020 Influenza A&B Negative  Antimicrobials:  Azithromycin 10/21/2020>> completed 5 days Rocephin 10/21/2020>> pleated 5 days  Interim history/subjective:  Appears comfortable, we did attempt pressure support ventilation tidal volumes initially in the 500 range on pressure support of 5, however he did endorse some dyspnea, titrated up to 12 of pressure support, volumes excellent however only lasted about 20 minutes before needing to go back on full support  Objective   Blood pressure 102/63, pulse 94, temperature 99.3 F (37.4 C), temperature source Axillary, resp. rate (Abnormal) 26, height 5'  5" (1.651 m), weight 90.2 kg, SpO2 100 %. CVP:  [16 mmHg-26 mmHg] 26 mmHg  Vent Mode: PRVC FiO2 (%):  [40 %] 40 % Set Rate:  [26 bmp] 26 bmp Vt Set:  [600 mL] 600 mL PEEP:  [5 cmH20] 5 cmH20 Plateau Pressure:  [20 cmH20-23 cmH20] 23 cmH20   Intake/Output Summary (Last 24 hours) at 10/26/2020  0847 Last data filed at 10/26/2020 0800 Gross per 24 hour  Intake 3025.39 ml  Output 5331 ml  Net -2305.61 ml   Filed Weights   10/24/20 0500 10/25/20 0600 10/26/20 0600  Weight: 91 kg 92.8 kg 90.2 kg    Examination:  General 64 year old white male currently on full support HEENT normocephalic atraumatic sclera nonicteric left IJ HD catheter unremarkable, right IJ triple-lumen catheter unremarkable, mucous membranes are moist, orally intubated Pulmonary: Some scattered rhonchi, on pressure support attempts tidal volumes in the 5-700 range depending on degree of pressure support, only lasted about 20 minutes on pressure support of 12 before reporting dyspnea and going back on full support Cardiac: Regular irregular with systolic murmur appreciated.  Heart rate better controlled then 48 hours ago Abdomen: Soft not tender no organomegaly Extremities: Warm and dry, capillary refill brisk, no longer mottled or cool Neuro awake, appropriate, moves all extremities but does exhibit generalized weakness GU concentrated yellow urine  Resolved Hospital Problem list     Assessment & Plan:  Acute Respiratory Failure in setting of extensive multifocal pulmonary infiltrates and dense consolidation of the right middle lobe working diagnosis pneumonia (NOS), superimposed on pulmonary metastasis and complicated by volume overload  Portable chest x-ray from 12/7 demonstrates Endotracheal tube as well as right IJ and left HD catheter all in satisfactory position there are diffuse bilateral nodule opacities, improving aeration particularly in right lower lobe consolidation -Completed 5 days of antibiotics Plan Continue volume removal VAP bundle PAD protocol Daily assessment for weaning, with hope to extubate when volume status maximized He is DNR, once extubated, palliative care has been following family prepared that he could die in the hospital  Non-ST elevation MI in context of known CAD s/p CABG  with severe disease in 3/4 grafts; complicated by Acute on Chronic systolic HF Atrial Fibrillation with RVR on amiodarone Cardiorenal and ATN leading to acute renal failure -> Serum creatinine improved, BUN improved,Still over 9 L positive seems to be tolerating CRRT Plan Holding beta-blockade while in shock Continue volume removal with CRRT No change in amiodarone and heparin drip Ensure normal potassium and magnesium   Anemia w/out evidence of bleeding Anemia/hemoglobin has remained stable no evidence of acute bleeding Plan Trend CBC Transfuse for hemoglobin less than 7   Hyperglycemia Glycemic control improved Plan No current change in Levemir dosing 32 units twice daily, every 4 hour NovoLog 7 units, and sliding scale insulin   Metastatic Colon Cancer with new involvement Lung and Liver Seen by oncology, plan was for chemotherapy, this is no longer an appropriate expectation given end-stage cardiomyopathy   Best practice (evaluated daily)   Diet: TF Pain/Anxiety/Delirium protocol (if indicated): Precedex and Fentanyl VAP protocol (if indicated): initiated DVT prophylaxis: Heparin gtt GI prophylaxis: Protonix Glucose control: see above Mobility: BR last date of multidisciplinary goals of care discussion: Completed once again on 12/7; NP, MD, patient and his son Code Status: DNR.  Goal will be to eventually extubate with no plan for reintubation we are hopeful he will be able to go home with hospice however family is prepared he may die in  the hospital Disposition: ICU  My critical care time is 32 minutes  Erick Colace ACNP-BC [place] Pager # [phone] OR # [phone] if no answer

## 2020-10-26 NOTE — Progress Notes (Signed)
Progress Note  Patient Name: Ian [name] Date of Encounter: 10/26/2020  [facility] Cardiologist: Sherren Mocha, MD   Subjective   Intubated. Awakens easily.   Inpatient Medications    Scheduled Meds: . aspirin  81 mg Per Tube Daily  . chlorhexidine gluconate (MEDLINE KIT)  15 mL Mouth Rinse BID  . Chlorhexidine Gluconate Cloth  6 each Topical Daily  . clopidogrel  75 mg Per Tube Daily  . docusate  100 mg Per Tube BID  . feeding supplement (PROSource TF)  90 mL Per Tube TID  . guaiFENesin  20 mL Per Tube Q8H  . insulin aspart  0-20 Units Subcutaneous Q4H  . insulin aspart  7 Units Subcutaneous Q4H  . insulin detemir  32 Units Subcutaneous BID  . levalbuterol  0.63 mg Nebulization Q6H  . mouth rinse  15 mL Mouth Rinse 10 times per day  . multivitamin with minerals  1 tablet Per Tube Daily  . pantoprazole sodium  40 mg Per Tube Daily  . polyethylene glycol  17 g Per Tube Daily  . rosuvastatin  10 mg Per Tube QHS   Continuous Infusions: .  prismasol BGK 4/2.5 500 mL/hr at 10/26/20 0752  .  prismasol BGK 4/2.5 300 mL/hr at 10/26/20 0053  . amiodarone 60 mg/hr (10/26/20 0809)  . dexmedetomidine (PRECEDEX) IV infusion 0.4 mcg/kg/hr (10/26/20 0800)  . feeding supplement (VITAL 1.5 CAL) 50 mL/hr at 10/26/20 0800  . fentaNYL infusion INTRAVENOUS 50 mcg/hr (10/26/20 0800)  . heparin 1,500 Units/hr (10/26/20 0800)  . norepinephrine (LEVOPHED) Adult infusion 9 mcg/min (10/26/20 0800)  . phenylephrine (NEO-SYNEPHRINE) Adult infusion Stopped (10/21/20 1626)  . prismasol BGK 4/2.5 1,500 mL/hr at 10/26/20 0751   PRN Meds: acetaminophen (TYLENOL) oral liquid 160 mg/5 mL, fentaNYL, heparin, LORazepam, metoprolol tartrate, ondansetron (ZOFRAN) IV, sodium chloride, sodium chloride flush   Vital Signs    Vitals:   10/26/20 0600 10/26/20 0700 10/26/20 0800 10/26/20 0825  BP:  106/83 116/71 102/63  Pulse: (!) 108 (!) 105 (!) 101 94  Resp: (!) 26 (!) 24 (!) 26 (!) 26  Temp:    99.3 F (37.4 C)   TempSrc:   Axillary   SpO2: 97% 100% 100% 100%  Weight: 90.2 kg     Height:        Intake/Output Summary (Last 24 hours) at 10/26/2020 0841 Last data filed at 10/26/2020 0800 Gross per 24 hour  Intake 3025.39 ml  Output 5331 ml  Net -2305.61 ml   Last 3 Weights 10/26/2020 10/25/2020 10/24/2020  Weight (lbs) 198 lb 13.7 oz 204 lb 9.4 oz 200 lb 9.9 oz  Weight (kg) 90.2 kg 92.8 kg 91 kg      Telemetry    sinus- Personally Reviewed  ECG    No AM EKG - Personally Reviewed  Physical Exam   General: intubated, awake HEENT: ET tube in place SKIN: warm, dry. No rashes. Neuro: No focal deficits  Neck: No JVD Lungs:Mechanical breath sounds bilaterally Cardiovascular: Regular rate and rhythm. Soft systolic murmur.  Abdomen:Soft. Bowel sounds present.  Extremities: 1-2+ bilateral lower extremity edema.    Labs    High Sensitivity Troponin:   Recent Labs  Lab 10/20/20 0121 10/20/20 0324 10/20/20 0608 10/23/20 2345 10/24/20 0210  TROPONINIHS 1,386* 4,619* 5,929* 4,207* 3,736*      Chemistry Recent Labs  Lab 10/19/20 0940 10/19/20 0940 10/19/20 2146 10/20/20 0324 10/22/20 0410 10/23/20 0507 10/25/20 0347 10/25/20 1715 10/26/20 0401  NA 135  --  133*   < > 133*   < > 134* 135 134*  K 4.4   < > 4.3   < > 3.8   < > 3.8 4.2 4.7  CL 98   < > 97*   < > 101   < > 99 100 99  CO2 24   < > 19*   < > 19*   < > 20* 21* 21*  GLUCOSE 425*  --  431*   < > 352*   < > 286* 191* 157*  BUN 25  --  24*   < > 52*   < > 61* 44* 42*  CREATININE 0.88   < > 1.30*   < > 2.48*   < > 3.02* 2.29* 2.07*  CALCIUM 9.4   < > 9.2   < > 7.8*   < > 7.9* 7.9* 7.9*  PROT  --   --  6.8  --  5.3*  --   --   --   --   ALBUMIN  --    < > 3.3*  --  2.0*   < > 2.7* 2.6* 2.5*  AST  --   --  24  --  259*  --   --   --   --   ALT  --   --  25  --  170*  --   --   --   --   ALKPHOS  --   --  118  --  174*  --   --   --   --   BILITOT  --   --  1.2  --  0.4  --   --   --   --   GFRNONAA  91  --  >60   < > 28*   < > 22* 31* 35*  GFRAA 106  --   --   --   --   --   --   --   --   ANIONGAP  --   --  17*   < > 13   < > _0 < > = values in this interval not displayed.     Hematology Recent Labs  Lab 10/24/20 0210 10/24/20 0210 10/25/20 0027 10/25/20 0347 10/26/20 0401  WBC 11.6*  --   --  16.0* 14.1*  RBC 3.07*  --   --  2.98* 2.87*  HGB 8.0*   < > 7.5* 7.7* 7.3*  HCT 24.2*   < > 22.0* 22.5* 22.0*  MCV 78.8*  --   --  75.5* 76.7*  MCH 26.1  --   --  25.8* 25.4*  MCHC 33.1  --   --  34.2 33.2  RDW 17.0*  --   --  17.2* 17.5*  PLT 272  --   --  311 257   < > = values in this interval not displayed.    BNP Recent Labs  Lab 10/19/20 2146 10/22/20 0410  BNP 688.7* 1,460.1*     DDimer No results for input(s): DDIMER in the last 168 hours.   Radiology    DG Chest Port 1 View  Result Date: 10/25/2020 CLINICAL DATA:  Endotracheal tube in the setting of respiratory failure EXAM: PORTABLE CHEST 1 VIEW COMPARISON:  October 24, 2020 FINDINGS: LEFT-sided Port-A-Cath terminates in the area of the caval to atrial junction. RIGHT IJ central venous line in similar position. Vascular catheter entering via LEFT IJ approach terminates at  the midportion of the RIGHT atrium with similar appearance to previous imaging. Gastric tube courses through in off the field of the radiograph. Image rotated slightly to the RIGHT. Accounting for this endotracheal tube position stable at approximately 3 cm above the level of the carina. Post median sternotomy and CABG. Heart size accentuated by portable technique, grossly stable. Diffuse interstitial and airspace opacities with more confluent airspace disease in the retrocardiac region and RIGHT lung base as on the prior study. Graded opacity in the RIGHT chest. Focal nodular density in the LEFT lung apex as before. Background pulmonary metastatic lesions better seen on recent chest CT. On limited assessment no acute skeletal process.  IMPRESSION: 1. Stable appearance of support apparatus as described. 2. Diffuse interstitial and airspace opacities with more confluent airspace disease in the retrocardiac region and RIGHT lung base. Findings are unchanged accounting for differences in technique. Electronically Signed   By: Zetta Bills M.D.   On: 10/25/2020 07:39   DG Chest Port 1 View  Result Date: 10/24/2020 CLINICAL DATA:  Line placement. EXAM: PORTABLE CHEST 1 VIEW COMPARISON:  10/23/2020 chest radiograph and prior. FINDINGS: New left IJ CVC tip overlies the right atrium. Remaining support lines and tubes are unchanged. Postsurgical appearance of the cardiomediastinal silhouette. Decreased conspicuity of confluent/patchy and nodular bilateral pulmonary opacities. No pneumothorax. Small bilateral effusions, unchanged. IMPRESSION: 1. New left IJ CVC tip overlies the right atrium.  No pneumothorax. 2. Remaining support lines and tubes are unchanged. 3. Decreased conspicuity of bilateral pulmonary opacities. Electronically Signed   By: Primitivo Gauze M.D.   On: 10/24/2020 11:31    Cardiac Studies   Echo 10/22/20:  1. Left ventricular ejection fraction, by estimation, is 30 to 35%. The  left ventricle has moderately decreased function. The left ventricle  demonstrates regional wall motion abnormalities (see scoring  diagram/findings for description). There is mild  left ventricular hypertrophy. Left ventricular diastolic parameters are  consistent with Grade III diastolic dysfunction (restrictive). Elevated  left atrial pressure.  2. Right ventricular systolic function is normal. The right ventricular  size is normal.  3. The mitral valve is normal in structure. Trivial mitral valve  regurgitation.  4. The aortic valve is calcified. There is moderate calcification of the  aortic valve. Aortic valve regurgitation is not visualized. Mild aortic  valve stenosis.   Patient Profile     64 y.o. male with history of  severe multi-vessel CAD s/p CABG and multiple prior PCIs/stents, metastatic colon cancer with pulmonary and hepatic mets, ischemic cardiomyopathy admitted with chest pain and dyspnea and found to have pneumonia, elevated troponin, atrial fib with RVR. He has been treated with IV heparin and amiodarone drip. He has required pressor support.   Assessment & Plan    1. Atrial fib with RVR: sinus this am. Continue IV amiodarone and IV heparin for now.   2. CAD s/p CABG/Elevated troponin: He has severe CAD. Troponin elevation in the setting of pneumonia and hypotension. LVEF is slightly worse. Continue ASA, Plavix and statin.    3. Acute hypoxic respiratory failure with extensive multi-focal pulmonary infiltrates and pneumonia. Intubated and being followed by the PCCM service.   4. Acute on chronic systolic CHF: Volume removal by CRRT  5. Acute renal failure: Nephrology following  Goals are for volume removal and palliative approach. No new cardiac recommendations today. Plan for home with Hospice when extubated.   For questions or updates, please contact [place] Please consult [email or web address] for contact info under  Signed, Lauree Chandler, MD  10/26/2020, 8:41 AM

## 2020-10-27 ENCOUNTER — Inpatient Hospital Stay (HOSPITAL_COMMUNITY): Payer: Federal, State, Local not specified - PPO

## 2020-10-27 DIAGNOSIS — J90 Pleural effusion, not elsewhere classified: Secondary | ICD-10-CM

## 2020-10-27 LAB — RENAL FUNCTION PANEL
Albumin: 2.5 g/dL — ABNORMAL LOW (ref 3.5–5.0)
Albumin: 2.5 g/dL — ABNORMAL LOW (ref 3.5–5.0)
Anion gap: 13 (ref 5–15)
Anion gap: 14 (ref 5–15)
BUN: 44 mg/dL — ABNORMAL HIGH (ref 8–23)
BUN: 42 mg/dL — ABNORMAL HIGH (ref 8–23)
CO2: 24 mmol/L (ref 22–32)
CO2: 21 mmol/L — ABNORMAL LOW (ref 22–32)
Calcium: 8.6 mg/dL — ABNORMAL LOW (ref 8.9–10.3)
Calcium: 8.2 mg/dL — ABNORMAL LOW (ref 8.9–10.3)
Chloride: 99 mmol/L (ref 98–111)
Chloride: 100 mmol/L (ref 98–111)
Creatinine, Ser: 1.82 mg/dL — ABNORMAL HIGH (ref 0.61–1.24)
Creatinine, Ser: 1.75 mg/dL — ABNORMAL HIGH (ref 0.61–1.24)
GFR, Estimated: 41 mL/min — ABNORMAL LOW
GFR, Estimated: 43 mL/min — ABNORMAL LOW (ref >60)
Glucose, Bld: 86 mg/dL (ref 70–99)
Glucose, Bld: 122 mg/dL — ABNORMAL HIGH (ref 70–99)
Phosphorus: 3.7 mg/dL (ref 2.5–4.6)
Phosphorus: 3.5 mg/dL (ref 2.5–4.6)
Potassium: 4.7 mmol/L (ref 3.5–5.1)
Potassium: 4.8 mmol/L (ref 3.5–5.1)
Sodium: 136 mmol/L (ref 135–145)
Sodium: 135 mmol/L (ref 135–145)

## 2020-10-27 LAB — BODY FLUID CELL COUNT WITH DIFFERENTIAL
Eos, Fluid: 0 %
Lymphs, Fluid: 60 %
Monocyte-Macrophage-Serous Fluid: 11 % — ABNORMAL LOW (ref 50–90)
Neutrophil Count, Fluid: 29 % — ABNORMAL HIGH (ref 0–25)
Total Nucleated Cell Count, Fluid: 541 cu mm (ref 0–1000)

## 2020-10-27 LAB — CBC
HCT: 23.2 % — ABNORMAL LOW (ref 39.0–52.0)
Hemoglobin: 7.5 g/dL — ABNORMAL LOW (ref 13.0–17.0)
MCH: 25.3 pg — ABNORMAL LOW (ref 26.0–34.0)
MCHC: 32.3 g/dL (ref 30.0–36.0)
MCV: 78.1 fL — ABNORMAL LOW (ref 80.0–100.0)
Platelets: 328 10*3/uL (ref 150–400)
RBC: 2.97 MIL/uL — ABNORMAL LOW (ref 4.22–5.81)
RDW: 18 % — ABNORMAL HIGH (ref 11.5–15.5)
WBC: 16 10*3/uL — ABNORMAL HIGH (ref 4.0–10.5)
nRBC: 1.6 % — ABNORMAL HIGH (ref 0.0–0.2)

## 2020-10-27 LAB — GLUCOSE, CAPILLARY
Glucose-Capillary: 80 mg/dL (ref 70–99)
Glucose-Capillary: 145 mg/dL — ABNORMAL HIGH (ref 70–99)
Glucose-Capillary: 65 mg/dL — ABNORMAL LOW (ref 70–99)
Glucose-Capillary: 152 mg/dL — ABNORMAL HIGH (ref 70–99)
Glucose-Capillary: 133 mg/dL — ABNORMAL HIGH (ref 70–99)
Glucose-Capillary: 181 mg/dL — ABNORMAL HIGH (ref 70–99)
Glucose-Capillary: 231 mg/dL — ABNORMAL HIGH (ref 70–99)

## 2020-10-27 LAB — LACTATE DEHYDROGENASE, PLEURAL OR PERITONEAL FLUID
LD, Fluid: 99 U/L — ABNORMAL HIGH (ref 3–23)
LD, Fluid: 98 U/L — ABNORMAL HIGH (ref 3–23)

## 2020-10-27 LAB — PROTEIN, PLEURAL OR PERITONEAL FLUID
Total protein, fluid: <3 g/dL
Total protein, fluid: <3 g/dL

## 2020-10-27 LAB — GRAM STAIN: Special Requests: NORMAL

## 2020-10-27 LAB — HEPARIN LEVEL (UNFRACTIONATED): Heparin Unfractionated: 0.32 IU/mL (ref 0.30–0.70)

## 2020-10-27 LAB — BASIC METABOLIC PANEL WITH GFR
Anion gap: 14 (ref 5–15)
BUN: 45 mg/dL — ABNORMAL HIGH (ref 8–23)
CO2: 23 mmol/L (ref 22–32)
Calcium: 8.6 mg/dL — ABNORMAL LOW (ref 8.9–10.3)
Chloride: 99 mmol/L (ref 98–111)
Creatinine, Ser: 1.84 mg/dL — ABNORMAL HIGH (ref 0.61–1.24)
GFR, Estimated: 41 mL/min — ABNORMAL LOW
Glucose, Bld: 85 mg/dL (ref 70–99)
Potassium: 4.7 mmol/L (ref 3.5–5.1)
Sodium: 136 mmol/L (ref 135–145)

## 2020-10-27 LAB — TYPE AND SCREEN
ABO/RH(D): O NEG
Antibody Screen: NEGATIVE

## 2020-10-27 LAB — MAGNESIUM: Magnesium: 2.8 mg/dL — ABNORMAL HIGH (ref 1.7–2.4)

## 2020-10-27 MED ORDER — DEXMEDETOMIDINE HCL IN NACL 400 MCG/100ML IV SOLN
0.4000 ug/kg/h | INTRAVENOUS | Status: DC
  Administered 2020-10-27: 0.4 ug/kg/h via INTRAVENOUS
  Administered 2020-10-28: 0.5 ug/kg/h via INTRAVENOUS
  Administered 2020-10-28: 0.4 ug/kg/h via INTRAVENOUS
  Administered 2020-10-29: 06:00:00 0.5 ug/kg/h via INTRAVENOUS
  Filled 2020-10-27 (×3): qty 100

## 2020-10-27 MED ORDER — SODIUM CHLORIDE 0.9% FLUSH
10.0000 mL | Freq: Three times a day (TID) | INTRAVENOUS | Status: DC
  Administered 2020-10-27 – 2020-10-31 (×11): 10 mL

## 2020-10-27 MED ORDER — SODIUM BICARBONATE 8.4 % IV SOLN
INTRAVENOUS | Status: AC
  Filled 2020-10-27: qty 50

## 2020-10-27 MED ORDER — SODIUM CHLORIDE 0.9 % IV SOLN
2.0000 g | Freq: Two times a day (BID) | INTRAVENOUS | Status: DC
  Administered 2020-10-27 – 2020-10-31 (×10): 2 g via INTRAVENOUS
  Filled 2020-10-27 (×10): qty 2

## 2020-10-27 MED ORDER — EPINEPHRINE 1 MG/10ML IJ SOSY
PREFILLED_SYRINGE | INTRAMUSCULAR | Status: AC
  Filled 2020-10-27: qty 20

## 2020-10-27 MED ORDER — ALBUMIN HUMAN 5 % IV SOLN
INTRAVENOUS | Status: AC
  Filled 2020-10-27: qty 250

## 2020-10-27 MED ORDER — CALCIUM CHLORIDE 10 % IV SOLN
INTRAVENOUS | Status: AC
  Filled 2020-10-27: qty 10

## 2020-10-27 MED ORDER — VANCOMYCIN HCL 1750 MG/350ML IV SOLN
1750.0000 mg | Freq: Once | INTRAVENOUS | Status: AC
  Administered 2020-10-27: 1750 mg via INTRAVENOUS
  Filled 2020-10-27: qty 350

## 2020-10-27 MED ORDER — SODIUM CHLORIDE 0.9% FLUSH
10.0000 mL | Freq: Three times a day (TID) | INTRAVENOUS | Status: DC
  Administered 2020-10-27 – 2020-10-31 (×11): 10 mL

## 2020-10-27 MED ORDER — PHENYLEPHRINE 40 MCG/ML (10ML) SYRINGE FOR IV PUSH (FOR BLOOD PRESSURE SUPPORT)
PREFILLED_SYRINGE | INTRAVENOUS | Status: AC
  Filled 2020-10-27: qty 10

## 2020-10-27 MED ORDER — DEXTROSE 50 % IV SOLN
12.5000 g | INTRAVENOUS | Status: AC
  Administered 2020-10-27: 12.5 g via INTRAVENOUS
  Filled 2020-10-27: qty 50

## 2020-10-27 MED ORDER — INSULIN DETEMIR 100 UNIT/ML ~~LOC~~ SOLN
12.0000 [IU] | Freq: Two times a day (BID) | SUBCUTANEOUS | Status: DC
  Administered 2020-10-27 – 2020-11-01 (×10): 12 [IU] via SUBCUTANEOUS
  Filled 2020-10-27 (×12): qty 0.12

## 2020-10-27 MED ORDER — VANCOMYCIN HCL IN DEXTROSE 1-5 GM/200ML-% IV SOLN
1000.0000 mg | INTRAVENOUS | Status: DC
  Administered 2020-10-28 – 2020-10-31 (×4): 1000 mg via INTRAVENOUS
  Filled 2020-10-27 (×4): qty 200

## 2020-10-27 NOTE — Progress Notes (Signed)
Pharmacy Antibiotic Note  Ian [name] is a 64 y.o. male  with pneumonia.  Pharmacy has been consulted for vancomycin and cefepime dosing.He is noted on CRRT  Plan: -Cefepime 2gm IV q12h -Vancomycin 1750mg  IV x1 followed by 1000mg  IV q24h -Will follow cultures and clinical progress   Height: 5\' 5"  (165.1 cm) Weight: 85 kg (187 lb 6.3 oz) IBW/kg (Calculated) : 61.5  Temp (24hrs), Avg:98.4 F (36.9 C), Min:98.2 F (36.8 C), Max:98.6 F (37 C)  Recent Labs  Lab 10/23/20 2345 10/24/20 0210 10/24/20 1652 10/25/20 0347 10/25/20 1715 10/26/20 0401 10/26/20 1529 10/27/20 0404  WBC 12.2* 11.6*  --  16.0*  --  14.1*  --  16.0*  CREATININE  --  4.37*   < > 3.02* 2.29* 2.07* 1.93* 1.84*  1.82*   < > = values in this interval not displayed.    Estimated Creatinine Clearance: 41.2 mL/min (A) (by C-G formula based on SCr of 1.84 mg/dL (H)).    Allergies  Allergen Reactions  . Anectine [Succinylcholine] Other (See Comments)    Atypical pseudocholinesterase deficiency  . Nsaids Other (See Comments)    Contraindicated (can only have asa 81 mg)    Antimicrobials this admission Cefepime 12/9>> Vancomycin 12/9>> Azithro 12/2 > 12/7 Ceftriaxone 12/2 > 12/7  Dose adjustments this admission:   Microbiology results: 12/3 trach- neg 12/2 blood x2- neg  Thank you for allowing pharmacy to be a part of this patient's care.  Hildred Laser, PharmD Clinical Pharmacist **Pharmacist phone directory can now be found on [email or web address] ([email or web address]).  Listed under [place].

## 2020-10-27 NOTE — Progress Notes (Signed)
Progress Note  Patient Name: Ian [name] Date of Encounter: 10/27/2020  [facility] Cardiologist: Sherren Mocha, MD   Subjective   Intubated, awake  Inpatient Medications    Scheduled Meds: . aspirin  81 mg Per Tube Daily  . B-complex with vitamin C  1 tablet Per Tube Daily  . chlorhexidine gluconate (MEDLINE KIT)  15 mL Mouth Rinse BID  . Chlorhexidine Gluconate Cloth  6 each Topical Daily  . clopidogrel  75 mg Per Tube Daily  . docusate  100 mg Per Tube BID  . feeding supplement (PROSource TF)  90 mL Per Tube TID  . fiber  1 packet Per Tube BID  . guaiFENesin  20 mL Per Tube Q8H  . insulin aspart  0-20 Units Subcutaneous Q4H  . insulin aspart  7 Units Subcutaneous Q4H  . insulin detemir  32 Units Subcutaneous BID  . levalbuterol  0.63 mg Nebulization Q6H  . mouth rinse  15 mL Mouth Rinse 10 times per day  . pantoprazole sodium  40 mg Per Tube Daily  . polyethylene glycol  17 g Per Tube Daily  . rosuvastatin  10 mg Per Tube QHS   Continuous Infusions: .  prismasol BGK 4/2.5 500 mL/hr at 10/27/20 0410  .  prismasol BGK 4/2.5 300 mL/hr at 10/26/20 1807  . amiodarone 60 mg/hr (10/27/20 0700)  . dexmedetomidine (PRECEDEX) IV infusion Stopped (10/27/20 0657)  . feeding supplement (VITAL 1.5 CAL) 50 mL/hr at 10/27/20 0700  . fentaNYL infusion INTRAVENOUS Stopped (10/27/20 0657)  . heparin 1,500 Units/hr (10/27/20 0100)  . norepinephrine (LEVOPHED) Adult infusion 6 mcg/min (10/27/20 0700)  . phenylephrine (NEO-SYNEPHRINE) Adult infusion Stopped (10/21/20 1626)  . prismasol BGK 4/2.5 1,500 mL/hr at 10/27/20 0706   PRN Meds: acetaminophen (TYLENOL) oral liquid 160 mg/5 mL, fentaNYL, heparin, LORazepam, metoprolol tartrate, ondansetron (ZOFRAN) IV, sodium chloride, sodium chloride flush   Vital Signs    Vitals:   10/27/20 0630 10/27/20 0645 10/27/20 0700 10/27/20 0715  BP:   95/60   Pulse: (!) 113 95 94 (!) 105  Resp: (!) 21 (!) 26 (!) 26 (!) 21  Temp:       TempSrc:      SpO2: 100% 100% 100% 100%  Weight:      Height:        Intake/Output Summary (Last 24 hours) at 10/27/2020 0722 Last data filed at 10/27/2020 0700 Gross per 24 hour  Intake 3161.27 ml  Output 6879 ml  Net -3717.73 ml   Last 3 Weights 10/27/2020 10/26/2020 10/25/2020  Weight (lbs) 187 lb 6.3 oz 198 lb 13.7 oz 204 lb 9.4 oz  Weight (kg) 85 kg 90.2 kg 92.8 kg      Telemetry    Sinus - Personally Reviewed  ECG    No AM EKG - Personally Reviewed  Physical Exam   General: intubated, awake HEENT: ET tube in place SKIN: warm, dry.  Psychiatric: Unable to assess Neck: + JVD Lungs:Clear bilaterally, no wheezes, rhonci, crackles Cardiovascular: Regular rate and rhythm. No murmurs, gallops or rubs. Abdomen:Soft. Bowel sounds present. Non-tender.  Extremities: 1+ bilateral lower extremity edema  Labs    High Sensitivity Troponin:   Recent Labs  Lab 10/20/20 0121 10/20/20 0324 10/20/20 0608 10/23/20 2345 10/24/20 0210  TROPONINIHS 1,386* 4,619* 5,929* 4,207* 3,736*      Chemistry Recent Labs  Lab 10/22/20 0410 10/23/20 0507 10/26/20 0401 10/26/20 1529 10/27/20 0404  NA 133*   < > 134* 136 136  136  K 3.8   < > 4.7 4.6 4.7  4.7  CL 101   < > 99 100 99  99  CO2 19*   < > 21* '23 23  24  ' GLUCOSE 352*   < > 157* 85 85  86  BUN 52*   < > 42* 44* 45*  44*  CREATININE 2.48*   < > 2.07* 1.93* 1.84*  1.82*  CALCIUM 7.8*   < > 7.9* 8.4* 8.6*  8.6*  PROT 5.3*  --   --   --   --   ALBUMIN 2.0*   < > 2.5* 2.5* 2.5*  AST 259*  --   --   --   --   ALT 170*  --   --   --   --   ALKPHOS 174*  --   --   --   --   BILITOT 0.4  --   --   --   --   GFRNONAA 28*   < > 35* 38* 41*  41*  ANIONGAP 13   < > '14 13 14  13   ' < > = values in this interval not displayed.     Hematology Recent Labs  Lab 10/25/20 0347 10/26/20 0401 10/27/20 0404  WBC 16.0* 14.1* 16.0*  RBC 2.98* 2.87* 2.97*  HGB 7.7* 7.3* 7.5*  HCT 22.5* 22.0* 23.2*  MCV 75.5* 76.7* 78.1*   MCH 25.8* 25.4* 25.3*  MCHC 34.2 33.2 32.3  RDW 17.2* 17.5* 18.0*  PLT 311 257 328    BNP Recent Labs  Lab 10/22/20 0410  BNP 1,460.1*     DDimer No results for input(s): DDIMER in the last 168 hours.   Radiology    No results found.  Cardiac Studies   Echo 10/22/20:  1. Left ventricular ejection fraction, by estimation, is 30 to 35%. The  left ventricle has moderately decreased function. The left ventricle  demonstrates regional wall motion abnormalities (see scoring  diagram/findings for description). There is mild  left ventricular hypertrophy. Left ventricular diastolic parameters are  consistent with Grade III diastolic dysfunction (restrictive). Elevated  left atrial pressure.  2. Right ventricular systolic function is normal. The right ventricular  size is normal.  3. The mitral valve is normal in structure. Trivial mitral valve  regurgitation.  4. The aortic valve is calcified. There is moderate calcification of the  aortic valve. Aortic valve regurgitation is not visualized. Mild aortic  valve stenosis.   Patient Profile     64 y.o. male with history of severe multi-vessel CAD s/p CABG and multiple prior PCIs/stents, metastatic colon cancer with pulmonary and hepatic mets, ischemic cardiomyopathy admitted with chest pain and dyspnea and found to have pneumonia, elevated troponin, atrial fib with RVR. He has been treated with IV heparin and amiodarone drip. He has required pressor support.   Assessment & Plan    1. Atrial fib with RVR: He is in sinus today. He remains on IV amiodarone and IV  Heparin.    2. CAD s/p CABG/Elevated troponin: He has severe CAD. Troponin elevation in the setting of pneumonia and hypotension. LVEF is slightly worse. No plans for further invasive cardiac evaluation at this time. Will continue ASA, Plavix and statin.     3. Acute hypoxic respiratory failure with extensive multi-focal pulmonary infiltrates and pneumonia. Intubated  and being followed by the PCCM service.   4. Acute on chronic systolic CHF: Volume removal by CRRT  5. Acute renal failure: Nephrology  following  Goals are for volume removal and palliative approach.  Plan for home with Hospice when extubated.   For questions or updates, please contact [place] Please consult [email or web address] for contact info under     Signed, Lauree Chandler, MD  10/27/2020, 7:22 AM

## 2020-10-27 NOTE — Progress Notes (Addendum)
Ian [name] Progress Note   64 year old male  CAD s/p CABG 2008, STEMI 10/20 s/p DES x2 to L radial PDA with DES x2 to protected LM into LCx, STEMi 2/21 s/p DES to L radial PDA, DM, GERD, HTN, HLD, ICM, PAD s/p R fem-pop BPG 4/21, PAF, and metastatic colon cancer with hepatic and pulmonary metastasis with prior mets to liver s/p partial colectomy and chemotherapy p/w PNA, CHF consulted for CRRT.    Assessment/ Plan:   1.  AKI 2/2 cardiorenal syndrome vs ATN:  1. BL Cr around 1.0. Patient has had severe ischemic cardiomyopathy and mixed cardiogenic and distributive shock while admitted. Currently on norepinephrine drip for blood pressure. Cr worsening with attempted diuresis. Currently still +6568 mL during this hospitalization.  2. Not  candidate for long term dialysis however given his respiratory status and goals to attempt to spend his last remaining time with his family it appears reasonable to attempt CRRT for volume removal in an attempt to wean of the ventilator. 3. Appreciate CCM placing Left IJ temp on 12/6. 4. Had been better tolerating UF but then incr  Pressor requirements overnight but now down to 11mg of Levo.  Tolerating  around 20-40 ml / hr net UF at this time. 4/4/4 pre/dial/post. Even with CRRT may be difficult to get all the volume off with his soft pressures.  Continue CRRT for now with goal to stop in the next 24hr. Will attempt to increase to 50-100 as tolerated. 5. Seen by palliative and now DNR.   2. Metabolic acidosis: On CRRT.  3. Acute hypoxic respiratory failure multifactorial from pneumonia, pulmonary metastasis and volume overload: 1. Currently intubated. CXR on 12/5 showed right lower lobe consolidation, multiple nodular opacities through out.  2. Pulmonology following and attempting to wean off ventilator. 3. There is concern for pneumonia, checking sputum culture. Currently on vanc and cefepime.   4. NSTEMI: History of CAD s/p multiple DES, history  of acute on chronic systolic heart failure and cardiogenic shock: Continued on heparin and ASA, treating medically for now. Cardiology on board.  5. Afib with RVR: Currently on amiodarone drip, ASA, and digoxin. Remains tachycardic.  6. Metastatic colon cancer with mets to lung and liver: Per primary, f/u with oncology on discharge for possible palliative chemotherapy if he has improvement 7. Microcytic anemia: Hgb 7.5 today. BL around 10. Likely 2/2 AoCD.  8. Hyperglycemia: Per primary  I have seen and examined this patient and agree with the plan of care.   LDwana Melena MD 10/27/2020, 9:41 AM  Subjective:   Patient intubated, no new complaints. Denies fevers, chills, nausea, chest pain, or SOB.   Down to 672m Levo Objective:   BP 120/87   Pulse (!) 122   Temp 98.4 F (36.9 C) (Axillary)   Resp (!) 29   Ht '5\' 5"'  (1.651 m)   Wt 85 kg   SpO2 100%   BMI 31.18 kg/m   Intake/Output Summary (Last 24 hours) at 10/27/2020 0920 Last data filed at 10/27/2020 0900 Gross per 24 hour  Intake 2973.33 ml  Output 6512 ml  Net -3538.67 ml   Weight change: -5.2 kg  Physical Exam: General appearance: intubated, follows commands Head: NCAT Neck: +JVD, RIJ and LIJ in place Cardio: irregularly irregular rhythm GI: soft, non-tender; bowel sounds normal Extremities: 1+ BL LE swelling, sacral edema noted Pulses: 2+ and symmetric Neurologic: Follows commands  Imaging: DG Chest Port 1 View  Result Date: 10/27/2020 CLINICAL DATA:  Intubation.  Respiratory failure. EXAM: PORTABLE CHEST 1 VIEW COMPARISON:  10/25/2020. FINDINGS: Endotracheal tube, NG tube, right IJ line, left PowerPort catheter and stable position. Left IJ line in unchanged position with tip over right atrium. Prior CABG. Stable cardiomegaly. Diffuse bilateral pulmonary infiltrates/edema again noted. Bilateral pleural effusions again noted. No interim change. No pneumothorax. IMPRESSION: 1. Lines and tubes in unchanged position. 2.  Prior CABG. Stable cardiomegaly. 3. Diffuse bilateral pulmonary infiltrates/edema and bilateral pleural effusions again noted. No interim change. Electronically Signed   By: Marcello Moores  Register   On: 10/27/2020 07:48    Labs: BMET Recent Labs  Lab 10/23/20 1828 10/24/20 0210 10/24/20 1652 10/25/20 0027 10/25/20 0347 10/25/20 1715 10/26/20 0401 10/26/20 1529 10/27/20 0404  NA  --  134* 134* 137 134* 135 134* 136 136  136  K  --  3.6 3.8 3.6 3.8 4.2 4.7 4.6 4.7  4.7  CL  --  103 103  --  99 100 99 100 99  99  CO2  --  16* 16*  --  20* 21* 21* '23 23  24  ' GLUCOSE  --  200* 258*  --  286* 191* 157* 85 85  86  BUN  --  91* 87*  --  61* 44* 42* 44* 45*  44*  CREATININE  --  4.37* 4.14*  --  3.02* 2.29* 2.07* 1.93* 1.84*  1.82*  CALCIUM  --  7.7* 7.4*  --  7.9* 7.9* 7.9* 8.4* 8.6*  8.6*  PHOS 4.3  --  4.9*  --  3.5 3.3 3.3 3.2 3.7   CBC Recent Labs  Lab 10/24/20 0210 10/25/20 0027 10/25/20 0347 10/26/20 0401 10/27/20 0404  WBC 11.6*  --  16.0* 14.1* 16.0*  HGB 8.0* 7.5* 7.7* 7.3* 7.5*  HCT 24.2* 22.0* 22.5* 22.0* 23.2*  MCV 78.8*  --  75.5* 76.7* 78.1*  PLT 272  --  311 257 328    Medications:    . aspirin  81 mg Per Tube Daily  . B-complex with vitamin C  1 tablet Per Tube Daily  . chlorhexidine gluconate (MEDLINE KIT)  15 mL Mouth Rinse BID  . Chlorhexidine Gluconate Cloth  6 each Topical Daily  . clopidogrel  75 mg Per Tube Daily  . docusate  100 mg Per Tube BID  . feeding supplement (PROSource TF)  90 mL Per Tube TID  . fiber  1 packet Per Tube BID  . guaiFENesin  20 mL Per Tube Q8H  . insulin aspart  0-20 Units Subcutaneous Q4H  . levalbuterol  0.63 mg Nebulization Q6H  . mouth rinse  15 mL Mouth Rinse 10 times per day  . pantoprazole sodium  40 mg Per Tube Daily  . polyethylene glycol  17 g Per Tube Daily  . rosuvastatin  10 mg Per Tube QHS    Asencion Noble, M.D. PGY3 10/27/2020 9:20 AM

## 2020-10-27 NOTE — Progress Notes (Signed)
[place] for Heparin Indication: chest pain/ACS  Allergies  Allergen Reactions  . Anectine [Succinylcholine] Other (See Comments)    Atypical pseudocholinesterase deficiency  . Nsaids Other (See Comments)    Contraindicated (can only have asa 81 mg)    Patient Measurements: Height: 5\' 5"  (165.1 cm) Weight: 85 kg (187 lb 6.3 oz) IBW/kg (Calculated) : 61.5 Heparin Dosing Weight: 75.9 kg  Vital Signs: Temp: 98.4 F (36.9 C) (12/09 0356) Temp Source: Axillary (12/09 0356) BP: 120/87 (12/09 0800) Pulse Rate: 122 (12/09 0800)  Labs: Recent Labs    10/25/20 0347 10/25/20 1715 10/26/20 0401 10/26/20 1529 10/27/20 0404  HGB 7.7*  --  7.3*  --  7.5*  HCT 22.5*  --  22.0*  --  23.2*  PLT 311  --  257  --  328  HEPARINUNFRC 0.30  --  0.31  --  0.32  CREATININE 3.02*   < > 2.07* 1.93* 1.84*  1.82*   < > = values in this interval not displayed.    Estimated Creatinine Clearance: 41.2 mL/min (A) (by C-G formula based on SCr of 1.84 mg/dL (H)).  Assessment: 64 y.o. male admitted with chest pain and history of atrial fibrillation on Xarelto prior to admission. Xarelto is on hold and patient transitioned to IV Heparin. Currently following aPTT values due to effect of recent Xarelto on heparin levels.   Heparin level at low end of goal this morning (0.32. CBC is low but stable at Hgb 7.5, plt normal.   Goal of Therapy:  Heparin level 0.3-0.7 units/mL Monitor platelets by anticoagulation protocol: Yes   Plan:  Will continue heparin at 1500 units/hr  Daily heparin level, CBC  Hildred Laser, PharmD Clinical Pharmacist **Pharmacist phone directory can now be found on [email or web address] ([email or web address]).  Listed under [place].

## 2020-10-27 NOTE — Progress Notes (Addendum)
10/27/2020  I have seen and evaluated the patient for respiratory failure.  S:  Struggling a bit more this AM.  Increased secretions.    O: Blood pressure (!) 100/54, pulse 92, temperature 98.4 F (36.9 C), temperature source Axillary, resp. rate (!) 26, height 5\' 5"  (1.651 m), weight 85 kg, SpO2 100 %.  Ill appearing man on vent Ext with less edema Profoundly weak Thick purulent secretions from ETT Tachypneic, using accessory muscles  A:  -Acute hypoxemic respiratory failure due to volume overload, question HCAP, and underlying stage IV colon cancer with metastases to lung -Refractory baseline angina, underlying ischemic cardiomyopathy -Acute renal failure from cardiorenal syndrome and ATN -DM2  -End of life care  P:  - Continue to try to pull with CRRT - Increase fentanyl for WOB - Start HCAP coverage, check sputum cx - Check R chest with [place], ?loculated effusion - Continue vent support aiming for sats >90% - Goal is to try to get him home with hospice off vent, not sure we will be able to get there but will keep trying, appreciate continued palliative involvement   Patient critically ill due to respiratory failure Interventions to address this today vent titration Risk of deterioration without these interventions is high  I personally spent 35 minutes providing critical care not including any separately billable procedures  Ian Emery MD [facility] 10/27/2020 11:43 AM Personal pager: [phone] If unanswered, please page CCM On-call: [phone]       NAME:  Ian [name], MRN:  [id number], DOB:  Aug 31, 1956, LOS: 8 ADMISSION DATE:  10/19/2020, CONSULTATION DATE:  10/21/2020 REFERRING MD: Beau Fanny , CHIEF COMPLAINT:  Airway and vent management   Brief History   64 year old male with history of NSTEMI 09/30/2020, treated with conservative management with increasing nitrates and adding Ranexa.  11/10 he was diagnosed with multiple new hepatic  and pulmonary metastatic areas with mild anterior bladder wall thickening.  He was referred to oncology with plan to start chemo next week.  He had an ED visit 11/30, and returned 10/19/2020 due to chest pain is occurring multiple times per day, at rest and with exertion. He was unable to perform any task without angina. He was admitted by the cardiology service. CTA was done which was + for extensive multifocal pulmonary infiltrate and dense consolidation of the right middle lobe most in keeping with acute infection. Small associated right parapneumonic effusion.He was place on BiPAP 10/21/2020 for desats on HFNC.  PCCM have been consulted for high risk for  intubation and vent management.     Past Medical History    Past Medical History:  Diagnosis Date  . 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 Colon cancer ([place])   . Complication of anesthesia    atypical pseudo-cholinesterase deficiency  . Coronary artery disease 01/10/2013   s/p CABG 2008 // s/p STEMI in 08/2019>>DES x 2 to L radial-PDA; staged PCI: DES x 2 to protected LM into LCx // s/p inf-lat STEMI 12/2019 >> DES to L Radial-PDA  . Diabetes mellitus   . Family Hx of adverse reaction to anesthesia    sister also has atypical pseudo-cholinesterase deficiency  . Gastroesophageal reflux disease   . History of heart bypass surgery   . HLD (hyperlipidemia)   . Hypertension   . Ischemic cardiomyopathy    Echo 01/04/2020: EF 45-50, mild LVH, Gr 2 DD, normal RVSF, mild to mod LAE, mild MR, mild AS (mean gradient 6 mmHg)  . Mild aortic  stenosis   . Mild mitral regurgitation   . Myocardial infarction ([place])   . PAD (peripheral artery disease) ([facility])    s/p R fem-pop bypass 02/2020 (Dr. Donnetta Hutching)  . Paroxysmal atrial fibrillation ([facility])    CHADS-VASc=3 // noted post op after fem pop bypass in 02/2020; due to high risk, Apixaban started    Ian [name] Events   10/19/2020 Admission Recent admission 09/30/2020-10/01/2020 ED Visit  10/18/2020 12/6 still in shock, still volume overloaded.  Renal function and acid-base worse.  Nephrology consulted.  Starting CRRT. 12/7 minimal volume removed 12/8 looks more comfortable, still over 9 L positive, briefly tolerated pressure support ventilation, heart rate better controlled than it had been.  Overall looking better but not ready for extubation 12/9 work of breathing worse.  White blood cell count climbing, checking sputum culture.  Not able to wean Consults:  10/21/2020 PCCM  Procedures:  10/21/2020 Intubation 12/6 left IJ HD catheter placed Significant Diagnostic Tests:  CTA  10/20/2020 Interval development of extensive multifocal pulmonary infiltrate and dense consolidation of the right middle lobe most in keeping with acute infection. Small associated right parapneumonic effusion. No central obstructing lesion. Multiple bilateral pulmonary nodules are identified, but partially obscured, in keeping with pulmonary metastatic disease. Mild left ventricular dilation. No pulmonary embolism. Aortic Atherosclerosis (ICD10-I70.0).  Heart cath 01/04/20: 1. Severe multivessel coronary artery disease with chronic total occlusion of the LAD and RCA, continued patency of the left mainstem and left circumflex stents with focal mild to moderate in-stent restenosis at the transition of the left main into the circumflex. 2. Status post aortocoronary bypass surgery with continued patency of the LIMA to LAD graft and severe aorto ostial stenosis of the radial graft to PDA treated successfully with PCI using a 3.0 x 30 mm resolute Onyx DES with intravascular ultrasound guidance 3. Nonobstructive ostial left circumflex restenosis as demonstrated by intravascular ultrasound with a minimal lumen area of 5.96 mm  Recommendations: Long-term dual antiplatelet therapy with aspirin and Effient, continued aggressive medical therapy, 2D echocardiogram for assessment of LV systolic  function.   Echo 09/30/20: 1. Left ventricular ejection fraction, by estimation, is 40 to 45%. The  left ventricle has mildly decreased function. The left ventricle  demonstrates global hypokinesis. Left ventricular diastolic parameters are  consistent with Grade II diastolic  dysfunction (pseudonormalization).  2. Right ventricular systolic function is normal. The right ventricular  size is normal. There is normal pulmonary artery systolic pressure. The  estimated right ventricular systolic pressure is 97.0 mmHg.  3. Left atrial size was moderately dilated.  4. The mitral valve is normal in structure. Mild mitral valve  regurgitation. No evidence of mitral stenosis.  5. The aortic valve is tricuspid. Aortic valve regurgitation is not  visualized. Mild aortic valve stenosis. Aortic valve mean gradient  measures 11.0 mmHg.  6. The inferior vena cava is dilated in size with >50% respiratory  variability, suggesting right atrial pressure of 8 mmHg.  7. Technically difficult study with very poor images even with Definity.  For more accurate EF determination, would consider cardiac MR.   Micro Data:  10/20/2020 Blood Cx: pending 10/19/2020 Covid 19 Negative 10/19/2020 Influenza A&B Negative 12/9 respiratory culture>>> Antimicrobials:  Azithromycin 10/21/2020>> completed 5 days Rocephin 10/21/2020>> pleated 5 days  Interim history/subjective:  Work of breathing a little worse today even on full support.  Objective   Blood pressure 120/87, pulse (Abnormal) 122, temperature 98.4 F (36.9 C), temperature source Axillary, resp. rate (Abnormal) 29, height 5\' 5"  (  1.651 m), weight 85 kg, SpO2 100 %. CVP:  [8 mmHg-31 mmHg] 23 mmHg  Vent Mode: CPAP;PSV FiO2 (%):  [40 %] 40 % Set Rate:  [26 bmp] 26 bmp Vt Set:  [600 mL] 600 mL PEEP:  [5 cmH20] 5 cmH20 Pressure Support:  [15 cmH20] 15 cmH20 Plateau Pressure:  [21 cmH20-22 cmH20] 21 cmH20   Intake/Output Summary (Last 24 hours) at  10/27/2020 0850 Last data filed at 10/27/2020 0700 Gross per 24 hour  Intake 3040.5 ml  Output 6674 ml  Net -3633.5 ml   Filed Weights   10/25/20 0600 10/26/20 0600 10/27/20 0500  Weight: 92.8 kg 90.2 kg 85 kg    Examination:  General 64 year old white male remains on full ventilatory support HEENT normocephalic atraumatic orally intubated sclera nonicteric IV access sites unremarkable Pulmonary: Remains on full ventilatory support he is exhibiting increased accessory muscles today when compared to exam on yesterday has significant increase in tracheal and oral secretions today some yellow tinge Cardiac regular rate and rhythm Abdomen soft not tender Extremities dependent edema, extremities are warm today, pulses are palpable. GU clear yellow Neuro appropriate  Resolved [name] Problem list     Assessment & Plan:  Acute Respiratory Failure in setting of extensive multifocal pulmonary infiltrates and dense consolidation of the right middle lobe working diagnosis pneumonia (NOS), superimposed on pulmonary metastasis and complicated by volume overload  Portable chest x-ray from today on 12/9 shows endotracheal tube and support tubes and lines in satisfactory position.  There is chronic nodular airspace disease, also bilateral right greater than left airspace disease looks a little worse on the right today in spite of aggressive volume removal, his white blood cell count is climbing mixed with this and his increased tracheal secretions I worry about pneumonia Plan Continue volume removal  VAP bundle  PAD protocol  Full ventilator support, I do not think she will be able to wean today  Check sputum culture    Non-ST elevation MI in context of known CAD s/p CABG with severe disease in 3/4 grafts; complicated by Acute on Chronic systolic HF Atrial Fibrillation with RVR on amiodarone Cardiorenal and ATN leading to acute renal failure -> Serum creatinine improved, BUN improved,Still  over 9 L positive seems to be tolerating CRRT Plan Holding beta-blockade  Continue amiodarone and heparin drip  Volume removal via CRRT  Ensure normal potassium and magnesium   Anemia w/out evidence of bleeding Anemia/hemoglobin has remained stable no evidence of acute bleeding, hemoglobin has been trending in the mid 7 range Plan Trend CBC Transfuse for hemoglobin less than 7   Hyperglycemia/hypoglycemia We had been aggressively titrating his basal insulin dosing, he is now hypoglycemic Plan We will go ahead and hold the Levemir altogether Continue sliding scale insulin Hold basal insulin for now, will reassess later this afternoon   Metastatic Colon Cancer with new involvement Lung and Liver Seen by oncology, plan was for chemotherapy, this is no longer an appropriate expectation given end-stage cardiomyopathy   Best practice (evaluated daily)   Diet: TF Pain/Anxiety/Delirium protocol (if indicated): Precedex and Fentanyl VAP protocol (if indicated): initiated DVT prophylaxis: Heparin gtt GI prophylaxis: Protonix Glucose control: see above Mobility: BR last date of multidisciplinary goals of care discussion: Completed once again on 12/7; NP, MD, patient and his son Code Status: DNR.  Our hope is to extubate him, and ideally get him home with hospice do not know if this will be reasonable or not   disposition: ICU  My critical  care time is 33 minutes  Ian [name] ACNP-BC [place] Pager # [phone] OR # [phone] if no answer

## 2020-10-27 NOTE — Procedures (Addendum)
Insertion of Chest Tube Procedure Note  Ian [name]    [id number]  [birth year]  Date:10/27/20  Time:2:24 PM    Provider Performing: Cristal Generous   Procedure: Pleural Catheter Insertion w/ Imaging Guidance [phone])  Indication(s) Effusion  Consent Risks of the procedure as well as the alternatives and risks of each were explained to the patient and/or caregiver.  Consent for the procedure was obtained and is signed in the bedside chart  Anesthesia Topical 1% lidocaine and  IV fentanyl    Time Out Verified patient identification, verified procedure, site/side was marked, verified correct patient position, special equipment/implants available, medications/allergies/relevant history reviewed, required imaging and test results available.   Sterile Technique Maximal sterile technique including full sterile barrier drape, hand hygiene, sterile gown, sterile gloves, mask, hair covering, sterile ultrasound probe cover (if used).   Procedure Description Ultrasound used to identify appropriate pleural anatomy for placement and overlying skin marked. Area of placement cleaned and draped in sterile fashion.  A 14  French pigtail pleural catheter was placed into the left pleural space using Seldinger technique. Appropriate return of fluid was obtained.  The tube was connected to atrium and placed on -20 cm H2O wall suction.   Complications/Tolerance None; patient tolerated the procedure well. Chest X-ray obtained verifying placement of pigtail catheter.    EBL Minimal  Specimen(s) fluid   Eliseo Gum MSN, AGACNP-BC [place] [phone] If no answer, [phone] 10/27/2020, 2:26 PM

## 2020-10-27 NOTE — Procedures (Signed)
Insertion of Chest Tube Procedure Note  KOOPER GODSHALL  [id number]  May 24, 1956  Date:10/27/20  Time:2:29 PM    Provider Performing: Clementeen Graham   Procedure: Pleural Catheter Insertion w/ Imaging Guidance [phone])  Indication(s) Effusion  Consent Risks of the procedure as well as the alternatives and risks of each were explained to the patient and/or caregiver.  Consent for the procedure was obtained and is signed in the bedside chart  Anesthesia Topical only with 1% lidocaine    Time Out Verified patient identification, verified procedure, site/side was marked, verified correct patient position, special equipment/implants available, medications/allergies/relevant history reviewed, required imaging and test results available.   Sterile Technique Maximal sterile technique including full sterile barrier drape, hand hygiene, sterile gown, sterile gloves, mask, hair covering, sterile ultrasound probe cover (if used).   Procedure Description Ultrasound used to identify appropriate pleural anatomy for placement and overlying skin marked. Area of placement cleaned and draped in sterile fashion.  A 14 French pigtail pleural catheter was placed into the right pleural space using Seldinger technique. Appropriate return of fluid was obtained.  The tube was connected to atrium and placed on -20 cm H2O wall suction.   Complications/Tolerance None; patient tolerated the procedure well. Chest X-ray is ordered to verify placement.   EBL Minimal      Specimen(s) fluid   Erick Colace ACNP-BC [place] Pager # [phone] OR # [phone] if no answer

## 2020-10-28 ENCOUNTER — Inpatient Hospital Stay (HOSPITAL_COMMUNITY): Payer: Federal, State, Local not specified - PPO

## 2020-10-28 ENCOUNTER — Encounter (HOSPITAL_COMMUNITY): Payer: Federal, State, Local not specified - PPO

## 2020-10-28 LAB — RENAL FUNCTION PANEL
Albumin: 2.4 g/dL — ABNORMAL LOW (ref 3.5–5.0)
Albumin: 2.5 g/dL — ABNORMAL LOW (ref 3.5–5.0)
Anion gap: 15 (ref 5–15)
Anion gap: 14 (ref 5–15)
BUN: 45 mg/dL — ABNORMAL HIGH (ref 8–23)
BUN: 43 mg/dL — ABNORMAL HIGH (ref 8–23)
CO2: 21 mmol/L — ABNORMAL LOW (ref 22–32)
CO2: 22 mmol/L (ref 22–32)
Calcium: 8.4 mg/dL — ABNORMAL LOW (ref 8.9–10.3)
Calcium: 8.7 mg/dL — ABNORMAL LOW (ref 8.9–10.3)
Chloride: 98 mmol/L (ref 98–111)
Chloride: 99 mmol/L (ref 98–111)
Creatinine, Ser: 1.78 mg/dL — ABNORMAL HIGH (ref 0.61–1.24)
Creatinine, Ser: 1.64 mg/dL — ABNORMAL HIGH (ref 0.61–1.24)
GFR, Estimated: 42 mL/min — ABNORMAL LOW (ref >60)
GFR, Estimated: 47 mL/min — ABNORMAL LOW (ref >60)
Glucose, Bld: 269 mg/dL — ABNORMAL HIGH (ref 70–99)
Glucose, Bld: 212 mg/dL — ABNORMAL HIGH (ref 70–99)
Phosphorus: 3.2 mg/dL (ref 2.5–4.6)
Phosphorus: 3.1 mg/dL (ref 2.5–4.6)
Potassium: 4.4 mmol/L (ref 3.5–5.1)
Potassium: 4.1 mmol/L (ref 3.5–5.1)
Sodium: 134 mmol/L — ABNORMAL LOW (ref 135–145)
Sodium: 135 mmol/L (ref 135–145)

## 2020-10-28 LAB — HEPARIN LEVEL (UNFRACTIONATED)
Heparin Unfractionated: 0.28 IU/mL — ABNORMAL LOW (ref 0.30–0.70)
Heparin Unfractionated: 0.42 IU/mL (ref 0.30–0.70)

## 2020-10-28 LAB — CBC
HCT: 22.5 % — ABNORMAL LOW (ref 39.0–52.0)
Hemoglobin: 7.2 g/dL — ABNORMAL LOW (ref 13.0–17.0)
MCH: 25.1 pg — ABNORMAL LOW (ref 26.0–34.0)
MCHC: 32 g/dL (ref 30.0–36.0)
MCV: 78.4 fL — ABNORMAL LOW (ref 80.0–100.0)
Platelets: 306 10*3/uL (ref 150–400)
RBC: 2.87 MIL/uL — ABNORMAL LOW (ref 4.22–5.81)
RDW: 17.9 % — ABNORMAL HIGH (ref 11.5–15.5)
WBC: 18 10*3/uL — ABNORMAL HIGH (ref 4.0–10.5)
nRBC: 0.8 % — ABNORMAL HIGH (ref 0.0–0.2)

## 2020-10-28 LAB — GLUCOSE, CAPILLARY
Glucose-Capillary: 268 mg/dL — ABNORMAL HIGH (ref 70–99)
Glucose-Capillary: 132 mg/dL — ABNORMAL HIGH (ref 70–99)
Glucose-Capillary: 272 mg/dL — ABNORMAL HIGH (ref 70–99)
Glucose-Capillary: 208 mg/dL — ABNORMAL HIGH (ref 70–99)
Glucose-Capillary: 206 mg/dL — ABNORMAL HIGH (ref 70–99)

## 2020-10-28 LAB — BODY FLUID CELL COUNT WITH DIFFERENTIAL
Eos, Fluid: 0 %
Lymphs, Fluid: 71 %
Monocyte-Macrophage-Serous Fluid: 11 % — ABNORMAL LOW (ref 50–90)
Neutrophil Count, Fluid: 18 % (ref 0–25)
Total Nucleated Cell Count, Fluid: 503 cu mm (ref 0–1000)

## 2020-10-28 LAB — MAGNESIUM: Magnesium: 2.9 mg/dL — ABNORMAL HIGH (ref 1.7–2.4)

## 2020-10-28 MED ORDER — CHLORHEXIDINE GLUCONATE 0.12 % MT SOLN
OROMUCOSAL | Status: AC
  Administered 2020-10-28: 15 mL
  Filled 2020-10-28: qty 15

## 2020-10-28 MED ORDER — NUTRISOURCE FIBER PO PACK
1.0000 | PACK | Freq: Three times a day (TID) | ORAL | Status: DC
  Administered 2020-10-28 (×3): 1
  Filled 2020-10-28 (×6): qty 1

## 2020-10-28 MED ORDER — LEVALBUTEROL HCL 0.63 MG/3ML IN NEBU
0.6300 mg | INHALATION_SOLUTION | Freq: Three times a day (TID) | RESPIRATORY_TRACT | Status: DC
  Administered 2020-10-29 – 2020-11-01 (×10): 0.63 mg via RESPIRATORY_TRACT
  Filled 2020-10-28 (×10): qty 3

## 2020-10-28 NOTE — Progress Notes (Signed)
Daily Progress Note   Patient Name: Ian [name]       Date: 10/28/2020 DOB: 30-Oct-1956  Age: 64 y.o. MRN#: [id number] Attending Physician: Candee Furbish, MD Primary Care Physician: Sharilyn Sites, MD Admit Date: 10/19/2020  Reason for Consultation/Follow-up:  To discuss complex medical decision making related to patient's goals of care  Discussed with ICU RN.  Patient had two chest tubes (pig tail) place which drained a good deal of fluid from his pleural spaces.  Patient no longer on pressors.  Able to pull fluid with CRRT.   Weaned 45 minutes today.  Subjective: Patient intubated but awake and alert.  Appears fatigued.  Greets me.  We discussed the things that are going well.  Spoke with his wife outside of the room.  Explained that we are working hard to pull fluid off and get him home.   My worry is that the fluid will come back very quickly. Explained that if we get him home his time there is likely to be very short.  She is tearful and understands.  We decided to go ahead and connect the family with [facility] so that if we get a window to send him home - every thing will quickly be in place.   Assessment: Patient fatigued, intubated on CRRT.  Weaned 45 min. Cardio-renal syndrome.  Metastatic colorectal cancer with mets to the liver, and lung?  Nearing EOL.   Patient Profile/HPI:   Ian [name] is a 64 y.o. male with multiple medical problems including CAD status post CABG ischemic cardiomyopathy with EF of 40 to 45%, PAD status post right femoropopliteal bypass, paroxysmal AF, and stage IV colorectal cancer metastatic to liver (initially diagnosed (08/2018) status post neoadjuvant FOLFIRI chemotherapy and partial colectomy.  Patient was lost to oncology follow-up.  Patient was hospitalized 09/30/2020-10/01/2020 with unstable angina. CT of abdomen and pelvis (11/10) and CT of the chest (11/18) revealed multiple new liver and pulmonary metastases.  Patient was again seen by outpatient medical oncology and plan was to resume FOLFIRI and bevacizumab.  Patient is now readmitted 10/20/2020 with unstable angina and pneumonia.  He developed hypoxic respiratory failure requiring intubation.  Palliative care is consulted to address goals.   Length of Stay: 9   Vital Signs: BP 98/67 (BP Location: Right Arm)  Pulse 88   Temp 97.8 F (36.6 C) (Oral)   Resp (!) 26   Ht 5\' 5"  (1.651 m)   Wt 80.6 kg   SpO2 100%   BMI 29.57 kg/m  SpO2: SpO2: 100 % O2 Device: O2 Device: Ventilator O2 Flow Rate: O2 Flow Rate (L/min): 8 L/min       Palliative Assessment/Data: 20%     Palliative Care Plan    Recommendations/Plan:  Continue current care.  Consider leaving at least 1 pig tail chest tube in place for on-going drainage.  Will place TOC order for [facility] to contact the wife.  Want to have everything in place to get him home when/if we are able.  Code Status:  DNR  Prognosis:   Hours - Days once full support is discontinued.   Discharge Planning:  Home with Hospice vs hospital death.  Care plan was discussed with ICU RN and wife.  Thank you for allowing the Palliative Medicine Team to assist in the care of this patient.  Total time spent:  35 min.     Greater than 50%  of this time was spent counseling and coordinating care related to the above assessment and plan.  Florentina Jenny, PA-C Palliative Medicine  Please contact Palliative MedicineTeam phone at [phone] for questions and concerns between 7 am - 7 pm.   Please see AMION for individual provider pager numbers.

## 2020-10-28 NOTE — Progress Notes (Signed)
Pt refused CPT at this time due to pain from chest tubes.

## 2020-10-28 NOTE — Progress Notes (Signed)
ANTICOAGULATION CONSULT NOTE Pharmacy Consult for Heparin Indication: chest pain/ACS   Assessment: 64 y.o. male admitted with chest pain and history of atrial fibrillation on Xarelto prior to admission. Xarelto is on hold and patient transitioned to IV Heparin.   Heparin level at goal this morning (0.4. CBC is low but stable at Hgb 7.2, plt normal.   Goal of Therapy:  Heparin level 0.3-0.7 units/mL Monitor platelets by anticoagulation protocol: Yes   Plan:  Will continue heparin at 1600 units/hr  Daily heparin level, CBC  Thanks for allowing pharmacy to be a part of this patient's care.  Erin Hearing PharmD., BCPS Clinical Pharmacist 10/28/2020 7:30 AM

## 2020-10-28 NOTE — Progress Notes (Signed)
Progress Note  Patient Name: Ian [name] Date of Encounter: 10/28/2020  [facility] Cardiologist: Sherren Mocha, MD   Subjective   Intubated, awake  Inpatient Medications    Scheduled Meds: . aspirin  81 mg Per Tube Daily  . B-complex with vitamin C  1 tablet Per Tube Daily  . chlorhexidine gluconate (MEDLINE KIT)  15 mL Mouth Rinse BID  . Chlorhexidine Gluconate Cloth  6 each Topical Daily  . clopidogrel  75 mg Per Tube Daily  . docusate  100 mg Per Tube BID  . feeding supplement (PROSource TF)  90 mL Per Tube TID  . fiber  1 packet Per Tube BID  . guaiFENesin  20 mL Per Tube Q8H  . insulin aspart  0-20 Units Subcutaneous Q4H  . insulin detemir  12 Units Subcutaneous BID  . levalbuterol  0.63 mg Nebulization Q6H  . mouth rinse  15 mL Mouth Rinse 10 times per day  . pantoprazole sodium  40 mg Per Tube Daily  . polyethylene glycol  17 g Per Tube Daily  . rosuvastatin  10 mg Per Tube QHS  . sodium chloride flush  10 mL Intracatheter Q8H  . sodium chloride flush  10 mL Intracatheter Q8H   Continuous Infusions: .  prismasol BGK 4/2.5 500 mL/hr at 10/28/20 0050  .  prismasol BGK 4/2.5 300 mL/hr at 10/28/20 0407  . amiodarone 30 mg/hr (10/28/20 0703)  . ceFEPime (MAXIPIME) IV Stopped (10/27/20 2228)  . dexmedetomidine (PRECEDEX) IV infusion 0.4 mcg/kg/hr (10/28/20 0700)  . feeding supplement (VITAL 1.5 CAL) 50 mL/hr at 10/28/20 0700  . fentaNYL infusion INTRAVENOUS Stopped (10/28/20 0534)  . heparin 1,600 Units/hr (10/28/20 0700)  . norepinephrine (LEVOPHED) Adult infusion Stopped (10/28/20 0430)  . phenylephrine (NEO-SYNEPHRINE) Adult infusion Stopped (10/21/20 1626)  . prismasol BGK 4/2.5 1,500 mL/hr at 10/28/20 0701  . vancomycin     PRN Meds: acetaminophen (TYLENOL) oral liquid 160 mg/5 mL, fentaNYL, heparin, LORazepam, metoprolol tartrate, ondansetron (ZOFRAN) IV, sodium chloride, sodium chloride flush   Vital Signs    Vitals:   10/28/20 0630 10/28/20  0645 10/28/20 0700 10/28/20 0739  BP:   124/68   Pulse: 96 (!) 108 (!) 108   Resp: (!) 21 16 (!) 26   Temp:    98.1 F (36.7 C)  TempSrc:    Oral  SpO2: 100% 100% 100%   Weight:      Height:        Intake/Output Summary (Last 24 hours) at 10/28/2020 0743 Last data filed at 10/28/2020 0700 Gross per 24 hour  Intake 3260.5 ml  Output 7109 ml  Net -3848.5 ml   Last 3 Weights 10/28/2020 10/27/2020 10/26/2020  Weight (lbs) 177 lb 11.1 oz 187 lb 6.3 oz 198 lb 13.7 oz  Weight (kg) 80.6 kg 85 kg 90.2 kg      Telemetry    Sinus- Personally Reviewed  ECG    No AM EKG - Personally Reviewed  Physical Exam   General: Intubated, awake.  HEENT: ET tube in place SKIN: warm, dry. Psychiatric: awake  Neck: No JVD Lungs:Mechanical breath sounds bilaterally Cardiovascular: Regular rate and rhythm. No loud murmurs Abdomen:Soft. Bowel sounds present.  Extremities: No lower extremity edema.    Labs    High Sensitivity Troponin:   Recent Labs  Lab 10/20/20 0121 10/20/20 0324 10/20/20 0608 10/23/20 2345 10/24/20 0210  TROPONINIHS 1,386* 4,619* 5,929* 4,207* 3,736*      Chemistry Recent Labs  Lab 10/22/20 0410 10/23/20  0507 10/27/20 0404 10/27/20 1546 10/28/20 0433  NA 133*   < > 136  136 135 134*  K 3.8   < > 4.7  4.7 4.8 4.4  CL 101   < > 99  99 100 98  CO2 19*   < > 23  24 21* 21*  GLUCOSE 352*   < > 85  86 122* 269*  BUN 52*   < > 45*  44* 42* 45*  CREATININE 2.48*   < > 1.84*  1.82* 1.75* 1.78*  CALCIUM 7.8*   < > 8.6*  8.6* 8.2* 8.4*  PROT 5.3*  --   --   --   --   ALBUMIN 2.0*   < > 2.5* 2.5* 2.4*  AST 259*  --   --   --   --   ALT 170*  --   --   --   --   ALKPHOS 174*  --   --   --   --   BILITOT 0.4  --   --   --   --   GFRNONAA 28*   < > 41*  41* 43* 42*  ANIONGAP 13   < > _0 < > = values in this interval not displayed.     Hematology Recent Labs  Lab 10/26/20 0401 10/27/20 0404 10/28/20 0433  WBC 14.1* 16.0* 18.0*  RBC  2.87* 2.97* 2.87*  HGB 7.3* 7.5* 7.2*  HCT 22.0* 23.2* 22.5*  MCV 76.7* 78.1* 78.4*  MCH 25.4* 25.3* 25.1*  MCHC 33.2 32.3 32.0  RDW 17.5* 18.0* 17.9*  PLT 257 328 306    BNP Recent Labs  Lab 10/22/20 0410  BNP 1,460.1*     DDimer No results for input(s): DDIMER in the last 168 hours.   Radiology    DG CHEST PORT 1 VIEW  Result Date: 10/27/2020 CLINICAL DATA:  Bilateral chest tube placement. EXAM: PORTABLE CHEST 1 VIEW COMPARISON:  Chest x-ray from same day at 0611 hours. FINDINGS: Unchanged endotracheal and enteric tubes. Unchanged left chest wall port catheter and left internal jugular dialysis catheter. New right and left chest tubes with resolved bilateral pleural effusions and improved aeration at the lung bases. No definite pneumothorax, although the left lung apex is excluded from the field of view. Stable cardiomediastinal silhouette status post CABG. No acute osseous abnormality. IMPRESSION: 1. New bilateral chest tubes with resolved bilateral pleural effusions and improved aeration at the lung bases. Electronically Signed   By: Titus Dubin M.D.   On: 10/27/2020 14:50   DG Chest Port 1 View  Result Date: 10/27/2020 CLINICAL DATA:  Intubation.  Respiratory failure. EXAM: PORTABLE CHEST 1 VIEW COMPARISON:  10/25/2020. FINDINGS: Endotracheal tube, NG tube, right IJ line, left PowerPort catheter and stable position. Left IJ line in unchanged position with tip over right atrium. Prior CABG. Stable cardiomegaly. Diffuse bilateral pulmonary infiltrates/edema again noted. Bilateral pleural effusions again noted. No interim change. No pneumothorax. IMPRESSION: 1. Lines and tubes in unchanged position. 2. Prior CABG. Stable cardiomegaly. 3. Diffuse bilateral pulmonary infiltrates/edema and bilateral pleural effusions again noted. No interim change. Electronically Signed   By: Marcello Moores  Register   On: 10/27/2020 07:48    Cardiac Studies   Echo 10/22/20:  1. Left ventricular ejection  fraction, by estimation, is 30 to 35%. The  left ventricle has moderately decreased function. The left ventricle  demonstrates regional wall motion abnormalities (see scoring  diagram/findings for description). There is  mild  left ventricular hypertrophy. Left ventricular diastolic parameters are  consistent with Grade III diastolic dysfunction (restrictive). Elevated  left atrial pressure.  2. Right ventricular systolic function is normal. The right ventricular  size is normal.  3. The mitral valve is normal in structure. Trivial mitral valve  regurgitation.  4. The aortic valve is calcified. There is moderate calcification of the  aortic valve. Aortic valve regurgitation is not visualized. Mild aortic  valve stenosis.   Patient Profile     64 y.o. male with history of severe multi-vessel CAD s/p CABG and multiple prior PCIs/stents, metastatic colon cancer with pulmonary and hepatic mets, ischemic cardiomyopathy admitted with chest pain and dyspnea and found to have pneumonia, elevated troponin, atrial fib with RVR. He has been treated with IV heparin and amiodarone drip. He has required pressor support.   Assessment & Plan    1. Atrial fib with RVR: sinus today. I would continue IV amiodarone until he is extubated. We can convert to po amiodarone once extubated. Continue IV heparin for now. He was on Xarelto at home.   2. CAD s/p CABG/Elevated troponin: He has severe CAD. Troponin elevation in the setting of pneumonia and hypotension. LVEF is slightly worse. No plans for further invasive cardiac evaluation at this time.  -Continue ASA, Plavix and statin.     3. Acute hypoxic respiratory failure with extensive multi-focal pulmonary infiltrates and pneumonia. Intubated and being followed by the PCCM service. Weaning trials today.   4. Acute on chronic systolic CHF: Volume removal by CRRT  5. Acute renal failure: Nephrology following  Plan for home with Hospice when extubated.    For questions or updates, please contact [place] Please consult [email or web address] for contact info under     Signed, Lauree Chandler, MD  10/28/2020, 7:43 AM

## 2020-10-28 NOTE — Progress Notes (Signed)
NAME:  Ian [name], MRN:  [id number], DOB:  14-Feb-1956, LOS: 98 ADMISSION DATE:  10/19/2020, CONSULTATION DATE:  10/21/2020 REFERRING MD: Beau Fanny , CHIEF COMPLAINT:  Airway and vent management   Brief History   64 year old male with history of NSTEMI 09/30/2020, treated with conservative management with increasing nitrates and adding Ranexa.  11/10 he was diagnosed with multiple new hepatic and pulmonary metastatic areas with mild anterior bladder wall thickening.  He was referred to oncology with plan to start chemo next week.  He had an ED visit 11/30, and returned 10/19/2020 due to chest pain is occurring multiple times per day, at rest and with exertion. He was unable to perform any task without angina. He was admitted by the cardiology service. CTA was done which was + for extensive multifocal pulmonary infiltrate and dense consolidation of the right middle lobe most in keeping with acute infection. Small associated right parapneumonic effusion.He was place on BiPAP 10/21/2020 for desats on HFNC.  PCCM have been consulted for high risk for  intubation and vent management.     Past Medical History    Past Medical History:  Diagnosis Date  . 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 Colon cancer ([place])   . Complication of anesthesia    atypical pseudo-cholinesterase deficiency  . Coronary artery disease 01/10/2013   s/p CABG 2008 // s/p STEMI in 08/2019>>DES x 2 to L radial-PDA; staged PCI: DES x 2 to protected LM into LCx // s/p inf-lat STEMI 12/2019 >> DES to L Radial-PDA  . Diabetes mellitus   . Family Hx of adverse reaction to anesthesia    sister also has atypical pseudo-cholinesterase deficiency  . Gastroesophageal reflux disease   . History of heart bypass surgery   . HLD (hyperlipidemia)   . Hypertension   . Ischemic cardiomyopathy    Echo 01/04/2020: EF 45-50, mild LVH, Gr 2 DD, normal RVSF, mild to mod LAE, mild MR, mild AS (mean gradient 6 mmHg)  . Mild aortic stenosis   .  Mild mitral regurgitation   . Myocardial infarction ([place])   . PAD (peripheral artery disease) ([facility])    s/p R fem-pop bypass 02/2020 (Dr. Donnetta Hutching)  . Paroxysmal atrial fibrillation ([facility])    CHADS-VASc=3 // noted post op after fem pop bypass in 02/2020; due to high risk, Apixaban started    [facility] Events   10/19/2020 Admission Recent admission 09/30/2020-10/01/2020 ED Visit 10/18/2020 12/6 still in shock, still volume overloaded.  Renal function and acid-base worse.  Nephrology consulted.  Starting CRRT. 12/7 minimal volume removed 12/8 looks more comfortable, still over 9 L positive, briefly tolerated pressure support ventilation, heart rate better controlled than it had been.  Overall looking better but not ready for extubation 12/9 work of breathing worse.  White blood cell count climbing, checking sputum culture.  Not able to wean Consults:  10/21/2020 PCCM  Procedures:  10/21/2020 Intubation 12/6 left IJ HD catheter placed Significant Diagnostic Tests:  CTA  10/20/2020 Interval development of extensive multifocal pulmonary infiltrate and dense consolidation of the right middle lobe most in keeping with acute infection. Small associated right parapneumonic effusion. No central obstructing lesion. Multiple bilateral pulmonary nodules are identified, but partially obscured, in keeping with pulmonary metastatic disease. Mild left ventricular dilation. No pulmonary embolism. Aortic Atherosclerosis (ICD10-I70.0).  Heart cath 01/04/20: 1. Severe multivessel coronary artery disease with chronic total occlusion of the LAD and RCA, continued patency of the left mainstem and left circumflex stents with  focal mild to moderate in-stent restenosis at the transition of the left main into the circumflex. 2. Status post aortocoronary bypass surgery with continued patency of the LIMA to LAD graft and severe aorto ostial stenosis of the radial graft to PDA treated successfully with PCI  using a 3.0 x 30 mm resolute Onyx DES with intravascular ultrasound guidance 3. Nonobstructive ostial left circumflex restenosis as demonstrated by intravascular ultrasound with a minimal lumen area of 5.96 mm  Recommendations: Long-term dual antiplatelet therapy with aspirin and Effient, continued aggressive medical therapy, 2D echocardiogram for assessment of LV systolic function.   Echo 09/30/20: 1. Left ventricular ejection fraction, by estimation, is 40 to 45%. The  left ventricle has mildly decreased function. The left ventricle  demonstrates global hypokinesis. Left ventricular diastolic parameters are  consistent with Grade II diastolic  dysfunction (pseudonormalization).  2. Right ventricular systolic function is normal. The right ventricular  size is normal. There is normal pulmonary artery systolic pressure. The  estimated right ventricular systolic pressure is 49.6 mmHg.  3. Left atrial size was moderately dilated.  4. The mitral valve is normal in structure. Mild mitral valve  regurgitation. No evidence of mitral stenosis.  5. The aortic valve is tricuspid. Aortic valve regurgitation is not  visualized. Mild aortic valve stenosis. Aortic valve mean gradient  measures 11.0 mmHg.  6. The inferior vena cava is dilated in size with >50% respiratory  variability, suggesting right atrial pressure of 8 mmHg.  7. Technically difficult study with very poor images even with Definity.  For more accurate EF determination, would consider cardiac MR.   Micro Data:  10/20/2020 Blood Cx: pending 10/19/2020 Covid 19 Negative 10/19/2020 Influenza A&B Negative 12/9 respiratory culture>>> Antimicrobials:  Azithromycin 10/21/2020>> completed 5 days Rocephin 10/21/2020>> pleated 5 days  Interim history/subjective:  Tolerated pressure support for 45 minutes today before increased work of breathing.  Objective   Blood pressure 108/64, pulse (!) 103, temperature 98.1 F (36.7 C),  temperature source Oral, resp. rate 17, height 5\' 5"  (1.651 m), weight 80.6 kg, SpO2 100 %. CVP:  [4 mmHg-18 mmHg] 5 mmHg  Vent Mode: PRVC FiO2 (%):  [30 %-40 %] 40 % Set Rate:  [26 bmp] 26 bmp Vt Set:  [600 mL] 600 mL PEEP:  [5 cmH20] 5 cmH20 Pressure Support:  [15 cmH20] 15 cmH20 Plateau Pressure:  [18 cmH20-22 cmH20] 18 cmH20   Intake/Output Summary (Last 24 hours) at 10/28/2020 1039 Last data filed at 10/28/2020 1000 Gross per 24 hour  Intake 3192.31 ml  Output 7517 ml  Net -4324.69 ml   Filed Weights   10/26/20 0600 10/27/20 0500 10/28/20 0500  Weight: 90.2 kg 85 kg 80.6 kg    Examination:  General 64 year old white male remains on full ventilatory support HEENT normocephalic atraumatic orally intubated sclera nonicteric IV access sites unremarkable Pulmonary: Diffuse rhonchi copious secretion  Cardiac regular rate and rhythm Abdomen soft not tender Extremities trace edema, extremities are warm today, pulses are palpable. GU clear yellow Neuro appropriate, follows commands.  Diffusely weak  Resolved Hospital Problem list     Assessment & Plan:  Acute Respiratory Failure in setting of extensive multifocal pulmonary infiltrates and dense consolidation of the right middle lobe working diagnosis pneumonia (NOS), superimposed on pulmonary metastasis and complicated by volume overload  Non-ST elevation MI in context of known CAD s/p CABG with severe disease in 3/4 grafts; complicated by Acute on Chronic systolic HF Atrial Fibrillation with RVR on amiodarone Cardiorenal and ATN leading to  acute renal failure  Anemia w/out evidence of bleeding Hyperglycemia/hypoglycemia Metastatic Colon Cancer with new involvement Lung and Liver  Plan:  -Continue to attempt daily pressure support trials in view of attempted extubation.  Goal is to transition the patient to home hospice if possible.  Generalized weakness may be limiting factor at this time -Continue to remove fluid via  CRRT as tolerated to facilitate ventilator weaning -Gnosis is poor.  Palliative care discussions ongoing  Best practice (evaluated daily)   Diet: TF Pain/Anxiety/Delirium protocol (if indicated): Precedex and Fentanyl VAP protocol (if indicated): initiated DVT prophylaxis: Heparin gtt GI prophylaxis: Protonix Glucose control: see above Mobility: BR last date of multidisciplinary goals of care discussion: Completed once again on 12/7; NP, MD, patient and his son Code Status: DNR.  Our hope is to extubate him, and ideally get him home with hospice do not know if this will be reasonable or not   disposition: ICU  CRITICAL CARE Performed by: Kipp Brood   Total critical care time: 40 minutes  Critical care time was exclusive of separately billable procedures and treating other patients.  Critical care was necessary to treat or prevent imminent or life-threatening deterioration.  Critical care was time spent personally by me on the following activities: development of treatment plan with patient and/or surrogate as well as nursing, discussions with consultants, evaluation of patient's response to treatment, examination of patient, obtaining history from patient or surrogate, ordering and performing treatments and interventions, ordering and review of laboratory studies, ordering and review of radiographic studies, pulse oximetry, re-evaluation of patient's condition and participation in multidisciplinary rounds.  Kipp Brood, MD [facility] ICU Physician [place]  Pager: [phone] Mobile: [phone] After hours: [phone].

## 2020-10-28 NOTE — Progress Notes (Signed)
ANTICOAGULATION CONSULT NOTE Pharmacy Consult for Heparin Indication: chest pain/ACS   Assessment: 64 y.o. male admitted with chest pain and history of atrial fibrillation on Xarelto prior to admission. Xarelto is on hold and patient transitioned to IV Heparin. Currently following aPTT values due to effect of recent Xarelto on heparin levels.   Heparin level at low end of goal this morning (0.32. CBC is low but stable at Hgb 7.5, plt normal.   Goal of Therapy:  Heparin level 0.3-0.7 units/mL Monitor platelets by anticoagulation protocol: Yes   Plan:  Will increase heparin to 1600 units/hr  Daily heparin level, CBC  Thanks for allowing pharmacy to be a part of this patient's care.  Excell Seltzer, PharmD Clinical Pharmacist

## 2020-10-28 NOTE — Progress Notes (Signed)
[facility] Progress Note   64 year old male  CAD s/p CABG 2008, STEMI 10/20 s/p DES x2 to L radial PDA with DES x2 to protected LM into LCx, STEMi 2/21 s/p DES to L radial PDA, DM, GERD, HTN, HLD, ICM, PAD s/p R fem-pop BPG 4/21, PAF, and metastatic colon cancer with hepatic and pulmonary metastasis with prior mets to liver s/p partial colectomy and chemotherapy p/w PNA, CHF consulted for CRRT.    Assessment/ Plan:   1.  AKI 2/2 cardiorenal syndrome vs ATN:  1. BL Cr around 1.0. Patient has had severe ischemic cardiomyopathy and mixed cardiogenic and distributive shock while admitted. Currently on norepinephrine drip for blood pressure. Cr worsening with attempted diuresis. Currently still +6568 mL during this hospitalization.  2. Not  candidate for long term dialysis however given his respiratory status and goals to attempt to spend his last remaining time with his family it appears reasonable to attempt CRRT for volume removal in an attempt to wean of the ventilator. 3. Appreciate CCM placing Left IJ temp on 12/6. 4. CRRT to facilitate extubation to transition to home hospice if possible. Would continue CRRT until closer to extubation to ensure more volume removal 5. Seen by palliative and now DNR.   2. Metabolic acidosis: On CRRT.  3. Acute hypoxic respiratory failure multifactorial from pneumonia, pulmonary metastasis and volume overload: 1. Currently intubated. CXR on 12/5 showed right lower lobe consolidation, multiple nodular opacities through out.  2. Pulmonology following and attempting to wean off ventilator. 3. S/p chest tube placement 4. NSTEMI: History of CAD s/p multiple DES, history of acute on chronic systolic heart failure and cardiogenic shock: Continued on heparin and ASA, treating medically for now. Cardiology on board.  5. Afib with RVR: Currently on amiodarone drip, ASA, and digoxin. Remains tachycardic.  6. Metastatic colon cancer with mets to lung and  liver: Per primary, f/u with oncology on discharge for possible palliative chemotherapy if he has improvement but otherwise home hospice 7. Microcytic anemia: Hgb 7.2 today. BL around 10. Likely 2/2 AoCD.  8. Hyperglycemia: Per primary  [name], MD [facility]  Subjective:   Patient intubated, no new complaints. Denies fevers, chills, nausea, chest pain, or SOB.   Down to 80mg Levo Objective:   BP 98/67 (BP Location: Right Arm)   Pulse 88   Temp 97.8 F (36.6 C) (Oral)   Resp (!) 26   Ht '5\' 5"'  (1.651 m)   Wt 80.6 kg   SpO2 100%   BMI 29.57 kg/m   Intake/Output Summary (Last 24 hours) at 10/28/2020 1232 Last data filed at 10/28/2020 1200 Gross per 24 hour  Intake 3195.5 ml  Output 7644 ml  Net -4448.5 ml   Weight change: -4.4 kg  Physical Exam: General appearance: intubated, follows commands Head: NCAT Cardio: irregularly irregular rhythm Resp: +chest tubes, intubated, diminished air entry bibasilar GI: soft, non-tender; bowel sounds normal Extremities: 1+ BL LE swelling, sacral edema noted Pulses: 2+ and symmetric Neurologic: Follows commands  Imaging: DG Chest Port 1 View  Result Date: 10/28/2020 CLINICAL DATA:  Intubation.  Chest tube.  Respiratory failure. EXAM: PORTABLE CHEST 1 VIEW COMPARISON:  10/27/2020.  CT 10/20/2020. FINDINGS: Endotracheal tube, NG tube, bilateral central lines, bilateral chest tubes in stable position. No pneumothorax. Prior CABG. Stable cardiomegaly. Mild pulmonary venous congestion. Low lung volumes with bibasilar atelectasis. Nodular infiltrates. These are best identified by prior CT. No pleural effusion. IMPRESSION: 1. Lines and tubes including bilateral  chest tubes in stable position. No pneumothorax. 2. Prior CABG. Stable cardiomegaly. Mild pulmonary venous congestion. 3. Nodular infiltrates.  These are best identified by prior CT. Electronically Signed   By: Marcello Moores  Register   On: 10/28/2020 07:52   DG CHEST PORT 1  VIEW  Result Date: 10/27/2020 CLINICAL DATA:  Bilateral chest tube placement. EXAM: PORTABLE CHEST 1 VIEW COMPARISON:  Chest x-ray from same day at 0611 hours. FINDINGS: Unchanged endotracheal and enteric tubes. Unchanged left chest wall port catheter and left internal jugular dialysis catheter. New right and left chest tubes with resolved bilateral pleural effusions and improved aeration at the lung bases. No definite pneumothorax, although the left lung apex is excluded from the field of view. Stable cardiomediastinal silhouette status post CABG. No acute osseous abnormality. IMPRESSION: 1. New bilateral chest tubes with resolved bilateral pleural effusions and improved aeration at the lung bases. Electronically Signed   By: Titus Dubin M.D.   On: 10/27/2020 14:50   DG Chest Port 1 View  Result Date: 10/27/2020 CLINICAL DATA:  Intubation.  Respiratory failure. EXAM: PORTABLE CHEST 1 VIEW COMPARISON:  10/25/2020. FINDINGS: Endotracheal tube, NG tube, right IJ line, left PowerPort catheter and stable position. Left IJ line in unchanged position with tip over right atrium. Prior CABG. Stable cardiomegaly. Diffuse bilateral pulmonary infiltrates/edema again noted. Bilateral pleural effusions again noted. No interim change. No pneumothorax. IMPRESSION: 1. Lines and tubes in unchanged position. 2. Prior CABG. Stable cardiomegaly. 3. Diffuse bilateral pulmonary infiltrates/edema and bilateral pleural effusions again noted. No interim change. Electronically Signed   By: Marcello Moores  Register   On: 10/27/2020 07:48    Labs: BMET Recent Labs  Lab 10/25/20 0347 10/25/20 1715 10/26/20 0401 10/26/20 1529 10/27/20 0404 10/27/20 1546 10/28/20 0433  NA 134* 135 134* 136 136  136 135 134*  K 3.8 4.2 4.7 4.6 4.7  4.7 4.8 4.4  CL 99 100 99 100 99  99 100 98  CO2 20* 21* 21* '23 23  24 ' 21* 21*  GLUCOSE 286* 191* 157* 85 85  86 122* 269*  BUN 61* 44* 42* 44* 45*  44* 42* 45*  CREATININE 3.02* 2.29* 2.07*  1.93* 1.84*  1.82* 1.75* 1.78*  CALCIUM 7.9* 7.9* 7.9* 8.4* 8.6*  8.6* 8.2* 8.4*  PHOS 3.5 3.3 3.3 3.2 3.7 3.5 3.2   CBC Recent Labs  Lab 10/25/20 0347 10/26/20 0401 10/27/20 0404 10/28/20 0433  WBC 16.0* 14.1* 16.0* 18.0*  HGB 7.7* 7.3* 7.5* 7.2*  HCT 22.5* 22.0* 23.2* 22.5*  MCV 75.5* 76.7* 78.1* 78.4*  PLT 311 257 328 306    Medications:    . aspirin  81 mg Per Tube Daily  . B-complex with vitamin C  1 tablet Per Tube Daily  . chlorhexidine gluconate (MEDLINE KIT)  15 mL Mouth Rinse BID  . Chlorhexidine Gluconate Cloth  6 each Topical Daily  . clopidogrel  75 mg Per Tube Daily  . docusate  100 mg Per Tube BID  . feeding supplement (PROSource TF)  90 mL Per Tube TID  . fiber  1 packet Per Tube TID  . guaiFENesin  20 mL Per Tube Q8H  . insulin aspart  0-20 Units Subcutaneous Q4H  . insulin detemir  12 Units Subcutaneous BID  . levalbuterol  0.63 mg Nebulization Q6H  . mouth rinse  15 mL Mouth Rinse 10 times per day  . pantoprazole sodium  40 mg Per Tube Daily  . polyethylene glycol  17 g Per Tube Daily  .  rosuvastatin  10 mg Per Tube QHS  . sodium chloride flush  10 mL Intracatheter Q8H  . sodium chloride flush  10 mL Intracatheter Q8H

## 2020-10-29 DIAGNOSIS — Z9689 Presence of other specified functional implants: Secondary | ICD-10-CM

## 2020-10-29 DIAGNOSIS — I4891 Unspecified atrial fibrillation: Secondary | ICD-10-CM

## 2020-10-29 LAB — RENAL FUNCTION PANEL
Albumin: 2.6 g/dL — ABNORMAL LOW (ref 3.5–5.0)
Albumin: 2.8 g/dL — ABNORMAL LOW (ref 3.5–5.0)
Anion gap: 13 (ref 5–15)
Anion gap: 15 (ref 5–15)
BUN: 42 mg/dL — ABNORMAL HIGH (ref 8–23)
BUN: 37 mg/dL — ABNORMAL HIGH (ref 8–23)
CO2: 22 mmol/L (ref 22–32)
CO2: 22 mmol/L (ref 22–32)
Calcium: 8.9 mg/dL (ref 8.9–10.3)
Calcium: 9.3 mg/dL (ref 8.9–10.3)
Chloride: 98 mmol/L (ref 98–111)
Chloride: 97 mmol/L — ABNORMAL LOW (ref 98–111)
Creatinine, Ser: 1.52 mg/dL — ABNORMAL HIGH (ref 0.61–1.24)
Creatinine, Ser: 1.62 mg/dL — ABNORMAL HIGH (ref 0.61–1.24)
GFR, Estimated: 51 mL/min — ABNORMAL LOW (ref >60)
GFR, Estimated: 47 mL/min — ABNORMAL LOW (ref >60)
Glucose, Bld: 226 mg/dL — ABNORMAL HIGH (ref 70–99)
Glucose, Bld: 128 mg/dL — ABNORMAL HIGH (ref 70–99)
Phosphorus: 3.1 mg/dL (ref 2.5–4.6)
Phosphorus: 3.8 mg/dL (ref 2.5–4.6)
Potassium: 4.2 mmol/L (ref 3.5–5.1)
Potassium: 4.7 mmol/L (ref 3.5–5.1)
Sodium: 133 mmol/L — ABNORMAL LOW (ref 135–145)
Sodium: 134 mmol/L — ABNORMAL LOW (ref 135–145)

## 2020-10-29 LAB — GLUCOSE, CAPILLARY
Glucose-Capillary: 103 mg/dL — ABNORMAL HIGH (ref 70–99)
Glucose-Capillary: 133 mg/dL — ABNORMAL HIGH (ref 70–99)
Glucose-Capillary: 135 mg/dL — ABNORMAL HIGH (ref 70–99)
Glucose-Capillary: 160 mg/dL — ABNORMAL HIGH (ref 70–99)
Glucose-Capillary: 167 mg/dL — ABNORMAL HIGH (ref 70–99)
Glucose-Capillary: 222 mg/dL — ABNORMAL HIGH (ref 70–99)
Glucose-Capillary: 223 mg/dL — ABNORMAL HIGH (ref 70–99)

## 2020-10-29 LAB — HEPARIN LEVEL (UNFRACTIONATED): Heparin Unfractionated: 0.53 IU/mL (ref 0.30–0.70)

## 2020-10-29 LAB — CULTURE, RESPIRATORY W GRAM STAIN: Culture: NORMAL

## 2020-10-29 LAB — CBC
HCT: 25.1 % — ABNORMAL LOW (ref 39.0–52.0)
Hemoglobin: 8.3 g/dL — ABNORMAL LOW (ref 13.0–17.0)
MCH: 26.3 pg (ref 26.0–34.0)
MCHC: 33.1 g/dL (ref 30.0–36.0)
MCV: 79.4 fL — ABNORMAL LOW (ref 80.0–100.0)
Platelets: 354 10*3/uL (ref 150–400)
RBC: 3.16 MIL/uL — ABNORMAL LOW (ref 4.22–5.81)
RDW: 18.3 % — ABNORMAL HIGH (ref 11.5–15.5)
WBC: 18.5 10*3/uL — ABNORMAL HIGH (ref 4.0–10.5)
nRBC: 0.4 % — ABNORMAL HIGH (ref 0.0–0.2)

## 2020-10-29 LAB — MAGNESIUM: Magnesium: 2.9 mg/dL — ABNORMAL HIGH (ref 1.7–2.4)

## 2020-10-29 MED ORDER — CLOPIDOGREL BISULFATE 75 MG PO TABS
75.0000 mg | ORAL_TABLET | Freq: Every day | ORAL | Status: DC
  Administered 2020-10-30 – 2020-11-01 (×3): 75 mg via ORAL
  Filled 2020-10-29 (×3): qty 1

## 2020-10-29 MED ORDER — GUAIFENESIN 100 MG/5ML PO SOLN
20.0000 mL | Freq: Three times a day (TID) | ORAL | Status: DC
  Administered 2020-10-29 – 2020-11-01 (×8): 400 mg via ORAL
  Filled 2020-10-29: qty 20
  Filled 2020-10-29 (×2): qty 5
  Filled 2020-10-29: qty 20
  Filled 2020-10-29: qty 15
  Filled 2020-10-29: qty 20
  Filled 2020-10-29: qty 5
  Filled 2020-10-29 (×2): qty 20
  Filled 2020-10-29: qty 5

## 2020-10-29 MED ORDER — B COMPLEX-C PO TABS
1.0000 | ORAL_TABLET | Freq: Every day | ORAL | Status: DC
  Administered 2020-10-30 – 2020-11-01 (×3): 1 via ORAL
  Filled 2020-10-29 (×3): qty 1

## 2020-10-29 MED ORDER — PANTOPRAZOLE SODIUM 40 MG PO TBEC
40.0000 mg | DELAYED_RELEASE_TABLET | Freq: Every day | ORAL | Status: DC
  Administered 2020-10-30 – 2020-11-01 (×3): 40 mg via ORAL
  Filled 2020-10-29 (×3): qty 1

## 2020-10-29 MED ORDER — ROSUVASTATIN CALCIUM 5 MG PO TABS
10.0000 mg | ORAL_TABLET | Freq: Every day | ORAL | Status: DC
Start: 2020-10-29 — End: 2020-11-01
  Administered 2020-10-29 – 2020-10-31 (×3): 10 mg via ORAL
  Filled 2020-10-29 (×3): qty 2

## 2020-10-29 MED ORDER — DOCUSATE SODIUM 100 MG PO CAPS
100.0000 mg | ORAL_CAPSULE | Freq: Two times a day (BID) | ORAL | Status: DC
  Administered 2020-10-31 (×2): 100 mg via ORAL
  Filled 2020-10-29 (×4): qty 1

## 2020-10-29 MED ORDER — PANTOPRAZOLE SODIUM 40 MG IV SOLR
40.0000 mg | INTRAVENOUS | Status: AC
  Administered 2020-10-29: 12:00:00 40 mg via INTRAVENOUS
  Filled 2020-10-29: qty 40

## 2020-10-29 MED ORDER — ASPIRIN 81 MG PO CHEW
81.0000 mg | CHEWABLE_TABLET | Freq: Every day | ORAL | Status: DC
  Administered 2020-10-30 – 2020-11-01 (×3): 81 mg via ORAL
  Filled 2020-10-29 (×3): qty 1

## 2020-10-29 MED ORDER — POLYETHYLENE GLYCOL 3350 17 G PO PACK
17.0000 g | PACK | Freq: Every day | ORAL | Status: DC
  Administered 2020-10-31: 08:00:00 17 g via ORAL
  Filled 2020-10-29 (×2): qty 1

## 2020-10-29 MED ORDER — ACETAMINOPHEN 160 MG/5ML PO SOLN
650.0000 mg | ORAL | Status: DC | PRN
  Administered 2020-10-30 (×3): 650 mg via ORAL
  Filled 2020-10-29 (×3): qty 20.3

## 2020-10-29 NOTE — Progress Notes (Signed)
NAME:  Ian [name], MRN:  [id number], DOB:  July 07, 1956, LOS: 69 ADMISSION DATE:  10/19/2020, CONSULTATION DATE:  10/21/2020 REFERRING MD: Beau Fanny , CHIEF COMPLAINT:  Airway and vent management   Brief History   64 year old male with history of NSTEMI 09/30/2020, treated with conservative management with increasing nitrates and adding Ranexa.  11/10 he was diagnosed with multiple new hepatic and pulmonary metastatic areas with mild anterior bladder wall thickening.  He was referred to oncology with plan to start chemo next week.  He had an ED visit 11/30, and returned 10/19/2020 due to chest pain is occurring multiple times per day, at rest and with exertion. He was unable to perform any task without angina. He was admitted by the cardiology service. CTA was done which was + for extensive multifocal pulmonary infiltrate and dense consolidation of the right middle lobe most in keeping with acute infection. Small associated right parapneumonic effusion.He was place on BiPAP 10/21/2020 for desats on HFNC.  PCCM have been consulted for high risk for  intubation and vent management.     Past Medical History    Past Medical History:  Diagnosis Date  . 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 Colon cancer ([place])   . Complication of anesthesia    atypical pseudo-cholinesterase deficiency  . Coronary artery disease 01/10/2013   s/p CABG 2008 // s/p STEMI in 08/2019>>DES x 2 to L radial-PDA; staged PCI: DES x 2 to protected LM into LCx // s/p inf-lat STEMI 12/2019 >> DES to L Radial-PDA  . Diabetes mellitus   . Family Hx of adverse reaction to anesthesia    sister also has atypical pseudo-cholinesterase deficiency  . Gastroesophageal reflux disease   . History of heart bypass surgery   . HLD (hyperlipidemia)   . Hypertension   . Ischemic cardiomyopathy    Echo 01/04/2020: EF 45-50, mild LVH, Gr 2 DD, normal RVSF, mild to mod LAE, mild MR, mild AS (mean gradient 6 mmHg)  . Mild aortic stenosis    . Mild mitral regurgitation   . Myocardial infarction ([place])   . PAD (peripheral artery disease) ([facility])    s/p R fem-pop bypass 02/2020 (Dr. Donnetta Hutching)  . Paroxysmal atrial fibrillation ([facility])    CHADS-VASc=3 // noted post op after fem pop bypass in 02/2020; due to high risk, Apixaban started    [place] [facility] Events   10/19/2020 Admission Recent admission 09/30/2020-10/01/2020 ED Visit 10/18/2020 12/6 still in shock, still volume overloaded.  Renal function and acid-base worse.  Nephrology consulted.  Starting CRRT. 12/7 minimal volume removed 12/8 looks more comfortable, still over 9 L positive, briefly tolerated pressure support ventilation, heart rate better controlled than it had been.  Overall looking better but not ready for extubation 12/9 work of breathing worse.  White blood cell count climbing, checking sputum culture.  Not able to wean Consults:  10/21/2020 PCCM  Procedures:  10/21/2020 Intubation 12/6 left IJ HD catheter placed Significant Diagnostic Tests:  CTA  10/20/2020 Interval development of extensive multifocal pulmonary infiltrate and dense consolidation of the right middle lobe most in keeping with acute infection. Small associated right parapneumonic effusion. No central obstructing lesion. Multiple bilateral pulmonary nodules are identified, but partially obscured, in keeping with pulmonary metastatic disease. Mild left ventricular dilation. No pulmonary embolism. Aortic Atherosclerosis (ICD10-I70.0).  Heart cath 01/04/20: 1. Severe multivessel coronary artery disease with chronic total occlusion of the LAD and RCA, continued patency of the left mainstem and left circumflex stents with  focal mild to moderate in-stent restenosis at the transition of the left main into the circumflex. 2. Status post aortocoronary bypass surgery with continued patency of the LIMA to LAD graft and severe aorto ostial stenosis of the radial graft to PDA treated successfully with  PCI using a 3.0 x 30 mm resolute Onyx DES with intravascular ultrasound guidance 3. Nonobstructive ostial left circumflex restenosis as demonstrated by intravascular ultrasound with a minimal lumen area of 5.96 mm  Recommendations: Long-term dual antiplatelet therapy with aspirin and Effient, continued aggressive medical therapy, 2D echocardiogram for assessment of LV systolic function.   Echo 09/30/20: 1. Left ventricular ejection fraction, by estimation, is 40 to 45%. The  left ventricle has mildly decreased function. The left ventricle  demonstrates global hypokinesis. Left ventricular diastolic parameters are  consistent with Grade II diastolic  dysfunction (pseudonormalization).  2. Right ventricular systolic function is normal. The right ventricular  size is normal. There is normal pulmonary artery systolic pressure. The  estimated right ventricular systolic pressure is 37.6 mmHg.  3. Left atrial size was moderately dilated.  4. The mitral valve is normal in structure. Mild mitral valve  regurgitation. No evidence of mitral stenosis.  5. The aortic valve is tricuspid. Aortic valve regurgitation is not  visualized. Mild aortic valve stenosis. Aortic valve mean gradient  measures 11.0 mmHg.  6. The inferior vena cava is dilated in size with >50% respiratory  variability, suggesting right atrial pressure of 8 mmHg.  7. Technically difficult study with very poor images even with Definity.  For more accurate EF determination, would consider cardiac MR.   Micro Data:  10/20/2020 Blood Cx: pending 10/19/2020 Covid 19 Negative 10/19/2020 Influenza A&B Negative 12/9 respiratory culture>>> Antimicrobials:  Azithromycin 10/21/2020>> completed 5 days Rocephin 10/21/2020>> pleated 5 days  Interim history/subjective:  Comfortable today on 2h SBT  Objective   Blood pressure 110/74, pulse 88, temperature 97.8 F (36.6 C), resp. rate 18, height 5\' 5"  (1.651 m), weight 76.8 kg, SpO2  100 %. CVP:  [5 mmHg-16 mmHg] 8 mmHg  Vent Mode: Stand-by FiO2 (%):  [30 %] 30 % Set Rate:  [26 bmp] 26 bmp Vt Set:  [600 mL] 600 mL PEEP:  [5 cmH20] 5 cmH20 Pressure Support:  [10 cmH20] 10 cmH20 Plateau Pressure:  [19 cmH20-21 cmH20] 20 cmH20   Intake/Output Summary (Last 24 hours) at 10/29/2020 1209 Last data filed at 10/29/2020 1000 Gross per 24 hour  Intake 2607.08 ml  Output 5718 ml  Net -3110.92 ml   Filed Weights   10/27/20 0500 10/28/20 0500 10/29/20 0500  Weight: 85 kg 80.6 kg 76.8 kg    Examination:  General 64 year old white male remains on full ventilatory support HEENT normocephalic atraumatic orally intubated sclera nonicteric IV access sites unremarkable Pulmonary: Chest clear Cardiac regular rate and rhythm Abdomen soft not tender Extremities trace edema, extremities are warm today, pulses are palpable. GU clear yellow Neuro appropriate, follows commands.  Diffusely weak  Resolved Hospital Problem list     Assessment & Plan:  Acute Respiratory Failure in setting of extensive multifocal pulmonary infiltrates and dense consolidation of the right middle lobe working diagnosis pneumonia (NOS), superimposed on pulmonary metastasis and complicated by volume overload  Non-ST elevation MI in context of known CAD s/p CABG with severe disease in 3/4 grafts; complicated by Acute on Chronic systolic HF Atrial Fibrillation with RVR on amiodarone Cardiorenal and ATN leading to acute renal failure  Anemia w/out evidence of bleeding Hyperglycemia/hypoglycemia Metastatic Colon Cancer with new  involvement Lung and Liver  Plan:  -Extubate today -Continue to remove fluid via CRRT as tolerated.  -Prognosis is poor.  Palliative care discussions ongoing. Goal is to optimize prior to discharge to home hospice  - Continue amiodarone for AF.  Best practice (evaluated daily)   Diet: TF Pain/Anxiety/Delirium protocol (if indicated): Precedex and Fentanyl stopped post  extubation VAP protocol (if indicated): initiated DVT prophylaxis: Heparin gtt GI prophylaxis: Protonix Glucose control: see above Mobility: BR last date of multidisciplinary goals of care discussion: Completed once again on 12/7; NP, MD, patient and his son Code Status: DNR.   disposition: ICU  CRITICAL CARE Performed by: Kipp Brood   Total critical care time: 40 minutes  Critical care time was exclusive of separately billable procedures and treating other patients.  Critical care was necessary to treat or prevent imminent or life-threatening deterioration.  Critical care was time spent personally by me on the following activities: development of treatment plan with patient and/or surrogate as well as nursing, discussions with consultants, evaluation of patient's response to treatment, examination of patient, obtaining history from patient or surrogate, ordering and performing treatments and interventions, ordering and review of laboratory studies, ordering and review of radiographic studies, pulse oximetry, re-evaluation of patient's condition and participation in multidisciplinary rounds.  Kipp Brood, MD [facility] ICU Physician [place]  Pager: [phone] Mobile: [phone] After hours: [phone].

## 2020-10-29 NOTE — Progress Notes (Signed)
Daily Progress Note   Patient Name: Ian [name]       Date: 10/29/2020 DOB: September 13, 1956  Age: 64 y.o. MRN#: [id number] Attending Physician: Kipp Brood, MD Primary Care Physician: Sharilyn Sites, MD Admit Date: 10/19/2020  Reason for Consultation/Follow-up: Hospice referral, symptom check To discuss complex medical decision making related to patient's goals of care  Subjective: Patient is extubated and greets me when I come to bedside.  He tells me his wife is nearly perfect.  I concur that he has a wonderful family.  I've enjoyed getting to know them.   He asked about when he would be discharged.  I deferred to his attending physician, but mentioned the next step would be coming off of CRRT after enough fluid was removed.  Patient complains of nausea.  Talked with wife.  Hospice has not called her yet.  Committed to touch base with TOC and Hospice to make certain things were arranged prior to DC.   Assessment: Patient very fatigued and weak.  Cardiorenal syndrome.  Now extubated.  Remains on CRRT.   Patient Profile/HPI:  This is a 64 year old male with a history of CAD s/p CABG 2008, STEMI 10/20 s/p DES x2 to L radial PDA with DES x2 to protected LM into LCx, STEMi 2/21 s/p DES to L radial PDA, DM, GERD, HTN, HLD, ICM, PAD s/p R fem-pop BPG 4/21, PAF, and metastatic colon cancer with hepatic and pulmonary metastasis with prior mets to liver s/p partial colectomy and chemotherapy who initially presented with chest pain and admitted to cardiology service, CTA showed extensive multifocal pulmonary infiltrates and dense consolidation of right middle lobe and small associated right parapneumonic effusion. Placed on BiPAP and PCCM was consulted for high risk of intubation and vent management.  Patient was failing BiPAP and he was intubated. Started on azithromycin and rocephin for pneumonia, unclear if CAP vs aspiration vs postobstructive process. Patient has been requiring higher amounts of PEEP and oxygen requirements, there was concern for volume overload worsening his respiratory status. Cr and BUN continue to rise, BL cr around 1.0, up to 4.37 today. He has had low amounts of urinary output, has been 11L net positive since admission. Nephrology consulted for CRRT.    Length of Stay: 10   Vital Signs: BP 110/74   Pulse 92  Temp (!) 96.7 F (35.9 C) (Axillary)   Resp (!) 23   Ht 5\' 5"  (1.651 m)   Wt 76.8 kg   SpO2 100%   BMI 28.18 kg/m  SpO2: SpO2: 100 % O2 Device: O2 Device: Nasal Cannula O2 Flow Rate: O2 Flow Rate (L/min): 2 L/min       Palliative Assessment/Data: 20%     Palliative Care Plan    Recommendations/Plan:  Continue current care.  Chatted with TOC regarding Hospice referral and they are reaching out to [facility].  PMT will continue to follow with you.  Code Status:  DNR  Prognosis:  Likely days once CRRT is stopped and he is able to DC home.  (Hopefully longer)  Discharge Planning:  Home with Hospice  Care plan was discussed with patient, wife, TOC, ICU RN  Thank you for allowing the Palliative Medicine Team to assist in the care of this patient.  Total time spent:  25 min.     Greater than 50%  of this time was spent counseling and coordinating care related to the above assessment and plan.  Florentina Jenny, PA-C Palliative Medicine  Please contact Palliative MedicineTeam phone at [phone] for questions and concerns between 7 am - 7 pm.   Please see AMION for individual provider pager numbers.

## 2020-10-29 NOTE — Progress Notes (Signed)
[facility] Progress Note   64 year old male  CAD s/p CABG 2008, STEMI 10/20 s/p DES x2 to L radial PDA with DES x2 to protected LM into LCx, STEMi 2/21 s/p DES to L radial PDA, DM, GERD, HTN, HLD, ICM, PAD s/p R fem-pop BPG 4/21, PAF, and metastatic colon cancer with hepatic and pulmonary metastasis with prior mets to liver s/p partial colectomy and chemotherapy p/w PNA, CHF consulted for CRRT.    Assessment/ Plan:   1.  AKI 2/2 cardiorenal syndrome vs ATN:  1. BL Cr around 1.0. Patient has had severe ischemic cardiomyopathy and mixed cardiogenic and distributive shock while admitted. Currently on norepinephrine drip for blood pressure. Cr worsening with attempted diuresis. Currently still +6568 mL during this hospitalization.  2. Not  candidate for long term dialysis however given his respiratory status and goals to attempt to spend his last remaining time with his family it appears reasonable to attempt CRRT for volume removal in an attempt to wean of the ventilator. 3. Appreciate CCM placing Left IJ temp on 12/6. 4. CRRT to facilitate extubation to transition to home hospice if possible. Would continue CRRT until extubation to ensure more volume removal. Aiming for net neg 100-150cc/hr 5. Seen by palliative and now DNR.   2. Metabolic acidosis: On CRRT.  3. Acute hypoxic respiratory failure multifactorial from pneumonia, pulmonary metastasis and volume overload: 1. Currently intubated. CXR on 12/5 showed right lower lobe consolidation, multiple nodular opacities through out.  2. Pulmonology following and attempting to wean off ventilator. 3. S/p chest tube placement 4. NSTEMI: History of CAD s/p multiple DES, history of acute on chronic systolic heart failure and cardiogenic shock: Continued on heparin and ASA, treating medically for now. Cardiology on board.  5. Afib with RVR: Currently on amiodarone drip, ASA, and digoxin. Remains tachycardic.  6. Metastatic colon cancer  with mets to lung and liver: Per primary, f/u with oncology on discharge for possible palliative chemotherapy if he has improvement but otherwise home hospice 7. Microcytic anemia: Hgb 7.2 today. BL around 10. Likely 2/2 AoCD.  8. Hyperglycemia: Per primary  Ian Quint, MD [facility]  Subjective:   Patient intubated, no new complaints. Tolerating UF. fio2 30%. VSS   Objective:   BP 110/74   Pulse 94   Temp (!) 97.2 F (36.2 C) (Axillary)   Resp (!) 21   Ht _0  (1.651 m)   Wt 76.8 kg   SpO2 100%   BMI 28.18 kg/m   Intake/Output Summary (Last 24 hours) at 10/29/2020 0953 Last data filed at 10/29/2020 0900 Gross per 24 hour  Intake 3164.41 ml  Output 5931 ml  Net -2766.59 ml   Weight change: -3.8 kg  Physical Exam: General appearance: intubated, follows commands Head: NCAT Cardio: irregularly irregular rhythm Resp: +chest tubes, intubated, diminished air entry bibasilar GI: soft, non-tender; bowel sounds normal Extremities: 1+ BL LE swelling, sacral edema noted Pulses: 2+ and symmetric Neurologic: Follows commands  Imaging: DG Chest Port 1 View  Result Date: 10/28/2020 CLINICAL DATA:  Intubation.  Chest tube.  Respiratory failure. EXAM: PORTABLE CHEST 1 VIEW COMPARISON:  10/27/2020.  CT 10/20/2020. FINDINGS: Endotracheal tube, NG tube, bilateral central lines, bilateral chest tubes in stable position. No pneumothorax. Prior CABG. Stable cardiomegaly. Mild pulmonary venous congestion. Low lung volumes with bibasilar atelectasis. Nodular infiltrates. These are best identified by prior CT. No pleural effusion. IMPRESSION: 1. Lines and tubes including bilateral chest tubes in stable position. No pneumothorax.  2. Prior CABG. Stable cardiomegaly. Mild pulmonary venous congestion. 3. Nodular infiltrates.  These are best identified by prior CT. Electronically Signed   By: Marcello Moores  Register   On: 10/28/2020 07:52   DG CHEST PORT 1 VIEW  Result Date:  10/27/2020 CLINICAL DATA:  Bilateral chest tube placement. EXAM: PORTABLE CHEST 1 VIEW COMPARISON:  Chest x-ray from same day at 0611 hours. FINDINGS: Unchanged endotracheal and enteric tubes. Unchanged left chest wall port catheter and left internal jugular dialysis catheter. New right and left chest tubes with resolved bilateral pleural effusions and improved aeration at the lung bases. No definite pneumothorax, although the left lung apex is excluded from the field of view. Stable cardiomediastinal silhouette status post CABG. No acute osseous abnormality. IMPRESSION: 1. New bilateral chest tubes with resolved bilateral pleural effusions and improved aeration at the lung bases. Electronically Signed   By: Titus Dubin M.D.   On: 10/27/2020 14:50    Labs: BMET Recent Labs  Lab 10/26/20 0401 10/26/20 1529 10/27/20 0404 10/27/20 1546 10/28/20 0433 10/28/20 1501 10/29/20 0451  NA 134* 136 136  136 135 134* 135 133*  K 4.7 4.6 4.7  4.7 4.8 4.4 4.1 4.2  CL 99 100 99  99 100 98 99 98  CO2 21* _0 21* 21* 22 22  GLUCOSE 157* 85 85  86 122* 269* 212* 226*  BUN 42* 44* 45*  44* 42* 45* 43* 42*  CREATININE 2.07* 1.93* 1.84*  1.82* 1.75* 1.78* 1.64* 1.52*  CALCIUM 7.9* 8.4* 8.6*  8.6* 8.2* 8.4* 8.7* 8.9  PHOS 3.3 3.2 3.7 3.5 3.2 3.1 3.1   CBC Recent Labs  Lab 10/26/20 0401 10/27/20 0404 10/28/20 0433 10/29/20 0451  WBC 14.1* 16.0* 18.0* 18.5*  HGB 7.3* 7.5* 7.2* 8.3*  HCT 22.0* 23.2* 22.5* 25.1*  MCV 76.7* 78.1* 78.4* 79.4*  PLT 257 328 306 354    Medications:    . aspirin  81 mg Per Tube Daily  . B-complex with vitamin C  1 tablet Per Tube Daily  . chlorhexidine gluconate (MEDLINE KIT)  15 mL Mouth Rinse BID  . Chlorhexidine Gluconate Cloth  6 each Topical Daily  . clopidogrel  75 mg Per Tube Daily  . docusate  100 mg Per Tube BID  . feeding supplement (PROSource TF)  90 mL Per Tube TID  . fiber  1 packet Per Tube TID  . guaiFENesin  20 mL Per Tube Q8H  .  insulin aspart  0-20 Units Subcutaneous Q4H  . insulin detemir  12 Units Subcutaneous BID  . levalbuterol  0.63 mg Nebulization TID  . mouth rinse  15 mL Mouth Rinse 10 times per day  . pantoprazole sodium  40 mg Per Tube Daily  . polyethylene glycol  17 g Per Tube Daily  . rosuvastatin  10 mg Per Tube QHS  . sodium chloride flush  10 mL Intracatheter Q8H  . sodium chloride flush  10 mL Intracatheter Q8H

## 2020-10-29 NOTE — Procedures (Signed)
Extubation Procedure Note  Patient Details:   Name: Ian [name] DOB: 03/15/1956 MRN: [id number]   Airway Documentation:    Vent end date: 10/29/20 Vent end time: 1010   Evaluation  O2 sats: stable throughout Complications: No apparent complications Patient did tolerate procedure well. Bilateral Breath Sounds: Clear,Diminished   Yes  Roneshia Drew 10/29/2020, 10:12 AM

## 2020-10-29 NOTE — Progress Notes (Signed)
Progress Note  Patient Name: Ian [name] Date of Encounter: 10/29/2020  [facility] Cardiologist: Sherren Mocha, MD   Subjective   Intubated, awake, responded to commands,  ready to be extubated, wife bedside.  Inpatient Medications    Scheduled Meds: . aspirin  81 mg Per Tube Daily  . B-complex with vitamin C  1 tablet Per Tube Daily  . chlorhexidine gluconate (MEDLINE KIT)  15 mL Mouth Rinse BID  . Chlorhexidine Gluconate Cloth  6 each Topical Daily  . clopidogrel  75 mg Per Tube Daily  . docusate  100 mg Per Tube BID  . feeding supplement (PROSource TF)  90 mL Per Tube TID  . fiber  1 packet Per Tube TID  . guaiFENesin  20 mL Per Tube Q8H  . insulin aspart  0-20 Units Subcutaneous Q4H  . insulin detemir  12 Units Subcutaneous BID  . levalbuterol  0.63 mg Nebulization TID  . mouth rinse  15 mL Mouth Rinse 10 times per day  . pantoprazole sodium  40 mg Per Tube Daily  . polyethylene glycol  17 g Per Tube Daily  . rosuvastatin  10 mg Per Tube QHS  . sodium chloride flush  10 mL Intracatheter Q8H  . sodium chloride flush  10 mL Intracatheter Q8H   Continuous Infusions: .  prismasol BGK 4/2.5 500 mL/hr at 10/29/20 0644  .  prismasol BGK 4/2.5 300 mL/hr at 10/28/20 2028  . amiodarone 30 mg/hr (10/29/20 0900)  . ceFEPime (MAXIPIME) IV 2 g (10/28/20 2219)  . dexmedetomidine (PRECEDEX) IV infusion 0.5 mcg/kg/hr (10/29/20 0900)  . feeding supplement (VITAL 1.5 CAL) Stopped (10/29/20 0903)  . fentaNYL infusion INTRAVENOUS 100 mcg/hr (10/29/20 0900)  . heparin 1,600 Units/hr (10/29/20 0900)  . norepinephrine (LEVOPHED) Adult infusion Stopped (10/28/20 0430)  . phenylephrine (NEO-SYNEPHRINE) Adult infusion Stopped (10/21/20 1626)  . prismasol BGK 4/2.5 1,500 mL/hr at 10/29/20 0644  . vancomycin Stopped (10/28/20 1128)   PRN Meds: acetaminophen (TYLENOL) oral liquid 160 mg/5 mL, fentaNYL, heparin, LORazepam, metoprolol tartrate, ondansetron (ZOFRAN) IV, sodium  chloride, sodium chloride flush   Vital Signs    Vitals:   10/29/20 0700 10/29/20 0726 10/29/20 0800 10/29/20 0900  BP:      Pulse: 81  87 94  Resp: (!) 26  15 (!) 21  Temp:  (!) 96.9 F (36.1 C) (!) 97.2 F (36.2 C)   TempSrc:  Axillary Axillary   SpO2: 100%  100% 100%  Weight:      Height:        Intake/Output Summary (Last 24 hours) at 10/29/2020 0922 Last data filed at 10/29/2020 0900 Gross per 24 hour  Intake 3164.41 ml  Output 5931 ml  Net -2766.59 ml   Last 3 Weights 10/29/2020 10/28/2020 10/27/2020  Weight (lbs) 169 lb 5 oz 177 lb 11.1 oz 187 lb 6.3 oz  Weight (kg) 76.8 kg 80.6 kg 85 kg      Telemetry    Sinus- Personally Reviewed  ECG    No AM EKG - Personally Reviewed  Physical Exam   General: Intubated, awake.  HEENT: ET tube in place SKIN: warm, dry. Psychiatric: awake  Neck: No JVD Lungs:Mechanical breath sounds bilaterally Cardiovascular: Regular rate and rhythm. No loud murmurs Abdomen:Soft. Bowel sounds present.  Extremities: No lower extremity edema.   Labs    High Sensitivity Troponin:   Recent Labs  Lab 10/20/20 0121 10/20/20 0324 10/20/20 0608 10/23/20 2345 10/24/20 0210  TROPONINIHS 1,386* 4,619* 5,929* 4,207* 3,736*  Chemistry Recent Labs  Lab 10/28/20 0433 10/28/20 1501 10/29/20 0451  NA 134* 135 133*  K 4.4 4.1 4.2  CL 98 99 98  CO2 21* 22 22  GLUCOSE 269* 212* 226*  BUN 45* 43* 42*  CREATININE 1.78* 1.64* 1.52*  CALCIUM 8.4* 8.7* 8.9  ALBUMIN 2.4* 2.5* 2.6*  GFRNONAA 42* 47* 51*  ANIONGAP _0 Hematology Recent Labs  Lab 10/27/20 0404 10/28/20 0433 10/29/20 0451  WBC 16.0* 18.0* 18.5*  RBC 2.97* 2.87* 3.16*  HGB 7.5* 7.2* 8.3*  HCT 23.2* 22.5* 25.1*  MCV 78.1* 78.4* 79.4*  MCH 25.3* 25.1* 26.3  MCHC 32.3 32.0 33.1  RDW 18.0* 17.9* 18.3*  PLT 328 306 354    BNP No results for input(s): BNP, PROBNP in the last 168 hours.   DDimer No results for input(s): DDIMER in the last 168  hours.   Radiology    DG Chest Port 1 View  Result Date: 10/28/2020 CLINICAL DATA:  Intubation.  Chest tube.  Respiratory failure. EXAM: PORTABLE CHEST 1 VIEW COMPARISON:  10/27/2020.  CT 10/20/2020. FINDINGS: Endotracheal tube, NG tube, bilateral central lines, bilateral chest tubes in stable position. No pneumothorax. Prior CABG. Stable cardiomegaly. Mild pulmonary venous congestion. Low lung volumes with bibasilar atelectasis. Nodular infiltrates. These are best identified by prior CT. No pleural effusion. IMPRESSION: 1. Lines and tubes including bilateral chest tubes in stable position. No pneumothorax. 2. Prior CABG. Stable cardiomegaly. Mild pulmonary venous congestion. 3. Nodular infiltrates.  These are best identified by prior CT. Electronically Signed   By: Marcello Moores  Register   On: 10/28/2020 07:52   DG CHEST PORT 1 VIEW  Result Date: 10/27/2020 CLINICAL DATA:  Bilateral chest tube placement. EXAM: PORTABLE CHEST 1 VIEW COMPARISON:  Chest x-ray from same day at 0611 hours. FINDINGS: Unchanged endotracheal and enteric tubes. Unchanged left chest wall port catheter and left internal jugular dialysis catheter. New right and left chest tubes with resolved bilateral pleural effusions and improved aeration at the lung bases. No definite pneumothorax, although the left lung apex is excluded from the field of view. Stable cardiomediastinal silhouette status post CABG. No acute osseous abnormality. IMPRESSION: 1. New bilateral chest tubes with resolved bilateral pleural effusions and improved aeration at the lung bases. Electronically Signed   By: Titus Dubin M.D.   On: 10/27/2020 14:50    Cardiac Studies   Echo 10/22/20:  1. Left ventricular ejection fraction, by estimation, is 30 to 35%. The  left ventricle has moderately decreased function. The left ventricle  demonstrates regional wall motion abnormalities (see scoring  diagram/findings for description). There is mild  left ventricular  hypertrophy. Left ventricular diastolic parameters are  consistent with Grade III diastolic dysfunction (restrictive). Elevated  left atrial pressure.  2. Right ventricular systolic function is normal. The right ventricular  size is normal.  3. The mitral valve is normal in structure. Trivial mitral valve  regurgitation.  4. The aortic valve is calcified. There is moderate calcification of the  aortic valve. Aortic valve regurgitation is not visualized. Mild aortic  valve stenosis.    Patient Profile     64 y.o. male with history of severe multi-vessel CAD s/p CABG and multiple prior PCIs/stents, metastatic colon cancer with pulmonary and hepatic mets, ischemic cardiomyopathy admitted with chest pain and dyspnea and found to have pneumonia, elevated troponin, atrial fib with RVR. He has been treated with IV heparin and amiodarone drip. He has required pressor support.  Assessment & Plan    1. Atrial fib with RVR: sinus in the last 2 days. I would continue IV amiodarone until he is extubated and able to take PO meds. Continue IV heparin for now. He was on Xarelto at home.   2. CAD s/p CABG/Elevated troponin: He has severe CAD. Troponin elevation in the setting of pneumonia and hypotension. LVEF is slightly worse. No plans for further invasive cardiac evaluation at this time.  -Continue ASA, Plavix and statin.     3. Acute hypoxic respiratory failure with extensive multi-focal pulmonary infiltrates and pneumonia. Intubated and being followed by the PCCM service. Weaning trials today.   4. Acute on chronic systolic CHF: Volume removal by CRRT, no signs of fluid overload on exam, CVP 9 today. Significant diuresis in the last 24 H -2.7 L.  5. Acute renal failure: Nephrology following, improving 1.78->1.64-> 1.52  Plan for home with Hospice when extubated.   For questions or updates, please contact [place] Please consult [email or web address] for contact info under   Signed, Ena Dawley, MD  10/29/2020, 9:22 AM

## 2020-10-29 NOTE — Progress Notes (Signed)
ANTICOAGULATION CONSULT NOTE Pharmacy Consult for Heparin Indication: chest pain/ACS   Assessment: 64 y.o. male admitted with chest pain and history of atrial fibrillation on Xarelto prior to admission. Xarelto is on hold and patient transitioned to IV Heparin.   Heparin level at goal this morning at0.5. Hgb is low but improved at 8.3, plt normal, does not appear he received transfusion. No bleeding issues noted.   Goal of Therapy:  Heparin level 0.3-0.7 units/mL Monitor platelets by anticoagulation protocol: Yes   Plan:  Will continue heparin at 1600 units/hr  Daily heparin level, CBC  Thanks for allowing pharmacy to be a part of this patient's care.  Erin Hearing PharmD., BCPS Clinical Pharmacist 10/29/2020 10:54 AM

## 2020-10-29 NOTE — TOC Initial Note (Addendum)
Transition of Care [facility]) - Initial/Assessment Note    Patient Details  Name: Ian [name] MRN: [id number] Date of Birth: June 28, 1956  Transition of Care [facility]) CM/SW Contact:    Marilu Favre, RN Phone Number: 10/29/2020, 4:43 PM  Clinical Narrative:                 Received referral for home with hospice, discharge Monday or Tuesday.   Called patient's wife Earnest Mcgillis [phone] confirmed plan and face sheet information.   Offered choice , Mrs [place] prefers [facility]. Patient has no DME in home already   [place], spoke to [place] , gave referral information and faxed clinicals to [phone].   Spoke to [place] with on call nurse for [facility]. She will order hospital bed, over bed table and oxygen. She will call patient's wife to discuss. With patient's DX, HX hospice cannot admit until Monday.   Expected Discharge Plan: [place]     Patient Goals and CMS Choice     Choice offered to / list presented to : Spouse  Expected Discharge Plan and Services Expected Discharge Plan: Home w Hospice Care   Discharge Planning Services: CM Consult Post Acute Care Choice: Hospice Living arrangements for the past 2 months: Single Family Home                   DME Agency: NA       HH Arranged: NA          Prior Living Arrangements/Services Living arrangements for the past 2 months: Single Family Home Lives with:: Spouse                   Activities of Daily Living Home Assistive Devices/Equipment: None ADL Screening (condition at time of admission) Patient's cognitive ability adequate to safely complete daily activities?: Yes Is the patient deaf or have difficulty hearing?: No Does the patient have difficulty seeing, even when wearing glasses/contacts?: No Does the patient have difficulty concentrating, remembering, or making decisions?: No Patient able to express need for assistance with ADLs?:  Yes Does the patient have difficulty dressing or bathing?: Yes Independently performs ADLs?: Yes (appropriate for developmental age) Does the patient have difficulty walking or climbing stairs?: Yes Weakness of Legs: Both Weakness of Arms/Hands: Both  Permission Sought/Granted                  Emotional Assessment              Admission diagnosis:  Unstable angina ([place]) [I20.0] Acute coronary syndrome (HCC) [I24.9] NSTEMI (non-ST elevated myocardial infarction) ([place]) [I21.4] Acute heart failure, unspecified heart failure type ([place]) [I50.9] Patient Active Problem List   Diagnosis Date Noted  . Atrial fibrillation with RVR ([place])   . Pleural effusion, left   . Pleural effusion   . Multifocal pneumonia   . Acute respiratory failure with hypoxia ([place])   . Acute heart failure ([place])   . Malnutrition of moderate degree 10/21/2020  . Goals of care, counseling/discussion 10/11/2020  . Mixed hyperlipidemia 07/26/2020  . PAD (peripheral artery disease) ([place]) 02/25/2020  . Chest pain 02/22/2020  . ST elevation myocardial infarction (STEMI) of inferolateral wall ([place])   . Unstable angina ([place]) 01/04/2020  . Acute coronary syndrome ([place])   . Hx of CABG   . Presence of drug-eluting stent in RCA, LM-LCx 09/15/2019  . Non-ST elevation (NSTEMI) myocardial infarction ([place]) 09/14/2019  .  NSTEMI (non-ST elevated myocardial infarction) ([place]) 09/14/2019  . S/P partial colectomy 09/17/2018  . Malignant neoplasm of sigmoid colon ([place])   . Liver mass, right lobe   . Dysfunctional ventilatory weaning response ([place]) 02/07/2015  . Palpitations, recurrent 01/10/2013  . Obesity 01/10/2013  . Essential hypertension 01/10/2013  . Coronary artery disease involving native coronary artery of native heart with unstable angina pectoris ([place]) 01/10/2013  . Hyperlipidemia associated with type 2 diabetes mellitus ([place]) 01/10/2013  . OSA (obstructive sleep apnea), probable 01/10/2013  . Diabetes  mellitus type II, uncontrolled ([place]) 01/10/2013  . 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 Chest tube in place 01/10/2013   PCP:  Sharilyn Sites, MD Pharmacy:   [facility] [place] [address] [place] [address] Phone: [phone] Fax: [place], [place] - [address] [facility]. [address] [place] [address] Phone: [phone] Fax: [phone]  [facility] [phone] - [place], [place] [address] [place] [address] [place] [address] Phone: [phone] Fax: [phone]  [facility] [place], [place] - [address] [place] [address] Phone: [phone] Fax: [phone]     Social Determinants of Health (SDOH) Interventions    Readmission Risk Interventions Readmission Risk Prevention Plan 09/15/2019  Transportation Screening Complete  PCP or Specialist Appt within 3-5 Days Not Complete  Not Complete comments continual medical workup  HRI or [place] Complete  Social Work Consult for [place] Planning/Counseling Complete  Palliative Care Screening Not Applicable  Medication Review Press photographer) Complete  Some recent data might be hidden

## 2020-10-30 LAB — HEPARIN LEVEL (UNFRACTIONATED)
Heparin Unfractionated: 0.76 IU/mL — ABNORMAL HIGH (ref 0.30–0.70)
Heparin Unfractionated: 1.28 IU/mL — ABNORMAL HIGH (ref 0.30–0.70)
Heparin Unfractionated: 1.2 IU/mL — ABNORMAL HIGH (ref 0.30–0.70)

## 2020-10-30 LAB — RENAL FUNCTION PANEL
Albumin: 2.9 g/dL — ABNORMAL LOW (ref 3.5–5.0)
Albumin: 2.7 g/dL — ABNORMAL LOW (ref 3.5–5.0)
Anion gap: 16 — ABNORMAL HIGH (ref 5–15)
Anion gap: 15 (ref 5–15)
BUN: 34 mg/dL — ABNORMAL HIGH (ref 8–23)
BUN: 32 mg/dL — ABNORMAL HIGH (ref 8–23)
CO2: 21 mmol/L — ABNORMAL LOW (ref 22–32)
CO2: 22 mmol/L (ref 22–32)
Calcium: 9.5 mg/dL (ref 8.9–10.3)
Calcium: 8.8 mg/dL — ABNORMAL LOW (ref 8.9–10.3)
Chloride: 96 mmol/L — ABNORMAL LOW (ref 98–111)
Chloride: 94 mmol/L — ABNORMAL LOW (ref 98–111)
Creatinine, Ser: 1.86 mg/dL — ABNORMAL HIGH (ref 0.61–1.24)
Creatinine, Ser: 1.78 mg/dL — ABNORMAL HIGH (ref 0.61–1.24)
GFR, Estimated: 40 mL/min — ABNORMAL LOW (ref >60)
GFR, Estimated: 42 mL/min — ABNORMAL LOW (ref >60)
Glucose, Bld: 175 mg/dL — ABNORMAL HIGH (ref 70–99)
Glucose, Bld: 185 mg/dL — ABNORMAL HIGH (ref 70–99)
Phosphorus: 4 mg/dL (ref 2.5–4.6)
Phosphorus: 3.8 mg/dL (ref 2.5–4.6)
Potassium: 5.1 mmol/L (ref 3.5–5.1)
Potassium: 4.1 mmol/L (ref 3.5–5.1)
Sodium: 133 mmol/L — ABNORMAL LOW (ref 135–145)
Sodium: 131 mmol/L — ABNORMAL LOW (ref 135–145)

## 2020-10-30 LAB — GLUCOSE, CAPILLARY
Glucose-Capillary: 178 mg/dL — ABNORMAL HIGH (ref 70–99)
Glucose-Capillary: 217 mg/dL — ABNORMAL HIGH (ref 70–99)
Glucose-Capillary: 154 mg/dL — ABNORMAL HIGH (ref 70–99)
Glucose-Capillary: 210 mg/dL — ABNORMAL HIGH (ref 70–99)
Glucose-Capillary: 157 mg/dL — ABNORMAL HIGH (ref 70–99)
Glucose-Capillary: 95 mg/dL (ref 70–99)

## 2020-10-30 LAB — MAGNESIUM: Magnesium: 2.9 mg/dL — ABNORMAL HIGH (ref 1.7–2.4)

## 2020-10-30 LAB — COOXEMETRY PANEL
Carboxyhemoglobin: 1.1 % (ref 0.5–1.5)
Methemoglobin: 0.8 % (ref 0.0–1.5)
O2 Saturation: 48.5 %
Total hemoglobin: 8.9 g/dL — ABNORMAL LOW (ref 12.0–16.0)

## 2020-10-30 LAB — BRAIN NATRIURETIC PEPTIDE: B Natriuretic Peptide: 484 pg/mL — ABNORMAL HIGH (ref 0.0–100.0)

## 2020-10-30 MED ORDER — HEPARIN (PORCINE) 25000 UT/250ML-% IV SOLN
1200.0000 [IU]/h | INTRAVENOUS | Status: DC
  Administered 2020-10-30: 18:00:00 1300 [IU]/h via INTRAVENOUS
  Administered 2020-10-31: 20:00:00 1200 [IU]/h via INTRAVENOUS
  Filled 2020-10-30 (×2): qty 250

## 2020-10-30 MED ORDER — LOSARTAN POTASSIUM 25 MG PO TABS: 25.0000 mg | ORAL_TABLET | Freq: Every day | ORAL | Status: DC

## 2020-10-30 MED ORDER — PRISMASOL BGK 0/2.5 32-2.5 MEQ/L EC SOLN
Status: DC
  Filled 2020-10-30 (×7): qty 5000

## 2020-10-30 MED ORDER — OXYCODONE HCL 5 MG PO TABS
5.0000 mg | ORAL_TABLET | Freq: Four times a day (QID) | ORAL | Status: DC | PRN
  Administered 2020-10-30 – 2020-11-01 (×2): 5 mg via ORAL
  Filled 2020-10-30 (×2): qty 1

## 2020-10-30 MED ORDER — CARVEDILOL 3.125 MG PO TABS
3.1250 mg | ORAL_TABLET | Freq: Two times a day (BID) | ORAL | Status: DC
  Administered 2020-10-30 – 2020-11-01 (×3): 3.125 mg via ORAL
  Filled 2020-10-30 (×4): qty 1

## 2020-10-30 MED ORDER — PRISMASOL BGK 0/2.5 32-2.5 MEQ/L EC SOLN
Status: DC
  Filled 2020-10-30 (×20): qty 5000

## 2020-10-30 MED ORDER — PRISMASOL BGK 0/2.5 32-2.5 MEQ/L EC SOLN
Status: DC
  Filled 2020-10-30 (×5): qty 5000

## 2020-10-30 MED ORDER — AMIODARONE HCL 200 MG PO TABS
200.0000 mg | ORAL_TABLET | Freq: Every day | ORAL | Status: DC
  Administered 2020-10-30 – 2020-11-01 (×3): 200 mg via ORAL
  Filled 2020-10-30 (×3): qty 1

## 2020-10-30 NOTE — Progress Notes (Signed)
ANTICOAGULATION CONSULT NOTE Pharmacy Consult for Heparin Indication: chest pain/ACS   Assessment: 64 y.o. male admitted with chest pain and history of atrial fibrillation on Xarelto prior to admission. Xarelto is on hold and patient transitioned to IV Heparin.   Heparin level slightly supratherapeutic (0.76) this morning on 1600 units/hr. No bleeding noted.  Goal of Therapy:  Heparin level 0.3-0.7 units/mL Monitor platelets by anticoagulation protocol: Yes   Plan:  Decrease heparin to 1500 units/hr  F/u 6hr heparin level  Thanks for allowing pharmacy to be a part of this patient's care. Sherlon Handing, PharmD, BCPS Please see amion for complete clinical pharmacist phone list 10/30/2020 4:39 AM

## 2020-10-30 NOTE — Progress Notes (Signed)
ANTICOAGULATION CONSULT NOTE Pharmacy Consult for Heparin Indication: chest pain/ACS   Assessment: 64 y.o. male admitted with chest pain and history of atrial fibrillation on Xarelto prior to admission. Xarelto is on hold and patient transitioned to IV Heparin.   Heparin level supratherapeutic 1.2 x 2 repeated by peripheral stick this afternoon on 1500 units/hr. No bleeding noted per nursing. CBC not done this am.   Goal of Therapy:  Heparin level 0.3-0.7 units/mL Monitor platelets by anticoagulation protocol: Yes   Plan:  Hold for 1 hour Decrease heparin to 1300 units/hr  F/u 8hr heparin level  Erin Hearing PharmD., BCPS Clinical Pharmacist 10/30/2020 9:51 AM

## 2020-10-30 NOTE — Progress Notes (Signed)
Progress Note  Patient Name: Ian [name] Date of Encounter: 10/30/2020  [facility] Cardiologist: Sherren Mocha, MD   Subjective   The patient got extubated yesterday, he complains of back pain and pain from the Foley catheter.   Inpatient Medications    Scheduled Meds: . aspirin  81 mg Oral Daily  . B-complex with vitamin C  1 tablet Oral Daily  . chlorhexidine gluconate (MEDLINE KIT)  15 mL Mouth Rinse BID  . Chlorhexidine Gluconate Cloth  6 each Topical Daily  . clopidogrel  75 mg Oral Daily  . docusate sodium  100 mg Oral BID  . guaiFENesin  20 mL Oral Q8H  . insulin aspart  0-20 Units Subcutaneous Q4H  . insulin detemir  12 Units Subcutaneous BID  . levalbuterol  0.63 mg Nebulization TID  . pantoprazole  40 mg Oral Daily  . polyethylene glycol  17 g Oral Daily  . rosuvastatin  10 mg Oral QHS  . sodium chloride flush  10 mL Intracatheter Q8H  . sodium chloride flush  10 mL Intracatheter Q8H   Continuous Infusions: .  prismasol BGK 4/2.5 500 mL/hr at 10/29/20 0644  .  prismasol BGK 4/2.5 300 mL/hr at 10/29/20 2000  . amiodarone 30 mg/hr (10/30/20 0828)  . ceFEPime (MAXIPIME) IV 2 g (10/29/20 2134)  . dexmedetomidine (PRECEDEX) IV infusion Stopped (10/29/20 1005)  . fentaNYL infusion INTRAVENOUS Stopped (10/29/20 1005)  . heparin 1,500 Units/hr (10/30/20 0700)  . norepinephrine (LEVOPHED) Adult infusion Stopped (10/28/20 0430)  . phenylephrine (NEO-SYNEPHRINE) Adult infusion Stopped (10/21/20 1626)  . prismasol BGK 4/2.5 1,500 mL/hr at 10/29/20 0644  . vancomycin Stopped (10/29/20 1245)   PRN Meds: acetaminophen (TYLENOL) oral liquid 160 mg/5 mL, fentaNYL, heparin, LORazepam, metoprolol tartrate, ondansetron (ZOFRAN) IV, sodium chloride, sodium chloride flush   Vital Signs    Vitals:   10/30/20 0500 10/30/20 0600 10/30/20 0700 10/30/20 0720  BP:      Pulse: (!) 102 97 98   Resp: 15 (!) 23 (!) 22   Temp:    98.3 F (36.8 C)  TempSrc:    Oral   SpO2: 100% 100% 100%   Weight: 73.6 kg     Height:        Intake/Output Summary (Last 24 hours) at 10/30/2020 0832 Last data filed at 10/30/2020 0700 Gross per 24 hour  Intake 1841.41 ml  Output 6009 ml  Net -4167.59 ml   Last 3 Weights 10/30/2020 10/29/2020 10/28/2020  Weight (lbs) 162 lb 4.1 oz 169 lb 5 oz 177 lb 11.1 oz  Weight (kg) 73.6 kg 76.8 kg 80.6 kg      Telemetry    Sinus rhythm with ventricular rates 94-103 BPM - Personally Reviewed  ECG    No AM EKG - Personally Reviewed  Physical Exam   General: Intubated, awake.  HEENT: ET tube in place SKIN: warm, dry. Psychiatric: awake  Neck: No JVD Lungs:Mechanical breath sounds bilaterally Cardiovascular: Regular rate and rhythm. No loud murmurs Abdomen:Soft. Bowel sounds present.  Extremities: No lower extremity edema.   Labs    High Sensitivity Troponin:   Recent Labs  Lab 10/20/20 0121 10/20/20 0324 10/20/20 0608 10/23/20 2345 10/24/20 0210  TROPONINIHS 1,386* 4,619* 5,929* 4,207* 3,736*      Chemistry Recent Labs  Lab 10/29/20 0451 10/29/20 1821 10/30/20 0334  NA 133* 134* 133*  K 4.2 4.7 5.1  CL 98 97* 96*  CO2 22 22 21*  GLUCOSE 226* 128* 175*  BUN 42* 37*  34*  CREATININE 1.52* 1.62* 1.86*  CALCIUM 8.9 9.3 9.5  ALBUMIN 2.6* 2.8* 2.9*  GFRNONAA 51* 47* 40*  ANIONGAP 13 15 16*     Hematology Recent Labs  Lab 10/27/20 0404 10/28/20 0433 10/29/20 0451  WBC 16.0* 18.0* 18.5*  RBC 2.97* 2.87* 3.16*  HGB 7.5* 7.2* 8.3*  HCT 23.2* 22.5* 25.1*  MCV 78.1* 78.4* 79.4*  MCH 25.3* 25.1* 26.3  MCHC 32.3 32.0 33.1  RDW 18.0* 17.9* 18.3*  PLT 328 306 354    BNP No results for input(s): BNP, PROBNP in the last 168 hours.   DDimer No results for input(s): DDIMER in the last 168 hours.   Radiology    No results found.  Cardiac Studies   Echo 10/22/20:  1. Left ventricular ejection fraction, by estimation, is 30 to 35%. The  left ventricle has moderately decreased function.  The left ventricle  demonstrates regional wall motion abnormalities (see scoring  diagram/findings for description). There is mild  left ventricular hypertrophy. Left ventricular diastolic parameters are  consistent with Grade III diastolic dysfunction (restrictive). Elevated  left atrial pressure.  2. Right ventricular systolic function is normal. The right ventricular  size is normal.  3. The mitral valve is normal in structure. Trivial mitral valve  regurgitation.  4. The aortic valve is calcified. There is moderate calcification of the  aortic valve. Aortic valve regurgitation is not visualized. Mild aortic  valve stenosis.    Patient Profile     64 y.o. male with history of severe multi-vessel CAD s/p CABG and multiple prior PCIs/stents, metastatic colon cancer with pulmonary and hepatic mets, ischemic cardiomyopathy admitted with chest pain and dyspnea and found to have pneumonia, elevated troponin, atrial fib with RVR. He has been treated with IV heparin and amiodarone drip. He has required pressor support.   Assessment & Plan    1. Atrial fib with RVR: sinus in the last 3 days, ventricular rates 94-103, I will obtain a 12 lead ECG to make sure this is not a 2:1 flutter. I would discontinue IV amiodarone and start amiodarone PO 200 mg BID.  Continue IV heparin for now. He was on Xarelto at home.   2. CAD s/p CABG/Elevated troponin: He has severe CAD. Troponin elevation in the setting of pneumonia and hypotension. LVEF is slightly worse. No plans for further invasive cardiac evaluation at this time.  -Continue ASA, Plavix and statin.     3. Acute hypoxic respiratory failure with extensive multi-focal pulmonary infiltrates and pneumonia. Extubated yesterday and doing well.   4. Acute on chronic systolic CHF: Volume removal by CRRT, no signs of fluid overload on exam, Significant diuresis in the last 24 H -4.3 L. Doesn't appear fluid overloaded  5. Acute renal failure:  Nephrology following 1.78->1.64-> 1.52->1.86  Plan for home with Hospice when extubated.   For questions or updates, please contact [place] Please consult [email or web address] for contact info under   Signed, Ena Dawley, MD  10/30/2020, 8:32 AM

## 2020-10-30 NOTE — Progress Notes (Addendum)
[facility] Progress Note   64 year old male  CAD s/p CABG 2008, STEMI 10/20 s/p DES x2 to L radial PDA with DES x2 to protected LM into LCx, STEMi 2/21 s/p DES to L radial PDA, DM, GERD, HTN, HLD, ICM, PAD s/p R fem-pop BPG 4/21, PAF, and metastatic colon cancer with hepatic and pulmonary metastasis with prior mets to liver s/p partial colectomy and chemotherapy p/w PNA, CHF consulted for CRRT.    Assessment/ Plan:   1.  AKI 2/2 cardiorenal syndrome vs ATN:  1. BL Cr around 1.0. Patient has had severe ischemic cardiomyopathy and mixed cardiogenic and distributive shock while admitted. Currently on norepinephrine drip for blood pressure. Cr worsened with attempted diuresis. 2. Not  candidate for long term dialysis however given his respiratory status and goals to attempt to spend his last remaining time with his family. Started CRRT for volume removal in an attempt to wean of the ventilator which was successful. 3. Appreciate CCM placing Left IJ temp on 12/6. 4. CRRT to facilitate offloading fluid to transition to home hospice if possible. Would continue CRRT to ensure more volume removal. Aiming for net neg 100-150cc/hr. Do not restart if circuit/filter expires or clots. Change to all 2k/2.5cal bags 5. Can challenge with lasix 127m IV x 1 dose after CRRT 6. Palliative on board  2. Metabolic acidosis: On CRRT.  3. Acute hypoxic respiratory failure multifactorial from pneumonia, pulmonary metastasis and volume overload: 1. Extubated 12/11. S/p chest tubes 2. Pulmonology following 4. NSTEMI: History of CAD s/p multiple DES, history of acute on chronic systolic heart failure and cardiogenic shock: Continued on heparin and ASA, treating medically for now. Cardiology on board.  5. Afib with RVR: Currently on amiodarone drip, ASA, and digoxin. Remains tachycardic.  6. Metastatic colon cancer with mets to lung and liver: Per primary, f/u with oncology on discharge for possible  palliative chemotherapy if he has improvement but otherwise home hospice 7. Microcytic anemia: Hgb 7.2 today. BL around 10. Likely 2/2 AoCD.  8. Hyperglycemia: Per primary  VGean Quint MD [facility]  Subjective:   Extubated, tolerating crrt. Biggest complaint is pain from foley and rectal tube. uop ~0.5L   Objective:   BP 110/74   Pulse 91   Temp 97.6 F (36.4 C) (Oral)   Resp 17   Ht _0  (1.651 m)   Wt 73.6 kg   SpO2 100%   BMI 27.00 kg/m   Intake/Output Summary (Last 24 hours) at 10/30/2020 1148 Last data filed at 10/30/2020 1100 Gross per 24 hour  Intake 1874.92 ml  Output 6074 ml  Net -4199.08 ml   Weight change: -3.2 kg  Physical Exam: General appearance: nad Cardio: irregularly irregular rhythm Resp: +chest tubes, diminished air entry bibasilar, normal wob GI: soft, non-tender; bowel sounds normal Extremities: trace edema b/l LE's Pulses: 2+ and symmetric Neurologic: Follows commands, speech clear and coherent  Imaging: No results found.  Labs: BMET Recent Labs  Lab 10/27/20 0404 10/27/20 1546 10/28/20 0433 10/28/20 1501 10/29/20 0451 10/29/20 1821 10/30/20 0334  NA 136  136 135 134* 135 133* 134* 133*  K 4.7  4.7 4.8 4.4 4.1 4.2 4.7 5.1  CL 99  99 100 98 99 98 97* 96*  CO2 23  24 21* 21* _1 21*  GLUCOSE 85  86 122* 269* 212* 226* 128* 175*  BUN 45*  44* 42* 45* 43* 42* 37* 34*  CREATININE 1.84*  1.82* 1.75* 1.78*  1.64* 1.52* 1.62* 1.86*  CALCIUM 8.6*  8.6* 8.2* 8.4* 8.7* 8.9 9.3 9.5  PHOS 3.7 3.5 3.2 3.1 3.1 3.8 4.0   CBC Recent Labs  Lab 10/26/20 0401 10/27/20 0404 10/28/20 0433 10/29/20 0451  WBC 14.1* 16.0* 18.0* 18.5*  HGB 7.3* 7.5* 7.2* 8.3*  HCT 22.0* 23.2* 22.5* 25.1*  MCV 76.7* 78.1* 78.4* 79.4*  PLT 257 328 306 354    Medications:    . amiodarone  200 mg Oral Daily  . aspirin  81 mg Oral Daily  . B-complex with vitamin C  1 tablet Oral Daily  . carvedilol  3.125 mg Oral BID WC  .  chlorhexidine gluconate (MEDLINE KIT)  15 mL Mouth Rinse BID  . Chlorhexidine Gluconate Cloth  6 each Topical Daily  . clopidogrel  75 mg Oral Daily  . docusate sodium  100 mg Oral BID  . guaiFENesin  20 mL Oral Q8H  . insulin aspart  0-20 Units Subcutaneous Q4H  . insulin detemir  12 Units Subcutaneous BID  . levalbuterol  0.63 mg Nebulization TID  . pantoprazole  40 mg Oral Daily  . polyethylene glycol  17 g Oral Daily  . rosuvastatin  10 mg Oral QHS  . sodium chloride flush  10 mL Intracatheter Q8H  . sodium chloride flush  10 mL Intracatheter Q8H

## 2020-10-30 NOTE — Progress Notes (Addendum)
NAME:  Ian [name], MRN:  [id number], DOB:  10/26/1956, LOS: 54 ADMISSION DATE:  10/19/2020, CONSULTATION DATE:  10/21/2020 REFERRING MD: Beau Fanny , CHIEF COMPLAINT:  Airway and vent management   Brief History   64 year old male with history of NSTEMI 09/30/2020, treated with conservative management with increasing nitrates and adding Ranexa.  11/10 he was diagnosed with multiple new hepatic and pulmonary metastatic areas with mild anterior bladder wall thickening.  He was referred to oncology with plan to start chemo next week.  He had an ED visit 11/30, and returned 10/19/2020 due to chest pain is occurring multiple times per day, at rest and with exertion. He was unable to perform any task without angina. He was admitted by the cardiology service. CTA was done which was + for extensive multifocal pulmonary infiltrate and dense consolidation of the right middle lobe most in keeping with acute infection. Small associated right parapneumonic effusion.He was place on BiPAP 10/21/2020 for desats on HFNC.  PCCM have been consulted for high risk for  intubation and vent management.     Past Medical History    Past Medical History:  Diagnosis Date  . 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 Colon cancer ([place])   . Complication of anesthesia    atypical pseudo-cholinesterase deficiency  . Coronary artery disease 01/10/2013   s/p CABG 2008 // s/p STEMI in 08/2019>>DES x 2 to L radial-PDA; staged PCI: DES x 2 to protected LM into LCx // s/p inf-lat STEMI 12/2019 >> DES to L Radial-PDA  . Diabetes mellitus   . Family Hx of adverse reaction to anesthesia    sister also has atypical pseudo-cholinesterase deficiency  . Gastroesophageal reflux disease   . History of heart bypass surgery   . HLD (hyperlipidemia)   . Hypertension   . Ischemic cardiomyopathy    Echo 01/04/2020: EF 45-50, mild LVH, Gr 2 DD, normal RVSF, mild to mod LAE, mild MR, mild AS (mean gradient 6 mmHg)  . Mild aortic stenosis    . Mild mitral regurgitation   . Myocardial infarction ([place])   . PAD (peripheral artery disease) ([facility])    s/p R fem-pop bypass 02/2020 (Dr. Donnetta Hutching)  . Paroxysmal atrial fibrillation ([facility])    CHADS-VASc=3 // noted post op after fem pop bypass in 02/2020; due to high risk, Apixaban started    [place] Hospital Events   10/19/2020 Admission Recent admission 09/30/2020-10/01/2020 ED Visit 10/18/2020 12/6 still in shock, still volume overloaded.  Renal function and acid-base worse.  Nephrology consulted.  Starting CRRT. 12/7 minimal volume removed 12/8 looks more comfortable, still over 9 L positive, briefly tolerated pressure support ventilation, heart rate better controlled than it had been.  Overall looking better but not ready for extubation 12/9 work of breathing worse.  White blood cell count climbing, checking sputum culture.  Not able to wean Consults:  10/21/2020 PCCM  Procedures:  10/21/2020 Intubation 12/6 left IJ HD catheter placed Significant Diagnostic Tests:  CTA  10/20/2020 Interval development of extensive multifocal pulmonary infiltrate and dense consolidation of the right middle lobe most in keeping with acute infection. Small associated right parapneumonic effusion. No central obstructing lesion. Multiple bilateral pulmonary nodules are identified, but partially obscured, in keeping with pulmonary metastatic disease. Mild left ventricular dilation. No pulmonary embolism. Aortic Atherosclerosis (ICD10-I70.0).  Heart cath 01/04/20: 1. Severe multivessel coronary artery disease with chronic total occlusion of the LAD and RCA, continued patency of the left mainstem and left circumflex stents with  focal mild to moderate in-stent restenosis at the transition of the left main into the circumflex. 2. Status post aortocoronary bypass surgery with continued patency of the LIMA to LAD graft and severe aorto ostial stenosis of the radial graft to PDA treated successfully with  PCI using a 3.0 x 30 mm resolute Onyx DES with intravascular ultrasound guidance 3. Nonobstructive ostial left circumflex restenosis as demonstrated by intravascular ultrasound with a minimal lumen area of 5.96 mm  Recommendations: Long-term dual antiplatelet therapy with aspirin and Effient, continued aggressive medical therapy, 2D echocardiogram for assessment of LV systolic function.   Echo 09/30/20: 1. Left ventricular ejection fraction, by estimation, is 40 to 45%. The  left ventricle has mildly decreased function. The left ventricle  demonstrates global hypokinesis. Left ventricular diastolic parameters are  consistent with Grade II diastolic  dysfunction (pseudonormalization).  2. Right ventricular systolic function is normal. The right ventricular  size is normal. There is normal pulmonary artery systolic pressure. The  estimated right ventricular systolic pressure is 91.6 mmHg.  3. Left atrial size was moderately dilated.  4. The mitral valve is normal in structure. Mild mitral valve  regurgitation. No evidence of mitral stenosis.  5. The aortic valve is tricuspid. Aortic valve regurgitation is not  visualized. Mild aortic valve stenosis. Aortic valve mean gradient  measures 11.0 mmHg.  6. The inferior vena cava is dilated in size with >50% respiratory  variability, suggesting right atrial pressure of 8 mmHg.  7. Technically difficult study with very poor images even with Definity.  For more accurate EF determination, would consider cardiac MR.   Micro Data:  10/20/2020 Blood Cx: pending 10/19/2020 Covid 19 Negative 10/19/2020 Influenza A&B Negative 12/9 respiratory culture>>> Antimicrobials:  Azithromycin 10/21/2020>> completed 5 days Rocephin 10/21/2020>> pleated 5 days  Interim history/subjective:  Successfully extubated yesterday.  Today complaining mainly of catheter related pain and dental pain.  Eager to eat  Objective   Blood pressure 108/65, pulse 90,  temperature 98.6 F (37 C), temperature source Axillary, resp. rate 17, height 5\' 5"  (1.651 m), weight 73.6 kg, SpO2 100 %. CVP:  [9 mmHg] 9 mmHg      Intake/Output Summary (Last 24 hours) at 10/30/2020 1743 Last data filed at 10/30/2020 1700 Gross per 24 hour  Intake 1447.13 ml  Output 4624 ml  Net -3176.87 ml   Filed Weights   10/28/20 0500 10/29/20 0500 10/30/20 0500  Weight: 80.6 kg 76.8 kg 73.6 kg    Examination:  General 64 year old white male sitting comfortably in bed HEENT normocephalic atraumatic, voice clear Pulmonary: Chest clear, bilateral chest tubes in place with minimal drainage Cardiac regular rate and rhythm Abdomen soft not tender Extremities trace edema, extremities are warm today, pulses are palpable. GU minimal urine Neuro appropriate, follows commands.  Fully conversant.  Diffuse weakness  Resolved Hospital Problem list     Assessment & Plan:  Acute Respiratory Failure in setting of extensive multifocal pulmonary infiltrates and dense consolidation of the right middle lobe working diagnosis pneumonia (NOS), superimposed on pulmonary metastasis and complicated by volume overload  Non-ST elevation MI in context of known CAD s/p CABG with severe disease in 3/4 grafts; complicated by Acute on Chronic systolic HF Atrial Fibrillation with RVR on amiodarone Cardiorenal and ATN leading to acute renal failure  Anemia w/out evidence of bleeding Hyperglycemia/hypoglycemia Metastatic Colon Cancer with new involvement Lung and Liver  Plan:  -Progressive ambulation -We will remove catheters, arterial line -Initiate guideline directed heart failure therapy, if we are  able to keep his heart failure well compensated with medical management this will improve his quality of life and allow for a more successful transition to home hospice for cancer care. -Continue CRRT today.  Now that he has been volume unloaded he may respond once again to diuretic therapy which  would again facilitate management at home. -Transition to oral amiodarone. -Chest tube to waterseal today.  Pull tomorrow if no reaccumulation  Best practice (evaluated daily)   Diet: Progressive diet Pain/Anxiety/Delirium protocol (if indicated): Oxycodone as needed VAP protocol (if indicated): Now extubate DVT prophylaxis: Heparin gtt GI prophylaxis: Protonix Glucose control: see above Mobility: Up to chair last date of multidisciplinary goals of care discussion: Completed once again on 12/12; NP, MD, patient and his son.  Goal is to transition to home hospice off the patient best quality of life possible. Code Status: DNR.   disposition: ICU, ready for transfer to PCU once off CRRT  CRITICAL CARE Performed by: Kipp Brood   Total critical care time: 40 minutes  Critical care time was exclusive of separately billable procedures and treating other patients.  Critical care was necessary to treat or prevent imminent or life-threatening deterioration.  Critical care was time spent personally by me on the following activities: development of treatment plan with patient and/or surrogate as well as nursing, discussions with consultants, evaluation of patient's response to treatment, examination of patient, obtaining history from patient or surrogate, ordering and performing treatments and interventions, ordering and review of laboratory studies, ordering and review of radiographic studies, pulse oximetry, re-evaluation of patient's condition and participation in multidisciplinary rounds.  Kipp Brood, MD [facility] ICU Physician [place]  Pager: [phone] Mobile: [phone] After hours: [phone].

## 2020-10-31 ENCOUNTER — Inpatient Hospital Stay (HOSPITAL_COMMUNITY): Payer: Federal, State, Local not specified - PPO

## 2020-10-31 DIAGNOSIS — J80 Acute respiratory distress syndrome: Secondary | ICD-10-CM

## 2020-10-31 LAB — RENAL FUNCTION PANEL
Albumin: 2.5 g/dL — ABNORMAL LOW (ref 3.5–5.0)
Albumin: 2.4 g/dL — ABNORMAL LOW (ref 3.5–5.0)
Anion gap: 14 (ref 5–15)
Anion gap: 10 (ref 5–15)
BUN: 28 mg/dL — ABNORMAL HIGH (ref 8–23)
BUN: 26 mg/dL — ABNORMAL HIGH (ref 8–23)
CO2: 23 mmol/L (ref 22–32)
CO2: 22 mmol/L (ref 22–32)
Calcium: 9 mg/dL (ref 8.9–10.3)
Calcium: 8.2 mg/dL — ABNORMAL LOW (ref 8.9–10.3)
Chloride: 95 mmol/L — ABNORMAL LOW (ref 98–111)
Chloride: 97 mmol/L — ABNORMAL LOW (ref 98–111)
Creatinine, Ser: 1.78 mg/dL — ABNORMAL HIGH (ref 0.61–1.24)
Creatinine, Ser: 1.67 mg/dL — ABNORMAL HIGH (ref 0.61–1.24)
GFR, Estimated: 42 mL/min — ABNORMAL LOW (ref >60)
GFR, Estimated: 46 mL/min — ABNORMAL LOW (ref >60)
Glucose, Bld: 110 mg/dL — ABNORMAL HIGH (ref 70–99)
Glucose, Bld: 307 mg/dL — ABNORMAL HIGH (ref 70–99)
Phosphorus: 3.8 mg/dL (ref 2.5–4.6)
Phosphorus: 3 mg/dL (ref 2.5–4.6)
Potassium: 3.7 mmol/L (ref 3.5–5.1)
Potassium: 3.4 mmol/L — ABNORMAL LOW (ref 3.5–5.1)
Sodium: 132 mmol/L — ABNORMAL LOW (ref 135–145)
Sodium: 129 mmol/L — ABNORMAL LOW (ref 135–145)

## 2020-10-31 LAB — GLUCOSE, CAPILLARY
Glucose-Capillary: 106 mg/dL — ABNORMAL HIGH (ref 70–99)
Glucose-Capillary: 188 mg/dL — ABNORMAL HIGH (ref 70–99)
Glucose-Capillary: 153 mg/dL — ABNORMAL HIGH (ref 70–99)
Glucose-Capillary: 181 mg/dL — ABNORMAL HIGH (ref 70–99)
Glucose-Capillary: 222 mg/dL — ABNORMAL HIGH (ref 70–99)

## 2020-10-31 LAB — CYTOLOGY - NON PAP

## 2020-10-31 LAB — HEPARIN LEVEL (UNFRACTIONATED)
Heparin Unfractionated: 0.48 IU/mL (ref 0.30–0.70)
Heparin Unfractionated: 0.75 IU/mL — ABNORMAL HIGH (ref 0.30–0.70)

## 2020-10-31 LAB — MAGNESIUM: Magnesium: 2.7 mg/dL — ABNORMAL HIGH (ref 1.7–2.4)

## 2020-10-31 MED ORDER — SODIUM CHLORIDE 0.9% IV SOLUTION: Freq: Once | INTRAVENOUS | Status: DC

## 2020-10-31 MED ORDER — ALTEPLASE 2 MG IJ SOLR: 2.0000 mg | Freq: Once | INTRAMUSCULAR | Status: DC

## 2020-10-31 MED ORDER — INSULIN ASPART 100 UNIT/ML ~~LOC~~ SOLN
0.0000 [IU] | Freq: Three times a day (TID) | SUBCUTANEOUS | Status: DC
  Administered 2020-10-31: 21:00:00 7 [IU] via SUBCUTANEOUS

## 2020-10-31 NOTE — Progress Notes (Signed)
Progress Note  Patient Name: Ian [name] Date of Encounter: 10/31/2020  [facility] Cardiologist: Sherren Mocha, MD   Subjective   Complains of sore tongue. Breathing is OK. Cough is uncomfortable. No other chest pain.   Inpatient Medications    Scheduled Meds: . amiodarone  200 mg Oral Daily  . aspirin  81 mg Oral Daily  . B-complex with vitamin C  1 tablet Oral Daily  . carvedilol  3.125 mg Oral BID WC  . chlorhexidine gluconate (MEDLINE KIT)  15 mL Mouth Rinse BID  . Chlorhexidine Gluconate Cloth  6 each Topical Daily  . clopidogrel  75 mg Oral Daily  . docusate sodium  100 mg Oral BID  . guaiFENesin  20 mL Oral Q8H  . insulin aspart  0-20 Units Subcutaneous Q4H  . insulin detemir  12 Units Subcutaneous BID  . levalbuterol  0.63 mg Nebulization TID  . pantoprazole  40 mg Oral Daily  . polyethylene glycol  17 g Oral Daily  . rosuvastatin  10 mg Oral QHS  . sodium chloride flush  10 mL Intracatheter Q8H  . sodium chloride flush  10 mL Intracatheter Q8H   Continuous Infusions: . ceFEPime (MAXIPIME) IV 2 g (10/30/20 2211)  . heparin 1,200 Units/hr (10/31/20 0704)  . prismasol BGK 2/2.5 dialysis solution 1,500 mL/hr at 10/30/20 1121  . prismasol BGK 2/2.5 replacement solution 500 mL/hr at 10/30/20 1119  . prismasol BGK 2/2.5 replacement solution 300 mL/hr at 10/30/20 1118  . vancomycin 1,000 mg (10/30/20 0914)   PRN Meds: acetaminophen (TYLENOL) oral liquid 160 mg/5 mL, heparin, LORazepam, metoprolol tartrate, ondansetron (ZOFRAN) IV, oxyCODONE, sodium chloride, sodium chloride flush   Vital Signs    Vitals:   10/31/20 0500 10/31/20 0600 10/31/20 0700 10/31/20 0801  BP: 120/73 107/61 102/72   Pulse: 95 91 91   Resp: '20 20 19   ' Temp:      TempSrc:      SpO2: 100% 100% 100% 97%  Weight:      Height:        Intake/Output Summary (Last 24 hours) at 10/31/2020 0805 Last data filed at 10/31/2020 0730 Gross per 24 hour  Intake 826.36 ml  Output 2540 ml   Net -1713.64 ml   Last 3 Weights 10/31/2020 10/30/2020 10/29/2020  Weight (lbs) 161 lb 9.6 oz 162 lb 4.1 oz 169 lb 5 oz  Weight (kg) 73.3 kg 73.6 kg 76.8 kg      Telemetry    Sinus rhythm- Personally Reviewed  ECG    EKG yesterday showed NSR with LVH and inferolateral ST-T depression- Personally Reviewed  Physical Exam   General: awake conversant  HEENT: left IJ in place. Also left portAcath accessed SKIN: warm, dry. Neck: No JVD Lungs:clear anteriorly Cardiovascular: Regular rate and rhythm. No murmur or gallop Abdomen:Soft. Bowel sounds present.  Extremities: No lower extremity edema.   Labs    High Sensitivity Troponin:   Recent Labs  Lab 10/20/20 0121 10/20/20 0324 10/20/20 0608 10/23/20 2345 10/24/20 0210  TROPONINIHS 1,386* 4,619* 5,929* 4,207* 3,736*      Chemistry Recent Labs  Lab 10/30/20 0334 10/30/20 1614 10/31/20 0350  NA 133* 131* 132*  K 5.1 4.1 3.7  CL 96* 94* 95*  CO2 21* 22 23  GLUCOSE 175* 185* 110*  BUN 34* 32* 28*  CREATININE 1.86* 1.78* 1.78*  CALCIUM 9.5 8.8* 9.0  ALBUMIN 2.9* 2.7* 2.5*  GFRNONAA 40* 42* 42*  ANIONGAP 16* 15 14  Hematology Recent Labs  Lab 10/27/20 0404 10/28/20 0433 10/29/20 0451  WBC 16.0* 18.0* 18.5*  RBC 2.97* 2.87* 3.16*  HGB 7.5* 7.2* 8.3*  HCT 23.2* 22.5* 25.1*  MCV 78.1* 78.4* 79.4*  MCH 25.3* 25.1* 26.3  MCHC 32.3 32.0 33.1  RDW 18.0* 17.9* 18.3*  PLT 328 306 354    BNP Recent Labs  Lab 10/30/20 1049  BNP 484.0*     DDimer No results for input(s): DDIMER in the last 168 hours.   Radiology    No results found.  Cardiac Studies   Echo 10/22/20:  1. Left ventricular ejection fraction, by estimation, is 30 to 35%. The  left ventricle has moderately decreased function. The left ventricle  demonstrates regional wall motion abnormalities (see scoring  diagram/findings for description). There is mild  left ventricular hypertrophy. Left ventricular diastolic parameters are   consistent with Grade III diastolic dysfunction (restrictive). Elevated  left atrial pressure.  2. Right ventricular systolic function is normal. The right ventricular  size is normal.  3. The mitral valve is normal in structure. Trivial mitral valve  regurgitation.  4. The aortic valve is calcified. There is moderate calcification of the  aortic valve. Aortic valve regurgitation is not visualized. Mild aortic  valve stenosis.    Patient Profile     64 y.o. male with history of severe multi-vessel CAD s/p CABG and multiple prior PCIs/stents, metastatic colon cancer with pulmonary and hepatic mets, ischemic cardiomyopathy admitted with chest pain and dyspnea and found to have pneumonia, elevated troponin, atrial fib with RVR.   Assessment & Plan    1. Atrial fib with RVR: sinus in the last 4 days. Now on po amiodarone. On very low dose Coreg.  Continue IV heparin for now. He was on Xarelto at home. Can consider resuming once chest tube out and no further invasive procedures planned. If Xarelto resumed would stop ASA.   2. CAD s/p CABG/Elevated troponin 5900: He has severe CAD. Troponin elevation in the setting of pneumonia and hypotension. LVEF is slightly worse EF 30-35%. No plans for further invasive cardiac evaluation at this time.  -Continue ASA, Plavix and statin.     3. Acute hypoxic respiratory failure with extensive multi-focal pulmonary infiltrates and pneumonia. Extubated  and doing well. CXR today looks clear.   4. Acute on chronic systolic CHF: EF 28-63%. Volume removal by CRRT, no signs of fluid overload on exam, medical therapy for CHF limited by low BP   5. Acute renal failure: Nephrology following 1.78->1.64-> 1.52->1.86>> 1.78  Plan for home with Hospice   For questions or updates, please contact [place] Please consult [email or web address] for contact info under   Signed, Artemisia Auvil [place], MD  10/31/2020, 8:05 AM

## 2020-10-31 NOTE — Progress Notes (Signed)
Palliative: Ian [name] is lying quietly in bed.  He appears acutely/chronically ill and frail.  He does not wake when I gently call his name or touch his shoulder.  He appears comfortable.  His wife, Ian [name], is at bedside.  Ian [name] and I talked about Ian [name]'s acute health concerns and the treatment plan.  She shares that they have been contacted and are active with hospice of [facility].  They share that equipment has been delivered.  At this point, the goal is for Ian [name] to be medically optimized and then return home.  Ian [name] asks about prognosis.  We talked about expected signs and symptoms as people are nearing end-of-life.  I encouraged Ian [name] to lean on her hospice providers for help.  Ian [name] shares that their son and his family are currently living in their basement as they build their home next door.  We talked about the physical, emotional, spiritual support that having family present can provide.  Conference with attending, bedside nursing staff, transition of care team related to patient condition, needs, goals of care, disposition.  Plan: Optimize medical care for and transfer home with hospice care.  [facility] active with family.  Equipment needed is present at home. Prognosis: Discussed with permission.  10 days would be expected after discharge time.  Encouraged to lean on hospice provider.  25 minutes Quinn Axe, NP Palliative medicine team Team phone [phone] Greater than 50% of this time was spent counseling and coordinating care related to the above assessment and plan.

## 2020-10-31 NOTE — Progress Notes (Signed)
ANTICOAGULATION CONSULT NOTE Pharmacy Consult for Heparin Indication: chest pain/ACS   Assessment: 64 y.o. male admitted with chest pain and history of atrial fibrillation on Xarelto prior to admission. Apixaban is on hold and patient transitioned to IV Heparin.   Heparin level came back therapeutic at 0.48, on 1200 units/hr. No s/sx of bleeding or infusion issues.   Goal of Therapy:  Heparin level 0.3-0.7 units/mL Monitor platelets by anticoagulation protocol: Yes   Plan:  Continue heparin infusion at 1200 units/hr  Monitor daily HL, CBC, and for s/sx of bleeding  Antonietta Jewel, PharmD, [place] Pharmacist  Phone: [phone] 10/31/2020 10:57 AM  Please check AMION for all [place] phone numbers After 10:00 PM, call [place] [phone]

## 2020-10-31 NOTE — Progress Notes (Signed)
Patient ID: Ian [name], male   DOB: Oct 28, 1956, 64 y.o.   MRN: [id number] [facility] Progress Note   Assessment/ Plan:   1. Acute kidney Injury: Secondary to acute cardiorenal syndrome versus ATN-started on CRRT for volume unloading after worsening renal function with attempted diuresis.  Given terminal comorbidities, not a candidate for chronic outpatient hemodialysis and the goal is to transition to home hospice when possible.  Discontinue CRRT when filter clots 2.  Acute hypoxic respiratory failure: Thought to be multifactorial from underlying pneumonia, pulmonary metastasis (metastatic colon cancer) and volume overload/CHF exacerbation.  Started on CRRT for volume unloading after worsening renal function with attempted diuresis. 3.  Hyponatremia: Multifactorial in the setting of worsening renal insufficiency, metastatic cancer and CHF exacerbation. 4.  Atrial fibrillation with rapid ventricular response: On oral amiodarone and low-dose carvedilol; currently sinus tachycardia.  Plans noted to resume Xarelto after chest tubes/invasive procedures are done. 5.  Metastatic colon cancer: With hepatic and pulmonary metastasis.  Avoiding ESA.  Subjective:   Denies any acute events overnight-feels that breathing is getting better.   Objective:   BP (!) 96/54   Pulse (!) 101   Temp (!) 96.6 F (35.9 C) (Axillary)   Resp 19   Ht '5\' 5"'  (1.651 m)   Wt 73.3 kg   SpO2 97%   BMI 26.89 kg/m   Intake/Output Summary (Last 24 hours) at 10/31/2020 0840 Last data filed at 10/31/2020 0730 Gross per 24 hour  Intake 826.36 ml  Output 2540 ml  Net -1713.64 ml   Weight change: -0.3 kg  Physical Exam: Gen: Comfortably resting in bed, nurse at bedside. CVS: Pulse regular tachycardia, S1 and S2 normal without murmur/rub Resp: Poor inspiratory effort, diminished breath sounds over bases.  No distinct rales or rhonchi Abd: Soft, flat, nontender Ext: No lower extremity  edema  Imaging: DG CHEST PORT 1 VIEW  Result Date: 10/31/2020 CLINICAL DATA:  Pleural effusion due to CHF. Chest tube present, shortness of breath, pleural effusion, CHF. EXAM: PORTABLE CHEST 1 VIEW COMPARISON:  Prior chest radiographs 10/28/2020 and earlier. CT chest 10/20/2020. FINDINGS: Interval extubation. Unchanged position of right and left IJ approach central venous catheters and of a left chest infusion port catheter. Unchanged position of bilateral chest tubes. Prior median sternotomy/CABG. Prior coronary artery stenting. Mild cardiomegaly, unchanged. Aortic atherosclerosis. Central pulmonary vascular congestion. Similar appearance of subtle bilateral nodular airspace opacities and more ill-defined airspace disease within the right lung base. No sizable pleural effusion or evidence of pneumothorax. No acute bony abnormality identified. IMPRESSION: Support apparatus as described.  No evidence of pneumothorax. Mild cardiomegaly with central pulmonary vascular congestion, unchanged. Similar appearance of subtle bilateral nodular airspace opacities and more ill-defined airspace disease within the right lung base. Aortic Atherosclerosis (ICD10-I70.0). Electronically Signed   By: Kellie Simmering DO   On: 10/31/2020 08:05    Labs: BMET Recent Labs  Lab 10/28/20 0433 10/28/20 1501 10/29/20 0451 10/29/20 1821 10/30/20 0334 10/30/20 1614 10/31/20 0350  NA 134* 135 133* 134* 133* 131* 132*  K 4.4 4.1 4.2 4.7 5.1 4.1 3.7  CL 98 99 98 97* 96* 94* 95*  CO2 21* '22 22 22 ' 21* 22 23  GLUCOSE 269* 212* 226* 128* 175* 185* 110*  BUN 45* 43* 42* 37* 34* 32* 28*  CREATININE 1.78* 1.64* 1.52* 1.62* 1.86* 1.78* 1.78*  CALCIUM 8.4* 8.7* 8.9 9.3 9.5 8.8* 9.0  PHOS 3.2 3.1 3.1 3.8 4.0 3.8 3.8   CBC Recent Labs  Lab  10/26/20 0401 10/27/20 0404 10/28/20 0433 10/29/20 0451  WBC 14.1* 16.0* 18.0* 18.5*  HGB 7.3* 7.5* 7.2* 8.3*  HCT 22.0* 23.2* 22.5* 25.1*  MCV 76.7* 78.1* 78.4* 79.4*  PLT 257 328 306  354    Medications:    . amiodarone  200 mg Oral Daily  . aspirin  81 mg Oral Daily  . B-complex with vitamin C  1 tablet Oral Daily  . carvedilol  3.125 mg Oral BID WC  . chlorhexidine gluconate (MEDLINE KIT)  15 mL Mouth Rinse BID  . Chlorhexidine Gluconate Cloth  6 each Topical Daily  . clopidogrel  75 mg Oral Daily  . docusate sodium  100 mg Oral BID  . guaiFENesin  20 mL Oral Q8H  . insulin aspart  0-20 Units Subcutaneous Q4H  . insulin detemir  12 Units Subcutaneous BID  . levalbuterol  0.63 mg Nebulization TID  . pantoprazole  40 mg Oral Daily  . polyethylene glycol  17 g Oral Daily  . rosuvastatin  10 mg Oral QHS  . sodium chloride flush  10 mL Intracatheter Q8H  . sodium chloride flush  10 mL Intracatheter Q8H   Elmarie Shiley, MD 10/31/2020, 8:40 AM

## 2020-10-31 NOTE — Progress Notes (Signed)
NAME:  Ian [name], MRN:  [id number], DOB:  06-May-1956, LOS: 82 ADMISSION DATE:  10/19/2020, CONSULTATION DATE:  10/21/2020 REFERRING MD: Beau Fanny , CHIEF COMPLAINT:  Airway and vent management   Brief History   64 year old male with history of NSTEMI 09/30/2020, treated with conservative management with increasing nitrates and adding Ranexa.  11/10 he was diagnosed with multiple new hepatic and pulmonary metastatic areas with mild anterior bladder wall thickening.  He was referred to oncology with plan to start chemo next week.  He had an ED visit 11/30, and returned 10/19/2020 due to chest pain is occurring multiple times per day, at rest and with exertion. He was unable to perform any task without angina. He was admitted by the cardiology service. CTA was done which was + for extensive multifocal pulmonary infiltrate and dense consolidation of the right middle lobe most in keeping with acute infection. Small associated right parapneumonic effusion.He was place on BiPAP 10/21/2020 for desats on HFNC.   Past Medical History    Past Medical History:  Diagnosis Date  . 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 Colon cancer ([place])   . Complication of anesthesia    atypical pseudo-cholinesterase deficiency  . Coronary artery disease 01/10/2013   s/p CABG 2008 // s/p STEMI in 08/2019>>DES x 2 to L radial-PDA; staged PCI: DES x 2 to protected LM into LCx // s/p inf-lat STEMI 12/2019 >> DES to L Radial-PDA  . Diabetes mellitus   . Family Hx of adverse reaction to anesthesia    sister also has atypical pseudo-cholinesterase deficiency  . Gastroesophageal reflux disease   . History of heart bypass surgery   . HLD (hyperlipidemia)   . Hypertension   . Ischemic cardiomyopathy    Echo 01/04/2020: EF 45-50, mild LVH, Gr 2 DD, normal RVSF, mild to mod LAE, mild MR, mild AS (mean gradient 6 mmHg)  . Mild aortic stenosis   . Mild mitral regurgitation   . Myocardial infarction ([place])   . PAD  (peripheral artery disease) ([facility])    s/p R fem-pop bypass 02/2020 (Dr. Donnetta Hutching)  . Paroxysmal atrial fibrillation ([facility])    CHADS-VASc=3 // noted post op after fem pop bypass in 02/2020; due to high risk, Apixaban started    [facility] Events   10/19/2020 Admission Recent admission 09/30/2020-10/01/2020 ED Visit 10/18/2020 12/6 still in shock, still volume overloaded.  Renal function and acid-base worse.  Nephrology consulted.  Starting CRRT. 12/7 minimal volume removed 12/8 looks more comfortable, still over 9 L positive, briefly tolerated pressure support ventilation, heart rate better controlled than it had been.  Overall looking better but not ready for extubation 12/9 work of breathing worse.  White blood cell count climbing, checking sputum culture.  Not able to wean Consults:  10/21/2020 PCCM  Procedures:  10/21/2020 Intubation 12/6 left IJ HD catheter placed Significant Diagnostic Tests:  CTA  10/20/2020 Interval development of extensive multifocal pulmonary infiltrate and dense consolidation of the right middle lobe most in keeping with acute infection. Small associated right parapneumonic effusion. No central obstructing lesion. Multiple bilateral pulmonary nodules are identified, but partially obscured, in keeping with pulmonary metastatic disease. Mild left ventricular dilation. No pulmonary embolism. Aortic Atherosclerosis (ICD10-I70.0).  Heart cath 01/04/20: 1. Severe multivessel coronary artery disease with chronic total occlusion of the LAD and RCA, continued patency of the left mainstem and left circumflex stents with focal mild to moderate in-stent restenosis at the transition of the left main into the circumflex.  2. Status post aortocoronary bypass surgery with continued patency of the LIMA to LAD graft and severe aorto ostial stenosis of the radial graft to PDA treated successfully with PCI using a 3.0 x 30 mm resolute Onyx DES with intravascular ultrasound  guidance 3. Nonobstructive ostial left circumflex restenosis as demonstrated by intravascular ultrasound with a minimal lumen area of 5.96 mm  Recommendations: Long-term dual antiplatelet therapy with aspirin and Effient, continued aggressive medical therapy, 2D echocardiogram for assessment of LV systolic function.   Echo 09/30/20: 1. Left ventricular ejection fraction, by estimation, is 40 to 45%. The  left ventricle has mildly decreased function. The left ventricle  demonstrates global hypokinesis. Left ventricular diastolic parameters are  consistent with Grade II diastolic  dysfunction (pseudonormalization).  2. Right ventricular systolic function is normal. The right ventricular  size is normal. There is normal pulmonary artery systolic pressure. The  estimated right ventricular systolic pressure is 37.6 mmHg.  3. Left atrial size was moderately dilated.  4. The mitral valve is normal in structure. Mild mitral valve  regurgitation. No evidence of mitral stenosis.  5. The aortic valve is tricuspid. Aortic valve regurgitation is not  visualized. Mild aortic valve stenosis. Aortic valve mean gradient  measures 11.0 mmHg.  6. The inferior vena cava is dilated in size with >50% respiratory  variability, suggesting right atrial pressure of 8 mmHg.  7. Technically difficult study with very poor images even with Definity.  For more accurate EF determination, would consider cardiac MR.   Micro Data:  10/20/2020 Blood Cx: pending 10/19/2020 Covid 19 Negative 10/19/2020 Influenza A&B Negative 12/9 respiratory culture>>> Antimicrobials:  Azithromycin 10/21/2020>> completed 5 days Rocephin 10/21/2020>> pleated 5 days  Interim history/subjective:  Patient stated breathing is much better, denies chest pain, cough or fever  Objective   Blood pressure (!) 109/56, pulse (!) 102, temperature 98.1 F (36.7 C), temperature source Oral, resp. rate 16, height 5\' 5"  (1.651 m), weight 73.3  kg, SpO2 100 %.        Intake/Output Summary (Last 24 hours) at 10/31/2020 1338 Last data filed at 10/31/2020 1200 Gross per 24 hour  Intake 529.04 ml  Output 1986 ml  Net -1456.96 ml   Filed Weights   10/29/20 0500 10/30/20 0500 10/31/20 0426  Weight: 76.8 kg 73.6 kg 73.3 kg    Examination:  General middle-age Caucasian male, lying on the bed  HEENT normocephalic, atraumatic, voice clear. Moist mucous membranes Pulmonary: Chest clear, bilateral chest tubes in place with minimal drainage Cardiac regular rate and rhythm Abdomen soft not tender Extremities trace edema, extremities are warm today, pulses are palpable. GU minimal urine Neuro: Alert, awake, follows commands.  Fully conversant.  Diffuse weakness Skin: No rash  Resolved Hospital Problem list     Assessment & Plan:  Acute hypoxic respiratory Failure in setting of extensive multifocal pulmonary infiltrates and dense consolidation of the right middle lobe working diagnosis pneumonia  Pulmonary metastasis and complicated by volume overload  Non-ST elevation MI in context of known CAD s/p CABG with severe disease in 3/4 grafts Acute on Chronic systolic HF Bilateral pleural effusion status post chest tube placement Atrial Fibrillation with RVR on amiodarone AKI due to cardiorenal syndrome Anemia w/out evidence of bleeding Poorly controlled diabetes Metastatic Colon Cancer with new involvement Lung and Liver Hyponatremia   Patient is on 4 L oxygen via nasal cannula, tolerating well We will titrated down with O2 sat goal of 92-95% MRSA screen was negative, vancomycin was discontinued Continue cefepime  Continue aspirin, Plavix and atorvastatin Once CRRT is stopped, will try Lasix challenge Monitor intake output and daily weight Bilateral pleural effusion has resolved, will discontinue chest tubes Repeat x-ray chest after removing bilateral chest tube Patient heart rate is well controlled Continue oral  amiodarone Continue IV heparin, once CRRT is stopped we will switch him to Xarelto Continue CRRT per nephrology recommendations Continue Lantus and sliding scale insulin with fingerstick goal of 140-180 Patient will be discharged to home hospice care once his respiratory status has improved He continued to have serum sodium around 130 likely due to fluid overload from acute on chronic systolic congestive heart failure Monitor electrolytes  Best practice (evaluated daily)   Diet: Renal diet Pain/Anxiety/Delirium protocol (if indicated): Oxycodone as needed VAP protocol (if indicated): N/A DVT prophylaxis: Heparin gtt GI prophylaxis: Protonix Glucose control: Basal and bolus insulin Mobility: As tolerated last date of multidisciplinary goals of care discussion: Completed once again on 12/12; NP, MD, patient and his son.  Goal is to transition to home hospice off the patient best quality of life possible. Code Status: DNR.   disposition: ICU   Total critical care time: 42 minutes  Performed by: Jacky Kindle   Critical care time was exclusive of separately billable procedures and treating other patients.   Critical care was necessary to treat or prevent imminent or life-threatening deterioration.   Critical care was time spent personally by me on the following activities: development of treatment plan with patient and/or surrogate as well as nursing, discussions with consultants, evaluation of patient's response to treatment, examination of patient, obtaining history from patient or surrogate, ordering and performing treatments and interventions, ordering and review of laboratory studies, ordering and review of radiographic studies, pulse oximetry and re-evaluation of patient's condition.   Jacky Kindle MD [facility] Pager: [phone] Mobile: [phone]

## 2020-10-31 NOTE — Progress Notes (Addendum)
Nutrition Follow-up  DOCUMENTATION CODES:   Non-severe (moderate) malnutrition in context of chronic illness  INTERVENTION:   Liberalize diet to REGULAR to allow more option.   Add Magic cup TID with meals, each supplement provides 290 kcal and 9 grams of protein for comfort  NUTRITION DIAGNOSIS:   Moderate Malnutrition related to chronic illness (colon cancer, CAD s/p CABG) as evidenced by mild fat depletion,mild muscle depletion,severe muscle depletion.  Ongoing  GOAL:   Patient will meet greater than or equal to 90% of their needs  Diet for comfort  MONITOR:   Vent status,Labs,Weight trends,TF tolerance,I & O's  REASON FOR ASSESSMENT:   Malnutrition Screening Tool    ASSESSMENT:   Ian [name] is a 64 y.o. male with PMH of severe CAD s/p CABG (2008) c/b several STEMIs (10/20 and 02/21), T2DM, GERD, HTN, HLD, metastatic colon cancer.Undergoing chemo and had prior partial colectomy. Admitted for worsening chest pain, SOB, and PNA concerns.  12/03- intubated, bronch 12/06- start CRRT 12/11- extubated   Pt discussed during ICU rounds and with RN.   CRRT to d/c once clots. Plan to try lasix. Once respiratory status improves pt will d/c home with hospice. Currently on renal diet, okay to liberalize per MD. Last meal completion charted as 100%. RD to add ice cream like supplement for comfort.   Admission weight: 88.6 kg  Current weight: 73.3 kg   CRRT: 2010 ml x 24 hrs Stool: 700 ml x 24 hrs Chest tube: 30 ml x 24 hrs   Medications: colace, SS novolog, levemir, miralax Labs: Na 132 (L) Mg 2.7 (H) CBG 95-188  Diet Order:   Diet Order            Diet renal with fluid restriction Fluid restriction: 1500 mL Fluid; Room service appropriate? Yes; Fluid consistency: Thin  Diet effective now                 EDUCATION NEEDS:   No education needs have been identified at this time  Skin:  Skin Assessment: Reviewed RN Assessment  Last BM:  12/12  Height:    Ht Readings from Last 1 Encounters:  10/20/20 5\' 5"  (1.651 m)    Weight:   Wt Readings from Last 1 Encounters:  10/31/20 73.3 kg    Ideal Body Weight:  59 kg  BMI:  Body mass index is 26.89 kg/m.  Estimated Nutritional Needs:   Kcal:  2000-2200 kcal  Protein:  100-120 grams  Fluid:  1.5 L fluid restriction   Mariana Single RD, LDN Clinical Nutrition Pager listed in [place]

## 2020-10-31 NOTE — Progress Notes (Signed)
ANTICOAGULATION CONSULT NOTE Pharmacy Consult for Heparin Indication: chest pain/ACS   Assessment: 64 y.o. male admitted with chest pain and history of atrial fibrillation on Xarelto prior to admission. Xarelto is on hold and patient transitioned to IV Heparin.   Heparin level supratherapeutic 0.75 units/ml  Goal of Therapy:  Heparin level 0.3-0.7 units/mL Monitor platelets by anticoagulation protocol: Yes   Plan:  Decrease heparin to 1200 units/hr  F/u 8hr heparin level  Erin Hearing PharmD., BCPS Clinical Pharmacist 10/31/2020 12:22 AM

## 2020-10-31 NOTE — Progress Notes (Signed)
IVT notes: Spoke to [employer]; informed cvl blue port occluded and unable to declot.Cathflo order dc'd. Suggest MD order to dc line if not in use. Patient with other access.

## 2020-11-01 ENCOUNTER — Other Ambulatory Visit: Payer: Federal, State, Local not specified - PPO

## 2020-11-01 DIAGNOSIS — R072 Precordial pain: Secondary | ICD-10-CM

## 2020-11-01 DIAGNOSIS — N179 Acute kidney failure, unspecified: Secondary | ICD-10-CM

## 2020-11-01 LAB — CULTURE, BODY FLUID W GRAM STAIN -BOTTLE
Culture: NO GROWTH
Culture: NO GROWTH

## 2020-11-01 LAB — RENAL FUNCTION PANEL
Albumin: 2.4 g/dL — ABNORMAL LOW (ref 3.5–5.0)
Anion gap: 12 (ref 5–15)
BUN: 28 mg/dL — ABNORMAL HIGH (ref 8–23)
CO2: 24 mmol/L (ref 22–32)
Calcium: 8.8 mg/dL — ABNORMAL LOW (ref 8.9–10.3)
Chloride: 98 mmol/L (ref 98–111)
Creatinine, Ser: 1.78 mg/dL — ABNORMAL HIGH (ref 0.61–1.24)
GFR, Estimated: 42 mL/min — ABNORMAL LOW (ref >60)
Glucose, Bld: 82 mg/dL (ref 70–99)
Phosphorus: 3.1 mg/dL (ref 2.5–4.6)
Potassium: 3.3 mmol/L — ABNORMAL LOW (ref 3.5–5.1)
Sodium: 134 mmol/L — ABNORMAL LOW (ref 135–145)

## 2020-11-01 LAB — GLUCOSE, CAPILLARY: Glucose-Capillary: 97 mg/dL (ref 70–99)

## 2020-11-01 LAB — HEPARIN LEVEL (UNFRACTIONATED): Heparin Unfractionated: 0.43 IU/mL (ref 0.30–0.70)

## 2020-11-01 LAB — MAGNESIUM: Magnesium: 2.6 mg/dL — ABNORMAL HIGH (ref 1.7–2.4)

## 2020-11-01 MED ORDER — ROSUVASTATIN CALCIUM 10 MG PO TABS: 10.0000 mg | ORAL_TABLET | Freq: Every day | ORAL | 0 refills | Status: AC

## 2020-11-01 MED ORDER — POTASSIUM CHLORIDE CRYS ER 20 MEQ PO TBCR
20.0000 meq | EXTENDED_RELEASE_TABLET | Freq: Once | ORAL | Status: AC
  Administered 2020-11-01: 09:00:00 20 meq via ORAL
  Filled 2020-11-01: qty 1

## 2020-11-01 MED ORDER — CARVEDILOL 3.125 MG PO TABS: 3.1250 mg | ORAL_TABLET | Freq: Two times a day (BID) | ORAL | 2 refills | Status: AC

## 2020-11-01 MED ORDER — OXYCODONE HCL 5 MG PO TABS
5.0000 mg | ORAL_TABLET | Freq: Four times a day (QID) | ORAL | 0 refills | Status: AC | PRN
Start: 2020-11-01 — End: ?

## 2020-11-01 MED ORDER — AMIODARONE HCL 200 MG PO TABS: 200.0000 mg | ORAL_TABLET | Freq: Every day | ORAL | 2 refills | Status: AC

## 2020-11-01 NOTE — Progress Notes (Signed)
ANTICOAGULATION CONSULT NOTE Pharmacy Consult for Heparin Indication: chest pain/ACS   Assessment: 64 y.o. male admitted with chest pain and history of atrial fibrillation on Xarelto prior to admission. Apixaban is on hold and patient transitioned to IV Heparin.   Heparin level came back therapeutic at 0.43, on 1200 units/hr. No s/sx of bleeding or infusion issues.   Goal of Therapy:  Heparin level 0.3-0.7 units/mL Monitor platelets by anticoagulation protocol: Yes   Plan:  Will stop heparin infusion  Plan for discharge to home hospice - discussed with CCM and cardiology, will send out on DAPT without apixaban  Ian [name], PharmD, [place] Pharmacist  Phone: [phone] 11/01/2020 7:18 AM  Please check AMION for all [place] phone numbers After 10:00 PM, call [place] [phone]

## 2020-11-01 NOTE — Progress Notes (Signed)
Patient ID: Ian [name], male   DOB: [birth year], 64 y.o.   MRN: [id number] [facility] Progress Note   Assessment/ Plan:   1. Acute kidney Injury: Secondary to acute cardiorenal syndrome versus ATN-started on CRRT for volume unloading after worsening renal function with attempted diuresis.  He is not a candidate for chronic outpatient hemodialysis based on his intermittent hypotension/multiple comorbidities; the plan is to transition to home hospice upon discharge.  Renal service will sign off at this time and remain available for questions or concerns. 2.  Acute hypoxic respiratory failure: Thought to be multifactorial from underlying pneumonia, pulmonary metastasis (metastatic colon cancer) and volume overload/CHF exacerbation.  Started on CRRT for volume unloading after worsening renal function with attempted diuresis. 3.  Hyponatremia: Multifactorial in the setting of worsening renal insufficiency, metastatic cancer and CHF exacerbation. 4.  Atrial fibrillation with rapid ventricular response: On oral amiodarone and low-dose carvedilol; currently sinus tachycardia.  Plans noted to restart Xarelto at some point. 5.  Metastatic colon cancer: With hepatic and pulmonary metastasis.  Avoiding ESA. 6.  Hypokalemia: We will give low-dose oral potassium supplement.  Subjective:   Denies any complaints at this time.  CRRT discontinued at some point overnight.   Objective:   BP (!) 104/56   Pulse (!) 104   Temp 98 F (36.7 C) (Oral)   Resp 15   Ht 5\' 5"  (1.651 m)   Wt 73.3 kg   SpO2 98%   BMI 26.89 kg/m   Intake/Output Summary (Last 24 hours) at 11/01/2020 0745 Last data filed at 11/01/2020 0700 Gross per 24 hour  Intake 924.13 ml  Output 2034 ml  Net -1109.87 ml   Weight change:   Physical Exam: Gen: Comfortably resting in bed, nurse at bedside. CVS: Pulse regular tachycardia, S1 and S2 normal without murmur/rub Resp: Poor inspiratory effort, diminished breath  sounds over bases.  No distinct rales or rhonchi Abd: Soft, flat, nontender Ext: No lower extremity edema  Imaging: DG CHEST PORT 1 VIEW  Result Date: 10/31/2020 CLINICAL DATA:  Chest tube removal EXAM: PORTABLE CHEST 1 VIEW COMPARISON:  10/31/2020, 10/28/2020, CT chest 10/06/2020 FINDINGS: Post sternotomy changes. Left-sided central venous port tip over the SVC. Right jugular venous catheter tip over the SVC, left jugular venous catheter tip over the right atrium. Removal of bilateral chest tubes. No definitive pneumothorax is seen. No focal airspace disease or effusion. Bilateral lung nodules most evident in the left lung apex corresponding to history of metastatic disease. IMPRESSION: Removal of bilateral chest tubes. No definitive pneumothorax. Pulmonary nodules, corresponding to history of metastatic disease. Electronically Signed   By: Donavan Foil M.D.   On: 10/31/2020 16:11   DG CHEST PORT 1 VIEW  Result Date: 10/31/2020 CLINICAL DATA:  Pleural effusion due to CHF. Chest tube present, shortness of breath, pleural effusion, CHF. EXAM: PORTABLE CHEST 1 VIEW COMPARISON:  Prior chest radiographs 10/28/2020 and earlier. CT chest 10/20/2020. FINDINGS: Interval extubation. Unchanged position of right and left IJ approach central venous catheters and of a left chest infusion port catheter. Unchanged position of bilateral chest tubes. Prior median sternotomy/CABG. Prior coronary artery stenting. Mild cardiomegaly, unchanged. Aortic atherosclerosis. Central pulmonary vascular congestion. Similar appearance of subtle bilateral nodular airspace opacities and more ill-defined airspace disease within the right lung base. No sizable pleural effusion or evidence of pneumothorax. No acute bony abnormality identified. IMPRESSION: Support apparatus as described.  No evidence of pneumothorax. Mild cardiomegaly with central pulmonary vascular congestion, unchanged. Similar appearance of  subtle bilateral nodular  airspace opacities and more ill-defined airspace disease within the right lung base. Aortic Atherosclerosis (ICD10-I70.0). Electronically Signed   By: Kellie Simmering DO   On: 10/31/2020 08:05    Labs: BMET Recent Labs  Lab 10/29/20 0451 10/29/20 1821 10/30/20 0334 10/30/20 1614 10/31/20 0350 10/31/20 1645 11/01/20 0500  NA 133* 134* 133* 131* 132* 129* 134*  K 4.2 4.7 5.1 4.1 3.7 3.4* 3.3*  CL 98 97* 96* 94* 95* 97* 98  CO2 22 22 21* 22 23 22 24   GLUCOSE 226* 128* 175* 185* 110* 307* 82  BUN 42* 37* 34* 32* 28* 26* 28*  CREATININE 1.52* 1.62* 1.86* 1.78* 1.78* 1.67* 1.78*  CALCIUM 8.9 9.3 9.5 8.8* 9.0 8.2* 8.8*  PHOS 3.1 3.8 4.0 3.8 3.8 3.0 3.1   CBC Recent Labs  Lab 10/26/20 0401 10/27/20 0404 10/28/20 0433 10/29/20 0451  WBC 14.1* 16.0* 18.0* 18.5*  HGB 7.3* 7.5* 7.2* 8.3*  HCT 22.0* 23.2* 22.5* 25.1*  MCV 76.7* 78.1* 78.4* 79.4*  PLT 257 328 306 354    Medications:    . amiodarone  200 mg Oral Daily  . aspirin  81 mg Oral Daily  . B-complex with vitamin C  1 tablet Oral Daily  . carvedilol  3.125 mg Oral BID WC  . Chlorhexidine Gluconate Cloth  6 each Topical Daily  . clopidogrel  75 mg Oral Daily  . docusate sodium  100 mg Oral BID  . guaiFENesin  20 mL Oral Q8H  . insulin aspart  0-20 Units Subcutaneous TID WC & HS  . insulin detemir  12 Units Subcutaneous BID  . levalbuterol  0.63 mg Nebulization TID  . pantoprazole  40 mg Oral Daily  . polyethylene glycol  17 g Oral Daily  . rosuvastatin  10 mg Oral QHS   Elmarie Shiley, MD 11/01/2020, 7:45 AM

## 2020-11-01 NOTE — Progress Notes (Signed)
eLink Physician-Brief Progress Note Patient Name: Ian [name] DOB: 1956/10/27 MRN: [id number]   Date of Service  11/01/2020  HPI/Events of Note  R IJ CVL is 76 days old and no longer needed. Nursing request to remove R IJ CVL.  eICU Interventions  Remove R IJ CVL.     Intervention Category Major Interventions: Other:  Carmita Boom Cornelia Copa 11/01/2020, 5:08 AM

## 2020-11-01 NOTE — Plan of Care (Signed)
  Problem: Education: Goal: Knowledge of General Education information will improve Description: Including pain rating scale, medication(s)/side effects and non-pharmacologic comfort measures Outcome: Adequate for Discharge   Problem: Health Behavior/Discharge Planning: Goal: Ability to manage health-related needs will improve Outcome: Adequate for Discharge   Problem: Clinical Measurements: Goal: Ability to maintain clinical measurements within normal limits will improve Outcome: Adequate for Discharge Goal: Diagnostic test results will improve Outcome: Adequate for Discharge Goal: Respiratory complications will improve Outcome: Adequate for Discharge Goal: Cardiovascular complication will be avoided Outcome: Adequate for Discharge   Problem: Activity: Goal: Risk for activity intolerance will decrease Outcome: Adequate for Discharge   Problem: Nutrition: Goal: Adequate nutrition will be maintained Outcome: Adequate for Discharge   Problem: Coping: Goal: Level of anxiety will decrease Outcome: Adequate for Discharge   Problem: Elimination: Goal: Will not experience complications related to bowel motility Outcome: Adequate for Discharge   Problem: Pain Managment: Goal: General experience of comfort will improve Outcome: Adequate for Discharge   Problem: Skin Integrity: Goal: Risk for impaired skin integrity will decrease Outcome: Adequate for Discharge

## 2020-11-01 NOTE — Discharge Summary (Addendum)
Physician Discharge Summary   Patient ID: Ian [name] MRN: [id number] DOB/AGE: 19-Mar-1956 64 y.o.  Admit date: 10/19/2020 Discharge date: 11/01/2020                     Discharge Plan by Diagnosis   A.fib with RVR (RVR resolved), CAD s/p CABG 2008, ICM, sCHF, HTN, HLD. - Continue Amiodarone, Coreg, ASA, Plavix, Crestor. - Discontinue Apixaban, Diltiazem, Ezetimibe - Simvastatin, HCTZ, Imdur, Toprol-XL, Ramipril, Ranolazine  Metastatic Colon Cancer with newly found hepatic and pulmonary mets. - No further interventions, pt to be discharged home with home hospice.  Multifocal PNA - s/p 5 days abx. - No further interventions.  AKI 2/2 cardiorenal syndrome - s/p course CRRT, not a candidate for ongoing HD. - No further interventions.  Hx DM. - Continue home meds (holding metformin for now).  Sepsis was ruled out    Discharge Summary   Ian [name] is a 64 y.o. y/o male with a PMH of CAD s/p CABG (2008) c/b several STEMIs (08/2019 s/p DES x2 to L radial-PDA with DES x 2 to protected LM into LCx;repeatSTEMI in 12/2019 s/p DES to L radial-PDA), ICM (EF 40-45%), DMII (A1c 13.2%), GERD, HTN, HLD, PAD s/p R fempop bypass (02/2020), paroxysmal AF (on DOAC),andmetastatic colon cancer(newly found hepatic and pulmonary mets with history of prior mets to liver s/p partial colectomy andchemotherapy) who presented to the ED on 12/2 for evaluation of progressive chest pain and shortness of breath.   He was admitted by the cardiology service. CTA was done which was + for extensive multifocal pulmonary infiltrate and dense consolidation of the right middle lobe with small associated right parapneumonic effusion.  He was initially placed on BiPAP but failed and required intubation 12/3.  Unfortunately he had increasing PEEP and O2 requirements along with volume overload in setting worsening renal failure felt to be 2/2 cardiorenal syndrome.  Nephrology was consulted 12/6 and CRRT was  started.  He was also started on Amio for A.fib with RVR.  He had persistent left effusion which was felt to likely affect likelihood of successful extubation.  He subsequently had chest tube placed 12/9 to facilitate drainage.  Chest tube clamped 12/12 and pulled 12/13.  12/11, he was liberated from the vent successfully .   Due to unfavorable trajectory with poor prognosis, family opted to try and optimize him and get him home with home with hospice when able.  On 12/14, he was deemed medically stable and was cleared for discharge home.  [facility]         Significant Hospital Events   12/2 > admit. 12/3 > intubated, bronch. 12/6 > CRRT started. 12/9 > left chest tube placed for effusion. 12/11 > extubated. 12/13 > CRRT stopped. 12/13 > L chest tube pulled. 12/14 > discharged home  Micro Data  Blood 12/2 > neg. COVID 12/2 > neg. Flu 12/2 > neg. Sputum 12/9 > neg.  Antimicrobials  Azithro 12/3 x 5 days. Ceftriaxone 12/3 x 5 days.  [place]  Cardiology, Nephrology, Palliative Medicine.  Objective:  Blood pressure (!) 104/56, pulse (!) 104, temperature 98.3 F (36.8 C), temperature source Oral, resp. rate 15, height 5' 5" (1.651 m), weight 73.3 kg, SpO2 98 %.        Intake/Output Summary (Last 24 hours) at 11/01/2020 1109 Last data filed at 11/01/2020 0700 Gross per 24 hour  Intake 876.93 ml  Output 1820 ml  Net -943.07 ml  Filed Weights   10/29/20 0500 10/30/20 0500 10/31/20 0426  Weight: 76.8 kg 73.6 kg 73.3 kg    Physical Examination: General: Adult male, sitting on edge of bed resting comfortably, in NAD. Neuro: A&O x 3, non-focal.  HEENT: [place]/AT. EOMI, sclerae anicteric. Cardiovascular: Tachy, IRIR, no M/R/G.  Lungs: Respirations even and unlabored.  CTA bilaterally, No W/R/R. Abdomen: BS x 4, soft, NT/ND.  Musculoskeletal: No gross deformities, no edema.  Skin: Intact, warm, no rashes.   Discharge  Labs:  BMET Recent Labs  Lab 10/28/20 0433 10/28/20 1501 10/29/20 0451 10/29/20 1821 10/30/20 0334 10/30/20 1614 10/31/20 0350 10/31/20 1645 11/01/20 0500  NA 134*   < > 133*   < > 133* 131* 132* 129* 134*  K 4.4   < > 4.2   < > 5.1 4.1 3.7 3.4* 3.3*  CL 98   < > 98   < > 96* 94* 95* 97* 98  CO2 21*   < > 22   < > 21* 22 23 22 24  GLUCOSE 269*   < > 226*   < > 175* 185* 110* 307* 82  BUN 45*   < > 42*   < > 34* 32* 28* 26* 28*  CREATININE 1.78*   < > 1.52*   < > 1.86* 1.78* 1.78* 1.67* 1.78*  CALCIUM 8.4*   < > 8.9   < > 9.5 8.8* 9.0 8.2* 8.8*  MG 2.9*  --  2.9*  --  2.9*  --  2.7*  --  2.6*  PHOS 3.2   < > 3.1   < > 4.0 3.8 3.8 3.0 3.1   < > = values in this interval not displayed.    CBC Recent Labs  Lab 10/27/20 0404 10/28/20 0433 10/29/20 0451  HGB 7.5* 7.2* 8.3*  HCT 23.2* 22.5* 25.1*  WBC 16.0* 18.0* 18.5*  PLT 328 306 354    Anti-Coagulation No results for input(s): INR in the last 168 hours.  Discharge Instructions    Diet - low sodium heart healthy   Complete by: As directed    Increase activity slowly   Complete by: As directed        Allergies as of 11/01/2020      Reactions   Anectine [succinylcholine] Other (See Comments)   Atypical pseudocholinesterase deficiency   Nsaids Other (See Comments)   Contraindicated (can only have asa 81 mg)      Medication List    STOP taking these medications   apixaban 5 MG Tabs tablet Commonly known as: ELIQUIS   diltiazem 360 MG 24 hr capsule Commonly known as: CARDIZEM CD   ezetimibe-simvastatin 10-80 MG tablet Commonly known as: VYTORIN   hydrochlorothiazide 25 MG tablet Commonly known as: HYDRODIURIL   isosorbide mononitrate 30 MG 24 hr tablet Commonly known as: IMDUR   isosorbide mononitrate 60 MG 24 hr tablet Commonly known as: IMDUR   metFORMIN 500 MG tablet Commonly known as: GLUCOPHAGE   metoprolol succinate 100 MG 24 hr tablet Commonly known as: TOPROL-XL   ramipril 10 MG  capsule Commonly known as: ALTACE   ranolazine 500 MG 12 hr tablet Commonly known as: RANEXA     TAKE these medications   amiodarone 200 MG tablet Commonly known as: PACERONE Take 1 tablet (200 mg total) by mouth daily.   aspirin EC 81 MG tablet Take 81 mg by mouth in the morning. Swallow whole.   Basaglar KwikPen 100 UNIT/ML Inject 40 Units into the skin   at bedtime.   carvedilol 3.125 MG tablet Commonly known as: COREG Take 1 tablet (3.125 mg total) by mouth 2 (two) times daily with a meal.   clopidogrel 75 MG tablet Commonly known as: PLAVIX Take 75 mg by mouth daily. What changed: Another medication with the same name was removed. Continue taking this medication, and follow the directions you see here.   CoQ10 100 MG Caps Take 100 mg by mouth daily.   diphenhydramine-acetaminophen 25-500 MG Tabs tablet Commonly known as: TYLENOL PM Take 2 tablets by mouth at bedtime.   Farxiga 10 MG Tabs tablet Generic drug: dapagliflozin propanediol Take 10 mg by mouth daily.   icosapent Ethyl 1 g capsule Commonly known as: VASCEPA Take 2 capsules (2 g total) by mouth 2 (two) times daily.   Januvia 100 MG tablet Generic drug: sitaGLIPtin Take 100 mg by mouth daily.   nitroGLYCERIN 0.4 MG SL tablet Commonly known as: NITROSTAT Place 1 tablet (0.4 mg total) under the tongue every 5 (five) minutes as needed for chest pain.   NovoLOG Mix 70/30 FlexPen (70-30) 100 UNIT/ML FlexPen Generic drug: insulin aspart protamine - aspart Inject 20 Units into the skin in the morning, at noon, and at bedtime.   oxyCODONE 5 MG immediate release tablet Commonly known as: Oxy IR/ROXICODONE Take 1 tablet (5 mg total) by mouth every 6 (six) hours as needed for moderate pain or severe pain.   pantoprazole 40 MG tablet Commonly known as: PROTONIX Take 40 mg by mouth daily.   Repatha SureClick 140 MG/ML Soaj Generic drug: Evolocumab Inject 140 mg into the skin every 14 (fourteen) days.    rosuvastatin 10 MG tablet Commonly known as: Crestor Take 1 tablet (10 mg total) by mouth at bedtime. What changed:   medication strength  how much to take        Disposition: Home with home hospice.   Discharge Condition:  Ian [name] has met maximum benefit of inpatient care and is medically stable and cleared for discharge with home hospice.  He is active with [facility].  Time spent on discharge: Greater than 35 minutes.   Rahul Desai, PA - C  Pulmonary & Critical Care Medicine 11/01/2020, 11:09 AM     

## 2020-11-01 NOTE — Progress Notes (Signed)
Progress Note  Patient Name: Ian [name] Date of Encounter: 11/01/2020  [facility] Cardiologist: Sherren Mocha, MD   Subjective   Feels well. Denies chest pain or dyspnea. Anxious to go home.  Inpatient Medications    Scheduled Meds: . amiodarone  200 mg Oral Daily  . aspirin  81 mg Oral Daily  . B-complex with vitamin C  1 tablet Oral Daily  . carvedilol  3.125 mg Oral BID WC  . Chlorhexidine Gluconate Cloth  6 each Topical Daily  . clopidogrel  75 mg Oral Daily  . docusate sodium  100 mg Oral BID  . guaiFENesin  20 mL Oral Q8H  . insulin aspart  0-20 Units Subcutaneous TID WC & HS  . insulin detemir  12 Units Subcutaneous BID  . levalbuterol  0.63 mg Nebulization TID  . pantoprazole  40 mg Oral Daily  . polyethylene glycol  17 g Oral Daily  . potassium chloride  20 mEq Oral Once  . rosuvastatin  10 mg Oral QHS   Continuous Infusions: . ceFEPime (MAXIPIME) IV 200 mL/hr at 10/31/20 2200  . heparin Stopped (11/01/20 0650)   PRN Meds: acetaminophen (TYLENOL) oral liquid 160 mg/5 mL, heparin, LORazepam, metoprolol tartrate, ondansetron (ZOFRAN) IV, oxyCODONE, sodium chloride, sodium chloride flush   Vital Signs    Vitals:   11/01/20 0600 11/01/20 0700 11/01/20 0731 11/01/20 0805  BP: (!) 88/55 (!) 104/56    Pulse: 99 (!) 104    Resp: (!) 22 15    Temp:    98.3 F (36.8 C)  TempSrc:    Oral  SpO2: 97% 98% 98%   Weight:      Height:        Intake/Output Summary (Last 24 hours) at 11/01/2020 0807 Last data filed at 11/01/2020 0700 Gross per 24 hour  Intake 912.93 ml  Output 1928 ml  Net -1015.07 ml   Last 3 Weights 10/31/2020 10/30/2020 10/29/2020  Weight (lbs) 161 lb 9.6 oz 162 lb 4.1 oz 169 lb 5 oz  Weight (kg) 73.3 kg 73.6 kg 76.8 kg      Telemetry    Sinus rhythm- Personally Reviewed  ECG    EKG none today- Personally Reviewed  Physical Exam   General: awake conversant  HEENT: lines out SKIN: warm, dry. Neck: No JVD Lungs:clear  anteriorly Cardiovascular: Regular rate and rhythm. No murmur or gallop Abdomen:Soft. Bowel sounds present.  Extremities: No lower extremity edema.   Labs    High Sensitivity Troponin:   Recent Labs  Lab 10/20/20 0121 10/20/20 0324 10/20/20 0608 10/23/20 2345 10/24/20 0210  TROPONINIHS 1,386* 4,619* 5,929* 4,207* 3,736*      Chemistry Recent Labs  Lab 10/31/20 0350 10/31/20 1645 11/01/20 0500  NA 132* 129* 134*  K 3.7 3.4* 3.3*  CL 95* 97* 98  CO2 23 22 24   GLUCOSE 110* 307* 82  BUN 28* 26* 28*  CREATININE 1.78* 1.67* 1.78*  CALCIUM 9.0 8.2* 8.8*  ALBUMIN 2.5* 2.4* 2.4*  GFRNONAA 42* 46* 42*  ANIONGAP 14 10 12      Hematology Recent Labs  Lab 10/27/20 0404 10/28/20 0433 10/29/20 0451  WBC 16.0* 18.0* 18.5*  RBC 2.97* 2.87* 3.16*  HGB 7.5* 7.2* 8.3*  HCT 23.2* 22.5* 25.1*  MCV 78.1* 78.4* 79.4*  MCH 25.3* 25.1* 26.3  MCHC 32.3 32.0 33.1  RDW 18.0* 17.9* 18.3*  PLT 328 306 354    BNP Recent Labs  Lab 10/30/20 1049  BNP 484.0*  DDimer No results for input(s): DDIMER in the last 168 hours.   Radiology    DG CHEST PORT 1 VIEW  Result Date: 10/31/2020 CLINICAL DATA:  Chest tube removal EXAM: PORTABLE CHEST 1 VIEW COMPARISON:  10/31/2020, 10/28/2020, CT chest 10/06/2020 FINDINGS: Post sternotomy changes. Left-sided central venous port tip over the SVC. Right jugular venous catheter tip over the SVC, left jugular venous catheter tip over the right atrium. Removal of bilateral chest tubes. No definitive pneumothorax is seen. No focal airspace disease or effusion. Bilateral lung nodules most evident in the left lung apex corresponding to history of metastatic disease. IMPRESSION: Removal of bilateral chest tubes. No definitive pneumothorax. Pulmonary nodules, corresponding to history of metastatic disease. Electronically Signed   By: Donavan Foil M.D.   On: 10/31/2020 16:11   DG CHEST PORT 1 VIEW  Result Date: 10/31/2020 CLINICAL DATA:  Pleural  effusion due to CHF. Chest tube present, shortness of breath, pleural effusion, CHF. EXAM: PORTABLE CHEST 1 VIEW COMPARISON:  Prior chest radiographs 10/28/2020 and earlier. CT chest 10/20/2020. FINDINGS: Interval extubation. Unchanged position of right and left IJ approach central venous catheters and of a left chest infusion port catheter. Unchanged position of bilateral chest tubes. Prior median sternotomy/CABG. Prior coronary artery stenting. Mild cardiomegaly, unchanged. Aortic atherosclerosis. Central pulmonary vascular congestion. Similar appearance of subtle bilateral nodular airspace opacities and more ill-defined airspace disease within the right lung base. No sizable pleural effusion or evidence of pneumothorax. No acute bony abnormality identified. IMPRESSION: Support apparatus as described.  No evidence of pneumothorax. Mild cardiomegaly with central pulmonary vascular congestion, unchanged. Similar appearance of subtle bilateral nodular airspace opacities and more ill-defined airspace disease within the right lung base. Aortic Atherosclerosis (ICD10-I70.0). Electronically Signed   By: Kellie Simmering DO   On: 10/31/2020 08:05    Cardiac Studies   Echo 10/22/20:  1. Left ventricular ejection fraction, by estimation, is 30 to 35%. The  left ventricle has moderately decreased function. The left ventricle  demonstrates regional wall motion abnormalities (see scoring  diagram/findings for description). There is mild  left ventricular hypertrophy. Left ventricular diastolic parameters are  consistent with Grade III diastolic dysfunction (restrictive). Elevated  left atrial pressure.  2. Right ventricular systolic function is normal. The right ventricular  size is normal.  3. The mitral valve is normal in structure. Trivial mitral valve  regurgitation.  4. The aortic valve is calcified. There is moderate calcification of the  aortic valve. Aortic valve regurgitation is not visualized. Mild  aortic  valve stenosis.    Patient Profile     64 y.o. male with history of severe multi-vessel CAD s/p CABG and multiple prior PCIs/stents, metastatic colon cancer with pulmonary and hepatic mets, ischemic cardiomyopathy admitted with chest pain and dyspnea and found to have pneumonia, elevated troponin, atrial fib with RVR.   Assessment & Plan    1. Atrial fib with RVR: sinus in the last 5 days. Now on po amiodarone. On very low dose Coreg.  He was on Xarelto at home. Given poor prognosis I think it is reasonable to discontinue anticoagulation now and just continue ASA and Plavix.    2. CAD s/p CABG/Elevated troponin 5900: He has severe CAD. Troponin elevation in the setting of pneumonia and hypotension. LVEF is slightly worse EF 30-35%. No plans for further invasive cardiac evaluation at this time.  -Continue ASA, Plavix and statin.     3. Acute hypoxic respiratory failure with extensive multi-focal pulmonary infiltrates and pneumonia. Extubated  and doing well. CXR improved.   4. Acute on chronic systolic CHF: EF 44-81%. Volume removal by CRRT, no signs of fluid overload on exam, medical therapy for CHF limited by low BP   5. Acute renal failure: Nephrology following 1.78->1.64-> 1.52->1.86>> 1.78  Plan for DC  home today with [place] will sign off.   Medication Recommendations:  As per [facility] Other recommendations (labs, testing, etc):  none Follow up as an outpatient:  None. Home on Hospice   For questions or updates, please contact [place] Please consult [email or web address] for contact info under   Signed, Marcheta Horsey [place], MD  11/01/2020, 8:07 AM

## 2020-11-01 NOTE — TOC Transition Note (Signed)
Transition of Care [facility]) - CM/SW Discharge Note   Patient Details  Name: Ian [name] MRN: [id number] Date of Birth: [birth year]  [facility] of [facility]) CM/SW Contact:  Sharin Mons, RN Phone Number: [phone] 11/01/2020, 10:11 AM     Clinical Narrative:     Anticipated DC date: 11/01/2020 Family notified: wifeHelene [name]) and son Ian [name]) Transport by: son Ian [name]), car  -Metastatic Colon Cancer with newly found hepatic and pulmonary mets Per MD patient ready for DC today . RN, patient, patient's wife Ian [name] and [facility]/ Cassandra aware of DC. Cassandra to f/u with wife to setup intake assessment for today.  Pt insists on son Ian [name] to provide transportation to home by car.      DNR form will given to pt with d/c instructions by nurse.Ian [name] (Spouse)     [phone]       RNCM will sign off for now as intervention is no longer needed. Please consult [place] again if new needs arise.    Final next level of care: Home w Hospice Care Barriers to Discharge: No Barriers Identified   Patient Goals and CMS Choice     Choice offered to / list presented to : Spouse  Discharge Placement                       Discharge Plan and Services   Discharge Planning Services: CM Consult Post Acute Care Choice: Hospice            DME Agency: NA       HH Arranged: NA          Social Determinants of Health (SDOH) Interventions     Readmission Risk Interventions Readmission Risk Prevention Plan 09/15/2019  Transportation Screening Complete  PCP or Specialist Appt within 3-5 Days Not Complete  Not Complete comments continual medical workup  HRI or [place] Complete  Social Work Consult for [place] Planning/Counseling Complete  Palliative Care Screening Not Applicable  Medication Review [employer] photographer) Complete  Some recent data might be hidden

## 2020-11-19 DEATH — deceased

## 2020-12-09 ENCOUNTER — Ambulatory Visit: Payer: Federal, State, Local not specified - PPO | Admitting: Cardiovascular Disease

## 2021-03-25 IMAGING — DX DG CHEST 1V PORT
1 series · 1 of 1 positions shown · non-contrast
Comparison: Prior chest radiographs 10/28/2020 and earlier. CT
chest 10/20/2020.

CLINICAL DATA: Pleural effusion due to CHF. Chest tube present,
shortness of breath, pleural effusion, CHF.

EXAM:
PORTABLE CHEST 1 VIEW

[chest ap]
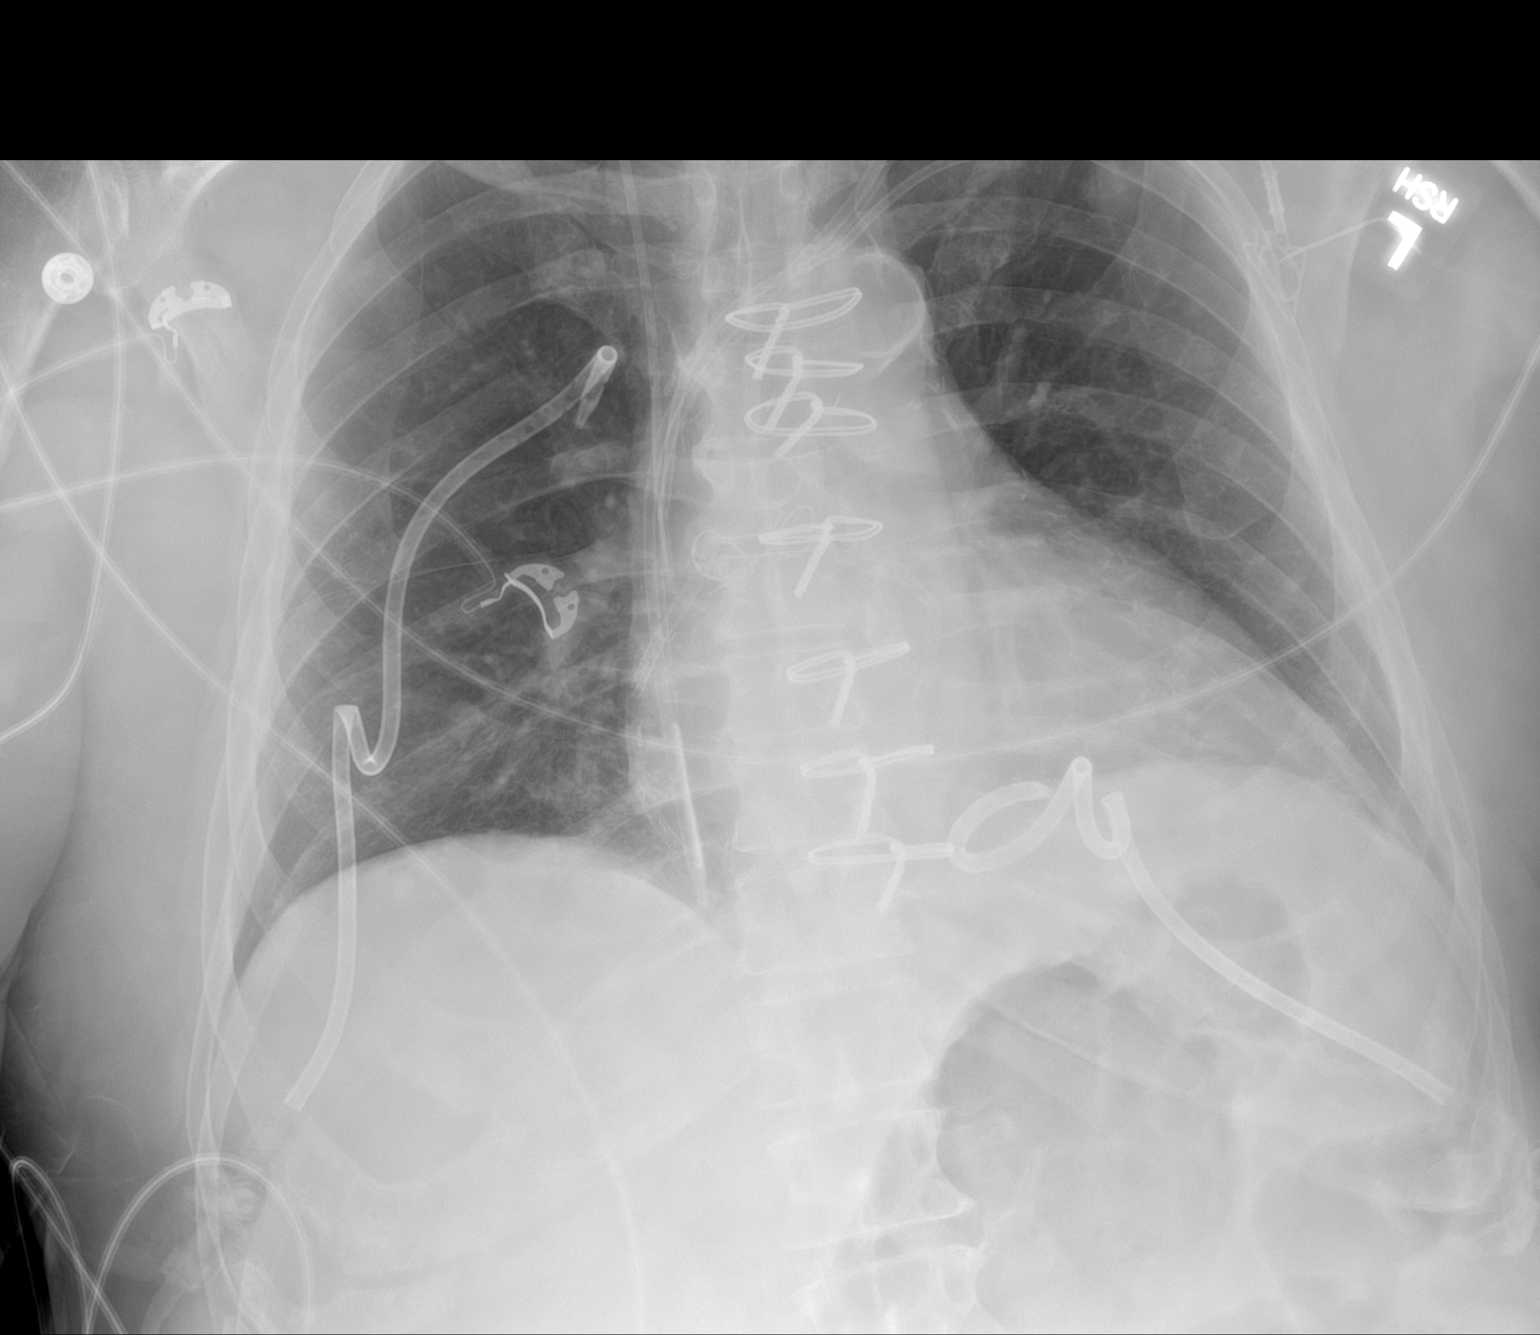

[1 of 1 positions shown; findings below may reference images not displayed]

FINDINGS: Interval extubation. Unchanged position of right and left IJ
approach central venous catheters and of a left chest infusion port
catheter. Unchanged position of bilateral chest tubes. Prior median
sternotomy/CABG. Prior coronary artery stenting. Mild cardiomegaly,
unchanged. Aortic atherosclerosis. Central pulmonary vascular
congestion. Similar appearance of subtle bilateral nodular airspace
opacities and more ill-defined airspace disease within the right
lung base. No sizable pleural effusion or evidence of pneumothorax.
No acute bony abnormality identified.
IMPRESSION: Support apparatus as described.  No evidence of pneumothorax.

Mild cardiomegaly with central pulmonary vascular congestion,
unchanged.

Similar appearance of subtle bilateral nodular airspace opacities
and more ill-defined airspace disease within the right lung base.

Aortic Atherosclerosis ([id number]-6RS.S).
